# Patient Record
Sex: Male | Born: 1940
Health system: Southern US, Community
[De-identification: ages and names within clinical notes are randomized; demographics above are authoritative.]

## PROBLEM LIST (undated history)

## (undated) DIAGNOSIS — IMO0001 Reserved for inherently not codable concepts without codable children: Secondary | ICD-10-CM

## (undated) DIAGNOSIS — M899 Disorder of bone, unspecified: Secondary | ICD-10-CM

## (undated) DIAGNOSIS — M1990 Unspecified osteoarthritis, unspecified site: Secondary | ICD-10-CM

## (undated) DIAGNOSIS — N179 Acute kidney failure, unspecified: Secondary | ICD-10-CM

## (undated) DIAGNOSIS — F0390 Unspecified dementia without behavioral disturbance: Secondary | ICD-10-CM

## (undated) DIAGNOSIS — G609 Hereditary and idiopathic neuropathy, unspecified: Secondary | ICD-10-CM

## (undated) DIAGNOSIS — E785 Hyperlipidemia, unspecified: Secondary | ICD-10-CM

## (undated) DIAGNOSIS — E559 Vitamin D deficiency, unspecified: Secondary | ICD-10-CM

## (undated) DIAGNOSIS — I10 Essential (primary) hypertension: Secondary | ICD-10-CM

## (undated) DIAGNOSIS — M109 Gout, unspecified: Secondary | ICD-10-CM

## (undated) DIAGNOSIS — F22 Delusional disorders: Secondary | ICD-10-CM

## (undated) DIAGNOSIS — F17200 Nicotine dependence, unspecified, uncomplicated: Secondary | ICD-10-CM

## (undated) DIAGNOSIS — R42 Dizziness and giddiness: Secondary | ICD-10-CM

## (undated) DIAGNOSIS — I959 Hypotension, unspecified: Secondary | ICD-10-CM

## (undated) DIAGNOSIS — D649 Anemia, unspecified: Secondary | ICD-10-CM

## (undated) DIAGNOSIS — R413 Other amnesia: Secondary | ICD-10-CM

## (undated) DIAGNOSIS — E1165 Type 2 diabetes mellitus with hyperglycemia: Secondary | ICD-10-CM

## (undated) DIAGNOSIS — G709 Myoneural disorder, unspecified: Secondary | ICD-10-CM

## (undated) DIAGNOSIS — M6281 Muscle weakness (generalized): Secondary | ICD-10-CM

## (undated) DIAGNOSIS — K219 Gastro-esophageal reflux disease without esophagitis: Secondary | ICD-10-CM

## (undated) DIAGNOSIS — E876 Hypokalemia: Secondary | ICD-10-CM

## (undated) DIAGNOSIS — M949 Disorder of cartilage, unspecified: Secondary | ICD-10-CM

## (undated) DIAGNOSIS — Z136 Encounter for screening for cardiovascular disorders: Secondary | ICD-10-CM

## (undated) HISTORY — DX: Encounter for screening for cardiovascular disorders: Z13.6

## (undated) HISTORY — DX: Type 2 diabetes mellitus with hyperglycemia: E11.65

## (undated) HISTORY — DX: Muscle weakness (generalized): M62.81

## (undated) HISTORY — DX: Anemia, unspecified: D64.9

## (undated) HISTORY — DX: Reserved for inherently not codable concepts without codable children: IMO0001

## (undated) HISTORY — DX: Nicotine dependence, unspecified, uncomplicated: F17.200

## (undated) HISTORY — DX: Disorder of cartilage, unspecified: M94.9

## (undated) HISTORY — DX: Acute kidney failure, unspecified: N17.9

## (undated) HISTORY — DX: Hypotension, unspecified: I95.9

## (undated) HISTORY — DX: Other amnesia: R41.3

## (undated) HISTORY — DX: Hyperlipidemia, unspecified: E78.5

## (undated) HISTORY — DX: Hereditary and idiopathic neuropathy, unspecified: G60.9

## (undated) HISTORY — DX: Hypokalemia: E87.6

## (undated) HISTORY — DX: Dizziness and giddiness: R42

## (undated) HISTORY — DX: Disorder of bone, unspecified: M89.9

## (undated) HISTORY — DX: Vitamin D deficiency, unspecified: E55.9

## (undated) HISTORY — PX: INGUINAL HERNIA REPAIR: SUR1180

## (undated) HISTORY — DX: Gout, unspecified: M10.9

---

## 1999-09-21 ENCOUNTER — Emergency Department (HOSPITAL_COMMUNITY): Admission: EM | Admit: 1999-09-21 | Discharge: 1999-09-21 | Payer: Self-pay | Admitting: Internal Medicine

## 1999-09-23 ENCOUNTER — Encounter: Admission: RE | Admit: 1999-09-23 | Discharge: 1999-12-22 | Payer: Self-pay | Admitting: Family Medicine

## 1999-09-23 ENCOUNTER — Encounter: Payer: Self-pay | Admitting: Family Medicine

## 1999-09-23 ENCOUNTER — Encounter: Admission: RE | Admit: 1999-09-23 | Discharge: 1999-09-23 | Payer: Self-pay | Admitting: Family Medicine

## 2000-09-20 ENCOUNTER — Encounter (HOSPITAL_BASED_OUTPATIENT_CLINIC_OR_DEPARTMENT_OTHER): Payer: Self-pay | Admitting: General Surgery

## 2000-09-20 ENCOUNTER — Ambulatory Visit (HOSPITAL_COMMUNITY): Admission: RE | Admit: 2000-09-20 | Discharge: 2000-09-20 | Payer: Self-pay | Admitting: General Surgery

## 2001-02-13 ENCOUNTER — Emergency Department (HOSPITAL_COMMUNITY): Admission: EM | Admit: 2001-02-13 | Discharge: 2001-02-13 | Payer: Self-pay | Admitting: *Deleted

## 2001-02-27 ENCOUNTER — Ambulatory Visit (HOSPITAL_COMMUNITY): Admission: RE | Admit: 2001-02-27 | Discharge: 2001-02-27 | Payer: Self-pay | Admitting: General Surgery

## 2005-02-22 ENCOUNTER — Emergency Department (HOSPITAL_COMMUNITY): Admission: EM | Admit: 2005-02-22 | Discharge: 2005-02-22 | Payer: Self-pay | Admitting: Emergency Medicine

## 2005-10-16 ENCOUNTER — Observation Stay (HOSPITAL_COMMUNITY): Admission: EM | Admit: 2005-10-16 | Discharge: 2005-10-17 | Payer: Self-pay | Admitting: Emergency Medicine

## 2008-08-11 ENCOUNTER — Encounter: Admission: RE | Admit: 2008-08-11 | Discharge: 2008-08-11 | Payer: Self-pay | Admitting: Family Medicine

## 2009-04-13 ENCOUNTER — Encounter
Admission: RE | Admit: 2009-04-13 | Discharge: 2009-04-13 | Payer: Self-pay | Admitting: Physical Medicine & Rehabilitation

## 2011-04-07 NOTE — H&P (Signed)
Gary Mcneil, Gary Mcneil               ACCOUNT NO.:  [id number]   MEDICAL RECORD NO.:  [id number]          PATIENT TYPE:  INP   LOCATION:  0104                         FACILITY:  [facility]   PHYSICIAN:  Kela Millin, M.D.DATE OF BIRTH:  02-28-1941   DATE OF ADMISSION:  10/16/2005  DATE OF DISCHARGE:                                HISTORY & PHYSICAL   PRIMARY CARE PHYSICIAN:  Dr. Janey Greaser.   CHIEF COMPLAINT:  Chest pain.   HISTORY OF PRESENT ILLNESS:  The patient is a 70 year old black male with  past medical history significant for diabetes mellitus, hypertension,  hypercholesterolemia, peripheral neuropathy who presents with complaints of  chest pain for one day.  He states that about two to three days ago, he  began having right upper extremity pain and this morning the pain migrated  to his chest.  He describes the chest pain has a heaviness in the center  of this chest, although he points to the right side of his chest.  He  reports that the pain is 6-7/10 in intensity, constant.  He states that the  pain in his right arm is worsened by moving the arm.  He denies any  associated shortness of breath.  Also denies nausea, vomiting, and no  diaphoresis.  [name] also denies any trauma and since then he has not  been lifting any heavy weights.  The patient also denies cough, hematemesis,  hematochezia, melena, dysuria, diarrhea.   The patient was seen in the ER and an EKG revealed a sinus tachycardia.  A  CT scan angiogram of his chest was done and it was negative for pulmonary  embolus. His point-of-care enzymes were negative and he is admitted to the  [facility] for further evaluation and management.   PAST MEDICAL HISTORY:  1.  As stated above.  2.  Gout.   MEDICATIONS:  1.  Gabapentin 300 mg p.o. t.i.d.  2.  Alprazolam 0.5 mg at night.  3.  Glipizide 5 mg daily.  4.  Lisinopril 20 mg daily.  5.  Hydrochlorothiazide 25 mg daily.  6.  Allopurinol 300 mg  daily.  7.  Pentoxifylline 400 mg t.i.d.  8.  Acetaminophen t.i.d.  9.  Cilostazol 100 mg b.i.d.  10. Vytorin 10/40 at night.   ALLERGIES:  PENICILLIN.   SOCIAL HISTORY:  He quit tobacco in 1971.  Admits to occasional alcohol.   FAMILY HISTORY:  His sister had MI's beginning in her early 30's/40's.  Mother has diabetes mellitus.  His sisters also have diabetes mellitus.   REVIEW OF SYSTEMS:  The patient admits to an episode of epistaxis a few days  ago, now resolved.  Otherwise as per HPI.   PHYSICAL EXAMINATION:  GENERAL:  The patient is an older black male in no  apparent distress, alert and oriented x3.  VITAL SIGNS:  Blood pressure 135/82, temperature 98.4, pulse 104,  respiratory rate 18, oxygen saturation 98%.  HEENT:  PERRL.  EOMI.  Sclerae anicteric.  Slightly dry mucous membranes.  No exudates.  NECK:  Supple.  No adenopathy.  No thyromegaly.  No JVD appreciated.  LUNGS:  Moderate air movement, no crackles and no wheezes.  CARDIOVASCULAR/CHEST:  Reproducible chest pain over the right precordium  (just to the right of the sternum), tachycardic.  Normal S1 and S2.  Regular  rhythm.  ABDOMEN:  Soft.  Bowel sounds present.  Nontender.  Nondistended.  No  organomegaly.  No masses palpable.  EXTREMITIES:  No cyanosis and no edema.  NEUROLOGIC:  Alert and oriented x3.  Cranial nerves II-XII grossly intact.  Nonfocal exam.   LABORATORY DATA:  His white cell count is 7.9, hemoglobin 12.9, hematocrit  37.3, platelet count 177.  His sodium 133, potassium 3.4, chloride 100, CO2  26, glucose 200, BUN 13, creatinine 1.1, calcium 9.  Point-of-care enzymes  negative and urinalysis clear in appearance.  Urine nitrite is negative.  Leukocyte esterase is negative.  His EKG shows sinus tachycardia at a rate  of 111, no ST-T changes.  CT angiogram showed chest pain musculoskeletal.  No pulmonary embolus.  Mild emphysema.   ASSESSMENT/PLAN:  Problem #1.  Chest pain, musculoskeletal versus  cardiac.  CT angiogram negative for pulmonary embolus.  In this patient with multiple  risk factors, will also obtain serial cardiac enzymes and EKG and monitor on  telemetry.  Chest pain is reproducible on chest palpation, possible  costochondritis/musculoskeletal. Will start NSAIDs as well.  Will also cover  the patient with Protonix.  Follow cardiac enzymes and consider cardiac  consultation in the a.m. for risk stratification. Further evaluation pending  cardiac enzymes.   Problem #2.  Volume depletion.  Will hold hydrochlorothiazide for now,  gently hydrate on recheck.   Problem #3.  Hypertension.  Continue lisinopril.  Hold hydrochlorothiazide  for now as above.   Problem #4.  Diabetes mellitus.  monitoring with sliding scale coverage.  Continue glipizide.   Problem #5.  History of peripheral neuropathy.  Continue current  medications.   Problem #6.  Gout.  Continue allopurinol   Problem #7.  History of hyperlipidemia.  Continue Vytorin.           ______________________________  Kela Millin, M.D.     ACV/MEDQ  D:  10/17/2005  T:  10/17/2005  Job:  [id number]   cc:   Dr. Janey Greaser

## 2011-04-07 NOTE — Discharge Summary (Signed)
NAMEALASSANE, Gary Mcneil               ACCOUNT NO.:  [id number]   MEDICAL RECORD NO.:  [id number]          PATIENT TYPE:  INP   LOCATION:  6524                         FACILITY:  [facility]   PHYSICIAN:  Melissa L. Ladona Ridgel, MD  DATE OF BIRTH:  September 18, 1941   DATE OF ADMISSION:  10/17/2005  DATE OF DISCHARGE:  10/17/2005                                 DISCHARGE SUMMARY   CHIEF COMPLAINT:  Chest pain.   DISCHARGE DIAGNOSIS:  1.  Chest pain.  The patient was scheduled for a cardiac workup, his cardiac      enzymes overnight had been within normal limits suggesting no myocardial      infarction.  The patient, however, refused to stay for further cardiac      workup.  He had called his primary care physician, Dr. Janey Greaser, to      complain about not being seen by Dr. Janey Greaser.  I attempted to speak with      the patient and assist him with understanding the roll of the      hospitalist in his care.  He, however, refused not only for me to see      him but stated that he would be moving his care to a different      physician.  The patient refused to allow me to come and speak with him      and by the time I went to the bedside, the patient had left the floor.  2.  History of gout.   HISTORY OF PRESENT ILLNESS:  The patient is a 70 year old African American  male with a past medical history of diabetes, hypertension,  hypercholesterolemia, peripheral neuropathy, who presented to the emergency  room with chest pain about 2-3 days involving the right upper extremity.  On  the day of admission, the patient said that he had chest pain of 6 to 7 out  of 10 in intensity, was worsened by moving his arm.  He denied shortness of  breath.  He denied nausea and vomiting.  His initial cardiac enzymes were  negative.  As stated, [name] became agitated and decided to leave the  hospital against medical advice despite providing him with the opportunity  to speak to me.   I have reviewed this case with Dr.  Janey Greaser.  At the time of discharge, the  patient is presumed stable without evidence for myocardial infarction.  Again, he left against medical advice.      Melissa L. Ladona Ridgel, MD  Electronically Signed     MLT/MEDQ  D:  11/20/2005  T:  11/20/2005  Job:  [id number]   cc:   Lyndee Leo. Janey Greaser, MD  Fax: [phone]

## 2011-04-07 NOTE — Op Note (Signed)
[facility]  Patient:    Gary Mcneil, Gary Mcneil                      MRN: [id number] Proc. Date: 02/27/01 Adm. Date:  [id number] Disc. Date: [id number] Attending:  Ephriam Knuckles H                           Operative Report  PREOPERATIVE DIAGNOSIS:  Recurrent right inguinal hernia.  POSTOPERATIVE DIAGNOSIS:  Recurrent right inguinal hernia.  OPERATION PERFORMED:  Repair of recurrent right inguinal hernia using mesh.  SURGEON:  Mardene Celeste. Lurene Shadow, M.D.  ASSISTANT:  Marnee Spring. Wiliam Ke, M.D.  ANESTHESIA:  General.  INDICATIONS FOR PROCEDURE:  The patient is a 70 year old man who had a right inguinal hernia repaired in the remote past.  He returns now with a recurrence that is causing some pain.  He is brought to the operating room for repair.  DESCRIPTION OF PROCEDURE:  Following the induction of satisfactory general anesthesia, the patient was positioned supinely.  The lower abdomen prepped and draped routinely.  An oblique incision through the old scar cicatrix was made and deepened through the skin and subcutaneous tissues down to the external oblique aponeurosis.  The external oblique aponeurosis was opened through the external inguinal ring and the ilioinguinal nerve was seen and retracted laterally and inferiorly.  Dissection was carried down so as to mobilize the spermatic cord. This was held up and held with a Penrose drain. I dissected along the spermatic cord up into the region of the internal ring and no indirect sac was noted.  The direct space did show a bulging weakness consistent with a direct hernia.  Where the transversalis fascia repair was done, that was stretched out.  This was repaired with an onlay patch of Prolene mesh sewn in at the pubic tubercle and followed up along the conjoined tendons to the internal ring with a running 2-0 Novofil.  From the pubic tubercle again along the shelving edge of Pouparts ligament up to the internal  ring.  The mesh was split, so as to allow the spermatic cord to protrude through it.  The tails of the mesh were then trimmed and sutured into the internal oblique muscles.  All areas of dissection were then checked for hemostasis, noted to be dry.  Sponge, instrument and sharp counts were verified.  The wound was closed in layers as follows.  The external oblique aponeurosis was closed over the cord with a running suture of 2-0 Vicryl. Scarpas fascia and subcutaneous tissues closed with a running 3-0 Vicryl. Skin was closed with running 4-0 Monocryl and then reinforced with Steri-Strips.  Sterile dressing was applied.  Anesthetic reversed.  Patient removed from the operating room to the recovery room in stable condition having tolerated the procedure well. DD:  02/27/01 TD:  02/27/01 Job: [id number]

## 2011-06-26 ENCOUNTER — Telehealth: Payer: Self-pay | Admitting: *Deleted

## 2011-07-03 NOTE — Telephone Encounter (Signed)
2ND ATTEMPT TO CONTACT PT. LINE IS CONTINUOUSLY  BUSY. TOTAL OF 5 ATTEMPTS

## 2012-02-27 ENCOUNTER — Encounter: Payer: Self-pay | Admitting: Gastroenterology

## 2012-03-28 ENCOUNTER — Other Ambulatory Visit: Payer: Self-pay | Admitting: Internal Medicine

## 2012-03-28 DIAGNOSIS — Z139 Encounter for screening, unspecified: Secondary | ICD-10-CM

## 2012-03-28 DIAGNOSIS — F17200 Nicotine dependence, unspecified, uncomplicated: Secondary | ICD-10-CM

## 2012-04-02 ENCOUNTER — Ambulatory Visit: Payer: Self-pay

## 2012-04-12 ENCOUNTER — Other Ambulatory Visit: Payer: Self-pay

## 2012-04-13 ENCOUNTER — Inpatient Hospital Stay (HOSPITAL_COMMUNITY)
Admission: EM | Admit: 2012-04-13 | Discharge: 2012-05-06 | DRG: 683 | Disposition: A | Discharge status: Home / Self Care | Payer: Medicare Other | Attending: Internal Medicine | Admitting: Internal Medicine

## 2012-04-13 ENCOUNTER — Inpatient Hospital Stay (HOSPITAL_COMMUNITY): Payer: Medicare Other

## 2012-04-13 ENCOUNTER — Encounter (HOSPITAL_COMMUNITY): Payer: Self-pay | Admitting: Internal Medicine

## 2012-04-13 ENCOUNTER — Emergency Department (HOSPITAL_COMMUNITY): Payer: Medicare Other

## 2012-04-13 DIAGNOSIS — F0390 Unspecified dementia without behavioral disturbance: Secondary | ICD-10-CM

## 2012-04-13 DIAGNOSIS — E876 Hypokalemia: Secondary | ICD-10-CM

## 2012-04-13 DIAGNOSIS — M1990 Unspecified osteoarthritis, unspecified site: Secondary | ICD-10-CM

## 2012-04-13 DIAGNOSIS — K5900 Constipation, unspecified: Secondary | ICD-10-CM

## 2012-04-13 DIAGNOSIS — I10 Essential (primary) hypertension: Secondary | ICD-10-CM

## 2012-04-13 DIAGNOSIS — E1169 Type 2 diabetes mellitus with other specified complication: Secondary | ICD-10-CM | POA: Diagnosis present

## 2012-04-13 DIAGNOSIS — N19 Unspecified kidney failure: Secondary | ICD-10-CM

## 2012-04-13 DIAGNOSIS — L0390 Cellulitis, unspecified: Secondary | ICD-10-CM | POA: Diagnosis present

## 2012-04-13 DIAGNOSIS — D696 Thrombocytopenia, unspecified: Secondary | ICD-10-CM

## 2012-04-13 DIAGNOSIS — E1165 Type 2 diabetes mellitus with hyperglycemia: Secondary | ICD-10-CM

## 2012-04-13 DIAGNOSIS — M6282 Rhabdomyolysis: Secondary | ICD-10-CM | POA: Diagnosis present

## 2012-04-13 DIAGNOSIS — E119 Type 2 diabetes mellitus without complications: Secondary | ICD-10-CM

## 2012-04-13 DIAGNOSIS — E160 Drug-induced hypoglycemia without coma: Secondary | ICD-10-CM

## 2012-04-13 DIAGNOSIS — T383X1A Poisoning by insulin and oral hypoglycemic [antidiabetic] drugs, accidental (unintentional), initial encounter: Secondary | ICD-10-CM

## 2012-04-13 DIAGNOSIS — E872 Acidosis, unspecified: Secondary | ICD-10-CM | POA: Diagnosis not present

## 2012-04-13 DIAGNOSIS — M129 Arthropathy, unspecified: Secondary | ICD-10-CM | POA: Diagnosis present

## 2012-04-13 DIAGNOSIS — T383X5A Adverse effect of insulin and oral hypoglycemic [antidiabetic] drugs, initial encounter: Secondary | ICD-10-CM | POA: Diagnosis present

## 2012-04-13 DIAGNOSIS — G609 Hereditary and idiopathic neuropathy, unspecified: Secondary | ICD-10-CM | POA: Diagnosis present

## 2012-04-13 DIAGNOSIS — D649 Anemia, unspecified: Secondary | ICD-10-CM

## 2012-04-13 DIAGNOSIS — N170 Acute kidney failure with tubular necrosis: Principal | ICD-10-CM | POA: Diagnosis present

## 2012-04-13 DIAGNOSIS — E785 Hyperlipidemia, unspecified: Secondary | ICD-10-CM | POA: Insufficient documentation

## 2012-04-13 DIAGNOSIS — M109 Gout, unspecified: Secondary | ICD-10-CM | POA: Diagnosis present

## 2012-04-13 DIAGNOSIS — Z66 Do not resuscitate: Secondary | ICD-10-CM | POA: Diagnosis present

## 2012-04-13 DIAGNOSIS — N179 Acute kidney failure, unspecified: Secondary | ICD-10-CM

## 2012-04-13 DIAGNOSIS — E11649 Type 2 diabetes mellitus with hypoglycemia without coma: Secondary | ICD-10-CM

## 2012-04-13 DIAGNOSIS — R4182 Altered mental status, unspecified: Secondary | ICD-10-CM

## 2012-04-13 DIAGNOSIS — E162 Hypoglycemia, unspecified: Secondary | ICD-10-CM | POA: Diagnosis present

## 2012-04-13 DIAGNOSIS — L02619 Cutaneous abscess of unspecified foot: Secondary | ICD-10-CM | POA: Diagnosis present

## 2012-04-13 DIAGNOSIS — Z79899 Other long term (current) drug therapy: Secondary | ICD-10-CM

## 2012-04-13 DIAGNOSIS — F1010 Alcohol abuse, uncomplicated: Secondary | ICD-10-CM | POA: Diagnosis present

## 2012-04-13 HISTORY — DX: Myoneural disorder, unspecified: G70.9

## 2012-04-13 HISTORY — DX: Unspecified osteoarthritis, unspecified site: M19.90

## 2012-04-13 HISTORY — DX: Essential (primary) hypertension: I10

## 2012-04-13 HISTORY — DX: Unspecified dementia without behavioral disturbance: F03.90

## 2012-04-13 LAB — CBC
Hemoglobin: 12.5 g/dL — ABNORMAL LOW (ref 13.0–17.0)
MCH: 30.9 pg (ref 26.0–34.0)
MCHC: 36.2 g/dL — ABNORMAL HIGH (ref 30.0–36.0)
Platelets: 118 10*3/uL — ABNORMAL LOW (ref 150–400)
RDW: 14.2 % (ref 11.5–15.5)

## 2012-04-13 LAB — COMPREHENSIVE METABOLIC PANEL
ALT: 32 U/L (ref 0–53)
Albumin: 3.7 g/dL (ref 3.5–5.2)
Alkaline Phosphatase: 87 U/L (ref 39–117)
Calcium: 7.7 mg/dL — ABNORMAL LOW (ref 8.4–10.5)
GFR calc Af Amer: 5 mL/min — ABNORMAL LOW (ref >90)
Glucose, Bld: 97 mg/dL (ref 70–99)
Potassium: 3.5 mEq/L (ref 3.5–5.1)
Sodium: 133 mEq/L — ABNORMAL LOW (ref 135–145)
Total Protein: 6.6 g/dL (ref 6.0–8.3)

## 2012-04-13 LAB — GLUCOSE, CAPILLARY
Glucose-Capillary: 108 mg/dL — ABNORMAL HIGH (ref 70–99)
Glucose-Capillary: 74 mg/dL (ref 70–99)

## 2012-04-13 MED ORDER — ONDANSETRON HCL 4 MG/2ML IJ SOLN: 4.0000 mg | Freq: Four times a day (QID) | INTRAMUSCULAR | Status: DC | PRN

## 2012-04-13 MED ORDER — SODIUM CHLORIDE 0.9 % IV SOLN
INTRAVENOUS | Status: DC
  Administered 2012-04-13 – 2012-04-14 (×2): via INTRAVENOUS

## 2012-04-13 MED ORDER — TETANUS-DIPHTH-ACELL PERTUSSIS 5-2.5-18.5 LF-MCG/0.5 IM SUSP
0.5000 mL | Freq: Once | INTRAMUSCULAR | Status: AC
  Administered 2012-04-13: 0.5 mL via INTRAMUSCULAR
  Filled 2012-04-13: qty 0.5

## 2012-04-13 MED ORDER — ACETAMINOPHEN-CODEINE #3 300-30 MG PO TABS
1.0000 | ORAL_TABLET | Freq: Three times a day (TID) | ORAL | Status: DC | PRN
  Administered 2012-04-13 – 2012-05-06 (×26): 1 via ORAL
  Filled 2012-04-13 (×27): qty 1

## 2012-04-13 MED ORDER — SODIUM CHLORIDE 0.9 % IV SOLN
INTRAVENOUS | Status: DC
  Administered 2012-04-13: 20 mL/h via INTRAVENOUS

## 2012-04-13 MED ORDER — ACETAMINOPHEN 650 MG RE SUPP: 650.0000 mg | Freq: Four times a day (QID) | RECTAL | Status: DC | PRN

## 2012-04-13 MED ORDER — GABAPENTIN 600 MG PO TABS
600.0000 mg | ORAL_TABLET | Freq: Four times a day (QID) | ORAL | Status: DC
  Administered 2012-04-13: 600 mg via ORAL
  Filled 2012-04-13 (×6): qty 1

## 2012-04-13 MED ORDER — ONDANSETRON HCL 4 MG PO TABS: 4.0000 mg | ORAL_TABLET | Freq: Four times a day (QID) | ORAL | Status: DC | PRN

## 2012-04-13 MED ORDER — ALLOPURINOL 300 MG PO TABS
300.0000 mg | ORAL_TABLET | Freq: Two times a day (BID) | ORAL | Status: DC
  Administered 2012-04-13 – 2012-04-14 (×2): 300 mg via ORAL
  Filled 2012-04-13 (×3): qty 1

## 2012-04-13 MED ORDER — POTASSIUM CHLORIDE CRYS ER 20 MEQ PO TBCR
20.0000 meq | EXTENDED_RELEASE_TABLET | Freq: Every day | ORAL | Status: DC
  Administered 2012-04-13 – 2012-04-14 (×2): 20 meq via ORAL
  Filled 2012-04-13 (×2): qty 1

## 2012-04-13 MED ORDER — ACETAMINOPHEN 325 MG PO TABS
650.0000 mg | ORAL_TABLET | Freq: Four times a day (QID) | ORAL | Status: DC | PRN
  Administered 2012-04-14 – 2012-05-05 (×23): 650 mg via ORAL
  Filled 2012-04-13 (×23): qty 2

## 2012-04-13 MED ORDER — ATORVASTATIN CALCIUM 20 MG PO TABS
20.0000 mg | ORAL_TABLET | Freq: Every day | ORAL | Status: DC
  Administered 2012-04-14 – 2012-04-15 (×2): 20 mg via ORAL
  Filled 2012-04-13 (×4): qty 1

## 2012-04-13 MED ORDER — ENOXAPARIN SODIUM 30 MG/0.3ML ~~LOC~~ SOLN
30.0000 mg | SUBCUTANEOUS | Status: DC
  Administered 2012-04-14 – 2012-04-22 (×8): 30 mg via SUBCUTANEOUS
  Filled 2012-04-13 (×11): qty 0.3

## 2012-04-13 NOTE — ED Notes (Signed)
Patient transported to CT 

## 2012-04-13 NOTE — ED Notes (Signed)
[facility]- Patient's son found patient lying on the floor with decreased LOC. Initial CBG-33. Patient was given oral glucose-CBG-37 then Dextrose 50% IV given CBG-181. Patient's son states that the patient was recently diagnosed with dementia and has a history of alcoholism. Patient's son found a wine bottle in the house that had not been there previously.

## 2012-04-13 NOTE — ED Provider Notes (Addendum)
History     CSN: [id number]  Arrival date & time 04/13/12  1317   First MD Initiated Contact with Patient 04/13/12 1340      Chief Complaint  Patient presents with  . Hypoglycemia  . Altered Mental Status    (Consider location/radiation/quality/duration/timing/severity/associated sxs/prior treatment) Patient is a 71 y.o. male presenting with altered mental status. The history is provided by the patient and a relative.  Altered Mental Status Associated symptoms include headaches. Pertinent negatives include no chest pain, no abdominal pain and no shortness of breath.  pt with hix niddm, w low blood sugar this today with altered mental status. ems noted blood glucose of 33, gave d50 w improvement in mental status. pts mental status now back at baseline. States this has rarely happened in past, and not recently. Denies recent change in meds/diabetic meds. States compliant w rx. Abrasion to left lower leg. Tetanus unknown. Pt states recent health at baseline, although states headache x 1 week. Gradual onset, constant, c/w prior headaches. No change in headache today. No neck pain or stiffness. No back pain. No chest pain or discomfort. No palpitations. No cough or uri c/o. No fever or chills. No abd pain. No diarrhea. No melena/rectal bleeding. No gu c/o, urinating of normal frequency.     No past medical history on file.  No past surgical history on file.  No family history on file.  History  Substance Use Topics  . Smoking status: Not on file  . Smokeless tobacco: Not on file  . Alcohol Use: Not on file      Review of Systems  Constitutional: Negative for fever and chills.  HENT: Negative for neck pain.   Eyes: Negative for pain and visual disturbance.  Respiratory: Negative for shortness of breath.   Cardiovascular: Negative for chest pain.  Gastrointestinal: Negative for abdominal pain.  Genitourinary: Negative for dysuria and flank pain.  Musculoskeletal: Negative for  back pain.  Skin: Negative for rash.  Neurological: Positive for headaches. Negative for weakness and numbness.  Hematological: Does not bruise/bleed easily.  Psychiatric/Behavioral: Positive for altered mental status. Negative for confusion.    Allergies  Penicillins  Home Medications   Current Outpatient Rx  Name Route Sig Dispense Refill  . ACETAMINOPHEN-CODEINE #3 300-30 MG PO TABS Oral Take 1 tablet by mouth 2 (two) times daily.    . ALLOPURINOL 300 MG PO TABS Oral Take 300 mg by mouth 2 (two) times daily.    [name] GABAPENTIN 600 MG PO TABS Oral Take 600 mg by mouth 4 (four) times daily.    [name] GLIPIZIDE ER 5 MG PO TB24 Oral Take 5 mg by mouth daily.    [name] LISINOPRIL-HYDROCHLOROTHIAZIDE 20-25 MG PO TABS Oral Take 1 tablet by mouth daily.    [name] POTASSIUM CHLORIDE CRYS ER 20 MEQ PO TBCR Oral Take 20 mEq by mouth daily.    [name] ROSUVASTATIN CALCIUM 40 MG PO TABS Oral Take 40 mg by mouth at bedtime.    [name] VITAMIN D (ERGOCALCIFEROL) 50000 UNITS PO CAPS Oral Take 50,000 Units by mouth once a week.      BP 153/121  Pulse 91  Temp(Src) 97.6 F (36.4 C) (Oral)  Resp 20  SpO2 99%  Physical Exam  Nursing note and vitals reviewed. Constitutional: He is oriented to person, place, and time. He appears well-developed and well-nourished. No distress.  HENT:  Head: Atraumatic.  Eyes: Conjunctivae are normal. Pupils are equal, round, and reactive to light. No scleral icterus.  Neck: Normal range of motion. Neck supple. No tracheal deviation present.  Cardiovascular: Normal rate, regular rhythm, normal heart sounds and intact distal pulses.   Pulmonary/Chest: Effort normal and breath sounds normal. No accessory muscle usage. No respiratory distress. He exhibits no tenderness.  Abdominal: Soft. Bowel sounds are normal. He exhibits no distension. There is no tenderness.  Genitourinary:       No cva tenderness  Musculoskeletal: Normal range of motion. He exhibits no edema and no tenderness.       ctls  spine non tender, aligned, no step off. Superficial abrasion to left lower leg and knee. No knee effusion. Knee stable. Good rom without pain. Distal pulses palp. No bony tenderness on bil ext exam.   Neurological: He is alert and oriented to person, place, and time.       Motor intact bil.   Skin: Skin is warm and dry.  Psychiatric: He has a normal mood and affect.    ED Course  Procedures (including critical care time)  Labs Reviewed  GLUCOSE, CAPILLARY - Abnormal; Notable for the following:    Glucose-Capillary 108 (*)    All other components within normal limits  CBC  COMPREHENSIVE METABOLIC PANEL   Results for orders placed during the hospital encounter of 04/13/12  GLUCOSE, CAPILLARY      Component Value Range   Glucose-Capillary 108 (*) 70 - 99 (mg/dL)  CBC      Component Value Range   WBC 6.9  4.0 - 10.5 (K/uL)   RBC 4.05 (*) 4.22 - 5.81 (MIL/uL)   Hemoglobin 12.5 (*) 13.0 - 17.0 (g/dL)   HCT 40.9 (*) 81.1 - 52.0 (%)   MCV 85.2  78.0 - 100.0 (fL)   MCH 30.9  26.0 - 34.0 (pg)   MCHC 36.2 (*) 30.0 - 36.0 (g/dL)   RDW 91.4  78.2 - 95.6 (%)   Platelets 118 (*) 150 - 400 (K/uL)  COMPREHENSIVE METABOLIC PANEL      Component Value Range   Sodium 133 (*) 135 - 145 (mEq/L)   Potassium 3.5  3.5 - 5.1 (mEq/L)   Chloride 93 (*) 96 - 112 (mEq/L)   CO2 20  19 - 32 (mEq/L)   Glucose, Bld 97  70 - 99 (mg/dL)   BUN 60 (*) 6 - 23 (mg/dL)   Creatinine, Ser 21.30 (*) 0.50 - 1.35 (mg/dL)   Calcium 7.7 (*) 8.4 - 10.5 (mg/dL)   Total Protein 6.6  6.0 - 8.3 (g/dL)   Albumin 3.7  3.5 - 5.2 (g/dL)   AST 87 (*) 0 - 37 (U/L)   ALT 32  0 - 53 (U/L)   Alkaline Phosphatase 87  39 - 117 (U/L)   Total Bilirubin 0.4  0.3 - 1.2 (mg/dL)   GFR calc non Af Amer 4 (*) >90 (mL/min)   GFR calc Af Amer 5 (*) >90 (mL/min)  ETHANOL      Component Value Range   Alcohol, Ethyl (B) <11  0 - 11 (mg/dL)  GLUCOSE, CAPILLARY      Component Value Range   Glucose-Capillary 110 (*) 70 - 99 (mg/dL)   Ct  Head Wo Contrast  04/13/2012  *RADIOLOGY REPORT*  Clinical Data: Decreased level of consciousness.  Altered mental status.  Dementia.  CT HEAD WITHOUT CONTRAST  Technique:  Contiguous axial images were obtained from the base of the skull through the vertex without contrast.  Comparison: None.  Findings: There is generalized atrophy.  There is no sign of  focal old or acute infarction.  No mass lesion, hemorrhage, hydrocephalus or extra-axial collection.  There is atherosclerotic change of the major vessels at the base the brain.  The calvarium is unremarkable.  Sinuses are clear.  IMPRESSION: No acute finding.  Generalized atrophy.  Original Report Authenticated By: Thomasenia Sales, M.D.       MDM  Iv ns. Blood glucose check. Pt states had not yet eaten today, po fluids, meal.    Reviewed nursing notes and prior charts for additional history.    Creatinine 10.8, prior cr normal (although last cr in system was from several yrs prior).  Given hypoglycemia on oral agent + new/previously not worked up renal failure - will admit.   [facility] called to admit (pcp is Immunologist care).   Recheck by myself, bp improved from earlier 161/79, no headache. No neuro c/o.   [facility] indicates gmb, team 5.       Suzi Roots, MD 04/13/12 1545  Suzi Roots, MD 04/13/12 [phone]

## 2012-04-13 NOTE — H&P (Signed)
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 His girlfriend noticed that he wasn't quite acting right yesterday and was a little off balance, this afternoon he was confused and fell down, couldn't be quite helped up by family. Subsequently EMS was called and found his CBG to be 33, was treated with dextrose and transferred to the emergency room. Subsequently his mentation has improved back to baseline, and able to ambulate normally as well. Patient reports an episode of vomiting yesterday, he took his regular medicines including his glipizide, recalls eating breakfast this morning. Upon evaluation the emergency room noted to have acute renal failure with a creatinine of 10.7, BUN of 60. He does not recall having kidney problems in the past, girlfriend reports that he had blood work done through his new primary doctor 2 to 3 weeks ago and was told to get an ultrasound as well as stop Aleve, but do not recall hearing about kidney disease. Patient reports using Aleve on and off for the last couple of days. Denies using any other over-the-counter medicines.    Allergies:   Allergies  Allergen Reactions  . Penicillins Rash      Past Medical History  Diagnosis Date  . Diabetes mellitus   . DEMENTIA mild    . Neuromuscular disorder   . Hypertension   Dyslipidemia Gout Peripheral neuropathy  Past Surgical History  Procedure Date  . Hernia repair     Prior to Admission medications   Medication Sig Start Date End Date Taking? Authorizing Provider  acetaminophen-codeine (TYLENOL #3) 300-30 MG per tablet Take 1 tablet by mouth 2 (two) times daily.   Yes Historical Provider, MD  allopurinol (ZYLOPRIM) 300 MG tablet Take 300 mg by mouth 2 (two) times daily.   Yes Historical Provider, MD    gabapentin (NEURONTIN) 600 MG tablet Take 600 mg by mouth 4 (four) times daily.   Yes Historical Provider, MD  glipiZIDE (GLUCOTROL XL) 5 MG 24 hr tablet Take 5 mg by mouth daily.   Yes Historical Provider, MD  lisinopril-hydrochlorothiazide (PRINZIDE,ZESTORETIC) 20-25 MG per tablet Take 1 tablet by mouth daily.   Yes Historical Provider, MD  potassium chloride SA (K-DUR,KLOR-CON) 20 MEQ tablet Take 20 mEq by mouth daily.   Yes Historical Provider, MD  rosuvastatin (CRESTOR) 40 MG tablet Take 40 mg by mouth at bedtime.   Yes Historical Provider, MD  Vitamin D, Ergocalciferol, (DRISDOL) 50000 UNITS CAPS Take 50,000 Units by mouth once a week.   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 No pertinent family history.  Review of Systems:  Constitutional: Denies fever, chills, diaphoresis, appetite change and fatigue.  HEENT: Denies photophobia, eye pain, redness, hearing loss, ear pain, congestion, sore throat, rhinorrhea, sneezing, mouth sores, trouble swallowing, neck pain, neck stiffness and tinnitus.   Respiratory: Denies SOB, DOE, cough, chest tightness,  and wheezing.   Cardiovascular: Denies chest pain, palpitations and leg swelling.  Gastrointestinal: Denies nausea, vomiting, abdominal pain, diarrhea, constipation, blood in stool and abdominal distention.  Genitourinary: Denies dysuria, urgency, frequency, hematuria, flank pain and difficulty urinating.  Musculoskeletal: Denies myalgias, back pain, joint swelling, arthralgias and gait problem.  Skin: Denies pallor,  rash and wound.  Neurological: Denies dizziness, seizures, syncope, weakness, light-headedness, numbness and headaches.  Hematological: Denies adenopathy. Easy bruising, personal or family bleeding history  Psychiatric/Behavioral: Denies suicidal ideation, mood changes,  confusion, nervousness, sleep disturbance and agitation   Physical Exam: Blood pressure 152/65, pulse 88, temperature 97.6 F (36.4 C), temperature source Oral, resp. rate 18, SpO2 98.00%. Gen: AAOX3, no distress HEENT: PERRLA, EOMI, oral mucosa moist and pink CVS:S1S2/RRR, no m/r/g Lungs: CTAB Abd: soft NT, BS present Ext: L foot with slight erythema, edema and small open wound at site of skin biopsy  Labs on Admission:  Results for orders placed during the hospital encounter of 04/13/12 (from the past 48 hour(s))  GLUCOSE, CAPILLARY     Status: Abnormal   Collection Time   04/13/12  1:24 PM      Component Value Range Comment   Glucose-Capillary 108 (*) 70 - 99 (mg/dL)   CBC     Status: Abnormal   Collection Time   04/13/12  2:47 PM      Component Value Range Comment   WBC 6.9  4.0 - 10.5 (K/uL)    RBC 4.05 (*) 4.22 - 5.81 (MIL/uL)    Hemoglobin 12.5 (*) 13.0 - 17.0 (g/dL)    HCT 04.5 (*) 40.9 - 52.0 (%)    MCV 85.2  78.0 - 100.0 (fL)    MCH 30.9  26.0 - 34.0 (pg)    MCHC 36.2 (*) 30.0 - 36.0 (g/dL)    RDW 81.1  91.4 - 78.2 (%)    Platelets 118 (*) 150 - 400 (K/uL)   COMPREHENSIVE METABOLIC PANEL     Status: Abnormal   Collection Time   04/13/12  2:47 PM      Component Value Range Comment   Sodium 133 (*) 135 - 145 (mEq/L)    Potassium 3.5  3.5 - 5.1 (mEq/L)    Chloride 93 (*) 96 - 112 (mEq/L)    CO2 20  19 - 32 (mEq/L)    Glucose, Bld 97  70 - 99 (mg/dL)    BUN 60 (*) 6 - 23 (mg/dL)    Creatinine, Ser 95.62 (*) 0.50 - 1.35 (mg/dL)    Calcium 7.7 (*) 8.4 - 10.5 (mg/dL)    Total Protein 6.6  6.0 - 8.3 (g/dL)    Albumin 3.7  3.5 - 5.2 (g/dL)    AST 87 (*) 0 - 37 (U/L)    ALT 32  0 - 53 (U/L)    Alkaline Phosphatase 87  39 - 117 (U/L)    Total Bilirubin 0.4  0.3 - 1.2 (mg/dL)    GFR calc non Af Amer 4 (*) >90 (mL/min)    GFR calc Af Amer 5 (*) >90 (mL/min)   ETHANOL     Status: Normal   Collection Time   04/13/12  2:47 PM      Component Value Range Comment    Alcohol, Ethyl (B) <11  0 - 11 (mg/dL)   GLUCOSE, CAPILLARY     Status: Abnormal   Collection Time   04/13/12  3:31 PM      Component Value Range Comment   Glucose-Capillary 110 (*) 70 - 99 (mg/dL)     Radiological Exams on Admission: Ct Head Wo Contrast  04/13/2012  *RADIOLOGY REPORT*  Clinical Data: Decreased level of consciousness.  Altered mental status.  Dementia.  CT HEAD WITHOUT CONTRAST  Technique:  Contiguous axial images were obtained from the base of the skull through the vertex  without contrast.  Comparison: None.  Findings: There is generalized atrophy.  There is no sign of focal old or acute infarction.  No mass lesion, hemorrhage, hydrocephalus or extra-axial collection.  There is atherosclerotic change of the major vessels at the base the brain.  The calvarium is unremarkable.  Sinuses are clear.  IMPRESSION: No acute finding.  Generalized atrophy.  Original Report Authenticated By: Thomasenia Sales, M.D.    Assessment/Plan 1.  ARF (acute renal failure): in background of long standing DM/HTN, ACE/diuretic and recent NSAID use Unknown baseline, suspect some underlying CKD Will attempt to get records from [facility], labs done 2-3 weeks ago Place Foley Renal USG Strict I/Os, daily weights SPEP Clinically appears euvolemic to slightly on dry side D/w Renal, IV challenge NS at 150cc/hr today No acute HD indications at this time Start renal diet 2. Hypoglycemia from oral hypoglycemics due to worsening GFR Hold glipizide, improved, check CBGs QACHS If hypoglycemia recurs may need D5W ovenight 3. DM: as above 4. HTN (hypertension) Hold ACE and diuretic, add Amlodipine 5. Early dementia 6. Mild thrombocytopenia: check periph smear to r/o TTP 7. Mild L foot cellulitis following skin biopsy, add doxycycline DVT prophylaxis: Lovenox  Code status d/w pt: DNR   Time Spent on Admission:  Darrien Belter [facility] Pager: [phone] 04/13/2012, 5:16  PM

## 2012-04-13 NOTE — ED Notes (Signed)
[id number]> Expected date:<BR> Expected time: 1:07 PM<BR> Means of arrival:<BR> Comments:<BR> M10 - 71yoM AMS, CBG was low, corrected by EMS

## 2012-04-14 ENCOUNTER — Encounter (HOSPITAL_COMMUNITY): Payer: Self-pay | Admitting: Nephrology

## 2012-04-14 ENCOUNTER — Inpatient Hospital Stay (HOSPITAL_COMMUNITY): Payer: Medicare Other

## 2012-04-14 DIAGNOSIS — M1990 Unspecified osteoarthritis, unspecified site: Secondary | ICD-10-CM | POA: Insufficient documentation

## 2012-04-14 LAB — GLUCOSE, CAPILLARY
Glucose-Capillary: 53 mg/dL — ABNORMAL LOW (ref 70–99)
Glucose-Capillary: 96 mg/dL (ref 70–99)
Glucose-Capillary: 116 mg/dL — ABNORMAL HIGH (ref 70–99)

## 2012-04-14 LAB — CBC
HCT: 30.4 % — ABNORMAL LOW (ref 39.0–52.0)
Hemoglobin: 11.1 g/dL — ABNORMAL LOW (ref 13.0–17.0)
MCH: 31 pg (ref 26.0–34.0)
MCHC: 36.5 g/dL — ABNORMAL HIGH (ref 30.0–36.0)
MCV: 84.9 fL (ref 78.0–100.0)

## 2012-04-14 LAB — COMPREHENSIVE METABOLIC PANEL
Alkaline Phosphatase: 86 U/L (ref 39–117)
BUN: 70 mg/dL — ABNORMAL HIGH (ref 6–23)
GFR calc Af Amer: 4 mL/min — ABNORMAL LOW (ref >90)
Glucose, Bld: 78 mg/dL (ref 70–99)
Potassium: 3.8 mEq/L (ref 3.5–5.1)
Total Protein: 5.8 g/dL — ABNORMAL LOW (ref 6.0–8.3)

## 2012-04-14 LAB — URINALYSIS, ROUTINE W REFLEX MICROSCOPIC
Ketones, ur: NEGATIVE mg/dL
Leukocytes, UA: NEGATIVE
Nitrite: NEGATIVE
Protein, ur: >300 mg/dL — AB
pH: 7 (ref 5.0–8.0)

## 2012-04-14 LAB — URINE MICROSCOPIC-ADD ON

## 2012-04-14 MED ORDER — DOXYCYCLINE HYCLATE 100 MG PO TABS
100.0000 mg | ORAL_TABLET | Freq: Two times a day (BID) | ORAL | Status: DC
  Administered 2012-04-14 – 2012-04-15 (×4): 100 mg via ORAL
  Filled 2012-04-14 (×7): qty 1

## 2012-04-14 MED ORDER — GABAPENTIN 300 MG PO CAPS
600.0000 mg | ORAL_CAPSULE | Freq: Four times a day (QID) | ORAL | Status: DC
  Administered 2012-04-14: 600 mg via ORAL
  Filled 2012-04-14 (×4): qty 2

## 2012-04-14 MED ORDER — SODIUM BICARBONATE 8.4 % IV SOLN
INTRAVENOUS | Status: DC
  Administered 2012-04-14 – 2012-04-15 (×2): via INTRAVENOUS
  Filled 2012-04-14 (×3): qty 150

## 2012-04-14 MED ORDER — ZOLPIDEM TARTRATE 5 MG PO TABS
5.0000 mg | ORAL_TABLET | Freq: Once | ORAL | Status: AC
  Administered 2012-04-14: 5 mg via ORAL
  Filled 2012-04-14: qty 1

## 2012-04-14 MED ORDER — GABAPENTIN 300 MG PO CAPS
300.0000 mg | ORAL_CAPSULE | Freq: Every day | ORAL | Status: DC
  Administered 2012-04-15 – 2012-05-05 (×20): 300 mg via ORAL
  Filled 2012-04-14 (×22): qty 1

## 2012-04-14 MED ORDER — ALLOPURINOL 100 MG PO TABS
100.0000 mg | ORAL_TABLET | Freq: Every day | ORAL | Status: DC
  Administered 2012-04-15 – 2012-05-06 (×22): 100 mg via ORAL
  Filled 2012-04-14 (×22): qty 1

## 2012-04-14 MED FILL — Dextrose Inj 50%: INTRAVENOUS | Qty: 50 | Status: AC

## 2012-04-14 NOTE — Progress Notes (Signed)
CBG 61  Treatment: 15 GM carbohydrate snack  Symptoms: None  Follow-up CBG: Time:2155 CBG Result74  Possible Reasons for Event: Inadequate meal intake  Comments/MD notified:M Lynch NP  .will check cbg again at midnight   York Grice

## 2012-04-14 NOTE — Progress Notes (Addendum)
Subjective: Feels fine, denies any complaints Objective: Vital signs in last 24 hours: Temp:  [97.6 F (36.4 C)-100.8 F (38.2 C)] 100.8 F (38.2 C) (05/26 0525) Pulse Rate:  [88-113] 101  (05/26 0525) Resp:  [18-20] 20  (05/26 0525) BP: (97-153)/(54-121) 97/54 mmHg (05/26 0525) SpO2:  [95 %-99 %] 96 % (05/26 0525) Weight:  [89.7 kg (197 lb 12 oz)-89.8 kg (197 lb 15.6 oz)] 89.8 kg (197 lb 15.6 oz) (05/26 0525) Weight change:  Last BM Date: 04/13/12  Intake/Output from previous day: 05/25 0701 - 05/26 0700 In: 240 [P.O.:240] Out: 100 [Urine:100] Total I/O In: 1232.5 [I.V.:1232.5] Out: -    Physical Exam: Blood pressure 152/65, pulse 88, temperature 97.6 F (36.4 C), temperature source Oral, resp. rate 18, SpO2 98.00%.  Gen: AAOX3, no distress  HEENT: PERRLA, EOMI, oral mucosa moist and pink  CVS:S1S2/RRR, no m/r/g  Lungs: CTAB  Abd: soft NT, BS present  Ext: L foot with improvement in erythema/edema, small open wound at site of skin biopsy No edema  Lab Results: Basic Metabolic Panel:  Basename 04/14/12 0345 04/13/12 1447  NA 132* 133*  K 3.8 3.5  CL 99 93*  CO2 15* 20  GLUCOSE 78 97  BUN 70* 60*  CREATININE 11.93* 10.79*  CALCIUM 6.8* 7.7*  MG -- --  PHOS -- --   Liver Function Tests:  Basename 04/14/12 0345 04/13/12 1447  AST 82* 87*  ALT 27 32  ALKPHOS 86 87  BILITOT 0.4 0.4  PROT 5.8* 6.6  ALBUMIN 3.1* 3.7   No results found for this basename: LIPASE:2,AMYLASE:2 in the last 72 hours No results found for this basename: AMMONIA:2 in the last 72 hours CBC:  Basename 04/14/12 0345 04/13/12 1447  WBC 6.6 6.9  NEUTROABS -- --  HGB 11.1* 12.5*  HCT 30.4* 34.5*  MCV 84.9 85.2  PLT 108* 118*   Cardiac Enzymes: No results found for this basename: CKTOTAL:3,CKMB:3,CKMBINDEX:3,TROPONINI:3 in the last 72 hours BNP: No results found for this basename: PROBNP:3 in the last 72 hours D-Dimer: No results found for this basename: DDIMER:2 in the last 72  hours CBG:  Basename 04/14/12 0758 04/14/12 0729 04/13/12 2353 04/13/12 2154 04/13/12 2127 04/13/12 1531  GLUCAP 84 53* 87 74 61* 110*   Hemoglobin A1C: No results found for this basename: HGBA1C in the last 72 hours Fasting Lipid Panel: No results found for this basename: CHOL,HDL,LDLCALC,TRIG,CHOLHDL,LDLDIRECT in the last 72 hours Thyroid Function Tests: No results found for this basename: TSH,T4TOTAL,FREET4,T3FREE,THYROIDAB in the last 72 hours Anemia Panel: No results found for this basename: VITAMINB12,FOLATE,FERRITIN,TIBC,IRON,RETICCTPCT in the last 72 hours Coagulation: No results found for this basename: LABPROT:2,INR:2 in the last 72 hours Urine Drug Screen: Drugs of Abuse  No results found for this basename: labopia, cocainscrnur, labbenz, amphetmu, thcu, labbarb    Alcohol Level:  Basename 04/13/12 1447  ETH <11   Urinalysis:  Basename 04/14/12 0557  COLORURINE YELLOW  LABSPEC 1.011  PHURINE 7.0  GLUCOSEU NEGATIVE  HGBUR LARGE*  BILIRUBINUR NEGATIVE  KETONESUR NEGATIVE  PROTEINUR >300*  UROBILINOGEN 0.2  NITRITE NEGATIVE  LEUKOCYTESUR NEGATIVE    No results found for this or any previous visit (from the past 240 hour(s)).  Studies/Results: Ct Head Wo Contrast  04/13/2012  *RADIOLOGY REPORT*  Clinical Data: Decreased level of consciousness.  Altered mental status.  Dementia.  CT HEAD WITHOUT CONTRAST  Technique:  Contiguous axial images were obtained from the base of the skull through the vertex without contrast.  Comparison: None.  Findings: There is generalized atrophy.  There is no sign of focal old or acute infarction.  No mass lesion, hemorrhage, hydrocephalus or extra-axial collection.  There is atherosclerotic change of the major vessels at the base the brain.  The calvarium is unremarkable.  Sinuses are clear.  IMPRESSION: No acute finding.  Generalized atrophy.  Original Report Authenticated By: Thomasenia Sales, M.D.   US Renal  04/13/2012  *RADIOLOGY  REPORT*  Clinical Data: Acute renal failure.  Rule out obstruction. Diabetes.  RENAL/URINARY TRACT ULTRASOUND COMPLETE  Comparison:  None.  Findings:  Right Kidney:  Measures 13.3 cm in length.  Echogenicity of the kidney is similar to   the liver.  There is no hydronephrosis.  2.3 x 2.0 x 1.8 cm cyst is seen in the mid pole of the right kidney. 0.9 x 0.7 x 0.9 cm lower pole right renal cyst.  Left Kidney:  Measures 13.0 cm.  Negative for hydronephrosis or evidence of mass.  Echogenicity of the renal cortex is upper normal to slightly increased.  Bladder:  There is normal for degree of distention.  Prostate gland measures approximately 5.7 cm transverse diameter.  There may be a trace amount of ascites in Morison's pouch.  This is not definite.  IMPRESSION:  1.  Negative for hydronephrosis. Two right renal cysts. 2.  Echogenicity of the kidneys is upper normal to slightly increased. Medical renal disease cannot be excluded. 3.  Prostamegaly. 4.  Question a tiny amount of ascites and between the right kidney and liver.  Original Report Authenticated By: Britta Mccreedy, M.D.    Medications: Scheduled Meds:   . allopurinol  100 mg Oral Daily  . atorvastatin  20 mg Oral q1800  . doxycycline  100 mg Oral Q12H  . enoxaparin  30 mg Subcutaneous Q24H  . gabapentin  300 mg Oral QHS  . potassium chloride SA  20 mEq Oral Daily  . TDaP  0.5 mL Intramuscular Once  . DISCONTD: allopurinol  300 mg Oral BID  . DISCONTD: gabapentin  600 mg Oral QID  . DISCONTD: gabapentin  600 mg Oral QID   Continuous Infusions:   .  sodium bicarbonate infusion 1000 mL    . DISCONTD: sodium chloride 20 mL/hr (04/13/12 1418)  . DISCONTD: sodium chloride 150 mL/hr at 04/14/12 0705   PRN Meds:.acetaminophen, acetaminophen, acetaminophen-codeine, ondansetron (ZOFRAN) IV, ondansetron  Assessment/Plan: 1. ARF (acute renal failure): in background of long standing DM/HTN, ACE/diuretic and recent NSAID use  Unknown baseline, suspect  some underlying CKD  requested records from [facility] senior care, labs done 2-3 weeks ago  Adamantly declines foley, oliguric from overnight Renal USG without obstruction Strict I/Os, daily weights  SPEP /UPEP Appreciate Dr.Dunhams' input, will Tx to [facility] IVF changed, bicarb added No acute HD indications at this time  Renal diet  2. Hypoglycemia from oral hypoglycemics due to worsening GFR  Hold glipizide, improved, check CBGs QACHS  IVF changed to D5W with bicarb 3. DM: as above  4. HTN (hypertension)  Hold ACE and diuretic  5. Early dementia  6. Mild thrombocytopenia: may be chronic, check periph smear to r/o TTP, watch closely  7. Mild L foot cellulitis following skin biopsy,improving, low grade fever yesterday, continue doxycycline , check Xray DVT prophylaxis: Lovenox  Code status d/w pt: DNR Tx to [facility], [facility] team 6, renal will follow    LOS: 1 day   [facility] Pager: [phone] 04/14/2012, 11:10 AM

## 2012-04-14 NOTE — Progress Notes (Signed)
CBG:54 at 0740  Treatment: 15 GM carbohydrate snack  Symptoms: Shaky  Follow-up CBG: Time:0800 CBG Result84  Possible Reasons for Event: Other:   Comments/MD notified:C Rama     York Grice

## 2012-04-14 NOTE — Consult Note (Signed)
Reason for Consult: Renal failure Referring Physician: Dr. Jomarie Longs HPI: Gary Mcneil is an 71 y.o. 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  He was admitted via the Emergency Department after being brought in for "not acting right", was found to have profound hypoglycemia (BS 30's).   On presentation he was found to have renal failure with BUN/creatinine 60/10/79.Potassium was normal.  UA showed >300mg % protein, large HB with 3-6 RBC's.  Ultrasound showed 13.3 and 13 cm kidneys, right and left respectively, without hydronephrosis.  He has received IVF at 150 cc/hour overnight with no improvement.  BUN/creatinine today 70/11.9 with recorded UOP 100 cc.  He has refused placement of foley catheter  Patient was on ACE PTA and had been taking Aleve for arthritis pain. No recent creatinine data available but by report his primary care drew labs a few weeks ago (office closed so cannot get those records).  Other recent issues - skin lesion left foot "scraped" per pt (biopsied per notes)  Patient is able to provide history that other than arthritis symptoms "all over his body" that he has not had anorexia, nausea, vomiting, leg swelling, tea or coca colored urine, foaminess to his urine, reduction in UOP, nocturia, difficulty voiding.  Says he takes "18 pills per day" - was able to recall metformin, lisinopril, gabapentin but wasn't sure what any of them were for.  Memory issues.  Pain and swelling in left foot - he is not sure how long.  He IS aware than right now he has significant renal failure and may require dialysis   PMH:   Past Medical History  Diagnosis Date  . Diabetes mellitus 1973  . DEMENTIA   . Neuromuscular disorder   . Hypertension 1973  . Gout   . Arthritis      Past Surgical History  Procedure Date  . Hernia repair    Allergies  Allergen Reactions  . Penicillins  Rash    Medications:   Prior to Admission medications   Medication Sig Start Date End Date Taking? Authorizing Provider  acetaminophen-codeine (TYLENOL #3) 300-30 MG per tablet Take 1 tablet by mouth 2 (two) times daily.   Yes Historical Provider, MD  allopurinol (ZYLOPRIM) 300 MG tablet Take 300 mg by mouth 2 (two) times daily.   Yes Historical Provider, MD  gabapentin (NEURONTIN) 600 MG tablet Take 600 mg by mouth 4 (four) times daily.   Yes Historical Provider, MD  glipiZIDE (GLUCOTROL XL) 5 MG 24 hr tablet Take 5 mg by mouth daily.   Yes Historical Provider, MD  lisinopril-hydrochlorothiazide (PRINZIDE,ZESTORETIC) 20-25 MG per tablet Take 1 tablet by mouth daily.   Yes Historical Provider, MD  potassium chloride SA (K-DUR,KLOR-CON) 20 MEQ tablet Take 20 mEq by mouth daily.   Yes Historical Provider, MD  rosuvastatin (CRESTOR) 40 MG tablet Take 40 mg by mouth at bedtime.   Yes Historical Provider, MD  Vitamin D, Ergocalciferol, (DRISDOL) 50000 UNITS CAPS Take 50,000 Units by mouth once a week.   Yes Historical Provider, MD    Discontinued Meds:   Medications Discontinued During This Encounter  Medication Reason  . 0.9 %  sodium chloride infusion   . gabapentin (NEURONTIN) tablet 600 mg Entry Error    Family History  Problem Relation Age of Onset  . Diabetes Mother   . Diabetes Father   . Diabetes Sister   . Hypertension Mother   .  Hypertension Father   . Kidney disease Father   . Kidney disease Brother     Social History:  reports that he has never smoked. He does not have any smokeless tobacco history on file. He reports that he drinks alcohol. He reports that he does not use illicit drugs.Bachelors in Forensic scientist, Masters in [place], Attended [employer] and [employer].  Retired from [employer] after 40 years.  Enjoys sports events,  Drinks scotch regularly.  ROS: Patient is able to provide history that other than arthritis symptoms "all over his body" that he has not had  anorexia, nausea, vomiting, leg swelling, tea or coca colored urine, foaminess to his urine, reduction in UOP, nocturia, difficulty voiding.  Says he takes "18 pills per day" - was able to recall metformin, lisinopril, gabapentin but wasn't sure what any of them were for.  Memory issues.  Pain and swelling in left foot - he is not sure how long.  Physical Examination: Blood pressure 97/54, pulse 101, temperature 100.8 F (38.2 C), temperature source Oral, resp. rate 20, height 6\' 2"  (1.88 m), weight 89.8 kg (197 lb 15.6 oz), SpO2 96.00%. Very nice tall slender BM.  NAD. Pleasant.   Oriented to person and to place.  Issues recalling events of past couple of days, medicines, recent dates, his doctor;s name, etc. [place]/AT. Slighltly muddy sclerae. No JVD. Lungs clear to auscultation S1S2 regular.  No rub. Abdomen protuberant but no focal tenderness.  Bladder not percussible or palpable. No abdominal bruits heard No pretibial edema.  Excoriations and bruising over both knees related to fall.  Dime sized or smaller lesion left ankle (site of bx) with ankle swelling and increased warmth.  Creatinine, Ser  Date/Time Value Range Status  04/14/2012  3:45 AM 11.93* 0.50-1.35 (mg/dL) Final  04/23/5408  8:11 PM 10.79* 0.50-1.35 (mg/dL) Final   Basic Metabolic Panel:  Basename 04/14/12 0345 04/13/12 1447  NA 132* 133*  K 3.8 3.5  CL 99 93*  CO2 15* 20  GLUCOSE 78 97  BUN 70* 60*  CREATININE 11.93* 10.79*  CALCIUM 6.8* 7.7*  MG -- --  PHOS -- --   Liver Function Tests:  Basename 04/14/12 0345 04/13/12 1447  AST 82* 87*  ALT 27 32  ALKPHOS 86 87  BILITOT 0.4 0.4  PROT 5.8* 6.6  ALBUMIN 3.1* 3.7   CBC:  Basename 04/14/12 0345 04/13/12 1447  WBC 6.6 6.9  NEUTROABS -- --  HGB 11.1* 12.5*  HCT 30.4* 34.5*  MCV 84.9 85.2  PLT 108* 118*    Ref. Range 04/14/2012 05:57  Color, Urine Latest Range: YELLOW  YELLOW  APPearance Latest Range: CLEAR  CLEAR  Specific Gravity, Urine Latest Range:  1.005-1.030  1.011  pH Latest Range: 5.0-8.0  7.0  Glucose, UA Latest Range: NEGATIVE mg/dL NEGATIVE  Bilirubin Urine Latest Range: NEGATIVE  NEGATIVE  Ketones, ur Latest Range: NEGATIVE mg/dL NEGATIVE  Protein Latest Range: NEGATIVE mg/dL >914 (A)  Urobilinogen, UA Latest Range: 0.0-1.0 mg/dL 0.2  Nitrite Latest Range: NEGATIVE  NEGATIVE  Leukocytes, UA Latest Range: NEGATIVE  NEGATIVE  Hgb urine dipstick Latest Range: NEGATIVE  LARGE (A)  Urine-Other No range found AMORPHOUS URATES/PHOSPHATES  WBC, UA Latest Range: <3 WBC/hpf 0-2  RBC / HPF Latest Range: <3 RBC/hpf 3-6  Bacteria, UA Latest Range: RARE  RARE   Ct Head Wo Contrast  04/13/2012  *RADIOLOGY REPORT*  Clinical Data: Decreased level of consciousness.  Altered mental status.  Dementia.  CT HEAD WITHOUT CONTRAST  Technique:  Contiguous axial images were obtained from the base of the skull through the vertex without contrast.  Comparison: None.  Findings: There is generalized atrophy.  There is no sign of focal old or acute infarction.  No mass lesion, hemorrhage, hydrocephalus or extra-axial collection.  There is atherosclerotic change of the major vessels at the base the brain.  The calvarium is unremarkable.  Sinuses are clear.  IMPRESSION: No acute finding.  Generalized atrophy.  Original Report Authenticated By: Thomasenia Sales, M.D.   US Renal  04/13/2012  *RADIOLOGY REPORT*  Clinical Data: Acute renal failure.  Rule out obstruction. Diabetes.  RENAL/URINARY TRACT ULTRASOUND COMPLETE  Comparison:  None.  Findings:  Right Kidney:  Measures 13.3 cm in length.  Echogenicity of the kidney is similar to   the liver.  There is no hydronephrosis.  2.3 x 2.0 x 1.8 cm cyst is seen in the mid pole of the right kidney. 0.9 x 0.7 x 0.9 cm lower pole right renal cyst.  Left Kidney:  Measures 13.0 cm.  Negative for hydronephrosis or evidence of mass.  Echogenicity of the renal cortex is upper normal to slightly increased.  Bladder:  There is normal  for degree of distention.  Prostate gland measures approximately 5.7 cm transverse diameter.  There may be a trace amount of ascites in Morison's pouch.  This is not definite.  IMPRESSION:  1.  Negative for hydronephrosis. Two right renal cysts. 2.  Echogenicity of the kidneys is upper normal to slightly increased. Medical renal disease cannot be excluded. 3.  Prostamegaly. 4.  Question a tiny amount of ascites and between the right kidney and liver.  Original Report Authenticated By: Britta Mccreedy, M.D.     IMPRESSION/RECOMMENDATIONS:  71 y.o. 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 On presentation he was found to have renal failure with BUN/creatinine 60/10.79 with unknown recent baseline, in setting of ACE, Aleve, ETOH use (chronic), found down in the floor.   1. AKI - "presume" acute although we don't really know and most recent labs of about 2-3 wks ago not available until primary MD office reopens.  He clearly has h/o longstanding DM, HT, was on ACE and was taking ALEVE but primary precipitant - if this is all acute - isn't clear.  US shows no obstruction (fortunately - since he refuses foley).     UA with large HB with few RBC's - need to r/o rhabdo.   For completeness will need SPEP, UPEP, comprehensive serologies.(see orders) Although he has no absolute indication for dialysis at this time, would favor moving him to [facility] since renal function has worsened overnight despite IVF and UOP is minimal. He would agree to having dialysis if he needs it.    Save left arm  2. Metabolic acidosis - add to IVF ; change to isotonic bicarb (but slow down rate of IVF since not making any urine to speak of)(see orders)  3. Left ankle lesion with swelling, cellulitic change - primary has  on doxy; would consider xray to r/o osteo  4. Gout - reduce allopurinol dose to 100 given renal function (see orders)  5. DM - per primary  6. Neuropathy - on excessive gabapentin for renal function.  Decrease to 300/day max(see orders)  7. Dementia - unclear  how severe (renal failure confounds assessment) but clearly has some insight  Gary Mcneil 04/14/2012, 9:56 AM

## 2012-04-15 DIAGNOSIS — I10 Essential (primary) hypertension: Secondary | ICD-10-CM

## 2012-04-15 DIAGNOSIS — F0390 Unspecified dementia without behavioral disturbance: Secondary | ICD-10-CM

## 2012-04-15 DIAGNOSIS — E1165 Type 2 diabetes mellitus with hyperglycemia: Secondary | ICD-10-CM

## 2012-04-15 DIAGNOSIS — N179 Acute kidney failure, unspecified: Secondary | ICD-10-CM

## 2012-04-15 DIAGNOSIS — F05 Delirium due to known physiological condition: Secondary | ICD-10-CM

## 2012-04-15 LAB — GLUCOSE, CAPILLARY
Glucose-Capillary: 131 mg/dL — ABNORMAL HIGH (ref 70–99)
Glucose-Capillary: 109 mg/dL — ABNORMAL HIGH (ref 70–99)

## 2012-04-15 LAB — RENAL FUNCTION PANEL
Albumin: 3.1 g/dL — ABNORMAL LOW (ref 3.5–5.2)
Albumin: 3.2 g/dL — ABNORMAL LOW (ref 3.5–5.2)
BUN: 79 mg/dL — ABNORMAL HIGH (ref 6–23)
CO2: 17 mEq/L — ABNORMAL LOW (ref 19–32)
Calcium: 7.6 mg/dL — ABNORMAL LOW (ref 8.4–10.5)
Chloride: 94 mEq/L — ABNORMAL LOW (ref 96–112)
Creatinine, Ser: 14.14 mg/dL — ABNORMAL HIGH (ref 0.50–1.35)
GFR calc Af Amer: 4 mL/min — ABNORMAL LOW (ref >90)
GFR calc non Af Amer: 3 mL/min — ABNORMAL LOW (ref >90)
Phosphorus: 7.1 mg/dL — ABNORMAL HIGH (ref 2.3–4.6)
Potassium: 3.7 mEq/L (ref 3.5–5.1)
Sodium: 136 mEq/L (ref 135–145)

## 2012-04-15 LAB — CBC
HCT: 30.2 % — ABNORMAL LOW (ref 39.0–52.0)
Hemoglobin: 11 g/dL — ABNORMAL LOW (ref 13.0–17.0)
MCH: 30.6 pg (ref 26.0–34.0)
MCH: 30.7 pg (ref 26.0–34.0)
MCHC: 36.4 g/dL — ABNORMAL HIGH (ref 30.0–36.0)
MCV: 83.4 fL (ref 78.0–100.0)
Platelets: DECREASED 10*3/uL (ref 150–400)
RDW: 14.2 % (ref 11.5–15.5)
RDW: 14.1 % (ref 11.5–15.5)
WBC: 6.3 10*3/uL (ref 4.0–10.5)

## 2012-04-15 LAB — HEMOGLOBIN A1C: Mean Plasma Glucose: 154 mg/dL — ABNORMAL HIGH (ref <117)

## 2012-04-15 LAB — PROTEIN / CREATININE RATIO, URINE: Protein Creatinine Ratio: 3.75 — ABNORMAL HIGH (ref 0.00–0.15)

## 2012-04-15 MED ORDER — CIPROFLOXACIN IN D5W 400 MG/200ML IV SOLN
400.0000 mg | INTRAVENOUS | Status: DC
  Administered 2012-04-15 – 2012-04-24 (×10): 400 mg via INTRAVENOUS
  Filled 2012-04-15 (×11): qty 200

## 2012-04-15 MED ORDER — CALCIUM CARBONATE ANTACID 500 MG PO CHEW
1.0000 | CHEWABLE_TABLET | Freq: Three times a day (TID) | ORAL | Status: DC
  Administered 2012-04-15 – 2012-04-22 (×18): 200 mg via ORAL
  Filled 2012-04-15 (×24): qty 1

## 2012-04-15 MED ORDER — SODIUM BICARBONATE 8.4 % IV SOLN
INTRAVENOUS | Status: DC
  Administered 2012-04-15 – 2012-04-18 (×4): via INTRAVENOUS
  Filled 2012-04-15 (×8): qty 50

## 2012-04-15 MED ORDER — CIPROFLOXACIN IN D5W 200 MG/100ML IV SOLN: 200.0000 mg | INTRAVENOUS | Status: DC

## 2012-04-15 MED ORDER — CLINDAMYCIN PHOSPHATE 300 MG/50ML IV SOLN
300.0000 mg | Freq: Three times a day (TID) | INTRAVENOUS | Status: DC
  Administered 2012-04-15 – 2012-04-24 (×26): 300 mg via INTRAVENOUS
  Filled 2012-04-15 (×30): qty 50

## 2012-04-15 NOTE — Progress Notes (Signed)
Subjective: Feels quite well. No new issues.   Objective: Vital signs in last 24 hours: Temp:  [98.2 F (36.8 C)-99.6 F (37.6 C)] 98.2 F (36.8 C) (05/27 0500) Pulse Rate:  [92-96] 92  (05/27 0500) Resp:  [18] 18  (05/27 0500) BP: (148-174)/(78-95) 148/87 mmHg (05/27 0500) SpO2:  [92 %-98 %] 98 % (05/27 0500) Weight change:  Last BM Date: 04/14/12  Intake/Output from previous day: 05/26 0701 - 05/27 0700 In: 1712.5 [P.O.:480; I.V.:1232.5] Out: 0  Total I/O In: 240 [P.O.:240] Out: -    Physical Exam: General: Comfortable, alert, communicative, fully oriented, not short of breath at rest.  HEENT:  No clinical pallor, no jaundice, no conjunctival injection or discharge. Hydration appears fair. NECK:  Supple, JVP not seen, no carotid bruits, no palpable lymphadenopathy, no palpable goiter. CHEST:  Clinically clear to auscultation, no wheezes, no crackles. HEART:  Sounds 1 and 2 heard, normal, regular, no murmurs. ABDOMEN:  Full, soft, non-tender, no palpable organomegaly, no palpable masses, normal bowel sounds. GENITALIA:  Not examined. LOWER EXTREMITIES:  No pitting edema, palpable peripheral pulses. Has abrasions, over left knee and both upper anterior shins. MUSCULOSKELETAL SYSTEM:  Generalized osteoarthritic changes, otherwise, normal. CENTRAL NERVOUS SYSTEM:  No focal neurologic deficit on gross examination.  Lab Results:  Basename 04/15/12 0555 04/14/12 0345  WBC 6.0 6.6  HGB 11.0* 11.1*  HCT 30.2* 30.4*  PLT PLATELET CLUMPS NOTED ON SMEAR, COUNT APPEARS DECREASED 108*    Basename 04/15/12 0555 04/14/12 0345  NA 136 132*  K 3.7 3.8  CL 97 99  CO2 17* 15*  GLUCOSE 98 78  BUN 80* 70*  CREATININE 13.83* 11.93*  CALCIUM 7.6* 6.8*   No results found for this or any previous visit (from the past 240 hour(s)).   Studies/Results: Ct Head Wo Contrast  04/13/2012  *RADIOLOGY REPORT*  Clinical Data: Decreased level of consciousness.  Altered mental status.   Dementia.  CT HEAD WITHOUT CONTRAST  Technique:  Contiguous axial images were obtained from the base of the skull through the vertex without contrast.  Comparison: None.  Findings: There is generalized atrophy.  There is no sign of focal old or acute infarction.  No mass lesion, hemorrhage, hydrocephalus or extra-axial collection.  There is atherosclerotic change of the major vessels at the base the brain.  The calvarium is unremarkable.  Sinuses are clear.  IMPRESSION: No acute finding.  Generalized atrophy.  Original Report Authenticated By: Thomasenia Sales, M.D.   US Renal  04/13/2012  *RADIOLOGY REPORT*  Clinical Data: Acute renal failure.  Rule out obstruction. Diabetes.  RENAL/URINARY TRACT ULTRASOUND COMPLETE  Comparison:  None.  Findings:  Right Kidney:  Measures 13.3 cm in length.  Echogenicity of the kidney is similar to   the liver.  There is no hydronephrosis.  2.3 x 2.0 x 1.8 cm cyst is seen in the mid pole of the right kidney. 0.9 x 0.7 x 0.9 cm lower pole right renal cyst.  Left Kidney:  Measures 13.0 cm.  Negative for hydronephrosis or evidence of mass.  Echogenicity of the renal cortex is upper normal to slightly increased.  Bladder:  There is normal for degree of distention.  Prostate gland measures approximately 5.7 cm transverse diameter.  There may be a trace amount of ascites in Morison's pouch.  This is not definite.  IMPRESSION:  1.  Negative for hydronephrosis. Two right renal cysts. 2.  Echogenicity of the kidneys is upper normal to slightly increased. Medical renal disease  cannot be excluded. 3.  Prostamegaly. 4.  Question a tiny amount of ascites and between the right kidney and liver.  Original Report Authenticated By: Britta Mccreedy, M.D.    Medications: Scheduled Meds:   . allopurinol  100 mg Oral Daily  . atorvastatin  20 mg Oral q1800  . doxycycline  100 mg Oral Q12H  . enoxaparin  30 mg Subcutaneous Q24H  . gabapentin  300 mg Oral QHS  . zolpidem  5 mg Oral Once  .  DISCONTD: allopurinol  300 mg Oral BID  . DISCONTD: gabapentin  600 mg Oral QID  . DISCONTD: potassium chloride SA  20 mEq Oral Daily   Continuous Infusions:   .  sodium bicarbonate infusion 1000 mL 75 mL/hr at 04/15/12 0137  . DISCONTD: sodium chloride 150 mL/hr at 04/14/12 0705   PRN Meds:.acetaminophen, acetaminophen, acetaminophen-codeine, ondansetron (ZOFRAN) IV, ondansetron  Assessment/Plan:  Active Problems:  ARF (acute renal failure): Patient presented with creatine of 10.79 and BUN 60, consistent with ARF. Although he is likely to have a degree of underlying CKD, given his comorbidities of long standing DM/HTN, this is difficult to prove at present, as baseline renal indices are not available. Precipitants, include ACE/diuretic and recent NSAID use, against a background ov vomiting and poor oral intake, pre-admission, likely predisposing ting to dehydration and volume depletion. Renal ultrasound shows no evidence of hydronephrosis, although there was prostatomegaly, and work-up with myeloma screen is in progress. Dr Camille Bal kindly provided renal consultation, and we are managing as recommended, with avoidance of nephrotoxins, iv fluid administration, with Bicarbonate. Unfortunately, rena function has failed to improve so far, and today, creatinine is 13.83, with BUN 80. Dialysis may prove necessary.  2. Hypoglycemic episode: Patient presented with altered mental status/confusion, and fell. Fortunately, he only sustained a few abrasions, but no bony injuries. CBG by EMS, was 33, and patient responded to iv D50W, and has since resolved, with iv infusion of D5W. This was due to continued utilization of oral hypoglycemic therapy, in the face of declining GFR. Glipizide has of course, been placed on hold, and we are following serial CBGs.  3. DM: Known type 2 diabetic. Pre-admssion control is uncertain. Otherwise, see discussion in #2 above. We shall check HBA1C, 4. HTN (hypertension: BP  is sub-optimally controlled at this time. As indicated above, ACE-i and diuretic are on hold, Managing with Amlodipine. We shall not aim for meticulous control at this time, so as not to worsen renal perfusion.  5. Early dementia: Stable.  6. Mild thrombocytopenia: May be chronic, checking periph smear. Clumps are noted in todays's labs. Will need repeating.  7. Mild L foot cellulitis: Following skin biopsy. On day #2 Doxycycline. Improving.    LOS: 2 days   Gary Mcneil,Gary Mcneil 04/15/2012, 9:14 AM

## 2012-04-15 NOTE — Progress Notes (Signed)
[facility] - PROGRESS NOTE Resident Note   Please see below for attending addendum to resident note.  Subjective:   He reports feeling ok. Denies any SOB, chest pain, nausea, vomiting or malaise.  Objective:    Vital Signs:   Temp:  [98.2 F (36.8 C)-99.6 F (37.6 C)] 98.2 F (36.8 C) (05/27 0500) Pulse Rate:  [92-96] 92  (05/27 0500) Resp:  [18] 18  (05/27 0500) BP: (148-174)/(78-95) 148/87 mmHg (05/27 0500) SpO2:  [92 %-98 %] 98 % (05/27 0500) Last BM Date: 04/14/12  24-hour weight change: Weight change:   Weight trends: Filed Weights   04/13/12 1855 04/14/12 0525  Weight: 197 lb 12 oz (89.7 kg) 197 lb 15.6 oz (89.8 kg)    Intake/Output:  05/26 0701 - 05/27 0700 In: 1712.5 [P.O.:480; I.V.:1232.5] Out: 0   Physical Exam: General: Vital signs reviewed and noted. Well-developed, well-nourished, in no acute distress; alert, appropriate and cooperative throughout examination.  Lungs:  Normal respiratory effort. Clear to auscultation BL without crackles or wheezes.  Heart: RRR. S1 and S2 normal without gallop, murmur, or rubs.  Abdomen:  BS normoactive. Soft, Nondistended, non-tender.  No masses or organomegaly.  Extremities: No pretibial edema.     Labs: Basic Metabolic Panel:  Lab 04/15/12 1610 04/14/12 0345 04/13/12 1447  NA 136 132* 133*  K 3.7 3.8 3.5  CL 97 99 93*  CO2 17* 15* 20  GLUCOSE 98 78 97  BUN 80* 70* 60*  CREATININE 13.83* 11.93* 10.79*  CALCIUM 7.6* 6.8* 7.7*  MG -- -- --  PHOS 7.1* -- --    Liver Function Tests:  Lab 04/15/12 0555 04/14/12 0345 04/13/12 1447  AST -- 82* 87*  ALT -- 27 32  ALKPHOS -- 86 87  BILITOT -- 0.4 0.4  PROT -- 5.8* 6.6  ALBUMIN 3.1* 3.1* 3.7   No results found for this basename: LIPASE:5,AMYLASE:5 in the last 168 hours No results found for this basename: AMMONIA:3 in the last 168 hours  CBC:  Lab 04/15/12 0555 04/14/12 0345 04/13/12 1447  WBC 6.0 6.6 6.9  NEUTROABS -- -- --  HGB 11.0*  11.1* 12.5*  HCT 30.2* 30.4* 34.5*  MCV 83.9 84.9 85.2  PLT PLATELET CLUMPS NOTED ON SMEAR, COUNT APPEARS DECREASED 108* 118*    Cardiac Enzymes:  Lab 04/14/12 1200  CKTOTAL 4074*  CKMB --  CKMBINDEX --  TROPONINI --    BNP: No components found with this basename: POCBNP:5  CBG:  Lab 04/15/12 0733 04/14/12 2136 04/14/12 1654 04/14/12 1133 04/14/12 0758  GLUCAP 99 116* 102* 96 84    Microbiology: No results found for this or any previous visit.  Coagulation Studies: No results found for this basename: LABPROT:5,INR:5 in the last 72 hours  Urinalysis:  Basename 04/14/12 0557  COLORURINE YELLOW  LABSPEC 1.011  PHURINE 7.0  GLUCOSEU NEGATIVE  HGBUR LARGE*  BILIRUBINUR NEGATIVE  KETONESUR NEGATIVE  PROTEINUR >300*  UROBILINOGEN 0.2  NITRITE NEGATIVE  LEUKOCYTESUR NEGATIVE      Imaging: Ct Head Wo Contrast  04/13/2012  *RADIOLOGY REPORT*  Clinical Data: Decreased level of consciousness.  Altered mental status.  Dementia.  CT HEAD WITHOUT CONTRAST  Technique:  Contiguous axial images were obtained from the base of the skull through the vertex without contrast.  Comparison: None.  Findings: There is generalized atrophy.  There is no sign of focal old or acute infarction.  No mass lesion, hemorrhage, hydrocephalus or extra-axial collection.  There is atherosclerotic change of the  major vessels at the base the brain.  The calvarium is unremarkable.  Sinuses are clear.  IMPRESSION: No acute finding.  Generalized atrophy.  Original Report Authenticated By: Thomasenia Sales, M.D.   US Renal  04/13/2012  *RADIOLOGY REPORT*  Clinical Data: Acute renal failure.  Rule out obstruction. Diabetes.  RENAL/URINARY TRACT ULTRASOUND COMPLETE  Comparison:  None.  Findings:  Right Kidney:  Measures 13.3 cm in length.  Echogenicity of the kidney is similar to   the liver.  There is no hydronephrosis.  2.3 x 2.0 x 1.8 cm cyst is seen in the mid pole of the right kidney. 0.9 x 0.7 x 0.9 cm lower  pole right renal cyst.  Left Kidney:  Measures 13.0 cm.  Negative for hydronephrosis or evidence of mass.  Echogenicity of the renal cortex is upper normal to slightly increased.  Bladder:  There is normal for degree of distention.  Prostate gland measures approximately 5.7 cm transverse diameter.  There may be a trace amount of ascites in Morison's pouch.  This is not definite.  IMPRESSION:  1.  Negative for hydronephrosis. Two right renal cysts. 2.  Echogenicity of the kidneys is upper normal to slightly increased. Medical renal disease cannot be excluded. 3.  Prostamegaly. 4.  Question a tiny amount of ascites and between the right kidney and liver.  Original Report Authenticated By: Britta Mccreedy, M.D.      Medications:    Infusions:    .  sodium bicarbonate infusion 1000 mL 75 mL/hr at 04/15/12 0137  . DISCONTD: sodium chloride 150 mL/hr at 04/14/12 1610    Scheduled Medications:    . allopurinol  100 mg Oral Daily  . atorvastatin  20 mg Oral q1800  . doxycycline  100 mg Oral Q12H  . enoxaparin  30 mg Subcutaneous Q24H  . gabapentin  300 mg Oral QHS  . zolpidem  5 mg Oral Once  . DISCONTD: allopurinol  300 mg Oral BID  . DISCONTD: gabapentin  600 mg Oral QID  . DISCONTD: potassium chloride SA  20 mEq Oral Daily    PRN Medications: acetaminophen, acetaminophen, acetaminophen-codeine, ondansetron (ZOFRAN) IV, ondansetron   Assessment/ Plan:    71 y.o. 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 On presentation he was found to have renal failure with BUN/creatinine 60/10.79 with unknown recent baseline, in setting of ACE, Aleve, ETOH use (chronic), found down in the floor. He was transferred from [facility] to [facility] in case he needs dialysis with worsening renal  function. 1. AKI - likely secondary to rhabdomyolysis with history of fall, UA showing large blood and given his history of fall. His CK levels were upto 4000 on 4/26. Baseline not known in the absence of  most recent labs of about 2-3 wks ago not available until primary MD office reopens. He clearly has h/o longstanding DM, HT, was on ACE and was taking ALEVE which could have contributed to worsening renal failure with (?) rhabdo from fall. Since he is not developing any uremic symptoms with fall, we anticipate that he does have some underlying CKD.  [place] shws no obstruction (fortunately - since he refuses foley).  - F/U SPEP/UPEP, C3, C4 , ANA. - No absolute indication for HD today. His K is staying low(  3.7), AG is improving with bicarb, no signs of fluid overload on exam and no uremic symptoms this far. ( but if his BUN/Cr continues to trend up - he may need HD for clearence). 2. Metabolic acidosis - His acidosis is improving AG-12. Bicarb is 17 today. - Continue bicarb at 75 cc/hr.May consider increasing the rate 3. HTN/Vol status: BP are slightly elevated with systolic's in 140-150's with last one also in 170's. He was on lisinopril at home( held with AKI).  - continue to monitor for now. 4. Left ankle lesion with swelling, cellulitic change - primary has on doxy. X ray of foot needs to repeated due to poor quality.  Will advise primary to get it repeated. 4. Gout - reduced allopurinol dose to 100 given renal function. 5. DM - per primary  6. Neuropathy - on excessive gabapentin for renal function. Decrease to 300/day max. 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 Start him on binders- calcium carbonate 8 Dementia - unclear how severe (renal failure confounds assessment) but clearly has some insight     Length of Stay: 2 days  Patient history and plan of care reviewed with attending, Dr. Hyman Hopes.   [name], MD  PGYII, Internal Medicine Resident 04/15/2012, 9:13 AM

## 2012-04-15 NOTE — Progress Notes (Signed)
Nursing Note  Pt continues to refuse foley catheter. Will reassess in the morning. C.Morrisa Aldaba, RN.

## 2012-04-15 NOTE — Progress Notes (Signed)
Agree with above continue with the IV bicarbonate for now at 50 cc hour. 2 L up tolerating well. No reason for dialysis despite high Creatinine. Will check baseline. Serologies pending.

## 2012-04-15 NOTE — Progress Notes (Signed)
Comment:   Patient had punch biopsy done on 04/09/12, by podiatrist, for a hyperpigmented lesion. He had some perifocal redness at the time of admission, and today, patient's daughter feels that the foot looks worse.  Examination reveals punch biopsy site, about 0.5 cm diameter, which has an eschar, without purulent exudate. There is still perifocal erythema, although this is mild. Concerning, is that patient's left foot is edematous, and he appears to have some tenderness to flexion/extension of ankle, as well as mid-moderate tenderness to palpation.  Plan:  1. Change antibiotic to iv Clindamycin and Ciprofloxacin. 2. MRI of left foot and ankle.  C. Joncarlos Atkison. MD.

## 2012-04-15 NOTE — Progress Notes (Signed)
ANTIBIOTIC CONSULT NOTE - INITIAL  Pharmacy Consult for Ciprofloxacin Indication: L diabetic foot infection / cellulitis  Allergies  Allergen Reactions  . Penicillins Rash    Patient Measurements: Height: 6\' 2"  (188 cm) Weight: 197 lb 15.6 oz (89.8 kg) IBW/kg (Calculated) : 82.2   Vital Signs: Temp: 98.5 F (36.9 C) (05/27 1400) Temp src: Oral (05/27 1400) BP: 153/79 mmHg (05/27 1400) Pulse Rate: 84  (05/27 1400) Intake/Output from previous day: 05/26 0701 - 05/27 0700 In: 1712.5 [P.O.:480; I.V.:1232.5] Out: 0  Intake/Output from this shift: Total I/O In: 1200 [P.O.:480; I.V.:720] Out: -   Labs:  Basename 04/15/12 1300 04/15/12 1100 04/15/12 0555 04/14/12 0345  WBC -- 6.3 6.0 6.6  HGB -- 11.1* 11.0* 11.1*  PLT -- 117* PLATELET CLUMPS NOTED ON SMEAR, COUNT APPEARS DECREASED 108*  LABCREA 35.80 -- -- --  CREATININE -- 14.14* 13.83* 11.93*   Estimated Creatinine Clearance: 5.7 ml/min (by C-G formula based on Cr of 14.14). No results found for this basename: VANCOTROUGH:2,VANCOPEAK:2,VANCORANDOM:2,GENTTROUGH:2,GENTPEAK:2,GENTRANDOM:2,TOBRATROUGH:2,TOBRAPEAK:2,TOBRARND:2,AMIKACINPEAK:2,AMIKACINTROU:2,AMIKACIN:2, in the last 72 hours   Microbiology: No results found for this or any previous visit (from the past 720 hour(s)).  Medical History: Past Medical History  Diagnosis Date  . Diabetes mellitus   . DEMENTIA   . Neuromuscular disorder   . Hypertension   . Gout   . Arthritis     Assessment: 71 y.o. M to start Ciprofloxacin per Rx and Clindamycin for L foot cellulitis / diabetic foot infection. The patient was admitted with acute renal failure -- SCr 14.14, CrCl <10 ml/min (no UOP charted yesterday or today). The patient has yet to progress to IHD -- however he may require it eventually. Will keep this in mind while dosing.   Goal of Therapy:  Eradication of Infection  Plan:  1. Cipro 400 mg IV every 24 hours 2. Will continue to follow renal function,  culture results, LOT, and antibiotic de-escalation plans   Georgina Pillion, PharmD, BCPS Clinical Pharmacist Pager: [phone] 04/15/2012 5:51 PM

## 2012-04-16 ENCOUNTER — Inpatient Hospital Stay (HOSPITAL_COMMUNITY): Payer: Medicare Other

## 2012-04-16 DIAGNOSIS — F0390 Unspecified dementia without behavioral disturbance: Secondary | ICD-10-CM

## 2012-04-16 DIAGNOSIS — F05 Delirium due to known physiological condition: Secondary | ICD-10-CM

## 2012-04-16 DIAGNOSIS — E1165 Type 2 diabetes mellitus with hyperglycemia: Secondary | ICD-10-CM

## 2012-04-16 DIAGNOSIS — N179 Acute kidney failure, unspecified: Secondary | ICD-10-CM

## 2012-04-16 DIAGNOSIS — I10 Essential (primary) hypertension: Secondary | ICD-10-CM

## 2012-04-16 LAB — KAPPA/LAMBDA LIGHT CHAINS
Kappa free light chain: 9.68 mg/dL — ABNORMAL HIGH (ref 0.33–1.94)
Lambda free light chains: 3.45 mg/dL — ABNORMAL HIGH (ref 0.57–2.63)

## 2012-04-16 LAB — ANTISTREPTOLYSIN O TITER: ASO: <25 IU/mL (ref 0–408)

## 2012-04-16 LAB — CBC
MCH: 29.9 pg (ref 26.0–34.0)
MCV: 82.8 fL (ref 78.0–100.0)
Platelets: 106 10*3/uL — ABNORMAL LOW (ref 150–400)
RDW: 14.2 % (ref 11.5–15.5)

## 2012-04-16 LAB — ANA: Anti Nuclear Antibody(ANA): NEGATIVE

## 2012-04-16 LAB — ANTI-DNA ANTIBODY, DOUBLE-STRANDED: ds DNA Ab: <1 IU/mL (ref <30)

## 2012-04-16 NOTE — Progress Notes (Signed)
I have seen and examined this patient and agree with the plan of care continue to monitor. Will get the results of previous creatinine  Gary Mcneil W 04/16/2012, 11:25 AM

## 2012-04-16 NOTE — Plan of Care (Signed)
We did receive notes from his PCP ( Dr. Sander Radon ) at [name] where he had his labs done on 03/22/12. His BUN was found to be 19 and Cr- 0.90. He just started following up with them and that was his only visit. His records are placed in his chart.  It appears that he has acute renal failure.

## 2012-04-16 NOTE — Progress Notes (Signed)
Subjective: No new issues.   Objective: Vital signs in last 24 hours: Temp:  [98 F (36.7 C)-99.6 F (37.6 C)] 98 F (36.7 C) (05/28 1000) Pulse Rate:  [84-103] 92  (05/28 1000) Resp:  [18-20] 18  (05/28 1000) BP: (135-179)/(79-92) 155/86 mmHg (05/28 1000) SpO2:  [94 %-97 %] 94 % (05/28 1000) Weight:  [99.7 kg (219 lb 12.8 oz)] 99.7 kg (219 lb 12.8 oz) (05/27 2043) Weight change:  Last BM Date: 04/14/12  Intake/Output from previous day: 05/27 0701 - 05/28 0700 In: 2296.7 [P.O.:720; I.V.:1276.7; IV Piggyback:300] Out: -  Total I/O In: -  Out: 211 [Urine:211]   Physical Exam: General: Comfortable, alert, communicative, fully oriented, not short of breath at rest.  HEENT:  No clinical pallor, no jaundice, no conjunctival injection or discharge. Hydration appears fair. NECK:  Supple, JVP not seen, no carotid bruits, no palpable lymphadenopathy, no palpable goiter. CHEST:  Clinically clear to auscultation, no wheezes, no crackles. HEART:  Sounds 1 and 2 heard, normal, regular, no murmurs. ABDOMEN:  Full, soft, non-tender, no palpable organomegaly, no palpable masses, normal bowel sounds. GENITALIA:  Not examined. LOWER EXTREMITIES:  No pitting edema, palpable peripheral pulses. Has abrasions, over left knee and both upper anterior shins. There is a punch biopsy site, about 0.5 cm diameter, which has an eschar, without purulent exudate, and appears to be healing. There is still perifocal erythema, although this is mild and appreciably improved from 04/15/12. Concerning, is that patient's left LE edematous, and tenderness is less.  MUSCULOSKELETAL SYSTEM:  Generalized osteoarthritic changes, otherwise, normal. CENTRAL NERVOUS SYSTEM:  No focal neurologic deficit on gross examination.  Lab Results:  Basename 04/16/12 0512 04/15/12 1100  WBC 5.6 6.3  HGB 9.4* 11.1*  HCT 26.0* 30.2*  PLT 106* 117*    Basename 04/15/12 1100 04/15/12 0555  NA 134* 136  K 3.6 3.7  CL 94* 97  CO2  17* 17*  GLUCOSE 128* 98  BUN 79* 80*  CREATININE 14.14* 13.83*  CALCIUM 7.7* 7.6*   No results found for this or any previous visit (from the past 240 hour(s)).   Studies/Results: Mr Foot Left Wo Contrast  04/16/2012  *RADIOLOGY REPORT*  Clinical Data: Left foot cellulitis  MRI OF THE LEFT FOREFOOT WITHOUT CONTRAST  Technique:  Multiplanar, multisequence MR imaging was performed. No intravenous contrast was administered.  Comparison: Radiographs dated 04/14/2012  Findings: There is no evidence of osteomyelitis or other acute osseous abnormality.  There is deformity of the distal aspect of the proximal phalangeal bone of the little toe, probably due to prior trauma.  There are what appears to be tiny metallic artifacts at that site which not visible on radiographs. This may be related to the prior trauma.  The patient has extensive subcutaneous edema primarily on the dorsum of the foot and around the ankle circumferentially this is nonspecific.  The tendons and ligaments of the ankle and foot demonstrate no significant abnormalities.  No definable abscesses.  IMPRESSION:  1.  No evidence of osteomyelitis or soft tissue abscess. 2.  Extensive subcutaneous edema, nonspecific.  Original Report Authenticated By: Gwynn Burly, M.D.   Dg Foot 2 Views Left  04/15/2012  *RADIOLOGY REPORT*  Clinical Data: Soft tissue wound to the foot.  Cellulitis.  LEFT FOOT - 2 VIEW  Comparison:  None  Findings: There is deformity of the proximal and distal phalangeal bones of the little toe, probably due to remote trauma.  Slight dorsal spurring on the talus and cuneiforms.  Slight arthritis of the first metatarsal phalangeal joint with slight bunion formation.  Slight motion degradation on the AP view.  IMPRESSION: No findings of osteomyelitis. Probable old post-traumatic changes of the little toe.  Arthritic changes as described.  Original Report Authenticated By: Gwynn Burly, M.D.    Medications: Scheduled  Meds:    . allopurinol  100 mg Oral Daily  . calcium carbonate  1 tablet Oral TID WC  . ciprofloxacin  400 mg Intravenous Q24H  . clindamycin (CLEOCIN) IV  300 mg Intravenous Q8H  . enoxaparin  30 mg Subcutaneous Q24H  . gabapentin  300 mg Oral QHS  . DISCONTD: atorvastatin  20 mg Oral q1800  . DISCONTD: ciprofloxacin  200 mg Intravenous Q24H  . DISCONTD: doxycycline  100 mg Oral Q12H   Continuous Infusions:    .  sodium bicarbonate infusion 1000 mL 50 mL/hr at 04/15/12 1431   PRN Meds:.acetaminophen, acetaminophen, acetaminophen-codeine, ondansetron (ZOFRAN) IV, ondansetron  Assessment/Plan:  Active Problems:  ARF (acute renal failure): Patient presented with creatine of 10.79 and BUN 60, consistent with ARF. Although he is likely to have a degree of underlying CKD, given his comorbidities of long standing DM/HTN, this is difficult to prove at present, as baseline renal indices are not available. Precipitants, include ACE/diuretic and recent NSAID use, against a background ov vomiting and poor oral intake, pre-admission, likely predisposing ting to dehydration and volume depletion. Renal ultrasound shows no evidence of hydronephrosis, although there was prostatomegaly, and work-up with myeloma screen is in progress. Dr Camille Bal kindly provided renal consultation, and Dr Hyman Hopes is currently following. We are managing as recommended, with avoidance of nephrotoxins, iv fluid administration, with Bicarbonate. Unfortunately, renal function has failed to improve so far, and today, creatinine is 14.14, with BUN 79. Dialysis may prove necessary.  2. Hypoglycemic episode: Patient presented with altered mental status/confusion, and fell. Fortunately, he only sustained a few abrasions, but no bony injuries. CBG by EMS, was 33, and patient responded to iv D50W, and has since resolved, with iv infusion of D5W. This was due to continued utilization of oral hypoglycemic therapy, in the face of  declining GFR. Glipizide has of course, been placed on hold, and we are following serial CBGs. These have remained normal and stable. 3. DM: Known type 2 diabetic. Pre-admssion control is uncertain. Otherwise, see discussion in #2 above. HBA1C is 7.0. 4. HTN (hypertension: BP is sub-optimally controlled at this time. As indicated above, ACE-i and diuretic are on hold, Managing with Amlodipine, and BP is more reasonable, today. We shall not aim for meticulous control at this time, so as not to worsen renal perfusion.  5. Early dementia: Stable.  6. Mild thrombocytopenia: May be chronic, and has remained stable.  7. Mild Left foot cellulitis: Following skin biopsy, done on 04/09/12. Commenced on iv Ciprofloxacin/Clindamycin on 04/15/12, now day# 2 (Doxycycline was stopped on 04/15/12, after 2 days of therapy). MRI left foot on 04/15/12, showed nonspecific extensive subcutaneous edema, but no evidence of osteomyelitis or soft tissue abscess. Clinically, improvement is already evident. Elevating LLE.  Disposition: Per Renal.   LOS: 3 days   Persis Graffius,CHRISTOPHER 04/16/2012, 1:12 PM

## 2012-04-16 NOTE — Progress Notes (Signed)
[facility] - PROGRESS NOTE Resident Note   Please see below for attending addendum to resident note.  Subjective:   He reports feeling ok. Denies any SOB, chest pain, nausea, vomiting or malaise.  Objective:    Vital Signs:   Temp:  [98 F (36.7 C)-99.6 F (37.6 C)] 98 F (36.7 C) (05/28 0539) Pulse Rate:  [84-103] 88  (05/28 0539) Resp:  [18-20] 18  (05/28 0539) BP: (135-179)/(79-93) 135/80 mmHg (05/28 0539) SpO2:  [94 %-100 %] 95 % (05/28 0539) Weight:  [219 lb 12.8 oz (99.7 kg)] 219 lb 12.8 oz (99.7 kg) (05/27 2043) Last BM Date: 04/14/12  24-hour weight change: Weight change:   Weight trends: Filed Weights   04/13/12 1855 04/14/12 0525 04/15/12 2043  Weight: 197 lb 12 oz (89.7 kg) 197 lb 15.6 oz (89.8 kg) 219 lb 12.8 oz (99.7 kg)    Intake/Output:  05/27 0701 - 05/28 0700 In: 2296.7 [P.O.:720; I.V.:1276.7; IV Piggyback:300] Out: - 100cc reported by nurse since morning + 100cc in bladder scan  Physical Exam: General: Vital signs reviewed and noted. Well-developed, well-nourished, in no acute distress; alert, appropriate and cooperative throughout examination.  Lungs:  Normal respiratory effort. Clear to auscultation BL without crackles or wheezes.  Heart: RRR. S1 and S2 normal without gallop, murmur, or rubs.  Abdomen:  BS normoactive. Soft, Nondistended, non-tender.  No masses or organomegaly.  Extremities: No pretibial edema, left foot swollen with some scars from falling.that appear infectious?     Labs: Basic Metabolic Panel:  Lab 04/15/12 1610 04/15/12 0555 04/14/12 0345 04/13/12 1447  NA 134* 136 132* 133*  K 3.6 3.7 3.8 3.5  CL 94* 97 99 93*  CO2 17* 17* 15* 20  GLUCOSE 128* 98 78 97  BUN 79* 80* 70* 60*  CREATININE 14.14* 13.83* 11.93* 10.79*  CALCIUM 7.7* 7.6* 6.8* --  MG -- -- -- --  PHOS 6.7* 7.1* -- --    Liver Function Tests:  Lab 04/15/12 1100 04/15/12 0555 04/14/12 0345 04/13/12 1447  AST -- -- 82* 87*  ALT -- -- 27  32  ALKPHOS -- -- 86 87  BILITOT -- -- 0.4 0.4  PROT -- -- 5.8* 6.6  ALBUMIN 3.2* 3.1* 3.1* 3.7    CBC:  Lab 04/16/12 0512 04/15/12 1100 04/15/12 0555 04/14/12 0345 04/13/12 1447  WBC 5.6 6.3 6.0 6.6 6.9  NEUTROABS -- -- -- -- --  HGB 9.4* 11.1* 11.0* 11.1* 12.5*  HCT 26.0* 30.2* 30.2* 30.4* 34.5*  MCV 82.8 83.4 83.9 84.9 85.2  PLT 106* 117* PLATELET CLUMPS NOTED ON SMEAR, COUNT APPEARS DECREASED 108* 118*    Cardiac Enzymes:  Lab 04/14/12 1200  CKTOTAL 4074*  CKMB --  CKMBINDEX --  TROPONINI --     CBG:  Lab 04/16/12 0734 04/15/12 2040 04/15/12 1628 04/15/12 1131 04/15/12 0733  GLUCAP 100* 114* 109* 131* 99     Urinalysis:  Basename 04/14/12 0557  COLORURINE YELLOW  LABSPEC 1.011  PHURINE 7.0  GLUCOSEU NEGATIVE  HGBUR LARGE*  BILIRUBINUR NEGATIVE  KETONESUR NEGATIVE  PROTEINUR >300*  UROBILINOGEN 0.2  NITRITE NEGATIVE  LEUKOCYTESUR NEGATIVE      Imaging: Dg Foot 2 Views Left  04/15/2012  *RADIOLOGY REPORT*  Clinical Data: Soft tissue wound to the foot.  Cellulitis.  LEFT FOOT - 2 VIEW  Comparison:  None  Findings: There is deformity of the proximal and distal phalangeal bones of the little toe, probably due to remote trauma.  Slight dorsal spurring on  the talus and cuneiforms.  Slight arthritis of the first metatarsal phalangeal joint with slight bunion formation.  Slight motion degradation on the AP view.  IMPRESSION: No findings of osteomyelitis. Probable old post-traumatic changes of the little toe.  Arthritic changes as described.  Original Report Authenticated By: Gwynn Burly, M.D.      Medications:    Infusions:    .  sodium bicarbonate infusion 1000 mL 50 mL/hr at 04/15/12 1431  . DISCONTD:  sodium bicarbonate infusion 1000 mL 75 mL/hr at 04/15/12 0137    Scheduled Medications:    . allopurinol  100 mg Oral Daily  . atorvastatin  20 mg Oral q1800  . calcium carbonate  1 tablet Oral TID WC  . ciprofloxacin  400 mg Intravenous Q24H    . clindamycin (CLEOCIN) IV  300 mg Intravenous Q8H  . enoxaparin  30 mg Subcutaneous Q24H  . gabapentin  300 mg Oral QHS  . DISCONTD: ciprofloxacin  200 mg Intravenous Q24H  . DISCONTD: doxycycline  100 mg Oral Q12H    PRN Medications: acetaminophen, acetaminophen, acetaminophen-codeine, ondansetron (ZOFRAN) IV, ondansetron   Assessment/ Plan:    71 y.o. 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 On presentation he was found to have renal failure with BUN/creatinine 60/10.79 with unknown recent baseline, in setting of ACE, Aleve, ETOH use (chronic), found down in the floor. He was transferred from [facility] to [facility] in case he needs dialysis with worsening renal function. 1. AKI - likely secondary to rhabdomyolysis with history of fall, UA showing large blood and given his history of fall. His CK levels were upto 4000 on 4/26. Baseline not known in the absence of  most recent labs of about 2-3 wks ago not available until primary MD office reopens. He clearly has h/o longstanding DM, HT, was on ACE and was taking ALEVE which could have contributed to worsening renal failure with (?) rhabdo from fall. UOP is very low- difficult to get accurate assessment as he refuses foley's. Since he is not developing any uremic symptoms with fall, we anticipate that he does have some underlying CKD.  [place] shws no obstruction (fortunately - since he refuses foley).   C3- normal , C4 slightly borderline elevated.. SPEP/UPEP,ANA. Anti- ds DNA are pending. - No absolute indication for HD today. His K is staying low( 3.7), AG is improving with bicarb, no signs of fluid overload on exam and no uremic symptoms this far. ( but if his BUN/Cr continues to trend up - he may need HD for clearence). 2.  Metabolic acidosis - His acidosis is improving AG-13. Bicarb is 17 today. - Continue bicarb at 50 cc/hr 3. HTN/Vol status: BP are slightly elevated with systolic's in 140-150's with last one also in 170's. He was on lisinopril at home( held with AKI).  - continue to monitor for now. 4. Left ankle lesion with swelling, cellulitic change - primary has on doxy. X ray of foot does not show osteomyelitis 4. Gout - reduced allopurinol dose to 100 given renal function. 5. DM - per primary  6. Anemia: Hb dropped to 9.4 today baseline 11.  Check Iron studies  7. Neuropathy - on excessive gabapentin for renal function. Decrease to 300/day max. 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 Start him  on binders- calcium carbonate 9 Dementia - unclear how severe (renal failure confounds assessment) but clearly has some insight     Length of Stay: 3 days  Patient history and plan of care reviewed with attending, Dr. Hyman Hopes.   [name], MD  PGYII, Internal Medicine Resident 04/16/2012, 8:09 AM

## 2012-04-17 DIAGNOSIS — I10 Essential (primary) hypertension: Secondary | ICD-10-CM

## 2012-04-17 DIAGNOSIS — F0390 Unspecified dementia without behavioral disturbance: Secondary | ICD-10-CM

## 2012-04-17 DIAGNOSIS — N179 Acute kidney failure, unspecified: Secondary | ICD-10-CM

## 2012-04-17 DIAGNOSIS — E1165 Type 2 diabetes mellitus with hyperglycemia: Secondary | ICD-10-CM

## 2012-04-17 DIAGNOSIS — F05 Delirium due to known physiological condition: Secondary | ICD-10-CM

## 2012-04-17 LAB — UIFE/LIGHT CHAINS/TP QN, 24-HR UR
Albumin, U: DETECTED
Alpha 1, Urine: DETECTED — AB
Free Kappa/Lambda Ratio: 4.1 ratio (ref 2.04–10.37)
Total Protein, Urine: 84.1 mg/dL

## 2012-04-17 LAB — PROTEIN ELECTROPHORESIS, SERUM
Albumin ELP: 57.9 % (ref 55.8–66.1)
Alpha-1-Globulin: 6.3 % — ABNORMAL HIGH (ref 2.9–4.9)
Alpha-2-Globulin: 14.4 % — ABNORMAL HIGH (ref 7.1–11.8)
Alpha-2-Globulin: 14.5 % — ABNORMAL HIGH (ref 7.1–11.8)
Beta 2: 5.4 % (ref 3.2–6.5)
Beta 2: 5.5 % (ref 3.2–6.5)
Beta Globulin: 6.1 % (ref 4.7–7.2)
Gamma Globulin: 9.7 % — ABNORMAL LOW (ref 11.1–18.8)
Total Protein ELP: 5.5 g/dL — ABNORMAL LOW (ref 6.0–8.3)

## 2012-04-17 LAB — RENAL FUNCTION PANEL
CO2: 18 mEq/L — ABNORMAL LOW (ref 19–32)
Calcium: 8.2 mg/dL — ABNORMAL LOW (ref 8.4–10.5)
Chloride: 87 mEq/L — ABNORMAL LOW (ref 96–112)
Creatinine, Ser: 16.98 mg/dL — ABNORMAL HIGH (ref 0.50–1.35)
GFR calc Af Amer: 3 mL/min — ABNORMAL LOW (ref >90)
Glucose, Bld: 100 mg/dL — ABNORMAL HIGH (ref 70–99)
Sodium: 131 mEq/L — ABNORMAL LOW (ref 135–145)

## 2012-04-17 LAB — GLUCOSE, CAPILLARY: Glucose-Capillary: 117 mg/dL — ABNORMAL HIGH (ref 70–99)

## 2012-04-17 MED ORDER — BISACODYL 5 MG PO TBEC
10.0000 mg | DELAYED_RELEASE_TABLET | Freq: Every day | ORAL | Status: DC | PRN
  Administered 2012-04-21: 10 mg via ORAL
  Filled 2012-04-17: qty 2

## 2012-04-17 MED ORDER — HYDRALAZINE HCL 20 MG/ML IJ SOLN
10.0000 mg | INTRAMUSCULAR | Status: DC | PRN
  Administered 2012-04-18 – 2012-04-20 (×2): 10 mg via INTRAVENOUS
  Filled 2012-04-17 (×3): qty 0.5

## 2012-04-17 MED ORDER — SENNA 8.6 MG PO TABS
1.0000 | ORAL_TABLET | Freq: Every day | ORAL | Status: DC
  Administered 2012-04-18 – 2012-05-05 (×18): 8.6 mg via ORAL
  Filled 2012-04-17 (×21): qty 1

## 2012-04-17 NOTE — H&P (Signed)
Gary Mcneil is an 71 y.o. male.   Chief Complaint: Acute renal failure with possible need for dialysis. Renal service has requested vas cath placement. HPI: See note from nephrology assessment below :   Gary Buddy, MD Physician Signed Nephrology Progress Notes 04/16/2012 8:09 AM  I have seen and examined this patient and agree with the plan of care continue to monitor. Will get the results of previous creatinine  Gary Mcneil 04/16/2012, 11:25 AM     Original Note: Gary Jarvis, MD    [04/16/2012 8:09 AM]      Gary Jarvis, MD Resident Signed Nephrology Progress Notes 04/16/2012 8:09 AM     [name] - PROGRESS NOTE  Resident Note    Please see below for attending addendum to resident note.    Subjective:    He reports feeling ok. Denies any SOB, chest pain, nausea, vomiting or malaise.    Objective:     Vital Signs:                  Temp:  [98 F (36.7 C)-99.6 F (37.6 C)] 98 F (36.7 C) (05/28 0539) Pulse Rate:  [84-103] 88  (05/28 0539) Resp:  [18-20] 18  (05/28 0539) BP: (135-179)/(79-93) 135/80 mmHg (05/28 0539) SpO2:  [94 %-100 %] 95 % (05/28 0539) Weight:  [219 lb 12.8 oz (99.7 kg)] 219 lb 12.8 oz (99.7 kg) (05/27 2043) Last BM Date: 04/14/12  24-hour weight change: Weight change:   Weight trends: Filed Weights     04/13/12 1855  04/14/12 0525  04/15/12 2043   Weight:  197 lb 12 oz (89.7 kg)  197 lb 15.6 oz (89.8 kg)  219 lb 12.8 oz (99.7 kg)     Intake/Output:            05/27 0701 - 05/28 0700 In: 2296.7 [P.O.:720; I.V.:1276.7; IV Piggyback:300] Out: - 100cc reported by nurse since morning + 100cc in bladder scan  Physical Exam: General:  Vital signs reviewed and noted. Well-developed, well-nourished, in no acute distress; alert, appropriate and cooperative throughout examination.   Lungs:   Normal respiratory effort. Clear to auscultation BL without crackles or wheezes.   Heart:  RRR. S1 and S2 normal without gallop, murmur,  or rubs.   Abdomen:   BS normoactive. Soft, Nondistended, non-tender.  No masses or organomegaly.   Extremities:  No pretibial edema, left foot swollen with some scars from falling.that appear infectious?       Labs: Basic Metabolic Panel:   Lab  04/15/12 1100  04/15/12 0555  04/14/12 0345  04/13/12 1447   NA  134*  136  132*  133*   K  3.6  3.7  3.8  3.5   CL  94*  97  99  93*   CO2  17*  17*  15*  20   GLUCOSE  128*  98  78  97   BUN  79*  80*  70*  60*   CREATININE  14.14*  13.83*  11.93*  10.79*   CALCIUM  7.7*  7.6*  6.8*  --   MG  --  --  --  --   PHOS  6.7*  7.1*  --  --     Liver Function Tests:  Lab  04/15/12 1100  04/15/12 0555  04/14/12 0345  04/13/12 1447   AST  --  --  82*  87*   ALT  --  --  27  32  ALKPHOS  --  --  86  87   BILITOT  --  --  0.4  0.4   PROT  --  --  5.8*  6.6   ALBUMIN  3.2*  3.1*  3.1*  3.7     CBC:  Lab  04/16/12 0512  04/15/12 1100  04/15/12 0555  04/14/12 0345  04/13/12 1447   WBC  5.6  6.3  6.0  6.6  6.9   NEUTROABS  --  --  --  --  --   HGB  9.4*  11.1*  11.0*  11.1*  12.5*   HCT  26.0*  30.2*  30.2*  30.4*  34.5*   MCV  82.8  83.4  83.9  84.9  85.2   PLT  106*  117*  PLATELET CLUMPS NOTED ON SMEAR, COUNT APPEARS DECREASED  108*  118*     Cardiac Enzymes:  Lab  04/14/12 1200   CKTOTAL  4074*   CKMB  --   CKMBINDEX  --   TROPONINI  --      CBG:  Lab  04/16/12 0734  04/15/12 2040  04/15/12 1628  04/15/12 1131  04/15/12 0733   GLUCAP  100*  114*  109*  131*  99      Urinalysis:  Basename  04/14/12 0557   COLORURINE  YELLOW   LABSPEC  1.011   PHURINE  7.0   GLUCOSEU  NEGATIVE   HGBUR  LARGE*   BILIRUBINUR  NEGATIVE   KETONESUR  NEGATIVE   PROTEINUR  >300*   UROBILINOGEN  0.2   NITRITE  NEGATIVE   LEUKOCYTESUR  NEGATIVE       Imaging: Dg Foot 2 Views Left  04/15/2012  *RADIOLOGY REPORT*  Clinical Data: Soft tissue wound to the foot.  Cellulitis.  LEFT FOOT - 2 VIEW  Comparison:  None  Findings: There is  deformity of the proximal and distal phalangeal bones of the little toe, probably due to remote trauma.  Slight dorsal spurring on the talus and cuneiforms.  Slight arthritis of the first metatarsal phalangeal joint with slight bunion formation.  Slight motion degradation on the AP view.  IMPRESSION: No findings of osteomyelitis. Probable old post-traumatic changes of the little toe.  Arthritic changes as described.  Original Report Authenticated By: Gwynn Burly, M.D.          Medications:     Infusions:     .   sodium bicarbonate infusion 1000 mL  50 mL/hr at 04/15/12 1431   .  DISCONTD:  sodium bicarbonate infusion 1000 mL  75 mL/hr at 04/15/12 0137     Scheduled Medications:     .  allopurinol   100 mg  Oral  Daily   .  atorvastatin   20 mg  Oral  q1800   .  calcium carbonate   1 tablet  Oral  TID WC   .  ciprofloxacin   400 mg  Intravenous  Q24H   .  clindamycin (CLEOCIN) IV   300 mg  Intravenous  Q8H   .  enoxaparin   30 mg  Subcutaneous  Q24H   .  gabapentin   300 mg  Oral  QHS   .  DISCONTD: ciprofloxacin   200 mg  Intravenous  Q24H   .  DISCONTD: doxycycline   100 mg  Oral  Q12H     PRN Medications: acetaminophen, acetaminophen, acetaminophen-codeine, ondansetron (ZOFRAN) IV, ondansetron     Assessment/ Plan:  71 y.o. 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 On presentation he was found to have renal failure with BUN/creatinine 60/10.79 with unknown recent baseline, in setting of ACE, Aleve, ETOH use (chronic), found down in the floor. He was transferred from [facility] to [facility] in case he needs dialysis with worsening renal function. 1. AKI - likely secondary to rhabdomyolysis with history of fall, UA showing large blood and given his history  of fall. His CK levels were upto 4000 on 4/26. Baseline not known in the absence of  most recent labs of about 2-3 wks ago not available until primary MD office reopens. He clearly has h/o longstanding DM, HT, was on ACE and was taking ALEVE which could have contributed to worsening renal failure with (?) rhabdo from fall. UOP is very low- difficult to get accurate assessment as he refuses foley's. Since he is not developing any uremic symptoms with fall, we anticipate that he does have some underlying CKD.  [place] shws no obstruction (fortunately - since he refuses foley).   C3- normal , C4 slightly borderline elevated.. SPEP/UPEP,ANA. Anti- ds DNA are pending. - No absolute indication for HD today. His K is staying low( 3.7), AG is improving with bicarb, no signs of fluid overload on exam and no uremic symptoms this far. ( but if his BUN/Cr continues to trend up - he may need HD for clearence). 2. Metabolic acidosis - His acidosis is improving AG-13. Bicarb is 17 today. - Continue bicarb at 50 cc/hr 3. HTN/Vol status: BP are slightly elevated with systolic's in 140-150's with last one also in 170's. He was on lisinopril at home( held with AKI).  - continue to monitor for now. 4. Left ankle lesion with swelling, cellulitic change - primary has on doxy. X ray of foot does not show osteomyelitis 4. Gout - reduced allopurinol dose to 100 given renal function. 5. DM - per primary  6. Anemia: Hb dropped to 9.4 today baseline 11.  Check Iron studies   7. Neuropathy - on excessive gabapentin for renal function. Decrease to 300/day max. 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 Start him on binders- calcium carbonate 9 Dementia - unclear how severe (renal failure confounds assessment) but clearly has some insight      Length of Stay: 3 days  Patient history and plan of care reviewed with attending, Dr. Hyman Hopes.   Gary Jarvis, MD  PGYII, Internal Medicine Resident 04/16/2012, 8:09 AM      Cosigned by: Gary Buddy,  MD    [04/16/2012 11:26 AM]        Assessment for vas cath placement  Planned for 04/18/12 :   Social History:  reports that he has never smoked. He does not have any smokeless tobacco history on file. He reports that he drinks alcohol. He reports that he does not use illicit drugs.  Allergies:  Allergies  Allergen Reactions  . Penicillins Rash    Medications Prior to Admission  Medication Sig Dispense Refill  . acetaminophen-codeine (TYLENOL #3) 300-30 MG per tablet Take 1 tablet by mouth 2 (two) times daily.      [name] allopurinol (ZYLOPRIM) 300 MG tablet Take 300 mg by mouth 2 (two) times daily.      [name] gabapentin (NEURONTIN) 600 MG tablet  Take 600 mg by mouth 4 (four) times daily.      [name] glipiZIDE (GLUCOTROL XL) 5 MG 24 hr tablet Take 5 mg by mouth daily.      [name] lisinopril-hydrochlorothiazide (PRINZIDE,ZESTORETIC) 20-25 MG per tablet Take 1 tablet by mouth daily.      . potassium chloride SA (K-DUR,KLOR-CON) 20 MEQ tablet Take 20 mEq by mouth daily.      . rosuvastatin (CRESTOR) 40 MG tablet Take 40 mg by mouth at bedtime.      . Vitamin D, Ergocalciferol, (DRISDOL) 50000 UNITS CAPS Take 50,000 Units by mouth once a week.        Results for orders placed during the hospital encounter of 04/13/12 (from the past 48 hour(s))  GLUCOSE, CAPILLARY     Status: Abnormal   Collection Time   04/15/12  4:28 PM      Component Value Range Comment   Glucose-Capillary 109 (*) 70 - 99 (mg/dL)    Comment 1 Documented in Chart      Comment 2 Notify RN     GLUCOSE, CAPILLARY     Status: Abnormal   Collection Time   04/15/12  8:40 PM      Component Value Range Comment   Glucose-Capillary 114 (*) 70 - 99 (mg/dL)   CBC     Status: Abnormal   Collection Time   04/16/12  5:12 AM      Component Value Range Comment   WBC 5.6  4.0 - 10.5 (K/uL)    RBC 3.14 (*) 4.22 - 5.81 (MIL/uL)    Hemoglobin 9.4 (*) 13.0 - 17.0 (g/dL)    HCT 16.1 (*) 09.6 - 52.0 (%)    MCV 82.8  78.0 - 100.0 (fL)    MCH 29.9   26.0 - 34.0 (pg)    MCHC 36.2 (*) 30.0 - 36.0 (g/dL)    RDW 04.5  40.9 - 81.1 (%)    Platelets 106 (*) 150 - 400 (K/uL) CONSISTENT WITH PREVIOUS RESULT  GLUCOSE, CAPILLARY     Status: Abnormal   Collection Time   04/16/12  7:34 AM      Component Value Range Comment   Glucose-Capillary 100 (*) 70 - 99 (mg/dL)   GLUCOSE, CAPILLARY     Status: Abnormal   Collection Time   04/16/12 11:47 AM      Component Value Range Comment   Glucose-Capillary 151 (*) 70 - 99 (mg/dL)   GLUCOSE, CAPILLARY     Status: Normal   Collection Time   04/16/12  4:35 PM      Component Value Range Comment   Glucose-Capillary 93  70 - 99 (mg/dL)   GLUCOSE, CAPILLARY     Status: Abnormal   Collection Time   04/16/12  9:06 PM      Component Value Range Comment   Glucose-Capillary 109 (*) 70 - 99 (mg/dL)   GLUCOSE, CAPILLARY     Status: Normal   Collection Time   04/17/12  7:50 AM      Component Value Range Comment   Glucose-Capillary 87  70 - 99 (mg/dL)   RENAL FUNCTION PANEL     Status: Abnormal   Collection Time   04/17/12  8:41 AM      Component Value Range Comment   Sodium 131 (*) 135 - 145 (mEq/L)    Potassium 3.7  3.5 - 5.1 (mEq/L)    Chloride 87 (*) 96 - 112 (mEq/L)    CO2 18 (*) 19 - 32 (mEq/L)  Glucose, Bld 100 (*) 70 - 99 (mg/dL)    BUN 92 (*) 6 - 23 (mg/dL)    Creatinine, Ser 54.09 (*) 0.50 - 1.35 (mg/dL)    Calcium 8.2 (*) 8.4 - 10.5 (mg/dL)    Phosphorus 9.7 (*) 2.3 - 4.6 (mg/dL)    Albumin 3.3 (*) 3.5 - 5.2 (g/dL)    GFR calc non Af Amer 2 (*) >90 (mL/min)    GFR calc Af Amer 3 (*) >90 (mL/min)   GLUCOSE, CAPILLARY     Status: Abnormal   Collection Time   04/17/12 11:50 AM      Component Value Range Comment   Glucose-Capillary 113 (*) 70 - 99 (mg/dL)     ROS: denies any CP, SOB, abdominal pain,nausea or vomiting in last 24 hours.  Mentation appears clear and appropriate.  Answers all questions appropriately.   Blood pressure 153/77, pulse 97, temperature 98.9 F (37.2 C), temperature  source Oral, resp. rate 18, height 6\' 2"  (1.88 m), weight 222 lb 14.2 oz (101.1 kg), SpO2 96.00%. Physical Exam : alert and oriented and cooperative. Little fidgety.  Lungs : CTA Bilaterally.  CV: RRR with no murmurs rubs or gallops. No LE edema.  Right arm IV with erythema and tender - RN to evaluate.   Assessment/Plan Procedure for vas cath placement in detail discussed with patient and his girlfriend with his apparent understanding as has already been talked with by nephrology.  Benefits and potential risks including but not limited to infection, bleeding, vessel damage, malfunctioning catheter and complications with moderate sedation discussed with the patient's apparent understanding.  Plan for procedure tomorrow with moderate sedation.   Absalom Aro D 04/17/2012, 2:28 PM

## 2012-04-17 NOTE — Progress Notes (Signed)
Pt mentioned that his headaches usually happen when his blood pressure goes up. The pt's blood pressure has been slightly elevated, however he does not have any anti-hypertensives ordered. I notified Dr. Donna Bernard of this, no orders were given. Will continue to give Tylenol-3 PRN for headaches.

## 2012-04-17 NOTE — Progress Notes (Signed)
Subjective: C/o constipation, also Per nsg pt c/o headache earlier. Chart reviewed. Questioning why he can't go home   Objective: Vital signs in last 24 hours: Temp:  [98.2 F (36.8 C)-99.3 F (37.4 C)] 98.9 F (37.2 C) (05/29 1000) Pulse Rate:  [90-105] 97  (05/29 1000) Resp:  [18-20] 18  (05/29 1000) BP: (153-177)/(64-95) 153/77 mmHg (05/29 1000) SpO2:  [94 %-97 %] 96 % (05/29 1000) Weight:  [101.1 kg (222 lb 14.2 oz)] 101.1 kg (222 lb 14.2 oz) (05/28 2104) Weight change: 1.4 kg (3 lb 1.4 oz) Last BM Date: 04/14/12  Intake/Output from previous day: 05/28 0701 - 05/29 0700 In: 1394.2 [P.O.:480; I.V.:614.2; IV Piggyback:300] Out: 811 [Urine:811] Total I/O In: 240 [P.O.:240] Out: 300 [Urine:300]   Physical Exam: General: alert,Comfortable, communicative, fully oriented, not short of breath at rest.  HEENT:  no conjunctival injection or discharge. Hydration appears fair. NECK:  Supple, JVP not seen, no carotid bruits, no palpable lymphadenopathy, no palpable goiter. CHEST:  Clinically clear to auscultation, no wheezes, no crackles. HEART:  Sounds 1 and 2 heard, normal, regular, no murmurs. ABDOMEN:  Full, soft, non-tender, no palpable organomegaly, no palpable masses, normal bowel sounds. LOWER EXTREMITIES: L(+1-2)>R LE edema Has abrasions, over left knee and both upper anterior shins. There is a punch biopsy site, about 0.5 cm diameter, which has an eschar, without purulent exudate, and appears to be healing. There is still perifocal erythema, although this is mild  CENTRAL NERVOUS SYSTEM:  No focal neurologic deficit on gross examination.  Lab Results:  Basename 04/16/12 0512 04/15/12 1100  WBC 5.6 6.3  HGB 9.4* 11.1*  HCT 26.0* 30.2*  PLT 106* 117*    Basename 04/17/12 0841 04/15/12 1100  NA 131* 134*  K 3.7 3.6  CL 87* 94*  CO2 18* 17*  GLUCOSE 100* 128*  BUN 92* 79*  CREATININE 16.98* 14.14*  CALCIUM 8.2* 7.7*   No results found for this or any previous visit  (from the past 240 hour(s)).   Studies/Results: Mr Foot Left Wo Contrast  04/16/2012  *RADIOLOGY REPORT*  Clinical Data: Left foot cellulitis  MRI OF THE LEFT FOREFOOT WITHOUT CONTRAST  Technique:  Multiplanar, multisequence MR imaging was performed. No intravenous contrast was administered.  Comparison: Radiographs dated 04/14/2012  Findings: There is no evidence of osteomyelitis or other acute osseous abnormality.  There is deformity of the distal aspect of the proximal phalangeal bone of the little toe, probably due to prior trauma.  There are what appears to be tiny metallic artifacts at that site which not visible on radiographs. This may be related to the prior trauma.  The patient has extensive subcutaneous edema primarily on the dorsum of the foot and around the ankle circumferentially this is nonspecific.  The tendons and ligaments of the ankle and foot demonstrate no significant abnormalities.  No definable abscesses.  IMPRESSION:  1.  No evidence of osteomyelitis or soft tissue abscess. 2.  Extensive subcutaneous edema, nonspecific.  Original Report Authenticated By: Gwynn Burly, M.D.    Medications: Scheduled Meds:    . allopurinol  100 mg Oral Daily  . calcium carbonate  1 tablet Oral TID WC  . ciprofloxacin  400 mg Intravenous Q24H  . clindamycin (CLEOCIN) IV  300 mg Intravenous Q8H  . enoxaparin  30 mg Subcutaneous Q24H  . gabapentin  300 mg Oral QHS   Continuous Infusions:    .  sodium bicarbonate infusion 1000 mL 50 mL/hr at 04/16/12 1820   PRN  Meds:.acetaminophen, acetaminophen, acetaminophen-codeine, ondansetron (ZOFRAN) IV, ondansetron  Assessment/Plan:  Active Problems:  ARF (acute renal failure): Patient presented with creatine of 10.79 and BUN 60, consistent with ARF. Although he is likely to have a degree of underlying CKD, given his comorbidities of long standing DM/HTN, this is difficult to prove at present, as baseline renal indices are not available.  Precipitants, include ACE/diuretic and recent NSAID use, against a background ov vomiting and poor oral intake, pre-admission, likely predisposing ting to dehydration and volume depletion. Renal ultrasound shows no evidence of hydronephrosis, although there was prostatomegaly, and work-up with myeloma screen is in progress. Dr Camille Bal kindly provided renal consultation, and Dr Hyman Hopes is currently following. We are managing as recommended, with avoidance of nephrotoxins, iv fluid administration, with Bicarbonate. Unfortunately, renal function has failed to improve so far, and today 5/29 continuing to trend up-creatinine is 16.98, with BUN 92. Dialysis may prove necessary- Renal to dtermine the timing - dialysis catheter to be place as per Dr Marland Mcalpine note  - answered his questions/concerns and he voiced understanding 2. Hypoglycemic episode: Patient presented with altered mental status/confusion, and fell. Fortunately, he only sustained a few abrasions, but no bony injuries. CBG by EMS, was 33, and patient responded to iv D50W, and has since resolved, with iv infusion of D5W. This was due to continued utilization of oral hypoglycemic therapy, in the face of declining GFR. Glipizide remains on hold,  and serial CBGs  Are being monitored. BG remains stable today. 3. DM: Known type 2 diabetic. Pre-admssion control is uncertain. Otherwise, see discussion in #2 above. HBA1C is 7.0. - BG controlled today 4. HTN, Uncontrolled. ACE-i and diuretic are on hold, BP markedly elevated today, will add prn Hydralazine not aiming for meticulous control at this time, so as not to worsen renal perfusion. But if on f/u in am BP still significantly elevated- will add norvasc 5. Early dementia: Stable.  6. Mild thrombocytopenia: May be chronic, and has remained stable.  7. Mild Left foot cellulitis: Following skin biopsy, done on 04/09/12. Commenced on iv Ciprofloxacin/Clindamycin on 04/15/12, now day# 2 (Doxycycline was  stopped on 04/15/12, after 2 days of therapy). MRI left foot on 04/15/12, showed nonspecific extensive subcutaneous edema, but no evidence of osteomyelitis or soft tissue abscess.  -LLE significantly more edematous than R. As above, will obtain doppler and follow - continue abx as above 8 Constipation- senokot/dulcolax and follow Disposition: Per Renal.   LOS: 4 days   Harneet Noblett C 04/17/2012, 1:44 PM

## 2012-04-17 NOTE — Progress Notes (Signed)
I have seen and examined this patient and agree with the plan of care no uremic symptoms but will most likely need dialysis at some stage. We will have a temporary catheter placed.  Gary Mcneil W 04/17/2012, 11:14 AM

## 2012-04-17 NOTE — Progress Notes (Signed)
[facility] - PROGRESS NOTE Resident Note   Please see below for attending addendum to resident note.  Subjective:   He had some headaches this AM and says that they have started to subside. Denies any chest pain, SOB, nausea or vomiting.  Objective:    Vital Signs:   Temp:  [98 F (36.7 C)-99.3 F (37.4 C)] 99.2 F (37.3 C) (05/29 0535) Pulse Rate:  [90-105] 90  (05/29 0535) Resp:  [18-20] 20  (05/29 0535) BP: (155-177)/(64-95) 157/64 mmHg (05/29 0535) SpO2:  [94 %-97 %] 97 % (05/29 0535) Weight:  [222 lb 14.2 oz (101.1 kg)] 222 lb 14.2 oz (101.1 kg) (05/28 2104) Last BM Date: 04/14/12  24-hour weight change: Weight change: 3 lb 1.4 oz (1.4 kg)  Weight trends: Filed Weights   04/14/12 0525 04/15/12 2043 04/16/12 2104  Weight: 197 lb 15.6 oz (89.8 kg) 219 lb 12.8 oz (99.7 kg) 222 lb 14.2 oz (101.1 kg)    Intake/Output:  05/28 0701 - 05/29 0700 In: 1394.2 [P.O.:480; I.V.:614.2; IV Piggyback:300] Out: 811 [Urine:811]  Physical Exam: General: Vital signs reviewed and noted. Well-developed, well-nourished, in no acute distress; alert, appropriate and cooperative throughout examination.  Lungs:  Normal respiratory effort. Clear to auscultation BL without crackles or wheezes.  Heart: RRR. S1 and S2 normal without gallop, murmur, or rubs.  Abdomen:  BS normoactive. Soft, Nondistended, non-tender.  No masses or organomegaly.  Extremities: No pretibial edema, left foot swollen with some scars from falling.that appear infectious?     Labs: Basic Metabolic Panel:  Lab 04/15/12 9604 04/15/12 0555 04/14/12 0345 04/13/12 1447  NA 134* 136 132* 133*  K 3.6 3.7 3.8 3.5  CL 94* 97 99 93*  CO2 17* 17* 15* 20  GLUCOSE 128* 98 78 97  BUN 79* 80* 70* 60*  CREATININE 14.14* 13.83* 11.93* 10.79*  CALCIUM 7.7* 7.6* 6.8* --  MG -- -- -- --  PHOS 6.7* 7.1* -- --    Liver Function Tests:  Lab 04/15/12 1100 04/15/12 0555 04/14/12 0345 04/13/12 1447  AST -- -- 82*  87*  ALT -- -- 27 32  ALKPHOS -- -- 86 87  BILITOT -- -- 0.4 0.4  PROT -- -- 5.8* 6.6  ALBUMIN 3.2* 3.1* 3.1* 3.7    CBC:  Lab 04/16/12 0512 04/15/12 1100 04/15/12 0555 04/14/12 0345 04/13/12 1447  WBC 5.6 6.3 6.0 6.6 6.9  NEUTROABS -- -- -- -- --  HGB 9.4* 11.1* 11.0* 11.1* 12.5*  HCT 26.0* 30.2* 30.2* 30.4* 34.5*  MCV 82.8 83.4 83.9 84.9 85.2  PLT 106* 117* PLATELET CLUMPS NOTED ON SMEAR, COUNT APPEARS DECREASED 108* 118*    Cardiac Enzymes:  Lab 04/14/12 1200  CKTOTAL 4074*  CKMB --  CKMBINDEX --  TROPONINI --     CBG:  Lab 04/17/12 0750 04/16/12 2106 04/16/12 1635 04/16/12 1147 04/16/12 0734  GLUCAP 87 109* 93 151* 100*      Imaging: Mr Foot Left Wo Contrast  04/16/2012  *RADIOLOGY REPORT*  Clinical Data: Left foot cellulitis  MRI OF THE LEFT FOREFOOT WITHOUT CONTRAST  Technique:  Multiplanar, multisequence MR imaging was performed. No intravenous contrast was administered.  Comparison: Radiographs dated 04/14/2012  Findings: There is no evidence of osteomyelitis or other acute osseous abnormality.  There is deformity of the distal aspect of the proximal phalangeal bone of the little toe, probably due to prior trauma.  There are what appears to be tiny metallic artifacts at that site which not visible on  radiographs. This may be related to the prior trauma.  The patient has extensive subcutaneous edema primarily on the dorsum of the foot and around the ankle circumferentially this is nonspecific.  The tendons and ligaments of the ankle and foot demonstrate no significant abnormalities.  No definable abscesses.  IMPRESSION:  1.  No evidence of osteomyelitis or soft tissue abscess. 2.  Extensive subcutaneous edema, nonspecific.  Original Report Authenticated By: Gwynn Burly, M.D.      Medications:    Infusions:    .  sodium bicarbonate infusion 1000 mL 50 mL/hr at 04/16/12 1820    Scheduled Medications:    . allopurinol  100 mg Oral Daily  . calcium  carbonate  1 tablet Oral TID WC  . ciprofloxacin  400 mg Intravenous Q24H  . clindamycin (CLEOCIN) IV  300 mg Intravenous Q8H  . enoxaparin  30 mg Subcutaneous Q24H  . gabapentin  300 mg Oral QHS  . DISCONTD: atorvastatin  20 mg Oral q1800    PRN Medications: acetaminophen, acetaminophen, acetaminophen-codeine, ondansetron (ZOFRAN) IV, ondansetron   Assessment/ Plan:    71 y.o. 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 On presentation he was found to have renal failure with BUN/creatinine 60/10.79 with unknown recent baseline, in setting of ACE, Aleve, ETOH use (chronic), found down in the floor. He was transferred from [facility] to [facility] in case he needs dialysis with worsening renal function.  1. AKI - likely secondary to rhabdomyolysis with history of fall, UA showing large blood and given his history of fall. His CK levels were upto 4000 on 4/26. His baseline Cr was 0.90 on 03/22/12 as per records obtained from PCP. He clearly has h/o longstanding DM, HT, was on ACE and was taking ALEVE which could have contributed to worsening renal failure with (?) rhabdo from fall. UOP is fairly good ~800 cc in last 24 hrs.  [place] shws no obstruction (fortunately - since he refuses foley). Protein/Cr ration is 3.75. Urine Kappa and lamda chains are elevated but ratio is normal( 4.10). Serum kappa ad lamda chains are elevated with ratio of 2.81.  -C3- normal , C4 slightly borderline elevated.ANA and Anti- ds DNA  is negative.  - No absolute indication for HD today. His K is staying low( 3.7), AG is improving with bicarb, no signs of fluid overload on exam and no uremic symptoms this far. ( but if his BUN/Cr continues to trend up - he may need HD for clearence). 2. Metabolic acidosis - His  acidosis is improving AG-13. Bicarb is 17 today. - Continue bicarb at 50 cc/hr 3. HTN/Vol status: BP are slightly elevated with systolic's in 140-150's with last one also in 170's. He was on lisinopril at home( held with AKI).  - continue to monitor for now. 4. Left ankle lesion with swelling, cellulitic change - primary has on clindamycin and ciprofloxacin. X ray of foot does not show osteomyelitis 4. Gout - reduced allopurinol dose to 100 given renal function. 5. DM - per primary  6. Anemia: Hb dropped to 9.4 today baseline 11.  Check Iron studies  7. Neuropathy - on excessive gabapentin for renal function. Decrease to 300/day max. 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 Start him on binders- calcium carbonate 9 Dementia - unclear how  severe (renal failure confounds assessment) but clearly has some insight     Length of Stay: 4 days  Patient history and plan of care reviewed with attending, Dr. Hyman Hopes.   [name], MD  PGYII, Internal Medicine Resident 04/17/2012, 8:17 AM

## 2012-04-18 ENCOUNTER — Inpatient Hospital Stay (HOSPITAL_COMMUNITY): Payer: Medicare Other

## 2012-04-18 ENCOUNTER — Ambulatory Visit: Payer: Self-pay

## 2012-04-18 DIAGNOSIS — M7989 Other specified soft tissue disorders: Secondary | ICD-10-CM

## 2012-04-18 DIAGNOSIS — I10 Essential (primary) hypertension: Secondary | ICD-10-CM

## 2012-04-18 DIAGNOSIS — N179 Acute kidney failure, unspecified: Secondary | ICD-10-CM

## 2012-04-18 DIAGNOSIS — F05 Delirium due to known physiological condition: Secondary | ICD-10-CM

## 2012-04-18 DIAGNOSIS — F0390 Unspecified dementia without behavioral disturbance: Secondary | ICD-10-CM

## 2012-04-18 DIAGNOSIS — E1165 Type 2 diabetes mellitus with hyperglycemia: Secondary | ICD-10-CM

## 2012-04-18 LAB — CBC
MCV: 82 fL (ref 78.0–100.0)
Platelets: 112 10*3/uL — ABNORMAL LOW (ref 150–400)
RBC: 3.33 MIL/uL — ABNORMAL LOW (ref 4.22–5.81)
RDW: 14.5 % (ref 11.5–15.5)
WBC: 8.3 10*3/uL (ref 4.0–10.5)

## 2012-04-18 LAB — PROTIME-INR: Prothrombin Time: 17.3 seconds — ABNORMAL HIGH (ref 11.6–15.2)

## 2012-04-18 LAB — GLUCOSE, CAPILLARY
Glucose-Capillary: 98 mg/dL (ref 70–99)
Glucose-Capillary: 59 mg/dL — ABNORMAL LOW (ref 70–99)
Glucose-Capillary: 88 mg/dL (ref 70–99)

## 2012-04-18 LAB — RENAL FUNCTION PANEL
Calcium: 8.2 mg/dL — ABNORMAL LOW (ref 8.4–10.5)
GFR calc Af Amer: 3 mL/min — ABNORMAL LOW (ref >90)
GFR calc non Af Amer: 2 mL/min — ABNORMAL LOW (ref >90)
Glucose, Bld: 78 mg/dL (ref 70–99)
Phosphorus: 10.6 mg/dL — ABNORMAL HIGH (ref 2.3–4.6)
Potassium: 4 mEq/L (ref 3.5–5.1)
Sodium: 132 mEq/L — ABNORMAL LOW (ref 135–145)

## 2012-04-18 LAB — IRON AND TIBC: Iron: 42 ug/dL (ref 42–135)

## 2012-04-18 MED ORDER — SODIUM CHLORIDE 0.9 % IV SOLN: 100.0000 mL | INTRAVENOUS | Status: DC | PRN

## 2012-04-18 MED ORDER — HEPARIN SODIUM (PORCINE) 1000 UNIT/ML DIALYSIS
1000.0000 [IU] | INTRAMUSCULAR | Status: DC | PRN
  Filled 2012-04-18: qty 1

## 2012-04-18 MED ORDER — PENTAFLUOROPROP-TETRAFLUOROETH EX AERO: 1.0000 "application " | INHALATION_SPRAY | CUTANEOUS | Status: DC | PRN

## 2012-04-18 MED ORDER — DARBEPOETIN ALFA-POLYSORBATE 40 MCG/0.4ML IJ SOLN
40.0000 ug | INTRAMUSCULAR | Status: DC
  Administered 2012-04-25: 40 ug via INTRAVENOUS
  Filled 2012-04-18 (×2): qty 0.4

## 2012-04-18 MED ORDER — NEPRO/CARBSTEADY PO LIQD: 237.0000 mL | ORAL | Status: DC | PRN

## 2012-04-18 MED ORDER — LIDOCAINE HCL (PF) 1 % IJ SOLN: 5.0000 mL | INTRAMUSCULAR | Status: DC | PRN

## 2012-04-18 MED ORDER — LIDOCAINE-PRILOCAINE 2.5-2.5 % EX CREA: 1.0000 "application " | TOPICAL_CREAM | CUTANEOUS | Status: DC | PRN

## 2012-04-18 MED ORDER — ALTEPLASE 2 MG IJ SOLR
2.0000 mg | Freq: Once | INTRAMUSCULAR | Status: AC | PRN
  Filled 2012-04-18: qty 2

## 2012-04-18 NOTE — Progress Notes (Signed)
Subjective: Patient denies any complaints today, states he feels better-no headaches reported. Is very fidgety today  Objective: Vital signs in last 24 hours: Temp:  [98 F (36.7 Mcneil)-99.2 F (37.3 Mcneil)] 98 F (36.7 Mcneil) (05/30 0916) Pulse Rate:  [83-103] 89  (05/30 0916) Resp:  [17-18] 17  (05/30 0916) BP: (146-188)/(86-99) 146/87 mmHg (05/30 0916) SpO2:  [95 %-100 %] 100 % (05/30 0916) Weight:  [101.1 kg (222 lb 14.2 oz)] 101.1 kg (222 lb 14.2 oz) (05/29 2046) Weight change: 0 kg (0 lb) Last BM Date: 04/14/12  Intake/Output from previous day: 05/29 0701 - 05/30 0700 In: 1620 [P.O.:820; I.V.:400; IV Piggyback:400] Out: 500 [Urine:500] Total I/O In: 836.7 [I.V.:836.7] Out: 200 [Urine:200]   Physical Exam: General: alert,Comfortable, communicative, fully oriented, not short of breath at rest.  HEENT:  no conjunctival injection or discharge. Hydration appears fair. NECK:  Supple, JVP not seen, no carotid bruits, no palpable lymphadenopathy, no palpable goiter. CHEST:  Clinically clear to auscultation, no wheezes, no crackles. HEART:  Sounds 1 and 2 heard, normal, regular, no murmurs. ABDOMEN:  Full, soft, non-tender, no palpable organomegaly, no palpable masses, normal bowel sounds. LOWER EXTREMITIES: Slightly deep L>R LE edema- Has abrasions, over left knee and both upper anterior shins. There is a punch biopsy site, about 0.5 cm diameter, which has an eschar, without purulent exudate, and appears to be healing. There is still perifocal erythema, although this is mild  CENTRAL NERVOUS SYSTEM:  No focal neurologic deficit on gross examination.  Lab Results:  Gi Or Norman 04/16/12 0512  WBC 5.6  HGB 9.4*  HCT 26.0*  PLT 106*    Basename 04/18/12 0600 04/17/12 0841  NA 132* 131*  K 4.0 3.7  CL 85* 87*  CO2 19 18*  GLUCOSE 78 100*  BUN 97* 92*  CREATININE 18.31* 16.98*  CALCIUM 8.2* 8.2*   No results found for this or any previous visit (from the past 240 hour(s)).    Studies/Results: Doppler ultrasound-negative for DVT  Medications: Scheduled Meds:    . allopurinol  100 mg Oral Daily  . calcium carbonate  1 tablet Oral TID WC  . ciprofloxacin  400 mg Intravenous Q24H  . clindamycin (CLEOCIN) IV  300 mg Intravenous Q8H  . darbepoetin (ARANESP) injection - DIALYSIS  40 mcg Intravenous Q Thu-HD  . enoxaparin  30 mg Subcutaneous Q24H  . gabapentin  300 mg Oral QHS  . senna  1 tablet Oral QHS   Continuous Infusions:    .  sodium bicarbonate infusion 1000 mL 50 mL/hr at 04/17/12 1821   PRN Meds:.sodium chloride, sodium chloride, acetaminophen, acetaminophen, acetaminophen-codeine, alteplase, bisacodyl, feeding supplement (NEPRO CARB STEADY), heparin, hydrALAZINE, lidocaine, lidocaine-prilocaine, ondansetron (ZOFRAN) IV, ondansetron, pentafluoroprop-tetrafluoroeth  Assessment/Plan:  Active Problems:  ARF (acute renal failure): Patient presented with creatine of 10.79 and BUN 60, consistent with ARF. Although he is likely to have a degree of underlying CKD, given his comorbidities of long standing DM/HTN, this is difficult to prove at present, as baseline renal indices are not available. Precipitants, include ACE/diuretic and recent NSAID use, against a background ov vomiting and poor oral intake, pre-admission, likely predisposing ting to dehydration and volume depletion. Renal ultrasound shows no evidence of hydronephrosis, although there was prostatomegaly, and work-up with myeloma screen is in progress. Dr Camille Bal kindly provided renal consultation, and Dr Hyman Hopes is currently following. We are managing as recommended, with avoidance of nephrotoxins, iv fluid administration, with Bicarbonate. Unfortunately, renal function has failed to improve so far, and  on 5/29 continuing to trend up-creatinine 16.98, with BUN 92. Dialysis may prove necessary- Renal to determine the timing  -Creatinine continuing to worsen-18.3 today and per renal dialysis plan  after temporal dialysis catheter placed today 2. Hypoglycemic episode: Patient presented with altered mental status/confusion, and fell. Fortunately, he only sustained a few abrasions, but no bony injuries. CBG by EMS, was 33, and patient responded to iv D50W, and has since resolved, with iv infusion of D5W. This was due to continued utilization of oral hypoglycemic therapy, in the face of declining GFR. Glipizide remains on hold,  and serial CBGs  Are being monitored. BG remains stable today. 3. DM: Known type 2 diabetic. Pre-admssion control is uncertain. Otherwise, see discussion in #2 above. HBA1C is 7.0. - BG controlled today 4. HTN, Uncontrolled. ACE-i and diuretic are on hold,  Better BP control today, will continue prn Hydralazine not aiming for meticulous control at this time, so as not to worsen renal perfusion. But if on f/u in am BP still significantly elevated- will add norvasc 5. Early dementia: Stable.  6. Mild thrombocytopenia: May be chronic, and has remained stable.  7. Left foot cellulitis: Following skin biopsy, done on 04/09/12. Commenced on iv Ciprofloxacin/Clindamycin on 04/15/12, now day# 2 (Doxycycline was stopped on 04/15/12, after 2 days of therapy). MRI left foot on 04/15/12, showed nonspecific extensive subcutaneous edema, but no evidence of osteomyelitis or soft tissue abscess.  -LLE significantly more edematous than R. But per Doppler ultrasound done today 5/30 negative for DVT. - continue abx as above, no leukocytosis. 8 Constipation- continue senokot/dulcolax  Disposition: Per Renal.   LOS: 5 days   Gary Mcneil 04/18/2012, 11:39 AM

## 2012-04-18 NOTE — Progress Notes (Signed)
VASCULAR LAB PRELIMINARY  PRELIMINARY  PRELIMINARY  PRELIMINARY  Left lower extremity venous Doppler completed.    Preliminary report:  There is no DVT or SVT noted in the left lower extremity.  Sherren Kerns Dalzell, 04/18/2012, 11:22 AM

## 2012-04-18 NOTE — Procedures (Signed)
R IJ HD Catheter No complication No blood loss. See complete dictation in [facility]

## 2012-04-18 NOTE — Progress Notes (Signed)
[facility] ROUNDING NOTE   Subjective:    Interval History: none.  Objective:  Vital signs in last 24 hours:  Temp:  [98 F (36.7 C)-99.2 F (37.3 C)] 98 F (36.7 C) (05/30 0916) Pulse Rate:  [83-103] 89  (05/30 0916) Resp:  [17-18] 17  (05/30 0916) BP: (146-188)/(86-99) 146/87 mmHg (05/30 0916) SpO2:  [95 %-100 %] 100 % (05/30 0916) Weight:  [101.1 kg (222 lb 14.2 oz)] 101.1 kg (222 lb 14.2 oz) (05/29 2046)  Weight change: 0 kg (0 lb) Filed Weights   04/15/12 2043 04/16/12 2104 04/17/12 2046  Weight: 99.7 kg (219 lb 12.8 oz) 101.1 kg (222 lb 14.2 oz) 101.1 kg (222 lb 14.2 oz)    Intake/Output: I/O last 3 completed shifts: In: 2110 [P.O.:1060; I.V.:400; IV Piggyback:650] Out: 750 [Urine:750]   Intake/Output this shift:  Total I/O In: 836.7 [I.V.:836.7] Out: 200 [Urine:200]  CVS- RRR RS- CTA ABD- BS present soft non-distended EXT- no edema   Basic Metabolic Panel:  Lab 04/18/12 1610 04/17/12 0841 04/15/12 1100 04/15/12 0555 04/14/12 0345  NA 132* 131* 134* 136 132*  K 4.0 3.7 3.6 3.7 3.8  CL 85* 87* 94* 97 99  CO2 19 18* 17* 17* 15*  GLUCOSE 78 100* 128* 98 78  BUN 97* 92* 79* 80* 70*  CREATININE 18.31* 16.98* 14.14* 13.83* 11.93*  CALCIUM 8.2* 8.2* 7.7* -- --  MG -- -- -- -- --  PHOS 10.6* 9.7* 6.7* 7.1* --    Liver Function Tests:  Lab 04/18/12 0600 04/17/12 0841 04/15/12 1100 04/15/12 0555 04/14/12 0345 04/13/12 1447  AST -- -- -- -- 82* 87*  ALT -- -- -- -- 27 32  ALKPHOS -- -- -- -- 86 87  BILITOT -- -- -- -- 0.4 0.4  PROT -- -- -- -- 5.8* 6.6  ALBUMIN 3.0* 3.3* 3.2* 3.1* 3.1* --   No results found for this basename: LIPASE:5,AMYLASE:5 in the last 168 hours No results found for this basename: AMMONIA:3 in the last 168 hours  CBC:  Lab 04/16/12 0512 04/15/12 1100 04/15/12 0555 04/14/12 0345 04/13/12 1447  WBC 5.6 6.3 6.0 6.6 6.9  NEUTROABS -- -- -- -- --  HGB 9.4* 11.1* 11.0* 11.1* 12.5*  HCT 26.0* 30.2* 30.2* 30.4* 34.5*    MCV 82.8 83.4 83.9 84.9 85.2  PLT 106* 117* PLATELET CLUMPS NOTED ON SMEAR, COUNT APPEARS DECREASED 108* 118*    Cardiac Enzymes:  Lab 04/14/12 1200  CKTOTAL 4074*  CKMB --  CKMBINDEX --  TROPONINI --    BNP: No components found with this basename: POCBNP:5  CBG:  Lab 04/18/12 0746 04/17/12 2042 04/17/12 1626 04/17/12 1150 04/17/12 0750  GLUCAP 98 117* 117* 113* 87    Microbiology: No results found for this or any previous visit.  Coagulation Studies:  Basename 04/18/12 0600  LABPROT 17.3*  INR 1.39    Urinalysis: No results found for this basename: COLORURINE:2,APPERANCEUR:2,LABSPEC:2,PHURINE:2,GLUCOSEU:2,HGBUR:2,BILIRUBINUR:2,KETONESUR:2,PROTEINUR:2,UROBILINOGEN:2,NITRITE:2,LEUKOCYTESUR:2 in the last 72 hours    Imaging: No results found.   Medications:      .  sodium bicarbonate infusion 1000 mL 50 mL/hr at 04/17/12 1821      . allopurinol  100 mg Oral Daily  . calcium carbonate  1 tablet Oral TID WC  . ciprofloxacin  400 mg Intravenous Q24H  . clindamycin (CLEOCIN) IV  300 mg Intravenous Q8H  . enoxaparin  30 mg Subcutaneous Q24H  . gabapentin  300 mg Oral QHS  . senna  1 tablet Oral QHS  sodium chloride, sodium chloride, acetaminophen, acetaminophen, acetaminophen-codeine, alteplase, bisacodyl, feeding supplement (NEPRO CARB STEADY), heparin, hydrALAZINE, lidocaine, lidocaine-prilocaine, ondansetron (ZOFRAN) IV, ondansetron, pentafluoroprop-tetrafluoroeth  Assessment/ Plan:  71 y.o. 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 On presentation he was found to have renal failure with BUN/creatinine 60/10.79 with unknown recent baseline, in setting of ACE, Aleve, ETOH use (chronic), found down in the floor. He was transferred from  [facility] to [facility] in case he needs dialysis with worsening renal function.  1. AKI - Creatinine baseline 0.9  Now with worsening renal failure although not terribly uremic will plan dialysis today after placement of hemodialysis access.  2. Metabolic acidosis - stop IV bicarbonate 3. HTN/Vol status: controlled 4. Left ankle lesion with swelling, cellulitic change - primary has on clindamycin and ciprofloxacin.  X ray of foot does not show osteomyelitis  4. Gout - reduced allopurinol dose to 100 given renal function.  5. DM - per primary  6. Anemia: Hb dropped to 9.4 today baseline 11.  Check Iron studies  7. Neuropathy - on excessive gabapentin for renal function. Decrease to 300/day max.  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 PTH level Start him on binders- calcium carbonate  9 Dementia - unclear how severe (renal failure confounds assessment) but clearly has some insight       LOS: 5 Gary Mcneil W @TODAY @10 :40 AM

## 2012-04-18 NOTE — Progress Notes (Signed)
ANTIBIOTIC CONSULT NOTE - FOLLOW-UP  Pharmacy Consult for Ciprofloxacin Indication: L diabetic foot infection / cellulitis  Allergies  Allergen Reactions  . Penicillins Rash    Patient Measurements: Height: 6\' 2"  (188 cm) Weight: 222 lb 14.2 oz (101.1 kg) IBW/kg (Calculated) : 82.2   Vital Signs: Temp: 98 F (36.7 C) (05/30 0916) Temp src: Oral (05/30 0916) BP: 146/87 mmHg (05/30 0916) Pulse Rate: 89  (05/30 0916) Intake/Output from previous day: 05/29 0701 - 05/30 0700 In: 1620 [P.O.:820; I.V.:400; IV Piggyback:400] Out: 500 [Urine:500] Intake/Output from this shift: Total I/O In: 836.7 [I.V.:836.7] Out: 200 [Urine:200]  Labs:  Basename 04/18/12 0600 04/17/12 0841 04/16/12 0512 04/15/12 1300  WBC -- -- 5.6 --  HGB -- -- 9.4* --  PLT -- -- 106* --  LABCREA -- -- -- 35.80  CREATININE 18.31* 16.98* -- --   Estimated Creatinine Clearance: 4.8 ml/min (by C-G formula based on Cr of 18.31). No results found for this basename: VANCOTROUGH:2,VANCOPEAK:2,VANCORANDOM:2,GENTTROUGH:2,GENTPEAK:2,GENTRANDOM:2,TOBRATROUGH:2,TOBRAPEAK:2,TOBRARND:2,AMIKACINPEAK:2,AMIKACINTROU:2,AMIKACIN:2, in the last 72 hours   Microbiology: No results found for this or any previous visit (from the past 720 hour(s)).  Medical History: Past Medical History  Diagnosis Date  . Diabetes mellitus   . DEMENTIA   . Neuromuscular disorder   . Hypertension   . Gout   . Arthritis     Assessment: 71 y.o. M on day 4 of ciprofloxacin per RX and clindamycin for L foot cellulitis / diabetic foot infection.  No osteomyelitis seen on XRay of foot.  Pt has ARF with rising SCr up to 18.3 today (baseline 0.9).  UOP 500 ml (0.2 ml/kg/hr) 5/29, I/O: +1.12L.  Plan is for dialysis session today after cath placement.  Afebrile, WBC WNL.  No cultures.    Goal of Therapy:  Eradication of Infection  Plan:  1. Continue Cipro 400 mg IV every 24 hours 2. Follow renal function, cultures, LOT, and antibiotic  de-escalation plans   Haynes Hoehn, PharmD 04/18/2012 11:28 AM  Pager: [phone]

## 2012-04-19 DIAGNOSIS — N179 Acute kidney failure, unspecified: Secondary | ICD-10-CM

## 2012-04-19 DIAGNOSIS — E1165 Type 2 diabetes mellitus with hyperglycemia: Secondary | ICD-10-CM

## 2012-04-19 DIAGNOSIS — I10 Essential (primary) hypertension: Secondary | ICD-10-CM

## 2012-04-19 DIAGNOSIS — F05 Delirium due to known physiological condition: Secondary | ICD-10-CM

## 2012-04-19 DIAGNOSIS — F0390 Unspecified dementia without behavioral disturbance: Secondary | ICD-10-CM

## 2012-04-19 LAB — GLUCOSE, CAPILLARY
Glucose-Capillary: 97 mg/dL (ref 70–99)
Glucose-Capillary: 102 mg/dL — ABNORMAL HIGH (ref 70–99)

## 2012-04-19 LAB — HEPATITIS B SURFACE ANTIGEN: Hepatitis B Surface Ag: NEGATIVE

## 2012-04-19 LAB — RENAL FUNCTION PANEL
BUN: 87 mg/dL — ABNORMAL HIGH (ref 6–23)
CO2: 20 mEq/L (ref 19–32)
Chloride: 88 mEq/L — ABNORMAL LOW (ref 96–112)
GFR calc Af Amer: 3 mL/min — ABNORMAL LOW (ref >90)
Glucose, Bld: 93 mg/dL (ref 70–99)
Phosphorus: 9.1 mg/dL — ABNORMAL HIGH (ref 2.3–4.6)
Potassium: 3.9 mEq/L (ref 3.5–5.1)
Sodium: 131 mEq/L — ABNORMAL LOW (ref 135–145)

## 2012-04-19 LAB — HEPATITIS B CORE ANTIBODY, TOTAL: Hep B Core Total Ab: NEGATIVE

## 2012-04-19 MED FILL — Heparin Sodium (Porcine) Inj 1000 Unit/ML: INTRAMUSCULAR | Qty: 20 | Status: AC

## 2012-04-19 NOTE — Progress Notes (Signed)
Subjective: Denies any complaints, no nausea or vomiting no chest pain.  Objective: Vital signs in last 24 hours: Temp:  [97.6 F (36.4 C)-100 F (37.8 C)] 98.6 F (37 C) (05/31 0940) Pulse Rate:  [69-111] 93  (05/31 0940) Resp:  [16-18] 16  (05/31 0940) BP: (134-192)/(76-119) 134/76 mmHg (05/31 0940) SpO2:  [95 %-99 %] 99 % (05/31 0940) FiO2 (%):  [2 %] 2 % (05/30 2210) Weight:  [97 kg (213 lb 13.5 oz)] 97 kg (213 lb 13.5 oz) (05/30 2015) Weight change: -4.1 kg (-9 lb 0.6 oz) Last BM Date: 04/14/12  Intake/Output from previous day: 05/30 0701 - 05/31 0700 In: 836.7 [I.V.:836.7] Out: 1408 [Urine:400] Total I/O In: 120 [P.O.:120] Out: 525 [Urine:525]   Physical Exam: General: alert,Comfortable, communicative, fully oriented, not short of breath at rest.  HEENT:  no conjunctival injection or discharge. Hydration appears fair. NECK:  Supple, JVP not seen, no carotid bruits, no palpable lymphadenopathy, no palpable goiter. CHEST:  Clinically clear to auscultation, no wheezes, no crackles. HEART:  Sounds 1 and 2 heard, normal, regular, no murmurs. ABDOMEN:  Full, soft, non-tender, no palpable organomegaly, no palpable masses, normal bowel sounds. LOWER EXTREMITIES: Slightly deep L>R LE edema- Has abrasions, over left knee and both upper anterior shins. There is a punch biopsy site, about 0.5 cm diameter, which has an eschar, without purulent exudate, and appears to be healing. There is still perifocal erythema, although this is mild  CENTRAL NERVOUS SYSTEM:  No focal neurologic deficit on gross examination.  Lab Results:  [facility] 04/18/12 2032  WBC 8.3  HGB 10.3*  HCT 27.3*  PLT 112*    Basename 04/19/12 0535 04/18/12 0600  NA 131* 132*  K 3.9 4.0  CL 88* 85*  CO2 20 19  GLUCOSE 93 78  BUN 87* 97*  CREATININE 15.04* 18.31*  CALCIUM 8.5 8.2*   No results found for this or any previous visit (from the past 240 hour(s)).   Studies/Results: Doppler  ultrasound-negative for DVT  Medications: Scheduled Meds:    . allopurinol  100 mg Oral Daily  . calcium carbonate  1 tablet Oral TID WC  . ciprofloxacin  400 mg Intravenous Q24H  . clindamycin (CLEOCIN) IV  300 mg Intravenous Q8H  . darbepoetin (ARANESP) injection - DIALYSIS  40 mcg Intravenous Q Thu-HD  . enoxaparin  30 mg Subcutaneous Q24H  . gabapentin  300 mg Oral QHS  . senna  1 tablet Oral QHS   Continuous Infusions:    . DISCONTD:  sodium bicarbonate infusion 1000 mL 50 mL/hr at 04/18/12 1727   PRN Meds:.sodium chloride, sodium chloride, acetaminophen, acetaminophen, acetaminophen-codeine, alteplase, bisacodyl, feeding supplement (NEPRO CARB STEADY), heparin, hydrALAZINE, lidocaine, lidocaine-prilocaine, ondansetron (ZOFRAN) IV, ondansetron, pentafluoroprop-tetrafluoroeth  Assessment/Plan:  Active Problems:  ARF (acute renal failure): Patient presented with creatine of 10.79 and BUN 60, consistent with ARF. Although he is likely to have a degree of underlying CKD, given his comorbidities of long standing DM/HTN, this is difficult to prove at present, as baseline renal indices are not available. Precipitants, include ACE/diuretic and recent NSAID use, against a background ov vomiting and poor oral intake, pre-admission, likely predisposing ting to dehydration and volume depletion. Renal ultrasound shows no evidence of hydronephrosis, although there was prostatomegaly, and work-up with myeloma screen is in progress. Dr Camille Bal kindly provided renal consultation, and Dr Hyman Hopes is currently following. We are managing as recommended, with avoidance of nephrotoxins, iv fluid administration, with Bicarbonate. Unfortunately, renal function has failed to  improve so far, and on 5/29 continuing to trend up-creatinine 16.98, with BUN 92. Dialysis may prove necessary- Renal to determine the timing  -Dialysis initiated 5/30, appreciate renal assistance-Dr. Hyman Hopes to follow and reassess it in  a.m. for further recommendations. 2. Hypoglycemic episode: Patient presented with altered mental status/confusion, and fell. Fortunately, he only sustained a few abrasions, but no bony injuries. CBG by EMS, was 33, and patient responded to iv D50W, and has since resolved, with iv infusion of D5W. This was due to continued utilization of oral hypoglycemic therapy, in the face of declining GFR. Glipizide remains on hold,  and serial CBGs  Are being monitored. BG remains stable today. 3. DM: Known type 2 diabetic. Pre-admssion control is uncertain. Otherwise, see discussion in #2 above. HBA1C is 7.0. - BG controlled today 4. HTN, Uncontrolled. ACE-i and diuretic are on hold,  ok BP control today, will continue prn Hydralazine not aiming for meticulous control at this time, so as not to worsen renal perfusion.  5. Early dementia: Stable.  6. Mild thrombocytopenia: May be chronic, and has remained stable.  7. Left foot cellulitis: Following skin biopsy, done on 04/09/12. Commenced on iv Ciprofloxacin/Clindamycin on 04/15/12, now day# 2 (Doxycycline was stopped on 04/15/12, after 2 days of therapy). MRI left foot on 04/15/12, showed nonspecific extensive subcutaneous edema, but no evidence of osteomyelitis or soft tissue abscess.  -LLE significantly more edematous than R. But per Doppler ultrasound done today 5/30 negative for DVT. - continue abx as above, no leukocytosis. 8 Constipation- continue senokot/dulcolax prn  Disposition: Per Renal.   LOS: 6 days   Carrell Palmatier C 04/19/2012, 3:05 PM

## 2012-04-19 NOTE — Progress Notes (Signed)
[facility] ROUNDING NOTE   Subjective:   Interval History: none.wants to go home  Objective:  Vital signs in last 24 hours:  Temp:  [97.6 F (36.4 C)-100 F (37.8 C)] 98.6 F (37 C) (05/31 0940) Pulse Rate:  [69-111] 93  (05/31 0940) Resp:  [16-18] 16  (05/31 0940) BP: (134-192)/(76-119) 134/76 mmHg (05/31 0940) SpO2:  [95 %-99 %] 99 % (05/31 0940) FiO2 (%):  [2 %] 2 % (05/30 2210) Weight:  [97 kg (213 lb 13.5 oz)] 97 kg (213 lb 13.5 oz) (05/30 2015)  Weight change: -4.1 kg (-9 lb 0.6 oz) Filed Weights   04/16/12 2104 04/17/12 2046 04/18/12 2015  Weight: 101.1 kg (222 lb 14.2 oz) 101.1 kg (222 lb 14.2 oz) 97 kg (213 lb 13.5 oz)    Intake/Output: I/O last 3 completed shifts: In: 1476.7 [P.O.:340; I.V.:836.7; IV Piggyback:300] Out: 1408 [Urine:400; Other:1008]   Intake/Output this shift:  Total I/O In: 120 [P.O.:120] Out: 525 [Urine:525]  CVS- RRR RS- CTA ABD- BS present soft non-distended EXT- no edema   Basic Metabolic Panel:  Lab 04/19/12 1610 04/18/12 0600 04/17/12 0841 04/15/12 1100 04/15/12 0555  NA 131* 132* 131* 134* 136  K 3.9 4.0 3.7 3.6 3.7  CL 88* 85* 87* 94* 97  CO2 20 19 18* 17* 17*  GLUCOSE 93 78 100* 128* 98  BUN 87* 97* 92* 79* 80*  CREATININE 15.04* 18.31* 16.98* 14.14* 13.83*  CALCIUM 8.5 8.2* 8.2* -- --  MG -- -- -- -- --  PHOS 9.1* 10.6* 9.7* 6.7* 7.1*    Liver Function Tests:  Lab 04/19/12 0535 04/18/12 0600 04/17/12 0841 04/15/12 1100 04/15/12 0555 04/14/12 0345 04/13/12 1447  AST -- -- -- -- -- 82* 87*  ALT -- -- -- -- -- 27 32  ALKPHOS -- -- -- -- -- 86 87  BILITOT -- -- -- -- -- 0.4 0.4  PROT -- -- -- -- -- 5.8* 6.6  ALBUMIN 3.0* 3.0* 3.3* 3.2* 3.1* -- --   No results found for this basename: LIPASE:5,AMYLASE:5 in the last 168 hours No results found for this basename: AMMONIA:3 in the last 168 hours  CBC:  Lab 04/18/12 2032 04/16/12 0512 04/15/12 1100 04/15/12 0555 04/14/12 0345  WBC 8.3 5.6 6.3 6.0 6.6    NEUTROABS -- -- -- -- --  HGB 10.3* 9.4* 11.1* 11.0* 11.1*  HCT 27.3* 26.0* 30.2* 30.2* 30.4*  MCV 82.0 82.8 83.4 83.9 84.9  PLT 112* 106* 117* PLATELET CLUMPS NOTED ON SMEAR, COUNT APPEARS DECREASED 108*    Cardiac Enzymes:  Lab 04/14/12 1200  CKTOTAL 4074*  CKMB --  CKMBINDEX --  TROPONINI --    BNP: No components found with this basename: POCBNP:5  CBG:  Lab 04/19/12 1152 04/19/12 0808 04/18/12 2258 04/18/12 1659 04/18/12 1142  GLUCAP 102* 97 88 59* 87    Microbiology: No results found for this or any previous visit.  Coagulation Studies:  Basename 04/18/12 0600  LABPROT 17.3*  INR 1.39    Urinalysis: No results found for this basename: COLORURINE:2,APPERANCEUR:2,LABSPEC:2,PHURINE:2,GLUCOSEU:2,HGBUR:2,BILIRUBINUR:2,KETONESUR:2,PROTEINUR:2,UROBILINOGEN:2,NITRITE:2,LEUKOCYTESUR:2 in the last 72 hours    Imaging: Ir Fluoro Guide Cv Line Right  04/18/2012  *RADIOLOGY REPORT*  Clinical history: Acute renal failure, needs access for hemodialysis  RIGHT IJ CATHETER PLACEMENT UNDER ULTRASOUND AND FLUOROSCOPIC GUIDANCE  Technique: The procedure, risks (including but not limited to bleeding, infection, organ damage, pneumothorax), benefits, and alternatives were explained to the patient.  Questions regarding the procedure were encouraged and answered.  The patient  understands and consents to the procedure. Patency of the right IJ vein was confirmed with ultrasound with image documentation. An appropriate skin site was determined. Skin site was marked. Region was prepped using maximum barrier technique including cap and mask, sterile gown, sterile gloves, large sterile sheet, and Chlorhexidine as cutaneous antisepsis.  The region was infiltrated locally with 1% lidocaine.  Under real-time ultrasound guidance, the right  IJ vein was accessed with a 19 gauge needle; the needle tip within the vein was confirmed with ultrasound image documentation.   The needle exchanged over a  guidewire for vascular dilator which allowed advancement of a 24 cm Trialysis catheter. This was positioned with the tip in the proximal right atrium. Spot chest radiograph shows good positioning and no pneumothorax. Catheter was flushed and sutured externally with 0-Prolene sutures. Patient tolerated the procedure well, with no immediate complication.  IMPRESSION 1. Technically successful right IJ Trialysis catheter placement.  Original Report Authenticated By: Osa Craver, M.D.   Ir US Guide Vasc Access Right  04/18/2012  *RADIOLOGY REPORT*  Clinical history: Acute renal failure, needs access for hemodialysis  RIGHT IJ CATHETER PLACEMENT UNDER ULTRASOUND AND FLUOROSCOPIC GUIDANCE  Technique: The procedure, risks (including but not limited to bleeding, infection, organ damage, pneumothorax), benefits, and alternatives were explained to the patient.  Questions regarding the procedure were encouraged and answered.  The patient understands and consents to the procedure. Patency of the right IJ vein was confirmed with ultrasound with image documentation. An appropriate skin site was determined. Skin site was marked. Region was prepped using maximum barrier technique including cap and mask, sterile gown, sterile gloves, large sterile sheet, and Chlorhexidine as cutaneous antisepsis.  The region was infiltrated locally with 1% lidocaine.  Under real-time ultrasound guidance, the right  IJ vein was accessed with a 19 gauge needle; the needle tip within the vein was confirmed with ultrasound image documentation.   The needle exchanged over a guidewire for vascular dilator which allowed advancement of a 24 cm Trialysis catheter. This was positioned with the tip in the proximal right atrium. Spot chest radiograph shows good positioning and no pneumothorax. Catheter was flushed and sutured externally with 0-Prolene sutures. Patient tolerated the procedure well, with no immediate complication.  IMPRESSION 1.  Technically successful right IJ Trialysis catheter placement.  Original Report Authenticated By: Osa Craver, M.D.     Medications:      . DISCONTD:  sodium bicarbonate infusion 1000 mL 50 mL/hr at 04/18/12 1727      . allopurinol  100 mg Oral Daily  . calcium carbonate  1 tablet Oral TID WC  . ciprofloxacin  400 mg Intravenous Q24H  . clindamycin (CLEOCIN) IV  300 mg Intravenous Q8H  . darbepoetin (ARANESP) injection - DIALYSIS  40 mcg Intravenous Q Thu-HD  . enoxaparin  30 mg Subcutaneous Q24H  . gabapentin  300 mg Oral QHS  . senna  1 tablet Oral QHS   sodium chloride, sodium chloride, acetaminophen, acetaminophen, acetaminophen-codeine, alteplase, bisacodyl, feeding supplement (NEPRO CARB STEADY), heparin, hydrALAZINE, lidocaine, lidocaine-prilocaine, ondansetron (ZOFRAN) IV, ondansetron, pentafluoroprop-tetrafluoroeth  Assessment/ Plan:  71 y.o. 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 On presentation he was found to have renal failure with BUN/creatinine 60/10.79 with unknown recent baseline, in setting of  ACE, Aleve, ETOH use (chronic), found down in the floor. He was [facility] from [facility] to [facility] in case he needs dialysis with worsening renal function.  1. AKI - Creatinine baseline 0.9 Now with worsening renal failure   2. Metabolic acidosis - stop IV bicarbonate  3. HTN/Vol status: controlled  4. Left ankle lesion with swelling, cellulitic change - primary has on clindamycin and ciprofloxacin.  X ray of foot does not show osteomyelitis  4. Gout - reduced allopurinol dose to 100 given renal function.  5. DM - per primary  6. Anemia: Hb dropped to 9.4 today baseline 11.  Check Iron studies  7. Neuropathy - on excessive gabapentin for  renal function. Decrease to 300/day max.  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 PTH level  Start him on binders- calcium carbonate  9 Dementia - unclear how severe (renal failure confounds assessment) but clearly has some insight   Dialysis yesterday. I will see how creatinine appears tomorrow   LOS: 6 Gary Mcneil W @TODAY @3 :09 PM

## 2012-04-20 DIAGNOSIS — N179 Acute kidney failure, unspecified: Secondary | ICD-10-CM

## 2012-04-20 DIAGNOSIS — F05 Delirium due to known physiological condition: Secondary | ICD-10-CM

## 2012-04-20 DIAGNOSIS — F0390 Unspecified dementia without behavioral disturbance: Secondary | ICD-10-CM

## 2012-04-20 DIAGNOSIS — I10 Essential (primary) hypertension: Secondary | ICD-10-CM

## 2012-04-20 DIAGNOSIS — E1165 Type 2 diabetes mellitus with hyperglycemia: Secondary | ICD-10-CM

## 2012-04-20 LAB — RENAL FUNCTION PANEL
Albumin: 2.7 g/dL — ABNORMAL LOW (ref 3.5–5.2)
Calcium: 8.3 mg/dL — ABNORMAL LOW (ref 8.4–10.5)
Creatinine, Ser: 16.14 mg/dL — ABNORMAL HIGH (ref 0.50–1.35)
GFR calc Af Amer: 3 mL/min — ABNORMAL LOW (ref >90)
GFR calc non Af Amer: 3 mL/min — ABNORMAL LOW (ref >90)
Sodium: 132 mEq/L — ABNORMAL LOW (ref 135–145)

## 2012-04-20 LAB — GLUCOSE, CAPILLARY
Glucose-Capillary: 117 mg/dL — ABNORMAL HIGH (ref 70–99)
Glucose-Capillary: 102 mg/dL — ABNORMAL HIGH (ref 70–99)
Glucose-Capillary: 116 mg/dL — ABNORMAL HIGH (ref 70–99)

## 2012-04-20 NOTE — Progress Notes (Signed)
Gary Mcneil is a pleasant 71 y.o. male who passed out at home and was found to be profoundly hypoglycemic, and in acute renal failure. I have reviewed his chart, seen and examined him at bed side. Appreciate renal.  SUBJECTIVE Feels ok. No complaints. He is more with it now and can recount what happened on the day he passed out.   1. Altered mental status   2. 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 Renal failure   4. Hypertension     Past Medical History  Diagnosis Date  . Diabetes mellitus   . DEMENTIA   . Neuromuscular disorder   . Hypertension   . Gout   . Arthritis    Current Facility-Administered Medications  Medication Dose Route Frequency Provider Last Rate Last Dose  . 0.9 %  sodium chloride infusion  100 mL Intravenous PRN [name], MD      . 0.9 %  sodium chloride infusion  100 mL Intravenous PRN [name], MD      . acetaminophen (TYLENOL) tablet 650 mg  650 mg Oral Q6H PRN Zannie Cove, MD   650 mg at 04/20/12 1141   Or  . acetaminophen (TYLENOL) suppository 650 mg  650 mg Rectal Q6H PRN Zannie Cove, MD      . acetaminophen-codeine (TYLENOL #3) 300-30 MG per tablet 1 tablet  1 tablet Oral Q8H PRN Zannie Cove, MD   1 tablet at 04/19/12 1732  . allopurinol (ZYLOPRIM) tablet 100 mg  100 mg Oral Daily Sadie Haber, MD   100 mg at 04/20/12 0918  . bisacodyl (DULCOLAX) EC tablet 10 mg  10 mg Oral Daily PRN Kela Millin, MD      . calcium carbonate (TUMS - dosed in mg elemental calcium) chewable tablet 200 mg of elemental calcium  1 tablet Oral TID WC [name], MD   200 mg of elemental calcium at 04/20/12 1141  . ciprofloxacin (CIPRO) IVPB 400 mg  400 mg Intravenous Q24H Ann Held, PHARMD   400 mg at 04/19/12 2001  . clindamycin (CLEOCIN) IVPB 300 mg  300 mg Intravenous Q8H Laveda Norman, MD   300 mg at 04/20/12 0600  . darbepoetin (ARANESP) injection 40 mcg  40 mcg Intravenous Q Thu-HD [name], MD      . enoxaparin (LOVENOX)  injection 30 mg  30 mg Subcutaneous Q24H Zannie Cove, MD   30 mg at 04/19/12 2001  . feeding supplement (NEPRO CARB STEADY) liquid 237 mL  237 mL Oral PRN [name], MD      . gabapentin (NEURONTIN) capsule 300 mg  300 mg Oral QHS Sadie Haber, MD   300 mg at 04/19/12 2059  . heparin injection 1,000 Units  1,000 Units Dialysis PRN [name], MD      . hydrALAZINE (APRESOLINE) injection 10 mg  10 mg Intravenous Q4H PRN Kela Millin, MD   10 mg at 04/18/12 2327  . lidocaine (XYLOCAINE) 1 % injection 5 mL  5 mL Intradermal PRN [name], MD      . lidocaine-prilocaine (EMLA) cream 1 application  1 application Topical PRN [name], MD      . ondansetron [facility]) tablet 4 mg  4 mg Oral Q6H PRN Zannie Cove, MD       Or  . ondansetron (ZOFRAN) injection 4 mg  4 mg Intravenous Q6H PRN Zannie Cove, MD      . pentafluoroprop-tetrafluoroeth (  GEBAUERS) aerosol 1 application  1 application Topical PRN [name], MD      . senna [facility]) tablet 8.6 mg  1 tablet Oral QHS Kela Millin, MD   8.6 mg at 04/19/12 2101   Allergies  Allergen Reactions  . Penicillins Rash   Active Problems:  ARF (acute renal failure)  HTN (hypertension)  Hypoglycemia  DM (diabetes mellitus)   Vital signs in last 24 hours: Temp:  [97.1 F (36.2 C)-98.7 F (37.1 C)] 98.1 F (36.7 C) (06/01 1003) Pulse Rate:  [91-102] 95  (06/01 1003) Resp:  [17-18] 17  (06/01 1003) BP: (160-183)/(87-115) 165/115 mmHg (06/01 1003) SpO2:  [93 %-99 %] 96 % (06/01 1003) Weight:  [101.3 kg (223 lb 5.2 oz)] 101.3 kg (223 lb 5.2 oz) (05/31 2020) Weight change: 4.3 kg (9 lb 7.7 oz) Last BM Date: 04/14/12  Intake/Output from previous day: 05/31 0701 - 06/01 0700 In: 170 [P.O.:120; IV Piggyback:50] Out: 725 [Urine:725] Intake/Output this shift: Total I/O In: 240 [P.O.:240] Out: 450 [Urine:450]  Lab Results:  [facility] 04/18/12 2032  WBC 8.3  HGB 10.3*  HCT 27.3*  PLT 112*    BMET  Basename 04/20/12 0535 04/19/12 0535  NA 132* 131*  K 3.8 3.9  CL 88* 88*  CO2 20 20  GLUCOSE 83 93  BUN 98* 87*  CREATININE 16.14* 15.04*  CALCIUM 8.3* 8.5    Studies/Results: Ir Fluoro Guide Cv Line Right  04/18/2012  *RADIOLOGY REPORT*  Clinical history: Acute renal failure, needs access for hemodialysis  RIGHT IJ CATHETER PLACEMENT UNDER ULTRASOUND AND FLUOROSCOPIC GUIDANCE  Technique: The procedure, risks (including but not limited to bleeding, infection, organ damage, pneumothorax), benefits, and alternatives were explained to the patient.  Questions regarding the procedure were encouraged and answered.  The patient understands and consents to the procedure. Patency of the right IJ vein was confirmed with ultrasound with image documentation. An appropriate skin site was determined. Skin site was marked. Region was prepped using maximum barrier technique including cap and mask, sterile gown, sterile gloves, large sterile sheet, and Chlorhexidine as cutaneous antisepsis.  The region was infiltrated locally with 1% lidocaine.  Under real-time ultrasound guidance, the right  IJ vein was accessed with a 19 gauge needle; the needle tip within the vein was confirmed with ultrasound image documentation.   The needle exchanged over a guidewire for vascular dilator which allowed advancement of a 24 cm Trialysis catheter. This was positioned with the tip in the proximal right atrium. Spot chest radiograph shows good positioning and no pneumothorax. Catheter was flushed and sutured externally with 0-Prolene sutures. Patient tolerated the procedure well, with no immediate complication.  IMPRESSION 1. Technically successful right IJ Trialysis catheter placement.  Original Report Authenticated By: Osa Craver, M.D.   Ir US Guide Vasc Access Right  04/18/2012  *RADIOLOGY REPORT*  Clinical history: Acute renal failure, needs access for hemodialysis  RIGHT IJ CATHETER PLACEMENT UNDER  ULTRASOUND AND FLUOROSCOPIC GUIDANCE  Technique: The procedure, risks (including but not limited to bleeding, infection, organ damage, pneumothorax), benefits, and alternatives were explained to the patient.  Questions regarding the procedure were encouraged and answered.  The patient understands and consents to the procedure. Patency of the right IJ vein was confirmed with ultrasound with image documentation. An appropriate skin site was determined. Skin site was marked. Region was prepped using maximum barrier technique including cap and mask, sterile gown, sterile gloves, large sterile sheet, and Chlorhexidine as cutaneous antisepsis.  The  region was infiltrated locally with 1% lidocaine.  Under real-time ultrasound guidance, the right  IJ vein was accessed with a 19 gauge needle; the needle tip within the vein was confirmed with ultrasound image documentation.   The needle exchanged over a guidewire for vascular dilator which allowed advancement of a 24 cm Trialysis catheter. This was positioned with the tip in the proximal right atrium. Spot chest radiograph shows good positioning and no pneumothorax. Catheter was flushed and sutured externally with 0-Prolene sutures. Patient tolerated the procedure well, with no immediate complication.  IMPRESSION 1. Technically successful right IJ Trialysis catheter placement.  Original Report Authenticated By: Osa Craver, M.D.    Medications: I have reviewed the patient's current medications.   Physical exam GENERAL- alert HEAD- hd cath right neck. RESPIRATORY- appears well, vitals normal, no respiratory distress, acyanotic, normal RR, ear and throat exam is normal, neck free of mass or lymphadenopathy, chest clear, no wheezing, crepitations, rhonchi, normal symmetric air entry CVS- regular rate and rhythm, S1, S2 normal, no murmur, click, rub or gallop ABDOMEN- abdomen is soft without significant tenderness, masses, organomegaly or guarding NEURO-  Grossly normal EXTREMITIES- extremities normal, atraumatic, no cyanosis or edema  Plan  *  ARF (acute renal failure)- Appreciate renal. Little change in creatinine. Defer Mx to Dr Hyman Hopes. *  HTN (hypertension)- BP fluctuating, monitor. *  Hypoglycemia/ DM (diabetes mellitus)- hypoglycemia related to renal insufficiency. HbA1c 7. Generally controlled. Continue current mx. * dvt prophylaxis.    Jora Galluzzo 04/20/2012 12:10 PM Pager: [phone].

## 2012-04-20 NOTE — Progress Notes (Signed)
[facility] ROUNDING NOTE   Subjective:   Interval History: none.  Objective:  Vital signs in last 24 hours:  Temp:  [97.1 F (36.2 C)-98.7 F (37.1 C)] 98.1 F (36.7 C) (06/01 1003) Pulse Rate:  [91-102] 95  (06/01 1003) Resp:  [17-18] 17  (06/01 1003) BP: (160-183)/(87-115) 165/115 mmHg (06/01 1003) SpO2:  [93 %-99 %] 96 % (06/01 1003) Weight:  [101.3 kg (223 lb 5.2 oz)] 101.3 kg (223 lb 5.2 oz) (05/31 2020)  Weight change: 4.3 kg (9 lb 7.7 oz) Filed Weights   04/17/12 2046 04/18/12 2015 04/19/12 2020  Weight: 101.1 kg (222 lb 14.2 oz) 97 kg (213 lb 13.5 oz) 101.3 kg (223 lb 5.2 oz)    Intake/Output: I/O last 3 completed shifts: In: 170 [P.O.:120; IV Piggyback:50] Out: 1833 [Urine:825; Other:1008]   Intake/Output this shift:  Total I/O In: 240 [P.O.:240] Out: 450 [Urine:450]  CVS- RRR RS- CTA ABD- BS present soft non-distended EXT- no edema   Basic Metabolic Panel:  Lab 04/20/12 1610 04/19/12 0535 04/18/12 0600 04/17/12 0841 04/15/12 1100  NA 132* 131* 132* 131* 134*  K 3.8 3.9 4.0 3.7 3.6  CL 88* 88* 85* 87* 94*  CO2 20 20 19  18* 17*  GLUCOSE 83 93 78 100* 128*  BUN 98* 87* 97* 92* 79*  CREATININE 16.14* 15.04* 18.31* 16.98* 14.14*  CALCIUM 8.3* 8.5 8.2* -- --  MG -- -- -- -- --  PHOS 10.0* 9.1* 10.6* 9.7* 6.7*    Liver Function Tests:  Lab 04/20/12 0535 04/19/12 0535 04/18/12 0600 04/17/12 0841 04/15/12 1100 04/14/12 0345 04/13/12 1447  AST -- -- -- -- -- 82* 87*  ALT -- -- -- -- -- 27 32  ALKPHOS -- -- -- -- -- 86 87  BILITOT -- -- -- -- -- 0.4 0.4  PROT -- -- -- -- -- 5.8* 6.6  ALBUMIN 2.7* 3.0* 3.0* 3.3* 3.2* -- --   No results found for this basename: LIPASE:5,AMYLASE:5 in the last 168 hours No results found for this basename: AMMONIA:3 in the last 168 hours  CBC:  Lab 04/18/12 2032 04/16/12 0512 04/15/12 1100 04/15/12 0555 04/14/12 0345  WBC 8.3 5.6 6.3 6.0 6.6  NEUTROABS -- -- -- -- --  HGB 10.3* 9.4* 11.1* 11.0* 11.1*   HCT 27.3* 26.0* 30.2* 30.2* 30.4*  MCV 82.0 82.8 83.4 83.9 84.9  PLT 112* 106* 117* PLATELET CLUMPS NOTED ON SMEAR, COUNT APPEARS DECREASED 108*    Cardiac Enzymes:  Lab 04/14/12 1200  CKTOTAL 4074*  CKMB --  CKMBINDEX --  TROPONINI --    BNP: No components found with this basename: POCBNP:5  CBG:  Lab 04/20/12 0755 04/19/12 2035 04/19/12 1625 04/19/12 1152 04/19/12 0808  GLUCAP 90 111* 111* 102* 97    Microbiology: No results found for this or any previous visit.  Coagulation Studies:  Basename 04/18/12 0600  LABPROT 17.3*  INR 1.39    Urinalysis: No results found for this basename: COLORURINE:2,APPERANCEUR:2,LABSPEC:2,PHURINE:2,GLUCOSEU:2,HGBUR:2,BILIRUBINUR:2,KETONESUR:2,PROTEINUR:2,UROBILINOGEN:2,NITRITE:2,LEUKOCYTESUR:2 in the last 72 hours    Imaging: Ir Fluoro Guide Cv Line Right  04/18/2012  *RADIOLOGY REPORT*  Clinical history: Acute renal failure, needs access for hemodialysis  RIGHT IJ CATHETER PLACEMENT UNDER ULTRASOUND AND FLUOROSCOPIC GUIDANCE  Technique: The procedure, risks (including but not limited to bleeding, infection, organ damage, pneumothorax), benefits, and alternatives were explained to the patient.  Questions regarding the procedure were encouraged and answered.  The patient understands and consents to the procedure. Patency of the right IJ vein was confirmed  with ultrasound with image documentation. An appropriate skin site was determined. Skin site was marked. Region was prepped using maximum barrier technique including cap and mask, sterile gown, sterile gloves, large sterile sheet, and Chlorhexidine as cutaneous antisepsis.  The region was infiltrated locally with 1% lidocaine.  Under real-time ultrasound guidance, the right  IJ vein was accessed with a 19 gauge needle; the needle tip within the vein was confirmed with ultrasound image documentation.   The needle exchanged over a guidewire for vascular dilator which allowed advancement of a 24  cm Trialysis catheter. This was positioned with the tip in the proximal right atrium. Spot chest radiograph shows good positioning and no pneumothorax. Catheter was flushed and sutured externally with 0-Prolene sutures. Patient tolerated the procedure well, with no immediate complication.  IMPRESSION 1. Technically successful right IJ Trialysis catheter placement.  Original Report Authenticated By: Osa Craver, M.D.   Ir US Guide Vasc Access Right  04/18/2012  *RADIOLOGY REPORT*  Clinical history: Acute renal failure, needs access for hemodialysis  RIGHT IJ CATHETER PLACEMENT UNDER ULTRASOUND AND FLUOROSCOPIC GUIDANCE  Technique: The procedure, risks (including but not limited to bleeding, infection, organ damage, pneumothorax), benefits, and alternatives were explained to the patient.  Questions regarding the procedure were encouraged and answered.  The patient understands and consents to the procedure. Patency of the right IJ vein was confirmed with ultrasound with image documentation. An appropriate skin site was determined. Skin site was marked. Region was prepped using maximum barrier technique including cap and mask, sterile gown, sterile gloves, large sterile sheet, and Chlorhexidine as cutaneous antisepsis.  The region was infiltrated locally with 1% lidocaine.  Under real-time ultrasound guidance, the right  IJ vein was accessed with a 19 gauge needle; the needle tip within the vein was confirmed with ultrasound image documentation.   The needle exchanged over a guidewire for vascular dilator which allowed advancement of a 24 cm Trialysis catheter. This was positioned with the tip in the proximal right atrium. Spot chest radiograph shows good positioning and no pneumothorax. Catheter was flushed and sutured externally with 0-Prolene sutures. Patient tolerated the procedure well, with no immediate complication.  IMPRESSION 1. Technically successful right IJ Trialysis catheter placement.   Original Report Authenticated By: Osa Craver, M.D.     Medications:        . allopurinol  100 mg Oral Daily  . calcium carbonate  1 tablet Oral TID WC  . ciprofloxacin  400 mg Intravenous Q24H  . clindamycin (CLEOCIN) IV  300 mg Intravenous Q8H  . darbepoetin (ARANESP) injection - DIALYSIS  40 mcg Intravenous Q Thu-HD  . enoxaparin  30 mg Subcutaneous Q24H  . gabapentin  300 mg Oral QHS  . senna  1 tablet Oral QHS   sodium chloride, sodium chloride, acetaminophen, acetaminophen, acetaminophen-codeine, bisacodyl, feeding supplement (NEPRO CARB STEADY), heparin, hydrALAZINE, lidocaine, lidocaine-prilocaine, ondansetron (ZOFRAN) IV, ondansetron, pentafluoroprop-tetrafluoroeth  Assessment/ Plan:  71 y.o. 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 On presentation he was found to have renal failure with BUN/creatinine 60/10.79 with unknown recent baseline, in setting of ACE, Aleve, ETOH use (chronic), found down in the floor. He was transferred from [facility] to [facility] in case he needs dialysis with worsening renal function.  1. AKI -  Creatinine baseline 0.9 Now with worsening renal failure  2. Metabolic acidosis - resolved 3. HTN/Vol status: controlled  4. Left ankle lesion with swelling, cellulitic change - primary has on clindamycin and ciprofloxacin.  X ray of foot does not show osteomyelitis  4. Gout - reduced allopurinol dose to 100 given renal function.  5. DM - per primary  6. Anemia: Hb dropped to 9.4 today baseline 11.  Check Iron studies  7. Neuropathy - on excessive gabapentin for renal function. Decrease to 300/day max.  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   LOS: 7 Claudell Rhody W @TODAY @10 :29 AM

## 2012-04-20 NOTE — Progress Notes (Signed)
Family concerned about not on [place] patch would like that addressed. Harless Litten.RN

## 2012-04-21 DIAGNOSIS — E1165 Type 2 diabetes mellitus with hyperglycemia: Secondary | ICD-10-CM

## 2012-04-21 DIAGNOSIS — F05 Delirium due to known physiological condition: Secondary | ICD-10-CM

## 2012-04-21 DIAGNOSIS — F0390 Unspecified dementia without behavioral disturbance: Secondary | ICD-10-CM

## 2012-04-21 DIAGNOSIS — I10 Essential (primary) hypertension: Secondary | ICD-10-CM

## 2012-04-21 DIAGNOSIS — N179 Acute kidney failure, unspecified: Secondary | ICD-10-CM

## 2012-04-21 LAB — GLUCOSE, CAPILLARY
Glucose-Capillary: 111 mg/dL — ABNORMAL HIGH (ref 70–99)
Glucose-Capillary: 115 mg/dL — ABNORMAL HIGH (ref 70–99)

## 2012-04-21 LAB — CBC
Hemoglobin: 8.5 g/dL — ABNORMAL LOW (ref 13.0–17.0)
MCH: 30.5 pg (ref 26.0–34.0)
MCHC: 36.8 g/dL — ABNORMAL HIGH (ref 30.0–36.0)
RDW: 14.5 % (ref 11.5–15.5)

## 2012-04-21 LAB — RENAL FUNCTION PANEL
Albumin: 2.7 g/dL — ABNORMAL LOW (ref 3.5–5.2)
Chloride: 89 mEq/L — ABNORMAL LOW (ref 96–112)
GFR calc Af Amer: 3 mL/min — ABNORMAL LOW (ref >90)
GFR calc non Af Amer: 2 mL/min — ABNORMAL LOW (ref >90)
Potassium: 3.9 mEq/L (ref 3.5–5.1)
Sodium: 131 mEq/L — ABNORMAL LOW (ref 135–145)

## 2012-04-21 NOTE — Progress Notes (Signed)
ANTIBIOTIC CONSULT NOTE - FOLLOW-UP  Pharmacy Consult for Ciprofloxacin Indication: L diabetic foot infection / cellulitis  Allergies  Allergen Reactions  . Penicillins Rash    Patient Measurements: Height: 6\' 2"  (188 cm) Weight: 228 lb 8 oz (103.647 kg) IBW/kg (Calculated) : 82.2   Vital Signs: Temp: 97.9 F (36.6 C) (06/02 1310) Temp src: Oral (06/02 1310) BP: 158/77 mmHg (06/02 1310) Pulse Rate: 101  (06/02 1310) Intake/Output from previous day: 06/01 0701 - 06/02 0700 In: 900 [P.O.:900] Out: 1050 [Urine:1050] Intake/Output from this shift: Total I/O In: 200 [P.O.:200] Out: 100 [Urine:100]  Labs:  Basename 04/21/12 0608 04/20/12 0535 04/19/12 0535 04/18/12 2032  WBC 6.0 -- -- 8.3  HGB 8.5* -- -- 10.3*  PLT 88* -- -- 112*  LABCREA -- -- -- --  CREATININE 17.19* 16.14* 15.04* --   Estimated Creatinine Clearance: 5.2 ml/min (by C-G formula based on Cr of 17.19). No results found for this basename: VANCOTROUGH:2,VANCOPEAK:2,VANCORANDOM:2,GENTTROUGH:2,GENTPEAK:2,GENTRANDOM:2,TOBRATROUGH:2,TOBRAPEAK:2,TOBRARND:2,AMIKACINPEAK:2,AMIKACINTROU:2,AMIKACIN:2, in the last 72 hours   Microbiology: No results found for this or any previous visit (from the past 720 hour(s)).  Medical History: Past Medical History  Diagnosis Date  . Diabetes mellitus   . DEMENTIA   . Neuromuscular disorder   . Hypertension   . Gout   . Arthritis     Assessment: 71 y.o. M on day 7 of ciprofloxacin per RX and clindamycin for L foot cellulitis / diabetic foot infection.  No osteomyelitis seen on XRay of foot.  Pt has ARF with rising SCr up to 18.3 today (baseline 0.9).  May need to return to HD soon.  Afebrile, WBC WNL.  No cultures.    Goal of Therapy:  Eradication of Infection  Plan:  1. Continue Cipro 400 mg IV every 24 hours 2. Follow renal function, cultures, LOT, and antibiotic de-escalation plans   Tad Moore 04/21/2012 1:14 PM

## 2012-04-21 NOTE — Progress Notes (Signed)
SUBJECTIVE Gary Mcneil feels ok. He has no complaints. He says he is ready to go home. I have spoken with Dr Hyman Hopes, who would rather wait to see if patient's renal function starts to improve instead of committing him to dialysis long term He is considering renal biopsy. I am in agreement with his plan and I have shared this with Gary Mcneil, who remains very realistic and positive.   1. Altered mental status   2. 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 Renal failure   4. Hypertension     Past Medical History  Diagnosis Date  . Diabetes mellitus   . DEMENTIA   . Neuromuscular disorder   . Hypertension   . Gout   . Arthritis    Current Facility-Administered Medications  Medication Dose Route Frequency Provider Last Rate Last Dose  . 0.9 %  sodium chloride infusion  100 mL Intravenous PRN [name], MD      . 0.9 %  sodium chloride infusion  100 mL Intravenous PRN [name], MD      . acetaminophen (TYLENOL) tablet 650 mg  650 mg Oral Q6H PRN Zannie Cove, MD   650 mg at 04/21/12 0916   Or  . acetaminophen (TYLENOL) suppository 650 mg  650 mg Rectal Q6H PRN Zannie Cove, MD      . acetaminophen-codeine (TYLENOL #3) 300-30 MG per tablet 1 tablet  1 tablet Oral Q8H PRN Zannie Cove, MD   1 tablet at 04/21/12 0820  . allopurinol (ZYLOPRIM) tablet 100 mg  100 mg Oral Daily Sadie Haber, MD   100 mg at 04/21/12 0914  . bisacodyl (DULCOLAX) EC tablet 10 mg  10 mg Oral Daily PRN Kela Millin, MD      . calcium carbonate (TUMS - dosed in mg elemental calcium) chewable tablet 200 mg of elemental calcium  1 tablet Oral TID WC [name], MD   200 mg of elemental calcium at 04/21/12 0818  . ciprofloxacin (CIPRO) IVPB 400 mg  400 mg Intravenous Q24H Ann Held, PHARMD   400 mg at 04/20/12 2000  . clindamycin (CLEOCIN) IVPB 300 mg  300 mg Intravenous Q8H Laveda Norman, MD   300 mg at 04/21/12 1610  . darbepoetin (ARANESP) injection 40 mcg  40 mcg Intravenous Q Thu-HD  [name], MD      . enoxaparin (LOVENOX) injection 30 mg  30 mg Subcutaneous Q24H Zannie Cove, MD   30 mg at 04/20/12 2112  . feeding supplement (NEPRO CARB STEADY) liquid 237 mL  237 mL Oral PRN [name], MD      . gabapentin (NEURONTIN) capsule 300 mg  300 mg Oral QHS Sadie Haber, MD   300 mg at 04/20/12 2112  . heparin injection 1,000 Units  1,000 Units Dialysis PRN [name], MD      . hydrALAZINE (APRESOLINE) injection 10 mg  10 mg Intravenous Q4H PRN Kela Millin, MD   10 mg at 04/20/12 1719  . lidocaine (XYLOCAINE) 1 % injection 5 mL  5 mL Intradermal PRN [name], MD      . lidocaine-prilocaine (EMLA) cream 1 application  1 application Topical PRN [name], MD      . ondansetron [facility]) tablet 4 mg  4 mg Oral Q6H PRN Zannie Cove, MD       Or  . ondansetron (ZOFRAN) injection 4 mg  4 mg Intravenous Q6H PRN Zannie Cove, MD      .  pentafluoroprop-tetrafluoroeth (GEBAUERS) aerosol 1 application  1 application Topical PRN [name], MD      . senna [facility]) tablet 8.6 mg  1 tablet Oral QHS Kela Millin, MD   8.6 mg at 04/20/12 2112   Allergies  Allergen Reactions  . Penicillins Rash   Active Problems:  ARF (acute renal failure)  HTN (hypertension)  Hypoglycemia  DM (diabetes mellitus)   Vital signs in last 24 hours: Temp:  [97.6 F (36.4 C)-98.7 F (37.1 C)] 98 F (36.7 C) (06/02 0900) Pulse Rate:  [91-102] 95  (06/02 0900) Resp:  [17-18] 17  (06/02 0900) BP: (151-176)/(74-102) 166/89 mmHg (06/02 0900) SpO2:  [93 %-100 %] 96 % (06/02 0900) Weight:  [103.647 kg (228 lb 8 oz)] 103.647 kg (228 lb 8 oz) (06/01 2042) Weight change: 2.347 kg (5 lb 2.8 oz) Last BM Date: 04/21/12  Intake/Output from previous day: 06/01 0701 - 06/02 0700 In: 900 [P.O.:900] Out: 1050 [Urine:1050] Intake/Output this shift: Total I/O In: 100 [P.O.:100] Out: 100 [Urine:100]  Lab Results:  [facility] 04/21/12 0608 04/18/12 2032  WBC 6.0 8.3    HGB 8.5* 10.3*  HCT 23.1* 27.3*  PLT 88* 112*   BMET  Basename 04/21/12 0608 04/20/12 0535  NA 131* 132*  K 3.9 3.8  CL 89* 88*  CO2 16* 20  GLUCOSE 95 83  BUN 115* 98*  CREATININE 17.19* 16.14*  CALCIUM 8.6 8.3*    Studies/Results: No results found.  Medications: I have reviewed the patient's current medications.   Physical exam GENERAL- alert HEAD- Hd cath right neck.RESPIRATORY- appears well, vitals normal, no respiratory distress, acyanotic, normal RR, ear and throat exam is normal, neck free of mass or lymphadenopathy, chest clear, no wheezing, crepitations, rhonchi, normal symmetric air entry CVS- regular rate and rhythm, S1, S2 normal, no murmur, click, rub or gallop ABDOMEN- abdomen is soft without significant tenderness, masses, organomegaly or guarding NEURO- Grossly normal EXTREMITIES- extremities normal, atraumatic, no cyanosis or edema  Plan  * ARF (acute renal failure)- Appreciate renal. Little change in creatinine. Defer Mx to Dr Hyman Hopes. ? Kidney biopsy. Watching and waiting for now. * HTN (hypertension)- BP fluctuating, monitor.  * Hypoglycemia/ DM (diabetes mellitus)- hypoglycemia related to renal insufficiency. HbA1c 7. Generally controlled. Continue current mx. * Anemia- defer mx to renal.  * dvt prophylaxis.     Gary Mcneil 04/21/2012 10:25 AM Pager: [phone].

## 2012-04-21 NOTE — Progress Notes (Addendum)
[facility] ROUNDING NOTE   Subjective:   Interval History: none.  Objective:  Vital signs in last 24 hours:  Temp:  [97.6 F (36.4 C)-98.7 F (37.1 C)] 98 F (36.7 C) (06/02 0900) Pulse Rate:  [91-102] 95  (06/02 0900) Resp:  [17-18] 17  (06/02 0900) BP: (151-176)/(74-102) 166/89 mmHg (06/02 0900) SpO2:  [93 %-100 %] 96 % (06/02 0900) Weight:  [103.647 kg (228 lb 8 oz)] 103.647 kg (228 lb 8 oz) (06/01 2042)  Weight change: 2.347 kg (5 lb 2.8 oz) Filed Weights   04/18/12 2015 04/19/12 2020 04/20/12 2042  Weight: 97 kg (213 lb 13.5 oz) 101.3 kg (223 lb 5.2 oz) 103.647 kg (228 lb 8 oz)    Intake/Output: I/O last 3 completed shifts: In: 900 [P.O.:900] Out: 1250 [Urine:1250]   Intake/Output this shift:  Total I/O In: 100 [P.O.:100] Out: 100 [Urine:100]  CVS- RRR RS- CTA ABD- BS present soft non-distended EXT- no edema   Basic Metabolic Panel:  Lab 04/21/12 1478 04/20/12 0535 04/19/12 0535 04/18/12 0600 04/17/12 0841  NA 131* 132* 131* 132* 131*  K 3.9 3.8 3.9 4.0 3.7  CL 89* 88* 88* 85* 87*  CO2 16* 20 20 19  18*  GLUCOSE 95 83 93 78 100*  BUN 115* 98* 87* 97* 92*  CREATININE 17.19* 16.14* 15.04* 18.31* 16.98*  CALCIUM 8.6 8.3* 8.5 -- --  MG -- -- -- -- --  PHOS 10.7* 10.0* 9.1* 10.6* 9.7*    Liver Function Tests:  Lab 04/21/12 0608 04/20/12 0535 04/19/12 0535 04/18/12 0600 04/17/12 0841  AST -- -- -- -- --  ALT -- -- -- -- --  ALKPHOS -- -- -- -- --  BILITOT -- -- -- -- --  PROT -- -- -- -- --  ALBUMIN 2.7* 2.7* 3.0* 3.0* 3.3*   No results found for this basename: LIPASE:5,AMYLASE:5 in the last 168 hours No results found for this basename: AMMONIA:3 in the last 168 hours  CBC:  Lab 04/21/12 0608 04/18/12 2032 04/16/12 0512 04/15/12 1100 04/15/12 0555  WBC 6.0 8.3 5.6 6.3 6.0  NEUTROABS -- -- -- -- --  HGB 8.5* 10.3* 9.4* 11.1* 11.0*  HCT 23.1* 27.3* 26.0* 30.2* 30.2*  MCV 82.8 82.0 82.8 83.4 83.9  PLT 88* 112* 106* 117* PLATELET  CLUMPS NOTED ON SMEAR, COUNT APPEARS DECREASED    Cardiac Enzymes:  Lab 04/14/12 1200  CKTOTAL 4074*  CKMB --  CKMBINDEX --  TROPONINI --    BNP: No components found with this basename: POCBNP:5  CBG:  Lab 04/21/12 0732 04/20/12 2038 04/20/12 1636 04/20/12 1128 04/20/12 0755  GLUCAP 111* 116* 102* 117* 90    Microbiology: No results found for this or any previous visit.  Coagulation Studies: No results found for this basename: LABPROT:5,INR:5 in the last 72 hours  Urinalysis: No results found for this basename: COLORURINE:2,APPERANCEUR:2,LABSPEC:2,PHURINE:2,GLUCOSEU:2,HGBUR:2,BILIRUBINUR:2,KETONESUR:2,PROTEINUR:2,UROBILINOGEN:2,NITRITE:2,LEUKOCYTESUR:2 in the last 72 hours    Imaging: No results found.   Medications:        . allopurinol  100 mg Oral Daily  . calcium carbonate  1 tablet Oral TID WC  . ciprofloxacin  400 mg Intravenous Q24H  . clindamycin (CLEOCIN) IV  300 mg Intravenous Q8H  . darbepoetin (ARANESP) injection - DIALYSIS  40 mcg Intravenous Q Thu-HD  . enoxaparin  30 mg Subcutaneous Q24H  . gabapentin  300 mg Oral QHS  . senna  1 tablet Oral QHS   sodium chloride, sodium chloride, acetaminophen, acetaminophen, acetaminophen-codeine, bisacodyl, feeding supplement (NEPRO CARB  STEADY), heparin, hydrALAZINE, lidocaine, lidocaine-prilocaine, ondansetron (ZOFRAN) IV, ondansetron, pentafluoroprop-tetrafluoroeth  Assessment/ Plan:  71 y.o. 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 On presentation he was found to have renal failure with BUN/creatinine 60/10.79 with unknown recent baseline, in setting of ACE, Aleve, ETOH use (chronic), found down in the floor. He was transferred from [facility] to [facility] in case he needs dialysis with  worsening renal function.  1. AKI - Creatinine baseline 0.9 Now with worsening renal failure  2. Metabolic acidosis - resolved  3. HTN/Vol status: controlled  4. Left ankle lesion with swelling, cellulitic change - primary has on clindamycin and ciprofloxacin.  X ray of foot does not show osteomyelitis  4. Gout - reduced allopurinol dose to 100 given renal function.  5. DM - per primary  6. Anemia: Hb dropped to 9.4 today baseline 11.  Check Iron studies  7. Neuropathy - on excessive gabapentin for renal function. Decrease to 300/day max.  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 May need dialysis in AM   We did receive notes from his PCP ( Dr. Sander Radon ) at [facility] senior care where he had his labs done on 03/22/12. His BUN was found to be 19 and Cr- 0.90.   LOS: 8 Jerriann Schrom W @TODAY @11 :12 AM

## 2012-04-22 DIAGNOSIS — I10 Essential (primary) hypertension: Secondary | ICD-10-CM

## 2012-04-22 DIAGNOSIS — F05 Delirium due to known physiological condition: Secondary | ICD-10-CM

## 2012-04-22 DIAGNOSIS — N179 Acute kidney failure, unspecified: Secondary | ICD-10-CM

## 2012-04-22 DIAGNOSIS — E1165 Type 2 diabetes mellitus with hyperglycemia: Secondary | ICD-10-CM

## 2012-04-22 DIAGNOSIS — F0390 Unspecified dementia without behavioral disturbance: Secondary | ICD-10-CM

## 2012-04-22 LAB — RENAL FUNCTION PANEL
Albumin: 3.2 g/dL — ABNORMAL LOW (ref 3.5–5.2)
BUN: 129 mg/dL — ABNORMAL HIGH (ref 6–23)
CO2: 16 mEq/L — ABNORMAL LOW (ref 19–32)
Calcium: 9.2 mg/dL (ref 8.4–10.5)
Chloride: 87 mEq/L — ABNORMAL LOW (ref 96–112)
Creatinine, Ser: 17.44 mg/dL — ABNORMAL HIGH (ref 0.50–1.35)
GFR calc Af Amer: 3 mL/min — ABNORMAL LOW (ref >90)
GFR calc non Af Amer: 2 mL/min — ABNORMAL LOW (ref >90)
Glucose, Bld: 107 mg/dL — ABNORMAL HIGH (ref 70–99)
Phosphorus: 11.7 mg/dL — ABNORMAL HIGH (ref 2.3–4.6)
Potassium: 3.7 mEq/L (ref 3.5–5.1)
Sodium: 132 mEq/L — ABNORMAL LOW (ref 135–145)

## 2012-04-22 LAB — GLUCOSE, CAPILLARY
Glucose-Capillary: 108 mg/dL — ABNORMAL HIGH (ref 70–99)
Glucose-Capillary: 112 mg/dL — ABNORMAL HIGH (ref 70–99)
Glucose-Capillary: 94 mg/dL (ref 70–99)

## 2012-04-22 MED ORDER — FUROSEMIDE 80 MG PO TABS
160.0000 mg | ORAL_TABLET | Freq: Two times a day (BID) | ORAL | Status: DC
  Administered 2012-04-22 – 2012-04-28 (×12): 160 mg via ORAL
  Filled 2012-04-22 (×14): qty 2

## 2012-04-22 MED ORDER — RENA-VITE PO TABS
1.0000 | ORAL_TABLET | Freq: Every day | ORAL | Status: DC
  Administered 2012-04-22 – 2012-05-04 (×13): 1 via ORAL
  Filled 2012-04-22 (×14): qty 1

## 2012-04-22 MED ORDER — CALCIUM CARBONATE ANTACID 500 MG PO CHEW
2.0000 | CHEWABLE_TABLET | Freq: Three times a day (TID) | ORAL | Status: DC
  Administered 2012-04-22 – 2012-05-06 (×36): 400 mg via ORAL
  Filled 2012-04-22 (×46): qty 2

## 2012-04-22 NOTE — Progress Notes (Signed)
S:feels well  Appetite good no SOB O:BP 181/96  Pulse 97  Temp(Src) 97.1 F (36.2 C) (Oral)  Resp 18  Ht 6\' 2"  (1.88 m)  Wt 100.6 kg (221 lb 12.5 oz)  BMI 28.48 kg/m2  SpO2 97%  Intake/Output Summary (Last 24 hours) at 04/22/12 1211 Last data filed at 04/22/12 0900  Gross per 24 hour  Intake    800 ml  Output    400 ml  Net    400 ml   Weight change: -3.047 kg (-6 lb 11.5 oz) ZOX:WRUEA and alert CVS:RRR Resp:clear Abd:+BS NTND Ext:1-2+ edema NEURO:CNI no asterixis, Ox3      . allopurinol  100 mg Oral Daily  . calcium carbonate  1 tablet Oral TID WC  . ciprofloxacin  400 mg Intravenous Q24H  . clindamycin (CLEOCIN) IV  300 mg Intravenous Q8H  . darbepoetin (ARANESP) injection - DIALYSIS  40 mcg Intravenous Q Thu-HD  . enoxaparin  30 mg Subcutaneous Q24H  . gabapentin  300 mg Oral QHS  . senna  1 tablet Oral QHS   No results found. BMET    Component Value Date/Time   NA 132* 04/22/2012 0720   K 3.7 04/22/2012 0720   CL 87* 04/22/2012 0720   CO2 16* 04/22/2012 0720   GLUCOSE 107* 04/22/2012 0720   BUN 129* 04/22/2012 0720   CREATININE 17.44* 04/22/2012 0720   CALCIUM 9.2 04/22/2012 0720   GFRNONAA 2* 04/22/2012 0720   GFRAA 3* 04/22/2012 0720   CBC    Component Value Date/Time   WBC 6.0 04/21/2012 0608   RBC 2.79* 04/21/2012 0608   HGB 8.5* 04/21/2012 0608   HCT 23.1* 04/21/2012 0608   PLT 88* 04/21/2012 0608   MCV 82.8 04/21/2012 0608   MCH 30.5 04/21/2012 0608   MCHC 36.8* 04/21/2012 0608   RDW 14.5 04/21/2012 0608     Assessment: 1. AFR of ?etiology.  Serologic WU neg.  UA + protein.  Renal fx nl one month ago  Plan: 1. Will watch one more day as he feels well and Scr appears to be peaking.  If Scr higher in am then HD 2. If renal fx does not start to improve soon then may need renal Bx 3. Start po lasix   Gary Mcneil T

## 2012-04-22 NOTE — Evaluation (Signed)
Physical Therapy Evaluation Patient Details Name: Gary Mcneil MRN: [id number] DOB: 10-Mar-1941 Today's Date: 04/22/2012 Time: 4696-2952 PT Time Calculation (min): 31 min  PT Assessment / Plan / Recommendation Clinical Impression  Gary Mcneil is 71 y/o male admitted to [facility] after passing out at home, found to be hypoglycemic and in ARF. Pt also with complaints of a slowly healing ulcer left ankle. Presents to PT today close to baseline mobility with no further acute PT needs however would benefit outpatient physical therapy follow up to address balance deficits and decreased activity tolerance for improved independence and safety at home.  Pt uses his RW and cane at home as needed. Explained the process of outpatient therapy to the patient and he is agreeable. Encouraged pt to continue ambulating with staff while he remains inpatient.     PT Assessment  All further PT needs can be met in the next venue of care    Follow Up Recommendations  Outpatient PT;Supervision - Intermittent    Barriers to Discharge        lEquipment Recommendations  None recommended by PT    Recommendations for Other Services     Frequency      Precautions / Restrictions Precautions Precautions: Fall        Mobility  Transfers Transfers: Sit to Stand;Stand to Sit Sit to Stand: 6: Modified independent (Device/Increase time);With upper extremity assist;From chair/3-in-1;With armrests Stand to Sit: With upper extremity assist;To chair/3-in-1;With armrests Details for Transfer Assistance: slow to rise and increased use of upper extremities to initiate and contol descent  Ambulation/Gait Ambulation/Gait Assistance: 5: Supervision Ambulation Distance (Feet): 300 Feet Assistive device: None Ambulation/Gait Assistance Details: wide stance and decreased step length bilaterally, able to change speeds well when asked, able to turn well with no LOB when asked; difficulty with head turns because of catheter in his neck;  tends to flex forward and at times gets a little forward momentum with trunk flexed and cued to correct that; appears to be slightly antalgic on left and with decreased DF on left at heel strike (will tend to drag both feet but left worse than right)     Exercises     PT Diagnosis: Abnormality of gait;Acute pain  PT Problem List: Decreased activity tolerance;Decreased balance PT Treatment Interventions:     PT Goals    Visit Information  Last PT Received On: 04/22/12 Assistance Needed: +1    Subjective Data   I fell at home, I guess because of this ankle.    Prior Functioning  Home Living Lives With: Significant other Type of Home: House Home Layout: One level Bathroom Shower/Tub: Tub/shower unit;Walk-in shower Bathroom Toilet: Handicapped height Home Adaptive Equipment: Grab bars in shower;Walker - rolling;Grab bars around toilet Prior Function Level of Independence: Independent Driving: Yes Vocation: Retired Musician: No difficulties    Cognition  Overall Cognitive Status: Appears within functional limits for tasks assessed/performed Arousal/Alertness: Awake/alert Orientation Level: Appears intact for tasks assessed Behavior During Session: Gary Mcneil for tasks performed    Extremity/Trunk Assessment Right Upper Extremity Assessment RUE ROM/Strength/Tone: [facility] for tasks assessed Left Upper Extremity Assessment LUE ROM/Strength/Tone: WFL for tasks assessed Right Lower Extremity Assessment RLE ROM/Strength/Tone: Within functional levels RLE Sensation: History of peripheral neuropathy (proprioception impaired) RLE Coordination: WFL - gross/fine motor;WFL - gross motor Left Lower Extremity Assessment LLE ROM/Strength/Tone: Within functional levels LLE Sensation: History of peripheral neuropathy (deficits in proprioception; sway w/ eyes closed Berg task) LLE Coordination: WFL - gross/fine motor;WFL - gross motor  Balance Standardized Balance  Assessment Standardized Balance Assessment: Berg Balance Test Berg Balance Test Sit to Stand: Able to stand  independently using hands Standing Unsupported: Able to stand safely 2 minutes Sitting with Back Unsupported but Feet Supported on Floor or Stool: Able to sit safely and securely 2 minutes Stand to Sit: Controls descent by using hands Transfers: Able to transfer safely, minor use of hands Standing Unsupported with Eyes Closed: Able to stand 10 seconds with supervision Standing Ubsupported with Feet Together: Able to place feet together independently and stand for 1 minute with supervision From Standing, Reach Forward with Outstretched Arm: Can reach forward >5 cm safely (2") From Standing Position, Pick up Object from Floor: Able to pick up shoe, needs supervision From Standing Position, Turn to Look Behind Over each Shoulder: Turn sideways only but maintains balance Turn 360 Degrees: Able to turn 360 degrees safely but slowly Standing Unsupported, Alternately Place Feet on Step/Stool: Able to stand independently and complete 8 steps >20 seconds Standing Unsupported, One Foot in Front: Able to take small step independently and hold 30 seconds Standing on One Leg: Able to lift leg independently and hold equal to or more than 3 seconds Total Score: 40  High Level Balance High Level Balance Activites: Turns  End of Session PT - End of Session Equipment Utilized During Treatment: Gait belt Activity Tolerance: Patient tolerated treatment well;Other (comment) (out of breath with activity) Patient left: in chair;with call bell/phone within reach;with nursing in room;with family/visitor present Nurse Communication: Patient requests pain meds   WHITLOW,Calista Crain HELEN 04/22/2012, 2:24 PM

## 2012-04-22 NOTE — Evaluation (Signed)
Occupational Therapy Evaluation Patient Details Name: Gary Mcneil MRN: [id number] DOB: 07/25/1941 Today's Date: 04/22/2012    OT Assessment / Plan / Recommendation Clinical Impression  Pt presents to OT at baseline with simple ADL activity.Pt does not need further OT services.  Pts has a friend that will assist on DC.    OT Assessment  Patient does not need any further OT services    Follow Up Recommendations  No OT follow up       Equipment Recommendations  None recommended by OT          Precautions / Restrictions Precautions Precautions: None       ADL  Grooming: Simulated;Independent Where Assessed - Grooming: Unsupported standing Upper Body Bathing: Simulated;Independent Where Assessed - Upper Body Bathing: Unsupported sitting Lower Body Bathing: Simulated;Independent Where Assessed - Lower Body Bathing: Unsupported sit to stand Upper Body Dressing: Independent;Simulated Where Assessed - Upper Body Dressing: Unsupported sit to stand Lower Body Dressing: Simulated;Independent Where Assessed - Lower Body Dressing: Unsupported sit to stand Toilet Transfer: Performed;Independent Toilet Transfer Method: Sit to Barista: Comfort height toilet;Grab bars Toileting - Architect and Hygiene: Simulated;Independent Where Assessed - Toileting Clothing Manipulation and Hygiene: Standing Tub/Shower Transfer: Simulated;Independent Tub/Shower Transfer Method: Science writer: Walk in shower;Grab bars ADL Comments: 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        Visit Information  Last OT Received On: 04/22/12       Prior Functioning  Home Living Lives With: Significant other Type of Home: House Home Layout: One level Bathroom Shower/Tub: Tub/shower unit;Walk-in shower Bathroom Toilet: Handicapped height Home Adaptive Equipment: Grab bars in shower;Walker - rolling;Grab bars around toilet Prior  Function Level of Independence: Independent Driving: Yes Vocation: Retired Musician: No difficulties    Cognition  Overall Cognitive Status: Appears within functional limits for tasks assessed/performed Arousal/Alertness: Awake/alert Orientation Level: Appears intact for tasks assessed Behavior During Session: [facility] for tasks performed    Extremity/Trunk Assessment Right Upper Extremity Assessment RUE ROM/Strength/Tone: [facility] for tasks assessed Left Upper Extremity Assessment LUE ROM/Strength/Tone: WFL for tasks assessed   Mobility Transfers Transfers: Sit to Stand;Stand to Sit Sit to Stand: 7: Independent;With armrests Stand to Sit: 7: Independent;With armrests         End of Session OT - End of Session Activity Tolerance: Patient tolerated treatment well Patient left: in chair;with call bell/phone within reach   [facility], [place] Kung 04/22/2012, 1:33 PM

## 2012-04-22 NOTE — Progress Notes (Addendum)
Subjective: Patient sitting in chair, denies any complaints-no nausea vomiting no shortness of breath. Tolerating by mouth well.   Objective: Vital signs in last 24 hours: Temp:  [97.1 F (36.2 C)-98.8 F (37.1 C)] 97.1 F (36.2 C) (06/03 0741) Pulse Rate:  [78-101] 97  (06/03 0741) Resp:  [17-18] 18  (06/03 0741) BP: (158-196)/(77-101) 181/96 mmHg (06/03 0741) SpO2:  [95 %-100 %] 97 % (06/03 0741) Weight:  [100.6 kg (221 lb 12.5 oz)] 100.6 kg (221 lb 12.5 oz) (06/02 2048) Weight change: -3.047 kg (-6 lb 11.5 oz) Last BM Date: 04/22/12  Intake/Output from previous day: 06/02 0701 - 06/03 0700 In: 660 [P.O.:660] Out: 500 [Urine:500] Total I/O In: 240 [P.O.:240] Out: -    Physical Exam: General: alert,Comfortable, communicative, fully oriented, not short of breath at rest.  HEENT:  no conjunctival injection or discharge. Hydration appears fair. NECK:  Supple, JVP not seen, no carotid bruits, no palpable lymphadenopathy, no palpable goiter. CHEST:  Clinically clear to auscultation, no wheezes, no crackles. HEART:  Sounds 1 and 2 heard, normal, regular, no murmurs. ABDOMEN:  Full, soft, non-tender, no palpable organomegaly, no palpable masses, normal bowel sounds. LOWER EXTREMITIES: Slightly deep L>R LE edema- Has abrasions, over left knee and both upper anterior shins. There is a punch biopsy site, about 0.5 cm diameter, which has an eschar, without purulent exudate, and appears to be healing. There is still perifocal erythema, although this is mild  CENTRAL NERVOUS SYSTEM:  No focal neurologic deficit on gross examination.  Lab Results:  [facility] 04/21/12 0608  WBC 6.0  HGB 8.5*  HCT 23.1*  PLT 88*    Basename 04/22/12 0720 04/21/12 0608  NA 132* 131*  K 3.7 3.9  CL 87* 89*  CO2 16* 16*  GLUCOSE 107* 95  BUN 129* 115*  CREATININE 17.44* 17.19*  CALCIUM 9.2 8.6   No results found for this or any previous visit (from the past 240 hour(s)).    Studies/Results: Doppler ultrasound-negative for DVT  Medications: Scheduled Meds:    . allopurinol  100 mg Oral Daily  . calcium carbonate  1 tablet Oral TID WC  . ciprofloxacin  400 mg Intravenous Q24H  . clindamycin (CLEOCIN) IV  300 mg Intravenous Q8H  . darbepoetin (ARANESP) injection - DIALYSIS  40 mcg Intravenous Q Thu-HD  . enoxaparin  30 mg Subcutaneous Q24H  . gabapentin  300 mg Oral QHS  . senna  1 tablet Oral QHS   Continuous Infusions:   PRN Meds:.sodium chloride, sodium chloride, acetaminophen, acetaminophen, acetaminophen-codeine, bisacodyl, feeding supplement (NEPRO CARB STEADY), heparin, hydrALAZINE, lidocaine, lidocaine-prilocaine, ondansetron (ZOFRAN) IV, ondansetron, pentafluoroprop-tetrafluoroeth  Assessment/Plan:  Active Problems:  ARF (acute renal failure): Patient presented with creatine of 10.79 and BUN 60, consistent with ARF. Although he is likely to have a degree of underlying CKD, given his comorbidities of long standing DM/HTN, this is difficult to prove at present, as baseline renal indices are not available. Precipitants, include ACE/diuretic and recent NSAID use, against a background ov vomiting and poor oral intake, pre-admission, likely predisposing ting to dehydration and volume depletion. Renal ultrasound shows no evidence of hydronephrosis, although there was prostatomegaly, and work-up with myeloma screen is in progress. Dr Camille Bal kindly provided renal consultation, and Dr Hyman Hopes is currently following. We are managing as recommended, with avoidance of nephrotoxins, iv fluid administration, with Bicarbonate. Unfortunately, renal function has failed to improve so far, and on 5/29 continuing to trend up-creatinine 16.98, with BUN 92.   -Dialysis initiated  5/30, appreciate renal assistance -Creatinine about the same 17.44 -Patient now started on Lasix per renal, and renal to follow and decide on further dialysis and biopsy 2. Hypoglycemic  episode: Patient presented with altered mental status/confusion, and fell. Fortunately, he only sustained a few abrasions, but no bony injuries. CBG by EMS, was 33, and patient responded to iv D50W, and has since resolved, with iv infusion of D5W. This was due to continued utilization of oral hypoglycemic therapy, in the face of declining GFR. Glipizide remains on hold,  and serial CBGs  Are being monitored. BG remains stable today. 3. DM: Known type 2 diabetic. Pre-admssion control is uncertain. Otherwise, see discussion in #2 above. HBA1C is 7.0. - BG controlled today 4. HTN, Uncontrolled. ACE-i and diuretic are on hold,  ok BP control today, will continue prn Hydralazine not aiming for meticulous control at this time, so as not to worsen renal perfusion.  5. Early dementia: Stable.  6. Mild thrombocytopenia: May be chronic, and has remained stable.  7. Left foot cellulitis: Following skin biopsy, done on 04/09/12. Commenced on iv Ciprofloxacin/Clindamycin on 04/15/12,  (Doxycycline was stopped on 04/15/12, after 2 days of therapy). MRI left foot on 04/15/12, showed nonspecific extensive subcutaneous edema, but no evidence of osteomyelitis or soft tissue abscess.  -LLE significantly more edematous than R. But per Doppler ultrasound done today 5/30 negative for DVT. - continue abx as above, no leukocytosis. 8 Constipation- continue senokot/dulcolax prn  Disposition: Per Renal.   LOS: 9 days   Kristopher Attwood C 04/22/2012, 10:22 AM

## 2012-04-23 ENCOUNTER — Inpatient Hospital Stay (HOSPITAL_COMMUNITY): Payer: Medicare Other

## 2012-04-23 DIAGNOSIS — E1165 Type 2 diabetes mellitus with hyperglycemia: Secondary | ICD-10-CM

## 2012-04-23 DIAGNOSIS — F05 Delirium due to known physiological condition: Secondary | ICD-10-CM

## 2012-04-23 DIAGNOSIS — I10 Essential (primary) hypertension: Secondary | ICD-10-CM

## 2012-04-23 DIAGNOSIS — F0390 Unspecified dementia without behavioral disturbance: Secondary | ICD-10-CM

## 2012-04-23 DIAGNOSIS — N179 Acute kidney failure, unspecified: Secondary | ICD-10-CM

## 2012-04-23 LAB — CBC
Hemoglobin: 9.5 g/dL — ABNORMAL LOW (ref 13.0–17.0)
MCH: 30.8 pg (ref 26.0–34.0)
MCV: 83.1 fL (ref 78.0–100.0)
Platelets: 95 10*3/uL — ABNORMAL LOW (ref 150–400)
RBC: 3.08 MIL/uL — ABNORMAL LOW (ref 4.22–5.81)
WBC: 9 10*3/uL (ref 4.0–10.5)

## 2012-04-23 LAB — GLUCOSE, CAPILLARY: Glucose-Capillary: 154 mg/dL — ABNORMAL HIGH (ref 70–99)

## 2012-04-23 LAB — RENAL FUNCTION PANEL
Albumin: 3 g/dL — ABNORMAL LOW (ref 3.5–5.2)
BUN: 140 mg/dL — ABNORMAL HIGH (ref 6–23)
Calcium: 8.9 mg/dL (ref 8.4–10.5)
Chloride: 90 mEq/L — ABNORMAL LOW (ref 96–112)
Creatinine, Ser: 17.84 mg/dL — ABNORMAL HIGH (ref 0.50–1.35)
GFR calc non Af Amer: 2 mL/min — ABNORMAL LOW (ref >90)

## 2012-04-23 MED ORDER — AMLODIPINE BESYLATE 2.5 MG PO TABS
2.5000 mg | ORAL_TABLET | Freq: Every day | ORAL | Status: DC
  Administered 2012-04-23 – 2012-04-25 (×3): 2.5 mg via ORAL
  Filled 2012-04-23 (×5): qty 1

## 2012-04-23 MED ORDER — LANTHANUM CARBONATE 500 MG PO CHEW
1000.0000 mg | CHEWABLE_TABLET | Freq: Three times a day (TID) | ORAL | Status: DC
  Administered 2012-04-23 – 2012-05-06 (×34): 1000 mg via ORAL
  Filled 2012-04-23 (×44): qty 2

## 2012-04-23 NOTE — Progress Notes (Signed)
S:feels well  Appetite good no SOB O:BP 162/81  Pulse 89  Temp(Src) 98.2 F (36.8 C) (Oral)  Resp 18  Ht 6\' 2"  (1.88 m)  Wt 100.9 kg (222 lb 7.1 oz)  BMI 28.56 kg/m2  SpO2 98%  Intake/Output Summary (Last 24 hours) at 04/23/12 1036 Last data filed at 04/23/12 0909  Gross per 24 hour  Intake   1200 ml  Output   1300 ml  Net   -100 ml   Weight change: 0.3 kg (10.6 oz) ZOX:WRUEA and alert CVS:RRR Resp:clear Abd:+BS NTND Ext:1-2+ edema NEURO:CNI no asterixis, Ox3 mild asterixis      . allopurinol  100 mg Oral Daily  . calcium carbonate  2 tablet Oral TID WC  . ciprofloxacin  400 mg Intravenous Q24H  . clindamycin (CLEOCIN) IV  300 mg Intravenous Q8H  . darbepoetin (ARANESP) injection - DIALYSIS  40 mcg Intravenous Q Thu-HD  . furosemide  160 mg Oral BID  . gabapentin  300 mg Oral QHS  . multivitamin  1 tablet Oral Daily  . senna  1 tablet Oral QHS  . DISCONTD: calcium carbonate  1 tablet Oral TID WC  . DISCONTD: enoxaparin  30 mg Subcutaneous Q24H   No results found. BMET    Component Value Date/Time   NA 132* 04/23/2012 0600   K 4.0 04/23/2012 0600   CL 90* 04/23/2012 0600   CO2 16* 04/23/2012 0600   GLUCOSE 118* 04/23/2012 0600   BUN 140* 04/23/2012 0600   CREATININE 17.84* 04/23/2012 0600   CALCIUM 8.9 04/23/2012 0600   GFRNONAA 2* 04/23/2012 0600   GFRAA 3* 04/23/2012 0600   CBC    Component Value Date/Time   WBC 6.0 04/21/2012 0608   RBC 2.79* 04/21/2012 0608   HGB 8.5* 04/21/2012 0608   HCT 23.1* 04/21/2012 0608   PLT 88* 04/21/2012 0608   MCV 82.8 04/21/2012 0608   MCH 30.5 04/21/2012 0608   MCHC 36.8* 04/21/2012 0608   RDW 14.5 04/21/2012 0608     Assessment: 1. AFR of ?etiology.  Serologic WU neg.  UA + protein.  Renal fx nl one month ago.  Not sure sig of thrombocytopenia/anemia but will check smear to look for schistocytes.  Hg was nl on presentation when Scr was 10.79 Plan: 1. Plan HD today and tomorrow 2. Check peripheral blood smear 3. May need renal bx if renal fx  does not improve 4. Start fosrenol  Lynne Takemoto T

## 2012-04-23 NOTE — Procedures (Signed)
Pt seen on HD.  Ap 50 Vp 70.  Session just getting started. pLan only 2 hr today and will HD again in am.

## 2012-04-23 NOTE — Progress Notes (Signed)
Subjective: Patient denies CP, No SOB, no n/v Objective: Vital signs in last 24 hours: Temp:  [97.1 F (36.2 Mcneil)-98.4 F (36.9 Mcneil)] 97.1 F (36.2 Mcneil) (06/04 1343) Pulse Rate:  [85-100] 100  (06/04 1343) Resp:  [18-20] 20  (06/04 1343) BP: (126-181)/(50-93) 127/50 mmHg (06/04 1343) SpO2:  [98 %-100 %] 98 % (06/04 1343) Weight:  [91.1 kg (200 lb 13.4 oz)-100.9 kg (222 lb 7.1 oz)] 91.1 kg (200 lb 13.4 oz) (06/04 1343) Weight change: 0.3 kg (10.6 oz) Last BM Date: 04/23/12  Intake/Output from previous day: 06/03 0701 - 06/04 0700 In: 1320 [P.O.:1320] Out: 1000 [Urine:1000] Total I/O In: 170 [P.O.:120; IV Piggyback:50] Out: 2400 [Urine:400; Other:2000]   Physical Exam: General: alert,Comfortable, communicative, fully oriented, not short of breath at rest.  HEENT:  no conjunctival injection or discharge. Hydration appears fair. NECK:  Supple, JVP not seen, no carotid bruits, no palpable lymphadenopathy, no palpable goiter. CHEST:  Clinically clear to auscultation, no wheezes, no crackles. HEART:  Sounds 1 and 2 heard, normal, regular, no murmurs. ABDOMEN:  Full, soft, non-tender, no palpable organomegaly, no palpable masses, normal bowel sounds. LOWER EXTREMITIES: Slightly deep L>R LE edema- Has abrasions, over left knee and both upper anterior shins. There is a punch biopsy site, about 0.5 cm diameter, which has an eschar, without purulent exudate, and appears to be healing. There is still perifocal erythema, although this is mild  CENTRAL NERVOUS SYSTEM:  No focal neurologic deficit on gross examination.  Lab Results:  [facility] 04/21/12 0608  WBC 6.0  HGB 8.5*  HCT 23.1*  PLT 88*    Basename 04/23/12 0600 04/22/12 0720  NA 132* 132*  K 4.0 3.7  CL 90* 87*  CO2 16* 16*  GLUCOSE 118* 107*  BUN 140* 129*  CREATININE 17.84* 17.44*  CALCIUM 8.9 9.2   No results found for this or any previous visit (from the past 240 hour(s)).   Studies/Results: Doppler ultrasound-negative  for DVT  Medications: Scheduled Meds:    . allopurinol  100 mg Oral Daily  . calcium carbonate  2 tablet Oral TID WC  . ciprofloxacin  400 mg Intravenous Q24H  . clindamycin (CLEOCIN) IV  300 mg Intravenous Q8H  . darbepoetin (ARANESP) injection - DIALYSIS  40 mcg Intravenous Q Thu-HD  . furosemide  160 mg Oral BID  . gabapentin  300 mg Oral QHS  . lanthanum  1,000 mg Oral TID WC  . multivitamin  1 tablet Oral Daily  . senna  1 tablet Oral QHS   Continuous Infusions:   PRN Meds:.sodium chloride, sodium chloride, acetaminophen, acetaminophen, acetaminophen-codeine, bisacodyl, feeding supplement (NEPRO CARB STEADY), heparin, hydrALAZINE, lidocaine, lidocaine-prilocaine, ondansetron (ZOFRAN) IV, ondansetron, pentafluoroprop-tetrafluoroeth  Assessment/Plan:  Active Problems:  ARF (acute renal failure): Patient presented with creatine of 10.79 and BUN 60, consistent with ARF. Although he is likely to have a degree of underlying CKD, given his comorbidities of long standing DM/HTN, this is difficult to prove at present, as baseline renal indices are not available. Precipitants, include ACE/diuretic and recent NSAID use, against a background ov vomiting and poor oral intake, pre-admission, likely predisposing ting to dehydration and volume depletion. Renal ultrasound shows no evidence of hydronephrosis, although there was prostatomegaly, and work-up with myeloma screen is in progress. Dr Camille Bal kindly provided renal consultation, and Dr Hyman Hopes is currently following. We are managing as recommended, with avoidance of nephrotoxins, iv fluid administration, with Bicarbonate. Unfortunately, renal function has failed to improve so far, and on continuing to  trend up and today 6/4 creatinine 17.84, with BUN 140.   -Dialysis initiated 5/30, appreciate renal assistance -Patient started on Lasix per renal 6/3  -Per renal pt to be dialyzed today 6/4 and in am and may need bx if does not begin to  improve 2. Hypoglycemic episode: Patient presented with altered mental status/confusion, and fell. Fortunately, he only sustained a few abrasions, but no bony injuries. CBG by EMS, was 33, and patient responded to iv D50W, and has since resolved, with iv infusion of D5W. This was due to continued utilization of oral hypoglycemic therapy, in the face of declining GFR. Glipizide remains on hold,  and serial CBGs  Are being monitored. BG remains stable today. 3. DM: Known type 2 diabetic. Pre-admssion control is uncertain. Otherwise, see discussion in #2 above. HBA1C is 7.0. - BG controlled today 4. HTN, Uncontrolled. ACE-i and diuretic dc'ed on admission 2/2 to #1,  Still with poorly controlled will add Norvasc, start with low dose and follow and titrate, continue prn Hydralazine for now -not aiming for meticulous control at this time, so as not to worsen renal perfusion.  5. Early dementia: Stable.  6. thrombocytopenia: unclear etiology, platelet count beginning to trend up, no gross bleeding.  7. Left foot cellulitis: Following skin biopsy, done on 04/09/12. Commenced on iv Ciprofloxacin/Clindamycin on 04/15/12,  (Doxycycline was stopped on 04/15/12, after 2 days of therapy). MRI left foot on 04/15/12, showed nonspecific extensive subcutaneous edema, but no evidence of osteomyelitis or soft tissue abscess.  -LLE significantly more edematous than R. But per Doppler ultrasound done 5/30 negative for DVT. - continue abx as above, no leukocytosis. 8 Constipation- continue senokot/dulcolax prn  Disposition: Per Renal.   LOS: 10 days   Gary Mcneil 04/23/2012, 3:41 PM

## 2012-04-24 ENCOUNTER — Inpatient Hospital Stay (HOSPITAL_COMMUNITY): Payer: Medicare Other

## 2012-04-24 ENCOUNTER — Encounter (HOSPITAL_COMMUNITY): Payer: Self-pay | Admitting: Internal Medicine

## 2012-04-24 DIAGNOSIS — I10 Essential (primary) hypertension: Secondary | ICD-10-CM

## 2012-04-24 DIAGNOSIS — F0390 Unspecified dementia without behavioral disturbance: Secondary | ICD-10-CM

## 2012-04-24 DIAGNOSIS — L02419 Cutaneous abscess of limb, unspecified: Secondary | ICD-10-CM

## 2012-04-24 DIAGNOSIS — K5900 Constipation, unspecified: Secondary | ICD-10-CM | POA: Clinically undetermined

## 2012-04-24 DIAGNOSIS — L0390 Cellulitis, unspecified: Secondary | ICD-10-CM | POA: Diagnosis present

## 2012-04-24 DIAGNOSIS — L03119 Cellulitis of unspecified part of limb: Secondary | ICD-10-CM

## 2012-04-24 DIAGNOSIS — N179 Acute kidney failure, unspecified: Secondary | ICD-10-CM

## 2012-04-24 DIAGNOSIS — E1165 Type 2 diabetes mellitus with hyperglycemia: Secondary | ICD-10-CM

## 2012-04-24 DIAGNOSIS — D696 Thrombocytopenia, unspecified: Secondary | ICD-10-CM | POA: Clinically undetermined

## 2012-04-24 HISTORY — DX: Unspecified dementia, unspecified severity, without behavioral disturbance, psychotic disturbance, mood disturbance, and anxiety: F03.90

## 2012-04-24 LAB — RENAL FUNCTION PANEL
CO2: 20 mEq/L (ref 19–32)
CO2: 19 mEq/L (ref 19–32)
Chloride: 97 mEq/L (ref 96–112)
Chloride: 95 mEq/L — ABNORMAL LOW (ref 96–112)
Creatinine, Ser: 14.63 mg/dL — ABNORMAL HIGH (ref 0.50–1.35)
GFR calc Af Amer: 3 mL/min — ABNORMAL LOW (ref >90)
GFR calc Af Amer: 3 mL/min — ABNORMAL LOW (ref >90)
GFR calc non Af Amer: 3 mL/min — ABNORMAL LOW (ref >90)
Glucose, Bld: 109 mg/dL — ABNORMAL HIGH (ref 70–99)
Glucose, Bld: 107 mg/dL — ABNORMAL HIGH (ref 70–99)
Phosphorus: 8.8 mg/dL — ABNORMAL HIGH (ref 2.3–4.6)
Potassium: 3.2 mEq/L — ABNORMAL LOW (ref 3.5–5.1)
Sodium: 136 mEq/L (ref 135–145)
Sodium: 134 mEq/L — ABNORMAL LOW (ref 135–145)

## 2012-04-24 LAB — GLUCOSE, CAPILLARY
Glucose-Capillary: 107 mg/dL — ABNORMAL HIGH (ref 70–99)
Glucose-Capillary: 119 mg/dL — ABNORMAL HIGH (ref 70–99)

## 2012-04-24 LAB — CBC
HCT: 23.1 % — ABNORMAL LOW (ref 39.0–52.0)
Hemoglobin: 8.8 g/dL — ABNORMAL LOW (ref 13.0–17.0)
Hemoglobin: 8.4 g/dL — ABNORMAL LOW (ref 13.0–17.0)
Platelets: 84 10*3/uL — ABNORMAL LOW (ref 150–400)
RBC: 2.94 MIL/uL — ABNORMAL LOW (ref 4.22–5.81)
RBC: 2.77 MIL/uL — ABNORMAL LOW (ref 4.22–5.81)
WBC: 6.8 10*3/uL (ref 4.0–10.5)

## 2012-04-24 MED ORDER — POTASSIUM CHLORIDE CRYS ER 20 MEQ PO TBCR
40.0000 meq | EXTENDED_RELEASE_TABLET | Freq: Once | ORAL | Status: AC
  Administered 2012-04-24: 40 meq via ORAL
  Filled 2012-04-24: qty 2

## 2012-04-24 NOTE — Progress Notes (Signed)
Off unit.

## 2012-04-24 NOTE — Progress Notes (Signed)
Subjective: Patient states he feels fine. No complaints. Patient asking when he can home.  Objective: Vital signs in last 24 hours: Filed Vitals:   04/24/12 0925 04/24/12 0957 04/24/12 1326 04/24/12 1552  BP: 149/81 139/95 134/81 149/81  Pulse: 89 96 100 96  Temp: 98 F (36.7 C) 97.5 F (36.4 C) 97.4 F (36.3 C) 98.1 F (36.7 C)  TempSrc: Oral Oral Oral Oral  Resp: 18 18 17 18   Height:      Weight: 88.15 kg (194 lb 5.4 oz)     SpO2: 97% 100% 100% 100%    Intake/Output Summary (Last 24 hours) at 04/24/12 1703 Last data filed at 04/24/12 1651  Gross per 24 hour  Intake    600 ml  Output   3269 ml  Net  -2669 ml    Weight change: -0.9 kg (-1 lb 15.8 oz)  General: Alert, awake, oriented x3, in no acute distress. HEENT: No bruits, no goiter. Heart: Regular rate and rhythm, without murmurs, rubs, gallops. Lungs: Clear to auscultation bilaterally. Abdomen: Soft, nontender, nondistended, positive bowel sounds. Extremities: No clubbing cyanosis. 2-3+ BLE pedal edemaThere is a punch biopsy site, about 0.5 cm diameter, which has an eschar, without purulent exudate, and appears to be healing. There is still perifocal erythema, although this is mild  Neuro: Grossly intact, nonfocal.    Lab Results:  Basename 04/24/12 0925 04/24/12 0730 04/24/12 0555  NA -- 134* 136  K -- 3.2* 3.2*  CL -- 95* 97  CO2 -- 19 20  GLUCOSE -- 107* 109*  BUN -- 111* 111*  CREATININE -- 14.26* 14.63*  CALCIUM -- 8.8 9.0  MG 2.1 -- --  PHOS -- 8.8* 9.0*    Basename 04/24/12 0730 04/24/12 0555  AST -- --  ALT -- --  ALKPHOS -- --  BILITOT -- --  PROT -- --  ALBUMIN 3.0* 3.1*   No results found for this basename: LIPASE:2,AMYLASE:2 in the last 72 hours  Basename 04/24/12 0729 04/24/12 0555  WBC 6.7 6.8  NEUTROABS -- --  HGB 8.4* 8.8*  HCT 23.1* 24.3*  MCV 83.4 82.7  PLT 91* 84*   No results found for this basename: CKTOTAL:3,CKMB:3,CKMBINDEX:3,TROPONINI:3 in the last 72 hours No  components found with this basename: POCBNP:3 No results found for this basename: DDIMER:2 in the last 72 hours No results found for this basename: HGBA1C:2 in the last 72 hours No results found for this basename: CHOL:2,HDL:2,LDLCALC:2,TRIG:2,CHOLHDL:2,LDLDIRECT:2 in the last 72 hours No results found for this basename: TSH,T4TOTAL,FREET3,T3FREE,THYROIDAB in the last 72 hours No results found for this basename: VITAMINB12:2,FOLATE:2,FERRITIN:2,TIBC:2,IRON:2,RETICCTPCT:2 in the last 72 hours  Micro Results: No results found for this or any previous visit (from the past 240 hour(s)).  Studies/Results: No results found.  Medications:     . allopurinol  100 mg Oral Daily  . amLODipine  2.5 mg Oral QHS  . calcium carbonate  2 tablet Oral TID WC  . ciprofloxacin  400 mg Intravenous Q24H  . darbepoetin (ARANESP) injection - DIALYSIS  40 mcg Intravenous Q Thu-HD  . furosemide  160 mg Oral BID  . gabapentin  300 mg Oral QHS  . lanthanum  1,000 mg Oral TID WC  . multivitamin  1 tablet Oral Daily  . potassium chloride  40 mEq Oral Once  . senna  1 tablet Oral QHS  . DISCONTD: clindamycin (CLEOCIN) IV  300 mg Intravenous Q8H    Assessment: Principal Problem:  *ARF (acute renal failure) Active Problems:  HTN (  hypertension)  Hypoglycemia  DM (diabetes mellitus)  Cellulitis  Dementia  Thrombocytopenia  Constipation   Plan: #1 ARF (acute renal failure):  Patient presented with creatine of 10.79 and BUN 60, consistent with ARF. Although he is likely to have a degree of underlying CKD, given his comorbidities of long standing DM/HTN, this is difficult to prove at present, as baseline renal indices are not available. Precipitants, include ACE/diuretic and recent NSAID use, against a background ov vomiting and poor oral intake, pre-admission, likely predisposing ting to dehydration and volume depletion. Renal ultrasound shows no evidence of hydronephrosis, although there was prostatomegaly,  and work-up with myeloma screen is in progress. Dr Camille Bal kindly provided renal consultation, and Dr Leanor Kail is currently following. We are managing as recommended, with avoidance of nephrotoxins, iv fluid administration, with Bicarbonate. Unfortunately, renal function has failed to improve so far, and on continuing to trend up and today 6/4 creatinine 14.63, with BUN 111.  -Dialysis initiated 5/30, yesterday and today appreciate renal assistance  -Patient started on Lasix per renal 6/3  -Per renal pt may need bx on Friday if renal function does not begin to improve   2. Hypoglycemic episode:  Patient presented with altered mental status/confusion, and fell. Fortunately, he only sustained a few abrasions, but no bony injuries. CBG by EMS, was 33, and patient responded to iv D50W, and has since resolved, with iv infusion of D5W. This was due to continued utilization of oral hypoglycemic therapy, in the face of declining GFR. Glipizide remains on hold, and serial CBGs Are being monitored.  BG remains stable today.   3. DM:  Known type 2 diabetic. Pre-admssion control is uncertain. Otherwise, see discussion in #2 above. HBA1C is 7.0.  - BG controlled today   4. HTN, Uncontrolled.  ACE-i and diuretic dc'ed on admission 2/2 to #1,   Norvasc added yesterday with some improvement in hypertension,  follow and titrate, continue prn Hydralazine for now -not aiming for meticulous control at this time, so as not to worsen renal perfusion.   5. Early dementia: Stable.   6. thrombocytopenia: unclear etiology, platelet count beginning to trend up, no gross bleeding.   7. Left foot cellulitis: Following skin biopsy, done on 04/09/12. Commenced on iv Ciprofloxacin/Clindamycin on 04/15/12, (Doxycycline was stopped on 04/15/12, after 2 days of therapy). MRI left foot on 04/15/12, showed nonspecific extensive subcutaneous edema, but no evidence of osteomyelitis or soft tissue abscess.  -LLE  significantly more edematous than R. But per Doppler ultrasound done 5/30 negative for DVT.  - Clinical improvement. Will dose continue clindamycin. Continue ciprofloxacin. no leukocytosis.   8 Constipation- continue senokot/dulcolax prn  Disposition: Per Renal.    LOS: 11 days   Warden Buffa [phone] 04/24/2012, 5:03 PM

## 2012-04-24 NOTE — Progress Notes (Signed)
S:feels well  Appetite good no SOB  Pt seen on HD O:BP 141/80  Pulse 95  Temp(Src) 96.9 F (36.1 C) (Oral)  Resp 17  Ht 6\' 2"  (1.88 m)  Wt 90.45 kg (199 lb 6.5 oz)  BMI 25.60 kg/m2  SpO2 97%  Intake/Output Summary (Last 24 hours) at 04/24/12 0937 Last data filed at 04/24/12 0900  Gross per 24 hour  Intake    410 ml  Output   2926 ml  Net  -2516 ml   Weight change: -0.9 kg (-1 lb 15.8 oz) JWJ:XBJYN and alert CVS:RRR Resp:clear Abd:+BS NTND Ext:tr-1+ edema NEURO:CNI no asterixis, Ox3      . allopurinol  100 mg Oral Daily  . amLODipine  2.5 mg Oral QHS  . calcium carbonate  2 tablet Oral TID WC  . ciprofloxacin  400 mg Intravenous Q24H  . clindamycin (CLEOCIN) IV  300 mg Intravenous Q8H  . darbepoetin (ARANESP) injection - DIALYSIS  40 mcg Intravenous Q Thu-HD  . furosemide  160 mg Oral BID  . gabapentin  300 mg Oral QHS  . lanthanum  1,000 mg Oral TID WC  . multivitamin  1 tablet Oral Daily  . potassium chloride  40 mEq Oral Once  . senna  1 tablet Oral QHS   No results found. BMET    Component Value Date/Time   NA 134* 04/24/2012 0730   K 3.2* 04/24/2012 0730   CL 95* 04/24/2012 0730   CO2 19 04/24/2012 0730   GLUCOSE 107* 04/24/2012 0730   BUN 111* 04/24/2012 0730   CREATININE 14.26* 04/24/2012 0730   CALCIUM 8.8 04/24/2012 0730   GFRNONAA 3* 04/24/2012 0730   GFRAA 3* 04/24/2012 0730   CBC    Component Value Date/Time   WBC 6.7 04/24/2012 0729   RBC 2.77* 04/24/2012 0729   HGB 8.4* 04/24/2012 0729   HCT 23.1* 04/24/2012 0729   PLT 91* 04/24/2012 0729   MCV 83.4 04/24/2012 0729   MCH 30.3 04/24/2012 0729   MCHC 36.4* 04/24/2012 0729   RDW 14.5 04/24/2012 0729     Assessment: 1. AFR of ?etiology.  Serologic WU neg.  UA + protein.  Renal fx nl one month ago 2. Anemia/thrombocytopenia  Plan: 1. UO has increased with lasix.  I plan renal bx on Friday if there does not appear to be any recovery and plt ct remains stable and PFA nl. 2.  Recheck labs in AM and may plan another HD  session tomorrow if I decide on BX for Friday.  I discussed the risks of renal BX 1/10 gross hematuria, 1/100 requiring transfusion, 11/998 needed intervention to stop bleeding, 11/4998 nephrectomy.  Diego Ulbricht T

## 2012-04-24 NOTE — Progress Notes (Signed)
ANTIBIOTIC CONSULT NOTE - FOLLOW-UP  Pharmacy Consult for Ciprofloxacin Indication: L diabetic foot infection / cellulitis  Allergies  Allergen Reactions  . Penicillins Rash    Assessment: 71 y.o. M on day 7 of ciprofloxacin per RX and clindamycin for L foot cellulitis / diabetic foot infection.  No osteomyelitis seen on XRay of foot.  Pt has ARF with rising SCr up to 18.3 today (baseline 0.9). Patient received 2 hrs HD yesterday and today, may repeat tomorrow. WBC WNL.  No cultures.    Goal of Therapy:  Eradication of Infection  Plan:  1. Continue Cipro 400 mg IV every 24 hours 2. Follow renal function, LOT, and antibiotic de-escalation plans   Bayard Hugger, PharmD, BCPS  Clinical Pharmacist  Pager: [phone]   04/24/2012 10:47 AM

## 2012-04-25 ENCOUNTER — Inpatient Hospital Stay (HOSPITAL_COMMUNITY): Payer: Medicare Other

## 2012-04-25 DIAGNOSIS — E1165 Type 2 diabetes mellitus with hyperglycemia: Secondary | ICD-10-CM

## 2012-04-25 DIAGNOSIS — I10 Essential (primary) hypertension: Secondary | ICD-10-CM

## 2012-04-25 DIAGNOSIS — L02419 Cutaneous abscess of limb, unspecified: Secondary | ICD-10-CM

## 2012-04-25 DIAGNOSIS — L03119 Cellulitis of unspecified part of limb: Secondary | ICD-10-CM

## 2012-04-25 DIAGNOSIS — N179 Acute kidney failure, unspecified: Secondary | ICD-10-CM

## 2012-04-25 LAB — URINALYSIS, ROUTINE W REFLEX MICROSCOPIC
Bilirubin Urine: NEGATIVE
Ketones, ur: NEGATIVE mg/dL
Nitrite: NEGATIVE
Specific Gravity, Urine: 1.01 (ref 1.005–1.030)
pH: 7.5 (ref 5.0–8.0)

## 2012-04-25 LAB — CBC
HCT: 24.5 % — ABNORMAL LOW (ref 39.0–52.0)
MCHC: 36.7 g/dL — ABNORMAL HIGH (ref 30.0–36.0)
Platelets: 102 10*3/uL — ABNORMAL LOW (ref 150–400)
RDW: 14.4 % (ref 11.5–15.5)
WBC: 7.6 10*3/uL (ref 4.0–10.5)

## 2012-04-25 LAB — RENAL FUNCTION PANEL
Albumin: 3.5 g/dL (ref 3.5–5.2)
CO2: 23 mEq/L (ref 19–32)
Calcium: 9.5 mg/dL (ref 8.4–10.5)
GFR calc Af Amer: 5 mL/min — ABNORMAL LOW (ref >90)
GFR calc Af Amer: 5 mL/min — ABNORMAL LOW (ref >90)
GFR calc non Af Amer: 4 mL/min — ABNORMAL LOW (ref >90)
GFR calc non Af Amer: 4 mL/min — ABNORMAL LOW (ref >90)
Glucose, Bld: 109 mg/dL — ABNORMAL HIGH (ref 70–99)
Glucose, Bld: 102 mg/dL — ABNORMAL HIGH (ref 70–99)
Phosphorus: 6.3 mg/dL — ABNORMAL HIGH (ref 2.3–4.6)
Phosphorus: 6.3 mg/dL — ABNORMAL HIGH (ref 2.3–4.6)
Potassium: 3.1 mEq/L — ABNORMAL LOW (ref 3.5–5.1)
Potassium: 3.2 mEq/L — ABNORMAL LOW (ref 3.5–5.1)
Sodium: 138 mEq/L (ref 135–145)
Sodium: 138 mEq/L (ref 135–145)

## 2012-04-25 LAB — URINE MICROSCOPIC-ADD ON

## 2012-04-25 LAB — DIFFERENTIAL
Basophils Absolute: 0 10*3/uL (ref 0.0–0.1)
Basophils Relative: 0 % (ref 0–1)
Lymphocytes Relative: 13 % (ref 12–46)
Monocytes Absolute: 0.5 10*3/uL (ref 0.1–1.0)
Neutro Abs: 5.9 10*3/uL (ref 1.7–7.7)
Neutrophils Relative %: 78 % — ABNORMAL HIGH (ref 43–77)

## 2012-04-25 LAB — GLUCOSE, CAPILLARY
Glucose-Capillary: 108 mg/dL — ABNORMAL HIGH (ref 70–99)
Glucose-Capillary: 101 mg/dL — ABNORMAL HIGH (ref 70–99)
Glucose-Capillary: 112 mg/dL — ABNORMAL HIGH (ref 70–99)
Glucose-Capillary: 120 mg/dL — ABNORMAL HIGH (ref 70–99)

## 2012-04-25 LAB — PATHOLOGIST SMEAR REVIEW

## 2012-04-25 MED ORDER — CIPROFLOXACIN HCL 500 MG PO TABS
500.0000 mg | ORAL_TABLET | Freq: Every day | ORAL | Status: AC
  Administered 2012-04-25 – 2012-04-27 (×3): 500 mg via ORAL
  Filled 2012-04-25 (×3): qty 1

## 2012-04-25 MED ORDER — DARBEPOETIN ALFA-POLYSORBATE 40 MCG/0.4ML IJ SOLN
INTRAMUSCULAR | Status: AC
  Administered 2012-04-25: 40 ug via INTRAVENOUS
  Filled 2012-04-25: qty 0.4

## 2012-04-25 NOTE — Progress Notes (Signed)
Subjective: Patient states he feels fine. No complaints. Wants to go home.  Objective: Vital signs in last 24 hours: Filed Vitals:   04/25/12 1530 04/25/12 1600 04/25/12 1616 04/25/12 1714  BP: 157/91 152/91 154/87 150/86  Pulse: 97 90 87 106  Temp:   98.4 F (36.9 C) 98.2 F (36.8 C)  TempSrc:   Oral Oral  Resp: 17 16 17 17   Height:      Weight:   86.8 kg (191 lb 5.8 oz)   SpO2:    100%    Intake/Output Summary (Last 24 hours) at 04/25/12 1833 Last data filed at 04/25/12 1616  Gross per 24 hour  Intake    680 ml  Output   1932 ml  Net  -1252 ml    Weight change: -11.85 kg (-26 lb 2 oz)  General: Alert, awake, oriented x3, in no acute distress. HEENT: No bruits, no goiter. Heart: Regular rate and rhythm, without murmurs, rubs, gallops. Lungs: Clear to auscultation bilaterally. Abdomen: Soft, nontender, nondistended, positive bowel sounds. Extremities: No clubbing cyanosis. 2-3+ BLE pedal edemaThere is a punch biopsy site, about 0.5 cm diameter, which has an eschar, without purulent exudate, and appears to be healing. There is decreased perifocal erythema, although this is mild  Neuro: Grossly intact, nonfocal.    Lab Results:  Basename 04/25/12 1345 04/25/12 0605 04/24/12 0925  NA 138 138 --  K 3.2* 3.1* --  CL 96 97 --  CO2 24 23 --  GLUCOSE 102* 109* --  BUN 80* 79* --  CREATININE 11.06* 10.91* --  CALCIUM 9.5 9.1 --  MG -- -- 2.1  PHOS 6.3* 6.3* --    Basename 04/25/12 1345 04/25/12 0605  AST -- --  ALT -- --  ALKPHOS -- --  BILITOT -- --  PROT -- --  ALBUMIN 3.5 3.1*   No results found for this basename: LIPASE:2,AMYLASE:2 in the last 72 hours  Basename 04/25/12 1345 04/24/12 0729  WBC 7.6 6.7  NEUTROABS 5.9 --  HGB 9.0* 8.4*  HCT 24.5* 23.1*  MCV 84.5 83.4  PLT 102* 91*   No results found for this basename: CKTOTAL:3,CKMB:3,CKMBINDEX:3,TROPONINI:3 in the last 72 hours No components found with this basename: POCBNP:3 No results found for  this basename: DDIMER:2 in the last 72 hours No results found for this basename: HGBA1C:2 in the last 72 hours No results found for this basename: CHOL:2,HDL:2,LDLCALC:2,TRIG:2,CHOLHDL:2,LDLDIRECT:2 in the last 72 hours No results found for this basename: TSH,T4TOTAL,FREET3,T3FREE,THYROIDAB in the last 72 hours No results found for this basename: VITAMINB12:2,FOLATE:2,FERRITIN:2,TIBC:2,IRON:2,RETICCTPCT:2 in the last 72 hours  Micro Results: No results found for this or any previous visit (from the past 240 hour(s)).  Studies/Results: No results found.  Medications:     . allopurinol  100 mg Oral Daily  . amLODipine  2.5 mg Oral QHS  . calcium carbonate  2 tablet Oral TID WC  . ciprofloxacin  400 mg Intravenous Q24H  . darbepoetin (ARANESP) injection - DIALYSIS  40 mcg Intravenous Q Thu-HD  . furosemide  160 mg Oral BID  . gabapentin  300 mg Oral QHS  . lanthanum  1,000 mg Oral TID WC  . multivitamin  1 tablet Oral Daily  . senna  1 tablet Oral QHS    Assessment: Principal Problem:  *ARF (acute renal failure) Active Problems:  HTN (hypertension)  Hypoglycemia  DM (diabetes mellitus)  Hyperlipidemia  Cellulitis  Dementia  Thrombocytopenia  Constipation   Plan: #1 ARF (acute renal failure):  Patient presented  with creatine of 10.79 and BUN 60, consistent with ARF. Although he is likely to have a degree of underlying CKD, given his comorbidities of long standing DM/HTN, this is difficult to prove at present, as baseline renal indices are not available. Precipitants, include ACE/diuretic and recent NSAID use, against a background ov vomiting and poor oral intake, pre-admission, likely predisposing ting to dehydration and volume depletion. Renal ultrasound shows no evidence of hydronephrosis, although there was prostatomegaly, and work-up with myeloma screen is in progress. Dr Camille Bal kindly provided renal consultation, and Dr Leanor Kail is currently following. We are  managing as recommended, with avoidance of nephrotoxins, iv fluid administration, with Bicarbonate. Unfortunately, renal function has failed to improve so far, and on continuing to trend up and today 6/4 creatinine 14.63, with BUN 111.  -Dialysis initiated 5/30,  appreciate renal assistance  -Patient started on Lasix per renal 6/3  -Per renal monitor over weekend and they will decide on Monday about biopsy. Patient had HD today.  2. Hypoglycemic episode:  Patient presented with altered mental status/confusion, and fell. Fortunately, he only sustained a few abrasions, but no bony injuries. CBG by EMS, was 33, and patient responded to iv D50W, and has since resolved, with iv infusion of D5W. This was due to continued utilization of oral hypoglycemic therapy, in the face of declining GFR. Glipizide remains on hold, and serial CBGs Are being monitored.  BG remains stable today.   3. DM:  Known type 2 diabetic. Pre-admssion control is uncertain. Otherwise, see discussion in #2 above. HBA1C is 7.0.  - BG controlled today   4. HTN, Uncontrolled.  ACE-i and diuretic dc'ed on admission 2/2 to #1,   Norvasc added yesterday with some improvement in hypertension,  follow and titrate, continue prn Hydralazine for now -not aiming for meticulous control at this time, so as not to worsen renal perfusion.   5. Early dementia: Stable.   6. thrombocytopenia: unclear etiology, platelet count beginning to trend up, no gross bleeding.   7. Left foot cellulitis: Following skin biopsy, done on 04/09/12. Commenced on iv Ciprofloxacin/Clindamycin on 04/15/12, (Doxycycline was stopped on 04/15/12, after 2 days of therapy). MRI left foot on 04/15/12, showed nonspecific extensive subcutaneous edema, but no evidence of osteomyelitis or soft tissue abscess.  -LLE significantly more edematous than R. But per Doppler ultrasound done 5/30 negative for DVT.  - Clinical improvement. Clindamycin d/c'd yesterday. Change ciprofloxacin  to oral route.   8 Constipation- continue senokot/dulcolax prn  Disposition: Per Renal.    LOS: 12 days   Ethelean Colla [phone] 04/25/2012, 6:33 PM

## 2012-04-25 NOTE — Progress Notes (Signed)
S:feels well  Appetite good no SOB threatening to leave hospital O:BP 153/72  Pulse 95  Temp(Src) 97.8 F (36.6 C) (Oral)  Resp 18  Ht 6\' 2"  (1.88 m)  Wt 88.2 kg (194 lb 7.1 oz)  BMI 24.97 kg/m2  SpO2 95%  Intake/Output Summary (Last 24 hours) at 04/25/12 0933 Last data filed at 04/25/12 0600  Gross per 24 hour  Intake    800 ml  Output   1075 ml  Net   -275 ml   Weight change: -11.85 kg (-26 lb 2 oz) ZOX:WRUEA and alert CVS:RRR Resp:clear Abd:+BS NTND Ext: tr-1+ edema NEURO:CNI no asterixis, Ox3 no asterixis      . allopurinol  100 mg Oral Daily  . amLODipine  2.5 mg Oral QHS  . calcium carbonate  2 tablet Oral TID WC  . ciprofloxacin  400 mg Intravenous Q24H  . darbepoetin (ARANESP) injection - DIALYSIS  40 mcg Intravenous Q Thu-HD  . furosemide  160 mg Oral BID  . gabapentin  300 mg Oral QHS  . lanthanum  1,000 mg Oral TID WC  . multivitamin  1 tablet Oral Daily  . potassium chloride  40 mEq Oral Once  . senna  1 tablet Oral QHS  . DISCONTD: clindamycin (CLEOCIN) IV  300 mg Intravenous Q8H   No results found. BMET    Component Value Date/Time   NA 138 04/25/2012 0605   K 3.1* 04/25/2012 0605   CL 97 04/25/2012 0605   CO2 23 04/25/2012 0605   GLUCOSE 109* 04/25/2012 0605   BUN 79* 04/25/2012 0605   CREATININE 10.91* 04/25/2012 0605   CALCIUM 9.1 04/25/2012 0605   GFRNONAA 4* 04/25/2012 0605   GFRAA 5* 04/25/2012 0605   CBC    Component Value Date/Time   WBC 6.7 04/24/2012 0729   RBC 2.77* 04/24/2012 0729   HGB 8.4* 04/24/2012 0729   HCT 23.1* 04/24/2012 0729   PLT 91* 04/24/2012 0729   MCV 83.4 04/24/2012 0729   MCH 30.3 04/24/2012 0729   MCHC 36.4* 04/24/2012 0729   RDW 14.5 04/24/2012 0729     Assessment: 1. AFR of ?etiology.  Serologic WU neg.  UA + protein.  Renal fx nl one month ago.  UO has increased Plan: 1. Plan HD today 2. Check CBC at HD 3. Check PFA in AM 4.  Follow renal fx over weekend and if no improvement then renal BX mon.  Pt agreeable to this and is willing  to stay in hospital Paitynn Mikus T

## 2012-04-25 NOTE — Progress Notes (Signed)
Off unit.

## 2012-04-26 DIAGNOSIS — F0390 Unspecified dementia without behavioral disturbance: Secondary | ICD-10-CM

## 2012-04-26 DIAGNOSIS — IMO0001 Reserved for inherently not codable concepts without codable children: Secondary | ICD-10-CM

## 2012-04-26 DIAGNOSIS — L03119 Cellulitis of unspecified part of limb: Secondary | ICD-10-CM

## 2012-04-26 DIAGNOSIS — E1165 Type 2 diabetes mellitus with hyperglycemia: Secondary | ICD-10-CM

## 2012-04-26 DIAGNOSIS — I10 Essential (primary) hypertension: Secondary | ICD-10-CM

## 2012-04-26 DIAGNOSIS — N179 Acute kidney failure, unspecified: Secondary | ICD-10-CM

## 2012-04-26 DIAGNOSIS — L02419 Cutaneous abscess of limb, unspecified: Secondary | ICD-10-CM

## 2012-04-26 LAB — RENAL FUNCTION PANEL
Albumin: 3.5 g/dL (ref 3.5–5.2)
BUN: 51 mg/dL — ABNORMAL HIGH (ref 6–23)
Chloride: 100 mEq/L (ref 96–112)
GFR calc Af Amer: 8 mL/min — ABNORMAL LOW (ref >90)
Glucose, Bld: 126 mg/dL — ABNORMAL HIGH (ref 70–99)
Potassium: 3.3 mEq/L — ABNORMAL LOW (ref 3.5–5.1)

## 2012-04-26 LAB — GLUCOSE, CAPILLARY
Glucose-Capillary: 109 mg/dL — ABNORMAL HIGH (ref 70–99)
Glucose-Capillary: 128 mg/dL — ABNORMAL HIGH (ref 70–99)

## 2012-04-26 LAB — PLATELET FUNCTION ASSAY: Collagen / Epinephrine: 147 seconds (ref 0–184)

## 2012-04-26 MED ORDER — MEMANTINE HCL 10 MG PO TABS
10.0000 mg | ORAL_TABLET | Freq: Two times a day (BID) | ORAL | Status: DC
  Administered 2012-04-27 – 2012-05-06 (×18): 10 mg via ORAL
  Filled 2012-04-26 (×21): qty 1

## 2012-04-26 MED ORDER — CLONAZEPAM 0.5 MG PO TABS: 0.5000 mg | ORAL_TABLET | Freq: Two times a day (BID) | ORAL | Status: DC | PRN

## 2012-04-26 MED ORDER — AMLODIPINE BESYLATE 5 MG PO TABS
5.0000 mg | ORAL_TABLET | Freq: Every day | ORAL | Status: DC
  Administered 2012-04-26: 5 mg via ORAL
  Filled 2012-04-26 (×2): qty 1

## 2012-04-26 MED ORDER — CLONAZEPAM 0.125 MG PO TBDP: 0.5000 mg | ORAL_TABLET | Freq: Two times a day (BID) | ORAL | Status: DC | PRN

## 2012-04-26 NOTE — Progress Notes (Signed)
Subjective: Patient states he feels fine. No complaints. Was called by nurse practitioner Belia Heman stated that nursing staff called her as patient had become angry and threatened to leave AMA. There was a question of whether patient had capacity for medical decision-making secondary to his acute renal failure and uremia. Spoke with patient concerning need to stay due to his renal function. Explained to patient that if he was to leave AGAINST MEDICAL ADVICE with his current renal function he could more than likely die. Patient states he understands this. Patient has agreed to stay through the weekend until Monday when it will be decided whether he needs a biopsy. Patient states that he will also make a decision on Monday.  Objective: Vital signs in last 24 hours: Filed Vitals:   04/25/12 1616 04/25/12 1714 04/25/12 2200 04/26/12 0552  BP: 154/87 150/86 153/86 161/89  Pulse: 87 106 89 82  Temp: 98.4 F (36.9 C) 98.2 F (36.8 C) 99 F (37.2 C) 98.7 F (37.1 C)  TempSrc: Oral Oral Oral Oral  Resp: 17 17 18 17   Height:      Weight: 86.8 kg (191 lb 5.8 oz)  90.1 kg (198 lb 10.2 oz)   SpO2:  100% 100% 98%    Intake/Output Summary (Last 24 hours) at 04/26/12 0859 Last data filed at 04/26/12 0556  Gross per 24 hour  Intake    360 ml  Output   1182 ml  Net   -822 ml    Weight change: 0.1 kg (3.5 oz)  General: Alert, awake, oriented x3, in no acute distress. HEENT: No bruits, no goiter. Heart: Regular rate and rhythm, without murmurs, rubs, gallops. Lungs: Clear to auscultation bilaterally. Abdomen: Soft, nontender, nondistended, positive bowel sounds. Extremities: No clubbing cyanosis. 2-3+ BLE pedal edema.There is a punch biopsy site, about 0.5 cm diameter, which has an eschar, without purulent exudate, and appears to be healing. There is decreased perifocal erythema, although this is mild  Neuro: Grossly intact, nonfocal.    Lab Results:  Basename 04/25/12 1345 04/25/12  0605 04/24/12 0925  NA 138 138 --  K 3.2* 3.1* --  CL 96 97 --  CO2 24 23 --  GLUCOSE 102* 109* --  BUN 80* 79* --  CREATININE 11.06* 10.91* --  CALCIUM 9.5 9.1 --  MG -- -- 2.1  PHOS 6.3* 6.3* --    Basename 04/25/12 1345 04/25/12 0605  AST -- --  ALT -- --  ALKPHOS -- --  BILITOT -- --  PROT -- --  ALBUMIN 3.5 3.1*   No results found for this basename: LIPASE:2,AMYLASE:2 in the last 72 hours  Basename 04/25/12 1345 04/24/12 0729  WBC 7.6 6.7  NEUTROABS 5.9 --  HGB 9.0* 8.4*  HCT 24.5* 23.1*  MCV 84.5 83.4  PLT 102* 91*   No results found for this basename: CKTOTAL:3,CKMB:3,CKMBINDEX:3,TROPONINI:3 in the last 72 hours No components found with this basename: POCBNP:3 No results found for this basename: DDIMER:2 in the last 72 hours No results found for this basename: HGBA1C:2 in the last 72 hours No results found for this basename: CHOL:2,HDL:2,LDLCALC:2,TRIG:2,CHOLHDL:2,LDLDIRECT:2 in the last 72 hours No results found for this basename: TSH,T4TOTAL,FREET3,T3FREE,THYROIDAB in the last 72 hours No results found for this basename: VITAMINB12:2,FOLATE:2,FERRITIN:2,TIBC:2,IRON:2,RETICCTPCT:2 in the last 72 hours  Micro Results: No results found for this or any previous visit (from the past 240 hour(s)).  Studies/Results: No results found.  Medications:     . allopurinol  100 mg Oral Daily  . amLODipine  2.5 mg Oral QHS  . calcium carbonate  2 tablet Oral TID WC  . ciprofloxacin  500 mg Oral Daily  . darbepoetin (ARANESP) injection - DIALYSIS  40 mcg Intravenous Q Thu-HD  . furosemide  160 mg Oral BID  . gabapentin  300 mg Oral QHS  . lanthanum  1,000 mg Oral TID WC  . multivitamin  1 tablet Oral Daily  . senna  1 tablet Oral QHS  . DISCONTD: ciprofloxacin  400 mg Intravenous Q24H    Assessment: Principal Problem:  *ARF (acute renal failure) Active Problems:  HTN (hypertension)  Hypoglycemia  DM (diabetes mellitus)  Hyperlipidemia  Cellulitis   Dementia  Thrombocytopenia  Constipation   Plan: #1 ARF (acute renal failure):  Patient presented with creatine of 10.79 and BUN 60, consistent with ARF. Although he is likely to have a degree of underlying CKD, given his comorbidities of long standing DM/HTN, this is difficult to prove at present, as baseline renal indices are not available. Precipitants, include ACE/diuretic and recent NSAID use, against a background ov vomiting and poor oral intake, pre-admission, likely predisposing ting to dehydration and volume depletion. Renal ultrasound shows no evidence of hydronephrosis, although there was prostatomegaly, and work-up with myeloma screen is in progress. Dr Camille Bal kindly provided renal consultation, and Dr Leanor Kail is currently following. We are managing as recommended, with avoidance of nephrotoxins, iv fluid administration, with Bicarbonate. Unfortunately, renal function has failed to improve so far, and on continuing to trend up and today 6/4 creatinine 14.63, with BUN 111.  -Dialysis initiated 5/30,  appreciate renal assistance  -Patient started on Lasix per renal 6/3  -Per renal monitor over weekend and they will decide on Monday about biopsy. Patient had HD yesterday, renal to decide on HD today.  2. Hypoglycemic episode:  Patient presented with altered mental status/confusion, and fell. Fortunately, he only sustained a few abrasions, but no bony injuries. CBG by EMS, was 33, and patient responded to iv D50W, and has since resolved, with iv infusion of D5W. This was due to continued utilization of oral hypoglycemic therapy, in the face of declining GFR. Glipizide remains on hold, and serial CBGs Are being monitored.  BG remains stable today.   3. DM:  Known type 2 diabetic. Pre-admssion control is uncertain. Otherwise, see discussion in #2 above. HBA1C is 7.0.  - BG controlled today   4. HTN, Uncontrolled.  ACE-i and diuretic dc'ed on admission 2/2 to #1,   Increase  Norvasc to 5 mg daily ,  follow and titrate, continue prn Hydralazine for now -not aiming for meticulous control at this time, so as not to worsen renal perfusion.   5. Early dementia: Stable.   6. thrombocytopenia: unclear etiology, platelet count beginning to trend up, no gross bleeding.   7. Left foot cellulitis: Following skin biopsy, done on 04/09/12. Commenced on iv Ciprofloxacin/Clindamycin on 04/15/12, (Doxycycline was stopped on 04/15/12, after 2 days of therapy). MRI left foot on 04/15/12, showed nonspecific extensive subcutaneous edema, but no evidence of osteomyelitis or soft tissue abscess.  -LLE significantly more edematous than R. But per Doppler ultrasound done 5/30 negative for DVT.  - Clinical improvement. Clindamycin d/c'd. C continue oral ciprofloxacin(antibiotic day 9/10]   8 Constipation- continue senokot/dulcolax prn  Disposition: Per Renal.  9 disposition Patient per nurse and was angry this morning and threatening to leave AMA. Patient is in acute renal failure and likely uremic as well and likely not making a sound decision. Explained to patient  that leaving AGAINST MEDICAL ADVICE will likely lead to his death out in the field due to his current renal function. Patient is frustrated about being here close to 2 weeks and wants to go home. Explained to patient that renal doctor wanted to see how his renal function does over the weekend and will decide on Monday as to whether he biopsy is needed. Patient has agreed to stay over the weekend and states he will also make a decision on Monday. Will consult with psych to evaluate for capacity.    LOS: 13 days   Comer Devins [phone] 04/26/2012, 8:59 AM

## 2012-04-26 NOTE — Progress Notes (Signed)
S:feels well  Appetite good no SOB threatening to leave hospital O:BP 143/73  Pulse 80  Temp(Src) 98.3 F (36.8 C) (Oral)  Resp 16  Ht 6\' 2"  (1.88 m)  Wt 90.1 kg (198 lb 10.2 oz)  BMI 25.50 kg/m2  SpO2 94%  Intake/Output Summary (Last 24 hours) at 04/26/12 0936 Last data filed at 04/26/12 2536  Gross per 24 hour  Intake    360 ml  Output   1082 ml  Net   -722 ml   Weight change: 0.1 kg (3.5 oz) UYQ:IHKVQ and alert CVS:RRR Resp:clear Abd:+BS NTND Ext: tr-1+ edema NEURO:CNI no asterixis, Ox3 no asterixis      . allopurinol  100 mg Oral Daily  . amLODipine  5 mg Oral QHS  . calcium carbonate  2 tablet Oral TID WC  . ciprofloxacin  500 mg Oral Daily  . darbepoetin (ARANESP) injection - DIALYSIS  40 mcg Intravenous Q Thu-HD  . furosemide  160 mg Oral BID  . gabapentin  300 mg Oral QHS  . lanthanum  1,000 mg Oral TID WC  . multivitamin  1 tablet Oral Daily  . senna  1 tablet Oral QHS  . DISCONTD: amLODipine  2.5 mg Oral QHS  . DISCONTD: ciprofloxacin  400 mg Intravenous Q24H   No results found. BMET    Component Value Date/Time   NA 138 04/25/2012 1345   K 3.2* 04/25/2012 1345   CL 96 04/25/2012 1345   CO2 24 04/25/2012 1345   GLUCOSE 102* 04/25/2012 1345   BUN 80* 04/25/2012 1345   CREATININE 11.06* 04/25/2012 1345   CALCIUM 9.5 04/25/2012 1345   GFRNONAA 4* 04/25/2012 1345   GFRAA 5* 04/25/2012 1345   CBC    Component Value Date/Time   WBC 7.6 04/25/2012 1345   RBC 2.90* 04/25/2012 1345   HGB 9.0* 04/25/2012 1345   HCT 24.5* 04/25/2012 1345   PLT 102* 04/25/2012 1345   MCV 84.5 04/25/2012 1345   MCH 31.0 04/25/2012 1345   MCHC 36.7* 04/25/2012 1345   RDW 14.4 04/25/2012 1345   LYMPHSABS 1.0 04/25/2012 1345   MONOABS 0.5 04/25/2012 1345   EOSABS 0.2 04/25/2012 1345   BASOSABS 0.0 04/25/2012 1345     Assessment: 1. AFR of ?etiology.  Serologic WU neg.  UA + protein.  Renal fx nl one month ago.  UO has increased Plan: 1.Follow renal fx over weekend and if renal fx does not improve then plan  biopsy on Monday. Tentatively scheduled for NOON 2. PFA today 3. Renal panel ordered but not done? 4.  He keeps threatening to leave.  I'm not sure he is completely competent and Psych to see      Larina Lieurance T

## 2012-04-26 NOTE — Consult Note (Signed)
Patient Identification:  Gary Mcneil Date of Evaluation:  04/26/2012 Reason for Consult:  Pt threatens to leave AMA  Referring Provider: Dr. Janee Morn History of Present Illness:Pt came to ED unconscious with CKD and very abnormal labs.  He has been in HD for nearly 2 wks and Dr. Janee Morn explains a renal biopsy is to be done Monday, 6/10.  Any attempt for pt to leave AMA is a serious risk of death due to very poor renal function    Past Psychiatric History:unknown   Past Medical History:     Past Medical History  Diagnosis Date  . Diabetes mellitus   . DEMENTIA   . Neuromuscular disorder   . Hypertension   . Gout   . Arthritis   . Dementia 04/24/2012       Past Surgical History  Procedure Date  . Hernia repair     Allergies:  Allergies  Allergen Reactions  . Penicillins Rash    Current Medications:  Prior to Admission medications   Medication Sig Start Date End Date Taking? Authorizing Provider  acetaminophen-codeine (TYLENOL #3) 300-30 MG per tablet Take 1 tablet by mouth 2 (two) times daily.   Yes Historical Provider, MD  allopurinol (ZYLOPRIM) 300 MG tablet Take 300 mg by mouth 2 (two) times daily.   Yes Historical Provider, MD  gabapentin (NEURONTIN) 600 MG tablet Take 600 mg by mouth 4 (four) times daily.   Yes Historical Provider, MD  glipiZIDE (GLUCOTROL XL) 5 MG 24 hr tablet Take 5 mg by mouth daily.   Yes Historical Provider, MD  lisinopril-hydrochlorothiazide (PRINZIDE,ZESTORETIC) 20-25 MG per tablet Take 1 tablet by mouth daily.   Yes Historical Provider, MD  potassium chloride SA (K-DUR,KLOR-CON) 20 MEQ tablet Take 20 mEq by mouth daily.   Yes Historical Provider, MD  rosuvastatin (CRESTOR) 40 MG tablet Take 40 mg by mouth at bedtime.   Yes Historical Provider, MD  Vitamin D, Ergocalciferol, (DRISDOL) 50000 UNITS CAPS Take 50,000 Units by mouth once a week.   Yes Historical Provider, MD    Social History:    reports that he has never smoked. He does not  have any smokeless tobacco history on file. He reports that he drinks alcohol. He reports that he does not use illicit drugs.   Family History:    Family History  Problem Relation Age of Onset  . Diabetes Mother   . Diabetes Father   . Diabetes Sister   . Hypertension Mother   . Hypertension Father   . Kidney disease Father   . Kidney disease Brother     Mental Status Examination/Evaluation: Objective:  Appearance: Well Groomed  Psychomotor Activity:  Normal  Eye Contact::  Good  Speech:  Clear and Coherent  Volume:  Normal  Mood:  Dysphoric i.e. Impatient because he is not 'doing anything'  Affect:  Congruent  Thought Process:  Coherent, Relevant and and yet at times very unrealistic; argumentative  Orientation:  Full  Thought Content:  Paranoia  Suicidal Thoughts:  No  Homicidal Thoughts:  No  Judgement:  Poor  Insight:  Lacking    DIAGNOSIS:   AXIS I   Dementia mild  AXIS II  Deferred  AXIS III See medical notes.  AXIS IV other psychosocial or environmental problems, problems related to social environment and forgetful about the medical treatment plan  AXIS V 61-70 mild symptoms     Assessment/Plan:  Discussed with Dr. Janee Morn this am and discussed with pt and GF Gary Mcneil this afternoon.  Discussed with Psych CSW Pt is sitting in chair this am and has Nurse, learning disability poodle on his lab.  He says he wants to go home and discussion focused upon his willingness to stay at hospital until Monday when bx can be performed   He is in total agreement.  He says he will cooperated with anything that is needed.  He comments about concerns if he would fall at home ans says his GF would stay if they arranged that.  Dr. Janee Morn is called and told that pt agrees to stay and appears to have capacity to make his decisions.  ~ 3 hours later:  Call from [facility] 6700 the RN says pt is dressed and insists upon leaving.  MD speaks with Gary Mcneil and pt 2Xeach.  Again the importance of staying  for the Biopsy and the consequences of leaving AMA are discussed.  He argues that no one is doing anything for him and he should go home.  He is reminded of his agreement this am and asked again to remain calmly until Monday.  GF Gary Mcneil is on the phone and the purpose of staying; consequences of missing his hemodialysis are discussed with her.  She is asked to stay which would enhance pt's willingness to stay until Monday.  She agrees.  RN @ nurses station is called to report that pt is willing as is the GF to stay this weekend.  (Pt responded as if an ultimatum: All right but this is it!) RECOMMENDATION:  1.  Suggest give pt Klonopin 0.5 mg 2 X daily for agitation for 8th and 9th June. As needed 2.  Apply for IVC if pt insists on leaving AMA because he may have mild dementia and has poor insight about the severity of his kidney disease.  To let him leave is comparable to suicide attempt without his understanding. His repetitious agreement and then objections speaks to his faltering capacity and retention of facts.  3.  Consider Namenda 10 mg 2 X daily and/or Exelon patch 4.6 mg daily.  4. Weekend psychiatrist to follow up.  Krysteena Stalker J. Ferol Luz, MD Psychiatrist  04/26/2012 6:39 PM  ~

## 2012-04-26 NOTE — Progress Notes (Signed)
RN called stating pt is going to leave AMA and is beginning to pull his HD cath out.  Arrived to find patient sitting upright in a recliner. Pt has now agreed to stay. The HD cath remains in place and the sutures are still intact.  The dressing, has been pulled off of the site.  The patient has now agreed to stay and agreed to having another dressing placed over the HD cath.  Pigtail was flushed with good blood return. Sterile procedure done, site cleaned, bio-patch placed and dressing in place without any difficulty.  RN and MD (Dr. Briant Cedar) both are aware that the dressing has been changed. Consuello Masse
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
## 2012-04-26 NOTE — Progress Notes (Cosign Needed)
Called to floor by nursing staff b/c pt was becoming angry and threatening to leave AMA. There was question of whether or not pt has capacity for medical decision making. He certainly is not medically ready for discharge at this time. Difficulty reasoning with pt regarding plan of care and need for inpatient hospitalization. Will ask psych to evaluate to determine capacity. Pt discussed with Dr. Janee Morn.  Cordelia Pen, NP-C [facility]  pgr [phone]

## 2012-04-26 NOTE — Progress Notes (Signed)
Pts nurse reported that patient was dressed, insisting that he was going to leave. Phone call  Placed to Dr. Ferol Luz with psychiatry. Dr. Ferol Luz spoke with patient and girlfriend Britta Mccreedy) via phone and informed patient of the need to stay for biopsy on Monday. Dr. Ferol Luz stated that she would call back and update RN regarding their conversation. Azam Gervasi, Charlyne Quale

## 2012-04-27 DIAGNOSIS — I10 Essential (primary) hypertension: Secondary | ICD-10-CM

## 2012-04-27 DIAGNOSIS — E1165 Type 2 diabetes mellitus with hyperglycemia: Secondary | ICD-10-CM

## 2012-04-27 DIAGNOSIS — N179 Acute kidney failure, unspecified: Secondary | ICD-10-CM

## 2012-04-27 DIAGNOSIS — L02419 Cutaneous abscess of limb, unspecified: Secondary | ICD-10-CM

## 2012-04-27 DIAGNOSIS — R4182 Altered mental status, unspecified: Secondary | ICD-10-CM

## 2012-04-27 DIAGNOSIS — L03119 Cellulitis of unspecified part of limb: Secondary | ICD-10-CM

## 2012-04-27 LAB — RENAL FUNCTION PANEL
Albumin: 3.6 g/dL (ref 3.5–5.2)
Calcium: 9.8 mg/dL (ref 8.4–10.5)
GFR calc Af Amer: 6 mL/min — ABNORMAL LOW (ref >90)
Phosphorus: 5.3 mg/dL — ABNORMAL HIGH (ref 2.3–4.6)
Potassium: 3.5 mEq/L (ref 3.5–5.1)
Sodium: 138 mEq/L (ref 135–145)

## 2012-04-27 LAB — GLUCOSE, CAPILLARY
Glucose-Capillary: 122 mg/dL — ABNORMAL HIGH (ref 70–99)
Glucose-Capillary: 154 mg/dL — ABNORMAL HIGH (ref 70–99)

## 2012-04-27 MED ORDER — AMLODIPINE BESYLATE 5 MG PO TABS
7.5000 mg | ORAL_TABLET | Freq: Every day | ORAL | Status: DC
  Administered 2012-04-27: 7.5 mg via ORAL
  Filled 2012-04-27 (×2): qty 1

## 2012-04-27 NOTE — Progress Notes (Signed)
S:feels well  Appetite good no SOB  He is not saving all his UO O:BP 163/85  Pulse 101  Temp(Src) 98.1 F (36.7 C) (Oral)  Resp 17  Ht 6\' 2"  (1.88 m)  Wt 90.9 kg (200 lb 6.4 oz)  BMI 25.73 kg/m2  SpO2 93%  Intake/Output Summary (Last 24 hours) at 04/27/12 0928 Last data filed at 04/27/12 0600  Gross per 24 hour  Intake    360 ml  Output      0 ml  Net    360 ml   Weight change: 2.65 kg (5 lb 13.5 oz) ZOX:WRUEA and alert CVS:RRR Resp:clear Abd:+BS NTND Ext: no edema NEURO:CNI no asterixis, Ox3 no asterixis      . allopurinol  100 mg Oral Daily  . amLODipine  5 mg Oral QHS  . calcium carbonate  2 tablet Oral TID WC  . ciprofloxacin  500 mg Oral Daily  . darbepoetin (ARANESP) injection - DIALYSIS  40 mcg Intravenous Q Thu-HD  . furosemide  160 mg Oral BID  . gabapentin  300 mg Oral QHS  . lanthanum  1,000 mg Oral TID WC  . memantine  10 mg Oral BID  . multivitamin  1 tablet Oral Daily  . senna  1 tablet Oral QHS   No results found. BMET    Component Value Date/Time   NA 140 04/26/2012 1120   K 3.3* 04/26/2012 1120   CL 100 04/26/2012 1120   CO2 25 04/26/2012 1120   GLUCOSE 126* 04/26/2012 1120   BUN 51* 04/26/2012 1120   CREATININE 7.56* 04/26/2012 1120   CALCIUM 9.5 04/26/2012 1120   GFRNONAA 6* 04/26/2012 1120   GFRAA 8* 04/26/2012 1120   CBC    Component Value Date/Time   WBC 7.6 04/25/2012 1345   RBC 2.90* 04/25/2012 1345   HGB 9.0* 04/25/2012 1345   HCT 24.5* 04/25/2012 1345   PLT 102* 04/25/2012 1345   MCV 84.5 04/25/2012 1345   MCH 31.0 04/25/2012 1345   MCHC 36.7* 04/25/2012 1345   RDW 14.4 04/25/2012 1345   LYMPHSABS 1.0 04/25/2012 1345   MONOABS 0.5 04/25/2012 1345   EOSABS 0.2 04/25/2012 1345   BASOSABS 0.0 04/25/2012 1345     Assessment: 1. AFR of ?etiology.  Serologic WU neg.  UA + protein.  Renal fx nl one month ago.  UO has increased Plan: 1. He seems more agreeable today with the plans 2.  Await labs.  If Scr tomorrow is higher than today then would plan HD Sun in  anticipation of renal biopsy on Mon.  PFA nl.  Pt/ptt nl on 5/30. Plt lowish but at 102 is ok for biopsy.  Would give DDAVP prior to biopsy.      Jemiah Cuadra T

## 2012-04-27 NOTE — Progress Notes (Signed)
Subjective: Patient states he feels fine. No complaints. NO SOB.  Objective: Vital signs in last 24 hours: Filed Vitals:   04/26/12 2145 04/27/12 0513 04/27/12 0940 04/27/12 1330  BP: 167/91 163/85 149/86 152/76  Pulse: 81 101 96 93  Temp: 98.2 F (36.8 C) 98.1 F (36.7 C) 98 F (36.7 C) 97.8 F (36.6 C)  TempSrc: Oral Oral Oral Oral  Resp: 18 17 18 17   Height:      Weight: 90.9 kg (200 lb 6.4 oz)     SpO2: 98% 93% 95% 98%    Intake/Output Summary (Last 24 hours) at 04/27/12 1507 Last data filed at 04/27/12 1300  Gross per 24 hour  Intake    600 ml  Output      0 ml  Net    600 ml    Weight change: 2.65 kg (5 lb 13.5 oz)  General: Alert, awake, oriented x3, in no acute distress. HEENT: No bruits, no goiter. Heart: Regular rate and rhythm, without murmurs, rubs, gallops. Lungs: Clear to auscultation bilaterally. Abdomen: Soft, nontender, nondistended, positive bowel sounds. Extremities: No clubbing cyanosis. 1-2+ BLE pedal edema.There is a punch biopsy site, about 0.5 cm diameter, which has an eschar, without purulent exudate, and appears to be healing. There is decreased perifocal erythema, although this is mild  Neuro: Grossly intact, nonfocal.    Lab Results:  Basename 04/27/12 0817 04/26/12 1120  NA 138 140  K 3.5 3.3*  CL 97 100  CO2 25 25  GLUCOSE 132* 126*  BUN 61* 51*  CREATININE 8.68* 7.56*  CALCIUM 9.8 9.5  MG -- --  PHOS 5.3* 4.1    Basename 04/27/12 0817 04/26/12 1120  AST -- --  ALT -- --  ALKPHOS -- --  BILITOT -- --  PROT -- --  ALBUMIN 3.6 3.5   No results found for this basename: LIPASE:2,AMYLASE:2 in the last 72 hours  Basename 04/25/12 1345  WBC 7.6  NEUTROABS 5.9  HGB 9.0*  HCT 24.5*  MCV 84.5  PLT 102*   No results found for this basename: CKTOTAL:3,CKMB:3,CKMBINDEX:3,TROPONINI:3 in the last 72 hours No components found with this basename: POCBNP:3 No results found for this basename: DDIMER:2 in the last 72 hours No  results found for this basename: HGBA1C:2 in the last 72 hours No results found for this basename: CHOL:2,HDL:2,LDLCALC:2,TRIG:2,CHOLHDL:2,LDLDIRECT:2 in the last 72 hours No results found for this basename: TSH,T4TOTAL,FREET3,T3FREE,THYROIDAB in the last 72 hours No results found for this basename: VITAMINB12:2,FOLATE:2,FERRITIN:2,TIBC:2,IRON:2,RETICCTPCT:2 in the last 72 hours  Micro Results: No results found for this or any previous visit (from the past 240 hour(s)).  Studies/Results: No results found.  Medications:     . allopurinol  100 mg Oral Daily  . amLODipine  5 mg Oral QHS  . calcium carbonate  2 tablet Oral TID WC  . ciprofloxacin  500 mg Oral Daily  . darbepoetin (ARANESP) injection - DIALYSIS  40 mcg Intravenous Q Thu-HD  . furosemide  160 mg Oral BID  . gabapentin  300 mg Oral QHS  . lanthanum  1,000 mg Oral TID WC  . memantine  10 mg Oral BID  . multivitamin  1 tablet Oral Daily  . senna  1 tablet Oral QHS    Assessment: Principal Problem:  *ARF (acute renal failure) Active Problems:  HTN (hypertension)  Hypoglycemia  DM (diabetes mellitus)  Hyperlipidemia  Cellulitis  Dementia  Thrombocytopenia  Constipation   Plan: #1 ARF (acute renal failure):  Patient presented with creatine of  10.79 and BUN 60, consistent with ARF. Although he is likely to have a degree of underlying CKD, given his comorbidities of long standing DM/HTN, this is difficult to prove at present, as baseline renal indices are not available. Precipitants, include ACE/diuretic and recent NSAID use, against a background ov vomiting and poor oral intake, pre-admission, likely predisposing ting to dehydration and volume depletion. Renal ultrasound shows no evidence of hydronephrosis, although there was prostatomegaly, and work-up with myeloma screen is in progress. Dr Camille Bal kindly provided renal consultation, and Dr Leanor Kail is currently following. We are managing as recommended,  with avoidance of nephrotoxins, iv fluid administration, with Bicarbonate. Unfortunately, renal function has failed to improve so far, and on continuing to trend up and  6/4 creatinine 14.63, with BUN 111. Today Cr improving and at 8.68 -Dialysis initiated 5/30,  appreciate renal assistance  -Patient started on Lasix per renal 6/3  -Per renal monitor over weekend and they will decide on Monday about biopsy. Patient had HD 2 days ago, renal to decide on HD tomorrow if Cr higher.Per renal Patient agreeable to staying today.  2. Hypoglycemic episode:  Patient presented with altered mental status/confusion, and fell. Fortunately, he only sustained a few abrasions, but no bony injuries. CBG by EMS, was 33, and patient responded to iv D50W, and has since resolved, with iv infusion of D5W. This was due to continued utilization of oral hypoglycemic therapy, in the face of declining GFR. Glipizide remains on hold, and serial CBGs Are being monitored.  BG remains stable today.   3. DM:  Known type 2 diabetic. Pre-admssion control is uncertain. Otherwise, see discussion in #2 above. HBA1C is 7.0.  - BG controlled today   4. HTN, Uncontrolled.  ACE-i and diuretic dc'ed on admission 2/2 to #1,   Increase Norvasc to 7.5 mg daily ,  follow and titrate, continue prn Hydralazine for now -not aiming for meticulous control at this time, so as not to worsen renal perfusion.   5. Early dementia: Stable. Patient started on Namenda per psych rxcs. Follow.  6. thrombocytopenia: unclear etiology, platelet count beginning to trend up, no gross bleeding.   7. Left foot cellulitis: Following skin biopsy, done on 04/09/12. Commenced on iv Ciprofloxacin/Clindamycin on 04/15/12, (Doxycycline was stopped on 04/15/12, after 2 days of therapy). MRI left foot on 04/15/12, showed nonspecific extensive subcutaneous edema, but no evidence of osteomyelitis or soft tissue abscess.  -LLE significantly more edematous than R. But per  Doppler ultrasound done 5/30 negative for DVT.  - Clinical improvement. Clindamycin d/c'd. C continue oral ciprofloxacin(antibiotic day 10/10]  D/c cipro after today's dose.  8 Constipation- continue senokot/dulcolax prn  Disposition: Per Renal.  9 disposition Patient per nurse and was angry this morning and threatening to leave AMA. Patient is in acute renal failure and likely uremic as well and likely not making a sound decision. Explained to patient that leaving AGAINST MEDICAL ADVICE will likely lead to his death out in the field due to his current renal function. Patient is frustrated about being here close to 2 weeks and wants to go home. Explained to patient that renal doctor wanted to see how his renal function does over the weekend and will decide on Monday as to whether he biopsy is needed. Patient has agreed to stay over the weekend and states he will also make a decision on Monday. Patient seen by psych and feel patient with poor insight into severity of illness, and constant agreement and objection speaks  to his faltering capacity and retention. If patient insists on leaving AMA will need IVC. Appreciate psych input and rxcs.    LOS: 14 days   Gary Mcneil [phone] 04/27/2012, 3:07 PM

## 2012-04-27 NOTE — Progress Notes (Signed)
ANTIBIOTIC CONSULT NOTE - FOLLOW-UP  Pharmacy Consult for Ciprofloxacin Indication: L diabetic foot infection / cellulitis  Allergies  Allergen Reactions  . Penicillins Rash    Assessment: 71 y.o. M on day 13 of ciprofloxacin per RX for L foot cellulitis / diabetic foot infection.  No osteomyelitis seen on XRay of foot.  Pt has ARF with scr still elevated at 8.68 (baseline 0.9). Last HD on 6/6 for 3 hours. Pt is afebrile and there are no available cultures.  Noted per MD note on 6/7. Plans to stop cipro today but has not yet been done.   Goal of Therapy:  Clearance of Infection  Plan:  1. Continue Cipro 500 mg tablet po daily 2. Follow renal function, LOT, and antibiotic de-escalation plans   Thank you,  Brett Fairy, PharmD Pager: [phone]  04/27/2012 1:42 PM

## 2012-04-27 NOTE — Progress Notes (Signed)
Consult Follow Up / Progress Note  04/27/2012 6:08 PM  S/O: Patient seen and evaluated with family. Chart reviewed. Patient stated that his mood was "good". His affect was mood congruent and euthymic.  No current agitation of MS changes and pt calm and cooperative.  Willing to stay through Monday for HD. He denied any current thoughts of self injurious behavior, suicidal ideation or homicidal ideation. He denied any significant depressive signs or symptoms at this time. There were no auditory or visual hallucinations, paranoia, delusional thought processes, or mania noted.  Thought process was linear and goal directed.  No psychomotor agitation or retardation was noted. His speech was normal rate, tone and volume. Eye contact was good. Judgment and insight are fair today.   Vital Signs:Blood pressure 152/76, pulse 93, temperature 97.8 F (36.6 C), temperature source Oral, resp. rate 17, height 6\' 2"  (1.88 m), weight 90.9 kg (200 lb 6.4 oz), SpO2 98.00%.  Current Medications:    . allopurinol  100 mg Oral Daily  . amLODipine  7.5 mg Oral QHS  . calcium carbonate  2 tablet Oral TID WC  . ciprofloxacin  500 mg Oral Daily  . darbepoetin (ARANESP) injection - DIALYSIS  40 mcg Intravenous Q Thu-HD  . furosemide  160 mg Oral BID  . gabapentin  300 mg Oral QHS  . lanthanum  1,000 mg Oral TID WC  . memantine  10 mg Oral BID  . multivitamin  1 tablet Oral Daily  . senna  1 tablet Oral QHS  . DISCONTD: amLODipine  5 mg Oral QHS    Lab Results:  Results for orders placed during the hospital encounter of 04/13/12 (from the past 48 hour(s))  GLUCOSE, CAPILLARY     Status: Abnormal   Collection Time   04/25/12  9:49 PM      Component Value Range Comment   Glucose-Capillary 120 (*) 70 - 99 (mg/dL)   PLATELET FUNCTION ASSAY     Status: Normal   Collection Time   04/26/12  6:45 AM      Component Value Range Comment   Collagen / Epinephrine 147  0 - 184 (seconds) Result repeated and verified.   PFA  Interpretation (NOTE)     RENAL FUNCTION PANEL     Status: Abnormal   Collection Time   04/26/12 11:20 AM      Component Value Range Comment   Sodium 140  135 - 145 (mEq/L)    Potassium 3.3 (*) 3.5 - 5.1 (mEq/L)    Chloride 100  96 - 112 (mEq/L)    CO2 25  19 - 32 (mEq/L)    Glucose, Bld 126 (*) 70 - 99 (mg/dL)    BUN 51 (*) 6 - 23 (mg/dL)    Creatinine, Ser 0.10 (*) 0.50 - 1.35 (mg/dL)    Calcium 9.5  8.4 - 10.5 (mg/dL)    Phosphorus 4.1  2.3 - 4.6 (mg/dL)    Albumin 3.5  3.5 - 5.2 (g/dL)    GFR calc non Af Amer 6 (*) >90 (mL/min)    GFR calc Af Amer 8 (*) >90 (mL/min)   GLUCOSE, CAPILLARY     Status: Abnormal   Collection Time   04/26/12 12:12 PM      Component Value Range Comment   Glucose-Capillary 109 (*) 70 - 99 (mg/dL)   GLUCOSE, CAPILLARY     Status: Abnormal   Collection Time   04/26/12  4:54 PM      Component Value Range Comment  Glucose-Capillary 135 (*) 70 - 99 (mg/dL)   GLUCOSE, CAPILLARY     Status: Abnormal   Collection Time   04/26/12  9:39 PM      Component Value Range Comment   Glucose-Capillary 128 (*) 70 - 99 (mg/dL)   GLUCOSE, CAPILLARY     Status: Abnormal   Collection Time   04/27/12  7:51 AM      Component Value Range Comment   Glucose-Capillary 122 (*) 70 - 99 (mg/dL)    Comment 1 Notify RN     RENAL FUNCTION PANEL     Status: Abnormal   Collection Time   04/27/12  8:17 AM      Component Value Range Comment   Sodium 138  135 - 145 (mEq/L)    Potassium 3.5  3.5 - 5.1 (mEq/L)    Chloride 97  96 - 112 (mEq/L)    CO2 25  19 - 32 (mEq/L)    Glucose, Bld 132 (*) 70 - 99 (mg/dL)    BUN 61 (*) 6 - 23 (mg/dL)    Creatinine, Ser 4.09 (*) 0.50 - 1.35 (mg/dL)    Calcium 9.8  8.4 - 10.5 (mg/dL)    Phosphorus 5.3 (*) 2.3 - 4.6 (mg/dL)    Albumin 3.6  3.5 - 5.2 (g/dL)    GFR calc non Af Amer 5 (*) >90 (mL/min)    GFR calc Af Amer 6 (*) >90 (mL/min)   GLUCOSE, CAPILLARY     Status: Abnormal   Collection Time   04/27/12 11:33 AM      Component Value Range Comment     Glucose-Capillary 130 (*) 70 - 99 (mg/dL)    Comment 1 Notify RN     GLUCOSE, CAPILLARY     Status: Abnormal   Collection Time   04/27/12  4:34 PM      Component Value Range Comment   Glucose-Capillary 101 (*) 70 - 99 (mg/dL)    Comment 1 Documented in Chart      Comment 2 Notify RN       A/P:  Axis I: Cognitive Disorder NOS II: Deferred III: Patient Active Problem List  Diagnoses  . ARF (acute renal failure)  . HTN (hypertension)  . Hypoglycemia  . DM (diabetes mellitus)  . Gout  . Hyperlipidemia  . Arthritis  . Cellulitis  . Dementia  . Thrombocytopenia  . Constipation  IV: Severe V: 45  Pt stable and cooperative at this time.  Willing to stay through Monday for HD.  No acute agitation.  Episodic MS changes noted by family.  Discontinued klonopin which could contribute to delirium.  No other psychiatric changes noted at this time. Pt and family denied any current misuse of alcohol, prescription or illicit drugs.    Lupe Carney 04/27/2012, 6:08 PM

## 2012-04-28 ENCOUNTER — Inpatient Hospital Stay (HOSPITAL_COMMUNITY): Payer: Medicare Other

## 2012-04-28 DIAGNOSIS — I10 Essential (primary) hypertension: Secondary | ICD-10-CM

## 2012-04-28 DIAGNOSIS — E1165 Type 2 diabetes mellitus with hyperglycemia: Secondary | ICD-10-CM

## 2012-04-28 DIAGNOSIS — E876 Hypokalemia: Secondary | ICD-10-CM

## 2012-04-28 DIAGNOSIS — L02419 Cutaneous abscess of limb, unspecified: Secondary | ICD-10-CM

## 2012-04-28 DIAGNOSIS — N179 Acute kidney failure, unspecified: Secondary | ICD-10-CM

## 2012-04-28 DIAGNOSIS — L03119 Cellulitis of unspecified part of limb: Secondary | ICD-10-CM

## 2012-04-28 LAB — BASIC METABOLIC PANEL
CO2: 24 mEq/L (ref 19–32)
Calcium: 9.8 mg/dL (ref 8.4–10.5)
GFR calc non Af Amer: 5 mL/min — ABNORMAL LOW (ref >90)
Glucose, Bld: 133 mg/dL — ABNORMAL HIGH (ref 70–99)
Potassium: 3 mEq/L — ABNORMAL LOW (ref 3.5–5.1)
Sodium: 139 mEq/L (ref 135–145)

## 2012-04-28 LAB — CBC
Hemoglobin: 8.8 g/dL — ABNORMAL LOW (ref 13.0–17.0)
MCH: 29.8 pg (ref 26.0–34.0)
MCV: 85.4 fL (ref 78.0–100.0)
Platelets: 188 10*3/uL (ref 150–400)
RBC: 2.95 MIL/uL — ABNORMAL LOW (ref 4.22–5.81)
WBC: 6.5 10*3/uL (ref 4.0–10.5)

## 2012-04-28 LAB — GLUCOSE, CAPILLARY
Glucose-Capillary: 151 mg/dL — ABNORMAL HIGH (ref 70–99)
Glucose-Capillary: 118 mg/dL — ABNORMAL HIGH (ref 70–99)
Glucose-Capillary: 174 mg/dL — ABNORMAL HIGH (ref 70–99)

## 2012-04-28 MED ORDER — AMLODIPINE BESYLATE 10 MG PO TABS
10.0000 mg | ORAL_TABLET | Freq: Every day | ORAL | Status: DC
  Administered 2012-04-29 – 2012-05-05 (×7): 10 mg via ORAL
  Filled 2012-04-28 (×10): qty 1

## 2012-04-28 MED ORDER — DESMOPRESSIN ACETATE 4 MCG/ML IJ SOLN
25.0000 ug | Freq: Once | INTRAMUSCULAR | Status: DC
  Filled 2012-04-28: qty 6.3

## 2012-04-28 MED ORDER — HEPARIN SODIUM (PORCINE) 1000 UNIT/ML DIALYSIS
20.0000 [IU]/kg | INTRAMUSCULAR | Status: DC | PRN
  Administered 2012-04-28: 1800 [IU] via INTRAVENOUS_CENTRAL

## 2012-04-28 MED ORDER — POTASSIUM CHLORIDE CRYS ER 20 MEQ PO TBCR
40.0000 meq | EXTENDED_RELEASE_TABLET | Freq: Once | ORAL | Status: AC
  Administered 2012-04-28: 40 meq via ORAL

## 2012-04-28 MED ORDER — POTASSIUM CHLORIDE CRYS ER 20 MEQ PO TBCR
EXTENDED_RELEASE_TABLET | ORAL | Status: AC
  Administered 2012-04-28: 40 meq via ORAL
  Filled 2012-04-28: qty 2

## 2012-04-28 NOTE — Progress Notes (Signed)
At approximately 1840 pt started becoming verbally aggressive with this nurse stating that if he didn't hurry up and get something to eat he was gonna leave and go and get him something, motioning to getting out of bed.  At this point I reassured him that his HD snack tray had been ordered but at that point it was out of my hands on how long it took to get from the kitchen.  I then began to re-educate him on the fact that it is not advised to eat during HD tx altogether and explained why, the physiological happenings and why it would not be good.  He stated he didn't care.  I told him I would call to see how much longer it would be, if they had any idea how much longer it would be until his tray got here.  I went to nurses desk to call service response, dialed the number and no one had answered and I had to hang up and call my tech to assist me b/c the pt was coming out of the bed.  We both went to both sides of bed to prevent him from coming out and explain to him what exactly would happen if he came out of the bed while attached to the machine and pulled apart the HD lines.  He said that he didn't care, he was starving.  I offered him graham crackers to snack on until his tray arrived, and that made him more angry stating that is all he has had since noon.  Pt was involuntarily committed today and is not fully oriented.  Pt was physically restrained at this time to prevent pt harm.  At this time I could not get to the desk to get the security pager number so I grabbed the mobile phone at my side and called the operator and told her I need security at HD STAT to prevent staff harm, the pt was physically threatening HD staff at this time.  Security never responded and myself and the tech were not strong enough to keep the pt in the bed and keeping him from harming himself and he was now pushing on me.  I at this time rinsed him back and called Dr. Arlean Hopping to make him aware of what had transpired.  I called the  operator again to request Security at the bedside in HD, she said ok and wanted to connect me to them, I explained that I didn't need to talk to them, I had called and needed them at the bed side STAT already and I need them now.  Security never showed.  We had eventually got the pt deescalated on our on once he was off the machine.  Report was called to the charge nurse on [id number] as we could not wait for the primary nurse to become free to take report.  We immediately returned pt to room after report and left him in low bed, w/ call light at side and charge nurse at bedside.  When we returned back to the HD unit, I completed my documentation and called the house supervisor about security not responding to our call, this was the outgoing day shift AC.  A few minutes later the night shift supervisor returned my call and assured me that she was looking into why security did not respond to our call for help x2 and that security had returned her page immediately, and that perhaps the error was w/the operator and she would look into  it.

## 2012-04-28 NOTE — Progress Notes (Addendum)
S:  B/Cr up today at 67 / 9.18.  UOP not recorded.  Weight down yest 88kg (admit 90, peak 103kg).   O:BP 150/91  Pulse 81  Temp(Src) 97.8 F (36.6 C) (Oral)  Resp 19  Ht 6\' 2"  (1.88 m)  Wt 87.9 kg (193 lb 12.6 oz)  BMI 24.88 kg/m2  SpO2 99%  Intake/Output Summary (Last 24 hours) at 04/28/12 0905 Last data filed at 04/27/12 1700  Gross per 24 hour  Intake    240 ml  Output      0 ml  Net    240 ml   Weight change: -3 kg (-6 lb 9.8 oz) NGE:XBMWU and alert [facility]:RRR Resp:clear Abd:+BS NTND Ext: no edema NEURO:CNI no asterixis, Ox3 no asterixis      . allopurinol  100 mg Oral Daily  . amLODipine  10 mg Oral QHS  . calcium carbonate  2 tablet Oral TID WC  . ciprofloxacin  500 mg Oral Daily  . darbepoetin (ARANESP) injection - DIALYSIS  40 mcg Intravenous Q Thu-HD  . furosemide  160 mg Oral BID  . gabapentin  300 mg Oral QHS  . lanthanum  1,000 mg Oral TID WC  . memantine  10 mg Oral BID  . multivitamin  1 tablet Oral Daily  . senna  1 tablet Oral QHS  . DISCONTD: amLODipine  5 mg Oral QHS  . DISCONTD: amLODipine  7.5 mg Oral QHS   No results found. BMET    Component Value Date/Time   NA 139 04/28/2012 0625   K 3.0* 04/28/2012 0625   CL 97 04/28/2012 0625   CO2 24 04/28/2012 0625   GLUCOSE 133* 04/28/2012 0625   BUN 67* 04/28/2012 0625   CREATININE 9.18* 04/28/2012 0625   CALCIUM 9.8 04/28/2012 0625   GFRNONAA 5* 04/28/2012 0625   GFRAA 6* 04/28/2012 0625   CBC    Component Value Date/Time   WBC 6.5 04/28/2012 0625   RBC 2.95* 04/28/2012 0625   HGB 8.8* 04/28/2012 0625   HCT 25.2* 04/28/2012 0625   PLT 188 04/28/2012 0625   MCV 85.4 04/28/2012 0625   MCH 29.8 04/28/2012 0625   MCHC 34.9 04/28/2012 0625   RDW 14.2 04/28/2012 0625   LYMPHSABS 1.0 04/25/2012 1345   MONOABS 0.5 04/25/2012 1345   EOSABS 0.2 04/25/2012 1345   BASOSABS 0.0 04/25/2012 1345     Assessment: 1. AFR of ?etiology.  Serologic WU neg.  UA + protein.  Renal fx nl one month ago.  UO has increased, but is not being recorded.  Plan for HD today and renal biopsy tomorrow.  Explained to patient the situation to pt in detail and he is willing to stay. In particular I told him that currently the outpt HD units won't accept AKI patients unless they have been on inpatient dialysis for 30 days, so there's no way we can discharge him to outpatient dialysis at this time.  Also will stop lasix po 160 mg bid, no vol excess currently.   Plan: 1. HD today for 3.5 hours 2. Renal biopsy tomorrow, DDAVP at 11am , biopsy at 1pm.   3. D/C lasix  Vinson Moselle  MD [facility] [phone] pgr    [phone] cell 04/28/2012, 2:19 PM

## 2012-04-28 NOTE — Progress Notes (Addendum)
Subjective: Patient states he feels fine. No complaints. NO SOB. Per nursing patient threatened to leave AMA this morning. Came and spoke with the patient patient states that he is tired of being here and wants to go home feels that he may be able to go back and forth between home and the hospital if need be. Patient does not quite understand the gravity of his current medical situation. Patient states that we can't stop him from leaving if he decides to leave. Patient is somewhat in agreement. We'll monitor for now.  Objective: Vital signs in last 24 hours: Filed Vitals:   04/27/12 1330 04/27/12 1700 04/27/12 2142 04/28/12 0505  BP: 152/76 161/86 143/83 150/91  Pulse: 93 85 85 81  Temp: 97.8 F (36.6 C) 97.9 F (36.6 C) 98.3 F (36.8 C) 97.8 F (36.6 C)  TempSrc: Oral Oral Oral Oral  Resp: 17 18 17 19   Height:      Weight:   87.9 kg (193 lb 12.6 oz)   SpO2: 98% 98% 99% 99%    Intake/Output Summary (Last 24 hours) at 04/28/12 0941 Last data filed at 04/27/12 1700  Gross per 24 hour  Intake    240 ml  Output      0 ml  Net    240 ml    Weight change: -3 kg (-6 lb 9.8 oz)  General: Alert, awake, oriented x3, in no acute distress. HEENT: No bruits, no goiter. Heart: Regular rate and rhythm, without murmurs, rubs, gallops. Lungs: Clear to auscultation bilaterally. Abdomen: Soft, nontender, nondistended, positive bowel sounds. Extremities: No clubbing cyanosis. 1+ BLE pedal edema.There is a punch biopsy site, about 0.5 cm diameter, which has an eschar, without purulent exudate, and appears to be healing. There is decreased perifocal erythema, although this is mild  Neuro: Grossly intact, nonfocal.    Lab Results:  Basename 04/28/12 0625 04/27/12 0817 04/26/12 1120  NA 139 138 --  K 3.0* 3.5 --  CL 97 97 --  CO2 24 25 --  GLUCOSE 133* 132* --  BUN 67* 61* --  CREATININE 9.18* 8.68* --  CALCIUM 9.8 9.8 --  MG -- -- --  PHOS -- 5.3* 4.1    Basename 04/27/12 0817  04/26/12 1120  AST -- --  ALT -- --  ALKPHOS -- --  BILITOT -- --  PROT -- --  ALBUMIN 3.6 3.5   No results found for this basename: LIPASE:2,AMYLASE:2 in the last 72 hours  Basename 04/28/12 0625 04/25/12 1345  WBC 6.5 7.6  NEUTROABS -- 5.9  HGB 8.8* 9.0*  HCT 25.2* 24.5*  MCV 85.4 84.5  PLT 188 102*   No results found for this basename: CKTOTAL:3,CKMB:3,CKMBINDEX:3,TROPONINI:3 in the last 72 hours No components found with this basename: POCBNP:3 No results found for this basename: DDIMER:2 in the last 72 hours No results found for this basename: HGBA1C:2 in the last 72 hours No results found for this basename: CHOL:2,HDL:2,LDLCALC:2,TRIG:2,CHOLHDL:2,LDLDIRECT:2 in the last 72 hours No results found for this basename: TSH,T4TOTAL,FREET3,T3FREE,THYROIDAB in the last 72 hours No results found for this basename: VITAMINB12:2,FOLATE:2,FERRITIN:2,TIBC:2,IRON:2,RETICCTPCT:2 in the last 72 hours  Micro Results: No results found for this or any previous visit (from the past 240 hour(s)).  Studies/Results: No results found.  Medications:     . allopurinol  100 mg Oral Daily  . amLODipine  10 mg Oral QHS  . calcium carbonate  2 tablet Oral TID WC  . ciprofloxacin  500 mg Oral Daily  . darbepoetin (ARANESP) injection -  DIALYSIS  40 mcg Intravenous Q Thu-HD  . furosemide  160 mg Oral BID  . gabapentin  300 mg Oral QHS  . lanthanum  1,000 mg Oral TID WC  . memantine  10 mg Oral BID  . multivitamin  1 tablet Oral Daily  . senna  1 tablet Oral QHS  . DISCONTD: amLODipine  5 mg Oral QHS  . DISCONTD: amLODipine  7.5 mg Oral QHS    Assessment: Principal Problem:  *ARF (acute renal failure) Active Problems:  HTN (hypertension)  Hypoglycemia  DM (diabetes mellitus)  Hyperlipidemia  Cellulitis  Dementia  Thrombocytopenia  Constipation   Plan: #1 ARF (acute renal failure):  Patient presented with creatine of 10.79 and BUN 60, consistent with ARF. Although he is likely to  have a degree of underlying CKD, given his comorbidities of long standing DM/HTN, this is difficult to prove at present, as baseline renal indices are not available. Precipitants, include ACE/diuretic and recent NSAID use, against a background ov vomiting and poor oral intake, pre-admission, likely predisposing ting to dehydration and volume depletion. Renal ultrasound shows no evidence of hydronephrosis, although there was prostatomegaly, and work-up with myeloma screen is in progress. Dr Camille Bal kindly provided renal consultation, and Dr Leanor Kail is currently following. We are managing as recommended, with avoidance of nephrotoxins, iv fluid administration, with Bicarbonate. Unfortunately, renal function has failed to improve so far, and on continuing to trend up and  6/4 creatinine 14.63, with BUN 111. Today Cr worse than yesterday and currently at 9.18 from 8.68. -Dialysis initiated 5/30,  appreciate renal assistance  -Patient started on Lasix per renal 6/3  -Per renal monitor over weekend and they will decide on Monday about biopsy. Patient had HD 3 days ago, renal to decide on HD today .Per renal Patient threatening to leave AMA this morning. Came and spoke with patient at length concerning his current situation and that leaving would be committing suicide. I explained to patient that if he decides to leave AGAINST MEDICAL ADVICE will likely need to involuntary commit him. Psychiatry agrees with this per the consult note.  2. Hypoglycemic episode:  Patient presented with altered mental status/confusion, and fell. Fortunately, he only sustained a few abrasions, but no bony injuries. CBG by EMS, was 33, and patient responded to iv D50W, and has since resolved, with iv infusion of D5W. This was due to continued utilization of oral hypoglycemic therapy, in the face of declining GFR. Glipizide remains on hold, and serial CBGs Are being monitored.  BG remains stable today.   3. DM:  Known  type 2 diabetic. Pre-admssion control is uncertain. Otherwise, see discussion in #2 above. HBA1C is 7.0.  - BG controlled today   4. HTN, Uncontrolled.  ACE-i and diuretic dc'ed on admission 2/2 to #1,   Increase Norvasc to 10 mg daily ,  follow and titrate, continue prn Hydralazine for now -not aiming for meticulous control at this time, so as not to worsen renal perfusion.   5. Early dementia: Stable. Patient started on Namenda per psych rxcs. Follow.  6. thrombocytopenia: unclear etiology, platelet count beginning to trend up, no gross bleeding.   7. Left foot cellulitis: Following skin biopsy, done on 04/09/12. Commenced on iv Ciprofloxacin/Clindamycin on 04/15/12, (Doxycycline was stopped on 04/15/12, after 2 days of therapy). MRI left foot on 04/15/12, showed nonspecific extensive subcutaneous edema, but no evidence of osteomyelitis or soft tissue abscess.  -LLE significantly more edematous than R. But per Doppler ultrasound done 5/30  negative for DVT.  - Clinical improvement. Clindamycin d/c'd. C continue oral ciprofloxacin(antibiotic day 10/10]  D/c'd cipro yesterday.  8 Constipation- continue senokot/dulcolax prn  Disposition: Per Renal.  9 disposition Patient per nurse and was angry this morning and threatening to leave AMA. Patient is in acute renal failure and likely uremic as well and likely not making a sound decision. Explained to patient that leaving AGAINST MEDICAL ADVICE will likely lead to his death out in the field due to his current renal function. Patient is frustrated about being here for 2 weeks and wants to go home. Explained to patient that renal doctor wanted to see how his renal function does over the weekend and will decide on Monday as to whether he biopsy is needed. Patient states that when he wants to leave we will not be able to stop him. Patient seen by psych and feel patient with poor insight into severity of illness, and constant agreement and objection speaks to  his faltering capacity and retention. If patient insists on leaving AMA will need IVC. Appreciate psych input and rxcs.    LOS: 15 days   Bow Buntyn [phone] 04/28/2012, 9:41 AM  #10 hypokalemia We'll give a dose of oral potassium today. Will also defer to renal.

## 2012-04-28 NOTE — Progress Notes (Signed)
Pt threatening to leave AMA this morning (which would likely result in his death, per MD) but has not attempted since. Pt deemed to lack capacity by psych. MD requesting pt be involuntarily committed to hospital bed to ensure pt . CSW completed petition for IVC ppw. Magistrate confirmed they received petition and have issued order for IVC. [employer] will bring order for IVC to serve pt.  RN aware to place IVC order in pt's shadow chart. IVC will expire after 7 days.  If pt medically cleared before 7 days and able to return home, contact unit CSW to initiate termination of IVC ppw. Dellie Burns, MSW, [place] [phone] (weekend)

## 2012-04-28 NOTE — Progress Notes (Signed)
Patients significant other is requesting medication for his agitation. RN contacted called Dr. Koren Shiver and left message. Dr. Janee Morn notified RN was unable to contact [facility].

## 2012-04-29 ENCOUNTER — Encounter (HOSPITAL_COMMUNITY): Payer: Self-pay | Admitting: Radiology

## 2012-04-29 ENCOUNTER — Inpatient Hospital Stay (HOSPITAL_COMMUNITY): Payer: Medicare Other

## 2012-04-29 DIAGNOSIS — N179 Acute kidney failure, unspecified: Secondary | ICD-10-CM

## 2012-04-29 DIAGNOSIS — L02419 Cutaneous abscess of limb, unspecified: Secondary | ICD-10-CM

## 2012-04-29 DIAGNOSIS — E1165 Type 2 diabetes mellitus with hyperglycemia: Secondary | ICD-10-CM

## 2012-04-29 DIAGNOSIS — L03119 Cellulitis of unspecified part of limb: Secondary | ICD-10-CM

## 2012-04-29 DIAGNOSIS — I10 Essential (primary) hypertension: Secondary | ICD-10-CM

## 2012-04-29 LAB — RENAL FUNCTION PANEL
Albumin: 3.5 g/dL (ref 3.5–5.2)
CO2: 29 mEq/L (ref 19–32)
Calcium: 9.1 mg/dL (ref 8.4–10.5)
Chloride: 99 mEq/L (ref 96–112)
Creatinine, Ser: 6.4 mg/dL — ABNORMAL HIGH (ref 0.50–1.35)
GFR calc Af Amer: 9 mL/min — ABNORMAL LOW (ref >90)
GFR calc non Af Amer: 8 mL/min — ABNORMAL LOW (ref >90)
Sodium: 140 mEq/L (ref 135–145)

## 2012-04-29 LAB — CBC
MCH: 29.9 pg (ref 26.0–34.0)
MCHC: 34.7 g/dL (ref 30.0–36.0)
Platelets: 239 10*3/uL (ref 150–400)
RDW: 14.2 % (ref 11.5–15.5)

## 2012-04-29 LAB — GLUCOSE, CAPILLARY
Glucose-Capillary: 115 mg/dL — ABNORMAL HIGH (ref 70–99)
Glucose-Capillary: 122 mg/dL — ABNORMAL HIGH (ref 70–99)

## 2012-04-29 MED ORDER — DESMOPRESSIN ACETATE 4 MCG/ML IJ SOLN
25.0000 ug | Freq: Once | INTRAMUSCULAR | Status: AC
  Administered 2012-04-29: 25 ug via INTRAVENOUS
  Filled 2012-04-29 (×2): qty 6.3

## 2012-04-29 MED ORDER — LORAZEPAM 2 MG/ML IJ SOLN
0.5000 mg | Freq: Two times a day (BID) | INTRAMUSCULAR | Status: DC | PRN
  Administered 2012-04-29 – 2012-05-03 (×3): 0.5 mg via INTRAVENOUS
  Filled 2012-04-29 (×3): qty 1

## 2012-04-29 MED ORDER — LORAZEPAM 2 MG/ML IJ SOLN
INTRAMUSCULAR | Status: AC
  Administered 2012-04-29: 0.5 mg via INTRAVENOUS
  Filled 2012-04-29: qty 1

## 2012-04-29 MED ORDER — LORAZEPAM 1 MG PO TABS
1.0000 mg | ORAL_TABLET | Freq: Three times a day (TID) | ORAL | Status: DC
  Administered 2012-04-29 – 2012-05-02 (×8): 1 mg via ORAL
  Filled 2012-04-29 (×8): qty 1

## 2012-04-29 NOTE — Progress Notes (Signed)
Subjective: Patient is frustrated as this is going to week #3 of his inpatient hospitalization. Patient wants to go home. Per nursing patient was threatening to leave this morning.  Objective: Vital signs in last 24 hours: Filed Vitals:   04/28/12 1830 04/28/12 1843 04/28/12 2135 04/29/12 0613  BP: 155/85 173/97 150/82 150/89  Pulse: 87 105 87 78  Temp:  98 F (36.7 C) 98.5 F (36.9 C) 98.3 F (36.8 C)  TempSrc:  Oral Oral Oral  Resp: 20 24 16 20   Height:      Weight:  86.9 kg (191 lb 9.3 oz) 85.6 kg (188 lb 11.4 oz)   SpO2:  97% 97% 98%    Intake/Output Summary (Last 24 hours) at 04/29/12 0944 Last data filed at 04/29/12 0819  Gross per 24 hour  Intake    120 ml  Output   -151 ml  Net    271 ml    Weight change: -5.6 kg (-12 lb 5.5 oz)  General: Alert, awake, oriented x3, in no acute distress. HEENT: No bruits, no goiter. Heart: Regular rate and rhythm, without murmurs, rubs, gallops. Lungs: Clear to auscultation bilaterally. Abdomen: Soft, nontender, nondistended, positive bowel sounds. Extremities: No clubbing cyanosis. 1+ BLE pedal edema.There is a punch biopsy site, about 0.5 cm diameter, which has an eschar, without purulent exudate, and appears to be healing. There is decreased perifocal erythema. Neuro: Grossly intact, nonfocal.    Lab Results:  Basename 04/29/12 0603 04/28/12 0625 04/27/12 0817  NA 140 139 --  K 3.3* 3.0* --  CL 99 97 --  CO2 29 24 --  GLUCOSE 124* 133* --  BUN 42* 67* --  CREATININE 6.40* 9.18* --  CALCIUM 9.1 9.8 --  MG -- -- --  PHOS 3.7 -- 5.3*    Basename 04/29/12 0603 04/27/12 0817  AST -- --  ALT -- --  ALKPHOS -- --  BILITOT -- --  PROT -- --  ALBUMIN 3.5 3.6   No results found for this basename: LIPASE:2,AMYLASE:2 in the last 72 hours  Basename 04/29/12 0603 04/28/12 0625  WBC 5.2 6.5  NEUTROABS -- --  HGB 8.5* 8.8*  HCT 24.5* 25.2*  MCV 86.3 85.4  PLT 239 188   No results found for this basename:  CKTOTAL:3,CKMB:3,CKMBINDEX:3,TROPONINI:3 in the last 72 hours No components found with this basename: POCBNP:3 No results found for this basename: DDIMER:2 in the last 72 hours No results found for this basename: HGBA1C:2 in the last 72 hours No results found for this basename: CHOL:2,HDL:2,LDLCALC:2,TRIG:2,CHOLHDL:2,LDLDIRECT:2 in the last 72 hours No results found for this basename: TSH,T4TOTAL,FREET3,T3FREE,THYROIDAB in the last 72 hours No results found for this basename: VITAMINB12:2,FOLATE:2,FERRITIN:2,TIBC:2,IRON:2,RETICCTPCT:2 in the last 72 hours  Micro Results: No results found for this or any previous visit (from the past 240 hour(s)).  Studies/Results: No results found.  Medications:     . allopurinol  100 mg Oral Daily  . amLODipine  10 mg Oral QHS  . calcium carbonate  2 tablet Oral TID WC  . darbepoetin (ARANESP) injection - DIALYSIS  40 mcg Intravenous Q Thu-HD  . desmopressin (DDAVP) IV  25 mcg Intravenous Once  . gabapentin  300 mg Oral QHS  . lanthanum  1,000 mg Oral TID WC  . memantine  10 mg Oral BID  . multivitamin  1 tablet Oral Daily  . potassium chloride  40 mEq Oral Once  . senna  1 tablet Oral QHS  . DISCONTD: desmopressin  25 mcg Intravenous Once  .  DISCONTD: furosemide  160 mg Oral BID    Assessment: Principal Problem:  *ARF (acute renal failure) Active Problems:  HTN (hypertension)  Hypoglycemia  DM (diabetes mellitus)  Hyperlipidemia  Cellulitis  Dementia  Thrombocytopenia  Constipation  Hypokalemia   Plan: #1 ARF (acute renal failure):  Patient presented with creatine of 10.79 and BUN 60, consistent with ARF. Although he is likely to have a degree of underlying CKD, given his comorbidities of long standing DM/HTN, this is difficult to prove at present, as baseline renal indices are not available. Precipitants, include ACE/diuretic and recent NSAID use, against a background ov vomiting and poor oral intake, pre-admission, likely  predisposing ting to dehydration and volume depletion. Renal ultrasound shows no evidence of hydronephrosis, although there was prostatomegaly, and work-up with myeloma screen is in progress. Dr Camille Bal kindly provided renal consultation, and Dr Leanor Kail is currently following. We are managing as recommended, with avoidance of nephrotoxins, iv fluid administration, with Bicarbonate. Unfortunately, renal function has failed to improve so far, and on continuing to trend up and  6/4 creatinine 14.63, with BUN 111. Today Cr worse than yesterday and currently at 9.18 from 8.68. -Dialysis initiated 5/30,  appreciate renal assistance  -Patient started on Lasix per renal 6/3  -Patient for renal biopsy today. Patient had HD 3 days ago, and HD yesterday .Per renal Patient threatening to leave AMA this morning. Came and spoke with patient at length concerning his current situation and that leaving would be committing suicide. Also explained to patient the need to stay until he is stable enough to go home per nephrology. Patient is to have a biopsy done today. Patient is in agreement to stay. I explained to patient that if he decides to leave AGAINST MEDICAL ADVICE will likely need to involuntary commit him. Psychiatry agrees with this per the consult note.  2. Hypoglycemic episode:  Patient presented with altered mental status/confusion, and fell. Fortunately, he only sustained a few abrasions, but no bony injuries. CBG by EMS, was 33, and patient responded to iv D50W, and has since resolved, with iv infusion of D5W. This was due to continued utilization of oral hypoglycemic therapy, in the face of declining GFR. Glipizide remains on hold, and serial CBGs Are being monitored.  BG remains stable today.   3. DM:  Known type 2 diabetic. Pre-admssion control is uncertain. Otherwise, see discussion in #2 above. HBA1C is 7.0.  - BG controlled today   4. HTN, Uncontrolled.  ACE-i and diuretic dc'ed on  admission 2/2 to #1,   Some improvement with increased dose of Norvasc to 10 mg daily ,  follow and titrate, continue prn Hydralazine for now -not aiming for meticulous control at this time, so as not to worsen renal perfusion.   5. Early dementia: Stable. Patient started on Namenda per psych rxcs. Follow.  6. thrombocytopenia: unclear etiology, platelet count beginning to trend up, no gross bleeding.   7. Left foot cellulitis: Following skin biopsy, done on 04/09/12. Commenced on iv Ciprofloxacin/Clindamycin on 04/15/12, (Doxycycline was stopped on 04/15/12, after 2 days of therapy). MRI left foot on 04/15/12, showed nonspecific extensive subcutaneous edema, but no evidence of osteomyelitis or soft tissue abscess.  -LLE significantly more edematous than R. But per Doppler ultrasound done 5/30 negative for DVT.  - Clinical improvement. Clindamycin d/c'd. C continue oral ciprofloxacin(antibiotic day 10/10]  D/c'd cipro.  8 Constipation- continue senokot/dulcolax prn  Disposition: Per Renal.  9 disposition Patient per nurse and was angry this morning  and threatening to leave AMA. Patient is in acute renal failure and likely uremic as well and likely not making a sound decision. Explained to patient that leaving AGAINST MEDICAL ADVICE will likely lead to his death out in the field due to his current renal function. Patient is frustrated about being here for 2-3 weeks and wants to go home. Explained to patient that renal doctor wanted to see how his renal function does over the weekend and will decide on Monday as to whether he biopsy is needed. Patient states that when he wants to leave we will not be able to stop him. Patient seen by psych and feel patient with poor insight into severity of illness, and constant agreement and objection speaks to his faltering capacity and retention. If patient insists on leaving AMA will need IVC. Appreciate psych input and rxcs.  #10 hypokalemia Defer to renal.    LOS: 16 days   Laelyn Blumenthal [phone] 04/29/2012, 9:44 AM

## 2012-04-29 NOTE — Progress Notes (Addendum)
Patient Identification:  Gary Mcneil Date of Evaluation:  04/29/2012 Reason for Consult: Pt is agitating to leave hemodialysis unit AMA Referring Provider: Dr Janee Morn  History of Present Illness: Pt developed acute renal failure ~ 2 wks ago,  Etiology unknown.  He has received HD and now has begun demanding to go home. Nephrologists planned renal Bx this am.  Pt became agitated to the extent he threatened to pull out his fistula [neck] insertion. The Bx was cancelled.  He continues to threaten to leave (as he did last night) and IVC was signed.  Past Psychiatric History:not known   Past Medical History:     Past Medical History  Diagnosis Date  . Diabetes mellitus   . DEMENTIA   . Neuromuscular disorder   . Hypertension   . Gout   . Arthritis   . Dementia 04/24/2012       Past Surgical History  Procedure Date  . Hernia repair     Allergies:  Allergies  Allergen Reactions  . Penicillins Rash    Current Medications:  Prior to Admission medications   Medication Sig Start Date End Date Taking? Authorizing Provider  acetaminophen-codeine (TYLENOL #3) 300-30 MG per tablet Take 1 tablet by mouth 2 (two) times daily.   Yes Historical Provider, MD  allopurinol (ZYLOPRIM) 300 MG tablet Take 300 mg by mouth 2 (two) times daily.   Yes Historical Provider, MD  gabapentin (NEURONTIN) 600 MG tablet Take 600 mg by mouth 4 (four) times daily.   Yes Historical Provider, MD  glipiZIDE (GLUCOTROL XL) 5 MG 24 hr tablet Take 5 mg by mouth daily.   Yes Historical Provider, MD  lisinopril-hydrochlorothiazide (PRINZIDE,ZESTORETIC) 20-25 MG per tablet Take 1 tablet by mouth daily.   Yes Historical Provider, MD  potassium chloride SA (K-DUR,KLOR-CON) 20 MEQ tablet Take 20 mEq by mouth daily.   Yes Historical Provider, MD  rosuvastatin (CRESTOR) 40 MG tablet Take 40 mg by mouth at bedtime.   Yes Historical Provider, MD  Vitamin D, Ergocalciferol, (DRISDOL) 50000 UNITS CAPS Take 50,000 Units by mouth  once a week.   Yes Historical Provider, MD    Social History:    reports that he has never smoked. He does not have any smokeless tobacco history on file. He reports that he drinks alcohol. He reports that he does not use illicit drugs.   Family History:    Family History  Problem Relation Age of Onset  . Diabetes Mother   . Diabetes Father   . Diabetes Sister   . Hypertension Mother   . Hypertension Father   . Kidney disease Father   . Kidney disease Brother     Mental Status Examination/Evaluation: Objective:  Appearance: Fairly Groomed  Psychomotor Activity:  Increased easily agitated  Eye Contact::  Good  Speech:  Clear and Coherent  Volume:  Normal  Mood:  Anxious and Dysphoric  Affect:  Congruent  Thought Process:  Disorganized and logical in short term; poor retention of information - long term  signs of poor memory  Orientation:  Other:  Unable to grasp enormity of his kidney problem  Thought Content:  Paranoia  Suicidal Thoughts:  Yes.  with intent/plan  Homicidal Thoughts:  No  Judgement:  Other:  Pt demonstrates he is unable to retain  information  Insight:  Lacking and causing greater risk to self by disrupting plan to evaluate cause of acure reanal faiilure.     DIAGNOSIS:   AXIS I   Early Dementia  AXIS II  Deferrd  AXIS III See medical notes.  AXIS IV other psychosocial or environmental problems, problems related to social environment, problems with primary support group and sabotaging diagnosis to improve pt care  AXIS V 61-70 mild symptoms   Assessment/Plan:  Discuss with Dr. Allena Katz, Pt's son, Angelina Pih Pt is evaluated ~ 1:30pm 04/29/12 Pt is interviewed with Dr. Allena Katz, son, GF present  He perseverates about leaving the hospital and insists he will leave today.  Dr. Allena Katz explains why the Bx was cancelled due to pt's aggravation about the fistula in his neck and threatens to pull the fistula out.  The goal to determine IF there is a treatment for his  acute renal failure appears to escape pt's comprehension.  He is yelling he just wants to go home and should he die that is 'all right'  Not recognizing his acute renal disease might be cured.   The son is asked to discuss his knowledge of his father.  He steps out of the room and explains he has taken his father to the PCP to describe observed demential.  He agrees his father's behavior is failing to understand  Dr. Eliane Decree explanation.  He also has consistently agreed in a very rational manner saying he understands the plan. Within a few hours he retracts his agreement demanding to go home.   RECOMMENDATION:  1. Pt does not demonstrate he has capacity to understand efforts to sustain his life have been scheduled and he consistently recants after he says he agrees.  He is so focused i.e.perseveration on going home it has become an obsession. Consistent with symptoms of dementia.  2. Consider continue  IVC for pt trying to leave AMA 3. Dr. Allena Katz requests son to sign on pt's behalf to discern status of kidney function.  4. Discussed with Dr. Allena Katz a scheduled benzodiazepine to minimize anxiety and agitation regarding treatment.  5. Suggest Son seek durable power of attorney to make decisions for father' best interest (NB pt had two new autos repossessed because he 'forgot' to write checks for payments) 6. Will follow pt.  Ascencion Coye J. Ferol Luz, MD Psychiatrist 04/29/2012 11:04 PM

## 2012-04-29 NOTE — H&P (Signed)
Chief Complaint: Renal Failure Referring Physician:Patel HPI: Gary Mcneil is an 71 y.o. male who was admitted with ARF and a history of DM, HTN, and mild dementia. He has been here a couple weeks. He  Required hemodialysis but has not had in several days. Request is now made for random renal biopsy to aid in diagnosis.  Past Medical History:  Past Medical History  Diagnosis Date  . Diabetes mellitus   . DEMENTIA   . Neuromuscular disorder   . Hypertension   . Gout   . Arthritis   . Dementia 04/24/2012    Past Surgical History:  Past Surgical History  Procedure Date  . Hernia repair     Family History:  Family History  Problem Relation Age of Onset  . Diabetes Mother   . Diabetes Father   . Diabetes Sister   . Hypertension Mother   . Hypertension Father   . Kidney disease Father   . Kidney disease Brother     Social History:  reports that he has never smoked. He does not have any smokeless tobacco history on file. He reports that he drinks alcohol. He reports that he does not use illicit drugs.  Allergies:  Allergies  Allergen Reactions  . Penicillins Rash    Medications: Medications Prior to Admission  Medication Sig Dispense Refill  . acetaminophen-codeine (TYLENOL #3) 300-30 MG per tablet Take 1 tablet by mouth 2 (two) times daily.      [name] allopurinol (ZYLOPRIM) 300 MG tablet Take 300 mg by mouth 2 (two) times daily.      [name] gabapentin (NEURONTIN) 600 MG tablet Take 600 mg by mouth 4 (four) times daily.      [name] glipiZIDE (GLUCOTROL XL) 5 MG 24 hr tablet Take 5 mg by mouth daily.      [name] lisinopril-hydrochlorothiazide (PRINZIDE,ZESTORETIC) 20-25 MG per tablet Take 1 tablet by mouth daily.      . potassium chloride SA (K-DUR,KLOR-CON) 20 MEQ tablet Take 20 mEq by mouth daily.      . rosuvastatin (CRESTOR) 40 MG tablet Take 40 mg by mouth at bedtime.      . Vitamin D, Ergocalciferol, (DRISDOL) 50000 UNITS CAPS Take 50,000 Units by mouth once a week.        Please  HPI for pertinent positives, otherwise complete 10 system ROS negative.  Physical Exam: Blood pressure 135/85, pulse 93, temperature 98.4 F (36.9 C), temperature source Oral, resp. rate 18, height 6\' 2"  (1.88 m), weight 188 lb 11.4 oz (85.6 kg), SpO2 98.00%. Body mass index is 24.23 kg/(m^2).   General Appearance:  Alert, no distress, but very upset and frustrated about length of stay and that HD cath remains in neck even though he isn's getting HD  Head:  Normocephalic, without obvious abnormality, atraumatic  ENT: Unremarkable  Neck: Supple, symmetrical, trachea midline. (R)HD cath intact.  Lungs:   Clear to auscultation bilaterally, no w/r/r, respirations unlabored without use of accessory muscles.  Chest Wall:  No tenderness or deformity  Heart:  Regular rate and rhythm, S1, S2 normal, no murmur, rub or gallop. Carotids 2+ without bruit.  Abdomen:   Soft, non-tender, non distended. Bowel sounds active all four quadrants,  no masses, no organomegaly.  Extremities: Extremities normal, atraumatic, no cyanosis or edema  Neurologic: Normal affect, no gross deficits.   Results for orders placed during the hospital encounter of 04/13/12 (from the past 48 hour(s))  GLUCOSE, CAPILLARY     Status: Abnormal   Collection  Time   04/27/12  4:34 PM      Component Value Range Comment   Glucose-Capillary 101 (*) 70 - 99 (mg/dL)    Comment 1 Documented in Chart      Comment 2 Notify RN     GLUCOSE, CAPILLARY     Status: Abnormal   Collection Time   04/27/12  9:46 PM      Component Value Range Comment   Glucose-Capillary 154 (*) 70 - 99 (mg/dL)   BASIC METABOLIC PANEL     Status: Abnormal   Collection Time   04/28/12  6:25 AM      Component Value Range Comment   Sodium 139  135 - 145 (mEq/L)    Potassium 3.0 (*) 3.5 - 5.1 (mEq/L)    Chloride 97  96 - 112 (mEq/L)    CO2 24  19 - 32 (mEq/L)    Glucose, Bld 133 (*) 70 - 99 (mg/dL)    BUN 67 (*) 6 - 23 (mg/dL)    Creatinine, Ser 6.29 (*) 0.50 - 1.35  (mg/dL)    Calcium 9.8  8.4 - 10.5 (mg/dL)    GFR calc non Af Amer 5 (*) >90 (mL/min)    GFR calc Af Amer 6 (*) >90 (mL/min)   CBC     Status: Abnormal   Collection Time   04/28/12  6:25 AM      Component Value Range Comment   WBC 6.5  4.0 - 10.5 (K/uL)    RBC 2.95 (*) 4.22 - 5.81 (MIL/uL)    Hemoglobin 8.8 (*) 13.0 - 17.0 (g/dL)    HCT 52.8 (*) 41.3 - 52.0 (%)    MCV 85.4  78.0 - 100.0 (fL)    MCH 29.8  26.0 - 34.0 (pg)    MCHC 34.9  30.0 - 36.0 (g/dL)    RDW 24.4  01.0 - 27.2 (%)    Platelets 188  150 - 400 (K/uL)   GLUCOSE, CAPILLARY     Status: Abnormal   Collection Time   04/28/12  7:42 AM      Component Value Range Comment   Glucose-Capillary 131 (*) 70 - 99 (mg/dL)    Comment 1 Notify RN     GLUCOSE, CAPILLARY     Status: Abnormal   Collection Time   04/28/12 11:35 AM      Component Value Range Comment   Glucose-Capillary 151 (*) 70 - 99 (mg/dL)    Comment 1 Notify RN     GLUCOSE, CAPILLARY     Status: Abnormal   Collection Time   04/28/12  7:25 PM      Component Value Range Comment   Glucose-Capillary 118 (*) 70 - 99 (mg/dL)   GLUCOSE, CAPILLARY     Status: Abnormal   Collection Time   04/28/12  9:42 PM      Component Value Range Comment   Glucose-Capillary 174 (*) 70 - 99 (mg/dL)   CBC     Status: Abnormal   Collection Time   04/29/12  6:03 AM      Component Value Range Comment   WBC 5.2  4.0 - 10.5 (K/uL)    RBC 2.84 (*) 4.22 - 5.81 (MIL/uL)    Hemoglobin 8.5 (*) 13.0 - 17.0 (g/dL)    HCT 53.6 (*) 64.4 - 52.0 (%)    MCV 86.3  78.0 - 100.0 (fL)    MCH 29.9  26.0 - 34.0 (pg)    MCHC 34.7  30.0 - 36.0 (g/dL)  RDW 14.2  11.5 - 15.5 (%)    Platelets 239  150 - 400 (K/uL)   RENAL FUNCTION PANEL     Status: Abnormal   Collection Time   04/29/12  6:03 AM      Component Value Range Comment   Sodium 140  135 - 145 (mEq/L)    Potassium 3.3 (*) 3.5 - 5.1 (mEq/L)    Chloride 99  96 - 112 (mEq/L)    CO2 29  19 - 32 (mEq/L)    Glucose, Bld 124 (*) 70 - 99 (mg/dL)    BUN  42 (*) 6 - 23 (mg/dL)    Creatinine, Ser 9.60 (*) 0.50 - 1.35 (mg/dL)    Calcium 9.1  8.4 - 10.5 (mg/dL)    Phosphorus 3.7  2.3 - 4.6 (mg/dL)    Albumin 3.5  3.5 - 5.2 (g/dL)    GFR calc non Af Amer 8 (*) >90 (mL/min)    GFR calc Af Amer 9 (*) >90 (mL/min)   GLUCOSE, CAPILLARY     Status: Abnormal   Collection Time   04/29/12  7:40 AM      Component Value Range Comment   Glucose-Capillary 129 (*) 70 - 99 (mg/dL)   GLUCOSE, CAPILLARY     Status: Abnormal   Collection Time   04/29/12  1:00 PM      Component Value Range Comment   Glucose-Capillary 115 (*) 70 - 99 (mg/dL)    No results found.  Assessment/Plan Acute renal failure. Discussed US guided renal biopsy procedure to pt. Dr. Allena Katz has explained procedure to son and son has signed consent due to patient's dementia, determined not capable of making medical decisions. Procedure to be done tomorrow as pt has eaten today and renal biopsies preferentially done in am. Will be NPO after MN except meds.  Brayton El PA-C 04/29/2012, 3:09 PM

## 2012-04-29 NOTE — Progress Notes (Signed)
Patient ID: Gary Mcneil, male   DOB: 09-28-1941, 71 y.o.   MRN: [id number]   [facility] Progress Note    Subjective:   Denies any emerging complaints at this time   Objective:   BP 150/89  Pulse 78  Temp(Src) 98.3 F (36.8 C) (Oral)  Resp 20  Ht 6\' 2"  (1.88 m)  Wt 85.6 kg (188 lb 11.4 oz)  BMI 24.23 kg/m2  SpO2 98%  Intake/Output Summary (Last 24 hours) at 04/29/12 0959 Last data filed at 04/29/12 0819  Gross per 24 hour  Intake    120 ml  Output   -151 ml  Net    271 ml   Weight change: -5.6 kg (-12 lb 5.5 oz)  Physical Exam: WUJ:[id number] sitting in recliner drinking soda AOZ:HYQMV RRR, normal s1 and S2  Resp:CTA bilaterally, no rales/wheeze HQI:ONGE, flat, NT, BS normal Ext:No LE edema  Imaging: No results found.  Labs: BMET  Lab 04/29/12 0603 04/28/12 9528 04/27/12 0817 04/26/12 1120 04/25/12 1345 04/25/12 0605 04/24/12 0730 04/24/12 0555  NA 140 139 138 140 138 138 134* --  K 3.3* 3.0* 3.5 3.3* 3.2* 3.1* 3.2* --  CL 99 97 97 100 96 97 95* --  CO2 29 24 25 25 24 23 19  --  GLUCOSE 124* 133* 132* 126* 102* 109* 107* --  BUN 42* 67* 61* 51* 80* 79* 111* --  CREATININE 6.40* 9.18* 8.68* 7.56* 11.06* 10.91* 14.26* --  ALB -- -- -- -- -- -- -- --  CALCIUM 9.1 9.8 9.8 9.5 9.5 9.1 8.8 --  PHOS 3.7 -- 5.3* 4.1 6.3* 6.3* 8.8* 9.0*   CBC  Lab 04/29/12 0603 04/28/12 0625 04/25/12 1345 04/24/12 0729  WBC 5.2 6.5 7.6 6.7  NEUTROABS -- -- 5.9 --  HGB 8.5* 8.8* 9.0* 8.4*  HCT 24.5* 25.2* 24.5* 23.1*  MCV 86.3 85.4 84.5 83.4  PLT 239 188 102* 91*    Medications:      . allopurinol  100 mg Oral Daily  . amLODipine  10 mg Oral QHS  . calcium carbonate  2 tablet Oral TID WC  . darbepoetin (ARANESP) injection - DIALYSIS  40 mcg Intravenous Q Thu-HD  . desmopressin (DDAVP) IV  25 mcg Intravenous Once  . gabapentin  300 mg Oral QHS  . lanthanum  1,000 mg Oral TID WC  . memantine  10 mg Oral BID  . multivitamin  1 tablet Oral Daily  .  potassium chloride  40 mEq Oral Once  . senna  1 tablet Oral QHS  . DISCONTD: desmopressin  25 mcg Intravenous Once  . DISCONTD: furosemide  160 mg Oral BID     Assessment/ Plan:   1. AKI of unclear etiology. Serologic WU neg. UA + protein. Renal fx nl one month ago. UOP has increased, but is not being recorded. Plan for renal biopsy today for diagnostic and prognostic purposes. The patient remains a little uncooperative and disoriented at times limiting care. Will assess closely for further HD needs. 2. Anemia: on ESA, no overt blood loss. 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  Zetta Bills, MD 04/29/2012, 9:59 AM

## 2012-04-30 ENCOUNTER — Inpatient Hospital Stay (HOSPITAL_COMMUNITY): Payer: Medicare Other

## 2012-04-30 DIAGNOSIS — L03119 Cellulitis of unspecified part of limb: Secondary | ICD-10-CM

## 2012-04-30 DIAGNOSIS — I10 Essential (primary) hypertension: Secondary | ICD-10-CM

## 2012-04-30 DIAGNOSIS — L02419 Cutaneous abscess of limb, unspecified: Secondary | ICD-10-CM

## 2012-04-30 DIAGNOSIS — N179 Acute kidney failure, unspecified: Secondary | ICD-10-CM

## 2012-04-30 DIAGNOSIS — E1165 Type 2 diabetes mellitus with hyperglycemia: Secondary | ICD-10-CM

## 2012-04-30 LAB — RENAL FUNCTION PANEL
CO2: 27 mEq/L (ref 19–32)
Calcium: 9.2 mg/dL (ref 8.4–10.5)
Creatinine, Ser: 6.89 mg/dL — ABNORMAL HIGH (ref 0.50–1.35)
Glucose, Bld: 123 mg/dL — ABNORMAL HIGH (ref 70–99)
Phosphorus: 4.6 mg/dL (ref 2.3–4.6)
Sodium: 140 mEq/L (ref 135–145)

## 2012-04-30 LAB — GLUCOSE, CAPILLARY
Glucose-Capillary: 90 mg/dL (ref 70–99)
Glucose-Capillary: 221 mg/dL — ABNORMAL HIGH (ref 70–99)

## 2012-04-30 LAB — PROTIME-INR: Prothrombin Time: 15.7 seconds — ABNORMAL HIGH (ref 11.6–15.2)

## 2012-04-30 LAB — APTT: aPTT: 37 seconds (ref 24–37)

## 2012-04-30 MED ORDER — MIDAZOLAM HCL 2 MG/2ML IJ SOLN
INTRAMUSCULAR | Status: AC
  Filled 2012-04-30: qty 4

## 2012-04-30 MED ORDER — FENTANYL CITRATE 0.05 MG/ML IJ SOLN
INTRAMUSCULAR | Status: AC
  Filled 2012-04-30: qty 4

## 2012-04-30 MED ORDER — HALOPERIDOL LACTATE 5 MG/ML IJ SOLN
5.0000 mg | Freq: Once | INTRAMUSCULAR | Status: AC
  Administered 2012-04-30: 5 mg via INTRAMUSCULAR
  Filled 2012-04-30: qty 1

## 2012-04-30 MED ORDER — FENTANYL CITRATE 0.05 MG/ML IJ SOLN
INTRAMUSCULAR | Status: AC | PRN
  Administered 2012-04-30: 25 ug via INTRAVENOUS

## 2012-04-30 MED ORDER — MIDAZOLAM HCL 5 MG/5ML IJ SOLN
INTRAMUSCULAR | Status: AC | PRN
  Administered 2012-04-30: 1 mg via INTRAVENOUS

## 2012-04-30 NOTE — Progress Notes (Signed)
Interim Summary [name] is a 71 year old African American gentleman with history of hypertension diabetes mild dementia gout rule was admitted on 04/13/2012 secondary to acute renal failure and a background of long-standing diabetes hypertension ACE/diuretic and NSAID use at that time with a creatinine of 10.79. ACE inhibitor was discontinued patient was monitored initially placed on IV fluids and and followed. Serologic workup which was done was negative. Urinalysis which was done did have some proteinuria. Per nephrology the patient did have a normal renal function approximately a month prior to admission. A renal consultation was obtained and patient was seen in consultation by Dr. Eliott Nine of nephrology. Patient's renal function worsened and his creatinine went up as high as 18.31. A hemodialysis catheter was subsequently placed and patient has been receiving intermittent hemodialysis. Patient is also been placed on Lasix and has had increasing urine output. His renal function has slowly decreased however not improved significantly. Patient during this hospitalization has had bouts of agitation and threatening to leave AMA. Patient has also been uncooperative and disoriented at times which has limited his care. A psychiatric consultation was obtained patient was seen by Dr. Ferol Luz was felt that patient lacked capacity and it was recommended that the patient decided to leave AMA and IVC be placed. Patient was also started on Namenda for his dementia. IVC has been placed. And patient is subsequently awaiting renal biopsy today. Patient was supposed to have a renal biopsy yesterday however patient was very agitated and a such it was postponed until today. Further recommendations will be per renal. Patient has also been treated for cellulitis during this hospitalization has had a hypoglycemic episode which has since resolved. His blood pressure medications have been titrated for better blood pressure  control.    Subjective: Patient is currently calm today, and cooperative today and asking whether he could be taken down for his biopsy.  Objective: Vital signs in last 24 hours: Filed Vitals:   04/29/12 1303 04/29/12 1714 04/29/12 2210 04/30/12 0514  BP: 135/85 134/89 116/69 126/70  Pulse: 93 97 81 88  Temp: 98.4 F (36.9 C) 98.5 F (36.9 C) 98.1 F (36.7 C) 98 F (36.7 C)  TempSrc: Oral Oral Oral Oral  Resp: 18 18 18 18   Height:      Weight:    86.546 kg (190 lb 12.8 oz)  SpO2: 98% 94% 94% 94%    Intake/Output Summary (Last 24 hours) at 04/30/12 0857 Last data filed at 04/30/12 0507  Gross per 24 hour  Intake    600 ml  Output      0 ml  Net    600 ml    Weight change: 4.246 kg (9 lb 5.8 oz)  General: Alert, awake, oriented x3, in no acute distress. HEENT: No bruits, no goiter. Heart: Regular rate and rhythm, without murmurs, rubs, gallops. Lungs: Clear to auscultation bilaterally. Abdomen: Soft, nontender, nondistended, positive bowel sounds. Extremities: No clubbing cyanosis. Trace-1 + BLE pedal edema.There is a punch biopsy site, about 0.5 cm diameter, which has an eschar, without purulent exudate, and appears to be healing. There is decreased perifocal erythema. Neuro: Grossly intact, nonfocal.    Lab Results:  Basename 04/30/12 0500 04/29/12 0603  NA 140 140  K 3.3* 3.3*  CL 100 99  CO2 27 29  GLUCOSE 123* 124*  BUN 53* 42*  CREATININE 6.89* 6.40*  CALCIUM 9.2 9.1  MG -- --  PHOS 4.6 3.7    Basename 04/30/12 0500 04/29/12 0603  AST -- --  ALT -- --  ALKPHOS -- --  BILITOT -- --  PROT -- --  ALBUMIN 3.6 3.5   No results found for this basename: LIPASE:2,AMYLASE:2 in the last 72 hours  Basename 04/29/12 0603 04/28/12 0625  WBC 5.2 6.5  NEUTROABS -- --  HGB 8.5* 8.8*  HCT 24.5* 25.2*  MCV 86.3 85.4  PLT 239 188   No results found for this basename: CKTOTAL:3,CKMB:3,CKMBINDEX:3,TROPONINI:3 in the last 72 hours No components found with  this basename: POCBNP:3 No results found for this basename: DDIMER:2 in the last 72 hours No results found for this basename: HGBA1C:2 in the last 72 hours No results found for this basename: CHOL:2,HDL:2,LDLCALC:2,TRIG:2,CHOLHDL:2,LDLDIRECT:2 in the last 72 hours No results found for this basename: TSH,T4TOTAL,FREET3,T3FREE,THYROIDAB in the last 72 hours No results found for this basename: VITAMINB12:2,FOLATE:2,FERRITIN:2,TIBC:2,IRON:2,RETICCTPCT:2 in the last 72 hours  Micro Results: No results found for this or any previous visit (from the past 240 hour(s)).  Studies/Results: No results found.  Medications:     . allopurinol  100 mg Oral Daily  . amLODipine  10 mg Oral QHS  . calcium carbonate  2 tablet Oral TID WC  . darbepoetin (ARANESP) injection - DIALYSIS  40 mcg Intravenous Q Thu-HD  . desmopressin (DDAVP) IV  25 mcg Intravenous Once  . gabapentin  300 mg Oral QHS  . lanthanum  1,000 mg Oral TID WC  . LORazepam  1 mg Oral TID  . memantine  10 mg Oral BID  . multivitamin  1 tablet Oral Daily  . senna  1 tablet Oral QHS    Assessment: Principal Problem:  *ARF (acute renal failure) Active Problems:  HTN (hypertension)  Hypoglycemia  DM (diabetes mellitus)  Hyperlipidemia  Cellulitis  Dementia  Thrombocytopenia  Constipation  Hypokalemia   Plan: #1 ARF (acute renal failure):  Patient presented with creatine of 10.79 and BUN 60, consistent with ARF. Although he is likely to have a degree of underlying CKD, given his comorbidities of long standing DM/HTN, this is difficult to prove at present, as baseline renal indices are not available. Precipitants, include ACE/diuretic and recent NSAID use, against a background ov vomiting and poor oral intake, pre-admission, likely predisposing ting to dehydration and volume depletion. Renal ultrasound shows no evidence of hydronephrosis, although there was prostatomegaly, and work-up with myeloma screen is in progress. Dr Camille Bal kindly provided renal consultation, and Dr Leanor Kail is currently following. We are managing as recommended, with avoidance of nephrotoxins, iv fluid administration, with Bicarbonate. Unfortunately, renal function has failed to improve so far, and on continuing to trend up and  6/4 creatinine 14.63, with BUN 111. Today Cr a 6.89. Patient did have hemodialysis 2 days ago.  -Dialysis initiated 5/30,  appreciate renal assistance  -Patient started on Lasix per renal 6/3  -Patient for renal biopsy today as decreased agitation yesterday and had to be canceled. Patient had HD 2 days ago .Per renal Patient threatening to leave AMA yesterday morning. Came and spoke with patient at length concerning his current situation and that leaving would be committing suicide. Also explained to patient the need to stay until he is stable enough to go home per nephrology. Patient is to have a biopsy done today. Patient is in agreement to stay. I explained to patient that if he decides to leave AGAINST MEDICAL ADVICE will likely need to involuntary commit him. Psychiatry agrees with this per the consult note. Patient has been placed under involuntary commitment at this time which was started  on Saturday, 04/27/2012 for 7 days. Patient to undergo renal biopsy today.  2. Hypoglycemic episode:  Patient presented with altered mental status/confusion, and fell. Fortunately, he only sustained a few abrasions, but no bony injuries. CBG by EMS, was 33, and patient responded to iv D50W, and has since resolved, with iv infusion of D5W. This was due to continued utilization of oral hypoglycemic therapy, in the face of declining GFR. Glipizide remains on hold, and serial CBGs Are being monitored.  BG remains stable today.   3. DM:  Known type 2 diabetic. Pre-admssion control is uncertain. Otherwise, see discussion in #2 above. HBA1C is 7.0.  - BG controlled today   4. HTN, Uncontrolled.  ACE-i and diuretic dc'ed on  admission 2/2 to #1,   Some improvement with increased dose of Norvasc to 10 mg daily ,  follow and titrate, continue prn Hydralazine for now -not aiming for meticulous control at this time, so as not to worsen renal perfusion.   5. Early dementia: Stable. Patient started on Namenda per psych rxcs. Follow. On Ativan when necessary for agitation.  6. thrombocytopenia: unclear etiology, platelet count continuing to trend up, no gross bleeding. Today his platelets are 239.  7. Left foot cellulitis: Following skin biopsy, done on 04/09/12. Commenced on iv Ciprofloxacin/Clindamycin on 04/15/12, (Doxycycline was stopped on 04/15/12, after 2 days of therapy). MRI left foot on 04/15/12, showed nonspecific extensive subcutaneous edema, but no evidence of osteomyelitis or soft tissue abscess.  -LLE significantly more edematous than R. But per Doppler ultrasound done 5/30 negative for DVT.  - Clinical improvement. Clindamycin d/c'd. D/c'd cipro. Patient is status post 10 days of antibiotic therapy.  8 Constipation- continue senokot/dulcolax prn  Disposition: Per Renal.  9 disposition Patient per nurse and was angry yesterday morning and threatening to leave AMA. Patient is in acute renal failure and likely uremic as well and likely not making a sound decision. Explained to patient that leaving AGAINST MEDICAL ADVICE will likely lead to his death out in the field due to his current renal function. Patient is frustrated about being here for 2-3 weeks and wants to go home. Explained to patient that renal doctor wanted to see how his renal function does over the weekend and will decide on Monday as to whether he biopsy is needed. Biopsy was canceled yesterday due to agitation and rescheduled for today. Yesterday Patient stated that when he wants to leave we will not be able to stop him. Patient seen by psych and feel patient with poor insight into severity of illness, and constant agreement and objection speaks to his  faltering capacity and retention. If patient insists on leaving AMA will need IVC. Patient has been placed on involuntary commitment as of Saturday, 04/27/2012 for 7 days. Psych is following. Appreciate psych input and rxcs.  #10 hypokalemia Defer to renal.   LOS: 17 days   Emaline Karnes [phone] 04/30/2012, 8:57 AM

## 2012-04-30 NOTE — Procedures (Signed)
[place] [facility] KIDNEY 16 G CORE BX X 2 NO COMP STABLE

## 2012-04-30 NOTE — Progress Notes (Signed)
Gary Bi E.   Pt with acute renal failure has received hemodialysis and condition requires renal Bx that was performed today.   This pt has had moments of agitation demonstrating his inability to retain and comprehend the seriousness of his acute renal failure which Dr. Allena Katz explained that the Bx was necessary because the condition may be treated to avoid long term hemodialysis.  While seeming to understand each encounter with the medical team and psychiatrist, a few hours later he would be adamant that he was going home, even up to the time of the scheduled renal Bx yesterday.  It was cancelled and a family discussion followed.  The son present corroborated the concern that his father [pt] had developed dementia and had taken him to his PCP for confirmation.  The son agreed to represent his father's best interest and signed the consent for the Bx when it became very apparent that his father [pt] does not have capacity to comprehend the significance and importance to determine the etiology of his acute renal failure.   This am he was taken for the Bx and again he became very agitated.  Security guards were requested.  It happened that one of the guards was his friend.  He spoke with Gary Mcneil and was able to calm him down for the procedure.  Gary Mcneil returned to his room after the Bx.  He was sedated and it was necessary for him to remain on his abdomen for 2 hours.  He was unable to talk and a sitter was at his bedside.  His GF, Gary Mcneil, followed to his room and she spoke briefly about the agitation this pt had before the biopsy and how he was calmed.  PLAN: Will follow pt in am.

## 2012-04-30 NOTE — Progress Notes (Signed)
Patient ID: Gary Mcneil, male   DOB: [birth year], 71 y.o.   MRN: [id number]  [facility] Progress Note    Subjective:   Remains insistent on going home and states that he has been here too long. After a long discussion with him, he understands and is willing to undergo renal biopsy today. Deemed incompetent of making his own decisions or leaving AGAINST MEDICAL ADVICE    Objective:   BP 139/81  Pulse 81  Temp(Src) 98 F (36.7 C) (Oral)  Resp 18  Ht 6\' 2"  (1.88 m)  Wt 86.546 kg (190 lb 12.8 oz)  BMI 24.50 kg/m2  SpO2 100%  Intake/Output Summary (Last 24 hours) at 04/30/12 1013 Last data filed at 04/30/12 0900  Gross per 24 hour  Intake    600 ml  Output      0 ml  Net    600 ml   Weight change: 4.246 kg (9 lb 5.8 oz)  Physical Exam: Gen: comfortably sitting up in a recliner, sitter by bedside WUJ:[id number] regular rate rhythm, heart sounds S1 and S2 are normal  Resp:Clear to auscultation bilaterally, no rales/rhonchi  YNW:GNFA, flat, nontender and bowel sounds are normal  Ext:No lower extremity edema   Imaging: No results found.  Labs: BMET  Lab 04/30/12 0500 04/29/12 0603 04/28/12 0625 04/27/12 0817 04/26/12 1120 04/25/12 1345 04/25/12 0605 04/24/12 0730  NA 140 140 139 138 140 138 138 --  K 3.3* 3.3* 3.0* 3.5 3.3* 3.2* 3.1* --  CL 100 99 97 97 100 96 97 --  CO2 27 29 24 25 25 24 23  --  GLUCOSE 123* 124* 133* 132* 126* 102* 109* --  BUN 53* 42* 67* 61* 51* 80* 79* --  CREATININE 6.89* 6.40* 9.18* 8.68* 7.56* 11.06* 10.91* --  ALB -- -- -- -- -- -- -- --  CALCIUM 9.2 9.1 9.8 9.8 9.5 9.5 9.1 --  PHOS 4.6 3.7 -- 5.3* 4.1 6.3* 6.3* 8.8*   CBC  Lab 04/29/12 0603 04/28/12 0625 04/25/12 1345 04/24/12 0729  WBC 5.2 6.5 7.6 6.7  NEUTROABS -- -- 5.9 --  HGB 8.5* 8.8* 9.0* 8.4*  HCT 24.5* 25.2* 24.5* 23.1*  MCV 86.3 85.4 84.5 83.4  PLT 239 188 102* 91*    Medications:      . allopurinol  100 mg Oral Daily  . amLODipine  10 mg Oral QHS  . calcium  carbonate  2 tablet Oral TID WC  . darbepoetin (ARANESP) injection - DIALYSIS  40 mcg Intravenous Q Thu-HD  . desmopressin (DDAVP) IV  25 mcg Intravenous Once  . gabapentin  300 mg Oral QHS  . lanthanum  1,000 mg Oral TID WC  . LORazepam  1 mg Oral TID  . memantine  10 mg Oral BID  . multivitamin  1 tablet Oral Daily  . senna  1 tablet Oral QHS     Assessment/ Plan:   1. AKI of unclear etiology. Serologic WU neg. UA + protein-we'll add a antineutrophil cytoplasmic antibody screen today suspected vasculitis given negative workup so far. Renal fx nl one month ago. UOP has increased, but is not being recorded. Plan for renal biopsy today for diagnostic and prognostic purposes. The patient remains a little uncooperative and disoriented at times limiting care. No acute hemodialysis needs today and will anticipate treatment tomorrow.  2. Anemia: on ESA, no overt blood loss.  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 4. Hypokalemia: We'll supplement this via by oral route. No obvious GI  losses noted/reported.  5. Dementia/agitation and limited decision-making capacity: Appreciate input from psychiatry and hospitalist service. Currently, the patient is more placid on scheduled doses of lorazepam.  Zetta Bills, MD 04/30/2012, 10:13 AM

## 2012-05-01 ENCOUNTER — Inpatient Hospital Stay (HOSPITAL_COMMUNITY): Payer: Medicare Other

## 2012-05-01 DIAGNOSIS — I10 Essential (primary) hypertension: Secondary | ICD-10-CM

## 2012-05-01 DIAGNOSIS — L02419 Cutaneous abscess of limb, unspecified: Secondary | ICD-10-CM

## 2012-05-01 DIAGNOSIS — E1165 Type 2 diabetes mellitus with hyperglycemia: Secondary | ICD-10-CM

## 2012-05-01 DIAGNOSIS — IMO0001 Reserved for inherently not codable concepts without codable children: Secondary | ICD-10-CM

## 2012-05-01 DIAGNOSIS — L03119 Cellulitis of unspecified part of limb: Secondary | ICD-10-CM

## 2012-05-01 DIAGNOSIS — N179 Acute kidney failure, unspecified: Secondary | ICD-10-CM

## 2012-05-01 LAB — RENAL FUNCTION PANEL
Albumin: 3.8 g/dL (ref 3.5–5.2)
CO2: 26 mEq/L (ref 19–32)
Calcium: 9.4 mg/dL (ref 8.4–10.5)
Creatinine, Ser: 7.11 mg/dL — ABNORMAL HIGH (ref 0.50–1.35)
GFR calc non Af Amer: 7 mL/min — ABNORMAL LOW (ref >90)

## 2012-05-01 LAB — CBC
Hemoglobin: 7.8 g/dL — ABNORMAL LOW (ref 13.0–17.0)
MCH: 30.2 pg (ref 26.0–34.0)
MCHC: 35.1 g/dL (ref 30.0–36.0)

## 2012-05-01 LAB — GLUCOSE, CAPILLARY: Glucose-Capillary: 120 mg/dL — ABNORMAL HIGH (ref 70–99)

## 2012-05-01 MED ORDER — BOOST / RESOURCE BREEZE PO LIQD
1.0000 | ORAL | Status: DC
  Administered 2012-05-01 – 2012-05-04 (×4): 1 via ORAL

## 2012-05-01 MED ORDER — DARBEPOETIN ALFA-POLYSORBATE 100 MCG/0.5ML IJ SOLN
INTRAMUSCULAR | Status: AC
  Administered 2012-05-01: 100 ug via INTRAVENOUS
  Filled 2012-05-01: qty 0.5

## 2012-05-01 MED ORDER — DARBEPOETIN ALFA-POLYSORBATE 100 MCG/0.5ML IJ SOLN
100.0000 ug | INTRAMUSCULAR | Status: DC
  Administered 2012-05-01: 100 ug via INTRAVENOUS
  Filled 2012-05-01: qty 0.5

## 2012-05-01 MED ORDER — POTASSIUM CHLORIDE CRYS ER 20 MEQ PO TBCR
20.0000 meq | EXTENDED_RELEASE_TABLET | Freq: Once | ORAL | Status: AC
  Administered 2012-05-01: 20 meq via ORAL
  Filled 2012-05-01: qty 1

## 2012-05-01 NOTE — Progress Notes (Signed)
Patient ID: Gary Mcneil, male   DOB: 05/04/1941, 71 y.o.   MRN: [id number]   [facility] Progress Note    Subjective:   Inquires about DC home and still somewhat puzzled that he is still in the hospital- calmed down after explanation of why he needs to stay.   Objective:   BP 156/74  Pulse 80  Temp 99 F (37.2 C) (Oral)  Resp 18  Ht 6\' 2"  (1.88 m)  Wt 80.4 kg (177 lb 4 oz)  BMI 22.76 kg/m2  SpO2 99%  Intake/Output Summary (Last 24 hours) at 05/01/12 1058 Last data filed at 04/30/12 1809  Gross per 24 hour  Intake    240 ml  Output    200 ml  Net     40 ml   Weight change:   Physical Exam: MWU:[id number] on HD (Right temp IJ cath) ZDG:UYQIH RRR, normal S1 and S2  Resp:CTA bilaterally, no rales/rhonchi KVQ:QVZD, NT, BS normal Ext:No LE edema  Imaging: Ir US Guide Bx Asp/drain  04/30/2012  *RADIOLOGY REPORT*  Clinical Data: Diabetes, hypertension, acute renal failure requiring dialysis  ULTRASOUND LEFT KIDNEY LOWER POLE CORTEX CORE BIOPSY  Date:  04/30/2012 11:30:00  Radiologist:  Judie Petit. Ruel Favors, M.D.  Medications:  1 mg Versed, 25 mcg Fentanyl  Guidance:  Ultrasound  Fluoroscopy time:  None.  Sedation time:  15 minutes  Contrast volume:  None.  Complications:  No immediate  PROCEDURE/FINDINGS:  Informed consent was obtained from the patient following explanation of the procedure, risks, benefits and alternatives. The patient understands, agrees and consents for the procedure. All questions were addressed.  A time out was performed.  Maximal barrier sterile technique utilized including caps, mask, sterile gowns, sterile gloves, large sterile drape, hand hygiene, and betadine  The patient was positioned prone.  Imaging performed of both kidneys.  No hydronephrosis demonstrated on either side.  Left kidney lower pole cortex was localized.  Under sterile conditions and local anesthesia, a 16 gauge core biopsy was advanced under direct ultrasound guidance to lower  pole cortex.  2 16 gauge core biopsies obtained placed in saline.  These biopsies were intact non fragmented.  Pathology review demonstrated adequacy.  Needle removed.  Post procedure imaging demonstrates no evidence of hemorrhage or hematoma.  The patient tolerated the biopsy well.  IMPRESSION: Successful ultrasound left kidney lower pole cortex 16 gauge core biopsy  Original Report Authenticated By: Judie Petit. Ruel Favors, M.D.    Labs: BMET  Lab 05/01/12 6387 04/30/12 0500 04/29/12 0603 04/28/12 5643 04/27/12 0817 04/26/12 1120 04/25/12 1345 04/25/12 0605  NA 140 140 140 139 138 140 138 --  K 2.9* 3.3* 3.3* 3.0* 3.5 3.3* 3.2* --  CL 100 100 99 97 97 100 96 --  CO2 26 27 29 24 25 25 24  --  GLUCOSE 133* 123* 124* 133* 132* 126* 102* --  BUN 60* 53* 42* 67* 61* 51* 80* --  CREATININE 7.11* 6.89* 6.40* 9.18* 8.68* 7.56* 11.06* --  ALB -- -- -- -- -- -- -- --  CALCIUM 9.4 9.2 9.1 9.8 9.8 9.5 9.5 --  PHOS 4.7* 4.6 3.7 -- 5.3* 4.1 6.3* 6.3*   CBC  Lab 05/01/12 0815 04/29/12 0603 04/28/12 0625 04/25/12 1345  WBC 6.0 5.2 6.5 7.6  NEUTROABS -- -- -- 5.9  HGB 7.8* 8.5* 8.8* 9.0*  HCT 22.2* 24.5* 25.2* 24.5*  MCV 86.0 86.3 85.4 84.5  PLT 327 239 188 102*    Medications:      .  allopurinol  100 mg Oral Daily  . amLODipine  10 mg Oral QHS  . calcium carbonate  2 tablet Oral TID WC  . darbepoetin (ARANESP) injection - DIALYSIS  40 mcg Intravenous Q Thu-HD  . fentaNYL      . gabapentin  300 mg Oral QHS  . haloperidol lactate  5 mg Intramuscular Once  . lanthanum  1,000 mg Oral TID WC  . LORazepam  1 mg Oral TID  . memantine  10 mg Oral BID  . midazolam      . multivitamin  1 tablet Oral Daily  . senna  1 tablet Oral QHS     Assessment/ Plan:   1. AKI of unclear etiology. Serologic WU neg. UA + protein-await ANCA screen suspecting vasculitis given negative workup so far. UOP has increased, but is not being recorded- patient voids in commode. Await renal biopsy today for diagnostic and  prognostic purposes. The patient remains a little uncooperative and disoriented at times limiting care. Doing hemodialysis today and will monitor daily for emerging needs.  2. Anemia: on ESA, no overt blood loss.  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  4. Hypokalemia: We'll supplement this via by oral route. No obvious GI losses noted/reported.  5. Dementia/agitation and limited decision-making capacity: Appreciate input from psychiatry and hospitalist service. Currently, the patient is more placid on scheduled doses of lorazepam.   Zetta Bills, MD 05/01/2012, 10:58 AM

## 2012-05-01 NOTE — Progress Notes (Signed)
Interim Summary  [name] is a 71 year old African American gentleman with history of hypertension diabetes mild dementia gout rule was admitted on 04/13/2012 secondary to acute renal failure and a background of long-standing diabetes hypertension ACE/diuretic and NSAID use at that time with a creatinine of 10.79. ACE inhibitor was discontinued patient was monitored initially placed on IV fluids and and followed. Serologic workup which was done was negative. Urinalysis which was done did have some proteinuria. Per nephrology the patient did have a normal renal function approximately a month prior to admission. A renal consultation was obtained and patient was seen in consultation by Dr. Eliott Nine of nephrology. Patient's renal function worsened and his creatinine went up as high as 18.31. A hemodialysis catheter was subsequently placed and patient has been receiving intermittent hemodialysis. Patient is also been placed on Lasix and has had increasing urine output. His renal function has slowly decreased however not improved significantly. Patient during this hospitalization has had bouts of agitation and threatening to leave AMA. Patient has also been uncooperative and disoriented at times which has limited his care. A psychiatric consultation was obtained patient was seen by Dr. Ferol Luz was felt that patient lacked capacity and it was recommended that the patient decided to leave AMA and IVC be placed. Patient was also started on Namenda for his dementia. IVC has been placed. Patient is status post a renal biopsy on 6/11 and results are pending .Patient has also been treated for cellulitis during this hospitalization has had a hypoglycemic episode which has since resolved. His blood pressure medications have been titrated for better blood pressure control.     Subjective:   Chart reviewed. Patient seen across dialysis. Denies complaints and asking to go home. No chest pain, dyspnea or palpitations. No  dizziness or lightheadedness. No delusions or hallucinations or suicidal or homicidal ideations. Denies abdominal pain.  Objective  Vital signs in last 24 hours: Filed Vitals:   05/01/12 1000 05/01/12 1030 05/01/12 1135 05/01/12 1451  BP: 151/79 156/74  147/70  Pulse: 98 80 84 85  Temp:    98 F (36.7 C)  TempSrc:      Resp:   18 18  Height:      Weight:   79.75 kg (175 lb 13.1 oz)   SpO2:       Weight change:   Intake/Output Summary (Last 24 hours) at 05/01/12 1551 Last data filed at 05/01/12 1135  Gross per 24 hour  Intake      0 ml  Output      5 ml  Net     -5 ml    Physical Exam:  General Exam: Comfortable.  Respiratory System: Clear. No increased work of breathing.  Cardiovascular System: First and second heart sounds heard. Regular rate and rhythm. No JVD/murmurs.  Gastrointestinal System: Abdomen is non distended, soft and normal bowel sounds heard. Nontender. Central Nervous System: Alert and oriented to person and partly to place. No focal neurological deficits. Extremities: Symmetric 5 x 5 power.  Labs:  Basic Metabolic Panel:  Lab 05/01/12 9528 04/30/12 0500 04/29/12 0603  NA 140 140 140  K 2.9* 3.3* 3.3*  CL 100 100 99  CO2 26 27 29   GLUCOSE 133* 123* 124*  BUN 60* 53* 42*  CREATININE 7.11* 6.89* 6.40*  CALCIUM 9.4 9.2 9.1  ALB -- -- --  PHOS 4.7* 4.6 3.7   Liver Function Tests:  Lab 05/01/12 0816 04/30/12 0500 04/29/12 0603  AST -- -- --  ALT -- -- --  ALKPHOS -- -- --  BILITOT -- -- --  PROT -- -- --  ALBUMIN 3.8 3.6 3.5   No results found for this basename: LIPASE:3,AMYLASE:3 in the last 168 hours No results found for this basename: AMMONIA:3 in the last 168 hours CBC:  Lab 05/01/12 0815 04/29/12 0603 04/28/12 0625 04/25/12 1345  WBC 6.0 5.2 6.5 --  NEUTROABS -- -- -- 5.9  HGB 7.8* 8.5* 8.8* --  HCT 22.2* 24.5* 25.2* --  MCV 86.0 86.3 85.4 84.5  PLT 327 239 188 --   Cardiac Enzymes: No results found for this basename:  CKTOTAL:5,CKMB:5,CKMBINDEX:5,TROPONINI:5 in the last 168 hours CBG:  Lab 05/01/12 1215 04/30/12 2135 04/30/12 1643 04/30/12 1444 04/30/12 0819  GLUCAP 99 112* 221* 90 124*    Iron Studies: No results found for this basename: IRON,TIBC,TRANSFERRIN,FERRITIN in the last 72 hours Studies/Results: Ir US Guide Bx Asp/drain  04/30/2012  *RADIOLOGY REPORT*  Clinical Data: Diabetes, hypertension, acute renal failure requiring dialysis  ULTRASOUND LEFT KIDNEY LOWER POLE CORTEX CORE BIOPSY  Date:  04/30/2012 11:30:00  Radiologist:  Judie Petit. Ruel Favors, M.D.  Medications:  1 mg Versed, 25 mcg Fentanyl  Guidance:  Ultrasound  Fluoroscopy time:  None.  Sedation time:  15 minutes  Contrast volume:  None.  Complications:  No immediate  PROCEDURE/FINDINGS:  Informed consent was obtained from the patient following explanation of the procedure, risks, benefits and alternatives. The patient understands, agrees and consents for the procedure. All questions were addressed.  A time out was performed.  Maximal barrier sterile technique utilized including caps, mask, sterile gowns, sterile gloves, large sterile drape, hand hygiene, and betadine  The patient was positioned prone.  Imaging performed of both kidneys.  No hydronephrosis demonstrated on either side.  Left kidney lower pole cortex was localized.  Under sterile conditions and local anesthesia, a 16 gauge core biopsy was advanced under direct ultrasound guidance to lower pole cortex.  2 16 gauge core biopsies obtained placed in saline.  These biopsies were intact non fragmented.  Pathology review demonstrated adequacy.  Needle removed.  Post procedure imaging demonstrates no evidence of hemorrhage or hematoma.  The patient tolerated the biopsy well.  IMPRESSION: Successful ultrasound left kidney lower pole cortex 16 gauge core biopsy  Original Report Authenticated By: Judie Petit. Ruel Favors, M.D.   Medications:      . allopurinol  100 mg Oral Daily  . amLODipine  10 mg Oral  QHS  . calcium carbonate  2 tablet Oral TID WC  . darbepoetin (ARANESP) injection - DIALYSIS  100 mcg Intravenous Q Wed-HD  . feeding supplement  1 Container Oral Q24H  . fentaNYL      . gabapentin  300 mg Oral QHS  . haloperidol lactate  5 mg Intramuscular Once  . lanthanum  1,000 mg Oral TID WC  . LORazepam  1 mg Oral TID  . memantine  10 mg Oral BID  . midazolam      . multivitamin  1 tablet Oral Daily  . potassium chloride  20 mEq Oral Once  . senna  1 tablet Oral QHS  . DISCONTD: darbepoetin (ARANESP) injection - DIALYSIS  40 mcg Intravenous Q Thu-HD    I  have reviewed scheduled and prn medications.     Problem/Plan: Principal Problem:  *ARF (acute renal failure) Active Problems:  HTN (hypertension)  Hypoglycemia  DM (diabetes mellitus)  Hyperlipidemia  Cellulitis  Dementia  Thrombocytopenia  Constipation  Hypokalemia  1. Acute renal failure of unclear etiology: Currently  on hemodialysis. Nephrology following. Discussed with Dr. Allena Katz- further course of action based on renal biopsy  and ANCA results. 2. Hypokalemia: Replete aspirin and nephrology and follow BMP. 3. Anemia: Slight drop in hemoglobin over the last 48 hours. No obvious source of bleeding. Follow CBC. 4. Hypoglycemia: In patient with type 2 diabetes mellitus. Resolved. 5. Hypertension: Reasonably controlled. 6. Dementia with intermittent agitation: Agent lacks capacity and cannot sign out AMA. He is involuntarily committed. Continue Namenda and scheduled Ativan. 7. Thrombocytopenia: Resolved. 8. Left foot cellulitis: Improved. Antibiotics discontinued.   Maliya Marich 05/01/2012,3:51 PM  LOS: 18 days

## 2012-05-01 NOTE — Progress Notes (Signed)
INITIAL ADULT NUTRITION ASSESSMENT Date: 05/01/2012   Time: 2:11 PM  Reason for Assessment: Health History  ASSESSMENT: Male 71 y.o.  Dx: ARF (acute renal failure)  Hx:  Past Medical History  Diagnosis Date  . Diabetes mellitus   . DEMENTIA   . Neuromuscular disorder   . Hypertension   . Gout   . Arthritis   . Dementia 04/24/2012   Past Surgical History  Procedure Date  . Hernia repair    Related Meds:     . allopurinol  100 mg Oral Daily  . amLODipine  10 mg Oral QHS  . calcium carbonate  2 tablet Oral TID WC  . darbepoetin (ARANESP) injection - DIALYSIS  100 mcg Intravenous Q Wed-HD  . fentaNYL      . gabapentin  300 mg Oral QHS  . haloperidol lactate  5 mg Intramuscular Once  . lanthanum  1,000 mg Oral TID WC  . LORazepam  1 mg Oral TID  . memantine  10 mg Oral BID  . midazolam      . multivitamin  1 tablet Oral Daily  . potassium chloride  20 mEq Oral Once  . senna  1 tablet Oral QHS  . DISCONTD: darbepoetin (ARANESP) injection - DIALYSIS  40 mcg Intravenous Q Thu-HD   Ht: 6\' 2"  (188 cm)  Wt: 175 lb 13.1 oz (79.75 kg) s/p HD on 6/12  Ideal Wt: 86.4 kg % Ideal Wt: 92%  Usual Wt: unable to obtain, was approx 197 lb on admission % Usual Wt: n/a  Body mass index is 22.57 kg/(m^2). Weight is WNL.  Food/Nutrition Related Hx: unable to obtain from pt  Labs:  CMP     Component Value Date/Time   NA 140 05/01/2012 0816   K 2.9* 05/01/2012 0816   CL 100 05/01/2012 0816   CO2 26 05/01/2012 0816   GLUCOSE 133* 05/01/2012 0816   BUN 60* 05/01/2012 0816   CREATININE 7.11* 05/01/2012 0816   CALCIUM 9.4 05/01/2012 0816   PROT 5.8* 04/14/2012 0345   ALBUMIN 3.8 05/01/2012 0816   AST 82* 04/14/2012 0345   ALT 27 04/14/2012 0345   ALKPHOS 86 04/14/2012 0345   BILITOT 0.4 04/14/2012 0345   GFRNONAA 7* 05/01/2012 0816   GFRAA 8* 05/01/2012 0816   CBG (last 3)   Basename 05/01/12 1215 04/30/12 2135 04/30/12 1643  GLUCAP 99 112* 221*   Lab Results  Component Value Date     HGBA1C 7.0* 04/15/2012   Phosphorus  Date/Time Value Range Status  05/01/2012  8:16 AM 4.7* 2.3 - 4.6 mg/dL Final    Intake/Output Summary (Last 24 hours) at 05/01/12 1413 Last data filed at 05/01/12 1135  Gross per 24 hour  Intake    240 ml  Output    205 ml  Net     35 ml   Diet Order: Renal 80 -90  Supplements/Tube Feeding: none  IVF:    Estimated Nutritional Needs:   Kcal: 2000 - 2200 kcal Protein: 47 - 55 grams protein (will need at least 95 grams of protein daily if HD dependent) Fluid:  2- 2.2 liters daily  Pt admitted with AMS and pre-syncope. Known hx of HTN, DM, gout, and milk dementia. Pt with AKI of unclear etiology, pt s/p renal biopsy on 6/11 for diagnostic and prognostic purposes. HD initiated on 5/30 and renal function monitored daily for any HD needs.  Weight on admission was 197 lb. Wt currently 175 lb. Wt loss likely fluid related,  however pt at nutrition risk given dementia and prolonged hospital stay.  Sitter reports that pt is eating well. Meal records indicated intake of 100% of meals.  NUTRITION DIAGNOSIS: -Increased nutrient needs (NI-5.1).  Status: Ongoing  RELATED TO: acute illness   AS EVIDENCE BY: estimated needs  MONITORING/EVALUATION(Goals): Goal: Pt to consume at least 90% of estimated needs Monitor: weights, labs, PO intake, I/O's  EDUCATION NEEDS: -No education needs identified at this time  INTERVENTION: 1. Add Resource Breeze PO daily to provide additional calories to support ongoing medical comorbidities 2. RD to continue to follow nutrition care plan  Dietitian #: [phone]  DOCUMENTATION CODES Per approved criteria  -Not Applicable    Adair Laundry 05/01/2012, 2:11 PM

## 2012-05-02 ENCOUNTER — Other Ambulatory Visit: Payer: Self-pay

## 2012-05-02 DIAGNOSIS — D649 Anemia, unspecified: Secondary | ICD-10-CM | POA: Diagnosis present

## 2012-05-02 DIAGNOSIS — R112 Nausea with vomiting, unspecified: Secondary | ICD-10-CM

## 2012-05-02 DIAGNOSIS — N179 Acute kidney failure, unspecified: Secondary | ICD-10-CM

## 2012-05-02 DIAGNOSIS — E1169 Type 2 diabetes mellitus with other specified complication: Secondary | ICD-10-CM

## 2012-05-02 DIAGNOSIS — E876 Hypokalemia: Secondary | ICD-10-CM

## 2012-05-02 DIAGNOSIS — E11649 Type 2 diabetes mellitus with hypoglycemia without coma: Secondary | ICD-10-CM | POA: Diagnosis not present

## 2012-05-02 LAB — CBC
HCT: 26.2 % — ABNORMAL LOW (ref 39.0–52.0)
Hemoglobin: 9 g/dL — ABNORMAL LOW (ref 13.0–17.0)
MCHC: 34.4 g/dL (ref 30.0–36.0)
RBC: 3.01 MIL/uL — ABNORMAL LOW (ref 4.22–5.81)

## 2012-05-02 LAB — GLUCOSE, CAPILLARY
Glucose-Capillary: 99 mg/dL (ref 70–99)
Glucose-Capillary: 144 mg/dL — ABNORMAL HIGH (ref 70–99)

## 2012-05-02 LAB — BASIC METABOLIC PANEL
BUN: 31 mg/dL — ABNORMAL HIGH (ref 6–23)
CO2: 25 mEq/L (ref 19–32)
Chloride: 100 mEq/L (ref 96–112)
GFR calc non Af Amer: 13 mL/min — ABNORMAL LOW (ref >90)
Glucose, Bld: 213 mg/dL — ABNORMAL HIGH (ref 70–99)
Potassium: 3.2 mEq/L — ABNORMAL LOW (ref 3.5–5.1)

## 2012-05-02 MED ORDER — POTASSIUM CHLORIDE CRYS ER 20 MEQ PO TBCR
20.0000 meq | EXTENDED_RELEASE_TABLET | Freq: Once | ORAL | Status: AC
  Administered 2012-05-02: 20 meq via ORAL
  Filled 2012-05-02: qty 1

## 2012-05-02 MED ORDER — LORAZEPAM 1 MG PO TABS
2.0000 mg | ORAL_TABLET | Freq: Three times a day (TID) | ORAL | Status: DC
  Administered 2012-05-02 – 2012-05-06 (×12): 2 mg via ORAL
  Filled 2012-05-02 (×7): qty 2
  Filled 2012-05-02 (×2): qty 1
  Filled 2012-05-02 (×5): qty 2

## 2012-05-02 NOTE — Progress Notes (Signed)
Interim Summary  Mr. Harig is a 71 year old African American gentleman with history of hypertension diabetes mild dementia gout rule was admitted on 04/13/2012 secondary to acute renal failure and a background of long-standing diabetes hypertension ACE/diuretic and NSAID use at that time with a creatinine of 10.79. ACE inhibitor was discontinued patient was monitored initially placed on IV fluids and and followed. Serologic workup which was done was negative. Urinalysis which was done did have some proteinuria. Per nephrology the patient did have a normal renal function approximately a month prior to admission. A renal consultation was obtained and patient was seen in consultation by Dr. Eliott Nine of nephrology. Patient's renal function worsened and his creatinine went up as high as 18.31. A hemodialysis catheter was subsequently placed and patient has been receiving intermittent hemodialysis. Patient is also been placed on Lasix and has had increasing urine output. His renal function has slowly decreased however not improved significantly. Patient during this hospitalization has had bouts of agitation and threatening to leave AMA. Patient has also been uncooperative and disoriented at times which has limited his care. A psychiatric consultation was obtained patient was seen by Dr. Ferol Luz was felt that patient lacked capacity and it was recommended that the patient decided to leave AMA and IVC be placed. Patient was also started on Namenda for his dementia. IVC has been placed. Patient is status post a renal biopsy on 6/11 and results are pending .Patient has also been treated for cellulitis during this hospitalization has had a hypoglycemic episode which has since resolved. His blood pressure medications have been titrated for better blood pressure control.     Subjective:   Denies complaints. As per nursing, patient cooperative and without agitation. Still requests to go home.  Objective  Vital signs in  last 24 hours: Filed Vitals:   05/01/12 1801 05/01/12 2036 05/02/12 0520 05/02/12 0900  BP: 128/78 156/76 156/92 145/87  Pulse: 84 96 78 92  Temp: 98.2 F (36.8 C) 98.6 F (37 C) 97.8 F (36.6 C) 98.2 F (36.8 C)  TempSrc: Oral Oral Oral Oral  Resp: 18 15 18 18   Height:      Weight:      SpO2:  95% 100% 100%   Weight change:   Intake/Output Summary (Last 24 hours) at 05/02/12 1338 Last data filed at 05/02/12 0845  Gross per 24 hour  Intake    720 ml  Output      0 ml  Net    720 ml    Physical Exam:  General Exam: Comfortable. Sitting on a chair. Has 1:1 sitter. Respiratory System: Clear. No increased work of breathing.  Cardiovascular System: First and second heart sounds heard. Regular rate and rhythm. No JVD/murmurs.  Gastrointestinal System: Abdomen is non distended, soft and normal bowel sounds heard. Nontender. Central Nervous System: Alert and oriented to person and partly to place. No focal neurological deficits. Extremities: Symmetric 5 x 5 power.  Labs:  Basic Metabolic Panel:  Lab 05/02/12 0454 05/01/12 0816 04/30/12 0500 04/29/12 0603  NA 141 140 140 --  K 3.2* 2.9* 3.3* --  CL 100 100 100 --  CO2 25 26 27  --  GLUCOSE 213* 133* 123* --  BUN 31* 60* 53* --  CREATININE 4.36* 7.11* 6.89* --  CALCIUM 9.5 9.4 9.2 --  ALB -- -- -- --  PHOS -- 4.7* 4.6 3.7   Liver Function Tests:  Lab 05/01/12 0816 04/30/12 0500 04/29/12 0603  AST -- -- --  ALT -- -- --  ALKPHOS -- -- --  BILITOT -- -- --  PROT -- -- --  ALBUMIN 3.8 3.6 3.5   No results found for this basename: LIPASE:3,AMYLASE:3 in the last 168 hours No results found for this basename: AMMONIA:3 in the last 168 hours CBC:  Lab 05/02/12 0849 05/01/12 0815 04/29/12 0603 04/28/12 0625 04/25/12 1345  WBC 6.0 6.0 5.2 -- --  NEUTROABS -- -- -- -- 5.9  HGB 9.0* 7.8* 8.5* -- --  HCT 26.2* 22.2* 24.5* -- --  MCV 87.0 86.0 86.3 85.4 84.5  PLT 386 327 239 -- --   Cardiac Enzymes: No results found  for this basename: CKTOTAL:5,CKMB:5,CKMBINDEX:5,TROPONINI:5 in the last 168 hours CBG:  Lab 05/02/12 1153 05/02/12 0722 05/01/12 2128 05/01/12 1654 05/01/12 1215  GLUCAP 127* 159* 120* 95 99    Iron Studies: No results found for this basename: IRON,TIBC,TRANSFERRIN,FERRITIN in the last 72 hours Studies/Results: No results found. Medications:      . allopurinol  100 mg Oral Daily  . amLODipine  10 mg Oral QHS  . calcium carbonate  2 tablet Oral TID WC  . darbepoetin (ARANESP) injection - DIALYSIS  100 mcg Intravenous Q Wed-HD  . feeding supplement  1 Container Oral Q24H  . gabapentin  300 mg Oral QHS  . lanthanum  1,000 mg Oral TID WC  . LORazepam  2 mg Oral TID  . memantine  10 mg Oral BID  . multivitamin  1 tablet Oral Daily  . potassium chloride  20 mEq Oral Once  . potassium chloride  20 mEq Oral Once  . senna  1 tablet Oral QHS  . DISCONTD: LORazepam  1 mg Oral TID    I  have reviewed scheduled and prn medications.     Problem/Plan: Principal Problem:  *ARF (acute renal failure) Active Problems:  HTN (hypertension)  Hypoglycemia  DM (diabetes mellitus)  Hyperlipidemia  Cellulitis  Dementia  Thrombocytopenia  Constipation  Hypokalemia  1. Acute renal failure of unclear etiology: Nephrology following. ANCA serologies said to be negative. Renal Biopsy results pending. 2. Hypokalemia: Management per nephrology. Repleting by mouth. 3. Anemia: Stable. No obvious source of bleeding. Continue Aranesp. 4. Hypoglycemia: In patient with type 2 diabetes mellitus. Resolved. 5. Hypertension: Reasonably controlled. Continue Amlodipine. 6. Dementia with intermittent agitation: Patient lacks capacity and cannot sign out AMA. He is involuntarily committed. Continue Namenda and scheduled Ativan. 7. Thrombocytopenia: Resolved. 8. Left foot cellulitis: Improved. Antibiotics discontinued.  Disposition: Pending nephrology recommendations after renal biopsy  results.   Gary Mcneil 05/02/2012,1:38 PM  LOS: 19 days

## 2012-05-02 NOTE — Progress Notes (Signed)
Patient ID: Gary Mcneil, male   DOB: 1941/05/25, 71 y.o.   MRN: [id number]  I called [facility] this morning and they informed me that they will have a preliminary read of the renal biopsy this afternoon. ANCA serologies are reassuringly negative.  I did not physically see/examine the patient today to avoid confrontation but spoke to his girlfriend Britta Mccreedy and reviewed his vitals/labs.  VS: BP 145/87  Pulse 92  Temp 98.2 F (36.8 C) (Oral)  Resp 18  Ht 6\' 2"  (1.88 m)  Wt 79.75 kg (175 lb 13.1 oz)  BMI 22.57 kg/m2  SpO2 100%  BMET    Component Value Date/Time   NA 141 05/02/2012 0849   K 3.2* 05/02/2012 0849   CL 100 05/02/2012 0849   CO2 25 05/02/2012 0849   GLUCOSE 213* 05/02/2012 0849   BUN 31* 05/02/2012 0849   CREATININE 4.36* 05/02/2012 0849   CALCIUM 9.5 05/02/2012 0849   GFRNONAA 13* 05/02/2012 0849   GFRAA 15* 05/02/2012 0849    CBC    Component Value Date/Time   WBC 6.0 05/02/2012 0849   RBC 3.01* 05/02/2012 0849   HGB 9.0* 05/02/2012 0849   HCT 26.2* 05/02/2012 0849   PLT 386 05/02/2012 0849   MCV 87.0 05/02/2012 0849   MCH 29.9 05/02/2012 0849   MCHC 34.4 05/02/2012 0849   RDW 14.2 05/02/2012 0849   LYMPHSABS 1.0 04/25/2012 1345   MONOABS 0.5 04/25/2012 1345   EOSABS 0.2 04/25/2012 1345   BASOSABS 0.0 04/25/2012 1345   1.ARF: await renal biopsy results- no acute HD needs today 2. Hypokalemia: replete via PO route 3.Agitation/demetia and limited capacity: Up titrate Lorazepam  Zetta Bills MD [facility]. Office # [phone] Pager # [phone] 11:21 AM

## 2012-05-02 NOTE — Progress Notes (Signed)
CM continuing to follow for progression, awaiting Bx results and MD recommendations re ongoing needs. If pt Bx indicates that ongoing dialysis is not needed and pt is ready for d/c from acute hospital then CM will assist with [facility] needs as identified. This CM spoke with pt son who states that the plan will be to take pt home if bx results indicate that pt will not need ongoing dialysis. This is most concerning as pt has consistently been confused and agitated while in the inpatient setting. Per son the pt does live with significant other however she works and pt is alone and drives, sometimes getting lost but per son , " always finds his way back home".   Will continue to follow.  Johny Shock RN MPH Case manager [phone]

## 2012-05-03 DIAGNOSIS — E876 Hypokalemia: Secondary | ICD-10-CM

## 2012-05-03 DIAGNOSIS — E1169 Type 2 diabetes mellitus with other specified complication: Secondary | ICD-10-CM

## 2012-05-03 DIAGNOSIS — N179 Acute kidney failure, unspecified: Secondary | ICD-10-CM

## 2012-05-03 DIAGNOSIS — R112 Nausea with vomiting, unspecified: Secondary | ICD-10-CM

## 2012-05-03 DIAGNOSIS — IMO0002 Reserved for concepts with insufficient information to code with codable children: Secondary | ICD-10-CM

## 2012-05-03 LAB — GLUCOSE, CAPILLARY

## 2012-05-03 LAB — CBC
Hemoglobin: 9.2 g/dL — ABNORMAL LOW (ref 13.0–17.0)
RBC: 3.08 MIL/uL — ABNORMAL LOW (ref 4.22–5.81)

## 2012-05-03 LAB — RENAL FUNCTION PANEL
CO2: 27 mEq/L (ref 19–32)
Chloride: 102 mEq/L (ref 96–112)
Creatinine, Ser: 5.2 mg/dL — ABNORMAL HIGH (ref 0.50–1.35)
GFR calc Af Amer: 12 mL/min — ABNORMAL LOW (ref >90)
GFR calc non Af Amer: 10 mL/min — ABNORMAL LOW (ref >90)
Sodium: 143 mEq/L (ref 135–145)

## 2012-05-03 MED ORDER — LORAZEPAM 2 MG/ML IJ SOLN
1.0000 mg | Freq: Two times a day (BID) | INTRAMUSCULAR | Status: DC | PRN
  Administered 2012-05-03 – 2012-05-06 (×3): 1 mg via INTRAVENOUS
  Filled 2012-05-03 (×4): qty 1

## 2012-05-03 MED ORDER — HALOPERIDOL LACTATE 5 MG/ML IJ SOLN
5.0000 mg | Freq: Once | INTRAMUSCULAR | Status: AC
  Administered 2012-05-03: 5 mg via INTRAVENOUS

## 2012-05-03 MED ORDER — DIPHENHYDRAMINE HCL 50 MG/ML IJ SOLN
INTRAMUSCULAR | Status: AC
  Filled 2012-05-03: qty 1

## 2012-05-03 MED ORDER — DIPHENHYDRAMINE HCL 50 MG/ML IJ SOLN
25.0000 mg | Freq: Once | INTRAMUSCULAR | Status: AC
  Administered 2012-05-03: 25 mg via INTRAVENOUS

## 2012-05-03 MED ORDER — HALOPERIDOL LACTATE 5 MG/ML IJ SOLN
INTRAMUSCULAR | Status: AC
  Filled 2012-05-03: qty 1

## 2012-05-03 MED ORDER — POTASSIUM CHLORIDE CRYS ER 20 MEQ PO TBCR
20.0000 meq | EXTENDED_RELEASE_TABLET | Freq: Two times a day (BID) | ORAL | Status: AC
  Administered 2012-05-03: 20 meq via ORAL
  Filled 2012-05-03 (×2): qty 1

## 2012-05-03 NOTE — Progress Notes (Signed)
Spoke with Dr.Webb about if we can get an order to use his pigtail because pt needs prn IV ativan. Orders received.

## 2012-05-03 NOTE — Progress Notes (Signed)
Interim Summary  Mr. Lichtenwalner is a 71 year old African American gentleman with history of hypertension diabetes mild dementia gout rule was admitted on 04/13/2012 secondary to acute renal failure and a background of long-standing diabetes hypertension ACE/diuretic and NSAID use at that time with a creatinine of 10.79. ACE inhibitor was discontinued patient was monitored initially placed on IV fluids and and followed. Serologic workup which was done was negative. Urinalysis which was done did have some proteinuria. Per nephrology the patient did have a normal renal function approximately a month prior to admission. A renal consultation was obtained and patient was seen in consultation by Dr. Eliott Nine of nephrology. Patient's renal function worsened and his creatinine went up as high as 18.31. A hemodialysis catheter was subsequently placed and patient has been receiving intermittent hemodialysis. Patient is also been placed on Lasix and has had increasing urine output. His renal function has slowly decreased however not improved significantly. Patient during this hospitalization has had bouts of agitation and threatening to leave AMA. Patient has also been uncooperative and disoriented at times which has limited his care. A psychiatric consultation was obtained patient was seen by Dr. Ferol Luz was felt that patient lacked capacity and it was recommended that the patient decided to leave AMA and IVC be placed. Patient was also started on Namenda for his dementia. IVC has been placed. Patient is status post a renal biopsy on 6/11 and results are pending .Patient has also been treated for cellulitis during this hospitalization has had a hypoglycemic episode which has since resolved. His blood pressure medications have been titrated for better blood pressure control.     Subjective:   Patient intermittently getting restless/mild agitation and demanding to go home. He's also asking for the dialysis catheter to be removed.  Denies any physical complaints.  Objective  Vital signs in last 24 hours: Filed Vitals:   05/02/12 1815 05/02/12 2100 05/03/12 0552 05/03/12 0800  BP: 142/77 133/78 146/84 134/89  Pulse: 98 88 85 87  Temp: 98.8 F (37.1 C) 98.4 F (36.9 C) 98 F (36.7 C) 98.1 F (36.7 C)  TempSrc: Oral Oral Oral Oral  Resp: 18 18 18 20   Height:      Weight:   84.913 kg (187 lb 3.2 oz)   SpO2: 100% 100% 95% 96%   Weight change: 4.513 kg (9 lb 15.2 oz)  Intake/Output Summary (Last 24 hours) at 05/03/12 1529 Last data filed at 05/03/12 0900  Gross per 24 hour  Intake    360 ml  Output      0 ml  Net    360 ml    Physical Exam:  General Exam: Comfortable.Has 1:1 sitter. Respiratory System: Clear. No increased work of breathing.  Cardiovascular System: First and second heart sounds heard. Regular rate and rhythm. No JVD/murmurs.  Gastrointestinal System: Abdomen is non distended, soft and normal bowel sounds heard. Nontender. Central Nervous System: Alert and oriented to person and place. No focal neurological deficits. Extremities: Symmetric 5 x 5 power.  Labs:  Basic Metabolic Panel:  Lab 05/03/12 1610 05/02/12 0849 05/01/12 0816 04/30/12 0500  NA 143 141 140 --  K 3.4* 3.2* 2.9* --  CL 102 100 100 --  CO2 27 25 26  --  GLUCOSE 142* 213* 133* --  BUN 39* 31* 60* --  CREATININE 5.20* 4.36* 7.11* --  CALCIUM 9.9 9.5 9.4 --  ALB -- -- -- --  PHOS 2.7 -- 4.7* 4.6   Liver Function Tests:  Lab 05/03/12 0630  05/01/12 0816 04/30/12 0500  AST -- -- --  ALT -- -- --  ALKPHOS -- -- --  BILITOT -- -- --  PROT -- -- --  ALBUMIN 4.1 3.8 3.6   No results found for this basename: LIPASE:3,AMYLASE:3 in the last 168 hours No results found for this basename: AMMONIA:3 in the last 168 hours CBC:  Lab 05/03/12 0630 05/02/12 0849 05/01/12 0815 04/29/12 0603 04/28/12 0625  WBC 5.9 6.0 6.0 -- --  NEUTROABS -- -- -- -- --  HGB 9.2* 9.0* 7.8* -- --  HCT 26.8* 26.2* 22.2* -- --  MCV 87.0  87.0 86.0 86.3 85.4  PLT 380 386 327 -- --   Cardiac Enzymes: No results found for this basename: CKTOTAL:5,CKMB:5,CKMBINDEX:5,TROPONINI:5 in the last 168 hours CBG:  Lab 05/03/12 1201 05/03/12 0728 05/02/12 2229 05/02/12 1635 05/02/12 1153  GLUCAP 117* 195* 144* 99 127*    Iron Studies: No results found for this basename: IRON,TIBC,TRANSFERRIN,FERRITIN in the last 72 hours Studies/Results: No results found. Medications:      . allopurinol  100 mg Oral Daily  . amLODipine  10 mg Oral QHS  . calcium carbonate  2 tablet Oral TID WC  . darbepoetin (ARANESP) injection - DIALYSIS  100 mcg Intravenous Q Wed-HD  . feeding supplement  1 Container Oral Q24H  . gabapentin  300 mg Oral QHS  . lanthanum  1,000 mg Oral TID WC  . LORazepam  2 mg Oral TID  . memantine  10 mg Oral BID  . multivitamin  1 tablet Oral Daily  . potassium chloride  20 mEq Oral BID  . senna  1 tablet Oral QHS    I  have reviewed scheduled and prn medications.     Problem/Plan: Principal Problem:  *ARF (acute renal failure) Active Problems:  HTN (hypertension)  Hypoglycemia  DM (diabetes mellitus)  Hyperlipidemia  Cellulitis  Dementia  Thrombocytopenia  Constipation  Hypokalemia  Anemia  Type II diabetes mellitus with hypoglycemia  1. Acute renal failure secondary to ATN of unclear etiology: Nephrology following. ANCA serologies said to be negative. As per nephrology, patient needs to be monitored in house until his creatinine starts trending down. 2. Hypokalemia: Management per nephrology. Repleting by mouth. 3. Anemia: Stable. No obvious source of bleeding. Continue Aranesp. 4. Hypoglycemia: In patient with type 2 diabetes mellitus. Resolved. 5. Hypertension: Reasonably controlled. Continue Amlodipine. 6. Dementia with intermittent agitation: Patient lacks capacity and cannot sign out AMA. He is involuntarily committed. Continue Namenda and scheduled Ativan. Discussed with Dr. Ferol Luz who  recommends continuing scheduled Ativan 2 mg by mouth 3 times a day and can use when necessary additional doses of Ativan for agitation and may consider Depakote 500 mg by mouth/IV twice a day. 7. Thrombocytopenia: Resolved. 8. Left foot cellulitis: Improved. Antibiotics discontinued.  Disposition: Discharge when renal function stabilizes. Called and discussed with patient's son Mr. Erasto Sleight Jr-updated care and answered questions. Son indicates that he would like to take patient home post discharge and is not keen on assisted living facility. He indicates that patient lived at home prior to admission with similar periodic memory impairment and confusion but was still able to function. Patient's girlfriend stays with him but has to go out for a few hours to work. He also indicates that patient would not accept/cooperate if placed in an assisted living facility (similar to what he is doing in the hospital). He indicates that his father is a very independent person and would be best served in  his own home. He also indicated that he could have his father come and stay with him but the patient may not even accept that option.   Calleigh Lafontant 05/03/2012,3:29 PM  LOS: 20 days

## 2012-05-03 NOTE — Progress Notes (Addendum)
Progress discussed with Dr. Waymon Amato and Dr. Allena Katz:  Pt was adamant about leaving this am per phone call from Dr. Waymon Amato.  Query was: what medication may pt have to decrease his impatience and agitation. Report about renal bx, reports acute tubular necrosis with indication that improvement may be seen over the next couple of days.  Pt remains calm in room.  Pt is seen this afternoon.  He is sitting in a chair very calm and smiling.  He volunteers statement that he is trying to be patient and do what will be the best for himself in the long run.  He admits he is less anxious and less focused on leaving at the moment.  He continues with a very broad smile.  He indicates that he understands that the report implies a favorable outcome and he needs to wait a few days to determine how the kidneys will respond. He demonstrates forward thinking that he did not express a few days ago.  He realizes that he will wait and be able to return home.  He has a positive attitude.  He has his GF visit and will ask her to bring his pet to visit.  Dr. Allena Katz agrees.  RECOMMENDATION:  1. Pt demonstrates today [as before] he has understanding of the medical plan and agrees he is willing to wait until Monday to learn about the results. [discussed with Dr. Waymon Amato this am 05/03/12 2. Suggest Ativan 2 mg 3 x daily with 1 additional dose 2 mg as needed for agitation/anxiety 3  Suggest Depakote 500 mg 2 X daily for mood regulation IF needed 3.. Weekend Consults needs to see pt. 4. Continue IVC Gary Mcneil J. Gary Luz, MD Psychiatrist  05/03/2012 4:48 PM

## 2012-05-03 NOTE — Progress Notes (Signed)
Pt agitated walking out of his room, and up and down the hallway saying "I need this thing taken out of my neck, or I'm a pull it out". Pt is referring to his HD catheter. Security was called. Dr.Hongalgi was called and he said to call renal to see if we can access his pigtail on his HD catheter; and he would adjust his prn ativan dosage. Pt sitter is with him and charge nurse Mathis Fare in hallway as well. Will continue to monitor.

## 2012-05-03 NOTE — Progress Notes (Signed)
Patient ID: Gary Mcneil, male   DOB: 16-Nov-1941, 71 y.o.   MRN: [id number]   [facility] Progress Note    Subjective:   Agitated earlier today in the morning with several things including while he's still admitted to the hospital    Objective:   BP 134/89  Pulse 87  Temp 98.1 F (36.7 C) (Oral)  Resp 20  Ht 6\' 2"  (1.88 m)  Wt 84.913 kg (187 lb 3.2 oz)  BMI 24.03 kg/m2  SpO2 96%  Intake/Output Summary (Last 24 hours) at 05/03/12 1354 Last data filed at 05/03/12 0900  Gross per 24 hour  Intake    720 ml  Output      0 ml  Net    720 ml   Weight change: 4.513 kg (9 lb 15.2 oz)  Physical Exam: Gen: Comfortably sitting up on the side of his bed, eating lunch-currently in pleasant disposition CVS: Pulse regular in rate and rhythm, heart sounds S1 and S2 normal Resp: Clear to auscultation bilaterally, no loss/rhonchi Abd: Soft, flat, nontender and bowel sounds are normal Ext: No lower extremity edema  Imaging: No results found.  Labs: BMET  Lab 05/03/12 0630 05/02/12 0849 05/01/12 0816 04/30/12 0500 04/29/12 0603 04/28/12 0625 04/27/12 0817  NA 143 141 140 140 140 139 138  K 3.4* 3.2* 2.9* 3.3* 3.3* 3.0* 3.5  CL 102 100 100 100 99 97 97  CO2 27 25 26 27 29 24 25   GLUCOSE 142* 213* 133* 123* 124* 133* 132*  BUN 39* 31* 60* 53* 42* 67* 61*  CREATININE 5.20* 4.36* 7.11* 6.89* 6.40* 9.18* 8.68*  ALB -- -- -- -- -- -- --  CALCIUM 9.9 9.5 9.4 9.2 9.1 9.8 9.8  PHOS 2.7 -- 4.7* 4.6 3.7 -- 5.3*   CBC  Lab 05/03/12 0630 05/02/12 0849 05/01/12 0815 04/29/12 0603  WBC 5.9 6.0 6.0 5.2  NEUTROABS -- -- -- --  HGB 9.2* 9.0* 7.8* 8.5*  HCT 26.8* 26.2* 22.2* 24.5*  MCV 87.0 87.0 86.0 86.3  PLT 380 386 327 239    Medications:      . allopurinol  100 mg Oral Daily  . amLODipine  10 mg Oral QHS  . calcium carbonate  2 tablet Oral TID WC  . darbepoetin (ARANESP) injection - DIALYSIS  100 mcg Intravenous Q Wed-HD  . feeding supplement  1 Container Oral Q24H   . gabapentin  300 mg Oral QHS  . lanthanum  1,000 mg Oral TID WC  . LORazepam  2 mg Oral TID  . memantine  10 mg Oral BID  . multivitamin  1 tablet Oral Daily  . senna  1 tablet Oral QHS     Assessment/ Plan:   1. AKI secondary to ATN of unclear etiology : Renal biopsy showed diffuse ATN with some podocyte effacement likely from preceding nonsteroidal anti-inflammatory use. Informed the patient regarding the finding any informed him of the implications both good and bad-we would not need to undertake cytotoxic therapy however he would need to be here until acute renal failure resolved as he could not be placed at an outpatient dialysis center. We'll track he's creatinine closely and discontinue his dialysis catheter that seems to be a great point of aggravation to him when permissible. 2. Anemia: on ESA, no overt blood loss.  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  4. Hypokalemia: We'll supplement this via by oral route. No obvious GI losses noted/reported.  5. Dementia/agitation and limited decision-making capacity:  Appreciate input from psychiatry and hospitalist service. Currently, the patient is more placid on scheduled doses of lorazepam.   Zetta Bills, MD 05/03/2012, 1:54 PM

## 2012-05-04 DIAGNOSIS — E876 Hypokalemia: Secondary | ICD-10-CM

## 2012-05-04 DIAGNOSIS — R112 Nausea with vomiting, unspecified: Secondary | ICD-10-CM

## 2012-05-04 DIAGNOSIS — F0390 Unspecified dementia without behavioral disturbance: Secondary | ICD-10-CM

## 2012-05-04 DIAGNOSIS — N179 Acute kidney failure, unspecified: Secondary | ICD-10-CM

## 2012-05-04 LAB — RENAL FUNCTION PANEL
CO2: 25 mEq/L (ref 19–32)
Calcium: 9.7 mg/dL (ref 8.4–10.5)
Chloride: 104 mEq/L (ref 96–112)
GFR calc Af Amer: 12 mL/min — ABNORMAL LOW (ref >90)
GFR calc non Af Amer: 10 mL/min — ABNORMAL LOW (ref >90)
Sodium: 142 mEq/L (ref 135–145)

## 2012-05-04 LAB — HEPATIC FUNCTION PANEL
Albumin: 3.8 g/dL (ref 3.5–5.2)
Total Bilirubin: 0.2 mg/dL — ABNORMAL LOW (ref 0.3–1.2)

## 2012-05-04 LAB — GLUCOSE, CAPILLARY
Glucose-Capillary: 118 mg/dL — ABNORMAL HIGH (ref 70–99)
Glucose-Capillary: 153 mg/dL — ABNORMAL HIGH (ref 70–99)
Glucose-Capillary: 142 mg/dL — ABNORMAL HIGH (ref 70–99)
Glucose-Capillary: 187 mg/dL — ABNORMAL HIGH (ref 70–99)

## 2012-05-04 MED ORDER — DIVALPROEX SODIUM 500 MG PO DR TAB
500.0000 mg | DELAYED_RELEASE_TABLET | Freq: Two times a day (BID) | ORAL | Status: DC
  Administered 2012-05-04 – 2012-05-06 (×5): 500 mg via ORAL
  Filled 2012-05-04 (×6): qty 1

## 2012-05-04 MED ORDER — POTASSIUM CHLORIDE CRYS ER 20 MEQ PO TBCR
20.0000 meq | EXTENDED_RELEASE_TABLET | Freq: Three times a day (TID) | ORAL | Status: AC
  Administered 2012-05-04 (×3): 20 meq via ORAL
  Filled 2012-05-04 (×3): qty 1

## 2012-05-04 MED ORDER — SODIUM CHLORIDE 0.9 % IJ SOLN
10.0000 mL | INTRAMUSCULAR | Status: DC | PRN
  Administered 2012-05-04 – 2012-05-06 (×2): 10 mL

## 2012-05-04 NOTE — Progress Notes (Signed)
Upon shift change, patient was very upset and ready to go home.  He had his bags packed and was sitting patiently, yet ready to leave the hospital.  While I was getting report on the patient, the day shift charge nurse and the oncoming night shift nurse were both going into the patients room to give 1 mg of ativan.    I went in the room to introduce myself to the patient and he was eating a snack and fidgety and ready to go.  I told him I would be back in a few minutes to check on him.  I left the patient in the room with the sitter to go and introduce myself to my other patients.  As I was sitting at the front desk reviewing my new orders, Gary Mcneil daughter, Jasmine December, came up to the desk to inquire about her fathers plan of care.  After speaking with her, and she walked back to the patients room and within 10 minutes, the patient was walking up the hallway with his bag and ready to go.  He stated that he was not going back into "that room" and he was "ready to go". As he advanced up the hallway, security was called and the came an intercepted him at the hallway past the elevators. The police also were on hand if needed.    The patients daughter was able to talk him into going back into his room.  I contacted to the on call MD and received an order to give haldol and benadryl.  Meds were given and the patient moved from the chair to the bed where he became very drowsy.  Vitals normal and patient now stable.    Received a call from the MD on call for a STAT EKG due to the patient receiving haldol.  After she spoke with a previous MD, he informed her that he had prolonged QT intervals.  EKG was done and patient did have prolonged QT interval.  MD notified.  Will continue to monitor the patient.

## 2012-05-04 NOTE — Progress Notes (Signed)
Interim Summary  [name] is a 71 year old African American gentleman with history of hypertension diabetes mild dementia gout rule was admitted on 04/13/2012 secondary to acute renal failure and a background of long-standing diabetes hypertension ACE/diuretic and NSAID use at that time with a creatinine of 10.79. ACE inhibitor was discontinued patient was monitored initially placed on IV fluids and and followed. Serologic workup which was done was negative. Urinalysis which was done did have some proteinuria. Per nephrology the patient did have a normal renal function approximately a month prior to admission. A renal consultation was obtained and patient was seen in consultation by Dr. Eliott Nine of nephrology. Patient's renal function worsened and his creatinine went up as high as 18.31. A hemodialysis catheter was subsequently placed and patient has been receiving intermittent hemodialysis. Patient is also been placed on Lasix and has had increasing urine output. His renal function has slowly decreased however not improved significantly. Patient during this hospitalization has had bouts of agitation and threatening to leave AMA. Patient has also been uncooperative and disoriented at times which has limited his care. A psychiatric consultation was obtained patient was seen by Dr. Ferol Luz was felt that patient lacked capacity and it was recommended that the patient decided to leave AMA and IVC be placed. Patient was also started on Namenda for his dementia. IVC has been placed. Patient is status post a renal biopsy on 6/11  which showed findings suggestive of diffuse ATN with some podocyte effacementf .Patient has also been treated for cellulitis during this hospitalization has had a hypoglycemic episode which has since resolved. His blood pressure medications have been titrated for better blood pressure control.     Subjective:   Overnight events noted. Was agitated overnight and received a dose of Haldol. This  morning patient was calm and cooperative but still looking to go home. Denies any physical complaints.  Objective  Vital signs in last 24 hours: Filed Vitals:   05/03/12 1833 05/03/12 2123 05/04/12 0513 05/04/12 0950  BP: 131/84 155/91 130/74 132/79  Pulse: 84 88 93 104  Temp: 97.6 F (36.4 C) 97.8 F (36.6 C) 97.7 F (36.5 C) 99.1 F (37.3 C)  TempSrc: Oral Oral Oral Oral  Resp: 20 18 18 12   Height:  6\' 2"  (1.88 m)    Weight:  84.7 kg (186 lb 11.7 oz)    SpO2: 97% 97% 100% 98%   Weight change: -0.213 kg (-7.5 oz)  Intake/Output Summary (Last 24 hours) at 05/04/12 1403 Last data filed at 05/04/12 0951  Gross per 24 hour  Intake    960 ml  Output      0 ml  Net    960 ml    Physical Exam:  General Exam: Comfortable.Has 1:1 sitter. Respiratory System: Clear. No increased work of breathing.  Cardiovascular System: First and second heart sounds heard. Regular rate and rhythm. No JVD/murmurs.  Gastrointestinal System: Abdomen is non distended, soft and normal bowel sounds heard. Nontender. Central Nervous System: Alert and oriented to person and place. No focal neurological deficits. Extremities: Symmetric 5 x 5 power. Psychiatric: Pleasant and cooperative at this time. Denies delusions, or hallucinations or suicidal or homicidal ideations.  Labs:  Basic Metabolic Panel:  Lab 05/04/12 8119 05/03/12 0630 05/02/12 0849 05/01/12 0816  NA 142 143 141 --  K 3.3* 3.4* 3.2* --  CL 104 102 100 --  CO2 25 27 25  --  GLUCOSE 118* 142* 213* --  BUN 43* 39* 31* --  CREATININE 5.30*  5.20* 4.36* --  CALCIUM 9.7 9.9 9.5 --  ALB -- -- -- --  PHOS 2.6 2.7 -- 4.7*   Liver Function Tests:  Lab 05/04/12 0552 05/03/12 0630 05/01/12 0816  AST -- -- --  ALT -- -- --  ALKPHOS -- -- --  BILITOT -- -- --  PROT -- -- --  ALBUMIN 3.8 4.1 3.8   No results found for this basename: LIPASE:3,AMYLASE:3 in the last 168 hours No results found for this basename: AMMONIA:3 in the last 168  hours CBC:  Lab 05/03/12 0630 05/02/12 0849 05/01/12 0815 04/29/12 0603 04/28/12 0625  WBC 5.9 6.0 6.0 -- --  NEUTROABS -- -- -- -- --  HGB 9.2* 9.0* 7.8* -- --  HCT 26.8* 26.2* 22.2* -- --  MCV 87.0 87.0 86.0 86.3 85.4  PLT 380 386 327 -- --   Cardiac Enzymes: No results found for this basename: CKTOTAL:5,CKMB:5,CKMBINDEX:5,TROPONINI:5 in the last 168 hours CBG:  Lab 05/04/12 1133 05/04/12 0759 05/03/12 2128 05/03/12 1701 05/03/12 1201  GLUCAP 153* 118* 149* 156* 117*    Iron Studies: No results found for this basename: IRON,TIBC,TRANSFERRIN,FERRITIN in the last 72 hours Studies/Results: No results found. Medications:      . allopurinol  100 mg Oral Daily  . amLODipine  10 mg Oral QHS  . calcium carbonate  2 tablet Oral TID WC  . darbepoetin (ARANESP) injection - DIALYSIS  100 mcg Intravenous Q Wed-HD  . diphenhydrAMINE      . diphenhydrAMINE  25 mg Intravenous Once  . divalproex  500 mg Oral Q12H  . feeding supplement  1 Container Oral Q24H  . gabapentin  300 mg Oral QHS  . haloperidol lactate      . haloperidol lactate  5 mg Intravenous Once  . lanthanum  1,000 mg Oral TID WC  . LORazepam  2 mg Oral TID  . memantine  10 mg Oral BID  . multivitamin  1 tablet Oral Daily  . potassium chloride  20 mEq Oral BID  . potassium chloride  20 mEq Oral TID  . senna  1 tablet Oral QHS    I  have reviewed scheduled and prn medications.     Problem/Plan: Principal Problem:  *ARF (acute renal failure) Active Problems:  HTN (hypertension)  Hypoglycemia  DM (diabetes mellitus)  Hyperlipidemia  Cellulitis  Dementia  Thrombocytopenia  Constipation  Hypokalemia  Anemia  Type II diabetes mellitus with hypoglycemia  1. Acute renal failure secondary to ATN of unclear etiology:  nephrology following and management per nephrology. No acute dialysis needs seen at this time. Continue to monitor renal functions with hope for improvement. 2. Dementia with intermittent  agitation: Patient lacks capacity and cannot sign out AMA. He is involuntarily committed. Continue Namenda and scheduled Ativan. Possible sundowning with agitation at night. As per psychiatry recommendation, will start Depakote 500 mg by mouth twice a day. Monitor LFTs and CBCs closely. 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 He may not have to be on Ativan or Depakote for a long time on discharge. 3. Hypokalemia: Management per nephrology. Repleting by mouth. 4. Anemia: Stable. No obvious source of bleeding. Continue Aranesp. 5. Hypoglycemia: In patient with type 2 diabetes mellitus. Resolved. 6. Hypertension: Reasonably controlled. Continue Amlodipine. 7. Thrombocytopenia: Resolved. 8. Left foot cellulitis: Improved. Antibiotics discontinued.  Disposition: Discharge when cleared by nephrology.Discussed with patient's daughter Ms. Karlene Einstein and updated care and answered questions.  Mingo Siegert 05/04/2012,2:03 PM  LOS: 21 days

## 2012-05-04 NOTE — Progress Notes (Signed)
Patient ID: Gary Mcneil, male   DOB: 1941-04-27, 71 y.o.   MRN: [id number]   [name] Progress Note    Subjective:   Agitated and combative last night for which he got some haloperidol. Recommendations by psychiatry to start him on scheduled doses of Depakote to help with mood.    Objective:   BP 132/79  Pulse 104  Temp 99.1 F (37.3 C) (Oral)  Resp 12  Ht 6\' 2"  (1.88 m)  Wt 84.7 kg (186 lb 11.7 oz)  BMI 23.97 kg/m2  SpO2 98%  Intake/Output Summary (Last 24 hours) at 05/04/12 1059 Last data filed at 05/04/12 0951  Gross per 24 hour  Intake   1080 ml  Output      0 ml  Net   1080 ml   Weight change: -0.213 kg (-7.5 oz)  Physical Exam: Gen: Comfortably sitting up in his chair-currently in pleasant disposition and engages in good sensible conversation CVS: Pulse regular in rate and rhythm, heart sounds S1 and S2 normal Resp: Clear to auscultation bilaterally, no rales/rhonchi Abd: Soft, flat, nontender and bowel sounds are normal Ext: No lower extremity edema  Imaging: No results found.  Labs: BMET  Lab 05/04/12 0552 05/03/12 0630 05/02/12 0849 05/01/12 0816 04/30/12 0500 04/29/12 0603 04/28/12 0625  NA 142 143 141 140 140 140 139  K 3.3* 3.4* 3.2* 2.9* 3.3* 3.3* 3.0*  CL 104 102 100 100 100 99 97  CO2 25 27 25 26 27 29 24   GLUCOSE 118* 142* 213* 133* 123* 124* 133*  BUN 43* 39* 31* 60* 53* 42* 67*  CREATININE 5.30* 5.20* 4.36* 7.11* 6.89* 6.40* 9.18*  ALB -- -- -- -- -- -- --  CALCIUM 9.7 9.9 9.5 9.4 9.2 9.1 9.8  PHOS 2.6 2.7 -- 4.7* 4.6 3.7 --   CBC  Lab 05/03/12 0630 05/02/12 0849 05/01/12 0815 04/29/12 0603  WBC 5.9 6.0 6.0 5.2  NEUTROABS -- -- -- --  HGB 9.2* 9.0* 7.8* 8.5*  HCT 26.8* 26.2* 22.2* 24.5*  MCV 87.0 87.0 86.0 86.3  PLT 380 386 327 239    Medications:      . allopurinol  100 mg Oral Daily  . amLODipine  10 mg Oral QHS  . calcium carbonate  2 tablet Oral TID WC  . darbepoetin (ARANESP) injection - DIALYSIS  100  mcg Intravenous Q Wed-HD  . diphenhydrAMINE      . diphenhydrAMINE  25 mg Intravenous Once  . divalproex  500 mg Oral Q12H  . feeding supplement  1 Container Oral Q24H  . gabapentin  300 mg Oral QHS  . haloperidol lactate      . haloperidol lactate  5 mg Intravenous Once  . lanthanum  1,000 mg Oral TID WC  . LORazepam  2 mg Oral TID  . memantine  10 mg Oral BID  . multivitamin  1 tablet Oral Daily  . potassium chloride  20 mEq Oral BID  . senna  1 tablet Oral QHS     Assessment/ Plan:   1. AKI secondary to ATN of unclear etiology : Renal biopsy showed diffuse ATN with some podocyte effacement likely from preceding nonsteroidal anti-inflammatory use. No acute dialysis needs noted today and renal function seems to have plateaued over the last 48 hours-confirmed the patient that we'll continue to monitor his renal function and discontinue dialysis catheter when is permissible. He understands that this process may take another 2-3 days and is willing to stay around  for the duration at this time. Urine output is not quantified as the patient voids in his commode however apparently "adequate" per patient. 2. Anemia: on ESA, no overt blood loss.  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  4. Hypokalemia: We'll supplement this via by oral route. No obvious GI losses noted/reported.  5. Dementia/agitation and limited decision-making capacity: Appreciate input from psychiatry and hospitalist service. Currently, the patient is more placid on scheduled doses of lorazepam.   Zetta Bills, MD 05/04/2012, 10:59 AM

## 2012-05-04 NOTE — Consult Note (Signed)
Patient Identification:  Gary Mcneil Date of Evaluation:  05/04/2012 Reason for Consult:  Pt threatens to leave AMA  Referring Provider: Dr. Janee Morn History of Present Illness:Pt came to ED unconscious with CKD and very abnormal labs.  He has been in HD for nearly 2 wks and had renal biopsy was to be done  Recently. He was threatning to leave AMA recently  Interval Hx:  Was seen with family with his permission. Very limited ability to understand why needs to be here. Family has very limited insight into his medical issues. Son is more willing to understand. Pt thinks he is ready to leave and cant think any reason for being in the hospital. Reports he knows all about his medical issues but could not tell much when asked. appeared angry at time.  Past Psychiatric History:unknown   Past Medical History:     Past Medical History  Diagnosis Date  . Diabetes mellitus   . DEMENTIA   . Neuromuscular disorder   . Hypertension   . Gout   . Arthritis   . Dementia 04/24/2012       Past Surgical History  Procedure Date  . Hernia repair     Allergies:  Allergies  Allergen Reactions  . Penicillins Rash    Current Medications:  Prior to Admission medications   Medication Sig Start Date End Date Taking? Authorizing Provider  acetaminophen-codeine (TYLENOL #3) 300-30 MG per tablet Take 1 tablet by mouth 2 (two) times daily.   Yes Historical Provider, MD  allopurinol (ZYLOPRIM) 300 MG tablet Take 300 mg by mouth 2 (two) times daily.   Yes Historical Provider, MD  gabapentin (NEURONTIN) 600 MG tablet Take 600 mg by mouth 4 (four) times daily.   Yes Historical Provider, MD  glipiZIDE (GLUCOTROL XL) 5 MG 24 hr tablet Take 5 mg by mouth daily.   Yes Historical Provider, MD  lisinopril-hydrochlorothiazide (PRINZIDE,ZESTORETIC) 20-25 MG per tablet Take 1 tablet by mouth daily.   Yes Historical Provider, MD  potassium chloride SA (K-DUR,KLOR-CON) 20 MEQ tablet Take 20 mEq by mouth daily.   Yes  Historical Provider, MD  rosuvastatin (CRESTOR) 40 MG tablet Take 40 mg by mouth at bedtime.   Yes Historical Provider, MD  Vitamin D, Ergocalciferol, (DRISDOL) 50000 UNITS CAPS Take 50,000 Units by mouth once a week.   Yes Historical Provider, MD    Social History:    reports that he has never smoked. He does not have any smokeless tobacco history on file. He reports that he drinks alcohol. He reports that he does not use illicit drugs.   Family History:    Family History  Problem Relation Age of Onset  . Diabetes Mother   . Diabetes Father   . Diabetes Sister   . Hypertension Mother   . Hypertension Father   . Kidney disease Father   . Kidney disease Brother     Mental Status Examination/Evaluation: Objective:  Appearance: Well Groomed  Psychomotor Activity:  Normal  Eye Contact::  Good  Speech:  Clear and Coherent  Volume:  Normal  Mood:  irritable  Affect:  Congruent  Thought Process:  Coherent, Relevant and and yet at times very unrealistic; argumentative  Orientation:  Full  Thought Content:  Paranoia  Suicidal Thoughts:  No  Homicidal Thoughts:  No  Judgement:  Poor  Insight:  Lacking    DIAGNOSIS:   AXIS I   Dementia mild  AXIS II  Deferred  AXIS III See medical notes.  AXIS IV other psychosocial or environmental problems, problems related to social environment and forgetful about the medical treatment plan  AXIS V 45     RECOMMENDATION:   1.  No new med rec now 2. Family is willing to make sure his rx compliance now 3. Will continue supportive therapy 4. Weekend psychiatrist to follow up. tomorrow        ROS   Physical Exam

## 2012-05-05 DIAGNOSIS — N179 Acute kidney failure, unspecified: Secondary | ICD-10-CM

## 2012-05-05 DIAGNOSIS — F0390 Unspecified dementia without behavioral disturbance: Secondary | ICD-10-CM

## 2012-05-05 DIAGNOSIS — R112 Nausea with vomiting, unspecified: Secondary | ICD-10-CM

## 2012-05-05 DIAGNOSIS — E876 Hypokalemia: Secondary | ICD-10-CM

## 2012-05-05 LAB — GLUCOSE, CAPILLARY: Glucose-Capillary: 176 mg/dL — ABNORMAL HIGH (ref 70–99)

## 2012-05-05 LAB — RENAL FUNCTION PANEL
Albumin: 3.7 g/dL (ref 3.5–5.2)
GFR calc Af Amer: 12 mL/min — ABNORMAL LOW (ref >90)
GFR calc non Af Amer: 10 mL/min — ABNORMAL LOW (ref >90)
Glucose, Bld: 146 mg/dL — ABNORMAL HIGH (ref 70–99)
Phosphorus: 1.9 mg/dL — ABNORMAL LOW (ref 2.3–4.6)
Potassium: 3.5 mEq/L (ref 3.5–5.1)
Sodium: 140 mEq/L (ref 135–145)

## 2012-05-05 MED ORDER — RENA-VITE PO TABS
1.0000 | ORAL_TABLET | Freq: Every day | ORAL | Status: DC
  Administered 2012-05-05: 1 via ORAL
  Filled 2012-05-05 (×2): qty 1

## 2012-05-05 NOTE — Progress Notes (Signed)
Subjective: No complaints.  Objective Vital signs in last 24 hours: Filed Vitals:   05/04/12 2131 05/05/12 0437 05/05/12 0940 05/05/12 1400  BP: 131/76 149/88 153/79 117/71  Pulse: 91 93 101 96  Temp: 98.8 F (37.1 C) 98.5 F (36.9 C) 97.8 F (36.6 C) 97.7 F (36.5 C)  TempSrc: Oral Oral Oral Oral  Resp: 16 18 17 16   Height:      Weight: 86.456 kg (190 lb 9.6 oz)     SpO2: 98% 98% 96% 100%   Weight change: 1.756 kg (3 lb 13.9 oz)  Intake/Output Summary (Last 24 hours) at 05/05/12 1619 Last data filed at 05/04/12 1854  Gross per 24 hour  Intake    300 ml  Output      3 ml  Net    297 ml   Labs: Basic Metabolic Panel:  Lab 05/05/12 4098 05/04/12 0552 05/03/12 0630 05/02/12 0849 05/01/12 0816 04/30/12 0500 04/29/12 0603  NA 140 142 143 141 140 140 140  K 3.5 3.3* 3.4* 3.2* 2.9* 3.3* 3.3*  CL 102 104 102 100 100 100 99  CO2 23 25 27 25 26 27 29   GLUCOSE 146* 118* 142* 213* 133* 123* 124*  BUN 46* 43* 39* 31* 60* 53* 42*  CREATININE 5.13* 5.30* 5.20* 4.36* 7.11* 6.89* 6.40*  ALB -- -- -- -- -- -- --  CALCIUM 9.8 9.7 9.9 9.5 9.4 9.2 9.1  PHOS 1.9* 2.6 2.7 -- 4.7* 4.6 3.7   Liver Function Tests:  Lab 05/05/12 0500 05/04/12 1445 05/04/12 0552  AST -- 23 --  ALT -- 20 --  ALKPHOS -- 94 --  BILITOT -- 0.2* --  PROT -- 7.2 --  ALBUMIN 3.7 3.8 3.8   No results found for this basename: LIPASE:3,AMYLASE:3 in the last 168 hours No results found for this basename: AMMONIA:3 in the last 168 hours CBC:  Lab 05/03/12 0630 05/02/12 0849 05/01/12 0815 04/29/12 0603  WBC 5.9 6.0 6.0 5.2  NEUTROABS -- -- -- --  HGB 9.2* 9.0* 7.8* 8.5*  HCT 26.8* 26.2* 22.2* 24.5*  MCV 87.0 87.0 86.0 86.3  PLT 380 386 327 239   PT/INR: @labrcntip (inr:5) Cardiac Enzymes: No results found for this basename: CKTOTAL:5,CKMB:5,CKMBINDEX:5,TROPONINI:5 in the last 168 hours CBG:  Lab 05/05/12 1131 05/05/12 0748 05/04/12 2139 05/04/12 1715 05/04/12 1133  GLUCAP 124* 145* 187* 142* 153*     Iron Studies: No results found for this basename: IRON:30,TIBC:30,TRANSFERRIN:30,FERRITIN:30 in the last 168 hours  Physical Exam:  Blood pressure 117/71, pulse 96, temperature 97.7 F (36.5 C), temperature source Oral, resp. rate 16, height 6\' 2"  (1.88 m), weight 86.456 kg (190 lb 9.6 oz), SpO2 100.00%.  Gen: Comfortably sitting up in his chair-currently in pleasant disposition and engages in good sensible conversation  CVS: Pulse regular in rate and rhythm, heart sounds S1 and S2 normal  Resp: Clear to auscultation bilaterally, no rales/rhonchi  Abd: Soft, flat, nontender and bowel sounds are normal  Ext: No lower extremity edema  Impression/Plan 1. AKI secondary to ATN of unclear etiology : Renal biopsy showed diffuse ATN with some podocyte effacement likely from preceding nonsteroidal anti-inflammatory use. No acute dialysis needs noted today and renal function seems to have started to improve. Scr down today at 5.13.  He understands that this process may take another 2-3 days and is willing to stay around for the duration at this time. Urine output is not quantified as the patient voids in his commode however apparently "adequate" per patient.  2. Anemia: on ESA, no overt blood loss.  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  4. Hypokalemia: We'll supplement this via by oral route. No obvious GI losses noted/reported.  5. Dementia/agitation and limited decision-making capacity: Appreciate input from psychiatry and hospitalist service. Currently, the patient is more placid on scheduled doses of lorazepam  Vinson Moselle  MD [facility] [phone] pgr    [phone] cell 05/05/2012, 4:19 PM

## 2012-05-05 NOTE — Consult Note (Signed)
Patient Identification:  Gary Mcneil Date of Evaluation:  05/05/2012 Reason for Consult:  Pt threatens to leave AMA  Referring Provider: Dr. Janee Morn History of Present Illness:Pt came to ED unconscious with CKD and very abnormal labs.  He has been in HD for nearly 2 wks and had renal biopsy was to be done  Recently. He was threatning to leave AMA recently  Interval Hx:  Was seen alone today. Well dressed and more happy today as he is hoping to be discharged soon but at times thinks it is today but willing to stay as needed. Reports good mood and willing go for out pt care after discharge.  Past Psychiatric History:unknown   Past Medical History:     Past Medical History  Diagnosis Date  . Diabetes mellitus   . DEMENTIA   . Neuromuscular disorder   . Hypertension   . Gout   . Arthritis   . Dementia 04/24/2012       Past Surgical History  Procedure Date  . Hernia repair     Allergies:  Allergies  Allergen Reactions  . Penicillins Rash    Current Medications:  Prior to Admission medications   Medication Sig Start Date End Date Taking? Authorizing Provider  acetaminophen-codeine (TYLENOL #3) 300-30 MG per tablet Take 1 tablet by mouth 2 (two) times daily.   Yes Historical Provider, MD  allopurinol (ZYLOPRIM) 300 MG tablet Take 300 mg by mouth 2 (two) times daily.   Yes Historical Provider, MD  gabapentin (NEURONTIN) 600 MG tablet Take 600 mg by mouth 4 (four) times daily.   Yes Historical Provider, MD  glipiZIDE (GLUCOTROL XL) 5 MG 24 hr tablet Take 5 mg by mouth daily.   Yes Historical Provider, MD  lisinopril-hydrochlorothiazide (PRINZIDE,ZESTORETIC) 20-25 MG per tablet Take 1 tablet by mouth daily.   Yes Historical Provider, MD  potassium chloride SA (K-DUR,KLOR-CON) 20 MEQ tablet Take 20 mEq by mouth daily.   Yes Historical Provider, MD  rosuvastatin (CRESTOR) 40 MG tablet Take 40 mg by mouth at bedtime.   Yes Historical Provider, MD  Vitamin D, Ergocalciferol,  (DRISDOL) 50000 UNITS CAPS Take 50,000 Units by mouth once a week.   Yes Historical Provider, MD    Social History:    reports that he has never smoked. He does not have any smokeless tobacco history on file. He reports that he drinks alcohol. He reports that he does not use illicit drugs.   Family History:    Family History  Problem Relation Age of Onset  . Diabetes Mother   . Diabetes Father   . Diabetes Sister   . Hypertension Mother   . Hypertension Father   . Kidney disease Father   . Kidney disease Brother     Mental Status Examination/Evaluation: Objective:  Appearance: Well Groomed  Psychomotor Activity:  Normal  Eye Contact::  Good  Speech:  Clear and Coherent  Volume:  Normal  Mood:  irritable  Affect:  Congruent  Thought Process:  Coherent, Relevant and and yet at times very unrealistic; argumentative  Orientation:  Full  Thought Content:  Paranoia  Suicidal Thoughts:  No  Homicidal Thoughts:  No  Judgement:  Poor  Insight:  Lacking    DIAGNOSIS:   AXIS I   Dementia mild  AXIS II  Deferred  AXIS III See medical notes.  AXIS IV other psychosocial or environmental problems, problems related to social environment and forgetful about the medical treatment plan  AXIS V 45  RECOMMENDATION:   1.  No new med rec now 2. He is willing to make sure his rx compliance now 3. psychiatry will continue to follow as needed        ROS    Physical Exam

## 2012-05-05 NOTE — Progress Notes (Signed)
Interim Summary  [name] is a 71 year old African American gentleman with history of hypertension diabetes mild dementia gout rule was admitted on 04/13/2012 secondary to acute renal failure and a background of long-standing diabetes hypertension ACE/diuretic and NSAID use at that time with a creatinine of 10.79. ACE inhibitor was discontinued patient was monitored initially placed on IV fluids and and followed. Serologic workup which was done was negative. Urinalysis which was done did have some proteinuria. Per nephrology the patient did have a normal renal function approximately a month prior to admission. A renal consultation was obtained and patient was seen in consultation by Dr. Eliott Nine of nephrology. Patient's renal function worsened and his creatinine went up as high as 18.31. A hemodialysis catheter was subsequently placed and patient has been receiving intermittent hemodialysis. Patient is also been placed on Lasix and has had increasing urine output. His renal function has slowly decreased however not improved significantly. Patient during this hospitalization has had bouts of agitation and threatening to leave AMA. Patient has also been uncooperative and disoriented at times which has limited his care. A psychiatric consultation was obtained patient was seen by Dr. Ferol Luz was felt that patient lacked capacity and it was recommended that the patient decided to leave AMA and IVC be placed. Patient was also started on Namenda for his dementia. IVC has been placed. Patient is status post a renal biopsy on 6/11  which showed findings suggestive of diffuse ATN with some podocyte effacementf .Patient has also been treated for cellulitis during this hospitalization has had a hypoglycemic episode which has since resolved. His blood pressure medications have been titrated for better blood pressure control.     Subjective:   Patient denies complaints. Did not asked to go home today. As per nursing, no  reported agitation overnight.  Objective  Vital signs in last 24 hours: Filed Vitals:   05/04/12 1800 05/04/12 2131 05/05/12 0437 05/05/12 0940  BP: 139/75 131/76 149/88 153/79  Pulse: 100 91 93 101  Temp: 98.8 F (37.1 C) 98.8 F (37.1 C) 98.5 F (36.9 C) 97.8 F (36.6 C)  TempSrc: Oral Oral Oral Oral  Resp: 16 16 18 17   Height:      Weight:  86.456 kg (190 lb 9.6 oz)    SpO2: 97% 98% 98% 96%   Weight change: 1.756 kg (3 lb 13.9 oz)  Intake/Output Summary (Last 24 hours) at 05/05/12 1153 Last data filed at 05/04/12 1854  Gross per 24 hour  Intake    540 ml  Output      3 ml  Net    537 ml    Physical Exam:  General Exam: Comfortable.Has 1:1 sitter. Respiratory System: Clear. No increased work of breathing.  Cardiovascular System: First and second heart sounds heard. Regular rate and rhythm. No JVD/murmurs.  Gastrointestinal System: Abdomen is non distended, soft and normal bowel sounds heard. Nontender. Central Nervous System: Alert and oriented to person and place. No focal neurological deficits. Extremities: Symmetric 5 x 5 power. Psychiatric: Pleasant and cooperative at this time. Denies delusions, or hallucinations or suicidal or homicidal ideations.  Labs:  Basic Metabolic Panel:  Lab 05/05/12 1610 05/04/12 0552 05/03/12 0630  NA 140 142 143  K 3.5 3.3* 3.4*  CL 102 104 102  CO2 23 25 27   GLUCOSE 146* 118* 142*  BUN 46* 43* 39*  CREATININE 5.13* 5.30* 5.20*  CALCIUM 9.8 9.7 9.9  ALB -- -- --  PHOS 1.9* 2.6 2.7   Liver Function  Tests:  Lab 05/05/12 0500 05/04/12 1445 05/04/12 0552  AST -- 23 --  ALT -- 20 --  ALKPHOS -- 94 --  BILITOT -- 0.2* --  PROT -- 7.2 --  ALBUMIN 3.7 3.8 3.8   No results found for this basename: LIPASE:3,AMYLASE:3 in the last 168 hours No results found for this basename: AMMONIA:3 in the last 168 hours CBC:  Lab 05/03/12 0630 05/02/12 0849 05/01/12 0815 04/29/12 0603  WBC 5.9 6.0 6.0 --  NEUTROABS -- -- -- --  HGB  9.2* 9.0* 7.8* --  HCT 26.8* 26.2* 22.2* --  MCV 87.0 87.0 86.0 86.3  PLT 380 386 327 --   Cardiac Enzymes: No results found for this basename: CKTOTAL:5,CKMB:5,CKMBINDEX:5,TROPONINI:5 in the last 168 hours CBG:  Lab 05/05/12 1131 05/05/12 0748 05/04/12 2139 05/04/12 1715 05/04/12 1133  GLUCAP 124* 145* 187* 142* 153*    Iron Studies: No results found for this basename: IRON,TIBC,TRANSFERRIN,FERRITIN in the last 72 hours Studies/Results: No results found. Medications:      . allopurinol  100 mg Oral Daily  . amLODipine  10 mg Oral QHS  . calcium carbonate  2 tablet Oral TID WC  . darbepoetin (ARANESP) injection - DIALYSIS  100 mcg Intravenous Q Wed-HD  . divalproex  500 mg Oral Q12H  . feeding supplement  1 Container Oral Q24H  . gabapentin  300 mg Oral QHS  . lanthanum  1,000 mg Oral TID WC  . LORazepam  2 mg Oral TID  . memantine  10 mg Oral BID  . multivitamin  1 tablet Oral QHS  . potassium chloride  20 mEq Oral TID  . senna  1 tablet Oral QHS  . DISCONTD: multivitamin  1 tablet Oral Daily    I  have reviewed scheduled and prn medications.     Problem/Plan: Principal Problem:  *ARF (acute renal failure) Active Problems:  HTN (hypertension)  Hypoglycemia  DM (diabetes mellitus)  Hyperlipidemia  Cellulitis  Dementia  Thrombocytopenia  Constipation  Hypokalemia  Anemia  Type II diabetes mellitus with hypoglycemia  1. Acute renal failure secondary to ATN of unclear etiology:  nephrology following and management per nephrology. No acute dialysis needs seen at this time. Continue to monitor renal functions with hope for improvement. Creatinine is slightly better today 5.30 > 5.13. 2. Dementia with intermittent agitation: Patient lacks capacity and cannot sign out AMA. He is involuntarily committed. Continue Namenda and scheduled Ativan. Possible sundowning with agitation at night. As per psychiatry recommendation, started Depakote 500 mg by mouth twice a day on  6/15. Monitor LFTs and CBCs closely. 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 He may not have to be on Ativan or Depakote for a long time on discharge. 3. Hypokalemia: Management per nephrology. Repleting by mouth. Improved. 4. Anemia: Stable. No obvious source of bleeding. Continue Aranesp. 5. Hypoglycemia: In patient with type 2 diabetes mellitus. Resolved. 6. Hypertension: Reasonably controlled. Continue Amlodipine. 7. Thrombocytopenia: Resolved. 8. Left foot cellulitis: Improved. Antibiotics discontinued.  Disposition: Discharge when cleared by nephrology.  Ameira Alessandrini 05/05/2012,11:53 AM  LOS: 22 days

## 2012-05-06 DIAGNOSIS — R112 Nausea with vomiting, unspecified: Secondary | ICD-10-CM

## 2012-05-06 DIAGNOSIS — N179 Acute kidney failure, unspecified: Secondary | ICD-10-CM

## 2012-05-06 DIAGNOSIS — E876 Hypokalemia: Secondary | ICD-10-CM

## 2012-05-06 DIAGNOSIS — F0390 Unspecified dementia without behavioral disturbance: Secondary | ICD-10-CM

## 2012-05-06 LAB — RENAL FUNCTION PANEL
BUN: 46 mg/dL — ABNORMAL HIGH (ref 6–23)
CO2: 26 mEq/L (ref 19–32)
Chloride: 104 mEq/L (ref 96–112)
GFR calc Af Amer: 13 mL/min — ABNORMAL LOW (ref >90)
Glucose, Bld: 131 mg/dL — ABNORMAL HIGH (ref 70–99)
Potassium: 3.2 mEq/L — ABNORMAL LOW (ref 3.5–5.1)

## 2012-05-06 LAB — CBC
HCT: 25.8 % — ABNORMAL LOW (ref 39.0–52.0)
Hemoglobin: 8.9 g/dL — ABNORMAL LOW (ref 13.0–17.0)
MCV: 86.9 fL (ref 78.0–100.0)
RBC: 2.97 MIL/uL — ABNORMAL LOW (ref 4.22–5.81)
WBC: 6.4 10*3/uL (ref 4.0–10.5)

## 2012-05-06 LAB — GLUCOSE, CAPILLARY
Glucose-Capillary: 132 mg/dL — ABNORMAL HIGH (ref 70–99)
Glucose-Capillary: 130 mg/dL — ABNORMAL HIGH (ref 70–99)

## 2012-05-06 MED ORDER — GABAPENTIN 600 MG PO TABS: 300.0000 mg | ORAL_TABLET | Freq: Every day | ORAL | Status: DC

## 2012-05-06 MED ORDER — POTASSIUM CHLORIDE CRYS ER 20 MEQ PO TBCR
20.0000 meq | EXTENDED_RELEASE_TABLET | Freq: Once | ORAL | Status: DC
  Filled 2012-05-06: qty 1

## 2012-05-06 MED ORDER — ALLOPURINOL 100 MG PO TABS: 100.0000 mg | ORAL_TABLET | Freq: Every day | ORAL | Status: DC

## 2012-05-06 MED ORDER — ACETAMINOPHEN 325 MG PO TABS: 650.0000 mg | ORAL_TABLET | Freq: Four times a day (QID) | ORAL | Status: AC | PRN

## 2012-05-06 MED ORDER — POTASSIUM CHLORIDE CRYS ER 10 MEQ PO TBCR: 10.0000 meq | EXTENDED_RELEASE_TABLET | Freq: Every day | ORAL | Status: DC

## 2012-05-06 MED ORDER — MEMANTINE HCL 10 MG PO TABS: 10.0000 mg | ORAL_TABLET | Freq: Two times a day (BID) | ORAL | Status: DC

## 2012-05-06 MED ORDER — AMLODIPINE BESYLATE 10 MG PO TABS: 10.0000 mg | ORAL_TABLET | Freq: Every day | ORAL | Status: DC

## 2012-05-06 MED ORDER — CLONAZEPAM 1 MG PO TABS: ORAL_TABLET | ORAL | Status: DC

## 2012-05-06 NOTE — Progress Notes (Addendum)
Progress note:   Pt is sitting in chair, dressed in own clothes.  Dr. Roselee Nova calls to say he plans to discharge pt because his chemistry demonstrates that his ARF has progressively improved and HD is no longer necessary.  He is ready to return home.  Dr. Inquires about the Ativan used to calm his agitation while monitoring his kidney function.  2 mg 2 X daily ==4 mg daily is a high dose to abruptly stop.  Discussion followed with Dr. Roselee Nova to taper and discontinue.   Other concerns concerning his dementia and ability to follow the tx regimen, get to appts is discussed.  Pt says his GF has moved in and will be there to help.  He described a very long term caring relationship.  He agrees she will be very helpful remind him of medication and appts.  RECOMMENDATION:  1. Pt no longer needs IVC. 2. Taper Ativan [BZD] with Klonopin [BZD] 1 mg 2 X daily for 1 wk and 1 mg dialy for second week then discontinue. 3.  Suggest begin Namenda 5 mg BID 4. No further psychiatric needs identified.  [facility] Psychiatrist signs off Cherre Kothari J. Ferol Luz, MD Psychiatrist 05/06/2012 3:41 PM

## 2012-05-06 NOTE — Discharge Summary (Signed)
Discharge Summary  Gary Mcneil MR#: [id number]  DOB:Dec 10, 1940  Date of Admission: 04/13/2012 Date of Discharge: 05/06/2012  Patient's PCP: No primary provider on file.  Attending Physician:Gary Mcneil  Consults: 1. Nephrology: Dr. Zetta Mcneil 2. Psychiatry: Dr. Mickeal Mcneil. 3. Interventional radiology  Discharge Diagnoses: Principal Problem:  *ARF (acute renal failure) Active Problems:  HTN (hypertension)  Hypoglycemia  DM (diabetes mellitus)  Hyperlipidemia  Cellulitis  Dementia  Thrombocytopenia  Constipation  Hypokalemia  Anemia  Type II diabetes mellitus with hypoglycemia  Agitation. Lacks medical decision making capacity.  Brief Admitting History and Physical [name] patient with history of type 2 diabetes mellitus, hypertension on ACE inhibitors and diuretics, gout, alcohol use, dementia was brought to the emergency department on 04/13/12 secondary to altered mental status. EMS was called and found patient's CBG to be 33 mg/dL. He was transferred to the emergency department. In the emergency department patient was in acute renal failure with creatinine of 10.7 and BUN of 60. He also reported taking Aleve for arthritic pain. He was admitted for further evaluation and management.  Discharge Medications Current Discharge Medication List    START taking these medications   Details  acetaminophen (TYLENOL) 325 MG tablet Take 2 tablets (650 mg total) by mouth every 6 (six) hours as needed for pain (or Fever >/= 101).    amLODipine (NORVASC) 10 MG tablet Take 1 tablet (10 mg total) by mouth at bedtime. Qty: 30 tablet, Refills: 0    clonazePAM (KLONOPIN) 1 MG tablet Take 1 tablet twice daily for one week, then 1 tablet once daily for one week, then half tablet once daily for one week, then stop taking. Qty: 25 tablet, Refills: 0    memantine (NAMENDA) 10 MG tablet Take 1 tablet (10 mg total) by mouth 2 (two) times daily. Qty: 60 tablet,  Refills: 0      CONTINUE these medications which have CHANGED   Details  allopurinol (ZYLOPRIM) 100 MG tablet Take 1 tablet (100 mg total) by mouth daily. Qty: 30 tablet, Refills: 0    gabapentin (NEURONTIN) 600 MG tablet Take 0.5 tablets (300 mg total) by mouth at bedtime. Qty: 30 tablet, Refills: 0      CONTINUE these medications which have NOT CHANGED   Details  Vitamin D, Ergocalciferol, (DRISDOL) 50000 UNITS CAPS Take 50,000 Units by mouth once a week.      STOP taking these medications     acetaminophen-codeine (TYLENOL #3) 300-30 MG per tablet Comments:  Reason for Stopping:       glipiZIDE (GLUCOTROL XL) 5 MG 24 hr tablet Comments:  Reason for Stopping:       lisinopril-hydrochlorothiazide (PRINZIDE,ZESTORETIC) 20-25 MG per tablet Comments:  Reason for Stopping:       potassium chloride SA (K-DUR,KLOR-CON) 20 MEQ tablet Comments:  Reason for Stopping:       rosuvastatin (CRESTOR) 40 MG tablet Comments:  Reason for Stopping:          Hospital Course: ARF (acute renal failure) Present on Admission:  .ARF (acute renal failure) .HTN (hypertension) .Hypoglycemia .DM (diabetes mellitus) .Cellulitis .Dementia .Anemia   1. Acute renal failure secondary to ATN of unclear etiology: Patient was admitted to the hospital. ACE inhibitor was discontinued. Patient was treated supportively with IV fluids. Nephrology was consulted. According to nephrology, patient had normal renal functions approximately a month prior to admission. His renal functions worsened and creatinine went up as high as 18.31. A hemodialysis catheter was placed and patient received  intermittent hemodialysis. Renal biopsy was done and showed diffuse ATN with some podocyte effacement likely from preceding NSAID use. He has been off dialysis for the last 4-5 days and his renal functions were closely monitored. His creatinine has finally started to trend down. Nephrology has seen him and cleared him for  discharge after removal of the dialysis catheter. He is to followup with repeat labs in a week from hospital discharge. 2. Dementia with intermittent agitation: During the hospitalization, he had periods of agitation and threatening to leave AGAINST MEDICAL ADVICE. He was also uncooperative and disoriented times which limited his care. Psychiatry was consulted and they determined that patient lacked capacity and patient was hence involuntarily committed. He was started on Namenda for dementia and Ativan scheduled for agitation. Although this comment him some, he still had periods of agitation following which Depakote was added. Discussed with Dr. Ferol Luz today and she recommends changing benzodiazepine to Klonopin taper. She also recommends not discharging her on Depakote. Initially assisted living facility placement was recommended secondary to his lack of capacity and patient lived alone prior to admission. This was discussed at great length with patient's [name] who indicated that patient would be best served returning to his usual home environment. Patient's girlfriend has flexible work hours and will stay with the patient most times. According to his son, patient would not agree to go to an assisted living facility and would want to leave just like he wanted to in the hospital. He would also not agree to staying with his son. Thereby patient will be discharged home to the care of his family. The son has been counseled regarding not to allow patient to drive while on benzodiazepines and not to consume alcohol. He verbalizes understanding. 3. Hypokalemia: Discussed with nephrology who recommended discharging on low-dose potassium supplements and followup of BMP now week. 4. Anemia: Stable. No obvious source of bleeding. Follow CBCs as outpatient.. 5. Hypoglycemia: In patient with type 2 diabetes mellitus. Resolved. Oral hypoglycemic agents have been discontinued. His blood sugars are reasonably  controlled. Once his renal failure is completely resolve, then may consider resuming low-dose oral hypoglycemic agents with close monitoring. 6. Hypertension: Reasonably controlled. Continue Amlodipine. ACE inhibitors and diuretics have been discontinued secondary to acute renal failure. 7. Thrombocytopenia: Resolved. 8. Left foot cellulitis: Has been treated with antibiotics and has resolved. 9. Metabolic acidosis: Secondary to acute renal failure. Resolved. 10. History of gout: Allopurinol dose was reduced secondary to acute renal failure. 11. Peripheral neuropathy: Patient is asymptomatic currently. His gabapentin dose was reduced secondary to acute renal failure. 12. Metabolic bone disease: Discussed with nephrology and resuming home dose of vitamin D.  Day of Discharge  Complaints: Denies complaints and keeps asking to go home.  Physical exam: BP 141/77  Pulse 77  Temp 97.8 F (36.6 C) (Oral)  Resp 20  Ht 6\' 2"  (1.88 m)  Wt 81.149 kg (178 lb 14.4 oz)  BMI 22.97 kg/m2  SpO2 100% General Exam: Comfortable.Has 1:1 sitter.  Respiratory System: Clear. No increased work of breathing.  Cardiovascular System: First and second heart sounds heard. Regular rate and rhythm. No JVD/murmurs.  Gastrointestinal System: Abdomen is non distended, soft and normal bowel sounds heard. Nontender.  Central Nervous System: Alert and oriented to person and place. No focal neurological deficits.  Extremities: Symmetric 5 x 5 power.  Psychiatric: Pleasant and cooperative at this time. Denies delusions, or hallucinations or suicidal or homicidal ideations  Basic Metabolic Panel:  Lab  05/06/12 0635 05/05/12 0500 05/04/12 0552  NA 143 140 142  K 3.2* 3.5 3.3*  CL 104 102 104  CO2 26 23 25   GLUCOSE 131* 146* 118*  BUN 46* 46* 43*  CREATININE 4.78* 5.13* 5.30*  CALCIUM 10.0 9.8 9.7  ALB -- -- --  PHOS 2.4 1.9* 2.6   Liver Function Tests:  Lab 05/06/12 0635 05/05/12 0500 05/04/12 1445  AST -- --  23  ALT -- -- 20  ALKPHOS -- -- 94  BILITOT -- -- 0.2*  PROT -- -- 7.2  ALBUMIN 3.7 3.7 3.8   No results found for this basename: LIPASE:3,AMYLASE:3 in the last 168 hours No results found for this basename: AMMONIA:3 in the last 168 hours CBC:  Lab 05/06/12 0635 05/03/12 0630 05/02/12 0849 05/01/12 0815  WBC 6.4 5.9 6.0 --  NEUTROABS -- -- -- --  HGB 8.9* 9.2* 9.0* --  HCT 25.8* 26.8* 26.2* --  MCV 86.9 87.0 87.0 86.0  PLT 407* 380 386 --   Cardiac Enzymes: No results found for this basename: CKTOTAL:5,CKMB:5,CKMBINDEX:5,TROPONINI:5 in the last 168 hours CBG:  Lab 05/06/12 1140 05/06/12 0733 05/05/12 2054 05/05/12 1632 05/05/12 1131  GLUCAP 130* 132* 176* 170* 124*   Other lab data:  1. Magnesium: 2.1 on 6/5. 2. Total CK on 5/26:4074. 3. Iron studies on 5/30: Iron 42, total iron binding capacity 265. 4. kappa free light chain: 9.68. 5. Serum protein electrophoresis: No M spike detected. Please see complete report for details. 6. ESR on 6/4:100. 7. Haptoglobin: 297. 8. INR: 1.22. 9. A1c: 7. 10. PTH: 466. 11. ANA negative, ASO less than 25, ds DNA Abs: Less than 1, myeloperoxidase antibodies 1, Serine protease: 12, C3 complement: 106, C4 complement: 42 12. Hepatitis B surface antigen, hepatitis B surface antibody and hepatitis B core antibody: All negative 13. Urine analysis on 04/25/12:30 mg/dL protein, trace leukocytes, 3-6 white blood cell, 3-6 red blood cells and rare bacteria. 14. Urine protein electrophoresis: Showed elevated free lambda and kappa light chains. 15. Blood alcohol level: Less than 11  Ct Head Wo Contrast  04/13/2012  *RADIOLOGY REPORT*  Clinical Data: Decreased level of consciousness.  Altered mental status.  Dementia.  CT HEAD WITHOUT CONTRAST  Technique:  Contiguous axial images were obtained from the base of the skull through the vertex without contrast.  Comparison: None.  Findings: There is generalized atrophy.  There is no sign of focal old or acute  infarction.  No mass lesion, hemorrhage, hydrocephalus or extra-axial collection.  There is atherosclerotic change of the major vessels at the base the brain.  The calvarium is unremarkable.  Sinuses are clear.  IMPRESSION: No acute finding.  Generalized atrophy.  Original Report Authenticated By: Thomasenia Sales, M.D.   US Renal  04/13/2012  *RADIOLOGY REPORT*  Clinical Data: Acute renal failure.  Rule out obstruction. Diabetes.  RENAL/URINARY TRACT ULTRASOUND COMPLETE  Comparison:  None.  Findings:  Right Kidney:  Measures 13.3 cm in length.  Echogenicity of the kidney is similar to   the liver.  There is no hydronephrosis.  2.3 x 2.0 x 1.8 cm cyst is seen in the mid pole of the right kidney. 0.9 x 0.7 x 0.9 cm lower pole right renal cyst.  Left Kidney:  Measures 13.0 cm.  Negative for hydronephrosis or evidence of mass.  Echogenicity of the renal cortex is upper normal to slightly increased.  Bladder:  There is normal for degree of distention.  Prostate gland measures approximately 5.7 cm  transverse diameter.  There may be a trace amount of ascites in Morison's pouch.  This is not definite.  IMPRESSION:  1.  Negative for hydronephrosis. Two right renal cysts. 2.  Echogenicity of the kidneys is upper normal to slightly increased. Medical renal disease cannot be excluded. 3.  Prostamegaly. 4.  Question a tiny amount of ascites and between the right kidney and liver.  Original Report Authenticated By: Britta Mccreedy, M.D.   Mr Foot Left Wo Contrast  04/16/2012  *RADIOLOGY REPORT*  Clinical Data: Left foot cellulitis  MRI OF THE LEFT FOREFOOT WITHOUT CONTRAST  Technique:  Multiplanar, multisequence MR imaging was performed. No intravenous contrast was administered.  Comparison: Radiographs dated 04/14/2012  Findings: There is no evidence of osteomyelitis or other acute osseous abnormality.  There is deformity of the distal aspect of the proximal phalangeal bone of the little toe, probably due to prior trauma.   There are what appears to be tiny metallic artifacts at that site which not visible on radiographs. This may be related to the prior trauma.  The patient has extensive subcutaneous edema primarily on the dorsum of the foot and around the ankle circumferentially this is nonspecific.  The tendons and ligaments of the ankle and foot demonstrate no significant abnormalities.  No definable abscesses.  IMPRESSION:  1.  No evidence of osteomyelitis or soft tissue abscess. 2.  Extensive subcutaneous edema, nonspecific.  Original Report Authenticated By: Gwynn Burly, M.D.   Ir Fluoro Guide Cv Line Right  04/18/2012  *RADIOLOGY REPORT*  Clinical history: Acute renal failure, needs access for hemodialysis  RIGHT IJ CATHETER PLACEMENT UNDER ULTRASOUND AND FLUOROSCOPIC GUIDANCE  Technique: The procedure, risks (including but not limited to bleeding, infection, organ damage, pneumothorax), benefits, and alternatives were explained to the patient.  Questions regarding the procedure were encouraged and answered.  The patient understands and consents to the procedure. Patency of the right IJ vein was confirmed with ultrasound with image documentation. An appropriate skin site was determined. Skin site was marked. Region was prepped using maximum barrier technique including cap and mask, sterile gown, sterile gloves, large sterile sheet, and Chlorhexidine as cutaneous antisepsis.  The region was infiltrated locally with 1% lidocaine.  Under real-time ultrasound guidance, the right  IJ vein was accessed with a 19 gauge needle; the needle tip within the vein was confirmed with ultrasound image documentation.   The needle exchanged over a guidewire for vascular dilator which allowed advancement of a 24 cm Trialysis catheter. This was positioned with the tip in the proximal right atrium. Spot chest radiograph shows good positioning and no pneumothorax. Catheter was flushed and sutured externally with 0-Prolene sutures. Patient  tolerated the procedure well, with no immediate complication.  IMPRESSION 1. Technically successful right IJ Trialysis catheter placement.  Original Report Authenticated By: Osa Craver, M.D.    Ir US Guide Bx Asp/drain  04/30/2012  *RADIOLOGY REPORT*  Clinical Data: Diabetes, hypertension, acute renal failure requiring dialysis  ULTRASOUND LEFT KIDNEY LOWER POLE CORTEX CORE BIOPSY  Date:  04/30/2012 11:30:00  Radiologist:  M. Ruel Favors, M.D.  Medications:  1 mg Versed, 25 mcg Fentanyl  Guidance:  Ultrasound  Fluoroscopy time:  None.  Sedation time:  15 minutes  Contrast volume:  None.  Complications:  No immediate  PROCEDURE/FINDINGS:  Informed consent was obtained from the patient following explanation of the procedure, risks, benefits and alternatives. The patient understands, agrees and consents for the procedure. All questions were addressed.  A time  out was performed.  Maximal barrier sterile technique utilized including caps, mask, sterile gowns, sterile gloves, large sterile drape, hand hygiene, and betadine  The patient was positioned prone.  Imaging performed of both kidneys.  No hydronephrosis demonstrated on either side.  Left kidney lower pole cortex was localized.  Under sterile conditions and local anesthesia, a 16 gauge core biopsy was advanced under direct ultrasound guidance to lower pole cortex.  2 16 gauge core biopsies obtained placed in saline.  These biopsies were intact non fragmented.  Pathology review demonstrated adequacy.  Needle removed.  Post procedure imaging demonstrates no evidence of hemorrhage or hematoma.  The patient tolerated the biopsy well.  IMPRESSION: Successful ultrasound left kidney lower pole cortex 16 gauge core biopsy  Original Report Authenticated By: Judie Petit. Ruel Favors, M.D.   Dg Foot 2 Views Left  04/15/2012  *RADIOLOGY REPORT*  Clinical Data: Soft tissue wound to the foot.  Cellulitis.  LEFT FOOT - 2 VIEW  Comparison:  None  Findings: There is  deformity of the proximal and distal phalangeal bones of the little toe, probably due to remote trauma.  Slight dorsal spurring on the talus and cuneiforms.  Slight arthritis of the first metatarsal phalangeal joint with slight bunion formation.  Slight motion degradation on the AP view.  IMPRESSION: No findings of osteomyelitis. Probable old post-traumatic changes of the little toe.  Arthritic changes as described.  Original Report Authenticated By: Gwynn Burly, M.D.     Disposition: Discharged home in stable condition.  Diet: Heart healthy and diabetic.  Activity: Increase activity gradually.   Follow-up Appts: Discharge Orders    Future Orders Please Complete By Expires   Diet - low sodium heart healthy      Diet Carb Modified      Increase activity slowly      Call MD for:  temperature >100.4      Call MD for:  persistant nausea and vomiting      Call MD for:  severe uncontrolled pain      Call MD for:  difficulty breathing, headache or visual disturbances         TESTS THAT NEED FOLLOW-UP CBC and renal panel in one week from hospital discharge, through the nephrologist office.  Time spent on discharge, talking to the patient, and coordinating care: 45 mins.   SignedMarcellus Scott, MD 05/06/2012, 1:21 PM

## 2012-05-06 NOTE — Progress Notes (Addendum)
Subjective: No complaints.  Events noted.  UOP not recorded but creatinine went down. Last HD was 6/12.  Says "home, home, home, home"  Objective Vital signs in last 24 hours: Filed Vitals:   05/05/12 1741 05/05/12 2046 05/06/12 0443 05/06/12 0943  BP: 148/87 179/81 137/81 141/77  Pulse: 92 99 80 77  Temp: 98 F (36.7 C) 98 F (36.7 C) 97.9 F (36.6 C) 97.8 F (36.6 C)  TempSrc: Oral Oral Oral Oral  Resp: 17 18 16 20   Height:      Weight:  81.149 kg (178 lb 14.4 oz)    SpO2: 97% 100% 100% 100%   Weight change: -5.307 kg (-11 lb 11.2 oz)  Intake/Output Summary (Last 24 hours) at 05/06/12 1200 Last data filed at 05/06/12 0909  Gross per 24 hour  Intake    720 ml  Output      0 ml  Net    720 ml   Labs: Basic Metabolic Panel:  Lab 05/06/12 1610 05/05/12 0500 05/04/12 0552 05/03/12 0630 05/02/12 0849 05/01/12 0816 04/30/12 0500  NA 143 140 142 143 141 140 140  K 3.2* 3.5 3.3* 3.4* 3.2* 2.9* 3.3*  CL 104 102 104 102 100 100 100  CO2 26 23 25 27 25 26 27   GLUCOSE 131* 146* 118* 142* 213* 133* 123*  BUN 46* 46* 43* 39* 31* 60* 53*  CREATININE 4.78* 5.13* 5.30* 5.20* 4.36* 7.11* 6.89*  ALB -- -- -- -- -- -- --  CALCIUM 10.0 9.8 9.7 9.9 9.5 9.4 9.2  PHOS 2.4 1.9* 2.6 2.7 -- 4.7* 4.6   Liver Function Tests:  Lab 05/06/12 0635 05/05/12 0500 05/04/12 1445  AST -- -- 23  ALT -- -- 20  ALKPHOS -- -- 94  BILITOT -- -- 0.2*  PROT -- -- 7.2  ALBUMIN 3.7 3.7 3.8   No results found for this basename: LIPASE:3,AMYLASE:3 in the last 168 hours No results found for this basename: AMMONIA:3 in the last 168 hours CBC:  Lab 05/06/12 0635 05/03/12 0630 05/02/12 0849 05/01/12 0815  WBC 6.4 5.9 6.0 6.0  NEUTROABS -- -- -- --  HGB 8.9* 9.2* 9.0* 7.8*  HCT 25.8* 26.8* 26.2* 22.2*  MCV 86.9 87.0 87.0 86.0  PLT 407* 380 386 327   PT/INR: @labrcntip (inr:5) Cardiac Enzymes: No results found for this basename: CKTOTAL:5,CKMB:5,CKMBINDEX:5,TROPONINI:5 in the last 168 hours CBG:  Lab  05/06/12 0733 05/05/12 2054 05/05/12 1632 05/05/12 1131 05/05/12 0748  GLUCAP 132* 176* 170* 124* 145*    Iron Studies: No results found for this basename: IRON:30,TIBC:30,TRANSFERRIN:30,FERRITIN:30 in the last 168 hours  Physical Exam:  Blood pressure 141/77, pulse 77, temperature 97.8 F (36.6 C), temperature source Oral, resp. rate 20, height 6\' 2"  (1.88 m), weight 81.149 kg (178 lb 14.4 oz), SpO2 100.00%.  Gen: Comfortably sitting up in his chair-currently in pleasant disposition and engages in good sensible conversation  CVS: Pulse regular in rate and rhythm, heart sounds S1 and S2 normal  Resp: Clear to auscultation bilaterally, no rales/rhonchi  Abd: Soft, flat, nontender and bowel sounds are normal  Ext: No lower extremity edema  Impression/Plan 1. AKI secondary to ATN of unclear etiology : Renal biopsy showed diffuse ATN with some podocyte effacement likely from preceding nonsteroidal anti-inflammatory use. No acute dialysis needs noted today and renal function seems to have started to improve. Scr down today under 5.  He wants to go home and gets combative at times.  I think would be OK to discontinue  vascath and discharge him with follow up.  The office is going to get back with me with appts, as soon as I know I will put in system.   Will get follow up labs in one week thru our office and follow up with Dr. Allena Katz at [facility] July 17 at 3:30 PM  3. 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  Does not need renal vitamin either at discharge.  4. Hypokalemia: We'll supplement this via by oral route. No obvious GI losses noted/reported.  5. Dementia/agitation and limited decision-making capacity: Appreciate input from psychiatry and hospitalist service. Currently, the patient is more placid on scheduled doses of lorazepam.   Alexzandria Massman A   [facility]

## 2013-02-11 ENCOUNTER — Other Ambulatory Visit: Payer: Self-pay | Admitting: Geriatric Medicine

## 2013-02-11 MED ORDER — MEMANTINE HCL 10 MG PO TABS: 10.0000 mg | ORAL_TABLET | Freq: Two times a day (BID) | ORAL | Status: DC

## 2013-04-09 ENCOUNTER — Other Ambulatory Visit: Payer: Self-pay | Admitting: Internal Medicine

## 2013-05-06 ENCOUNTER — Encounter: Payer: Self-pay | Admitting: *Deleted

## 2013-05-07 ENCOUNTER — Ambulatory Visit: Payer: Self-pay | Admitting: Internal Medicine

## 2013-05-07 DIAGNOSIS — Z0289 Encounter for other administrative examinations: Secondary | ICD-10-CM

## 2013-05-09 ENCOUNTER — Encounter: Payer: Self-pay | Admitting: *Deleted

## 2013-05-13 ENCOUNTER — Other Ambulatory Visit: Payer: Self-pay | Admitting: Internal Medicine

## 2013-05-13 ENCOUNTER — Ambulatory Visit: Payer: Self-pay | Admitting: Internal Medicine

## 2013-05-13 DIAGNOSIS — Z0289 Encounter for other administrative examinations: Secondary | ICD-10-CM

## 2013-06-23 ENCOUNTER — Other Ambulatory Visit: Payer: Self-pay | Admitting: Internal Medicine

## 2013-07-11 ENCOUNTER — Other Ambulatory Visit: Payer: Self-pay | Admitting: Internal Medicine

## 2013-07-22 ENCOUNTER — Other Ambulatory Visit: Payer: Self-pay | Admitting: Nurse Practitioner

## 2013-08-15 ENCOUNTER — Other Ambulatory Visit: Payer: Self-pay | Admitting: Nurse Practitioner

## 2013-08-15 NOTE — Telephone Encounter (Signed)
Schedule follow-up, 2 no shows

## 2013-09-15 ENCOUNTER — Other Ambulatory Visit: Payer: Self-pay | Admitting: Internal Medicine

## 2013-09-16 ENCOUNTER — Ambulatory Visit: Payer: Medicare Other | Admitting: Internal Medicine

## 2013-10-17 ENCOUNTER — Other Ambulatory Visit: Payer: Self-pay | Admitting: Internal Medicine

## 2013-11-27 ENCOUNTER — Telehealth: Payer: Self-pay | Admitting: *Deleted

## 2013-11-27 NOTE — Telephone Encounter (Signed)
Spoke with care giver of the patient and she explains that the patient has alzheimer's and will not agree to come to the doctor's for a check-up. I spoke to the patient and he states that he doesn't need to come see Dr. Glade LloydPandey for anything right now. I tried to encourage him to come and get a flu vaccine and he also refused that as well. I explained to him that he would stay more healthy if he was to get his flu vaccine, he states that he didn't need one. SRice RMA.

## 2013-12-12 ENCOUNTER — Other Ambulatory Visit: Payer: Self-pay | Admitting: Internal Medicine

## 2013-12-16 ENCOUNTER — Telehealth: Payer: Self-pay | Admitting: *Deleted

## 2013-12-16 NOTE — Telephone Encounter (Signed)
Son came into the office and stated that he needed a letter for long term disability treatment coverage due to the sudden death of patient's wife who always gave him his medications. Son stated that his dad could not care for himself and needs this ASAP. Patient has not been seen since 11/15/2012 and No showed to 3 appointments. I told the son that patient needed an appointment before this letter could be written. Appointment made for 12/18/2013 with Candelaria CelesteJessica Karam. Son Agreed.

## 2013-12-18 ENCOUNTER — Ambulatory Visit (INDEPENDENT_AMBULATORY_CARE_PROVIDER_SITE_OTHER): Payer: Medicare Other | Admitting: Nurse Practitioner

## 2013-12-18 ENCOUNTER — Encounter: Payer: Self-pay | Admitting: Nurse Practitioner

## 2013-12-18 VITALS — BP 150/89 | HR 96 | Temp 98.8°F | Resp 10 | Wt 187.0 lb

## 2013-12-18 DIAGNOSIS — F0390 Unspecified dementia without behavioral disturbance: Secondary | ICD-10-CM

## 2013-12-18 DIAGNOSIS — D649 Anemia, unspecified: Secondary | ICD-10-CM

## 2013-12-18 DIAGNOSIS — E119 Type 2 diabetes mellitus without complications: Secondary | ICD-10-CM

## 2013-12-18 DIAGNOSIS — L03211 Cellulitis of face: Secondary | ICD-10-CM

## 2013-12-18 DIAGNOSIS — I10 Essential (primary) hypertension: Secondary | ICD-10-CM

## 2013-12-18 DIAGNOSIS — L0201 Cutaneous abscess of face: Secondary | ICD-10-CM

## 2013-12-18 MED ORDER — DOXYCYCLINE HYCLATE 100 MG PO CAPS: 100.0000 mg | ORAL_CAPSULE | Freq: Two times a day (BID) | ORAL | Status: DC

## 2013-12-18 NOTE — Progress Notes (Signed)
Passed clock drawing 

## 2013-12-18 NOTE — Progress Notes (Signed)
Patient ID: Gary DadaKenneth E Mcneil, male   DOB: 03/28/1941, 73 y.o.   MRN: [id number]    Allergies  Allergen Reactions  . Penicillins Rash    Chief Complaint  Patient presents with  . Mass    Right side of face x 3-4 days, with drainage and pain   . Memory Loss    Ongoing memory concerns   . Headache    Discuss rx for Tylenol #e for headaches off/on     HPI: Patient is a 73 y.o. male seen in the office today for routine follow up; gets boils frequently but has had a large boil on his face that has become quite large and now it is red tender and draining, no fevers or chills Family concerned about pts memory; frequently forgets Was previously living with friend who died, has missed several follow up appts, does not take medication, question if he has even been eating right -- does not cook  Review of Systems:  Review of Systems  Constitutional: Negative for fever, chills and malaise/fatigue.  HENT: Negative for congestion.   Eyes: Negative for blurred vision.  Respiratory: Negative for cough.   Cardiovascular: Negative for chest pain.  Gastrointestinal: Negative for heartburn, abdominal pain, diarrhea and constipation.  Genitourinary: Negative for dysuria, urgency and frequency.  Musculoskeletal: Negative for myalgias.  Skin: Negative for itching and rash.       Frequent boils  Neurological: Negative for dizziness, weakness and headaches.  Psychiatric/Behavioral: Positive for memory loss. Negative for depression. The patient is not nervous/anxious and does not have insomnia.      Past Medical History  Diagnosis Date  . Type II or unspecified type diabetes mellitus without mention of complication, uncontrolled   . DEMENTIA   . Neuromuscular disorder   . Hypertension   . Gout   . Arthritis   . Dementia 04/24/2012  . Unspecified hereditary and idiopathic peripheral neuropathy   . Anemia, unspecified   . Hypotension, unspecified   . Acute kidney failure, unspecified   . Unspecified  vitamin D deficiency   . Other and unspecified hyperlipidemia   . Hypopotassemia   . Disorder of bone and cartilage, unspecified   . Memory loss   . Hypotension, unspecified   . Acute kidney failure, unspecified   . Muscle weakness (generalized)   . Dizziness and giddiness   . Myalgia and myositis, unspecified   . Screening for ischemic heart disease   . Type II or unspecified type diabetes mellitus without mention of complication, uncontrolled   . Gout, unspecified   . Tobacco use disorder   . Memory loss   . Special screening for malignant neoplasm of prostate    Past Surgical History  Procedure Laterality Date  . Inguinal hernia repair     Social History:   reports that he has never smoked. He does not have any smokeless tobacco history on file. He reports that he drinks alcohol. He reports that he does not use illicit drugs.  Family History  Problem Relation Age of Onset  . Diabetes Mother   . Hypertension Mother   . Diabetes Father   . Hypertension Father   . Kidney disease Father   . Diabetes Sister   . Kidney disease Brother     Medications: Patient's Medications  New Prescriptions   No medications on file  Previous Medications   ACETAMINOPHEN (TYLENOL) 325 MG TABLET    Take 650 mg by mouth every 6 (six) hours as needed for pain. Take  two tablets every 6 hours as needed for pain   ALLOPURINOL (ZYLOPRIM) 100 MG TABLET    TAKE 1 TABLET BY MOUTH EVERY DAY   AMLODIPINE (NORVASC) 10 MG TABLET    Take 1 tablet by mouth daily. Need appointment before next refill.   ASPIRIN 81 MG TABLET    Take 81 mg by mouth daily. Take one tablet once a day   GABAPENTIN (NEURONTIN) 300 MG CAPSULE    Take 300 mg by mouth 3 (three) times daily. Take 1 tablet twice a day for nerve pain   GLIPIZIDE (GLUCOTROL XL) 5 MG 24 HR TABLET    Take 5 mg by mouth daily. Take one tablet once a day to control diabetes   MEMANTINE (NAMENDA) 10 MG TABLET    Take 1 tablet (10 mg total) by mouth 2 (two)  times daily.   POTASSIUM CHLORIDE (K-DUR) 10 MEQ TABLET    Take 10 mEq by mouth 2 (two) times daily. Take one tablet twice a day   VITAMIN D, ERGOCALCIFEROL, (DRISDOL) 50000 UNITS CAPS CAPSULE    TAKE ONE CAPSULE BY MOUTH ONCE A WEEK  Modified Medications   No medications on file  Discontinued Medications   VITAMIN D, ERGOCALCIFEROL, (DRISDOL) 50000 UNITS CAPS CAPSULE    TAKE ONE CAPSULE BY MOUTH ONCE A WEEK     Physical Exam:  Filed Vitals:   12/18/13 1558  BP: 150/89  Pulse: 96  Temp: 98.8 F (37.1 C)  TempSrc: Oral  Resp: 10  Weight: 187 lb (84.823 kg)  SpO2: 99%    Physical Exam  Constitutional: He is well-developed, well-nourished, and in no distress.  HENT:  Mouth/Throat: Oropharynx is clear and moist. No oropharyngeal exudate.  Eyes: Conjunctivae and EOM are normal. Pupils are equal, round, and reactive to light.  Neck: Normal range of motion. Neck supple.  Cardiovascular: Normal rate, regular rhythm and normal heart sounds.   Pulmonary/Chest: Effort normal and breath sounds normal. No respiratory distress.  Abdominal: Soft. Bowel sounds are normal. He exhibits no distension.  Musculoskeletal: He exhibits no edema and no tenderness.  Neurological: He is alert.  Skin: Skin is warm and dry.  Large red boil on left cheek with drainage   Psychiatric: Affect normal.     Assessment/Plan 1. Dementia -mild memory impairment on MMSE however family feels like this is much worse -discussed assisted living; note provided for additional care  -question if pt should be driving -discussed with family regarding medication management -stressed importance of safety   2. DM (diabetes mellitus) -was not taking medication however recently refilled glipizide- ? If this is needed; will get A1c - Hemoglobin A1c  3. HTN (hypertension) -stable on norvasc - Comprehensive metabolic panel  4. Anemia - CBC With differential/Platelet  5. Facial abscess -appt made with dermatology  for drainage before he left the office for am - doxycycline (VIBRAMYCIN) 100 MG capsule; Take 1 capsule (100 mg total) by mouth 2 (two) times daily.  Dispense: 20 capsule; Refill: 0  Will get labs and follow up in 6 months; if needed to follow up sooner

## 2013-12-19 ENCOUNTER — Other Ambulatory Visit: Payer: Self-pay

## 2013-12-19 ENCOUNTER — Other Ambulatory Visit: Payer: Self-pay | Admitting: Nurse Practitioner

## 2013-12-19 DIAGNOSIS — E876 Hypokalemia: Secondary | ICD-10-CM

## 2013-12-19 LAB — HEMOGLOBIN A1C
Est. average glucose Bld gHb Est-mCnc: 143 mg/dL
HEMOGLOBIN A1C: 6.6 % — AB (ref 4.8–5.6)

## 2013-12-19 LAB — CBC WITH DIFFERENTIAL
BASOS: 1 %
Basophils Absolute: 0 10*3/uL (ref 0.0–0.2)
EOS: 1 %
Eosinophils Absolute: 0.1 10*3/uL (ref 0.0–0.4)
HCT: 35.5 % — ABNORMAL LOW (ref 37.5–51.0)
HEMOGLOBIN: 12.7 g/dL (ref 12.6–17.7)
IMMATURE GRANS (ABS): 0 10*3/uL (ref 0.0–0.1)
IMMATURE GRANULOCYTES: 0 %
Lymphocytes Absolute: 1.4 10*3/uL (ref 0.7–3.1)
Lymphs: 33 %
MCH: 32.4 pg (ref 26.6–33.0)
MCHC: 35.8 g/dL — ABNORMAL HIGH (ref 31.5–35.7)
MCV: 91 fL (ref 79–97)
MONOCYTES: 8 %
Monocytes Absolute: 0.3 10*3/uL (ref 0.1–0.9)
NEUTROS PCT: 57 %
Neutrophils Absolute: 2.4 10*3/uL (ref 1.4–7.0)
PLATELETS: 150 10*3/uL (ref 150–379)
RBC: 3.92 x10E6/uL — AB (ref 4.14–5.80)
RDW: 15.6 % — ABNORMAL HIGH (ref 12.3–15.4)
WBC: 4.2 10*3/uL (ref 3.4–10.8)

## 2013-12-19 LAB — COMPREHENSIVE METABOLIC PANEL
A/G RATIO: 2 (ref 1.1–2.5)
ALBUMIN: 4.7 g/dL (ref 3.5–4.8)
ALT: 14 IU/L (ref 0–44)
AST: 27 IU/L (ref 0–40)
Alkaline Phosphatase: 65 IU/L (ref 39–117)
BUN/Creatinine Ratio: 13 (ref 10–22)
BUN: 13 mg/dL (ref 8–27)
CALCIUM: 9.2 mg/dL (ref 8.6–10.2)
CO2: 24 mmol/L (ref 18–29)
CREATININE: 1.04 mg/dL (ref 0.76–1.27)
Chloride: 99 mmol/L (ref 97–108)
GFR calc Af Amer: 84 mL/min/{1.73_m2} (ref >59)
GFR, EST NON AFRICAN AMERICAN: 72 mL/min/{1.73_m2} (ref >59)
GLOBULIN, TOTAL: 2.4 g/dL (ref 1.5–4.5)
Glucose: 83 mg/dL (ref 65–99)
Potassium: 2.7 mmol/L — ABNORMAL LOW (ref 3.5–5.2)
Sodium: 144 mmol/L (ref 134–144)
TOTAL PROTEIN: 7.1 g/dL (ref 6.0–8.5)
Total Bilirubin: 0.5 mg/dL (ref 0.0–1.2)

## 2013-12-19 MED ORDER — POTASSIUM CHLORIDE CRYS ER 20 MEQ PO TBCR: 20.0000 meq | EXTENDED_RELEASE_TABLET | Freq: Two times a day (BID) | ORAL | Status: DC

## 2013-12-19 NOTE — Telephone Encounter (Signed)
Clydie BraunKaren called indicating that patient does not have an active RX for Potassium. I informed Clydie BraunKaren I would speak with Shanda BumpsJessica and call her back.  I called Shanda BumpsJessica and per Shanda BumpsJessica call in 20 meq rx, patient to take 2 by mouth now and 2 by mouth this evening, then 1 by mouth daily until further advised.  I called Clydie BraunKaren back and informed her of Jessica's instructions, rx sent in electronically.

## 2013-12-21 ENCOUNTER — Other Ambulatory Visit: Payer: Self-pay | Admitting: Internal Medicine

## 2013-12-22 ENCOUNTER — Other Ambulatory Visit: Payer: Medicare Other

## 2013-12-22 DIAGNOSIS — E876 Hypokalemia: Secondary | ICD-10-CM

## 2013-12-23 LAB — BASIC METABOLIC PANEL
BUN / CREAT RATIO: 17 (ref 10–22)
BUN: 17 mg/dL (ref 8–27)
CO2: 25 mmol/L (ref 18–29)
CREATININE: 1.01 mg/dL (ref 0.76–1.27)
Calcium: 9.5 mg/dL (ref 8.6–10.2)
Chloride: 101 mmol/L (ref 97–108)
GFR, EST AFRICAN AMERICAN: 87 mL/min/{1.73_m2} (ref >59)
GFR, EST NON AFRICAN AMERICAN: 75 mL/min/{1.73_m2} (ref >59)
Glucose: 105 mg/dL — ABNORMAL HIGH (ref 65–99)
Potassium: 3.7 mmol/L (ref 3.5–5.2)
Sodium: 143 mmol/L (ref 134–144)

## 2013-12-31 ENCOUNTER — Encounter: Payer: Self-pay | Admitting: *Deleted

## 2014-01-01 ENCOUNTER — Telehealth: Payer: Self-pay | Admitting: *Deleted

## 2014-01-01 ENCOUNTER — Encounter: Payer: Self-pay | Admitting: *Deleted

## 2014-01-01 NOTE — Telephone Encounter (Signed)
Letter re-typed and order for home health/home health aid & RN for medication administration faxed to [phone].  Called [facility] they received the fax and state that should be all that is needed to get home health started.

## 2014-01-02 DIAGNOSIS — F0390 Unspecified dementia without behavioral disturbance: Secondary | ICD-10-CM

## 2014-01-02 DIAGNOSIS — D649 Anemia, unspecified: Secondary | ICD-10-CM

## 2014-01-02 DIAGNOSIS — E119 Type 2 diabetes mellitus without complications: Secondary | ICD-10-CM

## 2014-01-02 DIAGNOSIS — I10 Essential (primary) hypertension: Secondary | ICD-10-CM

## 2014-01-14 ENCOUNTER — Other Ambulatory Visit: Payer: Self-pay | Admitting: *Deleted

## 2014-01-14 MED ORDER — GLIPIZIDE ER 5 MG PO TB24: ORAL_TABLET | ORAL | Status: DC

## 2014-01-14 MED ORDER — GABAPENTIN 300 MG PO CAPS: ORAL_CAPSULE | ORAL | Status: DC

## 2014-01-16 ENCOUNTER — Other Ambulatory Visit: Payer: Self-pay | Admitting: Internal Medicine

## 2014-01-16 NOTE — Progress Notes (Signed)
Received a call from [facility] home [facility] nurse.  Pt has refused to allow her into his home for the second time.  She attempted a visit last week and he refused then, as well.  His son is filling his mediplanner, but pt not always taking the meds.  It sounds like he is still driving.  The home health nurse did send a note to her social worker at [facility] to ask for further assistance.  Pt's son was made aware that they are discontinuing services b/c pt would not allow them into his home.

## 2014-01-27 ENCOUNTER — Telehealth: Payer: Self-pay | Admitting: *Deleted

## 2014-01-27 ENCOUNTER — Encounter (HOSPITAL_COMMUNITY): Payer: Self-pay | Admitting: Emergency Medicine

## 2014-01-27 ENCOUNTER — Observation Stay (HOSPITAL_COMMUNITY)
Admission: EM | Admit: 2014-01-27 | Discharge: 2014-01-28 | Disposition: A | Discharge status: Home / Self Care | Payer: Medicare Other | Attending: Internal Medicine | Admitting: Internal Medicine

## 2014-01-27 ENCOUNTER — Observation Stay (HOSPITAL_COMMUNITY): Payer: Medicare Other

## 2014-01-27 DIAGNOSIS — E1169 Type 2 diabetes mellitus with other specified complication: Principal | ICD-10-CM | POA: Insufficient documentation

## 2014-01-27 DIAGNOSIS — E876 Hypokalemia: Secondary | ICD-10-CM

## 2014-01-27 DIAGNOSIS — E162 Hypoglycemia, unspecified: Secondary | ICD-10-CM

## 2014-01-27 DIAGNOSIS — I10 Essential (primary) hypertension: Secondary | ICD-10-CM

## 2014-01-27 DIAGNOSIS — Z8639 Personal history of other endocrine, nutritional and metabolic disease: Secondary | ICD-10-CM | POA: Insufficient documentation

## 2014-01-27 DIAGNOSIS — N179 Acute kidney failure, unspecified: Secondary | ICD-10-CM

## 2014-01-27 DIAGNOSIS — R748 Abnormal levels of other serum enzymes: Secondary | ICD-10-CM

## 2014-01-27 DIAGNOSIS — E43 Unspecified severe protein-calorie malnutrition: Secondary | ICD-10-CM

## 2014-01-27 DIAGNOSIS — K5900 Constipation, unspecified: Secondary | ICD-10-CM

## 2014-01-27 DIAGNOSIS — E785 Hyperlipidemia, unspecified: Secondary | ICD-10-CM

## 2014-01-27 DIAGNOSIS — G609 Hereditary and idiopathic neuropathy, unspecified: Secondary | ICD-10-CM | POA: Insufficient documentation

## 2014-01-27 DIAGNOSIS — L0390 Cellulitis, unspecified: Secondary | ICD-10-CM

## 2014-01-27 DIAGNOSIS — Z862 Personal history of diseases of the blood and blood-forming organs and certain disorders involving the immune mechanism: Secondary | ICD-10-CM | POA: Insufficient documentation

## 2014-01-27 DIAGNOSIS — K7689 Other specified diseases of liver: Secondary | ICD-10-CM | POA: Insufficient documentation

## 2014-01-27 DIAGNOSIS — D696 Thrombocytopenia, unspecified: Secondary | ICD-10-CM

## 2014-01-27 DIAGNOSIS — M1990 Unspecified osteoarthritis, unspecified site: Secondary | ICD-10-CM

## 2014-01-27 DIAGNOSIS — E119 Type 2 diabetes mellitus without complications: Secondary | ICD-10-CM

## 2014-01-27 DIAGNOSIS — D649 Anemia, unspecified: Secondary | ICD-10-CM

## 2014-01-27 DIAGNOSIS — K838 Other specified diseases of biliary tract: Secondary | ICD-10-CM | POA: Insufficient documentation

## 2014-01-27 DIAGNOSIS — Z79899 Other long term (current) drug therapy: Secondary | ICD-10-CM | POA: Insufficient documentation

## 2014-01-27 DIAGNOSIS — E11649 Type 2 diabetes mellitus with hypoglycemia without coma: Secondary | ICD-10-CM

## 2014-01-27 DIAGNOSIS — F0390 Unspecified dementia without behavioral disturbance: Secondary | ICD-10-CM

## 2014-01-27 LAB — GLUCOSE, CAPILLARY
GLUCOSE-CAPILLARY: 45 mg/dL — AB (ref 70–99)
GLUCOSE-CAPILLARY: 89 mg/dL (ref 70–99)
GLUCOSE-CAPILLARY: 83 mg/dL (ref 70–99)
Glucose-Capillary: 112 mg/dL — ABNORMAL HIGH (ref 70–99)
Glucose-Capillary: 60 mg/dL — ABNORMAL LOW (ref 70–99)
Glucose-Capillary: 80 mg/dL (ref 70–99)

## 2014-01-27 LAB — COMPREHENSIVE METABOLIC PANEL
ALBUMIN: 4 g/dL (ref 3.5–5.2)
ALK PHOS: 92 U/L (ref 39–117)
ALT: 122 U/L — ABNORMAL HIGH (ref 0–53)
AST: 200 U/L — ABNORMAL HIGH (ref 0–37)
BUN: 15 mg/dL (ref 6–23)
CALCIUM: 8.8 mg/dL (ref 8.4–10.5)
CO2: 19 mEq/L (ref 19–32)
Chloride: 105 mEq/L (ref 96–112)
Creatinine, Ser: 0.66 mg/dL (ref 0.50–1.35)
GFR calc non Af Amer: >90 mL/min (ref >90)
Glucose, Bld: 35 mg/dL — CL (ref 70–99)
POTASSIUM: 3.6 meq/L — AB (ref 3.7–5.3)
SODIUM: 142 meq/L (ref 137–147)
TOTAL PROTEIN: 7 g/dL (ref 6.0–8.3)
Total Bilirubin: 0.7 mg/dL (ref 0.3–1.2)

## 2014-01-27 LAB — URINALYSIS, ROUTINE W REFLEX MICROSCOPIC
Bilirubin Urine: NEGATIVE
GLUCOSE, UA: 100 mg/dL — AB
KETONES UR: NEGATIVE mg/dL
LEUKOCYTES UA: NEGATIVE
NITRITE: NEGATIVE
PH: 7 (ref 5.0–8.0)
Protein, ur: 30 mg/dL — AB
SPECIFIC GRAVITY, URINE: 1.013 (ref 1.005–1.030)
Urobilinogen, UA: 0.2 mg/dL (ref 0.0–1.0)

## 2014-01-27 LAB — CBG MONITORING, ED
GLUCOSE-CAPILLARY: 39 mg/dL — AB (ref 70–99)
Glucose-Capillary: 90 mg/dL (ref 70–99)
Glucose-Capillary: 81 mg/dL (ref 70–99)

## 2014-01-27 LAB — CBC
HCT: 38.3 % — ABNORMAL LOW (ref 39.0–52.0)
Hemoglobin: 14.1 g/dL (ref 13.0–17.0)
MCH: 32.3 pg (ref 26.0–34.0)
MCHC: 36.8 g/dL — AB (ref 30.0–36.0)
MCV: 87.8 fL (ref 78.0–100.0)
Platelets: 154 10*3/uL (ref 150–400)
RBC: 4.36 MIL/uL (ref 4.22–5.81)
RDW: 14.4 % (ref 11.5–15.5)
WBC: 7.1 10*3/uL (ref 4.0–10.5)

## 2014-01-27 LAB — URINE MICROSCOPIC-ADD ON

## 2014-01-27 MED ORDER — TRAMADOL HCL 50 MG PO TABS
25.0000 mg | ORAL_TABLET | Freq: Four times a day (QID) | ORAL | Status: DC | PRN
  Administered 2014-01-27: 25 mg via ORAL
  Filled 2014-01-27: qty 1

## 2014-01-27 MED ORDER — ASPIRIN 81 MG PO TABS: 81.0000 mg | ORAL_TABLET | Freq: Every day | ORAL | Status: DC

## 2014-01-27 MED ORDER — KCL IN DEXTROSE-NACL 20-5-0.45 MEQ/L-%-% IV SOLN
INTRAVENOUS | Status: DC
  Administered 2014-01-27: 100 mL via INTRAVENOUS
  Filled 2014-01-27 (×3): qty 1000

## 2014-01-27 MED ORDER — HALOPERIDOL LACTATE 5 MG/ML IJ SOLN
INTRAMUSCULAR | Status: AC
  Administered 2014-01-27: 2 mg via INTRAVENOUS
  Filled 2014-01-27: qty 1

## 2014-01-27 MED ORDER — DEXTROSE 50 % IV SOLN
1.0000 | Freq: Once | INTRAVENOUS | Status: AC
  Administered 2014-01-27: 50 mL via INTRAVENOUS
  Filled 2014-01-27: qty 50

## 2014-01-27 MED ORDER — HEPARIN SODIUM (PORCINE) 5000 UNIT/ML IJ SOLN
5000.0000 [IU] | Freq: Three times a day (TID) | INTRAMUSCULAR | Status: DC
  Administered 2014-01-27: 5000 [IU] via SUBCUTANEOUS
  Filled 2014-01-27 (×6): qty 1

## 2014-01-27 MED ORDER — ENSURE COMPLETE PO LIQD
237.0000 mL | Freq: Two times a day (BID) | ORAL | Status: DC
  Administered 2014-01-27 – 2014-01-28 (×3): 237 mL via ORAL

## 2014-01-27 MED ORDER — MEMANTINE HCL 10 MG PO TABS
10.0000 mg | ORAL_TABLET | Freq: Two times a day (BID) | ORAL | Status: DC
  Administered 2014-01-27 – 2014-01-28 (×2): 10 mg via ORAL
  Filled 2014-01-27 (×4): qty 1

## 2014-01-27 MED ORDER — AMLODIPINE BESYLATE 10 MG PO TABS: 10.0000 mg | ORAL_TABLET | Freq: Every day | ORAL | Status: DC

## 2014-01-27 MED ORDER — ENSURE PUDDING PO PUDG
1.0000 | Freq: Three times a day (TID) | ORAL | Status: DC
Start: 2014-01-27 — End: 2014-01-27
  Filled 2014-01-27 (×2): qty 1

## 2014-01-27 MED ORDER — HALOPERIDOL LACTATE 5 MG/ML IJ SOLN: 2.0000 mg | Freq: Four times a day (QID) | INTRAMUSCULAR | Status: DC | PRN

## 2014-01-27 NOTE — Progress Notes (Signed)
Patient attempting to get out of bed.  Reminded patient that he is to call staff using his call light button when he needs to urinate.  Patient voided on floor and bed.  Linens and gown changed

## 2014-01-27 NOTE — ED Provider Notes (Signed)
CSN: [id number]     Arrival date & time 01/27/14  1019 History   First MD Initiated Contact with Patient 01/27/14 1023     Chief Complaint  Patient presents with  . Hypoglycemia     (Consider location/radiation/quality/duration/timing/severity/associated sxs/prior Treatment) HPI A LEVEL 5 CAVEAT PERTAINS DUE TO DEMENTIA/ALTERED MENTAL STATUS Pt presents with hypoglycemia.  Pt was found this morning by home health nurse/aide who found him with decreased responsiveness.  EMS was called and blood sugar was 32, he was given 25g dextrose in route.  Pt has been more confused over the past 2 days per son when he goes to check on him and this improves with eating food.  Per son, he had been told a couple of months ago to stop taking glipizide, but in error this medication was refilled recently and patient went to pick this up from pharmacy.  He has dementia and is unsure if he has taken this medication, but some of the pills are missing.    Past Medical History  Diagnosis Date  . Type II or unspecified type diabetes mellitus without mention of complication, uncontrolled   . DEMENTIA   . Neuromuscular disorder   . Hypertension   . Gout   . Arthritis   . Dementia 04/24/2012  . Unspecified hereditary and idiopathic peripheral neuropathy   . Anemia, unspecified   . Hypotension, unspecified   . Acute kidney failure, unspecified   . Unspecified vitamin D deficiency   . Other and unspecified hyperlipidemia   . Hypopotassemia   . Disorder of bone and cartilage, unspecified   . Memory loss   . Hypotension, unspecified   . Acute kidney failure, unspecified   . Muscle weakness (generalized)   . Dizziness and giddiness   . Myalgia and myositis, unspecified   . Screening for ischemic heart disease   . Type II or unspecified type diabetes mellitus without mention of complication, uncontrolled   . Gout, unspecified   . Tobacco use disorder   . Memory loss   . Special screening for malignant neoplasm  of prostate    Past Surgical History  Procedure Laterality Date  . Inguinal hernia repair     Family History  Problem Relation Age of Onset  . Diabetes Mother   . Hypertension Mother   . Diabetes Father   . Hypertension Father   . Kidney disease Father   . Diabetes Sister   . Kidney disease Brother    History  Substance Use Topics  . Smoking status: Never Smoker   . Smokeless tobacco: Never Used  . Alcohol Use: Yes     Comment: 1 drink/day    Review of Systems UNABLE TO OBTAIN ROS DUE TO LEVEL 5 CAVEAT    Allergies  Penicillins  Home Medications   Current Outpatient Rx  Name  Route  Sig  Dispense  Refill  . feeding supplement, ENSURE COMPLETE, (ENSURE COMPLETE) LIQD   Oral   Take 237 mLs by mouth 2 (two) times daily between meals.   60 Bottle   0   . glucagon (GLUCAGON EMERGENCY) 1 MG injection   Intravenous   Inject 1 mg into the vein once as needed (low blood sugar and unable to take sugar by mouth).   1 each   0   . glucose 4 GM chewable tablet   Oral   Chew 1 tablet (4 g total) by mouth as needed for low blood sugar.   50 tablet   0  BP 100/81  Pulse 94  Temp(Src) 97.9 F (36.6 C) (Oral)  Resp 20  Ht 6\' 2"  (1.88 m)  Wt 170 lb 13.7 oz (77.5 kg)  BMI 21.93 kg/m2  SpO2 100% Vitals reviewed Physical Exam Physical Examination: General appearance - alert, well appearing, and in no distress Mental status - alert, oriented to person, place, and time, although seems confused about his medications Eyes - pupils equal and reactive, extraocular eye movements intact Mouth - mucous membranes moist, pharynx normal without lesions Chest - clear to auscultation, no wheezes, rales or rhonchi, symmetric air entry Heart - normal rate, regular rhythm, normal S1, S2, no murmurs, rubs, clicks or gallops Abdomen - soft, nontender, nondistended, no masses or organomegaly Neurological - alert, oriented x 3, cranial nerves 2-12 tested and intact, strength 5/5 in  extremities x 4, sensation intact Extremities - peripheral pulses normal, no pedal edema, no clubbing or cyanosis Skin - normal coloration and turgor, no rashes  ED Course  Procedures (including critical care time)  12:37 PM d/w Dr. Cena Benton, [facility]- pt to be admitted to med/surg bed, team 8     CRITICAL CARE Performed by: Ethelda Chick Total critical care time: 40 Critical care time was exclusive of separately billable procedures and treating other patients. Critical care was necessary to treat or prevent imminent or life-threatening deterioration. Critical care was time spent personally by me on the following activities: development of treatment plan with patient and/or surrogate as well as nursing, discussions with consultants, evaluation of patient's response to treatment, examination of patient, obtaining history from patient or surrogate, ordering and performing treatments and interventions, ordering and review of laboratory studies, ordering and review of radiographic studies, pulse oximetry and re-evaluation of patient's condition. Labs Review Labs Reviewed  CBC - Abnormal; Notable for the following:    HCT 38.3 (*)    MCHC 36.8 (*)    All other components within normal limits  COMPREHENSIVE METABOLIC PANEL - Abnormal; Notable for the following:    Potassium 3.6 (*)    Glucose, Bld 35 (*)    AST 200 (*)    ALT 122 (*)    All other components within normal limits  URINALYSIS, ROUTINE W REFLEX MICROSCOPIC - Abnormal; Notable for the following:    Glucose, UA 100 (*)    Hgb urine dipstick TRACE (*)    Protein, ur 30 (*)    All other components within normal limits  GLUCOSE, CAPILLARY - Abnormal; Notable for the following:    Glucose-Capillary 45 (*)    All other components within normal limits  BASIC METABOLIC PANEL - Abnormal; Notable for the following:    Sodium 134 (*)    Potassium 3.3 (*)    Glucose, Bld 145 (*)    All other components within normal limits  CBC -  Abnormal; Notable for the following:    MCHC 36.1 (*)    Platelets 125 (*)    All other components within normal limits  GLUCOSE, CAPILLARY - Abnormal; Notable for the following:    Glucose-Capillary 60 (*)    All other components within normal limits  GLUCOSE, CAPILLARY - Abnormal; Notable for the following:    Glucose-Capillary 112 (*)    All other components within normal limits  GLUCOSE, CAPILLARY - Abnormal; Notable for the following:    Glucose-Capillary 134 (*)    All other components within normal limits  HEPATIC FUNCTION PANEL - Abnormal; Notable for the following:    AST 108 (*)  ALT 100 (*)    Alkaline Phosphatase 126 (*)    Total Bilirubin 1.6 (*)    Indirect Bilirubin 1.3 (*)    All other components within normal limits  GLUCOSE, CAPILLARY - Abnormal; Notable for the following:    Glucose-Capillary 194 (*)    All other components within normal limits  GLUCOSE, CAPILLARY - Abnormal; Notable for the following:    Glucose-Capillary 229 (*)    All other components within normal limits  GLUCOSE, CAPILLARY - Abnormal; Notable for the following:    Glucose-Capillary 177 (*)    All other components within normal limits  GLUCOSE, CAPILLARY - Abnormal; Notable for the following:    Glucose-Capillary 187 (*)    All other components within normal limits  CBG MONITORING, ED - Abnormal; Notable for the following:    Glucose-Capillary 39 (*)    All other components within normal limits  URINE MICROSCOPIC-ADD ON  GLUCOSE, CAPILLARY  GLUCOSE, CAPILLARY  GLUCOSE, CAPILLARY  HEPATITIS PANEL, ACUTE  CBG MONITORING, ED  CBG MONITORING, ED   Imaging Review US Abdomen Limited  01/27/2014   CLINICAL DATA Transaminitis  EXAM US ABDOMEN LIMITED - RIGHT UPPER QUADRANT  COMPARISON None available (abdominal CT 02/13/2001)  FINDINGS Gallbladder:  Echogenic debris within the gallbladder, non shadowing. No wall thickening or sonographic Murphy's sign.  Common bile duct:  Diameter: 3 mm.   Where seen, no visible filling defect.  Liver:  No focal lesion is suspected. An echogenic area posterior to the right hepatic lobe is likely retroperitoneal fat (see chest CT 10/16/2005). Antegrade flow in the imaged portal venous system. Liver echogenicity is diffusely increased. Liver appears enlarged at the likely 20 cm craniocaudal span.  IMPRESSION 1. Fatty liver. 2. Gallbladder sludge.  No acute cholecystitis.  SIGNATURE  Electronically Signed   By: Tiburcio Pea M.D.   On: 01/27/2014 14:02     EKG Interpretation   Date/Time:  Tuesday January 27 2014 10:30:48 EDT Ventricular Rate:  91 PR Interval:  168 QRS Duration: 101 QT Interval:  364 QTC Calculation: 448 R Axis:   67 Text Interpretation:  Sinus rhythm Sinus rhythm Non-specific  intra-ventricular conduction delay minimal changes from last tracing  Borderline ECG Confirmed by Gerhard Munch  MD ([id number]) on 01/27/2014  10:48:15 AM      MDM   1. Hypoglycemia 2. Dementia 3. hypertension  Pt presnting with c/o decreased responsiveness and hypoglycemia.  Level of consicousness improved after D50 given, glucose was 32 per EMS. In the ED glucose was also low- given another dose of D50.  Renal function today is reassuring.  Pt seems to have mistakenly taken his sulfonylurea- this is an extended release preparation.  Pt admitted to [facility] for monitor of blood glucose and further treatment if needed.  Of note, LFTs elevated- no jaundice, abdominal US ordered.      Ethelda Chick, MD 01/28/14 [phone]

## 2014-01-27 NOTE — ED Notes (Signed)
Bed: WA09 Expected date:  Expected time:  Means of arrival:  Comments: ems- CBG hypoglycemia 32, given dextrose

## 2014-01-27 NOTE — Telephone Encounter (Signed)
I would recommend him talking with the physician in the hospital due to his acute condition; once he is discharged we would need to make a follow up appt for any further concerns or questions

## 2014-01-27 NOTE — Telephone Encounter (Signed)
pt found unresponsive this morning by home nurse, 911 called and Vara GuardianKen JR (pt's son) at the house.  Glipizide was stopped on 12/18/13 due to averaged A1-C was normal. [facility] after given medication to raise BS.  BS upon arrival was 32. Rocky LinkKen would like to discuss with someone regarding what might be going on with his dad's BS since not taking medication. Please advise. Thanks [employer] PartnersDenise

## 2014-01-27 NOTE — Telephone Encounter (Signed)
FYI: rx for the Glipizide was sent to the pharmacy on 01/14/14 and was picked up by the pt, his son didn't know that.  the previous rx for Glipizide was taken from him.   The hospital will keep him over night for observation and was notified what could of happened with medication.

## 2014-01-27 NOTE — ED Notes (Signed)
Per EMS pt coming home with c/o hypoglycemia, initial cbg was 32, 25g od dextrose given en route; per EMS pt's son stated pt is getting more disoriented then usual when he checks on him in the afternoon, and gets better after he feeds him

## 2014-01-27 NOTE — ED Notes (Signed)
Patient's son can be reached at [phone] if needed, and for updates.

## 2014-01-27 NOTE — Progress Notes (Signed)
Patient continues to climb over siderails getting out of bed.  Becoming combative and yelling he needs to get out of bed to pee.  Explained to patient for safety reasons to call staff using the call light, again instructing him how to use the nurse call button.  Spoke with patient's son regarding his status and his concern that someone care for his poodle at home. Notified Dr, Cena BentonVega , orders received .  Linens and patient gown changed again.

## 2014-01-27 NOTE — Progress Notes (Signed)
INITIAL NUTRITION ASSESSMENT  Pt meets criteria for severe MALNUTRITION in the context of chronic illness as evidenced by <75% estimated energy intake in the past month with 9% weight loss in the past 2 months.  DOCUMENTATION CODES Per approved criteria  -Severe malnutrition in the context of chronic illness   INTERVENTION: - Ensure Complete BID - Encouraged excellent meal intake and to eat meals/snacks/drink supplements throughout the day to help prevent hypoglycemia - Will continue to monitor   NUTRITION DIAGNOSIS: Unintended weight loss related to poor appetite as evidenced by pt report.   Goal: 1. Blood sugar to be WNL 2. Pt to consume >90% of meals/supplements  Monitor:  Weights, labs, intake, blood sugar  Reason for Assessment: Consult for assessment   73 y.o. male  Admitting Dx: Hypoglycemia  ASSESSMENT: Pt with history of diabetes, hypertension, dementia, hyperlipidemia, arthritis, and gout. Patient presented to the ED with hypoglycemia. He was found this morning in his home by the home health nurse/aid with decreased responsiveness. EMS was called and they found him confused with a blood sugar of 32 mg/dL.   Met with pt who was alone in room. He reported he's been eating poorly in the past week, just eating breakfast and not much else throughout the day. States he drinks 1 Ensure/day. Pt's weight down 17 pounds in the past 2 months.   Blood sugar low, pt getting D5 in IVF Potassium low AST/ALT elevated    Height: Ht Readings from Last 1 Encounters:  01/27/14 6' 2" (1.88 m)    Weight: Wt Readings from Last 1 Encounters:  01/27/14 170 lb 13.7 oz (77.5 kg)    Ideal Body Weight: 190 lb   % Ideal Body Weight: 89%  Wt Readings from Last 10 Encounters:  01/27/14 170 lb 13.7 oz (77.5 kg)  12/18/13 187 lb (84.823 kg)  05/05/12 178 lb 14.4 oz (81.149 kg)    Usual Body Weight: 180-185 lb   % Usual Body Weight: 92-94%  BMI:  Body mass index is 21.93  kg/(m^2).  Estimated Nutritional Needs: Kcal: 2150-2350 Protein: 105-125g Fluid: 2.1-2.3L/day  Skin: Intact   Diet Order: Carb Control  EDUCATION NEEDS: -No education needs identified at this time  No intake or output data in the 24 hours ending 01/27/14 1505  Last BM: PTA  Labs:   Recent Labs Lab 01/27/14 1100  NA 142  K 3.6*  CL 105  CO2 19  BUN 15  CREATININE 0.66  CALCIUM 8.8  GLUCOSE 35*    CBG (last 3)   Recent Labs  01/27/14 1138 01/27/14 1330 01/27/14 1427  GLUCAP 90 81 45*    Scheduled Meds: . amLODipine  10 mg Oral Daily  . aspirin  81 mg Oral Daily  . feeding supplement (ENSURE)  1 Container Oral TID BM  . heparin  5,000 Units Subcutaneous 3 times per day  . memantine  10 mg Oral BID    Continuous Infusions: . dextrose 5 % and 0.45 % NaCl with KCl 20 mEq/L      Past Medical History  Diagnosis Date  . Type II or unspecified type diabetes mellitus without mention of complication, uncontrolled   . DEMENTIA   . Neuromuscular disorder   . Hypertension   . Gout   . Arthritis   . Dementia 04/24/2012  . Unspecified hereditary and idiopathic peripheral neuropathy   . Anemia, unspecified   . Hypotension, unspecified   . Acute kidney failure, unspecified   . Unspecified vitamin D deficiency   .  Other and unspecified hyperlipidemia   . Hypopotassemia   . Disorder of bone and cartilage, unspecified   . Memory loss   . Hypotension, unspecified   . Acute kidney failure, unspecified   . Muscle weakness (generalized)   . Dizziness and giddiness   . Myalgia and myositis, unspecified   . Screening for ischemic heart disease   . Type II or unspecified type diabetes mellitus without mention of complication, uncontrolled   . Gout, unspecified   . Tobacco use disorder   . Memory loss   . Special screening for malignant neoplasm of prostate     Past Surgical History  Procedure Laterality Date  . Inguinal hernia repair      Mikey College MS,  RD, LDN [phone] Pager [phone] After Hours Pager

## 2014-01-27 NOTE — ED Notes (Signed)
Ultrasound tech at bedside

## 2014-01-27 NOTE — Progress Notes (Signed)
Linens and gown changed, supper ordered.  CBG = 60, given orange juice, dinner ordered.

## 2014-01-27 NOTE — ED Notes (Signed)
Pt's son checked pt's home meds and sts that pt has taken 4 tablets of Glipizide ER 5 mg sometimes between 2/27 and this morning, even though those have been discontinued

## 2014-01-27 NOTE — Progress Notes (Signed)
Patient received from ED via stretcher.  IV pulled out, complains of headache 7/10, states he has been taking his glipizide at home because his doctor called and told him to restart the medication.  IV restarted, CBG = 45, given OJ and an ensure, patient states has not had food since breakfast this am.  Patient appears confused but alert to name/date/time/place.  Instructed patient on how to order meals, call light and the need to call staff to assist him out of bed due to being on bed rest.  Dr, Cena BentonVega paged

## 2014-01-27 NOTE — ED Notes (Signed)
RN will start IV collect blood then

## 2014-01-27 NOTE — Progress Notes (Signed)
Attempted to contact patient's son, Hanley BenKenneth Nary Jr at both numbers listed.  Left voice mail on home phone and unable to leave message on mobile due to full mailbox.

## 2014-01-27 NOTE — H&P (Signed)
[facility] History and Physical  [name]:[id number] DOB: 24-Jul-1941 DOA: 01/27/2014  Referring physician: Dr Jerelyn Scott PCP: Oneal Grout, MD   Chief Complaint: Altered mental status  HPI: Gary Mcneil is a 73 y.o. male  With a history of diabetes, hypertension, dementia, hyperlipidemia, arthritis, and gout. Patient presented to the ED with hypoglycemia. He was found this morning in his home by the home health nurse/aid with decreased responsiveness. EMS was called and they found him confused with a blood sugar of 32. He was given 25 g dextrose en route to the ED. The patient's son reports that he had been told a couple of months ago to stop taking his glipizide, but in error his medication was refilled and the patient picked it up from the pharmacy. He has dementia and is unsure if he took his glipizide or not, but some of the pills are missing.   In the ED the patient was given a snack. Currently the patient is denying any chest pain, palpitations, shortness of breath, headache, dizziness, blurred vision, or fatigue. His mentation appears to be at baseline. Patient found to have elevated liver enzymes and ultrasound done with results pending. Admit for close monitoring of blood sugar due to long acting effect of medication. Patient is anxious about who will take care of his dog while he is in the hospital, son is aware of the situation.   Review of Systems:  Constitutional:  No weight loss, night sweats, Fevers, chills, fatigue.  HEENT:  No headaches, Difficulty swallowing,Tooth/dental problems,Sore throat,  No sneezing, itching, ear ache, nasal congestion, post nasal drip,  Cardio-vascular:  No chest pain, Orthopnea, PND, swelling in lower extremities, anasarca, dizziness, palpitations  GI:  No heartburn, indigestion, abdominal pain, nausea, vomiting, diarrhea, change in bowel habits, loss of appetite  Resp:  No shortness of breath with exertion or at rest. No  excess mucus, no productive cough, No non-productive cough, No coughing up of blood.No change in color of mucus.No wheezing.No chest wall deformity  Skin:  no rash or lesions.  GU:  no dysuria, change in color of urine, no urgency or frequency. No flank pain.  Musculoskeletal:  No joint pain or swelling. No decreased range of motion. No back pain.  Psych:  No change in mood or affect. No depression. + anxiety related to someone taking care of his dog while he is in the hospital. + memory loss related to dementia.   Past Medical History  Diagnosis Date  . Type II or unspecified type diabetes mellitus without mention of complication, uncontrolled   . DEMENTIA   . Neuromuscular disorder   . Hypertension   . Gout   . Arthritis   . Dementia 04/24/2012  . Unspecified hereditary and idiopathic peripheral neuropathy   . Anemia, unspecified   . Hypotension, unspecified   . Acute kidney failure, unspecified   . Unspecified vitamin D deficiency   . Other and unspecified hyperlipidemia   . Hypopotassemia   . Disorder of bone and cartilage, unspecified   . Memory loss   . Hypotension, unspecified   . Acute kidney failure, unspecified   . Muscle weakness (generalized)   . Dizziness and giddiness   . Myalgia and myositis, unspecified   . Screening for ischemic heart disease   . Type II or unspecified type diabetes mellitus without mention of complication, uncontrolled   . Gout, unspecified   . Tobacco use disorder   . Memory loss   . Special screening  for malignant neoplasm of prostate    Past Surgical History  Procedure Laterality Date  . Inguinal hernia repair     Social History:  reports that he has never smoked. He does not have any smokeless tobacco history on file. He reports that he drinks alcohol. He reports that he does not use illicit drugs.  Allergies  Allergen Reactions  . Penicillins Rash    Family History  Problem Relation Age of Onset  . Diabetes Mother   .  Hypertension Mother   . Diabetes Father   . Hypertension Father   . Kidney disease Father   . Diabetes Sister   . Kidney disease Brother      Prior to Admission medications   Medication Sig Start Date End Date Taking? Authorizing Provider  acetaminophen (TYLENOL) 325 MG tablet Take 650 mg by mouth every 6 (six) hours as needed for pain. Take two tablets every 6 hours as needed for pain    Historical Provider, MD  allopurinol (ZYLOPRIM) 100 MG tablet TAKE 1 TABLET BY MOUTH EVERY DAY 05/13/13   Oneal Grout, MD  amLODipine (NORVASC) 10 MG tablet TAKE 1 TABLET BY MOUTH DAILY 12/21/13   Claudie Revering, NP  aspirin 81 MG tablet Take 81 mg by mouth daily. Take one tablet once a day    Historical Provider, MD  doxycycline (VIBRAMYCIN) 100 MG capsule Take 1 capsule (100 mg total) by mouth 2 (two) times daily. 12/18/13   Claudie Revering, NP  gabapentin (NEURONTIN) 300 MG capsule Take 1 tablet twice a day for nerve pain 01/14/14   Oneal Grout, MD  memantine (NAMENDA) 10 MG tablet Take 1 tablet (10 mg total) by mouth 2 (two) times daily. 02/11/13 02/11/14  Oneal Grout, MD  potassium chloride SA (K-DUR,KLOR-CON) 20 MEQ tablet Take 1 tablet (20 mEq total) by mouth 2 (two) times daily. 12/19/13   Claudie Revering, NP  Vitamin D, Ergocalciferol, (DRISDOL) 50000 UNITS CAPS capsule TAKE ONE CAPSULE BY MOUTH ONCE A WEEK 07/11/13   Kimber Relic, MD   Physical Exam: Filed Vitals:   01/27/14 1023  BP: 168/98  Pulse: 90  Temp:   Resp: 17    BP 168/98  Pulse 90  Temp(Src) 97.7 F (36.5 C) (Oral)  Resp 17  SpO2 100%  General:  Appears comfortable. Anxious about who will take care of his dog. Eyes: PERRL, normal lids, irises & conjunctiva ENT: grossly normal hearing, lips & tongue Neck: no LAD, masses or thyromegaly Cardiovascular: RRR, no m/r/g. No LE edema. Telemetry: SR, no arrhythmias. No ST elevation/depression  Respiratory: CTA bilaterally, no w/r/r. Normal respiratory effort. Abdomen: soft,  ntnd Skin: no rash or induration seen on limited exam Musculoskeletal: grossly normal tone BUE/BLE Psychiatric: grossly normal mood and affect, speech fluent and appropriate.   Neurologic: grossly non-focal. Unable to remember what medication he takes or what happened this morning surrounding the events.          Labs on Admission:  Basic Metabolic Panel:  Recent Labs Lab 01/27/14 1100  NA 142  K 3.6*  CL 105  CO2 19  GLUCOSE 35*  BUN 15  CREATININE 0.66  CALCIUM 8.8   Liver Function Tests:  Recent Labs Lab 01/27/14 1100  AST 200*  ALT 122*  ALKPHOS 92  BILITOT 0.7  PROT 7.0  ALBUMIN 4.0   No results found for this basename: LIPASE, AMYLASE,  in the last 168 hours No results found for this basename: AMMONIA,  in  the last 168 hours CBC:  Recent Labs Lab 01/27/14 1100  WBC 7.1  HGB 14.1  HCT 38.3*  MCV 87.8  PLT 154   Cardiac Enzymes: No results found for this basename: CKTOTAL, CKMB, CKMBINDEX, TROPONINI,  in the last 168 hours  BNP (last 3 results) No results found for this basename: PROBNP,  in the last 8760 hours CBG:  Recent Labs Lab 01/27/14 1047 01/27/14 1138  GLUCAP 39* 90    Radiological Exams on Admission: No results found.  EKG: Independently reviewed. SR. No ST elevations or depressions  Assessment/Plan Active Problems:   Hypoglycemia - Resolved, BS currently 81 - MIVF- D5 1/2 NS at 100 ml/hr started - Pt given dextrose by EMS and snack in ED - Mentation at baseline - Continue to monitor    DM (diabetes mellitus) - MIVF - Diet controlled DM-Carb modified diet - monitor BS    Hypokalemia - Replaced - Recheck in AM    Elevated liver enzymes - Ultrasound of ABD: fatty liver, gallbladder sludge. No acute choleystitis - AST 200/ALT 122 on admission    HTN (hypertension) - Continue to monitor - Continue amlodipine and aspirin  Dementia - Continue memantine   DVT prevention: Heparin and Aspirin   Code Status:  FULL Family Communication: No family at bedside. Spoke with patient directly regarding plan of care Disposition Plan: Pending further improvement   Time spent: > 60 min  Penny PiaVEGA, Kanika Bungert [facility] Pager [phone]

## 2014-01-28 DIAGNOSIS — E119 Type 2 diabetes mellitus without complications: Secondary | ICD-10-CM

## 2014-01-28 DIAGNOSIS — F0390 Unspecified dementia without behavioral disturbance: Secondary | ICD-10-CM

## 2014-01-28 DIAGNOSIS — E162 Hypoglycemia, unspecified: Secondary | ICD-10-CM

## 2014-01-28 DIAGNOSIS — E43 Unspecified severe protein-calorie malnutrition: Secondary | ICD-10-CM

## 2014-01-28 LAB — GLUCOSE, CAPILLARY
GLUCOSE-CAPILLARY: 177 mg/dL — AB (ref 70–99)
Glucose-Capillary: 134 mg/dL — ABNORMAL HIGH (ref 70–99)
Glucose-Capillary: 194 mg/dL — ABNORMAL HIGH (ref 70–99)
Glucose-Capillary: 229 mg/dL — ABNORMAL HIGH (ref 70–99)
Glucose-Capillary: 187 mg/dL — ABNORMAL HIGH (ref 70–99)
Glucose-Capillary: 263 mg/dL — ABNORMAL HIGH (ref 70–99)

## 2014-01-28 LAB — HEPATITIS PANEL, ACUTE
HCV Ab: NEGATIVE
HEP B C IGM: NONREACTIVE
Hep A IgM: NONREACTIVE
Hepatitis B Surface Ag: NEGATIVE

## 2014-01-28 LAB — HEPATIC FUNCTION PANEL
ALK PHOS: 126 U/L — AB (ref 39–117)
ALT: 100 U/L — AB (ref 0–53)
AST: 108 U/L — ABNORMAL HIGH (ref 0–37)
Albumin: 4.4 g/dL (ref 3.5–5.2)
Bilirubin, Direct: 0.3 mg/dL (ref 0.0–0.3)
Indirect Bilirubin: 1.3 mg/dL — ABNORMAL HIGH (ref 0.3–0.9)
Total Bilirubin: 1.6 mg/dL — ABNORMAL HIGH (ref 0.3–1.2)
Total Protein: 7.6 g/dL (ref 6.0–8.3)

## 2014-01-28 LAB — BASIC METABOLIC PANEL
BUN: 7 mg/dL (ref 6–23)
CALCIUM: 8.8 mg/dL (ref 8.4–10.5)
CO2: 21 mEq/L (ref 19–32)
Chloride: 98 mEq/L (ref 96–112)
Creatinine, Ser: 0.67 mg/dL (ref 0.50–1.35)
GFR calc Af Amer: >90 mL/min (ref >90)
GFR calc non Af Amer: >90 mL/min (ref >90)
GLUCOSE: 145 mg/dL — AB (ref 70–99)
Potassium: 3.3 mEq/L — ABNORMAL LOW (ref 3.7–5.3)
Sodium: 134 mEq/L — ABNORMAL LOW (ref 137–147)

## 2014-01-28 LAB — CBC
HEMATOCRIT: 39.1 % (ref 39.0–52.0)
HEMOGLOBIN: 14.1 g/dL (ref 13.0–17.0)
MCH: 32.3 pg (ref 26.0–34.0)
MCHC: 36.1 g/dL — ABNORMAL HIGH (ref 30.0–36.0)
MCV: 89.5 fL (ref 78.0–100.0)
Platelets: 125 10*3/uL — ABNORMAL LOW (ref 150–400)
RBC: 4.37 MIL/uL (ref 4.22–5.81)
RDW: 14.4 % (ref 11.5–15.5)
WBC: 5.5 10*3/uL (ref 4.0–10.5)

## 2014-01-28 MED ORDER — GLUCOSE 4 G PO CHEW: 1.0000 | CHEWABLE_TABLET | ORAL | Status: DC | PRN

## 2014-01-28 MED ORDER — ENSURE COMPLETE PO LIQD: 237.0000 mL | Freq: Two times a day (BID) | ORAL | Status: DC

## 2014-01-28 MED ORDER — GLUCAGON (RDNA) 1 MG IJ KIT: 1.0000 mg | PACK | Freq: Once | INTRAMUSCULAR | Status: DC | PRN

## 2014-01-28 MED ORDER — POTASSIUM CHLORIDE CRYS ER 20 MEQ PO TBCR
40.0000 meq | EXTENDED_RELEASE_TABLET | Freq: Once | ORAL | Status: AC
  Administered 2014-01-28: 40 meq via ORAL
  Filled 2014-01-28: qty 2

## 2014-01-28 MED ORDER — ASPIRIN 81 MG PO CHEW
81.0000 mg | CHEWABLE_TABLET | Freq: Every day | ORAL | Status: DC
  Administered 2014-01-28: 81 mg via ORAL
  Filled 2014-01-28: qty 1

## 2014-01-28 MED ORDER — AMLODIPINE BESYLATE 10 MG PO TABS
10.0000 mg | ORAL_TABLET | Freq: Every day | ORAL | Status: DC
  Administered 2014-01-28: 10 mg via ORAL
  Filled 2014-01-28: qty 1

## 2014-01-28 NOTE — Progress Notes (Signed)
Inpatient Diabetes Program Recommendations  AACE/ADA: New Consensus Statement on Inpatient Glycemic Control (2013)  Target Ranges:  Prepandial:   less than 140 mg/dL      Peak postprandial:   less than 180 mg/dL (1-2 hours)      Critically ill patients:  140 - 180 mg/dL   73 y.o. male with a history of diabetes, hypertension, dementia, hyperlipidemia, arthritis, and gout. Patient presented to the ED with hypoglycemia. The patient's son reports that he had been told a couple of months ago to stop taking his glipizide, but in error his medication was refilled and the patient picked it up from the pharmacy. He has dementia and is unsure if he took his glipizide or not, but some of the pills are missing.   Inpatient Diabetes Program Recommendations Correction (SSI): Add Novolog sensitive tidwc if blood sugars >180 mg/dL on a consistent basis.  Will follow while inpatient. Thank you. Ailene Ardshonda Aylinn Rydberg, RD, LDN, CDE Inpatient Diabetes Coordinator [phone]

## 2014-01-28 NOTE — Progress Notes (Signed)
[facility] PROGRESS NOTE  Gary Mcneil:[id number] DOB: 1941/01/14 DOA: 01/27/2014 PCP: Oneal Grout, MD  Assessment/Plan  Hypoglycemia, resolving -  D/c IVF -  Check CBG 1 hour and 2 hours after stopping dextrose fluids to check for hypoglycemia -  Hypoglycemia protocol prn DM (diabetes mellitus) A1c 6.6 12/18/13 - Diet controlled DM-Carb modified diet  - monitor BS  Hypokalemia, recurrent - oral KCl Elevated liver enzymes  - Ultrasound of ABD: fatty liver, gallbladder sludge. No acute choleystitis  - AST 200/ALT 122 on admission  - repeat this AM -  Check hepatitis panel HTN (hypertension)  - Continue to monitor  - Continue amlodipine and aspirin  Dementia  - Continue memantine  -  Continue prn haldol Severe protein cal malnutrition -  Appreciate nutrition recs -  Continue supplements and liberalized diet  Diet:  diabetic Access:  PIV IVF:  off Proph:  heparin  Code Status: full Family Communication: patient alone Disposition Plan: pending CBG stable off dextrose fluids   Consultants:  None  Procedures:  Abd [place]  Antibiotics:  none   HPI/Subjective:  States he feels well.  Does not remember agitation overnight.      Objective: Filed Vitals:   01/27/14 1436 01/27/14 2143 01/28/14 0545 01/28/14 0700  BP: 173/89 164/98 147/100 122/78  Pulse: 96 90 91   Temp: 98.2 F (36.8 C) 98.3 F (36.8 C) 97.9 F (36.6 C)   TempSrc: Oral Oral Oral   Resp: 18 18 18    Height: 6\' 2"  (1.88 m)     Weight: 77.5 kg (170 lb 13.7 oz)     SpO2: 99% 100% 100%     Intake/Output Summary (Last 24 hours) at 01/28/14 0842 Last data filed at 01/28/14 8119  Gross per 24 hour  Intake      0 ml  Output    500 ml  Net   -500 ml   Filed Weights   01/27/14 1436  Weight: 77.5 kg (170 lb 13.7 oz)    Exam:   General:  Thin BM, No acute distress  HEENT:  NCAT, MMM  Cardiovascular:  RRR, nl S1, S2 no mrg, 2+ pulses, warm extremities  Respiratory:  CTAB,  no increased WOB  Abdomen:   NABS, soft, NT/ND  MSK:   Normal tone and bulk, no LEE  Neuro:  Grossly intact  Psych:  A&Ox4 including reason for admission  Data Reviewed: Basic Metabolic Panel:  Recent Labs Lab 01/27/14 1100 01/28/14 0510  NA 142 134*  K 3.6* 3.3*  CL 105 98  CO2 19 21  GLUCOSE 35* 145*  BUN 15 7  CREATININE 0.66 0.67  CALCIUM 8.8 8.8   Liver Function Tests:  Recent Labs Lab 01/27/14 1100  AST 200*  ALT 122*  ALKPHOS 92  BILITOT 0.7  PROT 7.0  ALBUMIN 4.0   No results found for this basename: LIPASE, AMYLASE,  in the last 168 hours No results found for this basename: AMMONIA,  in the last 168 hours CBC:  Recent Labs Lab 01/27/14 1100 01/28/14 0510  WBC 7.1 5.5  HGB 14.1 14.1  HCT 38.3* 39.1  MCV 87.8 89.5  PLT 154 125*   Cardiac Enzymes: No results found for this basename: CKTOTAL, CKMB, CKMBINDEX, TROPONINI,  in the last 168 hours BNP (last 3 results) No results found for this basename: PROBNP,  in the last 8760 hours CBG:  Recent Labs Lab 01/27/14 1735 01/27/14 1812 01/27/14 2004 01/27/14 2352 01/28/14 0359  GLUCAP 60*  89 83 112* 134*    No results found for this or any previous visit (from the past 240 hour(s)).   Studies: [place] Abdomen Limited  01/27/2014   CLINICAL DATA Transaminitis  EXAM US ABDOMEN LIMITED - RIGHT UPPER QUADRANT  COMPARISON None available (abdominal CT 02/13/2001)  FINDINGS Gallbladder:  Echogenic debris within the gallbladder, non shadowing. No wall thickening or sonographic Murphy's sign.  Common bile duct:  Diameter: 3 mm.  Where seen, no visible filling defect.  Liver:  No focal lesion is suspected. An echogenic area posterior to the right hepatic lobe is likely retroperitoneal fat (see chest CT 10/16/2005). Antegrade flow in the imaged portal venous system. Liver echogenicity is diffusely increased. Liver appears enlarged at the likely 20 cm craniocaudal span.  IMPRESSION 1. Fatty liver. 2. Gallbladder  sludge.  No acute cholecystitis.  SIGNATURE  Electronically Signed   By: Tiburcio PeaJonathan  Watts M.D.   On: 01/27/2014 14:02    Scheduled Meds: . amLODipine  10 mg Oral Daily  . aspirin  81 mg Oral Daily  . feeding supplement (ENSURE COMPLETE)  237 mL Oral BID BM  . heparin  5,000 Units Subcutaneous 3 times per day  . memantine  10 mg Oral BID  . potassium chloride  40 mEq Oral Once   Continuous Infusions: . dextrose 5 % and 0.45 % NaCl with KCl 20 mEq/L 100 mL (01/27/14 1556)    Principal Problem:   Hypoglycemia Active Problems:   HTN (hypertension)   DM (diabetes mellitus)   Dementia   Hypokalemia   Elevated liver enzymes   Protein-calorie malnutrition, severe    Time spent: 30 min    Hanae Waiters, [facility]ACK[facility]ZIE  [facility] Pager [phone]. If 7PM-7AM, please contact night-coverage at [email or web address], password [email or web address] 01/28/2014, 8:42 AM  LOS: 1 day

## 2014-01-28 NOTE — Discharge Summary (Signed)
Physician Discharge Summary  Gary Mcneil JXB:[id number] DOB: Nov 13, 1941 DOA: 01/27/2014  PCP: Oneal Grout, MD  Admit date: 01/27/2014 Discharge date: 01/28/2014  Recommendations for Outpatient Follow-up:  1. Follow up with primary care doctor in 1-2 weeks.  Please f/u acute hepatitis panel and repeat LFTs please.  2. Family given glucose and glucagon pen for emergency purposes.    Discharge Diagnoses:  Principal Problem:   Hypoglycemia Active Problems:   HTN (hypertension)   DM (diabetes mellitus)   Dementia   Hypokalemia   Elevated liver enzymes   Protein-calorie malnutrition, severe   Discharge Condition: stable, improved  Diet recommendation: diabetic  Wt Readings from Last 3 Encounters:  01/27/14 77.5 kg (170 lb 13.7 oz)  12/18/13 84.823 kg (187 lb)  05/05/12 81.149 kg (178 lb 14.4 oz)    History of present illness:  With a history of diabetes, hypertension, dementia, hyperlipidemia, arthritis, and gout. Patient presented to the ED with hypoglycemia. He was found this morning in his home by the home health nurse/aid with decreased responsiveness. EMS was called and they found him confused with a blood sugar of 32. He was given 25 g dextrose en route to the ED. The patient's son reports that he had been told a couple of months ago to stop taking his glipizide, but in error his medication was refilled and the patient picked it up from the pharmacy. He has dementia and is unsure if he took his glipizide or not, but some of the pills are missing.  In the ED the patient was given a snack. Currently the patient is denying any chest pain, palpitations, shortness of breath, headache, dizziness, blurred vision, or fatigue. His mentation appears to be at baseline. Patient found to have elevated liver enzymes and ultrasound done with results pending. Admit for close monitoring of blood sugar due to long acting effect of medication. Patient is anxious about who will take care of his dog  while he is in the hospital, son is aware of the situation.  Hospital Course:   Hypoglycemia, may have been secondary to accidental ingestion of SU.  Patient has dementia and is intermittently confused, though at other times, he seems quite lucid.  He was given IV dextrose initially and then started on dextrose infusion which he continued for 24 hours.  He CBGs trended up to the high 100s at which time his dextrose infusion was discontinued.  Continued to check CBGs for an additional 6-8 hours, without further hypoglycemia.  Advised to have family administer his medications with supervision.    DM (diabetes mellitus) A1c 6.6 12/18/13.  Diet controlled DM-Carb modified diet.  Stop all diabetes medications.    Hypokalemia, recurrent.  Oral KCl repletion given   Elevated liver enzymes, ultrasound of ABD: fatty liver, gallbladder sludge. No acute cholecystitis,  LFTs trended down.  No abdominal pain, nausea, or diarrhea.  Acute hepatitis panel is pending at the time of discharge.  Avoid tylenol and EtOH.    HTN (hypertension), stable.  Continued amlodipine and aspirin   Dementia with sundowning.  Continued memantine and given haldol prn.    Severe protein cal malnutrition.  Evaluated by nutrition and should continue liberalized diabetic diet and supplements  Consultants:  None Procedures:  Abd [place] Antibiotics:  none    Discharge Exam: Filed Vitals:   01/28/14 0900  BP: 100/81  Pulse: 94  Temp:   Resp: 20   Filed Vitals:   01/27/14 2143 01/28/14 0545 01/28/14 0700 01/28/14 0900  BP:  164/98 147/100 122/78 100/81  Pulse: 90 91  94  Temp: 98.3 F (36.8 C) 97.9 F (36.6 C)    TempSrc: Oral Oral    Resp: 18 18  20   Height:      Weight:      SpO2: 100% 100%      General: Thin BM, No acute distress  HEENT: NCAT, MMM  Cardiovascular: RRR, nl S1, S2 no mrg, 2+ pulses, warm extremities  Respiratory: CTAB, no increased WOB  Abdomen: NABS, soft, NT/ND  MSK: Normal tone and bulk, no  LEE  Neuro: Grossly intact  Psych: A&Ox4 including reason for admission   Discharge Instructions      Discharge Orders   Future Appointments Provider Department Dept Phone   06/04/2014 3:30 PM Claudie Revering, NP [facility] [phone]   Future Orders Complete By Expires   Call MD for:  difficulty breathing, headache or visual disturbances  As directed    Call MD for:  extreme fatigue  As directed    Call MD for:  hives  As directed    Call MD for:  persistant dizziness or light-headedness  As directed    Call MD for:  persistant nausea and vomiting  As directed    Call MD for:  severe uncontrolled pain  As directed    Call MD for:  temperature >100.4  As directed    Diet Carb Modified  As directed    Discharge instructions  As directed    Comments:     [name] was hospitalized with confusion related to low blood sugar.  He may have accidentally taken his glipizide.  Recommend that all diabetes medications be thrown away for safety.  He should eat a low carbohydrate diet with supplements for now.  His liver tests were abnormal.  There are some hepatitis tests which are pending at the time of discharge.  These tests sometimes take several days to come back, so please follow up with his primary care doctor in about 1 week to review results and to repeat his liver tests.  Please make sure he avoids tylenol-containing medications, cholesterol medications, and alcohol.  He should be supervised at all times.   Driving Restrictions  As directed    Comments:     No driving or operating heavy machinery   Increase activity slowly  As directed    Other Restrictions  As directed    Comments:     Supervision of medications       Medication List    STOP taking these medications       acetaminophen 325 MG tablet  Commonly known as:  TYLENOL      TAKE these medications       allopurinol 100 MG tablet  Commonly known as:  ZYLOPRIM  TAKE 1 TABLET BY MOUTH EVERY DAY      amLODipine 10 MG tablet  Commonly known as:  NORVASC  TAKE 1 TABLET BY MOUTH DAILY     feeding supplement (ENSURE COMPLETE) Liqd  Take 237 mLs by mouth 2 (two) times daily between meals.     gabapentin 300 MG capsule  Commonly known as:  NEURONTIN  Take 300 mg by mouth 2 (two) times daily. Take 1 tablet twice a day for nerve pain     glucagon 1 MG injection  Commonly known as:  GLUCAGON EMERGENCY  Inject 1 mg into the vein once as needed (low blood sugar and unable to take sugar by mouth).  glucose 4 GM chewable tablet  Chew 1 tablet (4 g total) by mouth as needed for low blood sugar.     memantine 10 MG tablet  Commonly known as:  NAMENDA  Take 1 tablet (10 mg total) by mouth 2 (two) times daily.     multivitamin with minerals Tabs tablet  Take 1 tablet by mouth daily.     potassium chloride SA 20 MEQ tablet  Commonly known as:  K-DUR,KLOR-CON  Take 1 tablet (20 mEq total) by mouth 2 (two) times daily.       Follow-up Information   Follow up with Oneal GroutPANDEY, MAHIMA, MD In 1 week.   Specialty:  Internal Medicine   Contact information:   [address] [place] [address] [phone]        The results of significant diagnostics from this hospitalization (including imaging, microbiology, ancillary and laboratory) are listed below for reference.    Significant Diagnostic Studies: [place] Abdomen Limited  01/27/2014   CLINICAL DATA Transaminitis  EXAM US ABDOMEN LIMITED - RIGHT UPPER QUADRANT  COMPARISON None available (abdominal CT 02/13/2001)  FINDINGS Gallbladder:  Echogenic debris within the gallbladder, non shadowing. No wall thickening or sonographic Murphy's sign.  Common bile duct:  Diameter: 3 mm.  Where seen, no visible filling defect.  Liver:  No focal lesion is suspected. An echogenic area posterior to the right hepatic lobe is likely retroperitoneal fat (see chest CT 10/16/2005). Antegrade flow in the imaged portal venous system. Liver echogenicity is diffusely  increased. Liver appears enlarged at the likely 20 cm craniocaudal span.  IMPRESSION 1. Fatty liver. 2. Gallbladder sludge.  No acute cholecystitis.  SIGNATURE  Electronically Signed   By: Tiburcio PeaJonathan  Watts M.D.   On: 01/27/2014 14:02    Microbiology: No results found for this or any previous visit (from the past 240 hour(s)).   Labs: Basic Metabolic Panel:  Recent Labs Lab 01/27/14 1100 01/28/14 0510  NA 142 134*  K 3.6* 3.3*  CL 105 98  CO2 19 21  GLUCOSE 35* 145*  BUN 15 7  CREATININE 0.66 0.67  CALCIUM 8.8 8.8   Liver Function Tests:  Recent Labs Lab 01/27/14 1100 01/28/14 0900  AST 200* 108*  ALT 122* 100*  ALKPHOS 92 126*  BILITOT 0.7 1.6*  PROT 7.0 7.6  ALBUMIN 4.0 4.4   No results found for this basename: LIPASE, AMYLASE,  in the last 168 hours No results found for this basename: AMMONIA,  in the last 168 hours CBC:  Recent Labs Lab 01/27/14 1100 01/28/14 0510  WBC 7.1 5.5  HGB 14.1 14.1  HCT 38.3* 39.1  MCV 87.8 89.5  PLT 154 125*   Cardiac Enzymes: No results found for this basename: CKTOTAL, CKMB, CKMBINDEX, TROPONINI,  in the last 168 hours BNP: BNP (last 3 results) No results found for this basename: PROBNP,  in the last 8760 hours CBG:  Recent Labs Lab 01/28/14 0359 01/28/14 0852 01/28/14 1006 01/28/14 1057 01/28/14 1213  GLUCAP 134* 194* 229* 177* 187*    Time coordinating discharge: 45 minutes  Signed:  Nelson Julson  [facility] 01/28/2014, 2:07 PM

## 2014-01-28 NOTE — Evaluation (Signed)
Physical Therapy Evaluation Patient Details Name: Gary DadaKenneth E Burks MRN: [id number] DOB: 05/25/1941 Today's Date: 01/28/2014 Time: [id number] PT Time Calculation (min): 10 min  PT Assessment / Plan / Recommendation History of Present Illness  hypoglycemic episode, LOC at home.  Clinical Impression  Pt ambulated in hall today. Pt does not require any assistance to ambulate. No further PT indicated.    PT Assessment  Patent does not need any further PT services    Follow Up Recommendations  No PT follow up    Does the patient have the potential to tolerate intense rehabilitation      Barriers to Discharge        Equipment Recommendations  None recommended by PT    Recommendations for Other Services     Frequency      Precautions / Restrictions Precautions Precautions: Fall   Pertinent Vitals/Pain       Mobility  Ambulation/Gait Ambulation/Gait assistance: Supervision Ambulation Distance (Feet): 300 Feet Assistive device: None Gait Pattern/deviations: Step-through pattern General Gait Details: pt ambulated without difficulty    Exercises     PT Diagnosis:    PT Problem List:   PT Treatment Interventions:       PT Goals(Current goals can be found in the care plan section) Acute Rehab PT Goals PT Goal Formulation: No goals set, d/c therapy  Visit Information  Last PT Received On: 01/28/14 Assistance Needed: +1 History of Present Illness: hypoglycemic episode, LOC at home.       Prior Functioning  Home Living Family/patient expects to be discharged to:: Private residence Living Arrangements: Alone;Children Available Help at Discharge: Family Type of Home: House Home Access: Stairs to enter;Level entry Home Layout: One level Home Equipment: Walker - 2 wheels;Grab bars - tub/shower Additional Comments: pt reports his son is going to stay with him a few days. Prior Function Level of Independence: Independent Communication Communication: No difficulties     Cognition  Cognition Arousal/Alertness: Awake/alert Behavior During Therapy: WFL for tasks assessed/performed    Extremity/Trunk Assessment Upper Extremity Assessment Upper Extremity Assessment: Overall WFL for tasks assessed Lower Extremity Assessment Lower Extremity Assessment: Overall WFL for tasks assessed Cervical / Trunk Assessment Cervical / Trunk Assessment: Normal   Balance Balance Overall balance assessment: No apparent balance deficits (not formally assessed)  End of Session PT - End of Session Activity Tolerance: Patient tolerated treatment well Patient left: in chair;with call bell/phone within reach;with chair alarm set Nurse Communication: Mobility status  GP Functional Assessment Tool Used: clinical judgement. Functional Limitation: Mobility: Walking and moving around Mobility: Walking and Moving Around Current Status [phone](G8978): 0 percent impaired, limited or restricted Mobility: Walking and Moving Around Goal Status [phone](G8979): 0 percent impaired, limited or restricted Mobility: Walking and Moving Around Discharge Status [phone](G8980): 0 percent impaired, limited or restricted   Rada HayHill, Khalen Styer Elizabeth 01/28/2014, 1:58 PM Blanchard KelchKaren Amol Domanski PT [phone]

## 2014-01-28 NOTE — Progress Notes (Signed)
UR completed 

## 2014-02-16 ENCOUNTER — Other Ambulatory Visit: Payer: Self-pay | Admitting: Nurse Practitioner

## 2014-02-16 NOTE — Telephone Encounter (Signed)
rx sent to pharmacy by [facility]. 

## 2014-02-16 NOTE — Telephone Encounter (Signed)
Is it okay to refill pt's Gabapentin 300 mg? Please advise. thanks

## 2014-02-24 ENCOUNTER — Other Ambulatory Visit: Payer: Self-pay | Admitting: Nurse Practitioner

## 2014-03-15 ENCOUNTER — Other Ambulatory Visit: Payer: Self-pay | Admitting: Internal Medicine

## 2014-04-09 ENCOUNTER — Encounter: Payer: Self-pay | Admitting: Nurse Practitioner

## 2014-04-09 ENCOUNTER — Ambulatory Visit (INDEPENDENT_AMBULATORY_CARE_PROVIDER_SITE_OTHER): Payer: Medicare Other | Admitting: Nurse Practitioner

## 2014-04-09 VITALS — BP 132/100 | HR 67 | Temp 97.4°F | Resp 20 | Ht 74.0 in | Wt 182.4 lb

## 2014-04-09 DIAGNOSIS — L0212 Furuncle of neck: Secondary | ICD-10-CM

## 2014-04-09 DIAGNOSIS — L0213 Carbuncle of neck: Secondary | ICD-10-CM

## 2014-04-09 MED ORDER — DOXYCYCLINE HYCLATE 100 MG PO TABS: 100.0000 mg | ORAL_TABLET | Freq: Two times a day (BID) | ORAL | Status: DC

## 2014-04-09 NOTE — Progress Notes (Signed)
Patient ID: Gary DadaKenneth E Rylee, male   DOB: 02/08/1941, 73 y.o.   MRN: [id number]    Allergies  Allergen Reactions  . Penicillins Rash    Chief Complaint  Patient presents with  . Acute Visit    HPI: Patient is a 73 y.o. male seen in the office today for place (boil)  on the side of his neck, has not done anything with it-- did not want to do but conts to get bigger. Painful and red. No fevers or chill  Has not had a boil before.  Review of Systems:  Review of Systems  Constitutional: Positive for weight loss (reports his weight fluctuates). Negative for fever, chills and malaise/fatigue.  Respiratory: Negative for shortness of breath.   Cardiovascular: Negative for chest pain and palpitations.  Skin:       Painful red boil on neck  Neurological: Negative for dizziness, tingling, weakness and headaches.     Past Medical History  Diagnosis Date  . Type II or unspecified type diabetes mellitus without mention of complication, uncontrolled   . DEMENTIA   . Neuromuscular disorder   . Hypertension   . Gout   . Arthritis   . Dementia 04/24/2012  . Unspecified hereditary and idiopathic peripheral neuropathy   . Anemia, unspecified   . Hypotension, unspecified   . Acute kidney failure, unspecified   . Unspecified vitamin D deficiency   . Other and unspecified hyperlipidemia   . Hypopotassemia   . Disorder of bone and cartilage, unspecified   . Memory loss   . Hypotension, unspecified   . Acute kidney failure, unspecified   . Muscle weakness (generalized)   . Dizziness and giddiness   . Myalgia and myositis, unspecified   . Screening for ischemic heart disease   . Type II or unspecified type diabetes mellitus without mention of complication, uncontrolled   . Gout, unspecified   . Tobacco use disorder   . Memory loss   . Special screening for malignant neoplasm of prostate    Past Surgical History  Procedure Laterality Date  . Inguinal hernia repair     Social History:  reports that he has never smoked. He has never used smokeless tobacco. He reports that he drinks alcohol. He reports that he does not use illicit drugs.  Family History  Problem Relation Age of Onset  . Diabetes Mother   . Hypertension Mother   . Diabetes Father   . Hypertension Father   . Kidney disease Father   . Diabetes Sister   . Kidney disease Brother     Medications: Patient's Medications  New Prescriptions   No medications on file  Previous Medications   ALLOPURINOL (ZYLOPRIM) 100 MG TABLET    TAKE 1 TABLET BY MOUTH EVERY DAY   AMLODIPINE (NORVASC) 10 MG TABLET    TAKE 1 TABLET BY MOUTH DAILY   FEEDING SUPPLEMENT, ENSURE COMPLETE, (ENSURE COMPLETE) LIQD    Take 237 mLs by mouth 2 (two) times daily between meals.   GABAPENTIN (NEURONTIN) 300 MG CAPSULE    TAKE ONE CAPSULE BY MOUTH TWICE DAILY AS NEEDED FOR NERVE PAIN   GLUCAGON (GLUCAGON EMERGENCY) 1 MG INJECTION    Inject 1 mg into the vein once as needed (low blood sugar and unable to take sugar by mouth).   GLUCOSE 4 GM CHEWABLE TABLET    Chew 1 tablet (4 g total) by mouth as needed for low blood sugar.   MULTIPLE VITAMIN (MULTIVITAMIN WITH MINERALS) TABS TABLET  Take 1 tablet by mouth daily.   NAMENDA 10 MG TABLET    TAKE 1 TABLET BY MOUTH TWICE DAILY   POTASSIUM CHLORIDE SA (K-DUR,KLOR-CON) 20 MEQ TABLET    TAKE 1 TABLET BY MOUTH TWICE DAILY  Modified Medications   No medications on file  Discontinued Medications   No medications on file     Physical Exam:  Filed Vitals:   04/09/14 0931  BP: 132/100  Pulse: 67  Temp: 97.4 F (36.3 C)  TempSrc: Oral  Resp: 20  Height: 6\' 2"  (1.88 m)  Weight: 182 lb 6.4 oz (82.736 kg)  SpO2: 99%   Physical Exam  Constitutional: He is oriented to person, place, and time and well-developed, well-nourished, and in no distress.  Eyes: Conjunctivae and EOM are normal. Pupils are equal, round, and reactive to light.  Neck: Normal range of motion. Neck supple. No thyromegaly  present.  Cardiovascular: Normal rate, regular rhythm and normal heart sounds.   Pulmonary/Chest: Effort normal and breath sounds normal. No respiratory distress.  Lymphadenopathy:    He has no cervical adenopathy.  Neurological: He is alert and oriented to person, place, and time.  Skin: Skin is warm, dry and intact. Lesion noted. No rash noted.        Labs reviewed: Basic Metabolic Panel:  Recent Labs  16/08/9601/02/15 1549 01/27/14 1100 01/28/14 0510  NA 143 142 134*  K 3.7 3.6* 3.3*  CL 101 105 98  CO2 25 19 21   GLUCOSE 105* 35* 145*  BUN 17 15 7   CREATININE 1.01 0.66 0.67  CALCIUM 9.5 8.8 8.8   Liver Function Tests:  Recent Labs  12/18/13 1659 01/27/14 1100 01/28/14 0900  AST 27 200* 108*  ALT 14 122* 100*  ALKPHOS 65 92 126*  BILITOT 0.5 0.7 1.6*  PROT 7.1 7.0 7.6  ALBUMIN  --  4.0 4.4   No results found for this basename: LIPASE, AMYLASE,  in the last 8760 hours No results found for this basename: AMMONIA,  in the last 8760 hours CBC:  Recent Labs  12/18/13 1659 01/27/14 1100 01/28/14 0510  WBC 4.2 7.1 5.5  NEUTROABS 2.4  --   --   HGB 12.7 14.1 14.1  HCT 35.5* 38.3* 39.1  MCV 91 87.8 89.5  PLT 150 154 125*     Assessment/Plan 1. Furuncle of neck -area sprayed with numbing spray, cleaned with betadine and small incision made, purulent drainage immediately expressed, pt tolerated well, given instructions on home care and to take antibiotics twice daily until finished -hibiclens to clean area daily -to keep area clean and dry - doxycycline (VIBRA-TABS) 100 MG tablet; Take 1 tablet (100 mg total) by mouth 2 (two) times daily.  Dispense: 20 tablet; Refill: 0 -return precautions given, pt understands

## 2014-04-09 NOTE — Patient Instructions (Addendum)
  Doxycycline called into pharmacy for you to take for 10 days Use warm compresses twice daily  May use Hibiclens to clean daily  Abscess An abscess is an infected area that contains a collection of pus and debris.It can occur in almost any part of the body. An abscess is also known as a furuncle or boil. CAUSES  An abscess occurs when tissue gets infected. This can occur from blockage of oil or sweat glands, infection of hair follicles, or a minor injury to the skin. As the body tries to fight the infection, pus collects in the area and creates pressure under the skin. This pressure causes pain. People with weakened immune systems have difficulty fighting infections and get certain abscesses more often.  SYMPTOMS Usually an abscess develops on the skin and becomes a painful mass that is red, warm, and tender. If the abscess forms under the skin, you may feel a moveable soft area under the skin. Some abscesses break open (rupture) on their own, but most will continue to get worse without care. The infection can spread deeper into the body and eventually into the bloodstream, causing you to feel ill.  DIAGNOSIS  Your caregiver will take your medical history and perform a physical exam. A sample of fluid may also be taken from the abscess to determine what is causing your infection. TREATMENT  Your caregiver may prescribe antibiotic medicines to fight the infection. However, taking antibiotics alone usually does not cure an abscess. Your caregiver may need to make a small cut (incision) in the abscess to drain the pus. In some cases, gauze is packed into the abscess to reduce pain and to continue draining the area. HOME CARE INSTRUCTIONS   Only take over-the-counter or prescription medicines for pain, discomfort, or fever as directed by your caregiver.  If you were prescribed antibiotics, take them as directed. Finish them even if you start to feel better.  If gauze is used, follow your  caregiver's directions for changing the gauze.  To avoid spreading the infection:  Keep your draining abscess covered with a bandage.  Wash your hands well.  Do not share personal care items, towels, or whirlpools with others.  Avoid skin contact with others.  Keep your skin and clothes clean around the abscess.  Keep all follow-up appointments as directed by your caregiver. SEEK MEDICAL CARE IF:   You have increased pain, swelling, redness, fluid drainage, or bleeding.  You have muscle aches, chills, or a general ill feeling.  You have a fever. MAKE SURE YOU:   Understand these instructions.  Will watch your condition.  Will get help right away if you are not doing well or get worse. Document Released: 08/16/2005 Document Revised: 05/07/2012 Document Reviewed: 01/19/2012 [facility] Patient Information 2014 [place] [place].

## 2014-04-25 ENCOUNTER — Other Ambulatory Visit: Payer: Self-pay | Admitting: Nurse Practitioner

## 2014-04-29 ENCOUNTER — Ambulatory Visit (INDEPENDENT_AMBULATORY_CARE_PROVIDER_SITE_OTHER): Payer: Medicare Other | Admitting: Internal Medicine

## 2014-04-29 ENCOUNTER — Encounter: Payer: Self-pay | Admitting: Internal Medicine

## 2014-04-29 VITALS — BP 138/84 | HR 84 | Temp 98.1°F | Wt 183.2 lb

## 2014-04-29 DIAGNOSIS — I10 Essential (primary) hypertension: Secondary | ICD-10-CM

## 2014-04-29 DIAGNOSIS — E785 Hyperlipidemia, unspecified: Secondary | ICD-10-CM

## 2014-04-29 DIAGNOSIS — R609 Edema, unspecified: Secondary | ICD-10-CM | POA: Insufficient documentation

## 2014-04-29 DIAGNOSIS — F0390 Unspecified dementia without behavioral disturbance: Secondary | ICD-10-CM

## 2014-04-29 DIAGNOSIS — E119 Type 2 diabetes mellitus without complications: Secondary | ICD-10-CM

## 2014-04-29 DIAGNOSIS — M109 Gout, unspecified: Secondary | ICD-10-CM

## 2014-04-29 MED ORDER — HYDROCHLOROTHIAZIDE 25 MG PO TABS: 25.0000 mg | ORAL_TABLET | Freq: Every day | ORAL | Status: DC

## 2014-04-29 MED ORDER — GABAPENTIN 300 MG PO CAPS: ORAL_CAPSULE | ORAL | Status: DC

## 2014-04-29 NOTE — Progress Notes (Signed)
Patient ID: Gary Mcneil, male   DOB: 1941/07/24, 73 y.o.   MRN: [id number]    Chief Complaint  Patient presents with  . Foot Swelling    Swelling in feet x 3 weeks   . Medication Management   Allergies  Allergen Reactions  . Penicillins Rash   HPI 73 y/o male patient is here for acute visit. I have not seen him in 2 years. He has seen our NP recently for acute visit.He has hx of dm, htn, dementia, gout among others. He has ran out of several medication for few weeks and also has been having swelling in both his feet that increases towards end of the days for 2-3 weeks. Denies dyspnea, orthopnea or chest pain. He is alert and oriented, lives by himself and is independent with his activities. No recent lab work. Has sedentary lifestyle and has gained 13 pounds since march 2015  Review of Systems  Constitutional: Negative for fever, chills, malaise/fatigue and diaphoresis.  HENT: Negative for congestion, hearing loss and sore throat.   Eyes: Negative for blurred vision, double vision and discharge.  Respiratory: Negative for cough, sputum production, shortness of breath and wheezing.   Cardiovascular: Negative for chest pain, palpitations, orthopnea  Gastrointestinal: Negative for heartburn, nausea, vomiting, abdominal pain  Genitourinary: Negative for dysuria, urgency, frequency and flank pain.  Musculoskeletal: Negative for back pain, falls, joint pain and myalgias.  Skin: Negative for itching and rash.  Neurological: Negative for dizziness, tingling, focal weakness and headaches.  Psychiatric/Behavioral: Negative for depression.The patient is not nervous/anxious.    Past Medical History  Diagnosis Date  . Type II or unspecified type diabetes mellitus without mention of complication, uncontrolled   . DEMENTIA   . Neuromuscular disorder   . Hypertension   . Gout   . Arthritis   . Dementia 04/24/2012  . Unspecified hereditary and idiopathic peripheral neuropathy   . Anemia,  unspecified   . Hypotension, unspecified   . Acute kidney failure, unspecified   . Unspecified vitamin D deficiency   . Other and unspecified hyperlipidemia   . Hypopotassemia   . Disorder of bone and cartilage, unspecified   . Memory loss   . Hypotension, unspecified   . Acute kidney failure, unspecified   . Muscle weakness (generalized)   . Dizziness and giddiness   . Myalgia and myositis, unspecified   . Screening for ischemic heart disease   . Type II or unspecified type diabetes mellitus without mention of complication, uncontrolled   . Gout, unspecified   . Tobacco use disorder   . Memory loss   . Special screening for malignant neoplasm of prostate    Medication reviewed. See [facility]  Physical exam BP 138/84  Pulse 84  Temp(Src) 98.1 F (36.7 C) (Oral)  Wt 183 lb 3.2 oz (83.099 kg)  SpO2 98%  General- elderly male in no acute distress Head- atraumatic, normocephalic Eyes- PERRLA, EOMI, no pallor, no icterus Neck- no lymphadenopathy, no thyromegaly, no jugular vein distension Cardiovascular- normal s1,s2, no murmurs/ rubs/ gallops, intact distal pulses Respiratory- bilateral clear to auscultation, no wheeze, no rhonchi, no crackles Abdomen- bowel sounds present, soft, non tender Musculoskeletal- able to move all 4 extremities, no spinal and paraspinal tenderness, steady gait, no use of assistive device, 1+ edema in both legs, pitting in nature Neurological- no focal deficit Psychiatry- alert and oriented to person, place and time, normal mood and affect  Labs- Lab Results  Component Value Date   WBC 5.5 01/28/2014  HGB 14.1 01/28/2014   HCT 39.1 01/28/2014   MCV 89.5 01/28/2014   PLT 125* 01/28/2014   CMP     Component Value Date/Time   NA 134* 01/28/2014 0510   NA 143 12/22/2013 1549   K 3.3* 01/28/2014 0510   CL 98 01/28/2014 0510   CO2 21 01/28/2014 0510   GLUCOSE 145* 01/28/2014 0510   GLUCOSE 105* 12/22/2013 1549   BUN 7 01/28/2014 0510   BUN 17 12/22/2013 1549    CREATININE 0.67 01/28/2014 0510   CALCIUM 8.8 01/28/2014 0510   PROT 7.6 01/28/2014 0900   PROT 7.1 12/18/2013 1659   ALBUMIN 4.4 01/28/2014 0900   AST 108* 01/28/2014 0900   ALT 100* 01/28/2014 0900   ALKPHOS 126* 01/28/2014 0900   BILITOT 1.6* 01/28/2014 0900   GFRNONAA >90 01/28/2014 0510   GFRAA >90 01/28/2014 0510   Lab Results  Component Value Date   HGBA1C 6.6* 12/18/2013   Lipid Panel  No results found for this basename: chol, trig, hdl, cholhdl, vldl, ldlcalc   Assessment/plan  1. HTN (hypertension) Will have him on HCTZ 25 mg daily to help with bp and swelling in his legs. Stop amlodipine (has not been taking it for few weeks). Common side effect explained - CMP - CBC with Differential - Lipid Panel  2. Dementia Continue namenda 10 mg bid for now. Repeat mmse in next office visit and change to namenda xr sand add another agent if indicated  3. DM (diabetes mellitus) a1c in jan at goal. Off metformin for some time. Recheck a1c today and treat for goal a1c of < 7 - Hemoglobin A1c  4. Edema hctz 25 mg daily should help with his edema- likely from venous pooling  5. Gout Check uric acid level. Off allopurinol for now - Uric Acid  6. Hyperlipidemia Not on any statin. Check lipid panel - Lipid Panel

## 2014-05-03 ENCOUNTER — Other Ambulatory Visit: Payer: Self-pay | Admitting: Internal Medicine

## 2014-05-20 ENCOUNTER — Ambulatory Visit (INDEPENDENT_AMBULATORY_CARE_PROVIDER_SITE_OTHER): Payer: Medicare Other | Admitting: Internal Medicine

## 2014-05-20 ENCOUNTER — Encounter: Payer: Self-pay | Admitting: Internal Medicine

## 2014-05-20 VITALS — BP 120/82 | HR 70 | Temp 98.0°F | Wt 189.0 lb

## 2014-05-20 DIAGNOSIS — E785 Hyperlipidemia, unspecified: Secondary | ICD-10-CM

## 2014-05-20 DIAGNOSIS — R609 Edema, unspecified: Secondary | ICD-10-CM

## 2014-05-20 DIAGNOSIS — F1020 Alcohol dependence, uncomplicated: Secondary | ICD-10-CM

## 2014-05-20 DIAGNOSIS — E1142 Type 2 diabetes mellitus with diabetic polyneuropathy: Secondary | ICD-10-CM

## 2014-05-20 DIAGNOSIS — I10 Essential (primary) hypertension: Secondary | ICD-10-CM

## 2014-05-20 DIAGNOSIS — E1149 Type 2 diabetes mellitus with other diabetic neurological complication: Secondary | ICD-10-CM

## 2014-05-20 DIAGNOSIS — E1140 Type 2 diabetes mellitus with diabetic neuropathy, unspecified: Secondary | ICD-10-CM | POA: Insufficient documentation

## 2014-05-20 DIAGNOSIS — L600 Ingrowing nail: Secondary | ICD-10-CM

## 2014-05-20 DIAGNOSIS — E876 Hypokalemia: Secondary | ICD-10-CM

## 2014-05-20 DIAGNOSIS — F0390 Unspecified dementia without behavioral disturbance: Secondary | ICD-10-CM

## 2014-05-20 MED ORDER — FUROSEMIDE 20 MG PO TABS: 20.0000 mg | ORAL_TABLET | Freq: Every day | ORAL | Status: DC

## 2014-05-20 MED ORDER — POTASSIUM CHLORIDE CRYS ER 20 MEQ PO TBCR: 20.0000 meq | EXTENDED_RELEASE_TABLET | Freq: Every day | ORAL | Status: DC

## 2014-05-20 NOTE — Progress Notes (Signed)
Passed clock drawing 

## 2014-05-20 NOTE — Progress Notes (Signed)
Patient ID: Gary Mcneil, male   DOB: 08/09/1941, 73 y.o.   MRN: [id number]    Chief Complaint  Patient presents with  . Medical Management of Chronic Issues    edema, blood pressure, blood sugar   Allergies  Allergen Reactions  . Penicillins Rash   HPI 73 y/o male patient is here for follow up on his lab and swelling with BP. He has not had his blood work done. His swelling persists and on review of his weight, has gained 5 lbs since last visit. He mentions that he does not eat much but when he eats, he takes sausage and bacon with bread and eggs. He is taking alcohol on daily basis- 2-3 whiskey and soda pop.  He has history of HTN, DM not on any medication, neuropathic pain and dementia.  Denies dyspnea, orthopnea or chest pain.  He is alert and oriented, lives by himself and is independent with his activities. No recent lab work. Has sedentary lifestyle   Review of Systems  Constitutional: Negative for fever, chills, malaise/fatigue and diaphoresis.  HENT: Negative for congestion, hearing loss and sore throat.   Eyes: Negative for blurred vision, double vision and discharge.  Respiratory: Negative for cough, sputum production, shortness of breath and wheezing.   Cardiovascular: Negative for chest pain, palpitations, orthopnea. Has leg edema Gastrointestinal: Negative for heartburn, nausea, vomiting, abdominal pain  Genitourinary: Negative for dysuria, urgency, frequency and flank pain.  Musculoskeletal: Negative for back pain, falls, joint pain and myalgias.  Skin: Negative for itching and rash.  Neurological: Negative for dizziness, tingling, focal weakness and headaches.  Psychiatric/Behavioral: Negative for depression.The patient appears more forgetful   Wt Readings from Last 3 Encounters:  05/20/14 189 lb (85.73 kg)  04/29/14 183 lb 3.2 oz (83.099 kg)  04/09/14 182 lb 6.4 oz (82.736 kg)   Past Medical History  Diagnosis Date  . Type II or unspecified type diabetes  mellitus without mention of complication, uncontrolled   . DEMENTIA   . Neuromuscular disorder   . Hypertension   . Gout   . Arthritis   . Dementia 04/24/2012  . Unspecified hereditary and idiopathic peripheral neuropathy   . Anemia, unspecified   . Hypotension, unspecified   . Acute kidney failure, unspecified   . Unspecified vitamin D deficiency   . Other and unspecified hyperlipidemia   . Hypopotassemia   . Disorder of bone and cartilage, unspecified   . Memory loss   . Hypotension, unspecified   . Acute kidney failure, unspecified   . Muscle weakness (generalized)   . Dizziness and giddiness   . Myalgia and myositis, unspecified   . Screening for ischemic heart disease   . Type II or unspecified type diabetes mellitus without mention of complication, uncontrolled   . Gout, unspecified   . Tobacco use disorder   . Memory loss   . Special screening for malignant neoplasm of prostate    Current Outpatient Prescriptions on File Prior to Visit  Medication Sig Dispense Refill  . feeding supplement, ENSURE COMPLETE, (ENSURE COMPLETE) LIQD Take 237 mLs by mouth 2 (two) times daily between meals.  60 Bottle  0  . gabapentin (NEURONTIN) 300 MG capsule TAKE ONE CAPSULE BY MOUTH TWICE DAILY AS NEEDED FOR NERVE PAIN  60 capsule  0  . glucagon (GLUCAGON EMERGENCY) 1 MG injection Inject 1 mg into the vein once as needed (low blood sugar and unable to take sugar by mouth).  1 each  0  . glucose  4 GM chewable tablet Chew 1 tablet (4 g total) by mouth as needed for low blood sugar.  50 tablet  0  . hydrochlorothiazide (HYDRODIURIL) 25 MG tablet Take 1 tablet (25 mg total) by mouth daily.  30 tablet  3  . Multiple Vitamin (MULTIVITAMIN WITH MINERALS) TABS tablet Take 1 tablet by mouth daily.      [name] NAMENDA 10 MG tablet TAKE 1 TABLET BY MOUTH TWICE DAILY  60 tablet  5   No current facility-administered medications on file prior to visit.    Physical exam BP 120/82  Pulse 70  Temp(Src) 98 F  (36.7 C) (Oral)  Wt 189 lb (85.73 kg)  SpO2 97%  General- elderly male in no acute distress Head- atraumatic, normocephalic Eyes- PERRLA, EOMI, no pallor, no icterus Neck- no lymphadenopathy, no thyromegaly, no jugular vein distension Cardiovascular- normal s1,s2, no murmurs/ rubs/ gallops, intact distal pulses, 2+ edema in both feet upto legs (below knee), pitting in nature Respiratory- bilateral clear to auscultation, no wheeze, no rhonchi, no crackles Abdomen- bowel sounds present, soft, non tender Musculoskeletal- able to move all 4 extremities, no spinal and paraspinal tenderness, steady gait, no use of assistive device, ingrown toe nail Neurological- no focal deficit, normal pinprick sensation, normal vibration, normal reflexes Psychiatry- alert and oriented to person, and place, normal mood and affect  Labs- Lab Results  Component Value Date   HGBA1C 6.6* 12/18/2013   Lab Results  Component Value Date   WBC 5.5 01/28/2014   HGB 14.1 01/28/2014   HCT 39.1 01/28/2014   MCV 89.5 01/28/2014   PLT 125* 01/28/2014   CMP     Component Value Date/Time   NA 134* 01/28/2014 0510   NA 143 12/22/2013 1549   K 3.3* 01/28/2014 0510   CL 98 01/28/2014 0510   CO2 21 01/28/2014 0510   GLUCOSE 145* 01/28/2014 0510   GLUCOSE 105* 12/22/2013 1549   BUN 7 01/28/2014 0510   BUN 17 12/22/2013 1549   CREATININE 0.67 01/28/2014 0510   CALCIUM 8.8 01/28/2014 0510   PROT 7.6 01/28/2014 0900   PROT 7.1 12/18/2013 1659   ALBUMIN 4.4 01/28/2014 0900   AST 108* 01/28/2014 0900   ALT 100* 01/28/2014 0900   ALKPHOS 126* 01/28/2014 0900   BILITOT 1.6* 01/28/2014 0900   GFRNONAA >90 01/28/2014 0510   GFRAA >90 01/28/2014 0510   05/20/14 mmse 25/30  03/27/12 mmse 27/30  Assessment/plan  1. Essential hypertension Stable. Continue hydrochlorthiazide 25 mg daily - CMP - CBC with Differential - Uric Acid  2. Type 2 diabetes mellitus with diabetic neuropathy No recent a1c and not on any medication. Check a1c, cmp and  urine microalbumin. Consider ACEI/ARB if indicated. Will need podiatry referral - CMP - Hemoglobin A1c - Microalbumin/Creatinine Ratio, Urine  3. Dementia, without behavioral disturbance Has worsened. Continue namenda 10 mg bid for now- pt does not want to change into anything else as he picked meds recently. Check for reversible causes. His vascular problem and intake of alcohol could both be contributing to this. Encouraged to stop alcohol intake - TSH - Vitamin B12 - Uric Acid  4. Edema Concern for liver pathology- check lft. On hctz already. Will have him on lasix 20 mg daily and kcl 20 meq daily. Check Prealbumin to assess for malnutrition.   5. Hyperlipidemia Not on any medication for this. Check lipid panel - Lipid Panel  6. Hypokalemia  check bmp. Will add kcl supplement with him starting lasix for now  7. Uncomplicated alcohol dependence Check nutrtional state with b12 and folic acid level. counselled on alcohol cessation, pt not willing - Vitamin B12 - Prealbumin

## 2014-05-21 LAB — CBC WITH DIFFERENTIAL/PLATELET
BASOS ABS: 0 10*3/uL (ref 0.0–0.2)
BASOS: 0 %
EOS ABS: 0.3 10*3/uL (ref 0.0–0.4)
Eos: 6 %
HCT: 39.1 % (ref 37.5–51.0)
HEMOGLOBIN: 14.2 g/dL (ref 12.6–17.7)
Immature Grans (Abs): 0 10*3/uL (ref 0.0–0.1)
Immature Granulocytes: 0 %
LYMPHS ABS: 1.6 10*3/uL (ref 0.7–3.1)
Lymphs: 31 %
MCH: 31.1 pg (ref 26.6–33.0)
MCHC: 36.3 g/dL — AB (ref 31.5–35.7)
MCV: 86 fL (ref 79–97)
Monocytes Absolute: 0.3 10*3/uL (ref 0.1–0.9)
Monocytes: 6 %
NEUTROS ABS: 2.9 10*3/uL (ref 1.4–7.0)
Neutrophils Relative %: 57 %
RBC: 4.56 x10E6/uL (ref 4.14–5.80)
RDW: 13.2 % (ref 12.3–15.4)
WBC: 5.1 10*3/uL (ref 3.4–10.8)

## 2014-05-21 LAB — COMPREHENSIVE METABOLIC PANEL
ALK PHOS: 72 IU/L (ref 39–117)
ALT: 34 IU/L (ref 0–44)
AST: 33 IU/L (ref 0–40)
Albumin/Globulin Ratio: 2.2 (ref 1.1–2.5)
Albumin: 4.7 g/dL (ref 3.5–4.8)
BUN / CREAT RATIO: 12 (ref 10–22)
BUN: 11 mg/dL (ref 8–27)
CHLORIDE: 101 mmol/L (ref 97–108)
CO2: 21 mmol/L (ref 18–29)
Calcium: 9.2 mg/dL (ref 8.6–10.2)
Creatinine, Ser: 0.91 mg/dL (ref 0.76–1.27)
GFR calc Af Amer: 98 mL/min/{1.73_m2} (ref >59)
GFR calc non Af Amer: 84 mL/min/{1.73_m2} (ref >59)
Globulin, Total: 2.1 g/dL (ref 1.5–4.5)
Glucose: 129 mg/dL — ABNORMAL HIGH (ref 65–99)
Potassium: 2.9 mmol/L — ABNORMAL LOW (ref 3.5–5.2)
SODIUM: 142 mmol/L (ref 134–144)
Total Bilirubin: 0.6 mg/dL (ref 0.0–1.2)
Total Protein: 6.8 g/dL (ref 6.0–8.5)

## 2014-05-21 LAB — HEMOGLOBIN A1C
Est. average glucose Bld gHb Est-mCnc: 137 mg/dL
Hgb A1c MFr Bld: 6.4 % — ABNORMAL HIGH (ref 4.8–5.6)

## 2014-05-21 LAB — FOLATE

## 2014-05-21 LAB — LIPID PANEL
CHOLESTEROL TOTAL: 251 mg/dL — AB (ref 100–199)
Chol/HDL Ratio: 4.1 ratio units (ref 0.0–5.0)
HDL: 61 mg/dL (ref >39)
LDL Calculated: 151 mg/dL — ABNORMAL HIGH (ref 0–99)
TRIGLYCERIDES: 197 mg/dL — AB (ref 0–149)
VLDL Cholesterol Cal: 39 mg/dL (ref 5–40)

## 2014-05-21 LAB — VITAMIN B12: VITAMIN B 12: 466 pg/mL (ref 211–946)

## 2014-05-21 LAB — URIC ACID: Uric Acid: 12.1 mg/dL — ABNORMAL HIGH (ref 3.7–8.6)

## 2014-05-21 LAB — PREALBUMIN: Prealbumin: 39 mg/dL (ref 20–40)

## 2014-05-21 LAB — TSH: TSH: 2.65 u[IU]/mL (ref 0.450–4.500)

## 2014-05-29 ENCOUNTER — Encounter: Payer: Self-pay | Admitting: *Deleted

## 2014-06-04 ENCOUNTER — Ambulatory Visit: Payer: Self-pay | Admitting: Nurse Practitioner

## 2014-07-14 ENCOUNTER — Other Ambulatory Visit: Payer: Self-pay | Admitting: *Deleted

## 2014-07-14 DIAGNOSIS — IMO0001 Reserved for inherently not codable concepts without codable children: Secondary | ICD-10-CM

## 2014-07-14 DIAGNOSIS — E1165 Type 2 diabetes mellitus with hyperglycemia: Principal | ICD-10-CM

## 2014-07-22 ENCOUNTER — Other Ambulatory Visit: Payer: Self-pay | Admitting: Internal Medicine

## 2014-07-22 ENCOUNTER — Other Ambulatory Visit: Payer: Self-pay | Admitting: Nurse Practitioner

## 2014-07-28 ENCOUNTER — Telehealth: Payer: Self-pay | Admitting: *Deleted

## 2014-07-28 NOTE — Telephone Encounter (Signed)
Pt needs to be seen in clinic. Schedule him with me or Shanda Bumps

## 2014-07-28 NOTE — Telephone Encounter (Signed)
Patient son, Jaxzen stated that patient is having Hallucinations with the new medications prescribed. He got lost a couple of times this weekend driving. Son doesn't know what to do? Should something be changed with the medications? Please Advise.

## 2014-07-29 NOTE — Telephone Encounter (Signed)
Appointment scheduled for tomorrow 9/10 with Shanda Bumps

## 2014-07-30 ENCOUNTER — Ambulatory Visit: Payer: Self-pay | Admitting: Nurse Practitioner

## 2014-08-05 ENCOUNTER — Ambulatory Visit: Payer: Self-pay | Admitting: Internal Medicine

## 2014-08-12 ENCOUNTER — Encounter: Payer: Self-pay | Admitting: Internal Medicine

## 2014-08-12 ENCOUNTER — Ambulatory Visit (INDEPENDENT_AMBULATORY_CARE_PROVIDER_SITE_OTHER): Payer: Medicare Other | Admitting: Internal Medicine

## 2014-08-12 VITALS — BP 130/88 | HR 91 | Temp 98.0°F | Resp 10 | Wt 178.0 lb

## 2014-08-12 DIAGNOSIS — R443 Hallucinations, unspecified: Secondary | ICD-10-CM

## 2014-08-12 LAB — POCT URINALYSIS DIPSTICK
Bilirubin, UA: NEGATIVE
Blood, UA: NEGATIVE
Glucose, UA: NEGATIVE
KETONES UA: NEGATIVE
Leukocytes, UA: NEGATIVE
Nitrite, UA: NEGATIVE
PH UA: 6.5
PROTEIN UA: NEGATIVE
SPEC GRAV UA: 1.015
Urobilinogen, UA: 0.2

## 2014-08-12 MED ORDER — MEMANTINE HCL ER 28 MG PO CP24: 1.0000 | ORAL_CAPSULE | Freq: Every day | ORAL | Status: DC

## 2014-08-12 MED ORDER — GABAPENTIN 300 MG PO CAPS: 300.0000 mg | ORAL_CAPSULE | Freq: Every day | ORAL | Status: DC

## 2014-08-12 NOTE — Progress Notes (Signed)
Patient ID: Gary Mcneil, male   DOB: [birth year], 73 y.o.   MRN: [id number]    Chief Complaint  Patient presents with  . Acute Visit    Hallucinations    Allergies  Allergen Reactions  . Penicillins Rash   HPI 73 y/o male patient is here with his daughter for acute concerns He got out of a football game and did not know where he was. Another day he got into car thing he was going somewhere nearby to eat but ended up in [place] from [place]. These episodes happened 2 days in a row 2 weeks back He told his daughter that there was someone outside the window watching him and went outside the house to look for this person. His daughter was in the house with him that day and did not see anyone and told him there was no one and he said "you are crazy" He was found to have low blood sugar both these episodes. He has hx of dm but not on any med, diet controlled for now. Daughter not sure if he had skipped his meals He has been more forgetful recently Daughter is concerned about this with him living by himself He is taking alcohol on daily basis- 2-3 whiskey and soda pop.   He has history of HTN, DM with neuropathic pain and dementia.   He is alert and oriented today   Review of Systems   Constitutional: Negative for fever, chills, malaise/fatigue and diaphoresis.   HENT: Negative for congestion, hearing loss and sore throat.    Eyes: Negative for blurred vision, double vision and discharge.   Respiratory: Negative for cough, sputum production, shortness of breath and wheezing.    Cardiovascular: Negative for chest pain, palpitations Gastrointestinal: Negative for heartburn, nausea, vomiting, abdominal pain   Genitourinary: Negative for dysuria Musculoskeletal: Negative for back pain, falls Skin: Negative for itching and rash.   Neurological: Negative for dizziness, tingling, focal weakness and headaches.   Psychiatric/Behavioral: Negative for depression.The patient appears more  forgetful  Current Outpatient Prescriptions on File Prior to Visit  Medication Sig Dispense Refill  . glucose 4 GM chewable tablet Chew 1 tablet (4 g total) by mouth as needed for low blood sugar.  50 tablet  0  . hydrochlorothiazide (HYDRODIURIL) 25 MG tablet Take 1 tablet (25 mg total) by mouth daily.  30 tablet  3  . Multiple Vitamin (MULTIVITAMIN WITH MINERALS) TABS tablet Take 1 tablet by mouth daily.       No current facility-administered medications on file prior to visit.   Physical exam BP 130/88  Pulse 91  Temp(Src) 98 F (36.7 C) (Oral)  Resp 10  Wt 178 lb (80.74 kg)  SpO2 97%  General- elderly male in no acute distress Head- atraumatic, normocephalic Eyes- PERRLA, EOMI, no pallor, no icterus, no nystagmus Neck- no lymphadenopathy, no thyromegaly, no jugular vein distension Cardiovascular- normal s1,s2, no murmurs, intact distal pulses Respiratory- bilateral clear to auscultation, no wheeze, no rhonchi, no crackles Abdomen- bowel sounds present, soft, non tender Musculoskeletal- able to move all 4 extremities, no spinal and paraspinal tenderness, steady gait, no use of assistive device Neurological- no focal deficit Psychiatry- alert and oriented   Lab Results  Component Value Date   HGBA1C 6.4* 05/20/2014    Assessment/plan  1. Hallucination No active hallucination at present. Rule out uti. Check cbc with diff to rule out infection. Also rule out thyroid problem and lyte abnormality. Has dementia and this could be progressing and  contributing to this. Once infection is ruled out, if hallucination persists, will get further work up. Continue namenda - CMP - CBC with Differential - TSH - POCT urinalysis dipstick

## 2014-08-13 LAB — CBC WITH DIFFERENTIAL/PLATELET
BASOS ABS: 0 10*3/uL (ref 0.0–0.2)
BASOS: 0 %
Eos: 3 %
Eosinophils Absolute: 0.1 10*3/uL (ref 0.0–0.4)
HCT: 41.8 % (ref 37.5–51.0)
HEMOGLOBIN: 15.1 g/dL (ref 12.6–17.7)
Immature Grans (Abs): 0 10*3/uL (ref 0.0–0.1)
Immature Granulocytes: 0 %
LYMPHS ABS: 1.6 10*3/uL (ref 0.7–3.1)
LYMPHS: 29 %
MCH: 30.9 pg (ref 26.6–33.0)
MCHC: 36.1 g/dL — AB (ref 31.5–35.7)
MCV: 86 fL (ref 79–97)
MONOS ABS: 0.4 10*3/uL (ref 0.1–0.9)
Monocytes: 7 %
NEUTROS ABS: 3.4 10*3/uL (ref 1.4–7.0)
Neutrophils Relative %: 61 %
RBC: 4.88 x10E6/uL (ref 4.14–5.80)
RDW: 13.5 % (ref 12.3–15.4)
WBC: 5.6 10*3/uL (ref 3.4–10.8)

## 2014-08-13 LAB — COMPREHENSIVE METABOLIC PANEL
A/G RATIO: 1.9 (ref 1.1–2.5)
ALBUMIN: 4.6 g/dL (ref 3.5–4.8)
ALT: 51 IU/L — ABNORMAL HIGH (ref 0–44)
AST: 44 IU/L — ABNORMAL HIGH (ref 0–40)
Alkaline Phosphatase: 83 IU/L (ref 39–117)
BUN / CREAT RATIO: 17 (ref 10–22)
BUN: 17 mg/dL (ref 8–27)
CALCIUM: 9.6 mg/dL (ref 8.6–10.2)
CO2: 30 mmol/L — ABNORMAL HIGH (ref 18–29)
Chloride: 94 mmol/L — ABNORMAL LOW (ref 97–108)
Creatinine, Ser: 1 mg/dL (ref 0.76–1.27)
GFR calc Af Amer: 87 mL/min/{1.73_m2} (ref >59)
GFR, EST NON AFRICAN AMERICAN: 75 mL/min/{1.73_m2} (ref >59)
GLOBULIN, TOTAL: 2.4 g/dL (ref 1.5–4.5)
Glucose: 196 mg/dL — ABNORMAL HIGH (ref 65–99)
Potassium: 2.6 mmol/L — ABNORMAL LOW (ref 3.5–5.2)
SODIUM: 141 mmol/L (ref 134–144)
Total Bilirubin: 0.4 mg/dL (ref 0.0–1.2)
Total Protein: 7 g/dL (ref 6.0–8.5)

## 2014-08-13 LAB — TSH: TSH: 2.23 u[IU]/mL (ref 0.450–4.500)

## 2014-08-14 ENCOUNTER — Other Ambulatory Visit: Payer: Self-pay

## 2014-08-14 DIAGNOSIS — E876 Hypokalemia: Secondary | ICD-10-CM

## 2014-08-14 MED ORDER — AMBULATORY NON FORMULARY MEDICATION: Status: DC

## 2014-08-20 ENCOUNTER — Ambulatory Visit: Payer: Medicare Other | Admitting: Nurse Practitioner

## 2014-08-25 ENCOUNTER — Ambulatory Visit: Payer: Medicare Other | Admitting: Internal Medicine

## 2014-08-25 DIAGNOSIS — Z0289 Encounter for other administrative examinations: Secondary | ICD-10-CM

## 2014-09-02 ENCOUNTER — Encounter: Payer: Self-pay | Admitting: Internal Medicine

## 2014-09-02 ENCOUNTER — Ambulatory Visit (INDEPENDENT_AMBULATORY_CARE_PROVIDER_SITE_OTHER): Payer: Medicare Other | Admitting: Internal Medicine

## 2014-09-02 VITALS — BP 128/78 | HR 93 | Temp 97.4°F | Resp 18 | Ht 74.0 in | Wt 176.8 lb

## 2014-09-02 DIAGNOSIS — F0390 Unspecified dementia without behavioral disturbance: Secondary | ICD-10-CM

## 2014-09-02 DIAGNOSIS — E1140 Type 2 diabetes mellitus with diabetic neuropathy, unspecified: Secondary | ICD-10-CM

## 2014-09-02 DIAGNOSIS — L602 Onychogryphosis: Secondary | ICD-10-CM

## 2014-09-02 DIAGNOSIS — E876 Hypokalemia: Secondary | ICD-10-CM

## 2014-09-02 DIAGNOSIS — Z23 Encounter for immunization: Secondary | ICD-10-CM

## 2014-09-02 DIAGNOSIS — M1990 Unspecified osteoarthritis, unspecified site: Secondary | ICD-10-CM

## 2014-09-02 DIAGNOSIS — I10 Essential (primary) hypertension: Secondary | ICD-10-CM

## 2014-09-02 MED ORDER — GABAPENTIN 300 MG PO CAPS: 300.0000 mg | ORAL_CAPSULE | Freq: Three times a day (TID) | ORAL | Status: DC

## 2014-09-02 NOTE — Progress Notes (Signed)
Patient ID: Gary DadaKenneth E Mcneil, male   DOB: 01/06/1941, 73 y.o.   MRN: [id number]    Chief Complaint  Patient presents with  . Medical Management of Chronic Issues   Allergies  Allergen Reactions  . Penicillins Rash   HPI Patient here for routine follow up. He has dementia and HTN. No further hallucinations. His dementia has progressed and he has been confused about his medications. His daughter is present here and went over the medications wit her. He has occasional pain in left hip, no other complaints Has lost weight on review Has thick and outgrown toe nails Lives by himself  Wt Readings from Last 3 Encounters:  09/02/14 176 lb 12.8 oz (80.196 kg)  08/12/14 178 lb (80.74 kg)  05/20/14 189 lb (85.73 kg)    Review of Systems   Constitutional: Negative for fever, chills, malaise/fatigue and diaphoresis.   HENT: Negative for congestion, hearing loss and sore throat.    Eyes: Negative for blurred vision, double vision and discharge.   Respiratory: Negative for cough, sputum production, shortness of breath and wheezing.    Cardiovascular: Negative for chest pain, palpitations, orthopnea.  Gastrointestinal: Negative for heartburn, nausea, vomiting, abdominal pain   Genitourinary: Negative for dysuria, urgency, frequency and flank pain.   Musculoskeletal: Negative for back pain, falls   Skin: Negative for itching and rash.   Neurological: Negative for dizziness, tingling, focal weakness and headaches. Normal distal pulses, intact pinprick and vibration sensation Psychiatric/Behavioral: Negative for depression.The patient appears more forgetful  Past Medical History  Diagnosis Date  . Type II or unspecified type diabetes mellitus without mention of complication, uncontrolled   . DEMENTIA   . Neuromuscular disorder   . Hypertension   . Gout   . Arthritis   . Dementia 04/24/2012  . Unspecified hereditary and idiopathic peripheral neuropathy   . Anemia, unspecified   . Hypotension,  unspecified   . Acute kidney failure, unspecified   . Unspecified vitamin D deficiency   . Other and unspecified hyperlipidemia   . Hypopotassemia   . Disorder of bone and cartilage, unspecified   . Memory loss   . Hypotension, unspecified   . Acute kidney failure, unspecified   . Muscle weakness (generalized)   . Dizziness and giddiness   . Myalgia and myositis, unspecified   . Screening for ischemic heart disease   . Type II or unspecified type diabetes mellitus without mention of complication, uncontrolled   . Gout, unspecified   . Tobacco use disorder   . Memory loss   . Special screening for malignant neoplasm of prostate    Current Outpatient Prescriptions on File Prior to Visit  Medication Sig Dispense Refill  . hydrochlorothiazide (HYDRODIURIL) 25 MG tablet Take 1 tablet (25 mg total) by mouth daily.  30 tablet  3  . Memantine HCl ER (NAMENDA XR) 28 MG CP24 Take 28 mg by mouth daily.  30 capsule  3  . Multiple Vitamin (MULTIVITAMIN WITH MINERALS) TABS tablet Take 1 tablet by mouth daily.       No current facility-administered medications on file prior to visit.   Verified meds with pharmacy  Physical exam BP 128/78  Pulse 93  Temp(Src) 97.4 F (36.3 C) (Oral)  Resp 18  Ht 6\' 2"  (1.88 m)  Wt 176 lb 12.8 oz (80.196 kg)  BMI 22.69 kg/m2  SpO2 95%  General- elderly male in no acute distress Head- atraumatic, normocephalic Eyes- PERRLA, EOMI, no pallor, no icterus Neck- no lymphadenopathy, no thyromegaly,  no jugular vein distension Cardiovascular- normal s1,s2, no murmurs/ rubs/ gallops, intact distal pulses, trace edema in both feet Respiratory- bilateral clear to auscultation, no wheeze, no rhonchi, no crackles Abdomen- bowel sounds present, soft, non tender Musculoskeletal- able to move all 4 extremities, no spinal and paraspinal tenderness, steady gait, no use of assistive device, ingrown toe nail, hypertrophied toe nails Neurological- no focal deficit, normal  pinprick sensation, normal vibration, normal reflexes Psychiatry- alert and oriented to person, and place, normal mood and affect   Lab Results  Component Value Date   HGBA1C 6.4* 05/20/2014   Assessment/plan  1. Essential hypertension Continue hctz and check bmp - Basic Metabolic Panel  2. Type 2 diabetes mellitus with diabetic neuropathy Not on any med, check a1c and urine microabumin today. Continue gabapentin - Hemoglobin A1c - Microalbumin/Creatinine Ratio, Urine  3. Hypokalemia Not on any kcl supplement, check bmp  4. Arthritis Stable with prn tylenol  5. Dementia without behavioral disturbance Continue namenda xr, losing weight, encouraged protein supplements, no skin concern, daughter involved in care, decline anticipated, monitor clinically  6. Nail hypertrophy - Ambulatory referral to Podiatry

## 2014-09-03 LAB — BASIC METABOLIC PANEL
BUN/Creatinine Ratio: 14 (ref 10–22)
BUN: 14 mg/dL (ref 8–27)
CO2: 22 mmol/L (ref 18–29)
CREATININE: 0.99 mg/dL (ref 0.76–1.27)
Calcium: 9.9 mg/dL (ref 8.6–10.2)
Chloride: 102 mmol/L (ref 97–108)
GFR calc Af Amer: 88 mL/min/{1.73_m2} (ref >59)
GFR calc non Af Amer: 76 mL/min/{1.73_m2} (ref >59)
Glucose: 143 mg/dL — ABNORMAL HIGH (ref 65–99)
Potassium: 3.8 mmol/L (ref 3.5–5.2)
SODIUM: 144 mmol/L (ref 134–144)

## 2014-09-03 LAB — MICROALBUMIN / CREATININE URINE RATIO
Creatinine, Ur: 151.5 mg/dL (ref 22.0–328.0)
MICROALB/CREAT RATIO: 4.7 mg/g creat (ref 0.0–30.0)
MICROALBUM., U, RANDOM: 7.1 ug/mL (ref 0.0–17.0)

## 2014-09-03 LAB — HEMOGLOBIN A1C
Est. average glucose Bld gHb Est-mCnc: 154 mg/dL
Hgb A1c MFr Bld: 7 % — ABNORMAL HIGH (ref 4.8–5.6)

## 2014-09-04 ENCOUNTER — Telehealth: Payer: Self-pay | Admitting: *Deleted

## 2014-09-04 ENCOUNTER — Other Ambulatory Visit: Payer: Self-pay | Admitting: *Deleted

## 2014-09-04 MED ORDER — METFORMIN HCL 500 MG PO TABS: 500.0000 mg | ORAL_TABLET | Freq: Every day | ORAL | Status: DC

## 2014-09-04 NOTE — Telephone Encounter (Signed)
Spoke with son regarding patient medication changes and he stated that he would pick that up for his father.

## 2014-09-04 NOTE — Telephone Encounter (Signed)
Message copied by Lamont SnowballICE, Lenni Reckner L on Fri Sep 04, 2014  5:04 PM ------      Message from: Oneal GroutPANDEY, MAHIMA      Created: Fri Sep 04, 2014  4:06 PM       Lab work suggestive of diabetes. Will have him on metformin extended release 500 mg daily for now ------

## 2014-10-03 ENCOUNTER — Other Ambulatory Visit: Payer: Self-pay | Admitting: Internal Medicine

## 2014-10-26 ENCOUNTER — Other Ambulatory Visit: Payer: Self-pay | Admitting: Internal Medicine

## 2014-10-29 ENCOUNTER — Encounter: Payer: Self-pay | Admitting: Nurse Practitioner

## 2014-10-29 ENCOUNTER — Other Ambulatory Visit: Payer: Self-pay | Admitting: Internal Medicine

## 2014-10-29 ENCOUNTER — Ambulatory Visit (INDEPENDENT_AMBULATORY_CARE_PROVIDER_SITE_OTHER): Payer: Medicare Other | Admitting: Nurse Practitioner

## 2014-10-29 VITALS — BP 142/90 | HR 100 | Temp 97.3°F | Resp 12 | Ht 74.0 in | Wt 161.0 lb

## 2014-10-29 DIAGNOSIS — M170 Bilateral primary osteoarthritis of knee: Secondary | ICD-10-CM

## 2014-10-29 MED ORDER — TRAMADOL HCL 50 MG PO TABS: 50.0000 mg | ORAL_TABLET | Freq: Three times a day (TID) | ORAL | Status: DC | PRN

## 2014-10-29 NOTE — Patient Instructions (Signed)
To make appt with Dr Renato Gailseed for need injection  Will start ultram 50-100 mg every 8 hours as needed for pain

## 2014-10-29 NOTE — Progress Notes (Signed)
Patient ID: Gary Mcneil, male   DOB: 04/14/1941, 73 y.o.   MRN: [id number]    PCP: Oneal GroutPANDEY, MAHIMA, MD  Allergies  Allergen Reactions  . Penicillins Rash    Chief Complaint  Patient presents with  . Acute Visit    Stiffness in generalized areas, pain is constant and increased      HPI: Patient is a 73 y.o. male seen in the office today to evaluate pain in legs. Daughter here with patient and went out of town and when she returned he was complaining of pain. Reports increased pain in both legs. Joints stiff. He has not had a fall since last year. No injuries noted. Had been taking tylenol for the pain, does not help. Needs something else. Does not have any regular exercise. Sits and watches TV all day.  Has not good sleep, in pain.  Just started using cane 5- 6 months ago.  Did therapy "here and there" did not help. Does not wish to do this again.    Review of Systems:  Review of Systems  Constitutional: Negative for fever, diaphoresis, activity change, appetite change, fatigue and unexpected weight change.  Respiratory: Negative.   Cardiovascular: Negative.   Gastrointestinal: Negative.   Musculoskeletal: Positive for joint swelling (to left knee), arthralgias and gait problem (using cane).  Psychiatric/Behavioral: Positive for confusion.    Past Medical History  Diagnosis Date  . Type II or unspecified type diabetes mellitus without mention of complication, uncontrolled   . DEMENTIA   . Neuromuscular disorder   . Hypertension   . Gout   . Arthritis   . Dementia 04/24/2012  . Unspecified hereditary and idiopathic peripheral neuropathy   . Anemia, unspecified   . Hypotension, unspecified   . Acute kidney failure, unspecified   . Unspecified vitamin D deficiency   . Other and unspecified hyperlipidemia   . Hypopotassemia   . Disorder of bone and cartilage, unspecified   . Memory loss   . Hypotension, unspecified   . Acute kidney failure, unspecified   . Muscle  weakness (generalized)   . Dizziness and giddiness   . Myalgia and myositis, unspecified   . Screening for ischemic heart disease   . Type II or unspecified type diabetes mellitus without mention of complication, uncontrolled   . Gout, unspecified   . Tobacco use disorder   . Memory loss   . Special screening for malignant neoplasm of prostate    Past Surgical History  Procedure Laterality Date  . Inguinal hernia repair     Social History:   reports that he has never smoked. He has never used smokeless tobacco. He reports that he drinks alcohol. He reports that he does not use illicit drugs.  Family History  Problem Relation Age of Onset  . Diabetes Mother   . Hypertension Mother   . Diabetes Father   . Hypertension Father   . Kidney disease Father   . Diabetes Sister   . Kidney disease Brother     Medications: Patient's Medications  New Prescriptions   No medications on file  Previous Medications   GABAPENTIN (NEURONTIN) 300 MG CAPSULE    Take 1 capsule (300 mg total) by mouth 3 (three) times daily.   HYDROCHLOROTHIAZIDE (HYDRODIURIL) 25 MG TABLET    TAKE 1 TABLET BY MOUTH DAILY   MEMANTINE HCL ER (NAMENDA XR) 28 MG CP24    Take 28 mg by mouth daily.   METFORMIN (GLUCOPHAGE) 500 MG TABLET    TAKE  1 TABLET BY MOUTH DAILY   MULTIPLE VITAMIN (MULTIVITAMIN WITH MINERALS) TABS TABLET    Take 1 tablet by mouth daily.  Modified Medications   No medications on file  Discontinued Medications   No medications on file     Physical Exam:  Filed Vitals:   10/29/14 1530  BP: 142/90  Pulse: 100  Temp: 97.3 F (36.3 C)  TempSrc: Oral  Resp: 12  Height: 6\' 2"  (1.88 m)  Weight: 161 lb (73.029 kg)  SpO2: 96%    Physical Exam  Constitutional: No distress.  HENT:  Head: Normocephalic and atraumatic.  Cardiovascular: Normal rate, regular rhythm and normal heart sounds.   Pulmonary/Chest: Effort normal and breath sounds normal.  Abdominal: Soft. Bowel sounds are normal.    Musculoskeletal: He exhibits edema and tenderness.  Swelling noted to left knee and decreased ROM. Pain with movement to bilaterally knee joints   Neurological: He is alert.  Skin: Skin is warm and dry. He is not diaphoretic.  Psychiatric: He has a normal mood and affect.    Labs reviewed: Basic Metabolic Panel:  Recent Labs  16/08/9606/01/15 1032 08/12/14 1050 09/02/14 1605  NA 142 141 144  K 2.9* 2.6* 3.8  CL 101 94* 102  CO2 21 30* 22  GLUCOSE 129* 196* 143*  BUN 11 17 14   CREATININE 0.91 1.00 0.99  CALCIUM 9.2 9.6 9.9  TSH 2.650 2.230  --    Liver Function Tests:  Recent Labs  01/27/14 1100 01/28/14 0900 05/20/14 1032 08/12/14 1050  AST 200* 108* 33 44*  ALT 122* 100* 34 51*  ALKPHOS 92 126* 72 83  BILITOT 0.7 1.6* 0.6 0.4  PROT 7.0 7.6 6.8 7.0  ALBUMIN 4.0 4.4  --   --    No results for input(s): LIPASE, AMYLASE in the last 8760 hours. No results for input(s): AMMONIA in the last 8760 hours. CBC:  Recent Labs  12/18/13 1659  01/27/14 1100 01/28/14 0510 05/20/14 1032 08/12/14 1050  WBC 4.2  < > 7.1 5.5 5.1 5.6  NEUTROABS 2.4  --   --   --  2.9 3.4  HGB 12.7  --  14.1 14.1 14.2 15.1  HCT 35.5*  --  38.3* 39.1 39.1 41.8  MCV 91  --  87.8 89.5 86 86  PLT 150  --  154 125*  --   --   < > = values in this interval not displayed. Lipid Panel:  Recent Labs  05/20/14 1032  HDL 61  LDLCALC 151*  TRIG 197*  CHOLHDL 4.1   TSH:  Recent Labs  05/20/14 1032 08/12/14 1050  TSH 2.650 2.230   A1C: Lab Results  Component Value Date   HGBA1C 7.0* 09/02/2014     Assessment/Plan 1. Primary osteoarthritis of both knees -discussed with Dr Renato Gailseed, will have pt follow up with her next week when appt available for knee injections (and possible fluid removed)  -since tylenol not effective will start traMADol (ULTRAM) 50 MG tablet; Take 1-2 tablets (50-100 mg total) by mouth every 8 (eight) hours as needed. 50-100 mg by mouth every 8 hours as needed for pain   Dispense: 120 tablet; Refill: 0

## 2014-11-02 ENCOUNTER — Encounter: Payer: Self-pay | Admitting: Internal Medicine

## 2014-11-02 ENCOUNTER — Ambulatory Visit (INDEPENDENT_AMBULATORY_CARE_PROVIDER_SITE_OTHER): Payer: Medicare Other | Admitting: Internal Medicine

## 2014-11-02 VITALS — BP 142/88 | HR 107 | Temp 97.7°F | Resp 18 | Ht 74.0 in | Wt 161.0 lb

## 2014-11-02 DIAGNOSIS — M1712 Unilateral primary osteoarthritis, left knee: Secondary | ICD-10-CM

## 2014-11-02 MED ORDER — TRIAMCINOLONE ACETONIDE 40 MG/ML IJ SUSP
40.0000 mg | Freq: Once | INTRAMUSCULAR | Status: AC
  Administered 2014-11-02: 40 mg via INTRA_ARTICULAR

## 2014-11-02 NOTE — Addendum Note (Signed)
Addended by: Lamont SnowballICE, SHARON L on: 11/02/2014 02:32 PM   Modules accepted: Orders

## 2014-11-02 NOTE — Patient Instructions (Signed)
Knee Injection Joint injections are shots. Your caregiver will place a needle into your knee joint. The needle is used to put medicine into the joint. These shots can be used to help treat different painful knee conditions such as osteoarthritis, bursitis, local flare-ups of rheumatoid arthritis, and pseudogout. Anti-inflammatory medicines such as corticosteroids and anesthetics are the most common medicines used for joint and soft tissue injections.  PROCEDURE  The skin over the kneecap will be cleaned with an antiseptic solution.  Your caregiver will inject a small amount of a local anesthetic (a medicine like Novocaine) just under the skin in the area that was cleaned.  After the area becomes numb, a second injection is done. This second injection usually includes an anesthetic and an anti-inflammatory medicine called a steroid or cortisone. The needle is carefully placed in between the kneecap and the knee, and the medicine is injected into the joint space.  After the injection is done, the needle is removed. Your caregiver may place a bandage over the injection site. The whole procedure takes no more than a couple of minutes. BEFORE THE PROCEDURE  Wash all of the skin around the entire knee area. Try to remove any loose, scaling skin. There is no other specific preparation necessary unless advised otherwise by your caregiver. LET YOUR CAREGIVER KNOW ABOUT:   Allergies.  Medications taken including herbs, eye drops, over the counter medications, and creams.  Use of steroids (by mouth or creams).  Possible pregnancy, if applicable.  Previous problems with anesthetics or Novocaine.  History of blood clots (thrombophlebitis).  History of bleeding or blood problems.  Previous surgery.  Other health problems. RISKS AND COMPLICATIONS Side effects from cortisone shots are rare. They include:   Slight bruising of the skin.  Shrinkage of the normal fatty tissue under the skin where  the shot was given.  Increase in pain after the shot.  Infection.  Weakening of tendons or tendon rupture.  Allergic reaction to the medicine.  Diabetics may have a temporary increase in their blood sugar after a shot.  Cortisone can temporarily weaken the immune system. While receiving these shots, you should not get certain vaccines. Also, avoid contact with anyone who has chickenpox or measles. Especially if you have never had these diseases or have not been previously immunized. Your immune system may not be strong enough to fight off the infection while the cortisone is in your system. AFTER THE PROCEDURE   You can go home after the procedure.  You may need to put ice on the joint 15-20 minutes every 3 or 4 hours until the pain goes away.  You may need to put an elastic bandage on the joint. HOME CARE INSTRUCTIONS   Only take over-the-counter or prescription medicines for pain, discomfort, or fever as directed by your caregiver.  You should avoid stressing the joint. Unless advised otherwise, avoid activities that put a lot of pressure on a knee joint, such as:  Jogging.  Bicycling.  Recreational climbing.  Hiking.  Laying down and elevating the leg/knee above the level of your heart can help to minimize swelling. SEEK MEDICAL CARE IF:   You have repeated or worsening swelling.  There is drainage from the puncture area.  You develop red streaking that extends above or below the site where the needle was inserted. SEEK IMMEDIATE MEDICAL CARE IF:   You develop a fever.  You have pain that gets worse even though you are taking pain medicine.  The area is   red and warm, and you have trouble moving the joint. MAKE SURE YOU:   Understand these instructions.  Will watch your condition.  Will get help right away if you are not doing well or get worse. Document Released: 01/28/2007 Document Revised: 01/29/2012 Document Reviewed: 10/25/2007 [employer] Patient  Information 2015 [employer]. This information is not intended to replace advice given to you by your health care provider. Make sure you discuss any questions you have with your health care provider.  

## 2014-11-02 NOTE — Progress Notes (Signed)
Patient ID: Gary Mcneil, male   DOB: 08/28/1941, 73 y.o.   MRN: [id number]   Location:  [facility]  Code Status: DNR  Allergies  Allergen Reactions  . Penicillins Rash    Chief Complaint  Patient presents with  . Knee Pain    left , pain     HPI: Patient is a 73 y.o. black male patient of NP Eubanks seen in the office today for a steroid injection in his left knee.  He's been having uncontrolled pain in the knee, has to limp. Medications orally and topically have not relieved his pain.  He has had an effusion of the left knee that comes and goes.  Review of Systems:  Review of Systems  Constitutional: Negative for fever.  Musculoskeletal: Positive for joint pain.       Left with swelling and pain     Past Medical History  Diagnosis Date  . Type II or unspecified type diabetes mellitus without mention of complication, uncontrolled   . DEMENTIA   . Neuromuscular disorder   . Hypertension   . Gout   . Arthritis   . Dementia 04/24/2012  . Unspecified hereditary and idiopathic peripheral neuropathy   . Anemia, unspecified   . Hypotension, unspecified   . Acute kidney failure, unspecified   . Unspecified vitamin D deficiency   . Other and unspecified hyperlipidemia   . Hypopotassemia   . Disorder of bone and cartilage, unspecified   . Memory loss   . Hypotension, unspecified   . Acute kidney failure, unspecified   . Muscle weakness (generalized)   . Dizziness and giddiness   . Myalgia and myositis, unspecified   . Screening for ischemic heart disease   . Type II or unspecified type diabetes mellitus without mention of complication, uncontrolled   . Gout, unspecified   . Tobacco use disorder   . Memory loss   . Special screening for malignant neoplasm of prostate     Past Surgical History  Procedure Laterality Date  . Inguinal hernia repair      Social History:   reports that he has never smoked. He has never used  smokeless tobacco. He reports that he drinks alcohol. He reports that he does not use illicit drugs.  Family History  Problem Relation Age of Onset  . Diabetes Mother   . Hypertension Mother   . Diabetes Father   . Hypertension Father   . Kidney disease Father   . Diabetes Sister   . Kidney disease Brother     Medications: Patient's Medications  New Prescriptions   No medications on file  Previous Medications   GABAPENTIN (NEURONTIN) 300 MG CAPSULE    Take 1 capsule (300 mg total) by mouth 3 (three) times daily.   HYDROCHLOROTHIAZIDE (HYDRODIURIL) 25 MG TABLET    TAKE 1 TABLET BY MOUTH DAILY   MEMANTINE HCL ER (NAMENDA XR) 28 MG CP24    Take 28 mg by mouth daily.   METFORMIN (GLUCOPHAGE) 500 MG TABLET    TAKE 1 TABLET BY MOUTH DAILY   METFORMIN (GLUCOPHAGE) 500 MG TABLET    TAKE 1 TABLET BY MOUTH DAILY   MULTIPLE VITAMIN (MULTIVITAMIN WITH MINERALS) TABS TABLET    Take 1 tablet by mouth daily.   TRAMADOL (ULTRAM) 50 MG TABLET    Take 1-2 tablets (50-100 mg total) by mouth every 8 (eight) hours as needed. 50-100 mg by mouth every 8 hours as needed for pain  Modified Medications   No medications on file  Discontinued Medications   No medications on file     Physical Exam: Filed Vitals:   11/02/14 1141  BP: 142/88  Pulse: 107  Temp: 97.7 F (36.5 C)  TempSrc: Oral  Resp: 18  Height: 6\' 2"  (1.88 m)  Weight: 161 lb (73.029 kg)  SpO2: 98%  Physical Exam  Constitutional:  Tall thin black male, nad, poor hygiene  Genitourinary:  Has had urinary leakage  Musculoskeletal:  Left knee with mild effusion seen medially;  Tenderness worse laterally     Labs reviewed: Basic Metabolic Panel:  Recent Labs  16/08/9606/01/15 1032 08/12/14 1050 09/02/14 1605  NA 142 141 144  K 2.9* 2.6* 3.8  CL 101 94* 102  CO2 21 30* 22  GLUCOSE 129* 196* 143*  BUN 11 17 14   CREATININE 0.91 1.00 0.99  CALCIUM 9.2 9.6 9.9  TSH 2.650 2.230  --    Liver Function Tests:  Recent Labs   01/27/14 1100 01/28/14 0900 05/20/14 1032 08/12/14 1050  AST 200* 108* 33 44*  ALT 122* 100* 34 51*  ALKPHOS 92 126* 72 83  BILITOT 0.7 1.6* 0.6 0.4  PROT 7.0 7.6 6.8 7.0  ALBUMIN 4.0 4.4  --   --    No results for input(s): LIPASE, AMYLASE in the last 8760 hours. No results for input(s): AMMONIA in the last 8760 hours. CBC:  Recent Labs  12/18/13 1659  01/27/14 1100 01/28/14 0510 05/20/14 1032 08/12/14 1050  WBC 4.2  < > 7.1 5.5 5.1 5.6  NEUTROABS 2.4  --   --   --  2.9 3.4  HGB 12.7  --  14.1 14.1 14.2 15.1  HCT 35.5*  --  38.3* 39.1 39.1 41.8  MCV 91  --  87.8 89.5 86 86  PLT 150  --  154 125*  --   --   < > = values in this interval not displayed. Lipid Panel:  Recent Labs  05/20/14 1032  HDL 61  LDLCALC 151*  TRIG 197*  CHOLHDL 4.1   Lab Results  Component Value Date   HGBA1C 7.0* 09/02/2014   Assessment/Plan 1. Primary osteoarthritis of left knee -left knee injection of 1:1 lidocaine and kenalog performed into knee joint medially after cleansing the area with betadine swabs x 3, then spraying with bupivicaine spray;  bandaid applied -pt's gait was improved after the injection  Next appt:  6 wks with Shanda BumpsJessica  Kanijah Groseclose L. Inge Waldroup, D.O. Geriatrics [facility] [address] [place], [place] [address] Cell Phone (Mon-Fri 8am-5pm):  [phone] On Call:  [phone] & follow prompts after 5pm & weekends Office Phone:  [phone] Office Fax:  [phone]

## 2014-11-07 ENCOUNTER — Other Ambulatory Visit: Payer: Self-pay | Admitting: Internal Medicine

## 2014-11-21 ENCOUNTER — Other Ambulatory Visit: Payer: Self-pay | Admitting: Internal Medicine

## 2014-11-30 ENCOUNTER — Other Ambulatory Visit: Payer: Self-pay | Admitting: Nurse Practitioner

## 2014-11-30 ENCOUNTER — Other Ambulatory Visit: Payer: Self-pay | Admitting: Internal Medicine

## 2014-12-14 ENCOUNTER — Ambulatory Visit: Payer: Medicare Other | Admitting: Nurse Practitioner

## 2014-12-14 DIAGNOSIS — Z0289 Encounter for other administrative examinations: Secondary | ICD-10-CM

## 2015-01-04 ENCOUNTER — Other Ambulatory Visit: Payer: Self-pay | Admitting: Internal Medicine

## 2015-01-04 ENCOUNTER — Other Ambulatory Visit: Payer: Self-pay | Admitting: Nurse Practitioner

## 2015-01-04 NOTE — Telephone Encounter (Signed)
Left message on voicemail for patient to return call when available   

## 2015-01-05 NOTE — Telephone Encounter (Signed)
Spoke with patient's son, patient did not restart these requested medications. Patient is only taking what is on medication list

## 2015-01-05 NOTE — Telephone Encounter (Signed)
Per son, patient is only taking what is on medication list

## 2015-01-18 ENCOUNTER — Other Ambulatory Visit: Payer: Self-pay | Admitting: Nurse Practitioner

## 2015-02-15 ENCOUNTER — Encounter: Payer: Self-pay | Admitting: Nurse Practitioner

## 2015-02-15 ENCOUNTER — Ambulatory Visit (INDEPENDENT_AMBULATORY_CARE_PROVIDER_SITE_OTHER): Payer: Medicare Other | Admitting: Nurse Practitioner

## 2015-02-15 VITALS — BP 118/66 | HR 94 | Temp 97.8°F | Resp 16 | Ht 74.0 in | Wt 167.0 lb

## 2015-02-15 DIAGNOSIS — E1140 Type 2 diabetes mellitus with diabetic neuropathy, unspecified: Secondary | ICD-10-CM | POA: Diagnosis not present

## 2015-02-15 DIAGNOSIS — F0390 Unspecified dementia without behavioral disturbance: Secondary | ICD-10-CM | POA: Diagnosis not present

## 2015-02-15 DIAGNOSIS — E785 Hyperlipidemia, unspecified: Secondary | ICD-10-CM | POA: Diagnosis not present

## 2015-02-15 DIAGNOSIS — Z1211 Encounter for screening for malignant neoplasm of colon: Secondary | ICD-10-CM

## 2015-02-15 DIAGNOSIS — M1990 Unspecified osteoarthritis, unspecified site: Secondary | ICD-10-CM

## 2015-02-15 DIAGNOSIS — M109 Gout, unspecified: Secondary | ICD-10-CM | POA: Diagnosis not present

## 2015-02-15 DIAGNOSIS — I10 Essential (primary) hypertension: Secondary | ICD-10-CM

## 2015-02-15 MED ORDER — TRAMADOL HCL 50 MG PO TABS: 50.0000 mg | ORAL_TABLET | Freq: Three times a day (TID) | ORAL | Status: DC | PRN

## 2015-02-15 NOTE — Progress Notes (Signed)
Patient ID: Gary Mcneil, male   DOB: 06-Apr-1941, 74 y.o.   MRN: [id number]    PCP: Oneal Grout, MD  Allergies  Allergen Reactions  . Penicillins Rash    Chief Complaint  Patient presents with  . Medical Management of Chronic Issues    6 week follow-up, no recent labs       HPI: Patient is a 74 y.o. male seen in the office today for routine follow up.  Pt here with family friend (daughter of his deceased girlfriend), lives alone. OA all over and takes tramadol helps for pain.  Daughter and son help with medications so he is more compliant.  No gout fares Moving bowel and emptying bladder without complaints  Reports appetite is okay. Weight is up in the last 3 months.  Does not go to bed until late.  Mood has been stable.  Pt has no complaints other than arthritis  Does not recall colonoscopy-- will place referral  Review of Systems:  Review of Systems  Constitutional: Negative for fever, chills, activity change and appetite change.  Respiratory: Negative for cough.   Cardiovascular: Negative for chest pain.  Gastrointestinal: Negative for abdominal pain, diarrhea and constipation.  Genitourinary: Negative for dysuria, urgency and frequency.  Musculoskeletal: Positive for arthralgias. Negative for myalgias.  Skin: Negative for rash.  Neurological: Negative for dizziness, weakness and headaches.  Psychiatric/Behavioral: The patient is not nervous/anxious.        Dementia    Past Medical History  Diagnosis Date  . Type II or unspecified type diabetes mellitus without mention of complication, uncontrolled   . DEMENTIA   . Neuromuscular disorder   . Hypertension   . Gout   . Arthritis   . Dementia 04/24/2012  . Unspecified hereditary and idiopathic peripheral neuropathy   . Anemia, unspecified   . Hypotension, unspecified   . Acute kidney failure, unspecified   . Unspecified vitamin D deficiency   . Other and unspecified hyperlipidemia   . Hypopotassemia   .  Disorder of bone and cartilage, unspecified   . Memory loss   . Hypotension, unspecified   . Acute kidney failure, unspecified   . Muscle weakness (generalized)   . Dizziness and giddiness   . Myalgia and myositis, unspecified   . Screening for ischemic heart disease   . Type II or unspecified type diabetes mellitus without mention of complication, uncontrolled   . Gout, unspecified   . Tobacco use disorder   . Memory loss   . Special screening for malignant neoplasm of prostate    Past Surgical History  Procedure Laterality Date  . Inguinal hernia repair     Social History:   reports that he has never smoked. He has never used smokeless tobacco. He reports that he drinks alcohol. He reports that he does not use illicit drugs.  Family History  Problem Relation Age of Onset  . Diabetes Mother   . Hypertension Mother   . Diabetes Father   . Hypertension Father   . Kidney disease Father   . Diabetes Sister   . Kidney disease Brother     Medications: Patient's Medications  New Prescriptions   No medications on file  Previous Medications   ALLOPURINOL (ZYLOPRIM) 100 MG TABLET    TAKE 1 TABLET BY MOUTH EVERY DAY   GABAPENTIN (NEURONTIN) 300 MG CAPSULE    Take 1 capsule (300 mg total) by mouth 3 (three) times daily.   HYDROCHLOROTHIAZIDE (HYDRODIURIL) 25 MG TABLET  TAKE 1 TABLET BY MOUTH DAILY   METFORMIN (GLUCOPHAGE) 500 MG TABLET    TAKE 1 TABLET BY MOUTH DAILY   MULTIPLE VITAMIN (MULTIVITAMIN WITH MINERALS) TABS TABLET    Take 1 tablet by mouth daily.   NAMENDA XR 28 MG CP24    TAKE ONE CAPSULE BY MOUTH DAILY   POTASSIUM CHLORIDE SA (K-DUR,KLOR-CON) 20 MEQ TABLET    TAKE 2 TABLETS BY MOUTH TWICE DAILY  Modified Medications   Modified Medication Previous Medication   TRAMADOL (ULTRAM) 50 MG TABLET traMADol (ULTRAM) 50 MG tablet      Take 1-2 tablets (50-100 mg total) by mouth every 8 (eight) hours as needed.    TAKE 1 TO 2 TABLETS BY MOUTH EVERY 8 HOURS AS NEEDED    Discontinued Medications   METFORMIN (GLUCOPHAGE) 500 MG TABLET    TAKE 1 TABLET BY MOUTH DAILY     Physical Exam:  Filed Vitals:   02/15/15 1553  BP: 118/66  Pulse: 94  Temp: 97.8 F (36.6 C)  TempSrc: Oral  Resp: 16  Height:  (1.88 m)  Weight: 167 lb (75.751 kg)  SpO2: 96%    Physical Exam  Constitutional: No distress.  HENT:  Head: Normocephalic and atraumatic.  Mouth/Throat: Oropharynx is clear and moist. No oropharyngeal exudate.  Neck: Normal range of motion. Neck supple.  Cardiovascular: Normal rate, regular rhythm and normal heart sounds.   Pulmonary/Chest: Effort normal and breath sounds normal.  Abdominal: Soft. Bowel sounds are normal.  Musculoskeletal: He exhibits no edema or tenderness.  Neurological: He is alert.  confused  Skin: Skin is warm and dry. He is not diaphoretic.  Psychiatric: He has a normal mood and affect.    Labs reviewed: Basic Metabolic Panel:  Recent Labs  40/98/11 1032 08/12/14 1050 09/02/14 1605  NA 142 141 144  K 2.9* 2.6* 3.8  CL 101 94* 102  CO2 21 30* 22  GLUCOSE 129* 196* 143*  BUN CREATININE 0.91 1.00 0.99  CALCIUM 9.2 9.6 9.9  TSH 2.650 2.230  --    Liver Function Tests:  Recent Labs  05/20/14 1032 08/12/14 1050  AST 33 44*  ALT 34 51*  ALKPHOS 72 83  BILITOT 0.6 0.4  PROT 6.8 7.0   No results for input(s): LIPASE, AMYLASE in the last 8760 hours. No results for input(s): AMMONIA in the last 8760 hours. CBC:  Recent Labs  05/20/14 1032 08/12/14 1050  WBC 5.1 5.6  NEUTROABS 2.9 3.4  HGB 14.2 15.1  HCT 39.1 41.8  MCV 86 86   Lipid Panel:  Recent Labs  05/20/14 1032  CHOL 251*  HDL 61  LDLCALC 151*  TRIG 197*  CHOLHDL 4.1   TSH:  Recent Labs  05/20/14 1032 08/12/14 1050  TSH 2.650 2.230   A1C: Lab Results  Component Value Date   HGBA1C 7.0* 09/02/2014     Assessment/Plan 1. Essential hypertension -controlled on current regimen, will follow up blood  work  2. Dementia without behavioral disturbance -progressive, conts on namenda  After visit friend discusses that pt still drives, and she attempts to help with pt and advises him and son to not let him drive, pt drives anyway.  was found in [place] wet and soiled by a Personnel officer. Discuss pt is not to drive and consequence that can occur if he does. She does not feel like he is well taken care of and plan on calling department of social services.  3. Type 2 diabetes mellitus with diabetic neuropathy -conts on metformin, does not take blood sugars, no diet modifications - CBC with Differential/Platelet - Hemoglobin A1c  4. Arthritis Major complaints, reports pain is tolerated with tramadol -refill provided  5. Gout of multiple sites, unspecified cause, unspecified chronicity -no recent flares, conts allopurinol  - Uric acid  6. Hyperlipidemia -not on any medications, not fasting today, will follow up labs before next visit -does not follow heart healthy diet - Comprehensive metabolic panel - Lipid panel; Future  7. Special screening for malignant neoplasms, colon - Ambulatory referral to Gastroenterology  Follow up in 3 months, sooner if needed

## 2015-02-15 NOTE — Patient Instructions (Signed)
Will get blood work today Need lipid panel so we will get this prior to next visit Follow up in 3 months with fasting blood work prior to visit

## 2015-02-16 LAB — COMPREHENSIVE METABOLIC PANEL
A/G RATIO: 2.2 (ref 1.1–2.5)
ALBUMIN: 4.7 g/dL (ref 3.5–4.8)
ALK PHOS: 65 IU/L (ref 39–117)
ALT: 25 IU/L (ref 0–44)
AST: 25 IU/L (ref 0–40)
BUN / CREAT RATIO: 14 (ref 10–22)
BUN: 13 mg/dL (ref 8–27)
Bilirubin Total: 0.5 mg/dL (ref 0.0–1.2)
CALCIUM: 10.1 mg/dL (ref 8.6–10.2)
CHLORIDE: 100 mmol/L (ref 97–108)
CO2: 26 mmol/L (ref 18–29)
CREATININE: 0.91 mg/dL (ref 0.76–1.27)
GFR calc Af Amer: 98 mL/min/{1.73_m2} (ref >59)
GFR, EST NON AFRICAN AMERICAN: 84 mL/min/{1.73_m2} (ref >59)
Globulin, Total: 2.1 g/dL (ref 1.5–4.5)
Glucose: 117 mg/dL — ABNORMAL HIGH (ref 65–99)
POTASSIUM: 3.6 mmol/L (ref 3.5–5.2)
Sodium: 143 mmol/L (ref 134–144)
Total Protein: 6.8 g/dL (ref 6.0–8.5)

## 2015-02-16 LAB — CBC WITH DIFFERENTIAL/PLATELET
BASOS ABS: 0 10*3/uL (ref 0.0–0.2)
BASOS: 1 %
EOS: 5 %
Eosinophils Absolute: 0.3 10*3/uL (ref 0.0–0.4)
HCT: 43 % (ref 37.5–51.0)
Hemoglobin: 14.8 g/dL (ref 12.6–17.7)
IMMATURE GRANS (ABS): 0 10*3/uL (ref 0.0–0.1)
Immature Granulocytes: 0 %
Lymphocytes Absolute: 1.7 10*3/uL (ref 0.7–3.1)
Lymphs: 25 %
MCH: 29.6 pg (ref 26.6–33.0)
MCHC: 34.4 g/dL (ref 31.5–35.7)
MCV: 86 fL (ref 79–97)
MONOS ABS: 0.5 10*3/uL (ref 0.1–0.9)
Monocytes: 7 %
NEUTROS ABS: 4.3 10*3/uL (ref 1.4–7.0)
NEUTROS PCT: 62 %
PLATELETS: 180 10*3/uL (ref 150–379)
RBC: 5 x10E6/uL (ref 4.14–5.80)
RDW: 16.3 % — ABNORMAL HIGH (ref 12.3–15.4)
WBC: 6.8 10*3/uL (ref 3.4–10.8)

## 2015-02-16 LAB — HEMOGLOBIN A1C
ESTIMATED AVERAGE GLUCOSE: 128 mg/dL
Hgb A1c MFr Bld: 6.1 % — ABNORMAL HIGH (ref 4.8–5.6)

## 2015-02-16 LAB — URIC ACID: URIC ACID: 9.3 mg/dL — AB (ref 3.7–8.6)

## 2015-02-22 ENCOUNTER — Telehealth: Payer: Self-pay | Admitting: Nurse Practitioner

## 2015-02-22 NOTE — Telephone Encounter (Signed)
MR.Georgiades's son [place] off a [place] form to be filled out by [employer] Eubanks. The form was placed in the rx tray in medical records.

## 2015-02-22 NOTE — Telephone Encounter (Signed)
Son also needs a letter stating that patient cannot handle his own affairs. Son left a note with the [place] paperwork. Added to folder. Filled out what I could of the VA Forms and given to Shanda BumpsJessica to review and sign.

## 2015-02-23 NOTE — Telephone Encounter (Signed)
Gary Mcneil took paperwork with her to fill out. Stated she will bring back on Thursday.

## 2015-02-24 NOTE — Telephone Encounter (Signed)
Per Jessica---Patient needs an appointment to fill out the forms.  LMOM to return call to schedule appointment.

## 2015-02-25 ENCOUNTER — Encounter: Payer: Self-pay | Admitting: Nurse Practitioner

## 2015-02-25 ENCOUNTER — Ambulatory Visit (INDEPENDENT_AMBULATORY_CARE_PROVIDER_SITE_OTHER): Payer: Medicare Other | Admitting: Nurse Practitioner

## 2015-02-25 VITALS — BP 124/72 | HR 78 | Temp 97.9°F | Ht 72.0 in | Wt 168.0 lb

## 2015-02-25 DIAGNOSIS — F0390 Unspecified dementia without behavioral disturbance: Secondary | ICD-10-CM

## 2015-02-25 DIAGNOSIS — M109 Gout, unspecified: Secondary | ICD-10-CM | POA: Diagnosis not present

## 2015-02-25 MED ORDER — DONEPEZIL HCL 10 MG PO TABS: ORAL_TABLET | ORAL | Status: DC

## 2015-02-25 MED ORDER — ALLOPURINOL 100 MG PO TABS: 200.0000 mg | ORAL_TABLET | Freq: Every day | ORAL | Status: DC

## 2015-02-25 NOTE — Progress Notes (Signed)
Passed clock drawing 

## 2015-02-25 NOTE — Telephone Encounter (Signed)
Patient had appointment today with Shanda BumpsJessica.

## 2015-02-25 NOTE — Patient Instructions (Signed)
Gary Mcneil has advancing memory loss and should NOT drive.   To start Aricept 5 mg daily at bedtime for 1 month, if tolerating without side effect increase to 10 mg daily at bedtime for MEMORY LOSS  To increase Gabapentin to 200 mg daily (2 of the 100 mg tablets)

## 2015-02-25 NOTE — Progress Notes (Signed)
Patient ID: Gary Mcneil, male   DOB: 02/09/1941, 74 y.o.   MRN: [id number]    PCP: Gary SellerEUBANKS, Jenalee Trevizo K, NP  Allergies  Allergen Reactions  . Penicillins Rash    Chief Complaint  Patient presents with  . Form Completion    Complete VA forms, MMSE     HPI: Patient is a 74 y.o. male seen in the office today to [place] the [place] for more assistance and needs a letter so the son can take over his finances.   Pt with friend again today. Pt still conts to drive.  Pt without complaints today.  Review of Systems:  Review of Systems  Constitutional: Negative for fever, chills, activity change and appetite change.  Respiratory: Negative for cough.   Cardiovascular: Negative for chest pain.  Gastrointestinal: Negative for abdominal pain, diarrhea and constipation.  Genitourinary: Negative for dysuria, urgency and frequency.  Musculoskeletal: Positive for arthralgias. Negative for myalgias.       Walks without assistive devices  Skin: Negative for rash.  Neurological: Negative for dizziness, weakness and headaches.  Psychiatric/Behavioral: The patient is not nervous/anxious.        Dementia    Past Medical History  Diagnosis Date  . Type II or unspecified type diabetes mellitus without mention of complication, uncontrolled   . DEMENTIA   . Neuromuscular disorder   . Hypertension   . Gout   . Arthritis   . Dementia 04/24/2012  . Unspecified hereditary and idiopathic peripheral neuropathy   . Anemia, unspecified   . Hypotension, unspecified   . Acute kidney failure, unspecified   . Unspecified vitamin D deficiency   . Other and unspecified hyperlipidemia   . Hypopotassemia   . Disorder of bone and cartilage, unspecified   . Memory loss   . Hypotension, unspecified   . Acute kidney failure, unspecified   . Muscle weakness (generalized)   . Dizziness and giddiness   . Myalgia and myositis, unspecified   . Screening for ischemic heart disease   . Type II or  unspecified type diabetes mellitus without mention of complication, uncontrolled   . Gout, unspecified   . Tobacco use disorder   . Memory loss   . Special screening for malignant neoplasm of prostate    Past Surgical History  Procedure Laterality Date  . Inguinal hernia repair     Social History:   reports that he has never smoked. He has never used smokeless tobacco. He reports that he drinks alcohol. He reports that he does not use illicit drugs.  Family History  Problem Relation Age of Onset  . Diabetes Mother   . Hypertension Mother   . Diabetes Father   . Hypertension Father   . Kidney disease Father   . Diabetes Sister   . Kidney disease Brother     Medications: Patient's Medications  New Prescriptions   No medications on file  Previous Medications   ALLOPURINOL (ZYLOPRIM) 100 MG TABLET    TAKE 1 TABLET BY MOUTH EVERY DAY   GABAPENTIN (NEURONTIN) 300 MG CAPSULE    Take 1 capsule (300 mg total) by mouth 3 (three) times daily.   HYDROCHLOROTHIAZIDE (HYDRODIURIL) 25 MG TABLET    TAKE 1 TABLET BY MOUTH DAILY   METFORMIN (GLUCOPHAGE) 500 MG TABLET    TAKE 1 TABLET BY MOUTH DAILY   MULTIPLE VITAMIN (MULTIVITAMIN WITH MINERALS) TABS TABLET    Take 1 tablet by mouth daily.   NAMENDA XR 28 MG CP24  TAKE ONE CAPSULE BY MOUTH DAILY   POTASSIUM CHLORIDE SA (Mcneil-DUR,KLOR-CON) 20 MEQ TABLET    TAKE 2 TABLETS BY MOUTH TWICE DAILY   TRAMADOL (ULTRAM) 50 MG TABLET    Take 1-2 tablets (50-100 mg total) by mouth every 8 (eight) hours as needed.  Modified Medications   No medications on file  Discontinued Medications   No medications on file     Physical Exam:  Filed Vitals:   02/25/15 1411  BP: 124/72  Pulse: 78  Temp: 97.9 F (36.6 C)  TempSrc: Oral  Height: 6' (1.829 m)  Weight: 168 lb (76.204 kg)  SpO2: 94%    Physical Exam  Constitutional: No distress.  HENT:  Head: Normocephalic and atraumatic.  Nose: Nose normal.  Mouth/Throat: Oropharynx is clear and moist.  No oropharyngeal exudate.  Neck: Normal range of motion. Neck supple.  Cardiovascular: Normal rate, regular rhythm and normal heart sounds.   Pulmonary/Chest: Effort normal and breath sounds normal.  Abdominal: Soft. Bowel sounds are normal.  Musculoskeletal: Normal range of motion. He exhibits no edema or tenderness.  Neurological: He is alert. He displays normal reflexes. No cranial nerve deficit.  confused  Skin: Skin is warm and dry. He is not diaphoretic.  Psychiatric: He has a normal mood and affect.    Labs reviewed: Basic Metabolic Panel:  Recent Labs  16/10/96 1032 08/12/14 1050 09/02/14 1605 02/15/15 1628  NA 142 141 144 143  Mcneil 2.9* 2.6* 3.8 3.6  CL 101 94* 102 100  CO2 21 30* 22 26  GLUCOSE 129* 196* 143* 117*  BUN CREATININE 0.91 1.00 0.99 0.91  CALCIUM 9.2 9.6 9.9 10.1  TSH 2.650 2.230  --   --    Liver Function Tests:  Recent Labs  05/20/14 1032 08/12/14 1050 02/15/15 1628  AST 33 44* 25  ALT 34 51* 25  ALKPHOS 72 83 65  BILITOT 0.6 0.4 0.5  PROT 6.8 7.0 6.8   No results for input(s): LIPASE, AMYLASE in the last 8760 hours. No results for input(s): AMMONIA in the last 8760 hours. CBC:  Recent Labs  05/20/14 1032 08/12/14 1050 02/15/15 1628  WBC 5.1 5.6 6.8  NEUTROABS 2.9 3.4 4.3  HGB 14.2 15.1 14.8  HCT 39.1 41.8 43.0  MCV 86 86 86  PLT  --   --  180   Lipid Panel:  Recent Labs  05/20/14 1032  CHOL 251*  HDL 61  LDLCALC 151*  TRIG 197*  CHOLHDL 4.1   TSH:  Recent Labs  05/20/14 1032 08/12/14 1050  TSH 2.650 2.230   A1C: Lab Results  Component Value Date   HGBA1C 6.1* 02/15/2015     Assessment/Plan  1. Dementia, without behavioral disturbance -MMSE of 20/30 indication moderate memory loss. -pt is also visual impaired, does not have his glasses today but also reports he does not wear them consistency. -again reinforced that the patient should not be driving.  Donepezil (ARICEPT) 10 MG tablet; Start 5  mg (1/2 tablet) daily at bedtime for 1 month then increase to 10 mg (1 tablet) daily at bedtime for memory  Dispense: 30 tablet; Refill: 4 -forms filled out for assistance program and note written for son   2. Gout, unspecified cause, unspecified chronicity, unspecified site -labs reviewed, uric acid elevated -will increase allopurinol to 200 mg daily to help reduce uric acid level - allopurinol (ZYLOPRIM) 100 MG tablet; Take 2 tablets (200 mg total) by mouth daily.  Dispense:  60 tablet; Refill: 3 - Uric acid; Future   30 mins Time TOTAL:  time greater than 50% of total time spent doing coordination of care regarding memory loss and plan of care with pt and friend and form completion.

## 2015-03-07 ENCOUNTER — Other Ambulatory Visit: Payer: Self-pay | Admitting: Nurse Practitioner

## 2015-03-15 ENCOUNTER — Other Ambulatory Visit: Payer: Self-pay | Admitting: Internal Medicine

## 2015-03-28 ENCOUNTER — Other Ambulatory Visit: Payer: Self-pay | Admitting: Nurse Practitioner

## 2015-04-14 ENCOUNTER — Ambulatory Visit: Payer: Medicare Other | Admitting: Internal Medicine

## 2015-04-20 ENCOUNTER — Encounter (HOSPITAL_COMMUNITY): Payer: Self-pay | Admitting: Emergency Medicine

## 2015-04-20 ENCOUNTER — Inpatient Hospital Stay (HOSPITAL_COMMUNITY): Payer: Medicare Other

## 2015-04-20 ENCOUNTER — Emergency Department (HOSPITAL_COMMUNITY): Payer: Medicare Other

## 2015-04-20 ENCOUNTER — Inpatient Hospital Stay (HOSPITAL_COMMUNITY)
Admission: EM | Admit: 2015-04-20 | Discharge: 2015-04-21 | DRG: 204 | Disposition: A | Discharge status: Home / Self Care | Payer: Medicare Other | Attending: Internal Medicine | Admitting: Internal Medicine

## 2015-04-20 DIAGNOSIS — J9601 Acute respiratory failure with hypoxia: Secondary | ICD-10-CM | POA: Diagnosis present

## 2015-04-20 DIAGNOSIS — E860 Dehydration: Secondary | ICD-10-CM | POA: Diagnosis present

## 2015-04-20 DIAGNOSIS — J189 Pneumonia, unspecified organism: Secondary | ICD-10-CM

## 2015-04-20 DIAGNOSIS — R918 Other nonspecific abnormal finding of lung field: Secondary | ICD-10-CM | POA: Diagnosis not present

## 2015-04-20 DIAGNOSIS — E876 Hypokalemia: Secondary | ICD-10-CM | POA: Diagnosis present

## 2015-04-20 DIAGNOSIS — R072 Precordial pain: Secondary | ICD-10-CM | POA: Diagnosis not present

## 2015-04-20 DIAGNOSIS — R740 Nonspecific elevation of levels of transaminase and lactic acid dehydrogenase [LDH]: Secondary | ICD-10-CM

## 2015-04-20 DIAGNOSIS — F0390 Unspecified dementia without behavioral disturbance: Secondary | ICD-10-CM | POA: Diagnosis present

## 2015-04-20 DIAGNOSIS — M1990 Unspecified osteoarthritis, unspecified site: Secondary | ICD-10-CM | POA: Diagnosis present

## 2015-04-20 DIAGNOSIS — E785 Hyperlipidemia, unspecified: Secondary | ICD-10-CM | POA: Diagnosis present

## 2015-04-20 DIAGNOSIS — R0781 Pleurodynia: Secondary | ICD-10-CM | POA: Diagnosis present

## 2015-04-20 DIAGNOSIS — I10 Essential (primary) hypertension: Secondary | ICD-10-CM | POA: Diagnosis present

## 2015-04-20 DIAGNOSIS — R079 Chest pain, unspecified: Secondary | ICD-10-CM | POA: Diagnosis not present

## 2015-04-20 DIAGNOSIS — E119 Type 2 diabetes mellitus without complications: Secondary | ICD-10-CM | POA: Diagnosis present

## 2015-04-20 DIAGNOSIS — Z87891 Personal history of nicotine dependence: Secondary | ICD-10-CM | POA: Diagnosis not present

## 2015-04-20 DIAGNOSIS — J90 Pleural effusion, not elsewhere classified: Secondary | ICD-10-CM | POA: Diagnosis present

## 2015-04-20 DIAGNOSIS — R7401 Elevation of levels of liver transaminase levels: Secondary | ICD-10-CM | POA: Diagnosis present

## 2015-04-20 DIAGNOSIS — Z6822 Body mass index (BMI) 22.0-22.9, adult: Secondary | ICD-10-CM

## 2015-04-20 DIAGNOSIS — M109 Gout, unspecified: Secondary | ICD-10-CM | POA: Diagnosis present

## 2015-04-20 DIAGNOSIS — E43 Unspecified severe protein-calorie malnutrition: Secondary | ICD-10-CM | POA: Diagnosis present

## 2015-04-20 DIAGNOSIS — K8020 Calculus of gallbladder without cholecystitis without obstruction: Secondary | ICD-10-CM | POA: Diagnosis present

## 2015-04-20 DIAGNOSIS — R7989 Other specified abnormal findings of blood chemistry: Secondary | ICD-10-CM | POA: Diagnosis present

## 2015-04-20 HISTORY — DX: Gastro-esophageal reflux disease without esophagitis: K21.9

## 2015-04-20 LAB — BASIC METABOLIC PANEL
Anion gap: 16 — ABNORMAL HIGH (ref 5–15)
BUN: 11 mg/dL (ref 6–20)
CO2: 22 mmol/L (ref 22–32)
Calcium: 9.3 mg/dL (ref 8.9–10.3)
Chloride: 97 mmol/L — ABNORMAL LOW (ref 101–111)
Creatinine, Ser: 0.94 mg/dL (ref 0.61–1.24)
GFR calc Af Amer: >60 mL/min (ref >60)
GFR calc non Af Amer: >60 mL/min (ref >60)
GLUCOSE: 114 mg/dL — AB (ref 65–99)
POTASSIUM: 3.4 mmol/L — AB (ref 3.5–5.1)
SODIUM: 135 mmol/L (ref 135–145)

## 2015-04-20 LAB — CBC
HEMATOCRIT: 40.3 % (ref 39.0–52.0)
Hemoglobin: 14.7 g/dL (ref 13.0–17.0)
MCH: 30.1 pg (ref 26.0–34.0)
MCHC: 36.5 g/dL — ABNORMAL HIGH (ref 30.0–36.0)
MCV: 82.6 fL (ref 78.0–100.0)
Platelets: 184 10*3/uL (ref 150–400)
RBC: 4.88 MIL/uL (ref 4.22–5.81)
RDW: 14.3 % (ref 11.5–15.5)
WBC: 12.8 10*3/uL — ABNORMAL HIGH (ref 4.0–10.5)

## 2015-04-20 LAB — HEPATIC FUNCTION PANEL
ALT: 81 U/L — ABNORMAL HIGH (ref 17–63)
AST: 67 U/L — AB (ref 15–41)
Albumin: 3.4 g/dL — ABNORMAL LOW (ref 3.5–5.0)
Alkaline Phosphatase: 110 U/L (ref 38–126)
BILIRUBIN DIRECT: 0.7 mg/dL — AB (ref 0.1–0.5)
Indirect Bilirubin: 1.3 mg/dL — ABNORMAL HIGH (ref 0.3–0.9)
TOTAL PROTEIN: 7.3 g/dL (ref 6.5–8.1)
Total Bilirubin: 2 mg/dL — ABNORMAL HIGH (ref 0.3–1.2)

## 2015-04-20 LAB — I-STAT TROPONIN, ED: TROPONIN I, POC: 0 ng/mL (ref 0.00–0.08)

## 2015-04-20 LAB — GLUCOSE, CAPILLARY
GLUCOSE-CAPILLARY: 120 mg/dL — AB (ref 65–99)
GLUCOSE-CAPILLARY: 103 mg/dL — AB (ref 65–99)
Glucose-Capillary: 91 mg/dL (ref 65–99)

## 2015-04-20 LAB — BRAIN NATRIURETIC PEPTIDE: B NATRIURETIC PEPTIDE 5: 66.8 pg/mL (ref 0.0–100.0)

## 2015-04-20 LAB — D-DIMER, QUANTITATIVE: D-Dimer, Quant: 3.98 ug/mL-FEU — ABNORMAL HIGH (ref 0.00–0.48)

## 2015-04-20 LAB — I-STAT CG4 LACTIC ACID, ED: LACTIC ACID, VENOUS: 1.51 mmol/L (ref 0.5–2.0)

## 2015-04-20 LAB — D-DIMER, QUANTITATIVE (NOT AT ARMC): D DIMER QUANT: 2.99 ug{FEU}/mL — AB (ref 0.00–0.48)

## 2015-04-20 MED ORDER — SODIUM CHLORIDE 0.9 % IJ SOLN: 3.0000 mL | Freq: Two times a day (BID) | INTRAMUSCULAR | Status: DC

## 2015-04-20 MED ORDER — GABAPENTIN 300 MG PO CAPS
300.0000 mg | ORAL_CAPSULE | Freq: Three times a day (TID) | ORAL | Status: DC
  Administered 2015-04-20 – 2015-04-21 (×4): 300 mg via ORAL
  Filled 2015-04-20 (×6): qty 1

## 2015-04-20 MED ORDER — FENTANYL CITRATE (PF) 100 MCG/2ML IJ SOLN
50.0000 ug | Freq: Once | INTRAMUSCULAR | Status: AC
  Administered 2015-04-20: 50 ug via INTRAVENOUS
  Filled 2015-04-20: qty 2

## 2015-04-20 MED ORDER — ONDANSETRON HCL 4 MG/2ML IJ SOLN
4.0000 mg | Freq: Four times a day (QID) | INTRAMUSCULAR | Status: DC | PRN
Start: 2015-04-20 — End: 2015-04-21

## 2015-04-20 MED ORDER — LACTATED RINGERS IV BOLUS (SEPSIS)
1000.0000 mL | Freq: Once | INTRAVENOUS | Status: AC
  Administered 2015-04-20: 1000 mL via INTRAVENOUS

## 2015-04-20 MED ORDER — INSULIN ASPART 100 UNIT/ML ~~LOC~~ SOLN: 0.0000 [IU] | Freq: Every day | SUBCUTANEOUS | Status: DC

## 2015-04-20 MED ORDER — DEXTROSE 5 % IV SOLN: 500.0000 mg | Freq: Once | INTRAVENOUS | Status: DC

## 2015-04-20 MED ORDER — SODIUM CHLORIDE 0.9 % IV BOLUS (SEPSIS)
1000.0000 mL | Freq: Once | INTRAVENOUS | Status: AC
  Administered 2015-04-20: 1000 mL via INTRAVENOUS

## 2015-04-20 MED ORDER — ENOXAPARIN SODIUM 40 MG/0.4ML ~~LOC~~ SOLN
40.0000 mg | SUBCUTANEOUS | Status: DC
  Filled 2015-04-20: qty 0.4

## 2015-04-20 MED ORDER — CEFTRIAXONE SODIUM 1 G IJ SOLR
1.0000 g | INTRAMUSCULAR | Status: DC
  Filled 2015-04-20: qty 10

## 2015-04-20 MED ORDER — ASPIRIN 81 MG PO CHEW: 324.0000 mg | CHEWABLE_TABLET | Freq: Once | ORAL | Status: DC

## 2015-04-20 MED ORDER — DOCUSATE SODIUM 100 MG PO CAPS
100.0000 mg | ORAL_CAPSULE | Freq: Two times a day (BID) | ORAL | Status: DC
  Administered 2015-04-20 (×2): 100 mg via ORAL
  Filled 2015-04-20 (×4): qty 1

## 2015-04-20 MED ORDER — INSULIN ASPART 100 UNIT/ML ~~LOC~~ SOLN: 0.0000 [IU] | Freq: Three times a day (TID) | SUBCUTANEOUS | Status: DC

## 2015-04-20 MED ORDER — ALUM & MAG HYDROXIDE-SIMETH 200-200-20 MG/5ML PO SUSP: 30.0000 mL | Freq: Four times a day (QID) | ORAL | Status: DC | PRN

## 2015-04-20 MED ORDER — DONEPEZIL HCL 5 MG PO TABS
5.0000 mg | ORAL_TABLET | Freq: Every day | ORAL | Status: DC
  Administered 2015-04-20: 5 mg via ORAL
  Filled 2015-04-20 (×2): qty 1

## 2015-04-20 MED ORDER — KETOROLAC TROMETHAMINE 30 MG/ML IJ SOLN
30.0000 mg | Freq: Once | INTRAMUSCULAR | Status: AC
  Administered 2015-04-20: 30 mg via INTRAVENOUS
  Filled 2015-04-20: qty 1

## 2015-04-20 MED ORDER — SODIUM CHLORIDE 0.9 % IV SOLN
INTRAVENOUS | Status: DC
  Administered 2015-04-20 – 2015-04-21 (×2): via INTRAVENOUS

## 2015-04-20 MED ORDER — MEMANTINE HCL 10 MG PO TABS: 10.0000 mg | ORAL_TABLET | Freq: Two times a day (BID) | ORAL | Status: DC

## 2015-04-20 MED ORDER — DEXTROSE 5 % IV SOLN
1.0000 g | Freq: Once | INTRAVENOUS | Status: AC
  Administered 2015-04-20: 1 g via INTRAVENOUS
  Filled 2015-04-20: qty 10

## 2015-04-20 MED ORDER — TRAMADOL HCL 50 MG PO TABS
50.0000 mg | ORAL_TABLET | Freq: Four times a day (QID) | ORAL | Status: DC | PRN
  Administered 2015-04-20: 50 mg via ORAL
  Filled 2015-04-20: qty 1

## 2015-04-20 MED ORDER — OXYCODONE HCL 5 MG PO TABS
5.0000 mg | ORAL_TABLET | ORAL | Status: DC | PRN
  Administered 2015-04-20: 5 mg via ORAL
  Filled 2015-04-20: qty 1

## 2015-04-20 MED ORDER — MORPHINE SULFATE 2 MG/ML IJ SOLN
1.0000 mg | INTRAMUSCULAR | Status: DC | PRN
  Administered 2015-04-20: 2 mg via INTRAVENOUS
  Filled 2015-04-20: qty 1

## 2015-04-20 MED ORDER — IOHEXOL 350 MG/ML SOLN
80.0000 mL | Freq: Once | INTRAVENOUS | Status: AC | PRN
Start: 2015-04-20 — End: 2015-04-20
  Administered 2015-04-20: 80 mL via INTRAVENOUS

## 2015-04-20 MED ORDER — ALLOPURINOL 100 MG PO TABS
200.0000 mg | ORAL_TABLET | Freq: Every day | ORAL | Status: DC
  Administered 2015-04-20 – 2015-04-21 (×2): 200 mg via ORAL
  Filled 2015-04-20 (×2): qty 2

## 2015-04-20 MED ORDER — AZITHROMYCIN 500 MG IV SOLR
500.0000 mg | INTRAVENOUS | Status: DC
  Filled 2015-04-20: qty 500

## 2015-04-20 MED ORDER — DEXTROSE 5 % IV SOLN
500.0000 mg | Freq: Once | INTRAVENOUS | Status: AC
  Administered 2015-04-20: 500 mg via INTRAVENOUS
  Filled 2015-04-20: qty 500

## 2015-04-20 MED ORDER — MEMANTINE HCL ER 28 MG PO CP24
28.0000 mg | ORAL_CAPSULE | Freq: Every day | ORAL | Status: DC
  Administered 2015-04-20 – 2015-04-21 (×2): 28 mg via ORAL
  Filled 2015-04-20 (×2): qty 1

## 2015-04-20 MED ORDER — ONDANSETRON HCL 4 MG PO TABS: 4.0000 mg | ORAL_TABLET | Freq: Four times a day (QID) | ORAL | Status: DC | PRN

## 2015-04-20 MED ORDER — DEXTROSE 5 % IV SOLN: 1.0000 g | Freq: Once | INTRAVENOUS | Status: DC

## 2015-04-20 MED ORDER — ADULT MULTIVITAMIN W/MINERALS CH
1.0000 | ORAL_TABLET | Freq: Every day | ORAL | Status: DC
  Administered 2015-04-21: 1 via ORAL
  Filled 2015-04-20: qty 1

## 2015-04-20 MED ORDER — THIAMINE HCL 100 MG/ML IJ SOLN
Freq: Once | INTRAVENOUS | Status: AC
  Administered 2015-04-20: 14:00:00 via INTRAVENOUS
  Filled 2015-04-20: qty 1000

## 2015-04-20 NOTE — ED Notes (Signed)
Hospitalitis at bedside

## 2015-04-20 NOTE — ED Notes (Signed)
Patient transported to CT 

## 2015-04-20 NOTE — ED Notes (Signed)
Pt wallet, keys, phone clothes put in pt belonging bag and placed on stretcher with patient to go upstairs.

## 2015-04-20 NOTE — ED Notes (Signed)
Per EMS, the patient had chest pain around 3am while laying in bed, right sided and stabbing. Pain in chest increases with respirations. 12 lead showed sinus tach, rate of 112. On 4L nasal cannula, 97%. o2 on room air read 88-90% initially with EMS. 324mg  of aspirin and 2 nitro tabs given prior to arrival. 18g in left forearm. cbg 118, bp 117/73, p 123. Patient is from home.

## 2015-04-20 NOTE — ED Provider Notes (Signed)
CSN: [id number]     Arrival date & time 04/20/15  [id number] History   First MD Initiated Contact with Patient 04/20/15 [id number]     Chief Complaint  Patient presents with  . Chest Pain     (Consider location/radiation/quality/duration/timing/severity/associated sxs/prior Treatment) HPI Comments: Pt comes in with cc of chest pain. Chest pain is midsternal radiating to the right side, sharp, 7/10 severity pain. Pain was intemittent initially, but now constant. Pt has never had similar pain. Pain started yday. Pain is worse with inspiration. There is no exertional component. Patient has no cough. Pt has no known CAD and no recent cardiac workup. Pt has no hx of PE, DVT and denies any exogenous estrogen use, long distance travels or surgery in the past 6 weeks, active cancer, recent immobilization.  Hx of DM, HTN, HL.  ROS 10 Systems reviewed and are negative for acute change except as noted in the HPI.      Patient is a 74 y.o. male presenting with chest pain. The history is provided by the patient.  Chest Pain   Past Medical History  Diagnosis Date  . Type II or unspecified type diabetes mellitus without mention of complication, uncontrolled   . DEMENTIA   . Neuromuscular disorder   . Hypertension   . Gout   . Arthritis   . Dementia 04/24/2012  . Unspecified hereditary and idiopathic peripheral neuropathy   . Anemia, unspecified   . Hypotension, unspecified   . Acute kidney failure, unspecified   . Unspecified vitamin D deficiency   . Other and unspecified hyperlipidemia   . Hypopotassemia   . Disorder of bone and cartilage, unspecified   . Memory loss   . Hypotension, unspecified   . Acute kidney failure, unspecified   . Muscle weakness (generalized)   . Dizziness and giddiness   . Myalgia and myositis, unspecified   . Screening for ischemic heart disease   . Type II or unspecified type diabetes mellitus without mention of complication, uncontrolled   . Gout, unspecified   .  Tobacco use disorder   . Memory loss   . Special screening for malignant neoplasm of prostate    Past Surgical History  Procedure Laterality Date  . Inguinal hernia repair     Family History  Problem Relation Age of Onset  . Diabetes Mother   . Hypertension Mother   . Diabetes Father   . Hypertension Father   . Kidney disease Father   . Diabetes Sister   . Kidney disease Brother    History  Substance Use Topics  . Smoking status: Former Smoker    Quit date: 11/20/1969  . Smokeless tobacco: Never Used  . Alcohol Use: No    Review of Systems  Cardiovascular: Positive for chest pain.  All other systems reviewed and are negative.     Allergies  Penicillins  Home Medications   Prior to Admission medications   Medication Sig Start Date End Date Taking? Authorizing Provider  allopurinol (ZYLOPRIM) 100 MG tablet Take 2 tablets (200 mg total) by mouth daily. 02/25/15  Yes Sharon SellerJessica K Eubanks, NP  donepezil (ARICEPT) 10 MG tablet Start 5 mg (1/2 tablet) daily at bedtime for 1 month then increase to 10 mg (1 tablet) daily at bedtime for memory Patient taking differently: Take 5 mg by mouth at bedtime.  02/25/15  Yes Sharon SellerJessica K Eubanks, NP  gabapentin (NEURONTIN) 300 MG capsule Take 1 capsule (300 mg total) by mouth 3 (three) times daily. 09/02/14  Yes Mahima Pandey, MD  glipiZIDE (GLUCOTROL XL) 5 MG 24 hr tablet TAKE 1 TABLET BY MOUTH DAILY TO CONTROL DIABETES 03/15/15  Yes [name], NP  hydrochlorothiazide (HYDRODIURIL) 25 MG tablet TAKE 1 TABLET BY MOUTH DAILY 03/08/15  Yes [name], NP  memantine (NAMENDA) 10 MG tablet TAKE 1 TABLET BY MOUTH TWICE DAILY 03/15/15  Yes [name], NP  metFORMIN (GLUCOPHAGE) 500 MG tablet TAKE 1 TABLET BY MOUTH DAILY 10/26/14  Yes [name], NP  Multiple Vitamin (MULTIVITAMIN WITH MINERALS) TABS tablet Take 1 tablet by mouth daily.   Yes Historical Provider, MD  NAMENDA XR 28 MG CP24 TAKE ONE CAPSULE BY MOUTH DAILY 11/09/14   Yes Mahima Glade Lloyd, MD  potassium chloride SA (K-DUR,KLOR-CON) 20 MEQ tablet TAKE 2 TABLETS BY MOUTH TWICE DAILY 01/04/15  Yes [name], NP  traMADol (ULTRAM) 50 MG tablet TAKE 1-2 TABLETS BY MOUTH EVERY 8 HOURS AS NEEDED Patient taking differently: TAKE 1-2 TABLETS BY MOUTH EVERY 8 HOURS AS NEEDED FOR PAIN. 03/29/15  Yes Tiffany L Reed, DO   BP 142/77 mmHg  Pulse 108  Temp(Src) 98.7 F (37.1 C) (Oral)  Resp 23  Ht 6' (1.829 m)  Wt 170 lb (77.111 kg)  BMI 23.05 kg/m2  SpO2 95% Physical Exam  Constitutional: He is oriented to person, place, and time. He appears well-developed.  HENT:  Head: Normocephalic and atraumatic.  Eyes: Conjunctivae and EOM are normal. Pupils are equal, round, and reactive to light.  Neck: Normal range of motion. Neck supple.  Cardiovascular: Normal rate and regular rhythm.   Pulmonary/Chest: Effort normal and breath sounds normal. No respiratory distress.  Poor aeration bilaterally, worse on the right side  Abdominal: Soft. Bowel sounds are normal. He exhibits no distension. There is tenderness. There is no rebound and no guarding.  Mild tenderness in the RUQ, neg murphy's  Neurological: He is alert and oriented to person, place, and time.  Skin: Skin is warm.  Nursing note and vitals reviewed.   ED Course  Procedures (including critical care time) Labs Review Labs Reviewed  CBC - Abnormal; Notable for the following:    WBC 12.8 (*)    MCHC 36.5 (*)    All other components within normal limits  BASIC METABOLIC PANEL - Abnormal; Notable for the following:    Potassium 3.4 (*)    Chloride 97 (*)    Glucose, Bld 114 (*)    Anion gap 16 (*)    All other components within normal limits  HEPATIC FUNCTION PANEL - Abnormal; Notable for the following:    Albumin 3.4 (*)    AST 67 (*)    ALT 81 (*)    Total Bilirubin 2.0 (*)    Bilirubin, Direct 0.7 (*)    Indirect Bilirubin 1.3 (*)    All other components within normal limits  D-DIMER,  QUANTITATIVE (NOT AT [facility]) - Abnormal; Notable for the following:    D-Dimer, Quant 2.99 (*)    All other components within normal limits  BRAIN NATRIURETIC PEPTIDE  D-DIMER, QUANTITATIVE (NOT AT [facility])  Rosezena Sensor, ED  I-STAT CG4 LACTIC ACID, ED    Imaging Review Dg Chest Port 1 View  04/20/2015   CLINICAL DATA:  Right lower chest pain, shortness of breath and weakness.  EXAM: PORTABLE CHEST - 1 VIEW  COMPARISON:  12/30/2014  FINDINGS: Lung volumes are low. There is right pleural effusion and ill-defined right basilar opacity. The heart size and mediastinal  contours are unchanged allowing for differences in technique. Increased bronchovascular markings likely related to low lung volumes. No pneumothorax.  IMPRESSION: Right pleural effusion with ill-defined right basilar opacity, suspect pneumonia with parapneumonic effusion. Pleural effusion causing right basilar atelectasis is alternatively considered.   Electronically Signed   By: Rubye Oaks M.D.   On: 04/20/2015 06:07     EKG Interpretation   Date/Time:  Tuesday Apr 20 2015 05:19:24 EDT Ventricular Rate:  123 PR Interval:  141 QRS Duration: 96 QT Interval:  317 QTC Calculation: 453 R Axis:   55 Text Interpretation:  Sinus tachycardia Non-specific abnormality, ST  segment, and/or T-wave No significant change since last tracing Confirmed  by Garik Diamant, MD, Janey Genta [phone]) on 04/20/2015 5:29:56 AM      MDM   Final diagnoses:  Pleural effusion  Pleuritic chest pain  CAP (community acquired pneumonia)    Pt comes in with right sided chest pain. Chest pain is pleuritic. Patient has no orthopnea or PND, and has poor right sided aeration. Initial concer was for spontaneous pneumothorax - CXR is neg, but pt has pleural effusion and some opacity on the R side.   Pt has mild cough only, no fevers. He has mild WC. Pt also has hypoxia. Not completely convinced clinically about pneumonia, so dimer ordered, and it is elevated -  CT PE ordered. With the hypoxia, tachycardia, mild bili elevation - will start CAP antibiotics now, whilst CT PE is pending.  Pt also has some bilirubinemia. Repeat eval reveals mild tenderness. [place] abd ordered. Pt will be admitted for further differentiation and optimization.    Derwood Kaplan, MD 04/20/15 [phone]

## 2015-04-20 NOTE — ED Notes (Signed)
Attempt to give report.

## 2015-04-20 NOTE — ED Notes (Signed)
Patient returned from CT

## 2015-04-20 NOTE — ED Notes (Signed)
Floor made aware of pt now leaving the unit. Spoke with nurse about LR needing to be finished after first round of azithromycin.

## 2015-04-20 NOTE — ED Notes (Signed)
Pt in CT.

## 2015-04-20 NOTE — H&P (Signed)
[facility] History and Physical                                                                                    Gary Mcneil, is a 74 y.o. male  MRN: [id number]   DOB - Aug 08, 1941  Admit Date - 04/20/2015  Outpatient Primary MD for the patient is Lauree Chandler, NP  Referring MD: Kathrynn Humble / ER  Consulting MD: Pulmonology  With History of -  Past Medical History  Diagnosis Date  . Type II or unspecified type diabetes mellitus without mention of complication, uncontrolled   . DEMENTIA   . Neuromuscular disorder   . Hypertension   . Gout   . Arthritis   . Dementia 04/24/2012  . Unspecified hereditary and idiopathic peripheral neuropathy   . Anemia, unspecified   . Hypotension, unspecified   . Acute kidney failure, unspecified   . Unspecified vitamin D deficiency   . Other and unspecified hyperlipidemia   . Hypopotassemia   . Disorder of bone and cartilage, unspecified   . Memory loss   . Hypotension, unspecified   . Acute kidney failure, unspecified   . Muscle weakness (generalized)   . Dizziness and giddiness   . Myalgia and myositis, unspecified   . Screening for ischemic heart disease   . Type II or unspecified type diabetes mellitus without mention of complication, uncontrolled   . Gout, unspecified   . Tobacco use disorder   . Memory loss   . Special screening for malignant neoplasm of prostate       Past Surgical History  Procedure Laterality Date  . Inguinal hernia repair      in for   Chief Complaint  Patient presents with  . Chest Pain     HPI This is a 74 yo male pt w/ PMH of HTN, DM,dyslipidemia, mild dementia and chronic mild elevation AST/ALT who presents to ER w/ 2 days of right side CP. Reported to EDP pain was located in midchest and radiated to right side level 7/10. Initially duration was intermittent but has progressed to constant and has a pleuritic component. Denied cough to EDP or recent travel. EKG was unremarkable. CXR with  ill-defined right basilar opacity which could be PNA w/ effusion or pleural effusion w/atlx per radiologist.  D dimer was 2.99 and repeat D dimer increased to 3.98 prompting EDP to order CTA Chest. Also worsening of transaminitis with new elev TB 2.0 prompting order for abd [place]. Direct bilirubin is less than indirect.  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 T8 met as well as paratracheal adenopathy. Results d/w pt by my attending. Pt reports has not been able to prepare his meals due to fatigue and chest pain but says he's very hungry. Denies abdominal pain, N/V/D, SOB or cough, no blood in stools or dark stools   Review of Systems   In addition to the HPI above,  No Fever-chills, myalgias or other constitutional symptoms but ++ fatigue No Headache, changes with Vision or hearing, new weakness, tingling, numbness in any extremity, No problems swallowing  food or Liquids, indigestion/reflux No Cough or Shortness of Breath, palpitations, orthopnea or DOE No Abdominal pain, N/V; no melena or hematochezia, no dark tarry stools, Bowel movements are regular, No dysuria, hematuria or flank pain No new skin rashes, lesions, masses or bruises, No new joints pains-aches No polyuria, polydypsia or polyphagia,  *A full 10 point Review of Systems was done, except as stated above, all other Review of Systems were negative.  Social History History  Substance Use Topics  . Smoking status: Former Smoker    Quit date: 11/20/1969  . Smokeless tobacco: Never Used  . 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 Diabetes Mother   . Hypertension Mother   . Diabetes Father   . Hypertension Father     . Kidney disease Father   . Diabetes Sister   . Kidney disease Brother      Prior to Admission medications   Medication Sig Start Date End Date Taking? Authorizing Provider  allopurinol (ZYLOPRIM) 100 MG tablet Take 2 tablets (200 mg total) by mouth daily. 02/25/15  Yes Lauree Chandler, NP  donepezil (ARICEPT) 10 MG tablet Start 5 mg (1/2 tablet) daily at bedtime for 1 month then increase to 10 mg (1 tablet) daily at bedtime for memory Patient taking differently: Take 5 mg by mouth at bedtime.  02/25/15  Yes Lauree Chandler, NP  gabapentin (NEURONTIN) 300 MG capsule Take 1 capsule (300 mg total) by mouth 3 (three) times daily. 09/02/14  Yes Mahima Pandey, MD  glipiZIDE (GLUCOTROL XL) 5 MG 24 hr tablet TAKE 1 TABLET BY MOUTH DAILY TO CONTROL DIABETES 03/15/15  Yes Lauree Chandler, NP  hydrochlorothiazide (HYDRODIURIL) 25 MG tablet TAKE 1 TABLET BY MOUTH DAILY 03/08/15  Yes Lauree Chandler, NP  memantine (NAMENDA) 10 MG tablet TAKE 1 TABLET BY MOUTH TWICE DAILY 03/15/15  Yes Lauree Chandler, NP  metFORMIN (GLUCOPHAGE) 500 MG tablet TAKE 1 TABLET BY MOUTH DAILY 10/26/14  Yes Lauree Chandler, NP  Multiple Vitamin (MULTIVITAMIN WITH MINERALS) TABS tablet Take 1 tablet by mouth daily.   Yes Historical Provider, MD  NAMENDA XR 28 MG CP24 TAKE ONE CAPSULE BY MOUTH DAILY 11/09/14  Yes Mahima Pandey, MD  potassium chloride SA (K-DUR,KLOR-CON) 20 MEQ tablet TAKE 2 TABLETS BY MOUTH TWICE DAILY 01/04/15  Yes Lauree Chandler, NP  traMADol (ULTRAM) 50 MG tablet TAKE 1-2 TABLETS BY MOUTH EVERY 8 HOURS AS NEEDED Patient taking differently: TAKE 1-2 TABLETS BY MOUTH EVERY 8 HOURS AS NEEDED FOR PAIN. 03/29/15  Yes Tiffany L Reed, DO    Allergies  Allergen Reactions  . Penicillins Rash    Physical Exam  Vitals  Blood pressure 142/77, pulse 108, temperature 98.7 F (37.1 C), temperature source Oral, resp. rate 23, height 6' (1.829 m), weight 170 lb (77.111 kg), SpO2 95 %.   General:  In no acute  distress, appears stated age  Psych:  Normal affect, Denies Suicidal or Homicidal ideations, Awake Alert, Oriented X 3. Speech and thought patterns are clear and appropriate, minimal ST memory deficits  Neuro:   No focal neurological deficits, CN II through XII intact, Strength 5/5 all 4 extremities, Sensation intact all 4 extremities.  ENT:  Ears and Eyes appear Normal, Conjunctivae clear, PER. Moist oral mucosa  without erythema or exudates.  Neck:  Supple, No lymphadenopathy appreciated  Respiratory:  Symmetrical chest wall movement, Good air movement bilaterally, coarse and decreased right base, no wheeze, 2L  Cardiac:  RRR, No Murmurs, no LE edema noted, no JVD, No carotid bruits, peripheral pulses palpable at 2+  Abdomen:  Positive bowel sounds, Soft, Non tender, Non distended,  No masses appreciated, no obvious hepatosplenomegaly  Skin:  No Cyanosis, Normal Skin Turgor, No Skin Rash or Bruise.  Extremities: Symmetrical without obvious trauma or injury,  no effusions.  Data Review  CBC  Recent Labs Lab 04/20/15 0524  WBC 12.8*  HGB 14.7  HCT 40.3  PLT 184  MCV 82.6  MCH 30.1  MCHC 36.5*  RDW 14.3    Chemistries   Recent Labs Lab 04/20/15 0524 04/20/15 0604  NA 135  --   K 3.4*  --   CL 97*  --   CO2 22  --   GLUCOSE 114*  --   BUN 11  --   CREATININE 0.94  --   CALCIUM 9.3  --   AST  --  67*  ALT  --  81*  ALKPHOS  --  110  BILITOT  --  2.0*    estimated creatinine clearance is 77.5 mL/min (by C-G formula based on Cr of 0.94).  No results for input(s): TSH, T4TOTAL, T3FREE, THYROIDAB in the last 72 hours.  Invalid input(s): FREET3  Coagulation profile No results for input(s): INR, PROTIME in the last 168 hours.   Recent Labs  04/20/15 0604 04/20/15 0701  DDIMER 2.99* 3.98*    Cardiac Enzymes No results for input(s): CKMB, TROPONINI, MYOGLOBIN in the last 168 hours.  Invalid input(s): CK  Invalid input(s): POCBNP  Urinalysis      Component Value Date/Time   COLORURINE YELLOW 01/27/2014 [place] 01/27/2014 1037   LABSPEC 1.013 01/27/2014 1037   PHURINE 7.0 01/27/2014 1037   GLUCOSEU 100* 01/27/2014 1037   HGBUR TRACE* 01/27/2014 1037   BILIRUBINUR Neg 08/12/2014 1051   BILIRUBINUR NEGATIVE 01/27/2014 1037   KETONESUR NEGATIVE 01/27/2014 1037   PROTEINUR Neg 08/12/2014 1051   PROTEINUR 30* 01/27/2014 1037   UROBILINOGEN 0.2 08/12/2014 1051   UROBILINOGEN 0.2 01/27/2014 1037   NITRITE Neg 08/12/2014 1051   NITRITE NEGATIVE 01/27/2014 1037   LEUKOCYTESUR Negative 08/12/2014 1051    Imaging results:   Dg Chest Port 1 View  04/20/2015   CLINICAL DATA:  Right lower chest pain, shortness of breath and weakness.  EXAM: PORTABLE CHEST - 1 VIEW  COMPARISON:  12/30/2014  FINDINGS: Lung volumes are low. There is right pleural effusion and ill-defined right basilar opacity. The heart size and mediastinal contours are unchanged allowing for differences in technique. Increased bronchovascular markings likely related to low lung volumes. No pneumothorax.  IMPRESSION: Right pleural effusion with ill-defined right basilar opacity, suspect pneumonia with parapneumonic effusion. Pleural effusion causing right basilar atelectasis is alternatively considered.   Electronically Signed   By: Jeb Levering M.D.   On: 04/20/2015 06:07     EKG: (Independently reviewed) sinus rhythm, no acute ischemic changes, voltage criteria appears consistent with LVH   Assessment & Plan  Principal Problem:   Right-sided chest pain -admit to Telemetry -suspect due to underlying pulmonary process (see below) -IV Morphine while NPO then Oxycodone/Ultram  Active Problems:   Positive D dimer -CTA Chest negative for PE    Acute respiratory failure with hypoxia/Pleural effusion, right/pleural mass -suspect 2/2 new  finding lung mass -consult Pulmonary for eval: thoracentesis, +/- FOB for diagnosis -Does have leukocytosis so  could have mild obstructive PNA so will begin Rocephin and Zmax -cont supportive care with O2 and begin IS    HTN  -Only on diuretic -Appears dry so we'll hold for now and closely monitor blood pressure -EKG with LVH criteria so we'll check 2-D echocardiogram    DM controlled -On Glucophage and glyburide at home -Did receive IV contrast for CT so we'll hold Glucophage and utilize sliding scale insulin -resume Glyburide once proves can tolerate orals -Check hemoglobin A1c    Transaminitis -History of mild elevation in ALT and AST with normal total bilirubin -Follow up on abdominal ultrasound; 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    Hyperlipidemia -Not on medications prior to admission and suspect secondary to chronic transaminitis   Hypokalemia -Chronic in nature and secondary to Hydrodiuril; was on potassium replacement prior to admission -Replete when necessary    Protein-calorie malnutrition, severe -Nutrition evaluation -Current albumin 3.4 with total protein 7.3 but patient currently appears dehydrated    Dementia without behavioral disturbance -Was living at home and able to drive prior to admission -Continue Namenda and Aricept    DVT Prophylaxis: Lovenox  Family Communication:   No family at bedside  Code Status:  Full code  Condition:  Stable  Discharge disposition: Anticipate return to home pending full workup of pulmonary mass; suspect given underlying dementia and possible need for aggressive therapies to treat malignancy patient at minimum will require home health follow-up  Time spent in minutes : 60      Saretta Dahlem L. ANP on 04/20/2015 at 7:34 AM  Between 7am to 7pm - Pager - [phone]  After 7pm go to [email or web address] - password [email or web address]  And look for the night coverage person covering me after hours  [facility]

## 2015-04-20 NOTE — Progress Notes (Signed)
  Echocardiogram 2D Echocardiogram has been performed.  Gary SavoyCasey N Dayshawn Mcneil 04/20/2015, 3:50 PM

## 2015-04-20 NOTE — Consult Note (Signed)
Name: Gary Mcneil MRN: [id number] DOB: September 07, 1941    ADMISSION DATE:  04/20/2015 CONSULTATION DATE:  5/31  REFERRING MD :  Elgergawy   CHIEF COMPLAINT:  Lung mass and pleural effusion   BRIEF PATIENT DESCRIPTION:  74 year old male presented to ED w/ right sided CP. CT chest was obtained to r/o PE given + d dimer. This was negative for PE, but eval showed Large right para-tracheal lung mass and loculated pleural effusion, for which PCCM was asked to eval. . His room air sats were 88% on arrival to ER   >father had lung cancer/ brother died of esophageal cancer. He stopped smoking at age 74. Had smoked about 10 cigarettes a day since age 70.    SIGNIFICANT EVENTS   STUDIES:  CT chest 5/31: neg for PE, mass like consolidation extending from the right lung apex inferiorly along the mediastinum. Non specific 1 cm sclerotic focus at T8 vertebral body, mod to small loculated right effusion, RLL consolidation and atx  HISTORY OF PRESENT ILLNESS:   This is a 74 year old AAM who presented to the [facility] on 5/31 w/ cc: 2 day h/o right sided CP. He described the pain as localized to right chest, pleuritic in nature initially, progressed to more constant. Denies sick exposure, fever, chills, cough. Has had some exertional dyspnea over the last year which he attributed to aging and slowed his activity accordingly. He still stays active, lives alone, drives and is independent in all ADLs. On Er eval CT chest was obtained: showed Large right para-tracheal lung mass and loculated pleural effusion, for which PCCM was asked to eval. . His room air sats were 88% on arrival to ER    PAST MEDICAL HISTORY :   has a past medical history of Type II or unspecified type diabetes mellitus without mention of complication, uncontrolled; DEMENTIA; Neuromuscular disorder; Hypertension; Gout; Arthritis; Dementia (04/24/2012); Unspecified hereditary and idiopathic peripheral neuropathy; Anemia, unspecified; Hypotension,  unspecified; Acute kidney failure, unspecified; Unspecified vitamin D deficiency; Other and unspecified hyperlipidemia; Hypopotassemia; Disorder of bone and cartilage, unspecified; Memory loss; Hypotension, unspecified; Acute kidney failure, unspecified; Muscle weakness (generalized); Dizziness and giddiness; Myalgia and myositis, unspecified; Screening for ischemic heart disease; Type II or unspecified type diabetes mellitus without mention of complication, uncontrolled; Gout, unspecified; Tobacco use disorder; Memory loss; and Special screening for malignant neoplasm of prostate.  has past surgical history that includes Inguinal hernia repair. Prior to Admission medications   Medication Sig Start Date End Date Taking? Authorizing Provider  allopurinol (ZYLOPRIM) 100 MG tablet Take 2 tablets (200 mg total) by mouth daily. 02/25/15  Yes [name], NP  donepezil (ARICEPT) 10 MG tablet Start 5 mg (1/2 tablet) daily at bedtime for 1 month then increase to 10 mg (1 tablet) daily at bedtime for memory Patient taking differently: Take 5 mg by mouth at bedtime.  02/25/15  Yes [name], NP  gabapentin (NEURONTIN) 300 MG capsule Take 1 capsule (300 mg total) by mouth 3 (three) times daily. 09/02/14  Yes Mahima Pandey, MD  glipiZIDE (GLUCOTROL XL) 5 MG 24 hr tablet TAKE 1 TABLET BY MOUTH DAILY TO CONTROL DIABETES 03/15/15  Yes [name], NP  hydrochlorothiazide (HYDRODIURIL) 25 MG tablet TAKE 1 TABLET BY MOUTH DAILY 03/08/15  Yes [name], NP  memantine (NAMENDA) 10 MG tablet TAKE 1 TABLET BY MOUTH TWICE DAILY 03/15/15  Yes [name], NP  metFORMIN (GLUCOPHAGE) 500 MG tablet TAKE 1 TABLET BY  MOUTH DAILY 10/26/14  Yes [name], NP  Multiple Vitamin (MULTIVITAMIN WITH MINERALS) TABS tablet Take 1 tablet by mouth daily.   Yes Historical Provider, MD  NAMENDA XR 28 MG CP24 TAKE ONE CAPSULE BY MOUTH DAILY 11/09/14  Yes Mahima Pandey, MD  potassium chloride SA  (K-DUR,KLOR-CON) 20 MEQ tablet TAKE 2 TABLETS BY MOUTH TWICE DAILY 01/04/15  Yes [name], NP  traMADol (ULTRAM) 50 MG tablet TAKE 1-2 TABLETS BY MOUTH EVERY 8 HOURS AS NEEDED Patient taking differently: TAKE 1-2 TABLETS BY MOUTH EVERY 8 HOURS AS NEEDED FOR PAIN. 03/29/15  Yes Tiffany L Reed, DO   Allergies  Allergen Reactions  . Penicillins Rash    FAMILY HISTORY:  family history includes Diabetes in his father, mother, and sister; Hypertension in his father and mother; Kidney disease in his brother and father.  (see above) SOCIAL HISTORY:  reports that he quit smoking about 45 years ago. He has never used smokeless tobacco. He reports that he does not drink alcohol or use illicit drugs. Retired Designer, industrial/product  REVIEW OF SYSTEMS:   Constitutional: Negative for fever, chills, weight loss, malaise/fatigue easier over last year  and diaphoresis.  HENT: Negative for hearing loss, ear pain, nosebleeds, congestion, sore throat, neck pain, tinnitus and ear discharge.   Eyes: Negative for blurred vision, double vision, photophobia, pain, discharge and redness.  Respiratory: Negative for cough, hemoptysis, sputum production, shortness of breath, associated w/ chest pain, wheezing and stridor.   Cardiovascular: Negative for chest pain, palpitations, orthopnea, claudication, leg swelling and PND.  Gastrointestinal: Negative for heartburn, nausea, vomiting, abdominal pain, diarrhea, constipation, blood in stool and melena.  Genitourinary: Negative for dysuria, urgency, frequency, hematuria and flank pain.  Musculoskeletal: Negative for myalgias, back pain, joint pain and falls.  Skin: Negative for itching and rash.  Neurological: Negative for dizziness, tingling, tremors, sensory change, speech change, focal weakness, seizures, loss of consciousness, weakness and headaches.  Endo/Heme/Allergies: Negative for environmental allergies and polydipsia. Does not bruise/bleed easily.  SUBJECTIVE: right  chest hurts  VITAL SIGNS: Temp:  [98.7 F (37.1 C)-99.2 F (37.3 C)] 99.2 F (37.3 C) (05/31 0812) Pulse Rate:  [107-125] 108 (05/31 0645) Resp:  [17-26] 23 (05/31 0645) BP: (114-146)/(62-89) 142/77 mmHg (05/31 0645) SpO2:  [91 %-95 %] 95 % (05/31 0645) Weight:  [77.111 kg (170 lb)] 77.111 kg (170 lb) (05/31 0520)  PHYSICAL EXAMINATION: General:  Well nourished 74 year old AAM , no distress, but does have right sided CP Neuro:  Awake, oriented, no focal def  HEENT:  [place], no JVD  Cardiovascular:  rrr Lungs: crackles right base  Abdomen:  Soft, non-tender Musculoskeletal:  intact Skin:  intact   Recent Labs Lab 04/20/15 0524  NA 135  K 3.4*  CL 97*  CO2 22  BUN 11  CREATININE 0.94  GLUCOSE 114*    Recent Labs Lab 04/20/15 0524  HGB 14.7  HCT 40.3  WBC 12.8*  PLT 184   Ct Angio Chest Pe W/cm &/or Wo Cm  04/20/2015   CLINICAL DATA:  74 year old male with chest pain and cough  EXAM: CT ANGIOGRAPHY CHEST WITH CONTRAST  TECHNIQUE: Multidetector CT imaging of the chest was performed using the standard protocol during bolus administration of intravenous contrast. Multiplanar CT image reconstructions and MIPs were obtained to evaluate the vascular anatomy.  CONTRAST:  80mL OMNIPAQUE IOHEXOL 350 MG/ML SOLN  COMPARISON:  Chest x-ray obtained earlier today ; prior chest x-ray 12/30/2014; prior chest CT 10/16/2005  FINDINGS: Mediastinum: Low right paratracheal lymph node measures 1.4 cm in short axis. Masslike consolidation centered in the posteromedial aspect of the right upper lobe partially encircles the trachea and abuts the esophagus within the middle mediastinum. A faint tissue plane remains present between the esophagus and the soft tissue mass. The esophagus itself is otherwise unremarkable. No anterior mediastinal mass. Thoracic inlet and thyroid gland are unremarkable.  Heart/Vascular: Adequate opacification of the pulmonary arteries to the proximal subsegmental level. No  evidence of central filling defect to suggest acute pulmonary embolus. The main pulmonary artery is enlarged measuring approximately 4.2 cm in diameter. This suggests underlying pulmonary arterial hypertension. Atherosclerotic calcifications are present along the course of the right and left anterior descending coronary arteries. The heart is within normal limits for size. No pericardial effusion. Normal caliber 3 vessel aorta.  Lungs/Pleura: Somewhat amorphous elongated region of masslike consolidation in the posteromedial aspect of the right upper lobe extending from the apex down along the trachea to the right hilum. This masslike consolidation appears to extend into the adjacent mediastinal fat. The mass is largest in craniocaudal dimension were in measures 9.2 cm. On the axial images the mass measures up to 5.6 x 4.4 cm. There is a partially loculated pleural effusion with a loculated fluid extending into the minor fissure. Associated right lower lobe atelectasis with volume loss and slight right-to-left shift of the mediastinal structures. Small volume superimposed consolidation in the inferior right lower lobe. Background of paraseptal emphysema. The left lung is clear.  Bones/Soft Tissues: No evidence of acute fracture. Nonspecific 1 cm (best measured on coronal image 91 of series 7) sclerotic focus in the posterior aspect of the T8 vertebral body just left of midline.  Upper Abdomen: Incompletely imaged water attenuation lesion in the upper pole of the left kidney likely represents a simple cyst. Otherwise, the visualized upper abdomen is unremarkable. The new  Review of the MIP images confirms the above findings.  IMPRESSION: 1. Negative for pulmonary embolism. 2. Masslike consolidation extending from the right lung apex inferiorly along the mediastinum to the hilum. This region measures up to 9.2 cm in maximal dimension and is concerning for primary bronchogenic carcinoma. A masslike region of pneumonia,  or other infectious/inflammatory process is possible but considered less likely. Recommend further evaluation with bronchoscopy. If the bronchoscopic and clinical findings are also consistent with malignancy, staging PET-CT may be warranted. 3. Nonspecific 1 cm sclerotic focus in the T8 vertebral body concerning for possible osseous metastatic disease. 4. Mildly enlarged low right paratracheal lymph node concerning for metastatic disease versus reactive adenopathy. 5. Small to moderate loculated right pleural effusion is presumed malignant or parapneumonic. 6. Right lower lobe atelectasis with a small amount of superimposed consolidation which may represent pneumonia or pneumonitis. 7. Atherosclerotic calcification along the course of the coronary arteries. Please note that although the presence of coronary artery calcium documents the presence of coronary artery disease, the severity of this disease and any potential stenosis cannot be assessed on this non-gated CT examination. Assessment for potential risk factor modification, dietary therapy or pharmacologic therapy may be warranted, if clinically indicated. 8. Enlarged main pulmonary artery as can be seen in the setting of pulmonary arterial hypertension.   Electronically Signed   By: Malachy Moan M.D.   On: 04/20/2015 08:07   Dg Chest Port 1 View  04/20/2015   CLINICAL DATA:  Right lower chest pain, shortness of breath and weakness.  EXAM: PORTABLE CHEST - 1 VIEW  COMPARISON:  12/30/2014  FINDINGS: Lung volumes are low. There is right pleural effusion and ill-defined right basilar opacity. The heart size and mediastinal contours are unchanged allowing for differences in technique. Increased bronchovascular markings likely related to low lung volumes. No pneumothorax.  IMPRESSION: Right pleural effusion with ill-defined right basilar opacity, suspect pneumonia with parapneumonic effusion. Pleural effusion causing right basilar atelectasis is  alternatively considered.   Electronically Signed   By: Rubye OaksMelanie  Ehinger M.D.   On: 04/20/2015 06:07    ASSESSMENT / PLAN:  Large Right upper lobe/Paratracheal lung mass Small/mod loculated right effusion (not amendable to bedside thoracentesis by US eval 5/31) RLL atelectasis vs post-obstructive PNA  Acute hypoxic respiratory failure  Right sided Chest pain  Abnormal LFTs  Discussion  Highly concerning for malignant process.  Plan/rec Will discuss bronchoscopic evaluation with hopes to get tissue bx w/ Dr Sung AmabileSimonds. Would continue empiric abx, likely does not need atypical coverage PRN toradol for pleuritic pain    [facility] Pager: [phone]  04/20/2015, 9:02 AM  PCCM ATTENDING: I have reviewed pt's initial presentation, consultants notes and hospital database in detail.    In summary: The most important findings are those on the CT chest. There appears to be a large paratracheal mass, probable RUL mass and small loculated R effusion. The constellation of CT findings suggests a malignancy. The location of the dominant paratracheal mass is not easily accessible to bronchoscopic biopsy (it is difficult to obtain specimens in the mid trachea). I have discussed with Dr Lowella DandyHenn of IR who believes that he can obtain a diagnostic specimen of pleural fluid or perhaps perform TTNA of the posterior RUL mass. I have placed order for this. PCCM will continue to follow and if that specimen is nondiagnostic, we will reconsider FOB   Billy Fischeravid Simonds, MD ; [facility] [phone].  After 5:30 PM or weekends, call [phone]

## 2015-04-21 ENCOUNTER — Other Ambulatory Visit: Payer: Self-pay | Admitting: Radiology

## 2015-04-21 DIAGNOSIS — R079 Chest pain, unspecified: Secondary | ICD-10-CM

## 2015-04-21 DIAGNOSIS — E43 Unspecified severe protein-calorie malnutrition: Secondary | ICD-10-CM

## 2015-04-21 DIAGNOSIS — I10 Essential (primary) hypertension: Secondary | ICD-10-CM

## 2015-04-21 DIAGNOSIS — J9601 Acute respiratory failure with hypoxia: Secondary | ICD-10-CM

## 2015-04-21 LAB — CBC
HCT: 33.6 % — ABNORMAL LOW (ref 39.0–52.0)
HEMOGLOBIN: 12.2 g/dL — AB (ref 13.0–17.0)
MCH: 29.8 pg (ref 26.0–34.0)
MCHC: 36.3 g/dL — ABNORMAL HIGH (ref 30.0–36.0)
MCV: 82.2 fL (ref 78.0–100.0)
Platelets: 185 10*3/uL (ref 150–400)
RBC: 4.09 MIL/uL — AB (ref 4.22–5.81)
RDW: 14.3 % (ref 11.5–15.5)
WBC: 12.6 10*3/uL — AB (ref 4.0–10.5)

## 2015-04-21 LAB — COMPREHENSIVE METABOLIC PANEL
ALT: 50 U/L (ref 17–63)
AST: 40 U/L (ref 15–41)
Albumin: 2.6 g/dL — ABNORMAL LOW (ref 3.5–5.0)
Alkaline Phosphatase: 101 U/L (ref 38–126)
Anion gap: 12 (ref 5–15)
BUN: 16 mg/dL (ref 6–20)
CALCIUM: 8.3 mg/dL — AB (ref 8.9–10.3)
CO2: 23 mmol/L (ref 22–32)
Chloride: 102 mmol/L (ref 101–111)
Creatinine, Ser: 0.94 mg/dL (ref 0.61–1.24)
Glucose, Bld: 93 mg/dL (ref 65–99)
POTASSIUM: 3.1 mmol/L — AB (ref 3.5–5.1)
SODIUM: 137 mmol/L (ref 135–145)
Total Bilirubin: 2.6 mg/dL — ABNORMAL HIGH (ref 0.3–1.2)
Total Protein: 6.1 g/dL — ABNORMAL LOW (ref 6.5–8.1)

## 2015-04-21 LAB — PROTIME-INR
INR: 1.4 (ref 0.00–1.49)
PROTHROMBIN TIME: 17.3 s — AB (ref 11.6–15.2)

## 2015-04-21 LAB — HEMOGLOBIN A1C
HEMOGLOBIN A1C: 6.5 % — AB (ref 4.8–5.6)
Mean Plasma Glucose: 140 mg/dL

## 2015-04-21 LAB — GLUCOSE, CAPILLARY: Glucose-Capillary: 86 mg/dL (ref 65–99)

## 2015-04-21 LAB — APTT: aPTT: 40 seconds — ABNORMAL HIGH (ref 24–37)

## 2015-04-21 MED ORDER — IBUPROFEN 200 MG PO TABS
400.0000 mg | ORAL_TABLET | Freq: Once | ORAL | Status: AC
  Administered 2015-04-21: 400 mg via ORAL
  Filled 2015-04-21: qty 2

## 2015-04-21 MED ORDER — LEVOFLOXACIN 500 MG PO TABS: 500.0000 mg | ORAL_TABLET | Freq: Every day | ORAL | Status: DC

## 2015-04-21 NOTE — Progress Notes (Signed)
Pt was very anxious to leave. He declined his IV antibiotics prior to leaving and then took out his own IV when nurse out of room. I instructed him to obtain his prescription for antibiotic and begin taking it today. Discharge instructions given with appointment for biopsy tomorrow morning. Pt verbalized understanding of and planned compliance with all instructions. Taxi called for pt and pt taken to front entrance via wheel chair.

## 2015-04-21 NOTE — Discharge Summary (Signed)
Physician Discharge Summary  Gary Mcneil NID:[id number] DOB: [birth year] DOA: 04/20/2015  PCP: Lauree Chandler, NP  Admit date: 04/20/2015 Discharge date: 04/21/2015  Time spent: > 30 minutes  Recommendations for Outpatient Follow-up:  1. Follow up with IR on 6/2 at 9 am for biopsy  2. Continue Levofloxacin for 5 additional days 3. Follow up with Dr. Dewaine Oats in 1 week once biopsy results are back   Discharge Diagnoses:  Principal Problem:   Right-sided chest pain Active Problems:   HTN (hypertension)   DM (diabetes mellitus)   Hyperlipidemia   Protein-calorie malnutrition, severe   Dementia without behavioral disturbance   Transaminitis   Positive D dimer   Acute respiratory failure with hypoxia   Pleural effusion, right   Hypokalemia  Discharge Condition: stable  Diet recommendation: heart healthy  Filed Weights   04/20/15 0520 04/20/15 1100 04/21/15 0353  Weight: 77.111 kg (170 lb) 73.1 kg (161 lb 2.5 oz) 75.161 kg (165 lb 11.2 oz)   History of present illness:  This is a 74 yo male pt w/ PMH of HTN, DM,dyslipidemia, mild dementia and chronic mild elevation AST/ALT who presents to ER w/ 2 days of right side CP. Reported to EDP pain was located in midchest and radiated to right side level 7/10. Initially duration was intermittent but has progressed to constant and has a pleuritic component. Denied cough to EDP or recent travel. EKG was unremarkable. CXR with ill-defined right basilar opacity which could be PNA w/ effusion or pleural effusion w/atlx per radiologist. D dimer was 2.99 and repeat D dimer increased to 3.98 prompting EDP to order CTA Chest. Also worsening of transaminitis with new elev TB 2.0 prompting order for abd [place]. Direct bilirubin is less than indirect. 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 T8 met as well as paratracheal adenopathy. Results d/w pt by my attending. Pt  reports has not been able to prepare his meals due to fatigue and chest pain but says he's very hungry. Denies abdominal pain, N/V/D, SOB or cough, no blood in stools or dark stools  Hospital Course:  Patient was admitted to the hospital with right sided chest pain. On CT scan was evident that patient had a lung mass with minimal right sided pleural effusion, this is highly suspicious for malignancy. Pulmonology was consulted for evaluation for a bronchoscopy with biopsy however it was felt like this is better to be done by IR. Patient was supposed to get his biopsy on 6/1 however due to heavy schedule IR could not perform the biopsy on 6/1. Patient was extremely upset and insisted to go home and he was very adamant that this procedure is done as an outpatient as he "can wait at home". His vital signs were within normal limits, satting well on room air without dyspnea, his labs were without significant abnormalities and patient was discharged home in stable condition and will undergo IR guided biopsy on 6/2 at 9 am. I discussed with the patient regarding his need for biopsy, he expressed understanding, and later in the day after discharge I have discussed with patient's son over the phone and relayed the plan. He will bring the patient to the hospital and will drive the patient home after procedure. Patient has mild dementia however is highly functional, is driving and has good insight into the CT findings and the upcoming procedure. He was found to have mild isolated  elevation of his bilirubin and underwent a RUQ [place] which showed cholelithiasis without CBD dilation, negative Muprhy sign. Patient is asymptomatic, no nausea/vomiting or abdominal pain and is able to tolerate a diet. Recommend to have repeat CMP next week as an outpatient.   Procedures:  2D echo  Impressions: - LVEF 55-60%, mild LVH, diastolic dysfunction, normal LV fillingpressure, mild TR, top normal RVSP.   Consultations:  Pulmonary    Discharge Exam: Filed Vitals:   04/20/15 1100 04/20/15 1355 04/20/15 1945 04/21/15 0353  BP: 134/77 146/78 151/81 125/80  Pulse: 109 106 107 93  Temp: 99.3 F (37.4 C) 98.9 F (37.2 C) 99.3 F (37.4 C) 97.8 F (36.6 C)  TempSrc: Oral Oral Oral Oral  Resp: _0 Height: _1  (1.854 m)     Weight: 73.1 kg (161 lb 2.5 oz)   75.161 kg (165 lb 11.2 oz)  SpO2: 96% 99% 93% 96%   General: NAD Cardiovascular: RRR Respiratory: no wheezing, decreased breath sounds right lower side  Discharge Instructions    Medication List    TAKE these medications        allopurinol 100 MG tablet  Commonly known as:  ZYLOPRIM  Take 2 tablets (200 mg total) by mouth daily.     donepezil 10 MG tablet  Commonly known as:  ARICEPT  Start 5 mg (1/2 tablet) daily at bedtime for 1 month then increase to 10 mg (1 tablet) daily at bedtime for memory     gabapentin 300 MG capsule  Commonly known as:  NEURONTIN  Take 1 capsule (300 mg total) by mouth 3 (three) times daily.     glipiZIDE 5 MG 24 hr tablet  Commonly known as:  GLUCOTROL XL  TAKE 1 TABLET BY MOUTH DAILY TO CONTROL DIABETES     hydrochlorothiazide 25 MG tablet  Commonly known as:  HYDRODIURIL  TAKE 1 TABLET BY MOUTH DAILY     levofloxacin 500 MG tablet  Commonly known as:  LEVAQUIN  Take 1 tablet (500 mg total) by mouth daily.     metFORMIN 500 MG tablet  Commonly known as:  GLUCOPHAGE  TAKE 1 TABLET BY MOUTH DAILY     multivitamin with minerals Tabs tablet  Take 1 tablet by mouth daily.     NAMENDA XR 28 MG Cp24 24 hr capsule  Generic drug:  memantine  TAKE ONE CAPSULE BY MOUTH DAILY     memantine 10 MG tablet  Commonly known as:  NAMENDA  TAKE 1 TABLET BY MOUTH TWICE DAILY     potassium chloride SA 20 MEQ tablet  Commonly known as:  K-DUR,KLOR-CON  TAKE 2 TABLETS BY MOUTH TWICE DAILY     traMADol 50 MG tablet  Commonly known as:  ULTRAM  TAKE 1-2 TABLETS BY MOUTH EVERY 8 HOURS AS NEEDED            Follow-up Information    Follow up with Dewaine Oats, JESSICA K, NP. Schedule an appointment as soon as possible for a visit in 1 week.   Specialty:  Nurse Practitioner   Contact information:   [address] [place] [address] [phone]       The results of significant diagnostics from this hospitalization (including imaging, microbiology, ancillary and laboratory) are listed below for reference.    Significant Diagnostic Studies: Ct Angio Chest Pe W/cm &/or Wo Cm  04/20/2015   CLINICAL DATA:  74 year old male with chest pain and cough  EXAM: CT ANGIOGRAPHY CHEST  WITH CONTRAST  TECHNIQUE: Multidetector CT imaging of the chest was performed using the standard protocol during bolus administration of intravenous contrast. Multiplanar CT image reconstructions and MIPs were obtained to evaluate the vascular anatomy.  CONTRAST:  93m OMNIPAQUE IOHEXOL 350 MG/ML SOLN  COMPARISON:  Chest x-ray obtained earlier today ; prior chest x-ray 12/30/2014; prior chest CT 10/16/2005  FINDINGS: Mediastinum: Low right paratracheal lymph node measures 1.4 cm in short axis. Masslike consolidation centered in the posteromedial aspect of the right upper lobe partially encircles the trachea and abuts the esophagus within the middle mediastinum. A faint tissue plane remains present between the esophagus and the soft tissue mass. The esophagus itself is otherwise unremarkable. No anterior mediastinal mass. Thoracic inlet and thyroid gland are unremarkable.  Heart/Vascular: Adequate opacification of the pulmonary arteries to the proximal subsegmental level. No evidence of central filling defect to suggest acute pulmonary embolus. The main pulmonary artery is enlarged measuring approximately 4.2 cm in diameter. This suggests underlying pulmonary arterial hypertension. Atherosclerotic calcifications are present along the course of the right and left anterior descending coronary arteries. The heart is within normal limits for  size. No pericardial effusion. Normal caliber 3 vessel aorta.  Lungs/Pleura: Somewhat amorphous elongated region of masslike consolidation in the posteromedial aspect of the right upper lobe extending from the apex down along the trachea to the right hilum. This masslike consolidation appears to extend into the adjacent mediastinal fat. The mass is largest in craniocaudal dimension were in measures 9.2 cm. On the axial images the mass measures up to 5.6 x 4.4 cm. There is a partially loculated pleural effusion with a loculated fluid extending into the minor fissure. Associated right lower lobe atelectasis with volume loss and slight right-to-left shift of the mediastinal structures. Small volume superimposed consolidation in the inferior right lower lobe. Background of paraseptal emphysema. The left lung is clear.  Bones/Soft Tissues: No evidence of acute fracture. Nonspecific 1 cm (best measured on coronal image 91 of series 7) sclerotic focus in the posterior aspect of the T8 vertebral body just left of midline.  Upper Abdomen: Incompletely imaged water attenuation lesion in the upper pole of the left kidney likely represents a simple cyst. Otherwise, the visualized upper abdomen is unremarkable. The new  Review of the MIP images confirms the above findings.  IMPRESSION: 1. Negative for pulmonary embolism. 2. Masslike consolidation extending from the right lung apex inferiorly along the mediastinum to the hilum. This region measures up to 9.2 cm in maximal dimension and is concerning for primary bronchogenic carcinoma. A masslike region of pneumonia, or other infectious/inflammatory process is possible but considered less likely. Recommend further evaluation with bronchoscopy. If the bronchoscopic and clinical findings are also consistent with malignancy, staging PET-CT may be warranted. 3. Nonspecific 1 cm sclerotic focus in the T8 vertebral body concerning for possible osseous metastatic disease. 4. Mildly  enlarged low right paratracheal lymph node concerning for metastatic disease versus reactive adenopathy. 5. Small to moderate loculated right pleural effusion is presumed malignant or parapneumonic. 6. Right lower lobe atelectasis with a small amount of superimposed consolidation which may represent pneumonia or pneumonitis. 7. Atherosclerotic calcification along the course of the coronary arteries. Please note that although the presence of coronary artery calcium documents the presence of coronary artery disease, the severity of this disease and any potential stenosis cannot be assessed on this non-gated CT examination. Assessment for potential risk factor modification, dietary therapy or pharmacologic therapy may be warranted, if clinically indicated. 8. Enlarged  main pulmonary artery as can be seen in the setting of pulmonary arterial hypertension.   Electronically Signed   By: Jacqulynn Cadet M.D.   On: 04/20/2015 08:07   US Abdomen Limited  04/20/2015   CLINICAL DATA:  Abdominal pain.  Chest pain since 3 a.m.  EXAM: US ABDOMEN LIMITED - RIGHT UPPER QUADRANT  COMPARISON:  Chest CT 04/20/2015  FINDINGS: Gallbladder:  Multiple echogenic foci in the gallbladder are compatible with stones and sludge. Gallbladder wall is not thickened, measuring up to 2 mm. The patient does not have a sonographic Murphy's sign.  Common bile duct:  Diameter: 3 mm  Liver:  No focal lesion identified. Within normal limits in parenchymal echogenicity. Main portal vein is patent.  IMPRESSION: Cholelithiasis.  No biliary dilatation.   Electronically Signed   By: Markus Daft M.D.   On: 04/20/2015 09:26   Dg Chest Port 1 View  04/20/2015   CLINICAL DATA:  Right lower chest pain, shortness of breath and weakness.  EXAM: PORTABLE CHEST - 1 VIEW  COMPARISON:  12/30/2014  FINDINGS: Lung volumes are low. There is right pleural effusion and ill-defined right basilar opacity. The heart size and mediastinal contours are unchanged allowing for  differences in technique. Increased bronchovascular markings likely related to low lung volumes. No pneumothorax.  IMPRESSION: Right pleural effusion with ill-defined right basilar opacity, suspect pneumonia with parapneumonic effusion. Pleural effusion causing right basilar atelectasis is alternatively considered.   Electronically Signed   By: Jeb Levering M.D.   On: 04/20/2015 06:07    Microbiology: Recent Results (from the past 240 hour(s))  Culture, blood (routine x 2)     Status: None (Preliminary result)   Collection Time: 04/20/15  8:40 AM  Result Value Ref Range Status   Specimen Description BLOOD ARM RIGHT  Final   Special Requests BOTTLES DRAWN AEROBIC AND ANAEROBIC 5CC  Final   Culture   Final           BLOOD CULTURE RECEIVED NO GROWTH TO DATE CULTURE WILL BE HELD FOR 5 DAYS BEFORE ISSUING A FINAL NEGATIVE REPORT Performed at [facility]    Report Status PENDING  Incomplete  Culture, blood (routine x 2)     Status: None (Preliminary result)   Collection Time: 04/20/15  8:48 AM  Result Value Ref Range Status   Specimen Description BLOOD RIGHT FOREARM  Final   Special Requests BOTTLES DRAWN AEROBIC AND ANAEROBIC 5CC  Final   Culture   Final           BLOOD CULTURE RECEIVED NO GROWTH TO DATE CULTURE WILL BE HELD FOR 5 DAYS BEFORE ISSUING A FINAL NEGATIVE REPORT Performed at [facility]    Report Status PENDING  Incomplete     Labs: Basic Metabolic Panel:  Recent Labs Lab 04/20/15 0524 04/21/15 0409  NA 135 137  K 3.4* 3.1*  CL 97* 102  CO2 22 23  GLUCOSE 114* 93  BUN 11 16  CREATININE 0.94 0.94  CALCIUM 9.3 8.3*   Liver Function Tests:  Recent Labs Lab 04/20/15 0604 04/21/15 0409  AST 67* 40  ALT 81* 50  ALKPHOS 110 101  BILITOT 2.0* 2.6*  PROT 7.3 6.1*  ALBUMIN 3.4* 2.6*   CBC:  Recent Labs Lab 04/20/15 0524 04/21/15 0409  WBC 12.8* 12.6*  HGB 14.7 12.2*  HCT 40.3 33.6*  MCV 82.6 82.2  PLT 184 185   BNP: BNP (last 3  results)  Recent Labs  04/20/15  0524  BNP 66.8   CBG:  Recent Labs Lab 04/20/15 1111 04/20/15 1602 04/20/15 2135 04/21/15 0614  GLUCAP 120* 91 103* 86   Signed:  Samyria Rudie  [facility] 04/21/2015, 4:27 PM

## 2015-04-21 NOTE — Progress Notes (Signed)
Patient ID: Edwin DadaKenneth E Carstarphen, male   DOB: 08/09/1941, 74 y.o.   MRN: [id number]   Request for R paratracheal mass bx in IR  Approved with Dr Lowella DandyHenn 5/31  Unfortunately unable to accommodate in schedule in IR today (6/1) Planned for 6/2 for bx  Pt wants to leave and have bx as OP Arranged 6/2 900 am [facility]x [facility] 6/2 Npo after MN Must have driver back and forth and someone at home overnight after bx  RN has been given all info All info to pt with good understanding

## 2015-04-21 NOTE — Discharge Instructions (Signed)
Follow with Janyth ContesEUBANKS, JESSICA K, NP in 5-7 days  Please get a complete blood count and chemistry panel checked by your Primary MD at your next visit, and again as instructed by your Primary MD. Please get your medications reviewed and adjusted by your Primary MD.  Please request your Primary MD to go over all Hospital Tests and Procedure/Radiological results at the follow up, please get all Hospital records sent to your Prim MD by signing hospital release before you go home.  If you had Pneumonia of Lung problems at the Hospital: Please get a 2 view Chest X ray done in 6-8 weeks after hospital discharge or sooner if instructed by your Primary MD.  If you have Congestive Heart Failure: Please call your Cardiologist or Primary MD anytime you have any of the following symptoms:  1) 3 pound weight gain in 24 hours or 5 pounds in 1 week  2) shortness of breath, with or without a dry hacking cough  3) swelling in the hands, feet or stomach  4) if you have to sleep on extra pillows at night in order to breathe  Follow cardiac low salt diet and 1.5 lit/day fluid restriction.  If you have diabetes Accuchecks 4 times/day, Once in AM empty stomach and then before each meal. Log in all results and show them to your primary doctor at your next visit. If any glucose reading is under 80 or above 300 call your primary MD immediately.  If you have Seizure/Convulsions/Epilepsy: Please do not drive, operate heavy machinery, participate in activities at heights or participate in high speed sports until you have seen by Primary MD or a Neurologist and advised to do so again.  If you had Gastrointestinal Bleeding: Please ask your Primary MD to check a complete blood count within one week of discharge or at your next visit. Your endoscopic/colonoscopic biopsies that are pending at the time of discharge, will also need to followed by your Primary MD.  Get Medicines reviewed and adjusted. Please take all your  medications with you for your next visit with your Primary MD  Please request your Primary MD to go over all hospital tests and procedure/radiological results at the follow up, please ask your Primary MD to get all Hospital records sent to his/her office.  If you experience worsening of your admission symptoms, develop shortness of breath, life threatening emergency, suicidal or homicidal thoughts you must seek medical attention immediately by calling 911 or calling your MD immediately  if symptoms less severe.  You must read complete instructions/literature along with all the possible adverse reactions/side effects for all the Medicines you take and that have been prescribed to you. Take any new Medicines after you have completely understood and accpet all the possible adverse reactions/side effects.   Do not drive or operate heavy machinery when taking Pain medications.   Do not take more than prescribed Pain, Sleep and Anxiety Medications  Special Instructions: If you have smoked or chewed Tobacco  in the last 2 yrs please stop smoking, stop any regular Alcohol  and or any Recreational drug use.  Wear Seat belts while driving.  Please note You were cared for by a hospitalist during your hospital stay. If you have any questions about your discharge medications or the care you received while you were in the hospital after you are discharged, you can call the unit and asked to speak with the hospitalist on call if the hospitalist that took care of you is not available.  Once you are discharged, your primary care physician will handle any further medical issues. Please note that NO REFILLS for any discharge medications will be authorized once you are discharged, as it is imperative that you return to your primary care physician (or establish a relationship with a primary care physician if you do not have one) for your aftercare needs so that they can reassess your need for medications and monitor your  lab values.  You can reach the hospitalist office at phone [phone] or fax [phone]   If you do not have a primary care physician, you can call [phone] for a physician referral.  Activity: As tolerated with Full fall precautions use walker/cane & assistance as needed  Diet: regular  Disposition Home

## 2015-04-22 ENCOUNTER — Ambulatory Visit (HOSPITAL_COMMUNITY)
Admission: RE | Admit: 2015-04-22 | Discharge: 2015-04-22 | Disposition: A | Discharge status: Home / Self Care | Payer: Medicare Other | Source: Ambulatory Visit | Attending: Pulmonary Disease | Admitting: Pulmonary Disease

## 2015-04-22 ENCOUNTER — Other Ambulatory Visit: Payer: Self-pay | Admitting: Pulmonary Disease

## 2015-04-22 ENCOUNTER — Ambulatory Visit (HOSPITAL_COMMUNITY): Admit: 2015-04-22 | Payer: Medicare Other

## 2015-04-22 DIAGNOSIS — R918 Other nonspecific abnormal finding of lung field: Secondary | ICD-10-CM

## 2015-04-22 DIAGNOSIS — Z01812 Encounter for preprocedural laboratory examination: Secondary | ICD-10-CM | POA: Insufficient documentation

## 2015-04-22 DIAGNOSIS — Z538 Procedure and treatment not carried out for other reasons: Secondary | ICD-10-CM

## 2015-04-22 LAB — PROTIME-INR
INR: 1.37 (ref 0.00–1.49)
Prothrombin Time: 16.9 seconds — ABNORMAL HIGH (ref 11.6–15.2)

## 2015-04-22 LAB — CBC
HEMATOCRIT: 36.8 % — AB (ref 39.0–52.0)
Hemoglobin: 13.5 g/dL (ref 13.0–17.0)
MCH: 30 pg (ref 26.0–34.0)
MCHC: 36.7 g/dL — AB (ref 30.0–36.0)
MCV: 81.8 fL (ref 78.0–100.0)
Platelets: 221 10*3/uL (ref 150–400)
RBC: 4.5 MIL/uL (ref 4.22–5.81)
RDW: 14.4 % (ref 11.5–15.5)
WBC: 20.4 10*3/uL — AB (ref 4.0–10.5)

## 2015-04-22 LAB — GLUCOSE, CAPILLARY: GLUCOSE-CAPILLARY: 66 mg/dL (ref 65–99)

## 2015-04-22 LAB — APTT: aPTT: 32 seconds (ref 24–37)

## 2015-04-22 MED ORDER — SODIUM CHLORIDE 0.9 % IV SOLN: Freq: Once | INTRAVENOUS | Status: DC

## 2015-04-22 NOTE — Progress Notes (Signed)
Patient ID: Gary Mcneil, male   DOB: 08/28/1941, 74 y.o.   MRN: [id number]   Pt was scheduled for OP R lung/paratracheal mass bx today  Ate breakfast of eggs; bacon; grits at 8 am today. Unfortunately, miscommunication about this procedure to family.  Will reschedule as soon as can- dependant on pt/family and schedule availability  Discussed with [employer] & PlattMontrel Biederman- dtr in law She has good understanding of plan Agreeable

## 2015-04-22 NOTE — Progress Notes (Signed)
Pt. States he had eggs, grits, sausage, toast at 0800 this am.  [facility] DowningPam Turpin [facility] aware

## 2015-04-24 ENCOUNTER — Emergency Department (HOSPITAL_BASED_OUTPATIENT_CLINIC_OR_DEPARTMENT_OTHER): Payer: Medicare Other

## 2015-04-24 ENCOUNTER — Inpatient Hospital Stay (HOSPITAL_BASED_OUTPATIENT_CLINIC_OR_DEPARTMENT_OTHER)
Admission: EM | Admit: 2015-04-24 | Discharge: 2015-05-07 | DRG: 163 | Disposition: A | Discharge status: Home Health | Payer: Medicare Other | Attending: Internal Medicine | Admitting: Internal Medicine

## 2015-04-24 ENCOUNTER — Encounter (HOSPITAL_BASED_OUTPATIENT_CLINIC_OR_DEPARTMENT_OTHER): Payer: Self-pay | Admitting: Emergency Medicine

## 2015-04-24 DIAGNOSIS — I10 Essential (primary) hypertension: Secondary | ICD-10-CM | POA: Diagnosis present

## 2015-04-24 DIAGNOSIS — R32 Unspecified urinary incontinence: Secondary | ICD-10-CM | POA: Diagnosis not present

## 2015-04-24 DIAGNOSIS — E162 Hypoglycemia, unspecified: Secondary | ICD-10-CM | POA: Diagnosis present

## 2015-04-24 DIAGNOSIS — Z23 Encounter for immunization: Secondary | ICD-10-CM | POA: Diagnosis not present

## 2015-04-24 DIAGNOSIS — M109 Gout, unspecified: Secondary | ICD-10-CM | POA: Diagnosis present

## 2015-04-24 DIAGNOSIS — Z9689 Presence of other specified functional implants: Secondary | ICD-10-CM

## 2015-04-24 DIAGNOSIS — Z79899 Other long term (current) drug therapy: Secondary | ICD-10-CM | POA: Diagnosis not present

## 2015-04-24 DIAGNOSIS — E11649 Type 2 diabetes mellitus with hypoglycemia without coma: Secondary | ICD-10-CM | POA: Diagnosis present

## 2015-04-24 DIAGNOSIS — D649 Anemia, unspecified: Secondary | ICD-10-CM | POA: Diagnosis present

## 2015-04-24 DIAGNOSIS — E43 Unspecified severe protein-calorie malnutrition: Secondary | ICD-10-CM | POA: Diagnosis present

## 2015-04-24 DIAGNOSIS — E1140 Type 2 diabetes mellitus with diabetic neuropathy, unspecified: Secondary | ICD-10-CM | POA: Diagnosis present

## 2015-04-24 DIAGNOSIS — Z66 Do not resuscitate: Secondary | ICD-10-CM | POA: Diagnosis present

## 2015-04-24 DIAGNOSIS — J869 Pyothorax without fistula: Secondary | ICD-10-CM | POA: Diagnosis not present

## 2015-04-24 DIAGNOSIS — T383X5A Adverse effect of insulin and oral hypoglycemic [antidiabetic] drugs, initial encounter: Secondary | ICD-10-CM | POA: Diagnosis present

## 2015-04-24 DIAGNOSIS — Z880 Allergy status to penicillin: Secondary | ICD-10-CM

## 2015-04-24 DIAGNOSIS — J90 Pleural effusion, not elsewhere classified: Secondary | ICD-10-CM | POA: Diagnosis present

## 2015-04-24 DIAGNOSIS — J948 Other specified pleural conditions: Secondary | ICD-10-CM | POA: Diagnosis not present

## 2015-04-24 DIAGNOSIS — K219 Gastro-esophageal reflux disease without esophagitis: Secondary | ICD-10-CM | POA: Diagnosis present

## 2015-04-24 DIAGNOSIS — E785 Hyperlipidemia, unspecified: Secondary | ICD-10-CM | POA: Diagnosis present

## 2015-04-24 DIAGNOSIS — Z6822 Body mass index (BMI) 22.0-22.9, adult: Secondary | ICD-10-CM | POA: Diagnosis not present

## 2015-04-24 DIAGNOSIS — E119 Type 2 diabetes mellitus without complications: Secondary | ICD-10-CM | POA: Diagnosis present

## 2015-04-24 DIAGNOSIS — R4182 Altered mental status, unspecified: Secondary | ICD-10-CM | POA: Diagnosis present

## 2015-04-24 DIAGNOSIS — Z87891 Personal history of nicotine dependence: Secondary | ICD-10-CM

## 2015-04-24 DIAGNOSIS — F0390 Unspecified dementia without behavioral disturbance: Secondary | ICD-10-CM | POA: Diagnosis present

## 2015-04-24 DIAGNOSIS — T50905A Adverse effect of unspecified drugs, medicaments and biological substances, initial encounter: Secondary | ICD-10-CM

## 2015-04-24 DIAGNOSIS — R739 Hyperglycemia, unspecified: Secondary | ICD-10-CM | POA: Diagnosis not present

## 2015-04-24 DIAGNOSIS — R112 Nausea with vomiting, unspecified: Secondary | ICD-10-CM | POA: Diagnosis present

## 2015-04-24 DIAGNOSIS — J189 Pneumonia, unspecified organism: Secondary | ICD-10-CM | POA: Diagnosis present

## 2015-04-24 DIAGNOSIS — R0602 Shortness of breath: Secondary | ICD-10-CM

## 2015-04-24 DIAGNOSIS — R079 Chest pain, unspecified: Secondary | ICD-10-CM

## 2015-04-24 DIAGNOSIS — R918 Other nonspecific abnormal finding of lung field: Secondary | ICD-10-CM | POA: Diagnosis present

## 2015-04-24 DIAGNOSIS — R5383 Other fatigue: Secondary | ICD-10-CM | POA: Diagnosis not present

## 2015-04-24 DIAGNOSIS — F22 Delusional disorders: Secondary | ICD-10-CM

## 2015-04-24 DIAGNOSIS — J939 Pneumothorax, unspecified: Secondary | ICD-10-CM

## 2015-04-24 DIAGNOSIS — J984 Other disorders of lung: Secondary | ICD-10-CM | POA: Diagnosis not present

## 2015-04-24 DIAGNOSIS — Z9889 Other specified postprocedural states: Secondary | ICD-10-CM

## 2015-04-24 DIAGNOSIS — E41 Nutritional marasmus: Secondary | ICD-10-CM | POA: Diagnosis not present

## 2015-04-24 HISTORY — DX: Delusional disorders: F22

## 2015-04-24 LAB — CBC WITH DIFFERENTIAL/PLATELET
BASOS ABS: 0 10*3/uL (ref 0.0–0.1)
Basophils Relative: 0 % (ref 0–1)
EOS PCT: 0 % (ref 0–5)
Eosinophils Absolute: 0.1 10*3/uL (ref 0.0–0.7)
HCT: 29.9 % — ABNORMAL LOW (ref 39.0–52.0)
Hemoglobin: 10.8 g/dL — ABNORMAL LOW (ref 13.0–17.0)
LYMPHS ABS: 0.7 10*3/uL (ref 0.7–4.0)
LYMPHS PCT: 5 % — AB (ref 12–46)
MCH: 29.8 pg (ref 26.0–34.0)
MCHC: 36.1 g/dL — AB (ref 30.0–36.0)
MCV: 82.6 fL (ref 78.0–100.0)
MONOS PCT: 8 % (ref 3–12)
Monocytes Absolute: 1.2 10*3/uL — ABNORMAL HIGH (ref 0.1–1.0)
NEUTROS ABS: 12.6 10*3/uL — AB (ref 1.7–7.7)
Neutrophils Relative %: 87 % — ABNORMAL HIGH (ref 43–77)
PLATELETS: 222 10*3/uL (ref 150–400)
RBC: 3.62 MIL/uL — AB (ref 4.22–5.81)
RDW: 13.9 % (ref 11.5–15.5)
WBC: 14.6 10*3/uL — ABNORMAL HIGH (ref 4.0–10.5)

## 2015-04-24 LAB — URINALYSIS, ROUTINE W REFLEX MICROSCOPIC
Bilirubin Urine: NEGATIVE
Glucose, UA: 100 mg/dL — AB
Hgb urine dipstick: NEGATIVE
Ketones, ur: NEGATIVE mg/dL
Leukocytes, UA: NEGATIVE
NITRITE: NEGATIVE
Protein, ur: 30 mg/dL — AB
Specific Gravity, Urine: 1.015 (ref 1.005–1.030)
UROBILINOGEN UA: 1 mg/dL (ref 0.0–1.0)
pH: 7.5 (ref 5.0–8.0)

## 2015-04-24 LAB — GLUCOSE, CAPILLARY
GLUCOSE-CAPILLARY: 104 mg/dL — AB (ref 65–99)
GLUCOSE-CAPILLARY: 81 mg/dL (ref 65–99)
GLUCOSE-CAPILLARY: 168 mg/dL — AB (ref 65–99)
Glucose-Capillary: 63 mg/dL — ABNORMAL LOW (ref 65–99)
Glucose-Capillary: 83 mg/dL (ref 65–99)
Glucose-Capillary: 94 mg/dL (ref 65–99)
Glucose-Capillary: 84 mg/dL (ref 65–99)
Glucose-Capillary: 99 mg/dL (ref 65–99)
Glucose-Capillary: 104 mg/dL — ABNORMAL HIGH (ref 65–99)
Glucose-Capillary: 109 mg/dL — ABNORMAL HIGH (ref 65–99)
Glucose-Capillary: 190 mg/dL — ABNORMAL HIGH (ref 65–99)

## 2015-04-24 LAB — CBG MONITORING, ED
Glucose-Capillary: 26 mg/dL — CL (ref 65–99)
Glucose-Capillary: 44 mg/dL — CL (ref 65–99)
Glucose-Capillary: 95 mg/dL (ref 65–99)
Glucose-Capillary: 66 mg/dL (ref 65–99)

## 2015-04-24 LAB — BASIC METABOLIC PANEL
ANION GAP: 6 (ref 5–15)
BUN: 17 mg/dL (ref 6–20)
CHLORIDE: 106 mmol/L (ref 101–111)
CO2: 26 mmol/L (ref 22–32)
Calcium: 8.1 mg/dL — ABNORMAL LOW (ref 8.9–10.3)
Creatinine, Ser: 0.66 mg/dL (ref 0.61–1.24)
GFR calc Af Amer: >60 mL/min (ref >60)
GFR calc non Af Amer: >60 mL/min (ref >60)
Glucose, Bld: 88 mg/dL (ref 65–99)
Potassium: 3.4 mmol/L — ABNORMAL LOW (ref 3.5–5.1)
Sodium: 138 mmol/L (ref 135–145)

## 2015-04-24 LAB — URINE MICROSCOPIC-ADD ON

## 2015-04-24 LAB — CBC
HCT: 30.9 % — ABNORMAL LOW (ref 39.0–52.0)
Hemoglobin: 11.4 g/dL — ABNORMAL LOW (ref 13.0–17.0)
MCH: 30 pg (ref 26.0–34.0)
MCHC: 36.9 g/dL — ABNORMAL HIGH (ref 30.0–36.0)
MCV: 81.3 fL (ref 78.0–100.0)
PLATELETS: 220 10*3/uL (ref 150–400)
RBC: 3.8 MIL/uL — ABNORMAL LOW (ref 4.22–5.81)
RDW: 14.4 % (ref 11.5–15.5)
WBC: 14.7 10*3/uL — ABNORMAL HIGH (ref 4.0–10.5)

## 2015-04-24 LAB — MRSA PCR SCREENING: MRSA BY PCR: NEGATIVE

## 2015-04-24 LAB — CREATININE, SERUM
Creatinine, Ser: 0.56 mg/dL — ABNORMAL LOW (ref 0.61–1.24)
GFR calc non Af Amer: >60 mL/min (ref >60)

## 2015-04-24 MED ORDER — MEMANTINE HCL 10 MG PO TABS
10.0000 mg | ORAL_TABLET | Freq: Two times a day (BID) | ORAL | Status: DC
  Administered 2015-04-24 – 2015-05-07 (×25): 10 mg via ORAL
  Filled 2015-04-24 (×29): qty 1

## 2015-04-24 MED ORDER — DEXTROSE 10 % IV SOLN
INTRAVENOUS | Status: DC
  Administered 2015-04-24 (×2): via INTRAVENOUS
  Administered 2015-04-24: 125 mL/h via INTRAVENOUS

## 2015-04-24 MED ORDER — ENSURE ENLIVE PO LIQD
237.0000 mL | Freq: Two times a day (BID) | ORAL | Status: DC
  Administered 2015-04-24 – 2015-04-29 (×9): 237 mL via ORAL

## 2015-04-24 MED ORDER — DEXTROSE 10 % NICU IV FLUID BOLUS
50.0000 mL | INJECTION | Freq: Once | INTRAVENOUS | Status: AC
  Administered 2015-04-24: 50 mL via INTRAVENOUS
  Filled 2015-04-24: qty 50

## 2015-04-24 MED ORDER — ONDANSETRON HCL 4 MG/2ML IJ SOLN
4.0000 mg | Freq: Four times a day (QID) | INTRAMUSCULAR | Status: DC | PRN
  Administered 2015-04-25 – 2015-04-26 (×2): 4 mg via INTRAVENOUS
  Filled 2015-04-24 (×2): qty 2

## 2015-04-24 MED ORDER — TRAMADOL HCL 50 MG PO TABS
50.0000 mg | ORAL_TABLET | Freq: Four times a day (QID) | ORAL | Status: DC | PRN
  Administered 2015-04-25 – 2015-04-28 (×5): 50 mg via ORAL
  Filled 2015-04-24 (×6): qty 1

## 2015-04-24 MED ORDER — DEXTROSE 50 % IV SOLN
25.0000 mL | INTRAVENOUS | Status: DC | PRN
  Administered 2015-04-24: 25 mL via INTRAVENOUS
  Administered 2015-04-24: 14:00:00 via INTRAVENOUS
  Administered 2015-04-24: 25 mL via INTRAVENOUS
  Filled 2015-04-24 (×2): qty 50

## 2015-04-24 MED ORDER — DEXTROSE 50 % IV SOLN
1.0000 | Freq: Once | INTRAVENOUS | Status: AC
  Administered 2015-04-24: 50 mL via INTRAVENOUS

## 2015-04-24 MED ORDER — HYDRALAZINE HCL 20 MG/ML IJ SOLN
10.0000 mg | Freq: Four times a day (QID) | INTRAMUSCULAR | Status: DC | PRN
  Administered 2015-04-25: 10 mg via INTRAVENOUS
  Filled 2015-04-24 (×2): qty 1

## 2015-04-24 MED ORDER — MORPHINE SULFATE 2 MG/ML IJ SOLN
2.0000 mg | Freq: Once | INTRAMUSCULAR | Status: AC
  Administered 2015-04-24: 2 mg via INTRAVENOUS
  Filled 2015-04-24: qty 1

## 2015-04-24 MED ORDER — DEXTROSE 50 % IV SOLN
INTRAVENOUS | Status: AC
  Administered 2015-04-24: 50 mL via INTRAVENOUS
  Filled 2015-04-24: qty 50

## 2015-04-24 MED ORDER — ONDANSETRON HCL 4 MG/2ML IJ SOLN
4.0000 mg | Freq: Once | INTRAMUSCULAR | Status: AC
  Administered 2015-04-24: 4 mg via INTRAVENOUS
  Filled 2015-04-24: qty 2

## 2015-04-24 MED ORDER — ONDANSETRON HCL 4 MG PO TABS
4.0000 mg | ORAL_TABLET | Freq: Four times a day (QID) | ORAL | Status: DC | PRN
  Administered 2015-04-24: 4 mg via ORAL
  Filled 2015-04-24: qty 1

## 2015-04-24 MED ORDER — LEVOFLOXACIN 500 MG PO TABS
500.0000 mg | ORAL_TABLET | Freq: Every day | ORAL | Status: AC
  Administered 2015-04-24 – 2015-04-26 (×3): 500 mg via ORAL
  Filled 2015-04-24 (×3): qty 1

## 2015-04-24 MED ORDER — POTASSIUM CHLORIDE CRYS ER 20 MEQ PO TBCR
40.0000 meq | EXTENDED_RELEASE_TABLET | Freq: Two times a day (BID) | ORAL | Status: DC
  Administered 2015-04-24 – 2015-04-26 (×4): 40 meq via ORAL
  Filled 2015-04-24 (×6): qty 2

## 2015-04-24 MED ORDER — ACETAMINOPHEN 650 MG RE SUPP: 650.0000 mg | Freq: Four times a day (QID) | RECTAL | Status: DC | PRN

## 2015-04-24 MED ORDER — TRAZODONE HCL 50 MG PO TABS
50.0000 mg | ORAL_TABLET | Freq: Every evening | ORAL | Status: DC | PRN
  Administered 2015-04-24 – 2015-05-01 (×6): 50 mg via ORAL
  Filled 2015-04-24 (×9): qty 1

## 2015-04-24 MED ORDER — SODIUM CHLORIDE 0.9 % IJ SOLN
3.0000 mL | Freq: Two times a day (BID) | INTRAMUSCULAR | Status: DC
  Administered 2015-04-24 – 2015-04-26 (×3): 3 mL via INTRAVENOUS

## 2015-04-24 MED ORDER — ALUM & MAG HYDROXIDE-SIMETH 200-200-20 MG/5ML PO SUSP
30.0000 mL | Freq: Four times a day (QID) | ORAL | Status: DC | PRN
  Filled 2015-04-24 (×2): qty 30

## 2015-04-24 MED ORDER — HEPARIN SODIUM (PORCINE) 5000 UNIT/ML IJ SOLN
5000.0000 [IU] | Freq: Three times a day (TID) | INTRAMUSCULAR | Status: DC
  Administered 2015-04-24 (×2): 5000 [IU] via SUBCUTANEOUS
  Filled 2015-04-24 (×6): qty 1

## 2015-04-24 MED ORDER — DONEPEZIL HCL 5 MG PO TABS
5.0000 mg | ORAL_TABLET | Freq: Every day | ORAL | Status: DC
  Administered 2015-04-25 – 2015-05-06 (×12): 5 mg via ORAL
  Filled 2015-04-24 (×15): qty 1

## 2015-04-24 MED ORDER — DEXTROSE 50 % IV SOLN
INTRAVENOUS | Status: AC
  Filled 2015-04-24: qty 50

## 2015-04-24 MED ORDER — ACETAMINOPHEN 325 MG PO TABS
650.0000 mg | ORAL_TABLET | Freq: Four times a day (QID) | ORAL | Status: DC | PRN
  Administered 2015-04-24 – 2015-04-29 (×10): 650 mg via ORAL
  Filled 2015-04-24 (×10): qty 2

## 2015-04-24 NOTE — ED Notes (Signed)
I took CBG and got 95 mg. / dcltr.

## 2015-04-24 NOTE — Consult Note (Signed)
Reason for consult: Right paratracheal lung mass biopsy  Referring Physician(s): TRH  History of Present Illness: Gary Mcneil is a 74 y.o. male prior smoker with history of hypertension, diabetes, mild dementia, admitted recently with right-sided chest pain. CT chest was negative for PE however did reveal a large right paratracheal lung mass and loculated pleural effusion. Request now received for CT guided biopsy of the right paratracheal lung mass.  Past Medical History  Diagnosis Date  . Type II or unspecified type diabetes mellitus without mention of complication, uncontrolled   . DEMENTIA   . Neuromuscular disorder   . Hypertension   . Gout   . Arthritis   . Dementia 04/24/2012  . Unspecified hereditary and idiopathic peripheral neuropathy   . Anemia, unspecified   . Hypotension, unspecified   . Acute kidney failure, unspecified   . Unspecified vitamin D deficiency   . Other and unspecified hyperlipidemia   . Hypopotassemia   . Disorder of bone and cartilage, unspecified   . Memory loss   . Hypotension, unspecified   . Acute kidney failure, unspecified   . Muscle weakness (generalized)   . Dizziness and giddiness   . Myalgia and myositis, unspecified   . Screening for ischemic heart disease   . Type II or unspecified type diabetes mellitus without mention of complication, uncontrolled   . Gout, unspecified   . Tobacco use disorder   . Memory loss   . Special screening for malignant neoplasm of prostate   . GERD (gastroesophageal reflux disease)     Past Surgical History  Procedure Laterality Date  . Inguinal hernia repair      Allergies: Penicillins  Medications: Prior to Admission medications   Medication Sig Start Date End Date Taking? Authorizing Provider  allopurinol (ZYLOPRIM) 100 MG tablet Take 2 tablets (200 mg total) by mouth daily. 02/25/15   [name], NP  donepezil (ARICEPT) 10 MG tablet Start 5 mg (1/2 tablet) daily at bedtime for 1  month then increase to 10 mg (1 tablet) daily at bedtime for memory Patient taking differently: Take 5 mg by mouth at bedtime.  02/25/15   [name], NP  hydrochlorothiazide (HYDRODIURIL) 25 MG tablet TAKE 1 TABLET BY MOUTH DAILY 03/08/15   [name], NP  levofloxacin (LEVAQUIN) 500 MG tablet Take 1 tablet (500 mg total) by mouth daily. 04/21/15   Costin Otelia Sergeant, MD  memantine (NAMENDA) 10 MG tablet TAKE 1 TABLET BY MOUTH TWICE DAILY 03/15/15   [name], NP  Multiple Vitamin (MULTIVITAMIN WITH MINERALS) TABS tablet Take 1 tablet by mouth daily.    Historical Provider, MD  NAMENDA XR 28 MG CP24 TAKE ONE CAPSULE BY MOUTH DAILY 11/09/14   Oneal Grout, MD  potassium chloride SA (K-DUR,KLOR-CON) 20 MEQ tablet TAKE 2 TABLETS BY MOUTH TWICE DAILY 01/04/15   [name], NP  traMADol (ULTRAM) 50 MG tablet TAKE 1-2 TABLETS BY MOUTH EVERY 8 HOURS AS NEEDED Patient taking differently: TAKE 1-2 TABLETS BY MOUTH EVERY 8 HOURS AS NEEDED FOR PAIN. 03/29/15   Kermit Balo, DO     Family History  Problem Relation Age of Onset  . Diabetes Mother   . Hypertension Mother   . Diabetes Father   . Hypertension Father   . Kidney disease Father   . Diabetes Sister   . Kidney disease Brother     History   Social History  . Marital Status: Single    Spouse Name:  N/A  . Number of Children: 3  . Years of Education: N/A   Occupational History  . Retired Charity fundraiserchemist from [employer]   . Retired Education officer, environmentalpastor    Social History Main Topics  . Smoking status: Former Smoker    Quit date: 11/20/1969  . Smokeless tobacco: Never Used  . Alcohol Use: No  . Drug Use: No  . Sexual Activity: No   Other Topics Concern  . None   Social History Narrative   Bachelors in Nurse, adultChemistry   Masters in [employer]   Attended [employer]               Review of Systems  Constitutional: Positive for fatigue. Negative for fever and chills.  Respiratory: Positive for cough.        Occ  dyspnea with exertion  Cardiovascular: Positive for chest pain.  Gastrointestinal: Negative for nausea, vomiting, abdominal pain and blood in stool.  Genitourinary: Negative for dysuria and hematuria.  Musculoskeletal: Negative for back pain.  Neurological: Negative for headaches.    Vital Signs: BP 153/78 mmHg  Pulse 77  Temp(Src) 98.3 F (36.8 C) (Oral)  Resp 21  SpO2 98%  Physical Exam  Constitutional: He is oriented to person, place, and time.  Thin BM in NAD  Cardiovascular: Normal rate and regular rhythm.   Pulmonary/Chest: Effort normal.  BS dim on rt, left clear  Abdominal: Soft. Bowel sounds are normal. There is no tenderness.  Musculoskeletal: Normal range of motion. He exhibits no edema.  Neurological: He is alert and oriented to person, place, and time.    Mallampati Score:     Imaging: Dg Chest 2 View  04/24/2015   CLINICAL DATA:  Hypoglycemia.  EXAM: CHEST  2 VIEW  COMPARISON:  Radiographs and CT 04/20/2015  FINDINGS: Masslike opacity in the periphery of the right lung, better defined on the current exam. Right pleural effusion persists. Left lung remains clear. There is no pneumothorax. The heart size is normal. No pulmonary edema.  IMPRESSION: Masslike opacity in the right lung, better defined on current exam. Right pleural effusion persists. No new abnormality.   Electronically Signed   By: Rubye OaksMelanie  Ehinger M.D.   On: 04/24/2015 05:14   Ct Angio Chest Pe W/cm &/or Wo Cm  04/20/2015   CLINICAL DATA:  74 year old male with chest pain and cough  EXAM: CT ANGIOGRAPHY CHEST WITH CONTRAST  TECHNIQUE: Multidetector CT imaging of the chest was performed using the standard protocol during bolus administration of intravenous contrast. Multiplanar CT image reconstructions and MIPs were obtained to evaluate the vascular anatomy.  CONTRAST:  80mL OMNIPAQUE IOHEXOL 350 MG/ML SOLN  COMPARISON:  Chest x-ray obtained earlier today ; prior chest x-ray 12/30/2014; prior chest CT  10/16/2005  FINDINGS: Mediastinum: Low right paratracheal lymph node measures 1.4 cm in short axis. Masslike consolidation centered in the posteromedial aspect of the right upper lobe partially encircles the trachea and abuts the esophagus within the middle mediastinum. A faint tissue plane remains present between the esophagus and the soft tissue mass. The esophagus itself is otherwise unremarkable. No anterior mediastinal mass. Thoracic inlet and thyroid gland are unremarkable.  Heart/Vascular: Adequate opacification of the pulmonary arteries to the proximal subsegmental level. No evidence of central filling defect to suggest acute pulmonary embolus. The main pulmonary artery is enlarged measuring approximately 4.2 cm in diameter. This suggests underlying pulmonary arterial hypertension. Atherosclerotic calcifications are present along the course of the right and left anterior descending coronary arteries. The heart  is within normal limits for size. No pericardial effusion. Normal caliber 3 vessel aorta.  Lungs/Pleura: Somewhat amorphous elongated region of masslike consolidation in the posteromedial aspect of the right upper lobe extending from the apex down along the trachea to the right hilum. This masslike consolidation appears to extend into the adjacent mediastinal fat. The mass is largest in craniocaudal dimension were in measures 9.2 cm. On the axial images the mass measures up to 5.6 x 4.4 cm. There is a partially loculated pleural effusion with a loculated fluid extending into the minor fissure. Associated right lower lobe atelectasis with volume loss and slight right-to-left shift of the mediastinal structures. Small volume superimposed consolidation in the inferior right lower lobe. Background of paraseptal emphysema. The left lung is clear.  Bones/Soft Tissues: No evidence of acute fracture. Nonspecific 1 cm (best measured on coronal image 91 of series 7) sclerotic focus in the posterior aspect of the  T8 vertebral body just left of midline.  Upper Abdomen: Incompletely imaged water attenuation lesion in the upper pole of the left kidney likely represents a simple cyst. Otherwise, the visualized upper abdomen is unremarkable. The new  Review of the MIP images confirms the above findings.  IMPRESSION: 1. Negative for pulmonary embolism. 2. Masslike consolidation extending from the right lung apex inferiorly along the mediastinum to the hilum. This region measures up to 9.2 cm in maximal dimension and is concerning for primary bronchogenic carcinoma. A masslike region of pneumonia, or other infectious/inflammatory process is possible but considered less likely. Recommend further evaluation with bronchoscopy. If the bronchoscopic and clinical findings are also consistent with malignancy, staging PET-CT may be warranted. 3. Nonspecific 1 cm sclerotic focus in the T8 vertebral body concerning for possible osseous metastatic disease. 4. Mildly enlarged low right paratracheal lymph node concerning for metastatic disease versus reactive adenopathy. 5. Small to moderate loculated right pleural effusion is presumed malignant or parapneumonic. 6. Right lower lobe atelectasis with a small amount of superimposed consolidation which may represent pneumonia or pneumonitis. 7. Atherosclerotic calcification along the course of the coronary arteries. Please note that although the presence of coronary artery calcium documents the presence of coronary artery disease, the severity of this disease and any potential stenosis cannot be assessed on this non-gated CT examination. Assessment for potential risk factor modification, dietary therapy or pharmacologic therapy may be warranted, if clinically indicated. 8. Enlarged main pulmonary artery as can be seen in the setting of pulmonary arterial hypertension.   Electronically Signed   By: Malachy Moan M.D.   On: 04/20/2015 08:07   US Abdomen Limited  04/20/2015   CLINICAL DATA:   Abdominal pain.  Chest pain since 3 a.m.  EXAM: US ABDOMEN LIMITED - RIGHT UPPER QUADRANT  COMPARISON:  Chest CT 04/20/2015  FINDINGS: Gallbladder:  Multiple echogenic foci in the gallbladder are compatible with stones and sludge. Gallbladder wall is not thickened, measuring up to 2 mm. The patient does not have a sonographic Murphy's sign.  Common bile duct:  Diameter: 3 mm  Liver:  No focal lesion identified. Within normal limits in parenchymal echogenicity. Main portal vein is patent.  IMPRESSION: Cholelithiasis.  No biliary dilatation.   Electronically Signed   By: Richarda Overlie M.D.   On: 04/20/2015 09:26   Dg Chest Port 1 View  04/20/2015   CLINICAL DATA:  Right lower chest pain, shortness of breath and weakness.  EXAM: PORTABLE CHEST - 1 VIEW  COMPARISON:  12/30/2014  FINDINGS: Lung volumes are low. There  is right pleural effusion and ill-defined right basilar opacity. The heart size and mediastinal contours are unchanged allowing for differences in technique. Increased bronchovascular markings likely related to low lung volumes. No pneumothorax.  IMPRESSION: Right pleural effusion with ill-defined right basilar opacity, suspect pneumonia with parapneumonic effusion. Pleural effusion causing right basilar atelectasis is alternatively considered.   Electronically Signed   By: Rubye Oaks M.D.   On: 04/20/2015 06:07    Labs:  CBC:  Recent Labs  04/21/15 0409 04/22/15 0900 04/24/15 0340 04/24/15 0810  WBC 12.6* 20.4* 14.6* 14.7*  HGB 12.2* 13.5 10.8* 11.4*  HCT 33.6* 36.8* 29.9* 30.9*  PLT 185 221 222 220    COAGS:  Recent Labs  04/21/15 0409 04/22/15 0900  INR 1.40 1.37  APTT 40* 32    BMP:  Recent Labs  02/15/15 1628 04/20/15 0524 04/21/15 0409 04/24/15 0340  NA 143 135 137 138  K 3.6 3.4* 3.1* 3.4*  CL 100 97* 102 106  CO2 26 22 23 26   GLUCOSE 117* 114* 93 88  BUN 13 11 16 17   CALCIUM 10.1 9.3 8.3* 8.1*  CREATININE 0.91 0.94 0.94 0.66  GFRNONAA 84 >60 >60 >60   GFRAA 98 >60 >60 >60    LIVER FUNCTION TESTS:  Recent Labs  08/12/14 1050 02/15/15 1628 04/20/15 0604 04/21/15 0409  BILITOT 0.4 0.5 2.0* 2.6*  AST 44* 25 67* 40  ALT 51* 25 81* 50  ALKPHOS 83 65 110 101  PROT 7.0 6.8 7.3 6.1*  ALBUMIN  --   --  3.4* 2.6*    TUMOR MARKERS: No results for input(s): AFPTM, CEA, CA199, CHROMGRNA in the last 8760 hours.  Assessment and Plan: Gary Mcneil is a 74 y.o. male prior smoker with history of hypertension, diabetes, mild dementia, admitted recently with right-sided chest pain. CT chest was negative for PE however did reveal a large right paratracheal lung mass and loculated pleural effusion. Request now received for CT guided biopsy of the right paratracheal lung mass. Imaging studies have been reviewed. Risks and benefits discussed with the patient/patient's son including, but not limited to bleeding, infection, damage to adjacent structures or low yield requiring additional tests and death. All of the patient's questions were answered, patient is agreeable to proceed. Consent signed and in chart. Procedure tent scheduled for 6/6. If patient is discharged prior to 6/6 please contact radiology at [phone] for appropriate rescheduling of pt.     Thank you for this interesting consult.  I greatly enjoyed meeting CARDEN TEEL and look forward to participating in their care.  Signed: D. Jeananne Rama 04/24/2015, 9:55 AM   I spent a total of 20 minutes in face to face in clinical consultation, greater than 50% of which was counseling/coordinating care for CT-guided right paratracheal lung mass biopsy

## 2015-04-24 NOTE — ED Provider Notes (Addendum)
CSN: [id number]     Arrival date & time 04/24/15  0303 History   None    Chief Complaint  Patient presents with  . Hypoglycemia     (Consider location/radiation/quality/duration/timing/severity/associated sxs/prior Treatment) Patient is a 74 y.o. male presenting with hypoglycemia. The history is provided by the EMS personnel. The history is limited by the condition of the patient (level 5 caveat dementia).  Hypoglycemia Initial blood sugar:  13 Blood sugar after intervention:  24 Severity:  Severe Onset quality:  Sudden Timing:  Constant Progression:  Unchanged Chronicity:  New Diabetic status:  Controlled with oral medications Context: recent illness   Context: not decreased oral intake   Relieved by:  Nothing Ineffective treatments:  None tried Associated symptoms: no seizures   Risk factors: no alcohol abuse and no drug use     Past Medical History  Diagnosis Date  . Type II or unspecified type diabetes mellitus without mention of complication, uncontrolled   . DEMENTIA   . Neuromuscular disorder   . Hypertension   . Gout   . Arthritis   . Dementia 04/24/2012  . Unspecified hereditary and idiopathic peripheral neuropathy   . Anemia, unspecified   . Hypotension, unspecified   . Acute kidney failure, unspecified   . Unspecified vitamin D deficiency   . Other and unspecified hyperlipidemia   . Hypopotassemia   . Disorder of bone and cartilage, unspecified   . Memory loss   . Hypotension, unspecified   . Acute kidney failure, unspecified   . Muscle weakness (generalized)   . Dizziness and giddiness   . Myalgia and myositis, unspecified   . Screening for ischemic heart disease   . Type II or unspecified type diabetes mellitus without mention of complication, uncontrolled   . Gout, unspecified   . Tobacco use disorder   . Memory loss   . Special screening for malignant neoplasm of prostate   . GERD (gastroesophageal reflux disease)    Past Surgical History   Procedure Laterality Date  . Inguinal hernia repair     Family History  Problem Relation Age of Onset  . Diabetes Mother   . Hypertension Mother   . Diabetes Father   . Hypertension Father   . Kidney disease Father   . Diabetes Sister   . Kidney disease Brother    History  Substance Use Topics  . Smoking status: Former Smoker    Quit date: 11/20/1969  . Smokeless tobacco: Never Used  . Alcohol Use: No    Review of Systems  Unable to perform ROS Neurological: Negative for seizures.      Allergies  Penicillins  Home Medications   Prior to Admission medications   Medication Sig Start Date End Date Taking? Authorizing Provider  allopurinol (ZYLOPRIM) 100 MG tablet Take 2 tablets (200 mg total) by mouth daily. 02/25/15   [name], NP  donepezil (ARICEPT) 10 MG tablet Start 5 mg (1/2 tablet) daily at bedtime for 1 month then increase to 10 mg (1 tablet) daily at bedtime for memory Patient taking differently: Take 5 mg by mouth at bedtime.  02/25/15   [name], NP  gabapentin (NEURONTIN) 300 MG capsule Take 1 capsule (300 mg total) by mouth 3 (three) times daily. 09/02/14   Mahima Pandey, MD  glipiZIDE (GLUCOTROL XL) 5 MG 24 hr tablet TAKE 1 TABLET BY MOUTH DAILY TO CONTROL DIABETES 03/15/15   [name], NP  hydrochlorothiazide (HYDRODIURIL) 25 MG tablet TAKE 1 TABLET BY  MOUTH DAILY 03/08/15   [name], NP  levofloxacin (LEVAQUIN) 500 MG tablet Take 1 tablet (500 mg total) by mouth daily. 04/21/15   Costin Otelia Sergeant, MD  memantine (NAMENDA) 10 MG tablet TAKE 1 TABLET BY MOUTH TWICE DAILY 03/15/15   [name], NP  metFORMIN (GLUCOPHAGE) 500 MG tablet TAKE 1 TABLET BY MOUTH DAILY 10/26/14   [name], NP  Multiple Vitamin (MULTIVITAMIN WITH MINERALS) TABS tablet Take 1 tablet by mouth daily.    Historical Provider, MD  NAMENDA XR 28 MG CP24 TAKE ONE CAPSULE BY MOUTH DAILY 11/09/14   Oneal Grout, MD  potassium chloride SA  (K-DUR,KLOR-CON) 20 MEQ tablet TAKE 2 TABLETS BY MOUTH TWICE DAILY 01/04/15   [name], NP  traMADol (ULTRAM) 50 MG tablet TAKE 1-2 TABLETS BY MOUTH EVERY 8 HOURS AS NEEDED Patient taking differently: TAKE 1-2 TABLETS BY MOUTH EVERY 8 HOURS AS NEEDED FOR PAIN. 03/29/15   Tiffany L Reed, DO   BP 157/80 mmHg  Pulse 77  Temp(Src) 97.4 F (36.3 C) (Oral)  Resp 20  SpO2 97% Physical Exam  Constitutional: He appears well-developed and well-nourished.  HENT:  Head: Normocephalic and atraumatic.  Mouth/Throat: Oropharynx is clear and moist.  Eyes: Conjunctivae are normal. Pupils are equal, round, and reactive to light.  Neck: Normal range of motion. Neck supple.  Cardiovascular: Normal rate, regular rhythm and intact distal pulses.   Pulmonary/Chest: Effort normal and breath sounds normal. No respiratory distress. He has no wheezes. He has no rales.  Abdominal: Soft. Bowel sounds are normal. There is no tenderness. There is no rebound and no guarding.  Musculoskeletal: Normal range of motion.  Neurological: He is alert. He has normal reflexes.  Skin: Skin is warm and dry. He is not diaphoretic.  Psychiatric: He has a normal mood and affect.    ED Course  Procedures (including critical care time) Labs Review Labs Reviewed  BASIC METABOLIC PANEL - Abnormal; Notable for the following:    Potassium 3.4 (*)    Calcium 8.1 (*)    All other components within normal limits  URINALYSIS, ROUTINE W REFLEX MICROSCOPIC (NOT AT [facility]) - Abnormal; Notable for the following:    Glucose, UA 100 (*)    Protein, ur 30 (*)    All other components within normal limits  CBG MONITORING, ED - Abnormal; Notable for the following:    Glucose-Capillary 28 (*)    All other components within normal limits  CBG MONITORING, ED - Abnormal; Notable for the following:    Glucose-Capillary 26 (*)    All other components within normal limits  CBG MONITORING, ED - Abnormal; Notable for the following:     Glucose-Capillary 44 (*)    All other components within normal limits  URINE MICROSCOPIC-ADD ON  CBC WITH DIFFERENTIAL/PLATELET    Imaging Review No results found.   EKG Interpretation None      MDM   Final diagnoses:  Adverse drug reaction, initial encounter    2 D50 by me with a D50 enroute with Q20 minute CBG Medications  dextrose 10 % infusion ( Intravenous New Bag/Given 04/24/15 0427)  dextrose 50 % solution 50 mL (50 mLs Intravenous Given 04/24/15 0325)  dextrose 10% NICU IV fluid bolus 50 mL (50 mLs Intravenous Given 04/24/15 0347)  dextrose 50 % solution 50 mL (50 mLs Intravenous Given 04/24/15 0425)   Results for orders placed or performed during the hospital encounter of 04/24/15  CBC with Differential/Platelet  Result Value Ref Range   WBC 14.6 (H) 4.0 - 10.5 K/uL   RBC 3.62 (L) 4.22 - 5.81 MIL/uL   Hemoglobin 10.8 (L) 13.0 - 17.0 g/dL   HCT 16.1 (L) 09.6 - 04.5 %   MCV 82.6 78.0 - 100.0 fL   MCH 29.8 26.0 - 34.0 pg   MCHC 36.1 (H) 30.0 - 36.0 g/dL   RDW 40.9 81.1 - 91.4 %   Platelets 222 150 - 400 K/uL   Neutrophils Relative % 87 (H) 43 - 77 %   Neutro Abs 12.6 (H) 1.7 - 7.7 K/uL   Lymphocytes Relative 5 (L) 12 - 46 %   Lymphs Abs 0.7 0.7 - 4.0 K/uL   Monocytes Relative 8 3 - 12 %   Monocytes Absolute 1.2 (H) 0.1 - 1.0 K/uL   Eosinophils Relative 0 0 - 5 %   Eosinophils Absolute 0.1 0.0 - 0.7 K/uL   Basophils Relative 0 0 - 1 %   Basophils Absolute 0.0 0.0 - 0.1 K/uL   WBC Morphology WHITE COUNT CONFIRMED ON SMEAR    RBC Morphology TARGET CELLS    Smear Review PLATELET COUNT CONFIRMED BY SMEAR   Basic metabolic panel  Result Value Ref Range   Sodium 138 135 - 145 mmol/L   Potassium 3.4 (L) 3.5 - 5.1 mmol/L   Chloride 106 101 - 111 mmol/L   CO2 26 22 - 32 mmol/L   Glucose, Bld 88 65 - 99 mg/dL   BUN 17 6 - 20 mg/dL   Creatinine, Ser 7.82 0.61 - 1.24 mg/dL   Calcium 8.1 (L) 8.9 - 10.3 mg/dL   GFR calc non Af Amer >60 >60 mL/min   GFR calc Af Amer >60  >60 mL/min   Anion gap 6 5 - 15  Urinalysis, Routine w reflex microscopic (not at [facility])  Result Value Ref Range   Color, Urine YELLOW YELLOW   APPearance CLEAR CLEAR   Specific Gravity, Urine 1.015 1.005 - 1.030   pH 7.5 5.0 - 8.0   Glucose, UA 100 (A) NEGATIVE mg/dL   Hgb urine dipstick NEGATIVE NEGATIVE   Bilirubin Urine NEGATIVE NEGATIVE   Ketones, ur NEGATIVE NEGATIVE mg/dL   Protein, ur 30 (A) NEGATIVE mg/dL   Urobilinogen, UA 1.0 0.0 - 1.0 mg/dL   Nitrite NEGATIVE NEGATIVE   Leukocytes, UA NEGATIVE NEGATIVE  Urine microscopic-add on  Result Value Ref Range   Squamous Epithelial / LPF RARE RARE   WBC, UA 0-2 <3 WBC/hpf   RBC / HPF 0-2 <3 RBC/hpf   Bacteria, UA RARE RARE  POC CBG, ED  Result Value Ref Range   Glucose-Capillary 28 (LL) 65 - 99 mg/dL   Comment 1 Notify RN   CBG monitoring, ED  Result Value Ref Range   Glucose-Capillary 26 (LL) 65 - 99 mg/dL   Comment 1 Call MD NNP PA CNM   POC CBG, ED  Result Value Ref Range   Glucose-Capillary 44 (LL) 65 - 99 mg/dL   Ct Angio Chest Pe W/cm &/or Wo Cm  04/20/2015   CLINICAL DATA:  74 year old male with chest pain and cough  EXAM: CT ANGIOGRAPHY CHEST WITH CONTRAST  TECHNIQUE: Multidetector CT imaging of the chest was performed using the standard protocol during bolus administration of intravenous contrast. Multiplanar CT image reconstructions and MIPs were obtained to evaluate the vascular anatomy.  CONTRAST:  80mL OMNIPAQUE IOHEXOL 350 MG/ML SOLN  COMPARISON:  Chest x-ray obtained earlier today ; prior chest x-ray 12/30/2014;  prior chest CT 10/16/2005  FINDINGS: Mediastinum: Low right paratracheal lymph node measures 1.4 cm in short axis. Masslike consolidation centered in the posteromedial aspect of the right upper lobe partially encircles the trachea and abuts the esophagus within the middle mediastinum. A faint tissue plane remains present between the esophagus and the soft tissue mass. The esophagus itself is otherwise  unremarkable. No anterior mediastinal mass. Thoracic inlet and thyroid gland are unremarkable.  Heart/Vascular: Adequate opacification of the pulmonary arteries to the proximal subsegmental level. No evidence of central filling defect to suggest acute pulmonary embolus. The main pulmonary artery is enlarged measuring approximately 4.2 cm in diameter. This suggests underlying pulmonary arterial hypertension. Atherosclerotic calcifications are present along the course of the right and left anterior descending coronary arteries. The heart is within normal limits for size. No pericardial effusion. Normal caliber 3 vessel aorta.  Lungs/Pleura: Somewhat amorphous elongated region of masslike consolidation in the posteromedial aspect of the right upper lobe extending from the apex down along the trachea to the right hilum. This masslike consolidation appears to extend into the adjacent mediastinal fat. The mass is largest in craniocaudal dimension were in measures 9.2 cm. On the axial images the mass measures up to 5.6 x 4.4 cm. There is a partially loculated pleural effusion with a loculated fluid extending into the minor fissure. Associated right lower lobe atelectasis with volume loss and slight right-to-left shift of the mediastinal structures. Small volume superimposed consolidation in the inferior right lower lobe. Background of paraseptal emphysema. The left lung is clear.  Bones/Soft Tissues: No evidence of acute fracture. Nonspecific 1 cm (best measured on coronal image 91 of series 7) sclerotic focus in the posterior aspect of the T8 vertebral body just left of midline.  Upper Abdomen: Incompletely imaged water attenuation lesion in the upper pole of the left kidney likely represents a simple cyst. Otherwise, the visualized upper abdomen is unremarkable. The new  Review of the MIP images confirms the above findings.  IMPRESSION: 1. Negative for pulmonary embolism. 2. Masslike consolidation extending from the  right lung apex inferiorly along the mediastinum to the hilum. This region measures up to 9.2 cm in maximal dimension and is concerning for primary bronchogenic carcinoma. A masslike region of pneumonia, or other infectious/inflammatory process is possible but considered less likely. Recommend further evaluation with bronchoscopy. If the bronchoscopic and clinical findings are also consistent with malignancy, staging PET-CT may be warranted. 3. Nonspecific 1 cm sclerotic focus in the T8 vertebral body concerning for possible osseous metastatic disease. 4. Mildly enlarged low right paratracheal lymph node concerning for metastatic disease versus reactive adenopathy. 5. Small to moderate loculated right pleural effusion is presumed malignant or parapneumonic. 6. Right lower lobe atelectasis with a small amount of superimposed consolidation which may represent pneumonia or pneumonitis. 7. Atherosclerotic calcification along the course of the coronary arteries. Please note that although the presence of coronary artery calcium documents the presence of coronary artery disease, the severity of this disease and any potential stenosis cannot be assessed on this non-gated CT examination. Assessment for potential risk factor modification, dietary therapy or pharmacologic therapy may be warranted, if clinically indicated. 8. Enlarged main pulmonary artery as can be seen in the setting of pulmonary arterial hypertension.   Electronically Signed   By: Malachy Moan M.D.   On: 04/20/2015 08:07   US Abdomen Limited  04/20/2015   CLINICAL DATA:  Abdominal pain.  Chest pain since 3 a.m.  EXAM: US ABDOMEN LIMITED - RIGHT  UPPER QUADRANT  COMPARISON:  Chest CT 04/20/2015  FINDINGS: Gallbladder:  Multiple echogenic foci in the gallbladder are compatible with stones and sludge. Gallbladder wall is not thickened, measuring up to 2 mm. The patient does not have a sonographic Murphy's sign.  Common bile duct:  Diameter: 3 mm  Liver:   No focal lesion identified. Within normal limits in parenchymal echogenicity. Main portal vein is patent.  IMPRESSION: Cholelithiasis.  No biliary dilatation.   Electronically Signed   By: Richarda OverlieAdam  Henn M.D.   On: 04/20/2015 09:26   Dg Chest Port 1 View  04/20/2015   CLINICAL DATA:  Right lower chest pain, shortness of breath and weakness.  EXAM: PORTABLE CHEST - 1 VIEW  COMPARISON:  12/30/2014  FINDINGS: Lung volumes are low. There is right pleural effusion and ill-defined right basilar opacity. The heart size and mediastinal contours are unchanged allowing for differences in technique. Increased bronchovascular markings likely related to low lung volumes. No pneumothorax.  IMPRESSION: Right pleural effusion with ill-defined right basilar opacity, suspect pneumonia with parapneumonic effusion. Pleural effusion causing right basilar atelectasis is alternatively considered.   Electronically Signed   By: Rubye OaksMelanie  Ehinger M.D.   On: 04/20/2015 06:07    MDM Reviewed: previous chart, nursing note and vitals Reviewed previous: labs and x-ray Interpretation: labs and x-ray (28 and 26 and 44 cbg) Total time providing critical care: > 105 minutes (120 [facility] and repeat checks and D 10 adjustments by me). This excludes time spent performing separately reportable procedures and services. Consults: admitting MD    CRITICAL CARE Performed by: Jasmine AwePALUMBO-RASCH,Emilija Bohman K Total critical care time: 120 minutes Critical care time was exclusive of separately billable procedures and treating other patients. Critical care was necessary to treat or prevent imminent or life-threatening deterioration. Critical care was time spent personally by me on the following activities: development of treatment plan with patient and/or surrogate as well as nursing, discussions with consultants, evaluation of patient's response to treatment, examination of patient, obtaining history from patient or surrogate,  ordering and performing treatments and interventions, ordering and review of laboratory studies, ordering and review of radiographic studies, pulse oximetry and re-evaluation of patient's condition.  D10 now at 125 cc/hr still low now increased to 200 cc/hr  [facility]  Talisa Petrak, MD 04/24/15 0545  Arlo Butt, MD 04/24/15 [phone]

## 2015-04-24 NOTE — H&P (Signed)
[facility] History and Physical                                                                                    Gary Mcneil, is a 74 y.o. male  MRN: [id number]   DOB - 12-02-1940  Admit Date - 04/24/2015  Outpatient Primary MD for the patient is [name], NP  Referring Physician:  Dr. Nicanor Alcon, EDP  Chief Complaint:   Chief Complaint  Patient presents with  . Hypoglycemia     HPI  Koki Buxton  is a 74 y.o. male, with a PMH of diabetes, mild dementia, HTN, HLD who was diagnosed on 5/31 with a right lung mass.  He was found to have a CBG of 13 on 6/3.  Per his son, Xion, the patient was recently taken off of metformin and glipizide.  The pharmacy refilled his medications and included glipizide and metformin as well. The son placed these medications in the patient's med box, and consequently the patient took them. The son reports that as the patient has developed dementia he frequently takes extra pills. The son noticed on the evening of 6/2 that the patient was lethargic. He took the patient to his home fed him and kept him there to check CBGs for 24 hours. On the evening of 6/3, the patient was taken back to his home fed and put to bed. His daughter-in-law checked on him later that evening and the patient could not wake up to answer the door. EMS found his blood sugar to be 13, and the patient was brought in for treatment.  Reportedly Mr. Natter last took glipizide and metformin on the evening of 6/2, yet this morning (6/4) after multiple doses of D50 and a continuous D10 infusion, the patient's CBGs continue to drop. He has been admitted to stepdown.  In the ER: Chest x-ray shows a large mass in the right lung and right pleural effusion.  Potassium is 3.4, white count is 20.4. Hemoglobin has dropped from 13.5-10.8 with dilution.  Review of Systems   In addition to the HPI above, he complains of pain in his right side No Fever-chills, No Headache, No changes with Vision  or hearing, No problems swallowing food or Liquids, per his son he is occasionally skipping meals. He has lost 20 pounds in the last year unintentionally. No Chest pain, or Shortness of Breath, he has a nonproductive cough No Abdominal pain, No Nausea or Vomiting, Bowel movements are regular, No Blood in stool or Urine, No dysuria, No new skin rashes or bruises, No new joints pains-aches,  No new weakness, tingling, numbness in any extremity, No recent weight gain or loss, A full 10 point Review of Systems was done, except as stated above, all other Review of Systems were negative.  Past Medical History  Past Medical History  Diagnosis Date  . Type II or unspecified type diabetes mellitus without mention of complication, uncontrolled   . DEMENTIA   . Neuromuscular disorder   . Hypertension   . Gout   . Arthritis   . Dementia 04/24/2012  . Unspecified hereditary and idiopathic peripheral neuropathy   . Anemia, unspecified   .  Hypotension, unspecified   . Acute kidney failure, unspecified   . Unspecified vitamin D deficiency   . Other and unspecified hyperlipidemia   . Hypopotassemia   . Disorder of bone and cartilage, unspecified   . Memory loss   . Hypotension, unspecified   . Acute kidney failure, unspecified   . Muscle weakness (generalized)   . Dizziness and giddiness   . Myalgia and myositis, unspecified   . Screening for ischemic heart disease   . Type II or unspecified type diabetes mellitus without mention of complication, uncontrolled   . Gout, unspecified   . Tobacco use disorder   . Memory loss   . Special screening for malignant neoplasm of prostate   . GERD (gastroesophageal reflux disease)     Past Surgical History  Procedure Laterality Date  . Inguinal hernia repair        Social History History  Substance Use Topics  . Smoking status: Former Smoker    Quit date: 11/20/1969  . Smokeless tobacco: Never Used  . 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  He lives alone. Is independent with his ADLs. Family lives close by. He has a history of marijuana use. The patient reports that he smoked cigarettes for about 25 years.  Family History Family History  Problem Relation Age of Onset  . Diabetes Mother   . Hypertension Mother   . Diabetes Father   . Hypertension Father   . Kidney disease Father   . Diabetes Sister   . Kidney disease Brother    lung cancer in his father, and grandfather.  Prior to Admission medications   Medication Sig Start Date End Date Taking? Authorizing Provider  allopurinol (ZYLOPRIM) 100 MG tablet Take 2 tablets (200 mg total) by mouth daily. 02/25/15   [name], NP  donepezil (ARICEPT) 10 MG tablet Start 5 mg (1/2 tablet) daily at bedtime for 1 month then increase to 10 mg (1 tablet) daily at bedtime for memory Patient taking differently: Take 5 mg by mouth at bedtime.  02/25/15   [name], NP  hydrochlorothiazide (HYDRODIURIL) 25 MG tablet TAKE 1 TABLET BY MOUTH DAILY 03/08/15   [name], NP  levofloxacin (LEVAQUIN) 500 MG tablet Take 1 tablet (500 mg total) by mouth daily. 04/21/15   Costin Otelia Sergeant, MD  memantine (NAMENDA) 10 MG tablet TAKE 1 TABLET BY MOUTH TWICE DAILY 03/15/15   [name], NP  Multiple Vitamin (MULTIVITAMIN WITH MINERALS) TABS tablet Take 1 tablet by mouth daily.    Historical Provider, MD  NAMENDA XR 28 MG CP24 TAKE ONE CAPSULE BY MOUTH DAILY 11/09/14   Oneal Grout, MD  potassium chloride SA (K-DUR,KLOR-CON) 20 MEQ tablet TAKE 2 TABLETS BY MOUTH TWICE DAILY 01/04/15   [name], NP  traMADol (ULTRAM) 50 MG tablet TAKE 1-2 TABLETS BY MOUTH EVERY 8 HOURS AS NEEDED Patient taking differently: TAKE 1-2 TABLETS BY MOUTH EVERY 8 HOURS AS NEEDED FOR PAIN. 03/29/15   Kermit Balo, DO    Allergies  Allergen Reactions  . Penicillins Rash    Physical Exam  Vitals  Blood pressure 153/78, pulse 77, temperature 98.3 F  (36.8 C), temperature source Oral, resp. rate 21, SpO2 98 %.   General: Thin, well-developed pleasant male lying in bed in NAD  Psych:  Normal affect and insight, Not Suicidal or Homicidal, Awake Alert, Oriented X 3.  Neuro:   No F.N  deficits, ALL C.Nerves Intact, Strength 5/5 all 4 extremities, Sensation intact all 4 extremities.  ENT:  Ears and Eyes appear Normal, Conjunctivae clear, PER. Moist oral mucosa without erythema or exudates.  Neck:  Supple, No lymphadenopathy appreciated  Respiratory:  Symmetrical chest wall movement, Good air movement bilaterally, CTAB.  Cardiac:  RRR, No Murmurs, no LE edema noted, no JVD.    Abdomen:  Positive bowel sounds, Soft, Non tender, Non distended,  No masses appreciated  Skin:  No Cyanosis, Normal Skin Turgor, No Skin Rash or Bruise.  Extremities:  Able to move all 4. 5/5 strength in each,  no effusions.  Data Review  CBC  Recent Labs Lab 04/20/15 0524 04/21/15 0409 04/22/15 0900 04/24/15 0340  WBC 12.8* 12.6* 20.4* 14.6*  HGB 14.7 12.2* 13.5 10.8*  HCT 40.3 33.6* 36.8* 29.9*  PLT 184 185 221 222  MCV 82.6 82.2 81.8 82.6  MCH 30.1 29.8 30.0 29.8  MCHC 36.5* 36.3* 36.7* 36.1*  RDW 14.3 14.3 14.4 13.9  LYMPHSABS  --   --   --  0.7  MONOABS  --   --   --  1.2*  EOSABS  --   --   --  0.1  BASOSABS  --   --   --  0.0    Chemistries   Recent Labs Lab 04/20/15 0524 04/20/15 0604 04/21/15 0409 04/24/15 0340  NA 135  --  137 138  K 3.4*  --  3.1* 3.4*  CL 97*  --  102 106  CO2 22  --  23 26  GLUCOSE 114*  --  93 88  BUN 11  --  16 17  CREATININE 0.94  --  0.94 0.66  CALCIUM 9.3  --  8.3* 8.1*  AST  --  67* 40  --   ALT  --  81* 50  --   ALKPHOS  --  110 101  --   BILITOT  --  2.0* 2.6*  --      Coagulation profile  Recent Labs Lab 04/21/15 0409 04/22/15 0900  INR 1.40 1.37     Urinalysis    Component Value Date/Time   COLORURINE YELLOW 04/24/2015 0402   APPEARANCEUR CLEAR 04/24/2015 0402   LABSPEC  1.015 04/24/2015 0402   PHURINE 7.5 04/24/2015 0402   GLUCOSEU 100* 04/24/2015 0402   HGBUR NEGATIVE 04/24/2015 0402   BILIRUBINUR NEGATIVE 04/24/2015 0402   BILIRUBINUR Neg 08/12/2014 1051   KETONESUR NEGATIVE 04/24/2015 0402   PROTEINUR 30* 04/24/2015 0402   PROTEINUR Neg 08/12/2014 1051   UROBILINOGEN 1.0 04/24/2015 0402   UROBILINOGEN 0.2 08/12/2014 1051   NITRITE NEGATIVE 04/24/2015 0402   NITRITE Neg 08/12/2014 1051   LEUKOCYTESUR NEGATIVE 04/24/2015 0402    Imaging results:   Dg Chest 2 View  04/24/2015   CLINICAL DATA:  Hypoglycemia.  EXAM: CHEST  2 VIEW  COMPARISON:  Radiographs and CT 04/20/2015  FINDINGS: Masslike opacity in the periphery of the right lung, better defined on the current exam. Right pleural effusion persists. Left lung remains clear. There is no pneumothorax. The heart size is normal. No pulmonary edema.  IMPRESSION: Masslike opacity in the right lung, better defined on current exam. Right pleural effusion persists. No new abnormality.   Electronically Signed   By: Rubye Oaks M.D.   On: 04/24/2015 05:14   Ct Angio Chest Pe W/cm &/or Wo Cm  04/20/2015   CLINICAL DATA:  73 year old male with chest pain and cough  EXAM: CT  ANGIOGRAPHY CHEST WITH CONTRAST  TECHNIQUE: Multidetector CT imaging of the chest was performed using the standard protocol during bolus administration of intravenous contrast. Multiplanar CT image reconstructions and MIPs were obtained to evaluate the vascular anatomy.  CONTRAST:  80mL OMNIPAQUE IOHEXOL 350 MG/ML SOLN  COMPARISON:  Chest x-ray obtained earlier today ; prior chest x-ray 12/30/2014; prior chest CT 10/16/2005  FINDINGS: Mediastinum: Low right paratracheal lymph node measures 1.4 cm in short axis. Masslike consolidation centered in the posteromedial aspect of the right upper lobe partially encircles the trachea and abuts the esophagus within the middle mediastinum. A faint tissue plane remains present between the esophagus and the  soft tissue mass. The esophagus itself is otherwise unremarkable. No anterior mediastinal mass. Thoracic inlet and thyroid gland are unremarkable.  Heart/Vascular: Adequate opacification of the pulmonary arteries to the proximal subsegmental level. No evidence of central filling defect to suggest acute pulmonary embolus. The main pulmonary artery is enlarged measuring approximately 4.2 cm in diameter. This suggests underlying pulmonary arterial hypertension. Atherosclerotic calcifications are present along the course of the right and left anterior descending coronary arteries. The heart is within normal limits for size. No pericardial effusion. Normal caliber 3 vessel aorta.  Lungs/Pleura: Somewhat amorphous elongated region of masslike consolidation in the posteromedial aspect of the right upper lobe extending from the apex down along the trachea to the right hilum. This masslike consolidation appears to extend into the adjacent mediastinal fat. The mass is largest in craniocaudal dimension were in measures 9.2 cm. On the axial images the mass measures up to 5.6 x 4.4 cm. There is a partially loculated pleural effusion with a loculated fluid extending into the minor fissure. Associated right lower lobe atelectasis with volume loss and slight right-to-left shift of the mediastinal structures. Small volume superimposed consolidation in the inferior right lower lobe. Background of paraseptal emphysema. The left lung is clear.  Bones/Soft Tissues: No evidence of acute fracture. Nonspecific 1 cm (best measured on coronal image 91 of series 7) sclerotic focus in the posterior aspect of the T8 vertebral body just left of midline.  Upper Abdomen: Incompletely imaged water attenuation lesion in the upper pole of the left kidney likely represents a simple cyst. Otherwise, the visualized upper abdomen is unremarkable. The new  Review of the MIP images confirms the above findings.  IMPRESSION: 1. Negative for pulmonary  embolism. 2. Masslike consolidation extending from the right lung apex inferiorly along the mediastinum to the hilum. This region measures up to 9.2 cm in maximal dimension and is concerning for primary bronchogenic carcinoma. A masslike region of pneumonia, or other infectious/inflammatory process is possible but considered less likely. Recommend further evaluation with bronchoscopy. If the bronchoscopic and clinical findings are also consistent with malignancy, staging PET-CT may be warranted. 3. Nonspecific 1 cm sclerotic focus in the T8 vertebral body concerning for possible osseous metastatic disease. 4. Mildly enlarged low right paratracheal lymph node concerning for metastatic disease versus reactive adenopathy. 5. Small to moderate loculated right pleural effusion is presumed malignant or parapneumonic. 6. Right lower lobe atelectasis with a small amount of superimposed consolidation which may represent pneumonia or pneumonitis. 7. Atherosclerotic calcification along the course of the coronary arteries. Please note that although the presence of coronary artery calcium documents the presence of coronary artery disease, the severity of this disease and any potential stenosis cannot be assessed on this non-gated CT examination. Assessment for potential risk factor modification, dietary therapy or pharmacologic therapy may be warranted, if clinically indicated.  8. Enlarged main pulmonary artery as can be seen in the setting of pulmonary arterial hypertension.   Electronically Signed   By: Malachy Moan M.D.   On: 04/20/2015 08:07   US Abdomen Limited  04/20/2015   CLINICAL DATA:  Abdominal pain.  Chest pain since 3 a.m.  EXAM: US ABDOMEN LIMITED - RIGHT UPPER QUADRANT  COMPARISON:  Chest CT 04/20/2015  FINDINGS: Gallbladder:  Multiple echogenic foci in the gallbladder are compatible with stones and sludge. Gallbladder wall is not thickened, measuring up to 2 mm. The patient does not have a sonographic  Murphy's sign.  Common bile duct:  Diameter: 3 mm  Liver:  No focal lesion identified. Within normal limits in parenchymal echogenicity. Main portal vein is patent.  IMPRESSION: Cholelithiasis.  No biliary dilatation.   Electronically Signed   By: Richarda Overlie M.D.   On: 04/20/2015 09:26   Dg Chest Port 1 View  04/20/2015   CLINICAL DATA:  Right lower chest pain, shortness of breath and weakness.  EXAM: PORTABLE CHEST - 1 VIEW  COMPARISON:  12/30/2014  FINDINGS: Lung volumes are low. There is right pleural effusion and ill-defined right basilar opacity. The heart size and mediastinal contours are unchanged allowing for differences in technique. Increased bronchovascular markings likely related to low lung volumes. No pneumothorax.  IMPRESSION: Right pleural effusion with ill-defined right basilar opacity, suspect pneumonia with parapneumonic effusion. Pleural effusion causing right basilar atelectasis is alternatively considered.   Electronically Signed   By: Rubye Oaks M.D.   On: 04/20/2015 06:07    No EKG performed.   Assessment & Plan  Principal Problem:   Hypoglycemia Active Problems:   Anemia   Protein-calorie malnutrition, severe   Type 2 diabetes mellitus with diabetic neuropathy   Pleural effusion, right   Lung mass   Hypoglycemia Secondary to weight loss and likely hypermetabolism due to lung mass, with continued use (or overuse) of metformin and glipizide.  Patient last took meds on the evening of 6/2, but CBGs still continue to drop on D10.  Will monitor in stepdown frequently checking CBGs.  Remain on D10 with PRN D50.  Will d/c D10 drip when patient is no longer requiring PRN D50 and his CBGs have remained above 200 for more than 2 hours.   Lung Mass Patient needs IR biopsy and subsequent Oncology referral.  Unfortunately biopsies are not done on the weekends.  Recommend scheduling this ASAP.  Discussed lung mass with son - this is likely lung cancer and is likely to be very  serious.  Son understood and plans to see the patient's PCP about making him an official DNR.  Pleural effusion with possible PNA. Patient was discharged on 6/1 on Levaquin for PNA.  Will complete the antibiotic course with three more days of oral levaquin.  Severe malnutrition Secondary to unintentional weight loss. Will place on ensure complete bid and encourage snacks.  Consultants Called:  None  Family Communication:   Discussed with Son, Aric via phone  Code Status:  DNR  Condition:  Guarded.  Potential Disposition: to home in 24-48 hours.  Time spent in minutes : [address]  PA-C on 04/24/2015 at 8:11 AM Between 7am to 7pm - Pager - [phone] After 7pm go to [email or web address] - password [email or web address] And look for the night coverage person covering me after hours  [facility]

## 2015-04-24 NOTE — ED Notes (Signed)
Patient transported to X-ray 

## 2015-04-24 NOTE — ED Notes (Signed)
Pts daughter-in-law, Alesia MorinMontrel, was called and informed of p[facility] and that they have just left with him.

## 2015-04-24 NOTE — ED Notes (Signed)
I took CBG and got result of 44 mg./dcltr. Nurse Rugth notified Dr. Nicanor AlconPalumbo.

## 2015-04-24 NOTE — Progress Notes (Signed)
Hypoglycemic Event  CBG: 63  Treatment: D50 IV 50 mL  Symptoms: None  Follow-up CBG: Time:0733 CBG Result:104  Possible Reasons for Event: Medication regimen: Patient taking wrong medication at home  Comments/MD notified:[facility]     Stefani DamaKelley, Carmelita Amparo F

## 2015-04-24 NOTE — ED Notes (Signed)
Please call Daughter-in-law, Gary Mcneil, [phone], when pt is transferred.   If no answer, call pts son, Gary Mcneil, Gary Mcneil,  [phone].

## 2015-04-24 NOTE — Plan of Care (Signed)
Pt bp 165/78, systolic AM BP trending 150's, paged PA [place], provided prn order for BP.

## 2015-04-24 NOTE — ED Notes (Signed)
Per ems: Patient has had 2 episodes of Low blood sugar today. This is the second. Corrected with D50 - blood sugar now 104 via ems. The patients family felt he should be evaluated.

## 2015-04-25 DIAGNOSIS — E119 Type 2 diabetes mellitus without complications: Secondary | ICD-10-CM

## 2015-04-25 DIAGNOSIS — R918 Other nonspecific abnormal finding of lung field: Secondary | ICD-10-CM | POA: Diagnosis present

## 2015-04-25 DIAGNOSIS — E43 Unspecified severe protein-calorie malnutrition: Secondary | ICD-10-CM | POA: Diagnosis present

## 2015-04-25 DIAGNOSIS — R739 Hyperglycemia, unspecified: Secondary | ICD-10-CM | POA: Diagnosis present

## 2015-04-25 DIAGNOSIS — E41 Nutritional marasmus: Secondary | ICD-10-CM

## 2015-04-25 DIAGNOSIS — J189 Pneumonia, unspecified organism: Secondary | ICD-10-CM

## 2015-04-25 DIAGNOSIS — R112 Nausea with vomiting, unspecified: Secondary | ICD-10-CM

## 2015-04-25 DIAGNOSIS — J948 Other specified pleural conditions: Secondary | ICD-10-CM

## 2015-04-25 DIAGNOSIS — J984 Other disorders of lung: Secondary | ICD-10-CM

## 2015-04-25 LAB — CBC
HCT: 34.4 % — ABNORMAL LOW (ref 39.0–52.0)
HEMOGLOBIN: 12.7 g/dL — AB (ref 13.0–17.0)
MCH: 29.9 pg (ref 26.0–34.0)
MCHC: 36.9 g/dL — AB (ref 30.0–36.0)
MCV: 80.9 fL (ref 78.0–100.0)
Platelets: 250 10*3/uL (ref 150–400)
RBC: 4.25 MIL/uL (ref 4.22–5.81)
RDW: 14.4 % (ref 11.5–15.5)
WBC: 17.5 10*3/uL — ABNORMAL HIGH (ref 4.0–10.5)

## 2015-04-25 LAB — CBC WITH DIFFERENTIAL/PLATELET
Basophils Absolute: 0.2 10*3/uL — ABNORMAL HIGH (ref 0.0–0.1)
Basophils Relative: 1 % (ref 0–1)
EOS ABS: 0 10*3/uL (ref 0.0–0.7)
Eosinophils Relative: 0 % (ref 0–5)
HEMATOCRIT: 36.7 % — AB (ref 39.0–52.0)
Hemoglobin: 13.5 g/dL (ref 13.0–17.0)
Lymphocytes Relative: 9 % — ABNORMAL LOW (ref 12–46)
Lymphs Abs: 1.4 10*3/uL (ref 0.7–4.0)
MCH: 29.5 pg (ref 26.0–34.0)
MCHC: 36.8 g/dL — AB (ref 30.0–36.0)
MCV: 80.1 fL (ref 78.0–100.0)
MONOS PCT: 9 % (ref 3–12)
Monocytes Absolute: 1.4 10*3/uL — ABNORMAL HIGH (ref 0.1–1.0)
NEUTROS ABS: 13 10*3/uL — AB (ref 1.7–7.7)
NEUTROS PCT: 81 % — AB (ref 43–77)
Platelets: 306 10*3/uL (ref 150–400)
RBC: 4.58 MIL/uL (ref 4.22–5.81)
RDW: 14.3 % (ref 11.5–15.5)
WBC: 16 10*3/uL — AB (ref 4.0–10.5)

## 2015-04-25 LAB — COMPREHENSIVE METABOLIC PANEL
ALBUMIN: 2.2 g/dL — AB (ref 3.5–5.0)
ALBUMIN: 2.3 g/dL — AB (ref 3.5–5.0)
ALT: 57 U/L (ref 17–63)
ALT: 68 U/L — ABNORMAL HIGH (ref 17–63)
AST: 67 U/L — ABNORMAL HIGH (ref 15–41)
AST: 73 U/L — ABNORMAL HIGH (ref 15–41)
Alkaline Phosphatase: 150 U/L — ABNORMAL HIGH (ref 38–126)
Alkaline Phosphatase: 156 U/L — ABNORMAL HIGH (ref 38–126)
Anion gap: 11 (ref 5–15)
Anion gap: 12 (ref 5–15)
BUN: 8 mg/dL (ref 6–20)
BUN: 14 mg/dL (ref 6–20)
CALCIUM: 8.2 mg/dL — AB (ref 8.9–10.3)
CHLORIDE: 97 mmol/L — AB (ref 101–111)
CO2: 21 mmol/L — ABNORMAL LOW (ref 22–32)
CO2: 22 mmol/L (ref 22–32)
Calcium: 8.7 mg/dL — ABNORMAL LOW (ref 8.9–10.3)
Chloride: 95 mmol/L — ABNORMAL LOW (ref 101–111)
Creatinine, Ser: 0.6 mg/dL — ABNORMAL LOW (ref 0.61–1.24)
Creatinine, Ser: 0.93 mg/dL (ref 0.61–1.24)
GFR calc Af Amer: >60 mL/min (ref >60)
GFR calc non Af Amer: >60 mL/min (ref >60)
GLUCOSE: 267 mg/dL — AB (ref 65–99)
Glucose, Bld: 200 mg/dL — ABNORMAL HIGH (ref 65–99)
Potassium: 4.4 mmol/L (ref 3.5–5.1)
Potassium: 3.9 mmol/L (ref 3.5–5.1)
SODIUM: 131 mmol/L — AB (ref 135–145)
Sodium: 127 mmol/L — ABNORMAL LOW (ref 135–145)
TOTAL PROTEIN: 6.2 g/dL — AB (ref 6.5–8.1)
Total Bilirubin: 2.3 mg/dL — ABNORMAL HIGH (ref 0.3–1.2)
Total Bilirubin: 1 mg/dL (ref 0.3–1.2)
Total Protein: 7 g/dL (ref 6.5–8.1)

## 2015-04-25 LAB — GLUCOSE, CAPILLARY
GLUCOSE-CAPILLARY: 223 mg/dL — AB (ref 65–99)
GLUCOSE-CAPILLARY: 298 mg/dL — AB (ref 65–99)
Glucose-Capillary: 114 mg/dL — ABNORMAL HIGH (ref 65–99)
Glucose-Capillary: 167 mg/dL — ABNORMAL HIGH (ref 65–99)
Glucose-Capillary: 177 mg/dL — ABNORMAL HIGH (ref 65–99)
Glucose-Capillary: 182 mg/dL — ABNORMAL HIGH (ref 65–99)
Glucose-Capillary: 187 mg/dL — ABNORMAL HIGH (ref 65–99)
Glucose-Capillary: 203 mg/dL — ABNORMAL HIGH (ref 65–99)
Glucose-Capillary: 206 mg/dL — ABNORMAL HIGH (ref 65–99)
Glucose-Capillary: 215 mg/dL — ABNORMAL HIGH (ref 65–99)
Glucose-Capillary: 258 mg/dL — ABNORMAL HIGH (ref 65–99)

## 2015-04-25 LAB — MAGNESIUM: MAGNESIUM: 2.2 mg/dL (ref 1.7–2.4)

## 2015-04-25 MED ORDER — SODIUM CHLORIDE 0.9 % IV SOLN
12.5000 mg | Freq: Three times a day (TID) | INTRAVENOUS | Status: DC | PRN
  Filled 2015-04-25: qty 0.5

## 2015-04-25 MED ORDER — SODIUM CHLORIDE 0.9 % IV SOLN
INTRAVENOUS | Status: DC
  Administered 2015-04-25: 75 mL/h via INTRAVENOUS
  Administered 2015-04-26: via INTRAVENOUS

## 2015-04-25 MED ORDER — MORPHINE SULFATE 2 MG/ML IJ SOLN
2.0000 mg | INTRAMUSCULAR | Status: DC | PRN
  Administered 2015-04-25 – 2015-04-26 (×2): 2 mg via INTRAVENOUS
  Filled 2015-04-25 (×2): qty 1

## 2015-04-25 MED ORDER — INSULIN ASPART 100 UNIT/ML ~~LOC~~ SOLN
0.0000 [IU] | SUBCUTANEOUS | Status: DC
  Administered 2015-04-25: 5 [IU] via SUBCUTANEOUS
  Administered 2015-04-26: 3 [IU] via SUBCUTANEOUS
  Administered 2015-04-26: 8 [IU] via SUBCUTANEOUS
  Administered 2015-04-26: 2 [IU] via SUBCUTANEOUS

## 2015-04-25 NOTE — Progress Notes (Signed)
Gary Mcneil  Gary Mcneil:[id number] DOB: 02/18/1941 DOA: 04/24/2015 PCP: Gary Seller, NP  Admit HPI / Brief Narrative: Gary Mcneil is a 74 y.o. BM PMHx diabetes, mild dementia, HTN, HLD Dx on 5/31 with a right lung mass. He was found to have a CBG of 13 on 6/3.   Per his son, Gary Mcneil, the patient was recently taken off of metformin and glipizide. The pharmacy refilled his medications and included glipizide and metformin as well. The son placed these medications in the patient's med box, and consequently the patient took them. The son reports that as the patient has developed dementia he frequently takes extra pills. The son noticed on the evening of 6/2 that the patient was lethargic. He took the patient to his home fed him and kept him there to check CBGs for 24 hours. On the evening of 6/3, the patient was taken back to his home fed and put to bed. His daughter-in-law checked on him later that evening and the patient could not wake up to answer the door. EMS found his blood sugar to be 13, and the patient was brought in for treatment. Reportedly Gary Mcneil last took glipizide and metformin on the evening of 6/2, yet this morning (6/4) after multiple doses of D50 and a continuous D10 infusion, the patient's CBGs continue to drop. He has been admitted to stepdown.  In the ER: Chest x-ray shows a large mass in the right lung and right pleural effusion. Potassium is 3.4, white count is 20.4. Hemoglobin has dropped from 13.5-10.8 with dilution.  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  Assessment/Plan: Diabetes type 2 controlled -5/31 hemoglobin A1c= 6.5 -Obtain lipid panel  Hypoglycemia/Hyperglycemia -Resolved  -Patient CBGs now running high -Start moderate SSI  Right Lung Mass -Large right lower lobe lung mass with loculated effusion, IR biopsy on 6/6    Pleural effusion with possible CAP. -Patient was discharged on 6/1 on Levaquin for PNA. Will complete the antibiotic course with three more days of oral levaquin. -See right lung mass  Severe malnutrition -Secondary to unintentional weight loss. -Will place on ensure complete bid and encourage snacks.  Pain -Restart PRN morphine  Nausea and vomiting refractory to Zofran -When necessary Thorazine 12.5 mg TID PRN    Code Status: FULL Family Communication: no family present at time of exam Disposition Plan: Diagnosis of right lung mass    Consultants: Dr.Kevin Allred (IR)    Procedure/Significant Events: 5/31 CT angiogram chest PE protocol;Negative PE  - Masslike consolidation extending from the right lung apex inferiorly along the mediastinum to the hilum. Concerning for primary bronchogenic carcinoma. -1 cm sclerotic focus in the T8 vertebral body possible osseous metastatic disease. - Mildly enlarged low right paratracheal lymph nodel; metastatic Dz  vs Reactive adenopathy? -Small to moderate loculated right pleural effusion is presumed malignant or parapneumonic.   Culture   Antibiotics: Levofloxacin 6/1>>  DVT prophylaxis: Subcutaneous heparin; discontinued admin night. Restart after biopsy   Devices   LINES / TUBES:      Continuous Infusions: . sodium chloride 75 mL/hr at 04/25/15 2000  . dextrose 10 mL/hr at 04/25/15 2000    Objective: VITAL SIGNS: Temp: 98.6 F (37 C) (06/05 1955) Temp Source: Oral (06/05 1955) BP: 158/104 mmHg (06/05 2200) Pulse Rate: 98 (06/05 2200) SPO2; FIO2:   Intake/Output Summary (Last 24 hours) at 04/25/15 2214 Last data  filed at 04/25/15 2200  Gross per 24 hour  Intake   2695 ml  Output   1225 ml  Net   1470 ml     Exam: General: A/O 4, NAD, No acute respiratory distress Eyes: Negative headache, eye pain, double vision, retinal hemorrhage ENT: Negative Runny nose,  Neck:  Negative scars, masses,  torticollis, lymphadenopathy, JVD Lungs: Clear to auscultation bilaterally, except for decreased RLL breath sounds, without wheezes or crackles Cardiovascular: Regular rate and rhythm without murmur gallop or rub normal S1 and S2 Abdomen:negative abdominal pain, negative dysphagia, Nontender, nondistended, soft, bowel sounds positive, no rebound, no ascites, no appreciable mass Extremities: No significant cyanosis, clubbing, or edema bilateral lower extremities Psychiatric:  Negative depression, negative anxiety, negative fatigue, negative mania  Neurologic:  Cranial nerves II through XII intact, tongue/uvula midline, all extremities muscle strength 5/5, sensation intact throughout, negative dysarthria, negative expressive aphasia, negative receptive aphasia.      Data Reviewed: Basic Metabolic Panel:  Recent Labs Lab 04/20/15 0524 04/21/15 0409 04/24/15 0340 04/24/15 0810 04/25/15 0230 04/25/15 1830  NA 135 137 138  --  127* 131*  K 3.4* 3.1* 3.4*  --  4.4 3.9  CL 97* 102 106  --  95* 97*  CO2 22 23 26   --  21* 22  GLUCOSE 114* 93 88  --  200* 267*  BUN 11 16 17   --  8 14  CREATININE 0.94 0.94 0.66 0.56* 0.60* 0.93  CALCIUM 9.3 8.3* 8.1*  --  8.2* 8.7*  MG  --   --   --   --   --  2.2   Liver Function Tests:  Recent Labs Lab 04/20/15 0604 04/21/15 0409 04/25/15 0230 04/25/15 1830  AST 67* 40 67* 73*  ALT 81* 50 57 68*  ALKPHOS 110 101 150* 156*  BILITOT 2.0* 2.6* 2.3* 1.0  PROT 7.3 6.1* 6.2* 7.0  ALBUMIN 3.4* 2.6* 2.2* 2.3*   No results for input(s): LIPASE, AMYLASE in the last 168 hours. No results for input(s): AMMONIA in the last 168 hours. CBC:  Recent Labs Lab 04/22/15 0900 04/24/15 0340 04/24/15 0810 04/25/15 0230 04/25/15 1830  WBC 20.4* 14.6* 14.7* 17.5* 16.0*  NEUTROABS  --  12.6*  --   --  13.0*  HGB 13.5 10.8* 11.4* 12.7* 13.5  HCT 36.8* 29.9* 30.9* 34.4* 36.7*  MCV 81.8 82.6 81.3 80.9 80.1  PLT 221 222 220 250 306   Cardiac Enzymes: No  results for input(s): CKTOTAL, CKMB, CKMBINDEX, TROPONINI in the last 168 hours. BNP (last 3 results)  Recent Labs  04/20/15 0524  BNP 66.8    ProBNP (last 3 results) No results for input(s): PROBNP in the last 8760 hours.  CBG:  Recent Labs Lab 04/25/15 1102 04/25/15 1321 04/25/15 1531 04/25/15 1720 04/25/15 1953  GLUCAP 203* 182* 167* 258* 298*    Recent Results (from the past 240 hour(s))  Culture, blood (routine x 2)     Status: None (Preliminary result)   Collection Time: 04/20/15  8:40 AM  Result Value Ref Range Status   Specimen Description BLOOD ARM RIGHT  Final   Special Requests BOTTLES DRAWN AEROBIC AND ANAEROBIC 5CC  Final   Culture   Final           BLOOD CULTURE RECEIVED NO GROWTH TO DATE CULTURE WILL BE HELD FOR 5 DAYS BEFORE ISSUING A FINAL NEGATIVE REPORT Performed at [facility]    Report Status PENDING  Incomplete  Culture, blood (  routine x 2)     Status: None (Preliminary result)   Collection Time: 04/20/15  8:48 AM  Result Value Ref Range Status   Specimen Description BLOOD RIGHT FOREARM  Final   Special Requests BOTTLES DRAWN AEROBIC AND ANAEROBIC 5CC  Final   Culture   Final           BLOOD CULTURE RECEIVED NO GROWTH TO DATE CULTURE WILL BE HELD FOR 5 DAYS BEFORE ISSUING A FINAL NEGATIVE REPORT Performed at [facility]    Report Status PENDING  Incomplete  MRSA PCR Screening     Status: None   Collection Time: 04/24/15  6:50 AM  Result Value Ref Range Status   MRSA by PCR NEGATIVE NEGATIVE Final    Comment:        The GeneXpert MRSA Assay (FDA approved for NASAL specimens only), is one component of a comprehensive MRSA colonization surveillance program. It is not intended to diagnose MRSA infection nor to guide or monitor treatment for MRSA infections.      Studies:  Recent x-ray studies have been reviewed in detail by the Attending Physician  Scheduled Meds:  Scheduled Meds: . donepezil  5 mg Oral QHS  .  feeding supplement (ENSURE ENLIVE)  237 mL Oral BID BM  . heparin  5,000 Units Subcutaneous 3 times per day  . insulin aspart  0-15 Units Subcutaneous 6 times per day  . levofloxacin  500 mg Oral Daily  . memantine  10 mg Oral BID  . potassium chloride SA  40 mEq Oral BID  . sodium chloride  3 mL Intravenous Q12H    Time spent on care of this patient: 40 mins   WOODS, Roselind Messier , MD  [facility] Office  [phone] Pager - [phone]  On-Call/Text Page:      [email or web address]      password [email or web address]  If 7PM-7AM, please contact night-coverage [email or web address] Password [email or web address] 04/25/2015, 10:14 PM   LOS: 1 day   Care during the described time interval was provided by me .  I have reviewed this patient's available data, including medical history, events of Mcneil, physical examination, and all test results as part of my evaluation. I have personally reviewed and interpreted all radiology studies.   Carolyne Littles, MD [phone] Pager

## 2015-04-25 NOTE — Progress Notes (Signed)
Patient refused 0400 CBG. Patient was educated on the importance of CBG checks. He continues to refuse. Will try again at 0600. Patient will continue to be monitored.

## 2015-04-26 ENCOUNTER — Inpatient Hospital Stay (HOSPITAL_COMMUNITY): Payer: Medicare Other

## 2015-04-26 DIAGNOSIS — E162 Hypoglycemia, unspecified: Secondary | ICD-10-CM

## 2015-04-26 LAB — LIPID PANEL
Cholesterol: 161 mg/dL (ref 0–200)
HDL: 30 mg/dL — ABNORMAL LOW (ref >40)
LDL Cholesterol: 111 mg/dL — ABNORMAL HIGH (ref 0–99)
Total CHOL/HDL Ratio: 5.4 RATIO
Triglycerides: 101 mg/dL (ref <150)
VLDL: 20 mg/dL (ref 0–40)

## 2015-04-26 LAB — CBC WITH DIFFERENTIAL/PLATELET
Basophils Absolute: 0.2 10*3/uL — ABNORMAL HIGH (ref 0.0–0.1)
Basophils Relative: 1 % (ref 0–1)
EOS ABS: 0 10*3/uL (ref 0.0–0.7)
EOS PCT: 0 % (ref 0–5)
HCT: 36 % — ABNORMAL LOW (ref 39.0–52.0)
HEMOGLOBIN: 13.3 g/dL (ref 13.0–17.0)
LYMPHS ABS: 1.5 10*3/uL (ref 0.7–4.0)
Lymphocytes Relative: 10 % — ABNORMAL LOW (ref 12–46)
MCH: 29.8 pg (ref 26.0–34.0)
MCHC: 36.9 g/dL — ABNORMAL HIGH (ref 30.0–36.0)
MCV: 80.5 fL (ref 78.0–100.0)
MONOS PCT: 10 % (ref 3–12)
Monocytes Absolute: 1.5 10*3/uL — ABNORMAL HIGH (ref 0.1–1.0)
Neutro Abs: 12 10*3/uL — ABNORMAL HIGH (ref 1.7–7.7)
Neutrophils Relative %: 79 % — ABNORMAL HIGH (ref 43–77)
Platelets: 284 10*3/uL (ref 150–400)
RBC: 4.47 MIL/uL (ref 4.22–5.81)
RDW: 14.4 % (ref 11.5–15.5)
WBC: 15.2 10*3/uL — ABNORMAL HIGH (ref 4.0–10.5)

## 2015-04-26 LAB — PROTIME-INR
INR: 1.26 (ref 0.00–1.49)
PROTHROMBIN TIME: 15.9 s — AB (ref 11.6–15.2)

## 2015-04-26 LAB — COMPREHENSIVE METABOLIC PANEL
ALT: 63 U/L (ref 17–63)
ANION GAP: 9 (ref 5–15)
AST: 44 U/L — ABNORMAL HIGH (ref 15–41)
Albumin: 2.3 g/dL — ABNORMAL LOW (ref 3.5–5.0)
Alkaline Phosphatase: 143 U/L — ABNORMAL HIGH (ref 38–126)
BILIRUBIN TOTAL: 1.1 mg/dL (ref 0.3–1.2)
BUN: 11 mg/dL (ref 6–20)
CALCIUM: 8.5 mg/dL — AB (ref 8.9–10.3)
CHLORIDE: 104 mmol/L (ref 101–111)
CO2: 23 mmol/L (ref 22–32)
Creatinine, Ser: 0.69 mg/dL (ref 0.61–1.24)
GFR calc non Af Amer: >60 mL/min (ref >60)
Glucose, Bld: 134 mg/dL — ABNORMAL HIGH (ref 65–99)
POTASSIUM: 4.2 mmol/L (ref 3.5–5.1)
SODIUM: 136 mmol/L (ref 135–145)
Total Protein: 6.5 g/dL (ref 6.5–8.1)

## 2015-04-26 LAB — GLUCOSE, CAPILLARY
GLUCOSE-CAPILLARY: 133 mg/dL — AB (ref 65–99)
GLUCOSE-CAPILLARY: 109 mg/dL — AB (ref 65–99)
GLUCOSE-CAPILLARY: 267 mg/dL — AB (ref 65–99)
Glucose-Capillary: 123 mg/dL — ABNORMAL HIGH (ref 65–99)
Glucose-Capillary: 139 mg/dL — ABNORMAL HIGH (ref 65–99)
Glucose-Capillary: 172 mg/dL — ABNORMAL HIGH (ref 65–99)

## 2015-04-26 LAB — CULTURE, BLOOD (ROUTINE X 2)
Culture: NO GROWTH
Culture: NO GROWTH

## 2015-04-26 LAB — MAGNESIUM: MAGNESIUM: 2 mg/dL (ref 1.7–2.4)

## 2015-04-26 LAB — LACTIC ACID, PLASMA: LACTIC ACID, VENOUS: 1.1 mmol/L (ref 0.5–2.0)

## 2015-04-26 MED ORDER — LIDOCAINE HCL (PF) 1 % IJ SOLN
INTRAMUSCULAR | Status: AC
  Filled 2015-04-26: qty 30

## 2015-04-26 MED ORDER — FENTANYL CITRATE (PF) 100 MCG/2ML IJ SOLN
INTRAMUSCULAR | Status: AC
  Filled 2015-04-26: qty 2

## 2015-04-26 MED ORDER — PNEUMOCOCCAL VAC POLYVALENT 25 MCG/0.5ML IJ INJ
0.5000 mL | INJECTION | INTRAMUSCULAR | Status: AC
  Administered 2015-04-27: 0.5 mL via INTRAMUSCULAR
  Filled 2015-04-26: qty 0.5

## 2015-04-26 MED ORDER — LEVOFLOXACIN IN D5W 750 MG/150ML IV SOLN
750.0000 mg | INTRAVENOUS | Status: DC
  Administered 2015-04-27 – 2015-04-29 (×3): 750 mg via INTRAVENOUS
  Filled 2015-04-26 (×4): qty 150

## 2015-04-26 MED ORDER — MIDAZOLAM HCL 2 MG/2ML IJ SOLN
INTRAMUSCULAR | Status: AC
  Filled 2015-04-26: qty 2

## 2015-04-26 MED ORDER — INSULIN ASPART 100 UNIT/ML ~~LOC~~ SOLN
0.0000 [IU] | Freq: Three times a day (TID) | SUBCUTANEOUS | Status: DC
  Administered 2015-04-27: 3 [IU] via SUBCUTANEOUS
  Administered 2015-04-27: 2 [IU] via SUBCUTANEOUS
  Administered 2015-04-28: 1 [IU] via SUBCUTANEOUS
  Administered 2015-04-28: 3 [IU] via SUBCUTANEOUS
  Administered 2015-04-28: 2 [IU] via SUBCUTANEOUS
  Administered 2015-04-29 (×3): 3 [IU] via SUBCUTANEOUS
  Administered 2015-04-30: 4 [IU] via SUBCUTANEOUS

## 2015-04-26 MED ORDER — MORPHINE SULFATE 2 MG/ML IJ SOLN
2.0000 mg | INTRAMUSCULAR | Status: DC | PRN
  Administered 2015-04-27 – 2015-04-28 (×4): 2 mg via INTRAVENOUS
  Filled 2015-04-26 (×4): qty 1

## 2015-04-26 MED ORDER — VANCOMYCIN HCL 10 G IV SOLR
1500.0000 mg | Freq: Once | INTRAVENOUS | Status: AC
Start: 2015-04-26 — End: 2015-04-27
  Administered 2015-04-26: 1500 mg via INTRAVENOUS
  Filled 2015-04-26: qty 1500

## 2015-04-26 MED ORDER — VANCOMYCIN HCL IN DEXTROSE 750-5 MG/150ML-% IV SOLN
750.0000 mg | Freq: Three times a day (TID) | INTRAVENOUS | Status: AC
  Administered 2015-04-27 – 2015-04-30 (×9): 750 mg via INTRAVENOUS
  Filled 2015-04-26 (×10): qty 150

## 2015-04-26 NOTE — Evaluation (Addendum)
Occupational Therapy Evaluation Patient Details Name: Gary Mcneil MRN: [id number] DOB: [birth year] Today's Date: 04/26/2015    History of Present Illness 74 y.o. with PMHx of diabetes, mild dementia, HTN, HLD Dx on 5/31 with a right lung mass. He was found to have a CBG of 13 on 6/3   Clinical Impression   Pt admitted with above. Per pt report, pt independent with ADLs, PTA. Feel pt will benefit from acute OT to increase activity tolerance prior to d/c. Pt stating he lives alone, but could go stay with son. Recommending at least initial 24/7 assist upon d/c and discussed with pt. See vital section below.    Follow Up Recommendations  No OT follow up;Supervision/Assistance - 24 hour    Equipment Recommendations  None recommended by OT    Recommendations for Other Services       Precautions / Restrictions Precautions Precautions: Fall Restrictions Weight Bearing Restrictions: No      Mobility Bed Mobility Overal bed mobility: Needs Assistance Bed Mobility: Sit to Supine     Sit to Supine: Supervision     Transfers Overall transfer level: Needs assistance Equipment used: None Transfers: Sit to/from Stand Sit to Stand: Supervision        Comments: Supervision for safety   Balance Balance not formally assessed. No LOB in session.                         ADL Overall ADL's : Needs assistance/impaired                     Lower Body Dressing: Min guard;Sit to/from stand   Toilet Transfer: Min guard;Ambulation (bed)   Toileting- Clothing Manipulation and Hygiene: Min guard;Sit to/from stand       Functional mobility during ADLs: Min guard General ADL Comments: Pt able to don/doff socks sitting EOB. Educated on safety such as sitting for most of LB ADLs and recommended pt use his shower chair. Cues for deep breathing technique as pt HR and RR elevated.     Vision  Pt reports no change from baseline.   Perception     Praxis       Pertinent Vitals/Pain Pain Assessment: 0-10 Pain Score: 5  Pain Location: head Pain Descriptors / Indicators: Headache Pain Intervention(s): Other (comment) (notified nurse)   HR up to 150 in session but seemed to be hanging around 120s. RR up to 30s. Nurse notified. Cues for deep breathing technique. BP 123/102 sitting at beginning of session (PT had just taken prior to OT coming in), 144/73 at end of session in supine.     Hand Dominance Right   Extremity/Trunk Assessment Upper Extremity Assessment Upper Extremity Assessment: Overall WFL for tasks assessed   Lower Extremity Assessment Lower Extremity Assessment: Defer to PT evaluation      Communication Communication Communication: No difficulties   Cognition Arousal/Alertness: Awake/alert Behavior During Therapy: Flat affect Overall Cognitive Status: History of cognitive impairments - at baseline                   General Comments       Exercises       Shoulder Instructions      Home Living Family/patient expects to be discharged to:: Private residence Living Arrangements:  (Pt states he does not live with son) Available Help at Discharge: Family Type of Home: House Home Access: Level entry     Home Layout: One level  Bathroom Shower/Tub: Tub/shower unit;Walk-in shower   Bathroom Toilet: Standard     Home Equipment: Environmental consultantWalker - 2 wheels;Cane - single point;Bedside commode   Additional Comments: unsure of accuracy of information-pt told OT/PT different information      Prior Functioning/Environment Level of Independence: Independent        Comments: Pt reports that he is independent with all ADL's, was able to ambulate in the community, and perform tasks around the house without assist.     OT Diagnosis: Other (comment) (Cardiopulmonary status limiting activity/decreased activity tolerance)   OT Problem List: Decreased activity tolerance;Cardiopulmonary status limiting activity;Decreased  knowledge of precautions;Decreased knowledge of use of DME or AE;Decreased cognition;Pain   OT Treatment/Interventions: Self-care/ADL training;DME and/or AE instruction;Energy conservation;Therapeutic activities;Cognitive remediation/compensation;Patient/family education;Balance training    OT Goals(Current goals can be found in the care plan section) Acute Rehab OT Goals Patient Stated Goal: not stated OT Goal Formulation: With patient Time For Goal Achievement: 05/03/15 Potential to Achieve Goals: Good ADL Goals Pt Will Perform Lower Body Bathing: with modified independence;sit to/from stand Pt Will Perform Lower Body Dressing: with modified independence;sit to/from stand Pt Will Transfer to Toilet: with modified independence;ambulating Pt Will Perform Tub/Shower Transfer: Tub transfer;Shower transfer;ambulating;with modified independence;shower seat  OT Frequency: Min 2X/week   Barriers to D/C:            Co-evaluation              End of Session Equipment Utilized During Treatment: Gait belt Nurse Communication: Other (comment) (HR/RR/BP/cognition)  Activity Tolerance: Patient tolerated treatment well Patient left: in bed;Other (comment) (nursing tech in room)   Time: [phone] OT Time Calculation (min): 10 min Charges:  OT General Charges $OT Visit: 1 Procedure OT Evaluation $Initial OT Evaluation Tier I: 1 Procedure G-CodesEarlie Raveling:    Liliya Fullenwider L OTR/L [phone] 04/26/2015, 10:28 AM

## 2015-04-26 NOTE — Progress Notes (Signed)
[place] TEAM 1 - Stepdown/ICU TEAM Progress Note  Gary Mcneil VHQ:[id number] DOB: 09/12/1941 DOA: 04/24/2015 PCP: [name], NP  Admit HPI / Brief Narrative: 74 y.o. M Hx diabetes, mild dementia, HTN, HLD, and a right lung mass diagnosed 5/31 who was found to have a CBG of 13 on 6/3.  The patient had been recently taken off metformin and glipizide but inadvertently continued to take them. On the evening of 6/3 the patient was taken to his home, fed, and put to bed. His daughter-in-law checked on him later that evening and the patient could not wake up to answer the door. EMS found his blood sugar to be 13, and the patient was brought in for treatment. After multiple doses of D50 and a continuous D10 infusion, the patient's CBGs continued to drop.   HPI/Subjective: The patient has no new complaints today.  He denies chest pain shortness of breath nausea vomiting or abdominal pain.  Assessment/Plan:  Right Lung Mass? > Necrotic PNA +/- empyema  -IR was to be attempted 6/6 but CT noted resolution of the R paratracheal abnormality (likely a pleural fluid collection) but noted complex/loculated R lateral chest pleural effusion increased in size w/ RLL consolidation and findings worrisome for necrosis suggesting possible empyema +/- lung abscess - will ask pulmonary to evaluate on 6/7 and assist with workup of possible empyema +/- thoracentesis - discussed findings with patient and son at great length at bedside  Diabetes type 2 uncontrolled w/ refractory hypoglycemia  -5/31 hemoglobin A1c 6.5 - due to medication error - follow CBG  Severe malnutrition - ensure complete bid and encourage snacks  Nausea and vomiting refractory to Zofran -When necessary Thorazine 12.5 mg TID PRN  Code Status: FULL Family Communication: Spoke with patient and son at bedside at length Disposition Plan: SDU  Consultants: IR  Procedures: None  Antibiotics: Levofloxacin 6/1 >  DVT  prophylaxis: Subcutaneous heparin  Objective: Blood pressure 129/87, pulse 105, temperature 98.5 F (36.9 C), temperature source Oral, resp. rate 19, SpO2 94 %.  Intake/Output Summary (Last 24 hours) at 04/26/15 1329 Last data filed at 04/26/15 1000  Gross per 24 hour  Intake   1930 ml  Output   1625 ml  Net    305 ml   Exam: General: No acute respiratory distress - alert and conversant Lungs: Clear to auscultation bilaterally without wheezes or crackles Cardiovascular: Regular rate and rhythm without murmur gallop or rub normal S1 and S2 Abdomen: Nontender, nondistended, soft, bowel sounds positive, no rebound, no ascites, no appreciable mass Extremities: No significant cyanosis, clubbing, or edema bilateral lower extremities  Data Reviewed: Basic Metabolic Panel:  Recent Labs Lab 04/21/15 0409 04/24/15 0340 04/24/15 0810 04/25/15 0230 04/25/15 1830 04/26/15 0259  NA 137 138  --  127* 131* 136  K 3.1* 3.4*  --  4.4 3.9 4.2  CL 102 106  --  95* 97* 104  CO2 23 26  --  21* 22 23  GLUCOSE 93 88  --  200* 267* 134*  BUN 16 17  --  CREATININE 0.94 0.66 0.56* 0.60* 0.93 0.69  CALCIUM 8.3* 8.1*  --  8.2* 8.7* 8.5*  MG  --   --   --   --  2.2 2.0   Liver Function Tests:  Recent Labs Lab 04/20/15 0604 04/21/15 0409 04/25/15 0230 04/25/15 1830 04/26/15 0259  AST 67* 40 67* 73* 44*  ALT 81* 50 57 68* 63  ALKPHOS  110 101 150* 156* 143*  BILITOT 2.0* 2.6* 2.3* 1.0 1.1  PROT 7.3 6.1* 6.2* 7.0 6.5  ALBUMIN 3.4* 2.6* 2.2* 2.3* 2.3*   CBC:  Recent Labs Lab 04/24/15 0340 04/24/15 0810 04/25/15 0230 04/25/15 1830 04/26/15 0259  WBC 14.6* 14.7* 17.5* 16.0* 15.2*  NEUTROABS 12.6*  --   --  13.0* 12.0*  HGB 10.8* 11.4* 12.7* 13.5 13.3  HCT 29.9* 30.9* 34.4* 36.7* 36.0*  MCV 82.6 81.3 80.9 80.1 80.5  PLT 222 220 250 306 284   CBG:  Recent Labs Lab 04/25/15 2221 04/25/15 2359 04/26/15 0400 04/26/15 0757 04/26/15 1136  GLUCAP 215* 172* 133* 109*  139*    Recent Results (from the past 240 hour(s))  Culture, blood (routine x 2)     Status: None   Collection Time: 04/20/15  8:40 AM  Result Value Ref Range Status   Specimen Description BLOOD ARM RIGHT  Final   Special Requests BOTTLES DRAWN AEROBIC AND ANAEROBIC 5CC  Final   Culture   Final    NO GROWTH 5 DAYS Performed at [facility]    Report Status 04/26/2015 FINAL  Final  Culture, blood (routine x 2)     Status: None   Collection Time: 04/20/15  8:48 AM  Result Value Ref Range Status   Specimen Description BLOOD RIGHT FOREARM  Final   Special Requests BOTTLES DRAWN AEROBIC AND ANAEROBIC 5CC  Final   Culture   Final    NO GROWTH 5 DAYS Performed at [facility]    Report Status 04/26/2015 FINAL  Final  MRSA PCR Screening     Status: None   Collection Time: 04/24/15  6:50 AM  Result Value Ref Range Status   MRSA by PCR NEGATIVE NEGATIVE Final    Comment:        The GeneXpert MRSA Assay (FDA approved for NASAL specimens only), is one component of a comprehensive MRSA colonization surveillance program. It is not intended to diagnose MRSA infection nor to guide or monitor treatment for MRSA infections.      Studies:  Recent x-ray studies have been reviewed in detail by the Attending Physician  Scheduled Meds:  Scheduled Meds: . donepezil  5 mg Oral QHS  . feeding supplement (ENSURE ENLIVE)  237 mL Oral BID BM  . fentaNYL      . insulin aspart  0-15 Units Subcutaneous 6 times per day  . lidocaine (PF)      . memantine  10 mg Oral BID  . midazolam      . potassium chloride SA  40 mEq Oral BID  . sodium chloride  3 mL Intravenous Q12H    Time spent on care of this patient: 35 mins  Lonia BloodJeffrey T. Yemariam Magar, MD [facility] For Consults/Admissions - Flow Manager - [phone] Office  [phone]  Contact MD directly via text page:      [email or web address]      password [email or web address]  04/26/2015, 1:29 PM   LOS: 2 days

## 2015-04-26 NOTE — Evaluation (Signed)
Physical Therapy Evaluation Patient Details Name: Gary Mcneil MRN: [id number] DOB: Oct 04, 1941 Today's Date: 04/26/2015   History of Present Illness  74 y.o. with PMHx of diabetes, mild dementia, HTN, HLD Dx on 5/31 with a right lung mass. He was found to have a CBG of 13 on 6/3  Clinical Impression  Pt admitted with above diagnosis. Pt currently with functional limitations due to the deficits listed below (see PT Problem List). At the time of PT eval pt was able to perform transfers and ambulation at a min guard assist to supervision level. BP generally high throughout session however RN aware and states this has been normal for him as he is not currently on his home BP meds. During ambulation, pt occasionally tachy with HR increasing into the high 150's at times.   Pt will benefit from skilled PT to increase their independence and safety with mobility to allow discharge to the venue listed below. Functionally, pt is likely near baseline. Will keep following acutely to improve balance and tolerance for functional activity. Do not feel he will need PT follow up at d/c, however 24 hour assist would be highly recommended.     Follow Up Recommendations No PT follow up;Supervision/Assistance - 24 hour    Equipment Recommendations  None recommended by PT    Recommendations for Other Services       Precautions / Restrictions Precautions Precautions: Fall Restrictions Weight Bearing Restrictions: No      Mobility  Bed Mobility Overal bed mobility: Needs Assistance Bed Mobility: Supine to Sit     Supine to sit: Supervision     General bed mobility comments: Pt was able to transition to EOB with no physical assistance. Supervision for safety.   Transfers Overall transfer level: Needs assistance Equipment used: None Transfers: Sit to/from Stand Sit to Stand: Supervision         General transfer comment: Min guard assist provided as this was the first assessment of mobility,  however pt really at a supervision level for sit<>stand. Demonstrated proper hand placement on seated surface for safety.   Ambulation/Gait Ambulation/Gait assistance: Min guard Ambulation Distance (Feet): 125 Feet Assistive device: None Gait Pattern/deviations: Step-through pattern;Decreased stride length;Staggering left;Staggering right Gait velocity: Decreased Gait velocity interpretation: Below normal speed for age/gender General Gait Details: Hands-on assist provided for safety. Pt was able to ambulate without an AD with occasional unsteadiness noted, however no physical assist required to recover.   Stairs            Wheelchair Mobility    Modified Rankin (Stroke Patients Only)       Balance Overall balance assessment: Needs assistance Sitting-balance support: Feet supported;No upper extremity supported Sitting balance-Leahy Scale: Fair     Standing balance support: No upper extremity supported Standing balance-Leahy Scale: Fair Standing balance comment: Static standing                             Pertinent Vitals/Pain Pain Assessment: No/denies pain    Home Living Family/patient expects to be discharged to:: Private residence Living Arrangements: Children (Pt adamant that he lives alone. Per chart/RN pt with son) Available Help at Discharge: Family Type of Home: House Home Access: Level entry     Home Layout: One level Home Equipment: Environmental consultant - 2 wheels;Cane - single point;Bedside commode Additional Comments: Unsure how accurate this info is. STM appears decreased    Prior Function Level of Independence: Independent  Comments: Pt reports that he is independent with all ADL's, was able to ambulate in the community, and perform tasks around the house without assist.      Hand Dominance   Dominant Hand: Right    Extremity/Trunk Assessment   Upper Extremity Assessment: Defer to OT evaluation           Lower Extremity  Assessment: Generalized weakness      Cervical / Trunk Assessment: Other exceptions  Communication   Communication: No difficulties  Cognition Arousal/Alertness: Awake/alert Behavior During Therapy: WFL for tasks assessed/performed Overall Cognitive Status: History of cognitive impairments - at baseline       Memory: Decreased short-term memory              General Comments      Exercises        Assessment/Plan    PT Assessment Patient needs continued PT services  PT Diagnosis Difficulty walking   PT Problem List Decreased strength;Decreased range of motion;Decreased activity tolerance;Decreased balance;Decreased mobility;Decreased knowledge of use of DME;Decreased safety awareness;Decreased knowledge of precautions;Cardiopulmonary status limiting activity  PT Treatment Interventions DME instruction;Gait training;Stair training;Functional mobility training;Therapeutic activities;Therapeutic exercise;Neuromuscular re-education;Patient/family education   PT Goals (Current goals can be found in the Care Plan section) Acute Rehab PT Goals Patient Stated Goal: Pt did not state goals during this session.  PT Goal Formulation: With patient Time For Goal Achievement: 05/03/15 Potential to Achieve Goals: Good    Frequency Min 3X/week   Barriers to discharge Decreased caregiver support Unsure how much assist is available at home. Pt adamant that he lives alone, however per chart review and RN, lives with son and D-I-L. Told OT son could be available 24 hours if needed.     Co-evaluation               End of Session Equipment Utilized During Treatment: Gait belt Activity Tolerance: Patient tolerated treatment well Patient left: in bed;Other (comment) (OT present at end of session) Nurse Communication: Mobility status         Time: [id number] PT Time Calculation (min) (ACUTE ONLY): 24 min   Charges:   PT Evaluation $Initial PT Evaluation Tier I: 1  Procedure PT Treatments $Gait Training: 8-22 mins   PT G Codes:        Conni SlipperKirkman, Daylan Juhnke 04/26/2015, 9:49 AM   Conni SlipperLaura Juliano Mceachin, PT, DPT Acute Rehabilitation Services Pager: [phone]

## 2015-04-26 NOTE — Progress Notes (Addendum)
ANTIBIOTIC CONSULT NOTE - INITIAL  Pharmacy Consult for vancomycin,levaquin Indication: PNA  Allergies  Allergen Reactions  . Penicillins Rash    Patient Measurements: Wt= 74.8kg  Vital Signs: Temp: 98.5 F (36.9 C) (06/06 1156) Temp Source: Oral (06/06 1156) BP: 143/80 mmHg (06/06 1900) Pulse Rate: 105 (06/06 1900) Intake/Output from previous day: 06/05 0701 - 06/06 0700 In: 2785 [P.O.:770; I.V.:2015] Out: 1925 [Urine:1925] Intake/Output from this shift:    Labs:  Recent Labs  04/25/15 0230 04/25/15 1830 04/26/15 0259  WBC 17.5* 16.0* 15.2*  HGB 12.7* 13.5 13.3  PLT 250 306 284  CREATININE 0.60* 0.93 0.69   Estimated Creatinine Clearance: 88.3 mL/min (by C-G formula based on Cr of 0.69). No results for input(s): VANCOTROUGH, VANCOPEAK, VANCORANDOM, GENTTROUGH, GENTPEAK, GENTRANDOM, TOBRATROUGH, TOBRAPEAK, TOBRARND, AMIKACINPEAK, AMIKACINTROU, AMIKACIN in the last 72 hours.   Microbiology: Recent Results (from the past 720 hour(s))  Culture, blood (routine x 2)     Status: None   Collection Time: 04/20/15  8:40 AM  Result Value Ref Range Status   Specimen Description BLOOD ARM RIGHT  Final   Special Requests BOTTLES DRAWN AEROBIC AND ANAEROBIC 5CC  Final   Culture   Final    NO GROWTH 5 DAYS Performed at [facility]    Report Status 04/26/2015 FINAL  Final  Culture, blood (routine x 2)     Status: None   Collection Time: 04/20/15  8:48 AM  Result Value Ref Range Status   Specimen Description BLOOD RIGHT FOREARM  Final   Special Requests BOTTLES DRAWN AEROBIC AND ANAEROBIC 5CC  Final   Culture   Final    NO GROWTH 5 DAYS Performed at [facility]    Report Status 04/26/2015 FINAL  Final  MRSA PCR Screening     Status: None   Collection Time: 04/24/15  6:50 AM  Result Value Ref Range Status   MRSA by PCR NEGATIVE NEGATIVE Final    Comment:        The GeneXpert MRSA Assay (FDA approved for NASAL specimens only), is one component of  a comprehensive MRSA colonization surveillance program. It is not intended to diagnose MRSA infection nor to guide or monitor treatment for MRSA infections.     Medical History: Past Medical History  Diagnosis Date  . Type II or unspecified type diabetes mellitus without mention of complication, uncontrolled   . DEMENTIA   . Neuromuscular disorder   . Hypertension   . Gout   . Arthritis   . Dementia 04/24/2012  . Unspecified hereditary and idiopathic peripheral neuropathy   . Anemia, unspecified   . Hypotension, unspecified   . Acute kidney failure, unspecified   . Unspecified vitamin D deficiency   . Other and unspecified hyperlipidemia   . Hypopotassemia   . Disorder of bone and cartilage, unspecified   . Memory loss   . Hypotension, unspecified   . Acute kidney failure, unspecified   . Muscle weakness (generalized)   . Dizziness and giddiness   . Myalgia and myositis, unspecified   . Screening for ischemic heart disease   . Type II or unspecified type diabetes mellitus without mention of complication, uncontrolled   . Gout, unspecified   . Tobacco use disorder   . Memory loss   . Special screening for malignant neoplasm of prostate   . GERD (gastroesophageal reflux disease)     Medications:  Prescriptions prior to admission  Medication Sig Dispense Refill Last Dose  . allopurinol (ZYLOPRIM) 100  MG tablet Take 2 tablets (200 mg total) by mouth daily. 60 tablet 3 04/24/2015 at Unknown time  . donepezil (ARICEPT) 10 MG tablet Start 5 mg (1/2 tablet) daily at bedtime for 1 month then increase to 10 mg (1 tablet) daily at bedtime for memory (Patient taking differently: Take 5 mg by mouth at bedtime. ) 30 tablet 4 04/23/2015 at Unknown time  . glipiZIDE (GLUCOTROL XL) 5 MG 24 hr tablet Take 5 mg by mouth daily.  2 04/24/2015 at Unknown time  . hydrochlorothiazide (HYDRODIURIL) 25 MG tablet TAKE 1 TABLET BY MOUTH DAILY 30 tablet 5 04/24/2015 at Unknown time  . memantine (NAMENDA)  10 MG tablet TAKE 1 TABLET BY MOUTH TWICE DAILY 60 tablet 2 04/24/2015 at Unknown time  . Multiple Vitamin (MULTIVITAMIN WITH MINERALS) TABS tablet Take 1 tablet by mouth daily.   04/24/2015 at Unknown time  . potassium chloride SA (K-DUR,KLOR-CON) 20 MEQ tablet TAKE 2 TABLETS BY MOUTH TWICE DAILY 120 tablet 3 04/24/2015 at Unknown time  . traMADol (ULTRAM) 50 MG tablet TAKE 1-2 TABLETS BY MOUTH EVERY 8 HOURS AS NEEDED (Patient taking differently: TAKE 1-2 TABLETS BY MOUTH EVERY 8 HOURS AS NEEDED FOR PAIN.) 120 tablet 0 04/23/2015 at Unknown time  . levofloxacin (LEVAQUIN) 500 MG tablet Take 1 tablet (500 mg total) by mouth daily. 5 tablet 0 never  . [DISCONTINUED] gabapentin (NEURONTIN) 300 MG capsule Take 1 capsule (300 mg total) by mouth 3 (three) times daily. 60 capsule 1 04/21/2015 at Unknown time  . [DISCONTINUED] glipiZIDE (GLUCOTROL XL) 5 MG 24 hr tablet TAKE 1 TABLET BY MOUTH DAILY TO CONTROL DIABETES 30 tablet 2 04/21/2015 at Unknown time  . [DISCONTINUED] metFORMIN (GLUCOPHAGE) 500 MG tablet TAKE 1 TABLET BY MOUTH DAILY 30 tablet 3 04/21/2015 at Unknown time   Scheduled:  . donepezil  5 mg Oral QHS  . feeding supplement (ENSURE ENLIVE)  237 mL Oral BID BM  . [START ON 04/27/2015] insulin aspart  0-15 Units Subcutaneous TID WC  . lidocaine (PF)      . memantine  10 mg Oral BID  . [START ON 04/27/2015] pneumococcal 23 valent vaccine  0.5 mL Intramuscular Tomorrow-1000   Assessment: 74 yo male with possible CAP on levaquin and patient noted with possible empyema/lung abscess. Pharmacy has been consulted to dose vancomycin. WBC= 15.2, afebrile, SCr= 0.69 and CrCl ~ 90.    -Levaquin stopped today as it was ordered x3 doses (last dose was 6/6 at about 9am). Spoke to Donnamarie PoagK. Kirby NP and will continue Levaquin as IV.   6/6 vanc>> 6/4 levaquin>>   Goal of Therapy:  Vancomycin trough level 15-20 mcg/ml  Plan:  -Vancomycin 1500mg  IV x1 followed by 750mg  IV q8h -Continue levaquin as 750mg  IV q24hr (next dose  6/7) -Will follow renal function and clinical progress  Harland Germanndrew Tatayana Beshears, Pharm D 04/26/2015 8:05 PM

## 2015-04-27 ENCOUNTER — Ambulatory Visit (HOSPITAL_COMMUNITY): Payer: Medicare Other

## 2015-04-27 DIAGNOSIS — R1110 Vomiting, unspecified: Secondary | ICD-10-CM

## 2015-04-27 DIAGNOSIS — J90 Pleural effusion, not elsewhere classified: Secondary | ICD-10-CM

## 2015-04-27 DIAGNOSIS — J869 Pyothorax without fistula: Secondary | ICD-10-CM

## 2015-04-27 LAB — COMPREHENSIVE METABOLIC PANEL
ALT: 50 U/L (ref 17–63)
AST: 32 U/L (ref 15–41)
Albumin: 2.1 g/dL — ABNORMAL LOW (ref 3.5–5.0)
Alkaline Phosphatase: 128 U/L — ABNORMAL HIGH (ref 38–126)
Anion gap: 8 (ref 5–15)
BILIRUBIN TOTAL: 0.8 mg/dL (ref 0.3–1.2)
BUN: 15 mg/dL (ref 6–20)
CO2: 23 mmol/L (ref 22–32)
CREATININE: 0.76 mg/dL (ref 0.61–1.24)
Calcium: 8.3 mg/dL — ABNORMAL LOW (ref 8.9–10.3)
Chloride: 103 mmol/L (ref 101–111)
GFR calc Af Amer: >60 mL/min (ref >60)
GFR calc non Af Amer: >60 mL/min (ref >60)
Glucose, Bld: 138 mg/dL — ABNORMAL HIGH (ref 65–99)
Potassium: 4.1 mmol/L (ref 3.5–5.1)
Sodium: 134 mmol/L — ABNORMAL LOW (ref 135–145)
TOTAL PROTEIN: 6 g/dL — AB (ref 6.5–8.1)

## 2015-04-27 LAB — MAGNESIUM: MAGNESIUM: 2 mg/dL (ref 1.7–2.4)

## 2015-04-27 LAB — CBG MONITORING, ED: Glucose-Capillary: 28 mg/dL — CL (ref 65–99)

## 2015-04-27 LAB — GLUCOSE, CAPILLARY
GLUCOSE-CAPILLARY: 135 mg/dL — AB (ref 65–99)
Glucose-Capillary: 44 mg/dL — CL (ref 65–99)
Glucose-Capillary: 118 mg/dL — ABNORMAL HIGH (ref 65–99)
Glucose-Capillary: 156 mg/dL — ABNORMAL HIGH (ref 65–99)
Glucose-Capillary: 145 mg/dL — ABNORMAL HIGH (ref 65–99)

## 2015-04-27 LAB — CBC
HCT: 34.6 % — ABNORMAL LOW (ref 39.0–52.0)
Hemoglobin: 12.5 g/dL — ABNORMAL LOW (ref 13.0–17.0)
MCH: 29.1 pg (ref 26.0–34.0)
MCHC: 36.1 g/dL — AB (ref 30.0–36.0)
MCV: 80.7 fL (ref 78.0–100.0)
PLATELETS: 308 10*3/uL (ref 150–400)
RBC: 4.29 MIL/uL (ref 4.22–5.81)
RDW: 14.4 % (ref 11.5–15.5)
WBC: 13.8 10*3/uL — AB (ref 4.0–10.5)

## 2015-04-27 NOTE — Progress Notes (Signed)
[place] TEAM 1 - Stepdown/ICU TEAM Progress Note  Gary Mcneil:[id number] DOB: 04/30/1941 DOA: 04/24/2015 PCP: [name], NP  Admit HPI / Brief Narrative: Gary Mcneil is a 74 y.o. BM PMHx diabetes, mild dementia, HTN, HLD Dx on 5/31 with a right lung mass. He was found to have a CBG of 13 on 6/3.   Per his son, Gary Mcneil, the patient was recently taken off of metformin and glipizide. The pharmacy refilled his medications and included glipizide and metformin as well. The son placed these medications in the patient's med box, and consequently the patient took them. The son reports that as the patient has developed dementia he frequently takes extra pills. The son noticed on the evening of 6/2 that the patient was lethargic. He took the patient to his home fed him and kept him there to check CBGs for 24 hours. On the evening of 6/3, the patient was taken back to his home fed and put to bed. His daughter-in-law checked on him later that evening and the patient could not wake up to answer the door. EMS found his blood sugar to be 13, and the patient was brought in for treatment. Reportedly Gary Mcneil last took glipizide and metformin on the evening of 6/2, yet this morning (6/4) after multiple doses of D50 and a continuous D10 infusion, the patient's CBGs continue to drop. He has been admitted to stepdown.  In the ER: Chest x-ray shows a large mass in the right lung and right pleural effusion. Potassium is 3.4, white count is 20.4. Hemoglobin has dropped from 13.5-10.8 with dilution.  HPI/Subjective: 6/7 A/O 4, very pleasant very intelligent gentleman Gary Mcneil) who was concerned about the right lung mass. Patient counseled today that the reason there was no biopsy was repeat CT showed initial reported mass to be most consistent with loculated effusion. Per Dr. Vassie Loll [facility] M) IR to perform thoracentesis in the a.m.  Assessment/Plan: Diabetes type 2 controlled -5/31 hemoglobin A1c=  6.5 -Obtain lipid panel  Hypoglycemia/Hyperglycemia -Resolved  -Patient CBGs now running high -Start moderate SSI  Right Lung loculated effusion -Large loculated effusion, IR thoracentesis on 6/7  -Patient was discharged on 6/1 on Levaquin for PNA. Will complete the antibiotic course with three more days of oral levaquin. -Dr Ardelle Anton- to perform imaging guided thoracentesis for diagnostic purposes  a.m.  Severe malnutrition -Secondary to unintentional weight loss. -Continue ensure bid and encourage snacks.  Pain -Restart PRN morphine  Nausea and vomiting refractory to Zofran -When necessary Thorazine 12.5 mg TID PRN    Code Status: FULL Family Communication: no family present at time of exam Disposition Plan: Diagnosis of right pleural effusion    Consultants: Dr.Kevin Allred (IR)  Dr.Rakesh Vassie Loll [facility] M)   Procedure/Significant Events: 5/31 CT angiogram chest PE protocol;Negative PE  - Masslike consolidation extending from the right lung apex inferiorly along the mediastinum to the hilum. Concerning for primary bronchogenic carcinoma. -1 cm sclerotic focus in the T8 vertebral body possible osseous metastatic disease. - Mildly enlarged low right paratracheal lymph nodel; metastatic Dz  vs Reactive adenopathy? -Small to moderate loculated right pleural effusion is presumed malignant or parapneumonic. 6/6 CT chest without contrast; Rt paratracheal structure resolved and likely represented loculated pleural fluid. -Continues to be complex pleural fluid along the lateral right chest with new gas collections in the right lower lobe. Necrotic lung vs empyema vs lung abscess   Culture   Antibiotics: Levofloxacin 6/1>>  DVT prophylaxis: Subcutaneous heparin; discontinued admin night.  Restart after biopsy   Devices   LINES / TUBES:      Continuous Infusions: . sodium chloride 50 mL/hr at 04/26/15 2042    Objective: VITAL SIGNS: Temp: 97.9 F (36.6 C)  (06/07 0757) Temp Source: Oral (06/07 0757) BP: 152/75 mmHg (06/07 0700) Pulse Rate: 80 (06/07 0700) SPO2; FIO2:   Intake/Output Summary (Last 24 hours) at 04/27/15 0806 Last data filed at 04/27/15 0700  Gross per 24 hour  Intake 1962.5 ml  Output    900 ml  Net 1062.5 ml     Exam: General: A/O 4, NAD, No acute respiratory distress Eyes: Negative headache, eye pain, double vision, retinal hemorrhage ENT: Negative Runny nose,  Neck:  Negative scars, masses, torticollis, lymphadenopathy, JVD Lungs: Clear to auscultation bilaterally, except for decreased RLL breath sounds, without wheezes or crackles Cardiovascular: Regular rate and rhythm without murmur gallop or rub normal S1 and S2 Abdomen:negative abdominal pain, negative dysphagia, Nontender, nondistended, soft, bowel sounds positive, no rebound, no ascites, no appreciable mass Extremities: No significant cyanosis, clubbing, or edema bilateral lower extremities Psychiatric:  Negative depression, negative anxiety, negative fatigue, negative mania  Neurologic:  Cranial nerves II through XII intact, tongue/uvula midline, all extremities muscle strength 5/5, sensation intact throughout, negative dysarthria, negative expressive aphasia, negative receptive aphasia.      Data Reviewed: Basic Metabolic Panel:  Recent Labs Lab 04/24/15 0340 04/24/15 0810 04/25/15 0230 04/25/15 1830 04/26/15 0259 04/27/15 0304  NA 138  --  127* 131* 136 134*  K 3.4*  --  4.4 3.9 4.2 4.1  CL 106  --  95* 97* 104 103  CO2 26  --  21* GLUCOSE 88  --  200* 267* 134* 138*  BUN 17  --  CREATININE 0.66 0.56* 0.60* 0.93 0.69 0.76  CALCIUM 8.1*  --  8.2* 8.7* 8.5* 8.3*  MG  --   --   --  2.2 2.0 2.0   Liver Function Tests:  Recent Labs Lab 04/21/15 0409 04/25/15 0230 04/25/15 1830 04/26/15 0259 04/27/15 0304  AST 40 67* 73* 44* 32  ALT 50 57 68* 63 50  ALKPHOS 101 150* 156* 143* 128*  BILITOT 2.6* 2.3* 1.0 1.1  0.8  PROT 6.1* 6.2* 7.0 6.5 6.0*  ALBUMIN 2.6* 2.2* 2.3* 2.3* 2.1*   No results for input(s): LIPASE, AMYLASE in the last 168 hours. No results for input(s): AMMONIA in the last 168 hours. CBC:  Recent Labs Lab 04/24/15 0340 04/24/15 0810 04/25/15 0230 04/25/15 1830 04/26/15 0259 04/27/15 0304  WBC 14.6* 14.7* 17.5* 16.0* 15.2* 13.8*  NEUTROABS 12.6*  --   --  13.0* 12.0*  --   HGB 10.8* 11.4* 12.7* 13.5 13.3 12.5*  HCT 29.9* 30.9* 34.4* 36.7* 36.0* 34.6*  MCV 82.6 81.3 80.9 80.1 80.5 80.7  PLT 222 220 250 306 284 308   Cardiac Enzymes: No results for input(s): CKTOTAL, CKMB, CKMBINDEX, TROPONINI in the last 168 hours. BNP (last 3 results)  Recent Labs  04/20/15 0524  BNP 66.8    ProBNP (last 3 results) No results for input(s): PROBNP in the last 8760 hours.  CBG:  Recent Labs Lab 04/26/15 0400 04/26/15 0757 04/26/15 1136 04/26/15 1600 04/26/15 2023  GLUCAP 133* 109* 139* 267* 123*    Recent Results (from the past 240 hour(s))  Culture, blood (routine x 2)     Status: None   Collection Time: 04/20/15  8:40 AM  Result Value Ref  Range Status   Specimen Description BLOOD ARM RIGHT  Final   Special Requests BOTTLES DRAWN AEROBIC AND ANAEROBIC 5CC  Final   Culture   Final    NO GROWTH 5 DAYS [facility]    Report Status 04/26/2015 FINAL  Final  Culture, blood (routine x 2)     Status: None   Collection Time: 04/20/15  8:48 AM  Result Value Ref Range Status   Specimen Description BLOOD RIGHT FOREARM  Final   Special Requests BOTTLES DRAWN AEROBIC AND ANAEROBIC 5CC  Final   Culture   Final    NO GROWTH 5 DAYS [facility]formed at [facility]    Report Status 04/26/2015 FINAL  Final  MRSA PCR Screening     Status: None   Collection Time: 04/24/15  6:50 AM  Result Value Ref Range Status   MRSA by PCR NEGATIVE NEGATIVE Final    Comment:        The GeneXpert MRSA Assay (FDA approved for NASAL specimens only), is one component of  a comprehensive MRSA colonization surveillance program. It is not intended to diagnose MRSA infection nor to guide or monitor treatment for MRSA infections.      Studies:  Recent x-ray studies have been reviewed in detail by the Attending Physician  Scheduled Meds:  Scheduled Meds: . donepezil  5 mg Oral QHS  . feeding supplement (ENSURE ENLIVE)  237 mL Oral BID BM  . insulin aspart  0-15 Units Subcutaneous TID WC  . levofloxacin (LEVAQUIN) IV  750 mg Intravenous Q24H  . memantine  10 mg Oral BID  . pneumococcal 23 valent vaccine  0.5 mL Intramuscular Tomorrow-1000  . vancomycin  750 mg Intravenous Q8H    Time spent on care of this patient: 40 mins   WOODS, Roselind MessierURTIS J , MD  [facility] Office  [phone] Pager [phone]  On-Call/Text Page:      [email or web address]      password [email or web address]  If 7PM-7AM, please contact night-coverage [email or web address] Password [email or web address] 04/27/2015, 8:06 AM   LOS: 3 days   Care during the described time interval was provided by me .  I have reviewed this patient's available data, including medical history, events of note, physical examination, and all test results as part of my evaluation. I have personally reviewed and interpreted all radiology studies.   Carolyne Littlesurtis Woods, MD [phone] Pager

## 2015-04-27 NOTE — Consult Note (Signed)
[address]       [place] [address]             [phone]        WHITMAN MEINHARDT [facility] Medical Record #[id number] Date of Birth: [birth year]  Referring: No ref. provider found Primary Care: [name], NP  Chief Complaint:    Chief Complaint  Patient presents with  . Hypoglycemia   patient examined, CT scans of the chest personally reviewed  History of Present Illness:     74 year old AA male nonsmoker presents with right pleuritic chest pain, weight loss or malaise and progressive weakness. CT scan of chest ruled out pulmonary embolus but showed a loculated right pleural effusion and probable infiltrate right lower lobe as well as some fullness of the right mediastinal soft tissue at the carina. No significant fever but he did have a white count elevation on presentation--20 K leukocytosis. He has been placed on anti-biotics. He is maintained sinus rhythm and normal blood pressures. Subsequent scans that shown resolution of the peritracheal fullness but persistent loculated fluid posteriorly in the right pleural space. His appetite has improved. His pain has improved. Because of the concern of a empyema a thoracic surgical evaluation was requested. The patient is scheduled to have a thoracentesis-aspirate of the loculated right pleural effusion to help direct therapy--either VATS drainage and decortication versus continuing further course of anti-biotics   Current Activity/ Functional Status: Patient is able to perform ADLs   Zubrod Score: At the time of surgery this patient's most appropriate activity status/level should be described as:     0    Normal activity, no symptoms     1    Restricted in physical strenuous activity but ambulatory, able to do out light work     2    Ambulatory and capable of self care, unable to do work activities, up and about                 more than 50%  Of the time                                3    Only  limited self care, in bed greater than 50% of waking hours     4    Completely disabled, no self care, confined to bed or chair     5    Moribund  Past Medical History  Diagnosis Date  . Type II or unspecified type diabetes mellitus without mention of complication, uncontrolled   . DEMENTIA   . Neuromuscular disorder   . Hypertension   . Gout   . Arthritis   . Dementia 04/24/2012  . Unspecified hereditary and idiopathic peripheral neuropathy   . Anemia, unspecified   . Hypotension, unspecified   . Acute kidney failure, unspecified   . Unspecified vitamin D deficiency   . Other and unspecified hyperlipidemia   . Hypopotassemia   . Disorder of bone and cartilage, unspecified   . Memory loss   . Hypotension, unspecified   . Acute kidney failure, unspecified   . Muscle weakness (generalized)   . Dizziness and giddiness   . Myalgia and myositis, unspecified   . Screening for ischemic heart disease   . Type II or unspecified type diabetes mellitus without mention of complication, uncontrolled   . Gout, unspecified   . Tobacco use disorder   .  Memory loss   . Special screening for malignant neoplasm of prostate   . GERD (gastroesophageal reflux disease)     Past Surgical History  Procedure Laterality Date  . Inguinal hernia repair      History  Smoking status  . Former Smoker  . Quit date: 11/20/1969  Smokeless tobacco  . Never Used    History  Alcohol Use No    History   Social History  . Marital Status: Single    Spouse Name: N/A  . Number of Children: 3  . Years of Education: N/A   Occupational History  . Retired Charity fundraiser from [employer]   . Retired Education officer, environmental    Social History Main Topics  . Smoking status: Former Smoker    Quit date: 11/20/1969  . Smokeless tobacco: Never Used  . Alcohol Use: No  . Drug Use: No  . Sexual Activity: No   Other Topics Concern  . Not on file   Social History Narrative   Bachelors in Nurse, adult in [place]     Attended [employer] and [employer]             Allergies  Allergen Reactions  . Penicillins Rash    Current Facility-Administered Medications  Medication Dose Route Frequency Provider Last Rate Last Dose  . acetaminophen (TYLENOL) tablet 650 mg  650 mg Oral Q6H PRN Stephani Police, PA-C   650 mg at 04/27/15 1622   Or  . acetaminophen (TYLENOL) suppository 650 mg  650 mg Rectal Q6H PRN Stephani Police, PA-C      . alum & mag hydroxide-simeth (MAALOX/MYLANTA) 200-200-20 MG/5ML suspension 30 mL  30 mL Oral Q6H PRN Stephani Police, PA-C   30 mL at 04/25/15 0605  . dextrose 50 % solution 25 mL  25 mL Intravenous PRN Stephani Police, PA-C      . donepezil (ARICEPT) tablet 5 mg  5 mg Oral QHS Stephani Police, PA-C   5 mg at 04/26/15 2208  . feeding supplement (ENSURE ENLIVE) (ENSURE ENLIVE) liquid 237 mL  237 mL Oral BID BM Marianne L York, PA-C   237 mL at 04/27/15 1545  . hydrALAZINE (APRESOLINE) injection 10 mg  10 mg Intravenous Q6H PRN Stephani Police, PA-C   10 mg at 04/25/15 0220  . insulin aspart (novoLOG) injection 0-15 Units  0-15 Units Subcutaneous TID WC Lonia Blood, MD   2 Units at 04/27/15 1130  . levofloxacin (LEVAQUIN) IVPB 750 mg  750 mg Intravenous Q24H Silvana Newness, RPH   750 mg at 04/27/15 1610  . memantine (NAMENDA) tablet 10 mg  10 mg Oral BID Stephani Police, PA-C   10 mg at 04/27/15 1200  . morphine 2 MG/ML injection 2 mg  2 mg Intravenous Q3H PRN Lonia Blood, MD   2 mg at 04/27/15 0316  . ondansetron (ZOFRAN) tablet 4 mg  4 mg Oral Q6H PRN Stephani Police, PA-C   4 mg at 04/24/15 1810   Or  . ondansetron (ZOFRAN) injection 4 mg  4 mg Intravenous Q6H PRN Stephani Police, PA-C   4 mg at 04/26/15 0252  . traMADol (ULTRAM) tablet 50 mg  50 mg Oral Q6H PRN Stephani Police, PA-C   50 mg at 04/27/15 0416  . traZODone (DESYREL) tablet 50 mg  50 mg Oral QHS PRN Stephani Police, PA-C   50 mg at 04/26/15 2208  . vancomycin (VANCOCIN) IVPB 750 mg/150 ml  premix   750 mg Intravenous Q8H Silvana Newness, RPH   750 mg at 04/27/15 1210    Prescriptions prior to admission  Medication Sig Dispense Refill Last Dose  . allopurinol (ZYLOPRIM) 100 MG tablet Take 2 tablets (200 mg total) by mouth daily. 60 tablet 3 04/24/2015 at Unknown time  . donepezil (ARICEPT) 10 MG tablet Start 5 mg (1/2 tablet) daily at bedtime for 1 month then increase to 10 mg (1 tablet) daily at bedtime for memory (Patient taking differently: Take 5 mg by mouth at bedtime. ) 30 tablet 4 04/23/2015 at Unknown time  . glipiZIDE (GLUCOTROL XL) 5 MG 24 hr tablet Take 5 mg by mouth daily.  2 04/24/2015 at Unknown time  . hydrochlorothiazide (HYDRODIURIL) 25 MG tablet TAKE 1 TABLET BY MOUTH DAILY 30 tablet 5 04/24/2015 at Unknown time  . memantine (NAMENDA) 10 MG tablet TAKE 1 TABLET BY MOUTH TWICE DAILY 60 tablet 2 04/24/2015 at Unknown time  . Multiple Vitamin (MULTIVITAMIN WITH MINERALS) TABS tablet Take 1 tablet by mouth daily.   04/24/2015 at Unknown time  . potassium chloride SA (K-DUR,KLOR-CON) 20 MEQ tablet TAKE 2 TABLETS BY MOUTH TWICE DAILY 120 tablet 3 04/24/2015 at Unknown time  . traMADol (ULTRAM) 50 MG tablet TAKE 1-2 TABLETS BY MOUTH EVERY 8 HOURS AS NEEDED (Patient taking differently: TAKE 1-2 TABLETS BY MOUTH EVERY 8 HOURS AS NEEDED FOR PAIN.) 120 tablet 0 04/23/2015 at Unknown time  . levofloxacin (LEVAQUIN) 500 MG tablet Take 1 tablet (500 mg total) by mouth daily. 5 tablet 0 never  . [DISCONTINUED] gabapentin (NEURONTIN) 300 MG capsule Take 1 capsule (300 mg total) by mouth 3 (three) times daily. 60 capsule 1 04/21/2015 at Unknown time  . [DISCONTINUED] glipiZIDE (GLUCOTROL XL) 5 MG 24 hr tablet TAKE 1 TABLET BY MOUTH DAILY TO CONTROL DIABETES 30 tablet 2 04/21/2015 at Unknown time  . [DISCONTINUED] metFORMIN (GLUCOPHAGE) 500 MG tablet TAKE 1 TABLET BY MOUTH DAILY 30 tablet 3 04/21/2015 at Unknown time    Family History  Problem Relation Age of Onset  . Diabetes Mother   . Hypertension Mother   .  Diabetes Father   . Hypertension Father   . Kidney disease Father   . Diabetes Sister   . Kidney disease Brother      Review of Systems:       Cardiac Review of Systems: Y or N  Chest Pain [ yes   ]  Resting SOB [ no  ] Exertional SOB  [ yes ]  Orthopnea Mahler.Beck  ]   Pedal Edema [ no]    Palpitations [ no ] Syncope  [ no ]   Presyncope [ no  ]  General Review of Systems: [Y] = yes [  ]=no Constitional: recent weight change Mahler.Beck  ]; anorexia [ yes ]; fatigue Mahler.Beck  ]; nausea [ no ]; night sweats [no  ]; fever [ no ]; or chills [no  ]                                                               Dental: poor dentition[  ]; Last Dentist visit:   Eye : blurred vision [  ]; diplopia [   ]; vision changes [  ];  Amaurosis fugax[  ];  Resp: cough [  ];  wheezing[  ];  hemoptysis[  ]; shortness of breath[  ]; paroxysmal nocturnal dyspnea[  ]; dyspnea on exertion[  ]; or orthopnea[  ];  GI:  gallstones[  ], vomiting[  ];  dysphagia[  ]; melena[  ];  hematochezia [  ]; heartburn[  ];   Hx of  Colonoscopy[  ]; GU: kidney stones [  ]; hematuria[  ];   dysuria [  ];  nocturia[  ];  history of     obstruction [  ]; urinary frequency [  ]             Skin: rash, swelling[  ];, hair loss[  ];  peripheral edema[  ];  or itching[  ]; Musculosketetal: myalgias[  ];  joint swelling[  ];  joint erythema[  ];  joint pain[  ];  back pain[  ];  Heme/Lymph: bruising[  ];  bleeding[  ];  anemia[  ];  Neuro: TIA[  ];  headaches[  ];  stroke[  ];  vertigo[  ];  seizures[  ];   paresthesias[  ];  difficulty walking[  ];  Psych:depression[  ]; anxiety[  ];  Endocrine: diabetes[ yes ];  thyroid dysfunction[  ];  Immunizations: Flu [  ]; Pneumococcal[  ];  Other: Right hand dominant, no history of thoracic trauma    Physical Exam: BP 166/91 mmHg  Pulse 102  Temp(Src) 98.4 F (36.9 C) (Oral)  Resp 25  Ht 6' (1.829 m)  Wt 167 lb 8.8 oz (76 kg)  BMI 22.72 kg/m2  SpO2 98%       Physical Exam  General: Pleasant  elderly  AA male in the ICU in no distress  HEENT: Normocephalic pupils equal , dentition poor with loose right upper tooth  Neck: Supple without JVD, adenopathy, or bruit Chest:  bronchial breath sounds on right  on auscultation, asymmetrical breath sounds, no rhonchi, no tenderness             or deformity Cardiovascular: Regular rate and rhythm, no murmur, no gallop, peripheral pulses             palpable in all extremities Abdomen:  Soft, nontender, no palpable mass or organomegaly Extremities: Warm, well-perfused, no clubbing cyanosis edema or tenderness,              no venous stasis changes of the legs Rectal/GU: Deferred Neuro: Grossly non--focal and symmetrical throughout Skin: Clean and dry without rash or ulceration   Diagnostic Studies & Laboratory data:     Recent Radiology Findings:   Ct Chest Wo Contrast  04/26/2015   CLINICAL DATA:  74 year old with a suspicious right paratracheal lung lesion and scheduled for CT-guided lung biopsy.  EXAM: CT CHEST WITHOUT CONTRAST  TECHNIQUE: Multidetector CT imaging of the chest was performed following the standard protocol without IV contrast.  COMPARISON:  CTA 04/20/2015  FINDINGS: Informed consent was obtained for a CT-guided lung biopsy. The patient was placed prone and CT images were obtained of the chest. The right paratracheal collection is no longer present and this likely represented loculated pleural fluid. There continues to be complex or loculated pleural fluid along the lateral right chest. The amount of pleural fluid in the right lateral chest has slightly increased. Overall, there does not appear to be significant mediastinal lymphadenopathy on this non contrast exam.  Again noted are emphysematous changes. There is volume loss and consolidation in the right lower lobe and there is  now a air-fluid collection in the right lower lobe near the loculated pleural fluid. This could represent a focus of necrotic lung.  No acute bone  abnormalities.  IMPRESSION: The right paratracheal structure has resolved and likely represented loculated pleural fluid.  There continues to be complex pleural fluid along the lateral right chest with new gas collections in the right lower lobe. The gas collections could represent necrotic lung. Findings are concerning for empyema and possible lung abscess. These structures could be better characterized with a post contrast CT.  These results were called by telephone at the time of interpretation on 04/26/2015 at 3:03 pm to Dr. [name], who verbally acknowledged these results.   Electronically Signed   By: Richarda Overlie M.D.   On: 04/26/2015 15:07     I have independently reviewed the above radiologic studies.  Recent Lab Findings: Lab Results  Component Value Date   WBC 13.8* 04/27/2015   HGB 12.5* 04/27/2015   HCT 34.6* 04/27/2015   PLT 308 04/27/2015   GLUCOSE 138* 04/27/2015   CHOL 161 04/26/2015   TRIG 101 04/26/2015   HDL 30* 04/26/2015   LDLCALC 111* 04/26/2015   ALT 50 04/27/2015   AST 32 04/27/2015   NA 134* 04/27/2015   K 4.1 04/27/2015   CL 103 04/27/2015   CREATININE 0.76 04/27/2015   BUN 15 04/27/2015   CO2 23 04/27/2015   TSH 2.230 08/12/2014   INR 1.26 04/26/2015   HGBA1C 6.5* 04/20/2015      Assessment / Plan:     Agree with plans for thoracentesis and to assess nature of pleural fluid. 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 We'll follow.       @ 04/27/2015 4:47 PM

## 2015-04-27 NOTE — Consult Note (Signed)
Name: Gary Mcneil MRN: [id number] DOB: February 06, 1941    ADMISSION DATE:  04/24/2015 CONSULTATION DATE:  04/27/2015  REFERRING MD :  Joseph Art  CHIEF COMPLAINT:  Hypoglycemia, chest pain  HISTORY OF PRESENT ILLNESS:  74 year old remote smoker admitted 5/31 -6/1 for right-sided pleuritic chest pain , CT  chest showed right parapneumonic effusion and right paratracheal mass versus fluid .he was treated for pneumonia with Rocephin and azithromycin and discharged on Levaquin . he was readmitted on 6/3 with a hypoglycemic episode .he had apparently continue to take metformin and glipizide which she was asked to stop .repeat chest x-ray showed persistent right effusion . He was taken to IR for a scheduled biopsy -CT chest then showed that the paratracheal mass had resolved -this was likely pleural fluid collection with the loculation around the lateral chest wall now had air bubbles in it . Hence we're consulted . His chest pain is resolved , he denies any dyspnea , cough or sputum production . He has been afebrile since admission. WBC count over the last few days peaked at 20 K on 6/2 and is now down to 14 K   SIGNIFICANT EVENT  STUDIES:  6/6 CT chest -right paratracheal loculation has resolved.Right lateral loculation has new gas collections     PAST MEDICAL HISTORY :   has a past medical history of Type II or unspecified type diabetes mellitus without mention of complication, uncontrolled; DEMENTIA; Neuromuscular disorder; Hypertension; Gout; Arthritis; Dementia (04/24/2012); Unspecified hereditary and idiopathic peripheral neuropathy; Anemia, unspecified; Hypotension, unspecified; Acute kidney failure, unspecified; Unspecified vitamin D deficiency; Other and unspecified hyperlipidemia; Hypopotassemia; Disorder of bone and cartilage, unspecified; Memory loss; Hypotension, unspecified; Acute kidney failure, unspecified; Muscle weakness (generalized); Dizziness and giddiness; Myalgia and myositis,  unspecified; Screening for ischemic heart disease; Type II or unspecified type diabetes mellitus without mention of complication, uncontrolled; Gout, unspecified; Tobacco use disorder; Memory loss; Special screening for malignant neoplasm of prostate; and GERD (gastroesophageal reflux disease).  has past surgical history that includes Inguinal hernia repair.    Prior to Admission medications   Medication Sig Start Date End Date Taking? Authorizing Provider  allopurinol (ZYLOPRIM) 100 MG tablet Take 2 tablets (200 mg total) by mouth daily. 02/25/15  Yes [name], NP  donepezil (ARICEPT) 10 MG tablet Start 5 mg (1/2 tablet) daily at bedtime for 1 month then increase to 10 mg (1 tablet) daily at bedtime for memory Patient taking differently: Take 5 mg by mouth at bedtime.  02/25/15  Yes [name], NP  glipiZIDE (GLUCOTROL XL) 5 MG 24 hr tablet Take 5 mg by mouth daily. 04/18/15  Yes Historical Provider, MD  hydrochlorothiazide (HYDRODIURIL) 25 MG tablet TAKE 1 TABLET BY MOUTH DAILY 03/08/15  Yes [name], NP  memantine (NAMENDA) 10 MG tablet TAKE 1 TABLET BY MOUTH TWICE DAILY 03/15/15  Yes [name], NP  Multiple Vitamin (MULTIVITAMIN WITH MINERALS) TABS tablet Take 1 tablet by mouth daily.   Yes Historical Provider, MD  potassium chloride SA (K-DUR,KLOR-CON) 20 MEQ tablet TAKE 2 TABLETS BY MOUTH TWICE DAILY 01/04/15  Yes [name], NP  traMADol (ULTRAM) 50 MG tablet TAKE 1-2 TABLETS BY MOUTH EVERY 8 HOURS AS NEEDED Patient taking differently: TAKE 1-2 TABLETS BY MOUTH EVERY 8 HOURS AS NEEDED FOR PAIN. 03/29/15  Yes Tiffany L Reed, DO  levofloxacin (LEVAQUIN) 500 MG tablet Take 1 tablet (500 mg total) by mouth daily. 04/21/15   Costin Otelia Sergeant, MD   Allergies  Allergen Reactions  . Penicillins Rash    FAMILY HISTORY:  family history includes Diabetes in his father, mother, and sister; Hypertension in his father and mother; Kidney disease in his brother and  father. SOCIAL HISTORY:  reports that he quit smoking about 45 years ago. He has never used smokeless tobacco. He reports that he does not drink alcohol or use illicit drugs.  REVIEW OF SYSTEMS:   Constitutional: Negative for fever, chills, weight loss, malaise/fatigue and diaphoresis.  HENT: Negative for hearing loss, ear pain, nosebleeds, congestion, sore throat, neck pain, tinnitus and ear discharge.   Eyes: Negative for blurred vision, double vision, photophobia, pain, discharge and redness.  Respiratory: Negative for cough, hemoptysis, sputum production, shortness of breath, wheezing and stridor.   Cardiovascular: Negative for chest pain, palpitations, orthopnea, claudication, leg swelling and PND.  Gastrointestinal: Negative for heartburn, nausea, vomiting, abdominal pain, diarrhea, constipation, blood in stool and melena.  Genitourinary: Negative for dysuria, urgency, frequency, hematuria and flank pain.  Musculoskeletal: Negative for myalgias, back pain, joint pain and falls.  Skin: Negative for itching and rash.  Neurological: Negative for dizziness, tingling, tremors, sensory change, speech change, focal weakness, seizures, loss of consciousness, weakness and headaches.  Endo/Heme/Allergies: Negative for environmental allergies and polydipsia. Does not bruise/bleed easily.  SUBJECTIVE:  denies chest pain or dyspnea  VITAL SIGNS: Temp:  [97.9 F (36.6 C)-99.5 F (37.5 C)] 98.3 F (36.8 C) (06/07 1124) Pulse Rate:  [76-115] 106 (06/07 1100) Resp:  [18-31] 27 (06/07 1100) BP: (95-168)/(67-98) 136/67 mmHg (06/07 1100) SpO2:  [95 %-98 %] 98 % (06/07 1100) Weight:  [76 kg (167 lb 8.8 oz)] 76 kg (167 lb 8.8 oz) (06/07 0400)  PHYSICAL EXAMINATION: Gen. Pleasant, well-nourished, in no distress, normal affect ENT - no lesions, no post nasal drip Neck: No JVD, no thyromegaly, no carotid bruits Lungs: no use of accessory muscles, no dullness to percussion, decreased right base without  rales or rhonchi  Cardiovascular: Rhythm regular, heart sounds  normal, no murmurs, no peripheral edema Abdomen: soft and non-tender, no hepatosplenomegaly, BS normal. Musculoskeletal: No deformities, no cyanosis or clubbing Neuro:  alert, non focal Skin:  Warm, no lesions/ rash    Recent Labs Lab 04/25/15 1830 04/26/15 0259 04/27/15 0304  NA 131* 136 134*  K 3.9 4.2 4.1  CL 97* 104 103  CO2 22 23 23   BUN 14 11 15   CREATININE 0.93 0.69 0.76  GLUCOSE 267* 134* 138*    Recent Labs Lab 04/25/15 1830 04/26/15 0259 04/27/15 0304  HGB 13.5 13.3 12.5*  HCT 36.7* 36.0* 34.6*  WBC 16.0* 15.2* 13.8*  PLT 306 284 308   Ct Chest Wo Contrast  04/26/2015   CLINICAL DATA:  74 year old with a suspicious right paratracheal lung lesion and scheduled for CT-guided lung biopsy.  EXAM: CT CHEST WITHOUT CONTRAST  TECHNIQUE: Multidetector CT imaging of the chest was performed following the standard protocol without IV contrast.  COMPARISON:  CTA 04/20/2015  FINDINGS: Informed consent was obtained for a CT-guided lung biopsy. The patient was placed prone and CT images were obtained of the chest. The right paratracheal collection is no longer present and this likely represented loculated pleural fluid. There continues to be complex or loculated pleural fluid along the lateral right chest. The amount of pleural fluid in the right lateral chest has slightly increased. Overall, there does not appear to be significant mediastinal lymphadenopathy on this non contrast exam.  Again noted are emphysematous changes. There is volume loss and consolidation  in the right lower lobe and there is now a air-fluid collection in the right lower lobe near the loculated pleural fluid. This could represent a focus of necrotic lung.  No acute bone abnormalities.  IMPRESSION: The right paratracheal structure has resolved and likely represented loculated pleural fluid.  There continues to be complex pleural fluid along the lateral  right chest with new gas collections in the right lower lobe. The gas collections could represent necrotic lung. Findings are concerning for empyema and possible lung abscess. These structures could be better characterized with a post contrast CT.  These results were called by telephone at the time of interpretation on 04/26/2015 at 3:03 pm to Dr. [name], who verbally acknowledged these results.   Electronically Signed   By: Richarda Overlie M.D.   On: 04/26/2015 15:07    ASSESSMENT / PLAN:  Right complex lateral loculated effusion -  the paratracheal loculation noted earlier has resolved. The gas collections within this loculation raise concern for empyema. He appears to be clinically improved . I spoke to interventional radiology - dr Ardelle Anton- and requested and imaging guided thoracentesis for diagnostic purposes . if this ends up being purulent , we'll involve CT surgery . I discussed this plan with the patient and he was in agreement . He does carry a diagnosis of mild dementia -but seems to understand risks and benefits of such a procedure if required   Cyril Mourning MD. FCCP. [facility] Pager [phone] If no response call [phone]     04/27/2015, 1:59 PM

## 2015-04-28 ENCOUNTER — Inpatient Hospital Stay (HOSPITAL_COMMUNITY): Payer: Medicare Other

## 2015-04-28 ENCOUNTER — Inpatient Hospital Stay (HOSPITAL_COMMUNITY): Admission: RE | Admit: 2015-04-28 | Payer: Medicare Other | Source: Ambulatory Visit

## 2015-04-28 LAB — CBC WITH DIFFERENTIAL/PLATELET
BASOS ABS: 0 10*3/uL (ref 0.0–0.1)
Basophils Relative: 0 % (ref 0–1)
EOS ABS: 0.1 10*3/uL (ref 0.0–0.7)
Eosinophils Relative: 1 % (ref 0–5)
HCT: 35.5 % — ABNORMAL LOW (ref 39.0–52.0)
Hemoglobin: 12.9 g/dL — ABNORMAL LOW (ref 13.0–17.0)
Lymphocytes Relative: 12 % (ref 12–46)
Lymphs Abs: 1.7 10*3/uL (ref 0.7–4.0)
MCH: 29.8 pg (ref 26.0–34.0)
MCHC: 36.3 g/dL — ABNORMAL HIGH (ref 30.0–36.0)
MCV: 82 fL (ref 78.0–100.0)
MONOS PCT: 8 % (ref 3–12)
Monocytes Absolute: 1.2 10*3/uL — ABNORMAL HIGH (ref 0.1–1.0)
Neutro Abs: 11.5 10*3/uL — ABNORMAL HIGH (ref 1.7–7.7)
Neutrophils Relative %: 79 % — ABNORMAL HIGH (ref 43–77)
Platelets: 296 10*3/uL (ref 150–400)
RBC: 4.33 MIL/uL (ref 4.22–5.81)
RDW: 14.5 % (ref 11.5–15.5)
WBC: 14.5 10*3/uL — AB (ref 4.0–10.5)

## 2015-04-28 LAB — COMPREHENSIVE METABOLIC PANEL
ALBUMIN: 2.3 g/dL — AB (ref 3.5–5.0)
ALT: 42 U/L (ref 17–63)
ANION GAP: 9 (ref 5–15)
AST: 29 U/L (ref 15–41)
Alkaline Phosphatase: 121 U/L (ref 38–126)
BUN: 14 mg/dL (ref 6–20)
CALCIUM: 8.2 mg/dL — AB (ref 8.9–10.3)
CO2: 21 mmol/L — ABNORMAL LOW (ref 22–32)
Chloride: 104 mmol/L (ref 101–111)
Creatinine, Ser: 0.66 mg/dL (ref 0.61–1.24)
GFR calc Af Amer: >60 mL/min (ref >60)
GFR calc non Af Amer: >60 mL/min (ref >60)
Glucose, Bld: 119 mg/dL — ABNORMAL HIGH (ref 65–99)
Potassium: 4 mmol/L (ref 3.5–5.1)
Sodium: 134 mmol/L — ABNORMAL LOW (ref 135–145)
Total Bilirubin: 0.9 mg/dL (ref 0.3–1.2)
Total Protein: 6.4 g/dL — ABNORMAL LOW (ref 6.5–8.1)

## 2015-04-28 LAB — BODY FLUID CELL COUNT WITH DIFFERENTIAL
Eos, Fluid: 0 %
Lymphs, Fluid: 0 %
Monocyte-Macrophage-Serous Fluid: 2 % — ABNORMAL LOW (ref 50–90)
NEUTROPHIL FLUID: 98 % — AB (ref 0–25)
WBC FLUID: 34000 uL — AB (ref 0–1000)

## 2015-04-28 LAB — GLUCOSE, CAPILLARY
Glucose-Capillary: 121 mg/dL — ABNORMAL HIGH (ref 65–99)
Glucose-Capillary: 135 mg/dL — ABNORMAL HIGH (ref 65–99)
Glucose-Capillary: 158 mg/dL — ABNORMAL HIGH (ref 65–99)
Glucose-Capillary: 105 mg/dL — ABNORMAL HIGH (ref 65–99)

## 2015-04-28 LAB — LACTATE DEHYDROGENASE, PLEURAL OR PERITONEAL FLUID: LD, Fluid: 8568 U/L — ABNORMAL HIGH (ref 3–23)

## 2015-04-28 LAB — GLUCOSE, SEROUS FLUID

## 2015-04-28 LAB — MAGNESIUM: Magnesium: 1.9 mg/dL (ref 1.7–2.4)

## 2015-04-28 MED ORDER — ALLOPURINOL 100 MG PO TABS
200.0000 mg | ORAL_TABLET | Freq: Every day | ORAL | Status: DC
  Administered 2015-04-28 – 2015-05-07 (×9): 200 mg via ORAL
  Filled 2015-04-28 (×10): qty 2

## 2015-04-28 MED ORDER — HEPARIN SODIUM (PORCINE) 5000 UNIT/ML IJ SOLN
5000.0000 [IU] | Freq: Three times a day (TID) | INTRAMUSCULAR | Status: DC
  Administered 2015-04-28: 5000 [IU] via SUBCUTANEOUS
  Filled 2015-04-28 (×7): qty 1

## 2015-04-28 MED ORDER — ADULT MULTIVITAMIN W/MINERALS CH
1.0000 | ORAL_TABLET | Freq: Every day | ORAL | Status: DC
  Administered 2015-04-28 – 2015-05-07 (×9): 1 via ORAL
  Filled 2015-04-28 (×10): qty 1

## 2015-04-28 MED ORDER — LIDOCAINE HCL (PF) 1 % IJ SOLN
INTRAMUSCULAR | Status: AC
  Filled 2015-04-28: qty 5

## 2015-04-28 MED ORDER — LIDOCAINE HCL (PF) 1 % IJ SOLN
INTRAMUSCULAR | Status: AC
  Filled 2015-04-28: qty 10

## 2015-04-28 NOTE — Progress Notes (Signed)
Procedure(s) (LRB): VIDEO BRONCHOSCOPY (N/A) VIDEO ASSISTED THORACOSCOPY (VATS)/DRAIN EMPYEMA (Right) Subjective: results of   Thoracentesis of Right pleural effusion noted Will plan R VATS for drainage/decortication Fri am  Continue antibiotics Objective: Vital signs in last 24 hours: Temp:  [97.9 F (36.6 C)-98.5 F (36.9 C)] 98.2 F (36.8 C) (06/08 1629) Pulse Rate:  [79-107] 107 (06/08 1629) Cardiac Rhythm:  [-] Sinus tachycardia (06/08 0700) Resp:  [18-24] 20 (06/08 1629) BP: (122-177)/(65-97) 134/91 mmHg (06/08 1629) SpO2:  [95 %-98 %] 95 % (06/08 1629) Weight:  [160 lb (72.576 kg)-167 lb 15.9 oz (76.2 kg)] 160 lb (72.576 kg) (06/08 1629)  Hemodynamic parameters for last 24 hours:  afebrile  Intake/Output from previous day: 06/07 0701 - 06/08 0700 In: 1225 [P.O.:300; I.V.:325; IV Piggyback:600] Out: 1425 [Urine:1425] Intake/Output this shift: Total I/O In: 490 [P.O.:240; IV Piggyback:250] Out: 300 [Urine:300]    Lab Results:  Recent Labs  04/27/15 0304 04/28/15 0255  WBC 13.8* 14.5*  HGB 12.5* 12.9*  HCT 34.6* 35.5*  PLT 308 296   BMET:  Recent Labs  04/27/15 0304 04/28/15 0255  NA 134* 134*  K 4.1 4.0  CL 103 104  CO2 23 21*  GLUCOSE 138* 119*  BUN 15 14  CREATININE 0.76 0.66  CALCIUM 8.3* 8.2*    PT/INR:  Recent Labs  04/26/15 0259  LABPROT 15.9*  INR 1.26   ABG No results found for: PHART, HCO3, TCO2, ACIDBASEDEF, O2SAT CBG (last 3)   Recent Labs  04/28/15 0818 04/28/15 1151 04/28/15 1629  GLUCAP 121* 135* 158*    Assessment/Plan: S/P Procedure(s) (LRB): VIDEO BRONCHOSCOPY (N/A) VIDEO ASSISTED THORACOSCOPY (VATS)/DRAIN EMPYEMA (Right) Right VATS for empyema Fri 6-10   LOS: 4 days    Kathlee Nationseter Van Trigt III 04/28/2015

## 2015-04-28 NOTE — Clinical Social Work Note (Signed)
Clinical Social Worker has attempted to contact pt's daughter, Gary Mcneil at [phone] as requested and left a message for a returned phone call.   CSW remains available as needed.  Gary Mcneil, MSW, LCSWA [phone] 04/28/2015 10:47 AM

## 2015-04-28 NOTE — Progress Notes (Addendum)
[facility] TEAM 1 - Stepdown/ICU TEAM Progress Note  Gary Mcneil ZOX:[id number] DOB: 03-25-1941 DOA: 04/24/2015 PCP: [name], NP  Admit HPI / Brief Narrative: 74 y.o. M Hx diabetes, mild dementia, HTN, HLD, and a right lung mass diagnosed 5/31 who was found to have a CBG of 13 on 6/3.  The patient had been recently taken off metformin and glipizide but inadvertently continued to take them. On the evening of 6/3 the patient was taken to his home, fed, and put to bed. His daughter-in-law checked on him later that evening and the patient could not wake up to answer the door. EMS found his blood sugar to be 13, and the patient was brought in for treatment. After multiple doses of D50 and a continuous D10 infusion, the patient's CBGs continued to drop.   HPI/Subjective: The patient is resting comfortably in his bed.  He denies any complaints whatsoever.  He states "I feel great".  He specifically denies chest pain shortness breath fevers chills nausea vomiting or abdominal pain.  Assessment/Plan:  R Necrotic PNA +/- empyema  -IR biopsy was to be attempted 6/6 for what was originally thought to be an R hilar mass but CT noted resolution of the R paratracheal abnormality (likely a pleural fluid collection) but noted complex/loculated R lateral chest pleural effusion increased in size w/ RLL consolidation and findings worrisome for necrosis suggesting possible empyema +/- lung abscess - pt to have thoracentesis in IR today - if findings c/w empyema TCTS to perform VATS  Diabetes type 2 uncontrolled w/ refractory hypoglycemia  -5/31 hemoglobin A1c 6.5 - hypoglycemia appears to have been due to medication error - CBG currently stable   Severe malnutrition -ensure complete bid and encourage snacks  Nausea and vomiting  -resolved  Code Status: NO CODE BLUE - DNR  Family Communication: No family present at time of exam today Disposition Plan: Transfer to medical bed - thoracentesis in  radiology today - possible need for VATS depending upon results of thoracentesis - TCTS following  Consultants: IR TCTS PCCM  Procedures: None  Antibiotics: Levofloxacin 6/4 > Vanc 6/6 >  DVT prophylaxis: Subcutaneous heparin  Objective: Blood pressure 122/78, pulse 89, temperature 98 F (36.7 C), temperature source Oral, resp. rate 20, height 6' (1.829 m), weight 76.2 kg (167 lb 15.9 oz), SpO2 98 %.  Intake/Output Summary (Last 24 hours) at 04/28/15 0814 Last data filed at 04/28/15 0600  Gross per 24 hour  Intake    935 ml  Output   1425 ml  Net   -490 ml   Exam: General: No acute respiratory distress - alert / conversant Lungs: Clear to auscultation bilaterally without wheezes or crackles with exception to poor air movement in right base Cardiovascular: Regular rate and rhythm without murmur gallop or rub normal S1 and S2 Abdomen: Nontender, nondistended, soft, bowel sounds positive, no rebound, no ascites, no appreciable mass Extremities: No significant cyanosis, clubbing, or edema bilateral lower extremities  Data Reviewed: Basic Metabolic Panel:  Recent Labs Lab 04/25/15 0230 04/25/15 1830 04/26/15 0259 04/27/15 0304 04/28/15 0255  NA 127* 131* 136 134* 134*  K 4.4 3.9 4.2 4.1 4.0  CL 95* 97* 104 103 104  CO2 21* 21*  GLUCOSE 200* 267* 134* 138* 119*  BUN CREATININE 0.60* 0.93 0.69 0.76 0.66  CALCIUM 8.2* 8.7* 8.5* 8.3* 8.2*  MG  --  2.2 2.0 2.0 1.9   Liver Function Tests:  Recent Labs Lab 04/25/15 0230 04/25/15 1830 04/26/15 0259 04/27/15 0304 04/28/15 0255  AST 67* 73* 44* 32 29  ALT 57 68* 63 50 42  ALKPHOS 150* 156* 143* 128* 121  BILITOT 2.3* 1.0 1.1 0.8 0.9  PROT 6.2* 7.0 6.5 6.0* 6.4*  ALBUMIN 2.2* 2.3* 2.3* 2.1* 2.3*   CBC:  Recent Labs Lab 04/24/15 0340  04/25/15 0230 04/25/15 1830 04/26/15 0259 04/27/15 0304 04/28/15 0255  WBC 14.6*  < > 17.5* 16.0* 15.2* 13.8* 14.5*  NEUTROABS 12.6*  --   --   13.0* 12.0*  --  11.5*  HGB 10.8*  < > 12.7* 13.5 13.3 12.5* 12.9*  HCT 29.9*  < > 34.4* 36.7* 36.0* 34.6* 35.5*  MCV 82.6  < > 80.9 80.1 80.5 80.7 82.0  PLT 222  < > 250 306 284 308 296  < > = values in this interval not displayed. CBG:  Recent Labs Lab 04/26/15 2023 04/27/15 0755 04/27/15 1123 04/27/15 1743 04/27/15 2158  GLUCAP 123* 118* 135* 156* 145*    Recent Results (from the past 240 hour(s))  Culture, blood (routine x 2)     Status: None   Collection Time: 04/20/15  8:40 AM  Result Value Ref Range Status   Specimen Description BLOOD ARM RIGHT  Final   Special Requests BOTTLES DRAWN AEROBIC AND ANAEROBIC 5CC  Final   Culture   Final    NO GROWTH 5 DAYS [facility]    Report Status 04/26/2015 FINAL  Final  Culture, blood (routine x 2)     Status: None   Collection Time: 04/20/15  8:48 AM  Result Value Ref Range Status   Specimen Description BLOOD RIGHT FOREARM  Final   Special Requests BOTTLES DRAWN AEROBIC AND ANAEROBIC 5CC  Final   Culture   Final    NO GROWTH 5 DAYS [facility]formed at [facility]    Report Status 04/26/2015 FINAL  Final  MRSA PCR Screening     Status: None   Collection Time: 04/24/15  6:50 AM  Result Value Ref Range Status   MRSA by PCR NEGATIVE NEGATIVE Final    Comment:        The GeneXpert MRSA Assay (FDA approved for NASAL specimens only), is one component of a comprehensive MRSA colonization surveillance program. It is not intended to diagnose MRSA infection nor to guide or monitor treatment for MRSA infections.      Studies:  Recent x-ray studies have been reviewed in detail by the Attending Physician  Scheduled Meds:  Scheduled Meds: . donepezil  5 mg Oral QHS  . feeding supplement (ENSURE ENLIVE)  237 mL Oral BID BM  . insulin aspart  0-15 Units Subcutaneous TID WC  . levofloxacin (LEVAQUIN) IV  750 mg Intravenous Q24H  . memantine  10 mg Oral BID  . vancomycin  750 mg Intravenous Q8H     Time spent on care of this patient: 35 mins  Lonia BloodJeffrey T. Liadan Guizar, MD [facility] For Consults/Admissions - Flow Manager - [phone] Office  [phone]  Contact MD directly via text page:      [email or web address]      password [email or web address]  04/28/2015, 8:14 AM   LOS: 4 days

## 2015-04-28 NOTE — Progress Notes (Signed)
Late entry for 1630. Pt transferred from 78M to 6e19 via wheelchair. No telemetry ordered. VSS. Urinal at bedside. NSL L arm. No skin issues. Daughter at bedside. Reviewed differences in ICU versus med surg. Daughter expressed appreciation for explanation. Will continue to monitor. Bess KindsGWALTNEY, Kourosh Jablonsky B, RN

## 2015-04-28 NOTE — Progress Notes (Signed)
Pt removed from telemetry, orders placed for non-tele floor, vs per routine. Will continue to monitor closely.

## 2015-04-28 NOTE — Progress Notes (Signed)
prog   Name: Gary Mcneil MRN: [id number] DOB: 09/13/1941    ADMISSION DATE:  04/24/2015 CONSULTATION DATE:  04/28/2015  REFERRING MD :  Joseph Artwoods  CHIEF COMPLAINT:  Hypoglycemia, chest pain  HISTORY OF PRESENT ILLNESS:  74 year old remote smoker admitted 5/31 -6/1 for right-sided pleuritic chest pain , CT  chest showed right parapneumonic effusion and right paratracheal mass versus fluid .he was treated for pneumonia with Rocephin and azithromycin and discharged on Levaquin . he was readmitted on 6/3 with a hypoglycemic episode .he had apparently continue to take metformin and glipizide which she was asked to stop .repeat chest x-ray showed persistent right effusion . He was taken to IR for a scheduled biopsy -CT chest then showed that the paratracheal mass had resolved -this was likely pleural fluid collection with the loculation around the lateral chest wall now had air bubbles in it . Hence we're consulted . His chest pain is resolved , he denies any dyspnea , cough or sputum production . He has been afebrile since admission. WBC count over the last few days peaked at 20 K on 6/2 and is now down to 14 K   SIGNIFICANT EVENT  STUDIES:  6/6 CT chest -right paratracheal loculation has resolved.Right lateral loculation has new gas collections   SUBJECTIVE:  denies chest pain or dyspnea  Afebrile  VITAL SIGNS: Temp:  [97.9 F (36.6 C)-98.4 F (36.9 C)] 97.9 F (36.6 C) (06/08 0819) Pulse Rate:  [79-107] 89 (06/08 0700) Resp:  [18-30] 20 (06/08 0700) BP: (122-177)/(65-97) 122/78 mmHg (06/08 0700) SpO2:  [96 %-99 %] 98 % (06/08 0700) Weight:  [167 lb 15.9 oz (76.2 kg)] 167 lb 15.9 oz (76.2 kg) (06/08 0500)  PHYSICAL EXAMINATION: Gen. Pleasant, well-nourished, in no distress, normal affect ENT - no lesions, no post nasal drip Neck: No JVD, no thyromegaly, no carotid bruits Lungs: no use of accessory muscles, no dullness to percussion, decreased right base without rales or rhonchi    Cardiovascular: Rhythm regular, heart sounds  normal, no murmurs, no peripheral edema Abdomen: soft and non-tender, no hepatosplenomegaly, BS normal. Musculoskeletal: No deformities, no cyanosis or clubbing Neuro:  alert, non focal Skin:  Warm, no lesions/ rash    Recent Labs Lab 04/26/15 0259 04/27/15 0304 04/28/15 0255  NA 136 134* 134*  K 4.2 4.1 4.0  CL 104 103 104  CO2 23 23 21*  BUN 11 15 14   CREATININE 0.69 0.76 0.66  GLUCOSE 134* 138* 119*    Recent Labs Lab 04/26/15 0259 04/27/15 0304 04/28/15 0255  HGB 13.3 12.5* 12.9*  HCT 36.0* 34.6* 35.5*  WBC 15.2* 13.8* 14.5*  PLT 284 308 296   Dg Chest 2 View  04/28/2015   CLINICAL DATA:  Cough, history of right pleural effusion, followup  EXAM: CHEST  2 VIEW  COMPARISON:  CT chest of 04/26/2015 and chest x-ray of 04/24/2015  FINDINGS: The opacity peripherally at the right lung base is unchanged although there are now air-fluid levels within this opacity. This may represent empyema or infected loculated effusion. The left lung is clear. Heart size is stable.  IMPRESSION: Air-fluid levels are noted within the peripheral opacity at the right lung base consistent either with infected loculated right pleural effusion or empyema.   Electronically Signed   By: Dwyane DeePaul  Barry M.D.   On: 04/28/2015 08:09   Ct Chest Wo Contrast  04/26/2015   CLINICAL DATA:  74 year old with a suspicious right paratracheal lung lesion and scheduled for CT-guided lung biopsy.  EXAM: CT CHEST WITHOUT CONTRAST  TECHNIQUE: Multidetector CT imaging of the chest was performed following the standard protocol without IV contrast.  COMPARISON:  CTA 04/20/2015  FINDINGS: Informed consent was obtained for a CT-guided lung biopsy. The patient was placed prone and CT images were obtained of the chest. The right paratracheal collection is no longer present and this likely represented loculated pleural fluid. There continues to be complex or loculated pleural fluid along the  lateral right chest. The amount of pleural fluid in the right lateral chest has slightly increased. Overall, there does not appear to be significant mediastinal lymphadenopathy on this non contrast exam.  Again noted are emphysematous changes. There is volume loss and consolidation in the right lower lobe and there is now a air-fluid collection in the right lower lobe near the loculated pleural fluid. This could represent a focus of necrotic lung.  No acute bone abnormalities.  IMPRESSION: The right paratracheal structure has resolved and likely represented loculated pleural fluid.  There continues to be complex pleural fluid along the lateral right chest with new gas collections in the right lower lobe. The gas collections could represent necrotic lung. Findings are concerning for empyema and possible lung abscess. These structures could be better characterized with a post contrast CT.  These results were called by telephone at the time of interpretation on 04/26/2015 at 3:03 pm to Dr. [name], who verbally acknowledged these results.   Electronically Signed   By: Richarda Overlie M.D.   On: 04/26/2015 15:07    ASSESSMENT / PLAN:  Right complex lateral loculated effusion -  the paratracheal loculation noted earlier has resolved. The gas collections within this loculation raise concern for empyema. He appears to be clinically improved . I spoke to interventional radiology - dr Ardelle Anton- and requested and imaging guided thoracentesis for diagnostic purposes . Seen by  CT surgery .  He does carry a diagnosis of mild dementia -but seems to understand risks and benefits of such a procedure if required   PCCm to sign off, pl call with questions  Cyril Mourning MD. FCCP. [facility] Pager [phone] If no response call [phone]     04/28/2015, 10:08 AM

## 2015-04-28 NOTE — Progress Notes (Signed)
OT Cancellation Note  Patient Details Name: Gary DadaKenneth E Mcneil MRN: [id number] DOB: 06/17/1941   Cancelled Treatment:    Reason Eval/Treat Not Completed: Patient at procedure or test/ unavailable  Earlie RavelingStraub, Patrisia Faeth L OTR/L [phone] 04/28/2015, 3:06 PM

## 2015-04-28 NOTE — Care Management Note (Signed)
Case Management Note  Patient Details  Name: Edwin DadaKenneth E Ramthun MRN: [id number] Date of Birth: 09/13/1941  Subjective/Objective:        Talked with daughter Jasmine DecemberSharon.  Verified that patient does live at home alone.  She has not talked to her brother but in past patient has stayed with brother if needed.  Daughter states that patient forgets to take meds so needs someone to assist with his medications and reminders.  Stated that [facility] and they would come out at most 2-3x per week and not daily.  States they have someone coming in now as a reminder that helps him with his meds but sometimes the patient is not home as he does drive and goes out with his buddies at times.  Procedure with biopsies planned for today.  CM will continue to follow.             Action/Plan:   Expected Discharge Date:                  Expected Discharge Plan:  Home w Home Health Services  In-House Referral:     Discharge planning Services  CM Consult  Post Acute Care Choice:    Choice offered to:     DME Arranged:    DME Agency:     HH Arranged:    HH Agency:     Status of Service:  In process, will continue to follow  Medicare Important Message Given:  Yes Date Medicare IM Given:  04/28/15 Medicare IM give by:  Avie ArenasSarah Gurshan Settlemire, RNBSN  Date Additional Medicare IM Given:    Additional Medicare Important Message give by:     If discussed at Long Length of Stay Meetings, dates discussed:    Additional Comments:  Vangie BickerBrown, Rochell Mabie Jane, RN 04/28/2015, 2:27 PM

## 2015-04-28 NOTE — Procedures (Signed)
US guided thoracentesis of complex right pleural effusion.  Removed 40 ml of cloudy yellow fluid.  After the fluid was removed, gas was being aspirated.  The procedure was stopped once only gas could be aspirated.  No immediate complication.  Minimal blood loss.

## 2015-04-28 NOTE — Progress Notes (Signed)
Report given to RN on 6E.  Patient transferred to ultrasound for procedure and to return to new room [phone].

## 2015-04-29 ENCOUNTER — Inpatient Hospital Stay (HOSPITAL_COMMUNITY): Payer: Medicare Other

## 2015-04-29 DIAGNOSIS — E785 Hyperlipidemia, unspecified: Secondary | ICD-10-CM

## 2015-04-29 DIAGNOSIS — R110 Nausea: Secondary | ICD-10-CM

## 2015-04-29 DIAGNOSIS — R112 Nausea with vomiting, unspecified: Secondary | ICD-10-CM | POA: Diagnosis present

## 2015-04-29 DIAGNOSIS — J90 Pleural effusion, not elsewhere classified: Principal | ICD-10-CM

## 2015-04-29 LAB — GLUCOSE, CAPILLARY
Glucose-Capillary: 165 mg/dL — ABNORMAL HIGH (ref 65–99)
Glucose-Capillary: 153 mg/dL — ABNORMAL HIGH (ref 65–99)
Glucose-Capillary: 166 mg/dL — ABNORMAL HIGH (ref 65–99)
Glucose-Capillary: 137 mg/dL — ABNORMAL HIGH (ref 65–99)

## 2015-04-29 LAB — URINALYSIS, ROUTINE W REFLEX MICROSCOPIC
Glucose, UA: NEGATIVE mg/dL
Hgb urine dipstick: NEGATIVE
Ketones, ur: NEGATIVE mg/dL
Leukocytes, UA: NEGATIVE
Nitrite: NEGATIVE
Protein, ur: NEGATIVE mg/dL
Specific Gravity, Urine: 1.024 (ref 1.005–1.030)
Urobilinogen, UA: 1 mg/dL (ref 0.0–1.0)
pH: 6 (ref 5.0–8.0)

## 2015-04-29 LAB — COMPREHENSIVE METABOLIC PANEL
ALT: 32 U/L (ref 17–63)
AST: 22 U/L (ref 15–41)
Albumin: 2.3 g/dL — ABNORMAL LOW (ref 3.5–5.0)
Alkaline Phosphatase: 122 U/L (ref 38–126)
Anion gap: 8 (ref 5–15)
BUN: 14 mg/dL (ref 6–20)
CO2: 25 mmol/L (ref 22–32)
Calcium: 8.1 mg/dL — ABNORMAL LOW (ref 8.9–10.3)
Chloride: 102 mmol/L (ref 101–111)
Creatinine, Ser: 0.83 mg/dL (ref 0.61–1.24)
GFR calc Af Amer: >60 mL/min (ref >60)
GFR calc non Af Amer: >60 mL/min (ref >60)
Glucose, Bld: 150 mg/dL — ABNORMAL HIGH (ref 65–99)
Potassium: 3.6 mmol/L (ref 3.5–5.1)
Sodium: 135 mmol/L (ref 135–145)
Total Bilirubin: 1 mg/dL (ref 0.3–1.2)
Total Protein: 6.2 g/dL — ABNORMAL LOW (ref 6.5–8.1)

## 2015-04-29 LAB — BLOOD GAS, ARTERIAL
Acid-base deficit: 0.7 mmol/L (ref 0.0–2.0)
Bicarbonate: 22.5 mEq/L (ref 20.0–24.0)
Drawn by: 398331
FIO2: 0.21 %
O2 Saturation: 96.6 %
Patient temperature: 98.6
TCO2: 23.4 mmol/L (ref 0–100)
pCO2 arterial: 30.9 mmHg — ABNORMAL LOW (ref 35.0–45.0)
pH, Arterial: 7.475 — ABNORMAL HIGH (ref 7.350–7.450)
pO2, Arterial: 77.3 mmHg — ABNORMAL LOW (ref 80.0–100.0)

## 2015-04-29 LAB — CBC
HCT: 33.2 % — ABNORMAL LOW (ref 39.0–52.0)
Hemoglobin: 11.9 g/dL — ABNORMAL LOW (ref 13.0–17.0)
MCH: 29.3 pg (ref 26.0–34.0)
MCHC: 35.8 g/dL (ref 30.0–36.0)
MCV: 81.8 fL (ref 78.0–100.0)
Platelets: 342 10*3/uL (ref 150–400)
RBC: 4.06 MIL/uL — ABNORMAL LOW (ref 4.22–5.81)
RDW: 14.4 % (ref 11.5–15.5)
WBC: 12.8 10*3/uL — ABNORMAL HIGH (ref 4.0–10.5)

## 2015-04-29 LAB — SURGICAL PCR SCREEN
MRSA, PCR: NEGATIVE
Staphylococcus aureus: NEGATIVE

## 2015-04-29 LAB — APTT: aPTT: 37 seconds (ref 24–37)

## 2015-04-29 LAB — PREPARE RBC (CROSSMATCH)

## 2015-04-29 LAB — PROTIME-INR
INR: 1.22 (ref 0.00–1.49)
Prothrombin Time: 15.6 seconds — ABNORMAL HIGH (ref 11.6–15.2)

## 2015-04-29 LAB — ABO/RH: ABO/RH(D): A POS

## 2015-04-29 MED ORDER — VANCOMYCIN HCL IN DEXTROSE 750-5 MG/150ML-% IV SOLN
750.0000 mg | Freq: Three times a day (TID) | INTRAVENOUS | Status: AC
  Administered 2015-04-30 – 2015-05-05 (×16): 750 mg via INTRAVENOUS
  Filled 2015-04-29 (×17): qty 150

## 2015-04-29 MED ORDER — CAMPHOR-MENTHOL 0.5-0.5 % EX LOTN
TOPICAL_LOTION | CUTANEOUS | Status: DC | PRN
  Filled 2015-04-29: qty 222

## 2015-04-29 MED ORDER — VANCOMYCIN HCL IN DEXTROSE 1-5 GM/200ML-% IV SOLN
1000.0000 mg | INTRAVENOUS | Status: AC
  Administered 2015-04-30: 1000 mg via INTRAVENOUS
  Filled 2015-04-29 (×2): qty 200

## 2015-04-29 MED ORDER — LORAZEPAM 2 MG/ML IJ SOLN
0.5000 mg | Freq: Once | INTRAMUSCULAR | Status: DC
  Filled 2015-04-29: qty 1

## 2015-04-29 MED ORDER — ATORVASTATIN CALCIUM 20 MG PO TABS
20.0000 mg | ORAL_TABLET | Freq: Every day | ORAL | Status: DC
  Administered 2015-05-01 – 2015-05-06 (×6): 20 mg via ORAL
  Filled 2015-04-29 (×9): qty 1

## 2015-04-29 NOTE — Plan of Care (Signed)
Problem: Phase I Progression Outcomes Goal: OOB as tolerated unless otherwise ordered Outcome: Completed/Met Date Met:  04/29/15 OOB to chair x 2 today. Ambulated with PT without difficulty.

## 2015-04-29 NOTE — Progress Notes (Signed)
[place] TEAM 1 - Stepdown/ICU TEAM Progress Note  ONIX JUMPER ZOX:[id number] DOB: 04-15-1941 DOA: 04/24/2015 PCP: [name], NP  Admit HPI / Brief Narrative: Gary Mcneil is a 74 y.o. BM PMHx diabetes, mild dementia, HTN, HLD Dx on 5/31 with a right lung mass. He was found to have a CBG of 13 on 6/3.   Per his son, Gary Mcneil, the patient was recently taken off of metformin and glipizide. The pharmacy refilled his medications and included glipizide and metformin as well. The son placed these medications in the patient's med box, and consequently the patient took them. The son reports that as the patient has developed dementia he frequently takes extra pills. The son noticed on the evening of 6/2 that the patient was lethargic. He took the patient to his home fed him and kept him there to check CBGs for 24 hours. On the evening of 6/3, the patient was taken back to his home fed and put to bed. His daughter-in-law checked on him later that evening and the patient could not wake up to answer the door. EMS found his blood sugar to be 13, and the patient was brought in for treatment. Reportedly Gary Mcneil last took glipizide and metformin on the evening of 6/2, yet this morning (6/4) after multiple doses of D50 and a continuous D10 infusion, the patient's CBGs continue to drop. He has been admitted to stepdown.  In the ER: Chest x-ray shows a large mass in the right lung and right pleural effusion. Potassium is 3.4, white count is 20.4. Hemoglobin has dropped from 13.5-10.8 with dilution.  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 Understands that he will have a VATS performed in a.m. patient concerned about how long he will need to remain in the hospital following procedure    Assessment/Plan: Diabetes type 2 controlled -5/31 hemoglobin A1c= 6.5  HLD -Not within ADA guidelines, start Lipitor 20 mg daily  Hypoglycemia/Hyperglycemia -CBGs  adequately controlled -Continue moderate SSI  Right Lung loculated effusion -Large loculated effusion, S/P IR thoracentesis on 6/8 which confirmed loculated pleural effusion.  -Patient was discharged on 6/1 on Levaquin for PNA. Continue levofloxacin; vancomycin added  Severe malnutrition -Secondary to unintentional weight loss. -Continue ensure bid and encourage snacks.  Pain -Continue PRN morphine  Nausea and vomiting -Zofran     Code Status: FULL Family Communication: no family present at time of exam Disposition Plan: Diagnosis of right pleural effusion    Consultants: Dr.Kevin Allred (IR)  Dr.Rakesh Vassie Loll [facility] M)   Procedure/Significant Events: 5/31 CT angiogram chest PE protocol;Negative PE  - Masslike consolidation extending from the right lung apex inferiorly along the mediastinum to the hilum. Concerning for primary bronchogenic carcinoma. -1 cm sclerotic focus in the T8 vertebral body possible osseous metastatic disease. - Mildly enlarged low right paratracheal lymph nodel; metastatic Dz  vs Reactive adenopathy? -Small to moderate loculated right pleural effusion is presumed malignant or parapneumonic. 6/6 CT chest without contrast; Rt paratracheal structure resolved and likely represented loculated pleural fluid. -Continues to be complex pleural fluid along the lateral right chest with new gas collections in the right lower lobe. Necrotic lung vs empyema vs lung abscess  6/8 IR thoracentesis right pleural effusion; remove 40 ml cloudy yellow fluid  Culture   Antibiotics: Levofloxacin 6/1>> Vancomycin 6/7>>   DVT prophylaxis: Subcutaneous heparin;    Devices   LINES / TUBES:      Continuous Infusions:    Objective: VITAL SIGNS: Temp:  98.7 F (37.1 C) (06/09 2044) Temp Source: Oral (06/09 2044) BP: 142/76 mmHg (06/09 2044) Pulse Rate: 89 (06/09 2044) SPO2; FIO2:   Intake/Output Summary (Last 24 hours) at 04/29/15 2231 Last data  filed at 04/29/15 2002  Gross per 24 hour  Intake   1360 ml  Output    575 ml  Net    785 ml     Exam: General: A/O 4, NAD, No acute respiratory distress Eyes: Negative headache, eye pain, double vision, retinal hemorrhage ENT: Negative Runny nose,  Neck:  Negative scars, masses, torticollis, lymphadenopathy, JVD Lungs: Clear to auscultation on the left,  decreased/absent RLL breath sounds, without wheezes or crackles Cardiovascular: Regular rate and rhythm without murmur gallop or rub normal S1 and S2 Abdomen:negative abdominal pain, negative dysphagia, Nontender, nondistended, soft, bowel sounds positive, no rebound, no ascites, no appreciable mass Extremities: No significant cyanosis, clubbing, or edema bilateral lower extremities Psychiatric:  Negative depression, negative anxiety, negative fatigue, negative mania  Neurologic:  Cranial nerves II through XII intact, tongue/uvula midline, all extremities muscle strength 5/5, sensation intact throughout, negative dysarthria, negative expressive aphasia, negative receptive aphasia.      Data Reviewed: Basic Metabolic Panel:  Recent Labs Lab 04/25/15 1830 04/26/15 0259 04/27/15 0304 04/28/15 0255 04/29/15 0926  NA 131* 136 134* 134* 135  K 3.9 4.2 4.1 4.0 3.6  CL 97* 104 103 104 102  CO2 22 23 23  21* 25  GLUCOSE 267* 134* 138* 119* 150*  BUN 14 11 15 14 14   CREATININE 0.93 0.69 0.76 0.66 0.83  CALCIUM 8.7* 8.5* 8.3* 8.2* 8.1*  MG 2.2 2.0 2.0 1.9  --    Liver Function Tests:  Recent Labs Lab 04/25/15 1830 04/26/15 0259 04/27/15 0304 04/28/15 0255 04/29/15 0926  AST 73* 44* 32 29 22  ALT 68* 63 50 42 32  ALKPHOS 156* 143* 128* 121 122  BILITOT 1.0 1.1 0.8 0.9 1.0  PROT 7.0 6.5 6.0* 6.4* 6.2*  ALBUMIN 2.3* 2.3* 2.1* 2.3* 2.3*   No results for input(s): LIPASE, AMYLASE in the last 168 hours. No results for input(s): AMMONIA in the last 168 hours. CBC:  Recent Labs Lab 04/24/15 0340  04/25/15 1830  04/26/15 0259 04/27/15 0304 04/28/15 0255 04/29/15 0926  WBC 14.6*  < > 16.0* 15.2* 13.8* 14.5* 12.8*  NEUTROABS 12.6*  --  13.0* 12.0*  --  11.5*  --   HGB 10.8*  < > 13.5 13.3 12.5* 12.9* 11.9*  HCT 29.9*  < > 36.7* 36.0* 34.6* 35.5* 33.2*  MCV 82.6  < > 80.1 80.5 80.7 82.0 81.8  PLT 222  < > 306 284 308 296 342  < > = values in this interval not displayed. Cardiac Enzymes: No results for input(s): CKTOTAL, CKMB, CKMBINDEX, TROPONINI in the last 168 hours. BNP (last 3 results)  Recent Labs  04/20/15 0524  BNP 66.8    ProBNP (last 3 results) No results for input(s): PROBNP in the last 8760 hours.  CBG:  Recent Labs Lab 04/28/15 2026 04/29/15 0802 04/29/15 1145 04/29/15 1632 04/29/15 2111  GLUCAP 105* 165* 153* 166* 137*    Recent Results (from the past 240 hour(s))  Culture, blood (routine x 2)     Status: None   Collection Time: 04/20/15  8:40 AM  Result Value Ref Range Status   Specimen Description BLOOD ARM RIGHT  Final   Special Requests BOTTLES DRAWN AEROBIC AND ANAEROBIC 5CC  Final   Culture   Final  NO GROWTH 5 DAYS Performed at [facility]    Report Status 04/26/2015 FINAL  Final  Culture, blood (routine x 2)     Status: None   Collection Time: 04/20/15  8:48 AM  Result Value Ref Range Status   Specimen Description BLOOD RIGHT FOREARM  Final   Special Requests BOTTLES DRAWN AEROBIC AND ANAEROBIC 5CC  Final   Culture   Final    NO GROWTH 5 DAYS Performed at [facility]    Report Status 04/26/2015 FINAL  Final  MRSA PCR Screening     Status: None   Collection Time: 04/24/15  6:50 AM  Result Value Ref Range Status   MRSA by PCR NEGATIVE NEGATIVE Final    Comment:        The GeneXpert MRSA Assay (FDA approved for NASAL specimens only), is one component of a comprehensive MRSA colonization surveillance program. It is not intended to diagnose MRSA infection nor to guide or monitor treatment for MRSA infections.   Body  fluid culture     Status: None (Preliminary result)   Collection Time: 04/28/15  3:52 PM  Result Value Ref Range Status   Specimen Description FLUID RIGHT PLEURAL  Final   Special Requests NONE  Final   Gram Stain   Final    FEW WBC PRESENT,BOTH PMN AND MONONUCLEAR NO ORGANISMS SEEN Performed at [facility]    Culture NO GROWTH Performed at [facility]   Final   Report Status PENDING  Incomplete  Surgical pcr screen     Status: None   Collection Time: 04/29/15 10:30 AM  Result Value Ref Range Status   MRSA, PCR NEGATIVE NEGATIVE Final   Staphylococcus aureus NEGATIVE NEGATIVE Final    Comment:        The Xpert SA Assay (FDA approved for NASAL specimens in patients over 2 years of age), is one component of a comprehensive surveillance program.  Test performance has been validated by [facility] ([place] for patients greater than or equal to 25 year old. It is not intended to diagnose infection nor to guide or monitor treatment.      Studies:  Recent x-ray studies have been reviewed in detail by the Attending Physician  Scheduled Meds:  Scheduled Meds: . allopurinol  200 mg Oral Daily  . [START ON 04/30/2015] atorvastatin  20 mg Oral q1800  . donepezil  5 mg Oral QHS  . feeding supplement (ENSURE ENLIVE)  237 mL Oral BID BM  . heparin subcutaneous  5,000 Units Subcutaneous 3 times per day  . insulin aspart  0-15 Units Subcutaneous TID WC  . levofloxacin (LEVAQUIN) IV  750 mg Intravenous Q24H  . memantine  10 mg Oral BID  . multivitamin with minerals  1 tablet Oral Daily  . [START ON 04/30/2015] vancomycin  1,000 mg Intravenous On Call to OR  . vancomycin  750 mg Intravenous Q8H  . [START ON 04/30/2015] vancomycin  750 mg Intravenous Q8H    Time spent on care of this patient: 40 mins   WOODS, Roselind Messier , MD  [facility] Office  [phone] Pager - [phone]  On-Call/Text Page:      [email or web address]      password [email or web address]  If 7PM-7AM, please  contact night-coverage [email or web address] Password [email or web address] 04/29/2015, 10:31 PM   LOS: 5 days   Care during the described time interval was provided by me .  I have reviewed this patient's available data, including medical history, events of note,  physical examination, and all test results as part of my evaluation. I have personally reviewed and interpreted all radiology studies.   Dia Crawford, MD [phone] Pager

## 2015-04-29 NOTE — Plan of Care (Signed)
Problem: Consults Goal: Diabetes Guidelines if Diabetic/Glucose > 140 If diabetic or lab glucose is > 140 mg/dl - Initiate Diabetes/Hyperglycemia Guidelines & Document Interventions  Outcome: Completed/Met Date Met:  04/29/15 Diabetes coordinator consulted d/t surgery 6/10     

## 2015-04-29 NOTE — Progress Notes (Signed)
Gary Mcneil in to visit patient. Discussed VATS procedure. Consent signed. Blood consent signed. Bess Kinds, RN

## 2015-04-29 NOTE — Progress Notes (Signed)
Telephone call to Gary Mcneil, Gary Mcneil at [phone] to discuss VATS procedure to be done 6/10 by Dr. Donata Clay. [name] to come to hospital about 4pm today to sign consent for patient. Bess Kinds, RN

## 2015-04-29 NOTE — Progress Notes (Signed)
Inpatient Diabetes Program Recommendations  AACE/ADA: New Consensus Statement on Inpatient Glycemic Control (2013)  Target Ranges:  Prepandial:   less than 140 mg/dL      Peak postprandial:   less than 180 mg/dL (1-2 hours)      Critically ill patients:  140 - 180 mg/dL   Consult received from RN regarding pt scheduled for VATS tomorrow, 04/30/15. Glucose controlled well using correction while eating.  Once NPO, may want to use sensitive correction q 4 hrs as needed. Thank you Lenor Coffin, RN, MSN, CDE   Diabetes Inpatient Program Office: [phone] Pager: [phone] 8:00 am to 5:00 pm

## 2015-04-29 NOTE — Progress Notes (Signed)
Occupational Therapy Treatment Patient Details Name: Gary Mcneil MRN: [id number] DOB: 05/04/1941 Today's Date: 04/29/2015    History of present illness 74 y.o. with PMHx of diabetes, mild dementia, HTN, HLD Dx on 5/31 with a right lung mass. He was found to have a CBG of 13 on 6/3   OT comments  Patient refused most ADLs during session and tx session limited by agitation -- patient upset that therapy trying to work with him because he reports he can do everything for himself. OT will continue to follow per plan of care.  Follow Up Recommendations  No OT follow up;Supervision/Assistance - 24 hour    Equipment Recommendations  None recommended by OT    Recommendations for Other Services      Precautions / Restrictions Precautions Precautions: Fall Restrictions Weight Bearing Restrictions: No       Mobility Bed Mobility                  Transfers                      Balance                                   ADL Overall ADL's : Needs assistance/impaired Eating/Feeding: Set up;Bed level   Grooming: Wash/dry hands;Wash/dry face;Set up;Bed level                                 General ADL Comments: Patient somewhat agitated this session, reporting, "Why do I have to do all of this? I can do everything for myself!" He refused to do anything EOB/OOB and only participated minimally in ADLs this date. He did report discomfort in the bed, assisted him with repositioning and obtained another pillow per his request. Attempted to explain role of OT with patient and he insisted he had no OT needs. Continue OT per plan of care.      Vision                     Perception     Praxis      Cognition   Behavior During Therapy: Agitated Overall Cognitive Status: History of cognitive impairments - at baseline       Memory: Decreased short-term memory               Extremity/Trunk Assessment                Exercises     Shoulder Instructions       General Comments      Pertinent Vitals/ Pain       Pain Assessment: No/denies pain  Home Living                                          Prior Functioning/Environment              Frequency Min 2X/week     Progress Toward Goals  OT Goals(current goals can now be found in the care plan section)  Progress towards OT goals: Not progressing toward goals - comment (patient refused most ADLs this session)  Acute Rehab OT Goals Patient Stated Goal: not stated  Plan Discharge plan remains appropriate    Co-evaluation  End of Session     Activity Tolerance Treatment limited secondary to agitation   Patient Left in bed;with call bell/phone within reach   Nurse Communication          Time: 8299-3716 OT Time Calculation (min): 8 min  Charges: OT General Charges $OT Visit: 1 Procedure OT Treatments $Self Care/Home Management : 8-22 mins  Janeshia Ciliberto A 04/29/2015, 10:49 AM

## 2015-04-29 NOTE — Progress Notes (Addendum)
ANTIBIOTIC CONSULT NOTE - Follow up  Pharmacy Consult for vancomycin,levaquin Indication: PNA  Allergies  Allergen Reactions  . Penicillins Rash    Patient Measurements: Wt= 74.8kg  Vital Signs: Temp: 98.9 F (37.2 C) (06/09 0925) Temp Source: Oral (06/09 0925) BP: 152/82 mmHg (06/09 0925) Pulse Rate: 92 (06/09 0925) Intake/Output from previous day: 06/08 0701 - 06/09 0700 In: 860 [P.O.:460; IV Piggyback:400] Out: 650 [Urine:650] Intake/Output from this shift: Total I/O In: 510 [P.O.:360; IV Piggyback:150] Out: 225 [Urine:225]  Labs:  Recent Labs  04/27/15 0304 04/28/15 0255 04/29/15 0926  WBC 13.8* 14.5* 12.8*  HGB 12.5* 12.9* 11.9*  PLT 308 296 342  CREATININE 0.76 0.66 0.83   Estimated Creatinine Clearance: 77.9 mL/min (by C-G formula based on Cr of 0.83). No results for input(s): VANCOTROUGH, VANCOPEAK, VANCORANDOM, GENTTROUGH, GENTPEAK, GENTRANDOM, TOBRATROUGH, TOBRAPEAK, TOBRARND, AMIKACINPEAK, AMIKACINTROU, AMIKACIN in the last 72 hours.   Microbiology: Recent Results (from the past 720 hour(s))  Culture, blood (routine x 2)     Status: None   Collection Time: 04/20/15  8:40 AM  Result Value Ref Range Status   Specimen Description BLOOD ARM RIGHT  Final   Special Requests BOTTLES DRAWN AEROBIC AND ANAEROBIC 5CC  Final   Culture   Final    NO GROWTH 5 DAYS Performed at [facility]    Report Status 04/26/2015 FINAL  Final  Culture, blood (routine x 2)     Status: None   Collection Time: 04/20/15  8:48 AM  Result Value Ref Range Status   Specimen Description BLOOD RIGHT FOREARM  Final   Special Requests BOTTLES DRAWN AEROBIC AND ANAEROBIC 5CC  Final   Culture   Final    NO GROWTH 5 DAYS Performed at [facility]    Report Status 04/26/2015 FINAL  Final  MRSA PCR Screening     Status: None   Collection Time: 04/24/15  6:50 AM  Result Value Ref Range Status   MRSA by PCR NEGATIVE NEGATIVE Final    Comment:        The GeneXpert  MRSA Assay (FDA approved for NASAL specimens only), is one component of a comprehensive MRSA colonization surveillance program. It is not intended to diagnose MRSA infection nor to guide or monitor treatment for MRSA infections.   Body fluid culture     Status: None (Preliminary result)   Collection Time: 04/28/15  3:52 PM  Result Value Ref Range Status   Specimen Description FLUID RIGHT PLEURAL  Final   Special Requests NONE  Final   Gram Stain   Final    FEW WBC PRESENT,BOTH PMN AND MONONUCLEAR NO ORGANISMS SEEN Performed at [facility]    Culture NO GROWTH Performed at [facility]   Final   Report Status PENDING  Incomplete  Surgical pcr screen     Status: None   Collection Time: 04/29/15 10:30 AM  Result Value Ref Range Status   MRSA, PCR NEGATIVE NEGATIVE Final   Staphylococcus aureus NEGATIVE NEGATIVE Final    Comment:        The Xpert SA Assay (FDA approved for NASAL specimens in patients over 21 years of age), is one component of a comprehensive surveillance program.  Test performance has been validated by [facility] for patients greater than or equal to 72 year old. It is not intended to diagnose infection nor to guide or monitor treatment.     Medical History: Past Medical History  Diagnosis Date  . Type II  or unspecified type diabetes mellitus without mention of complication, uncontrolled   . DEMENTIA   . Neuromuscular disorder   . Hypertension   . Gout   . Arthritis   . Dementia 04/24/2012  . Unspecified hereditary and idiopathic peripheral neuropathy   . Anemia, unspecified   . Hypotension, unspecified   . Acute kidney failure, unspecified   . Unspecified vitamin D deficiency   . Other and unspecified hyperlipidemia   . Hypopotassemia   . Disorder of bone and cartilage, unspecified   . Memory loss   . Hypotension, unspecified   . Acute kidney failure, unspecified   . Muscle weakness (generalized)   . Dizziness and  giddiness   . Myalgia and myositis, unspecified   . Screening for ischemic heart disease   . Type II or unspecified type diabetes mellitus without mention of complication, uncontrolled   . Gout, unspecified   . Tobacco use disorder   . Memory loss   . Special screening for malignant neoplasm of prostate   . GERD (gastroesophageal reflux disease)     Medications:  Prescriptions prior to admission  Medication Sig Dispense Refill Last Dose  . allopurinol (ZYLOPRIM) 100 MG tablet Take 2 tablets (200 mg total) by mouth daily. 60 tablet 3 04/24/2015 at Unknown time  . donepezil (ARICEPT) 10 MG tablet Start 5 mg (1/2 tablet) daily at bedtime for 1 month then increase to 10 mg (1 tablet) daily at bedtime for memory (Patient taking differently: Take 5 mg by mouth at bedtime. ) 30 tablet 4 04/23/2015 at Unknown time  . glipiZIDE (GLUCOTROL XL) 5 MG 24 hr tablet Take 5 mg by mouth daily.  2 04/24/2015 at Unknown time  . hydrochlorothiazide (HYDRODIURIL) 25 MG tablet TAKE 1 TABLET BY MOUTH DAILY 30 tablet 5 04/24/2015 at Unknown time  . memantine (NAMENDA) 10 MG tablet TAKE 1 TABLET BY MOUTH TWICE DAILY 60 tablet 2 04/24/2015 at Unknown time  . Multiple Vitamin (MULTIVITAMIN WITH MINERALS) TABS tablet Take 1 tablet by mouth daily.   04/24/2015 at Unknown time  . potassium chloride SA (K-DUR,KLOR-CON) 20 MEQ tablet TAKE 2 TABLETS BY MOUTH TWICE DAILY 120 tablet 3 04/24/2015 at Unknown time  . traMADol (ULTRAM) 50 MG tablet TAKE 1-2 TABLETS BY MOUTH EVERY 8 HOURS AS NEEDED (Patient taking differently: TAKE 1-2 TABLETS BY MOUTH EVERY 8 HOURS AS NEEDED FOR PAIN.) 120 tablet 0 04/23/2015 at Unknown time  . levofloxacin (LEVAQUIN) 500 MG tablet Take 1 tablet (500 mg total) by mouth daily. 5 tablet 0 never  . [DISCONTINUED] gabapentin (NEURONTIN) 300 MG capsule Take 1 capsule (300 mg total) by mouth 3 (three) times daily. 60 capsule 1 04/21/2015 at Unknown time  . [DISCONTINUED] glipiZIDE (GLUCOTROL XL) 5 MG 24 hr tablet TAKE 1  TABLET BY MOUTH DAILY TO CONTROL DIABETES 30 tablet 2 04/21/2015 at Unknown time  . [DISCONTINUED] metFORMIN (GLUCOPHAGE) 500 MG tablet TAKE 1 TABLET BY MOUTH DAILY 30 tablet 3 04/21/2015 at Unknown time   Scheduled:  . allopurinol  200 mg Oral Daily  . donepezil  5 mg Oral QHS  . feeding supplement (ENSURE ENLIVE)  237 mL Oral BID BM  . heparin subcutaneous  5,000 Units Subcutaneous 3 times per day  . insulin aspart  0-15 Units Subcutaneous TID WC  . levofloxacin (LEVAQUIN) IV  750 mg Intravenous Q24H  . memantine  10 mg Oral BID  . multivitamin with minerals  1 tablet Oral Daily  . [START ON 04/30/2015] vancomycin  1,000  mg Intravenous On Call to OR  . vancomycin  750 mg Intravenous Q8H  . [START ON 04/30/2015] vancomycin  750 mg Intravenous Q8H   Assessment: 74 yo male with possible CAP on levaquin and vancomycin for R Necrotic PNA +/- empyema.. WBC= decreased to 12.8, WBC peaked at 20 K on 6/2,  afebrile, Tmax 99 SCr=0.83 < 0.69 and CrCl ~ 78 down from ~ 90 ml/min. Plan for thoracentesis in IR for diagnostic purposes - patient is in IR now.   6/6 vanc>> 6/4 levaquin>>   Goal of Therapy:  Vancomycin trough level 15-20 mcg/ml  Plan:  -Continue Vancomycin  IV q8h (scheduled 1300 dose has not been given yet due to pt in IR now. F/u when next dose given) Need to check steady state vancomycin trough tomorrow 04/30/15. -Continue levaquin as  IV q24hr (next dose 6/7) -Will follow renal function and clinical progress  Noah Delaine, RPh Clinical Pharmacist Pager: [phone]  04/29/2015 3:16 PM   ADDENDUM:   Scheduled for VATs tomorrow @ 0715 and MD ordered vancomycin 1 gm preop tomorrow.  F/u post IR today,  Order vancomycin trough in AM with AM labs  Noah Delaine, RPh Clinical Pharmacist Pager: [phone] 04/29/2015 3:43 PM

## 2015-04-29 NOTE — Progress Notes (Signed)
Procedure(s) (LRB): VIDEO BRONCHOSCOPY (N/A) VIDEO ASSISTED THORACOSCOPY (VATS)/DRAIN EMPYEMA (Right) Subjective: Plan R VATS in am 6-10 For empyema DNR order suspended for surgery and d/w patient  Objective: Vital signs in last 24 hours: Temp:  [98.2 F (36.8 C)-99 F (37.2 C)] 98.9 F (37.2 C) (06/09 0436) Pulse Rate:  [93-107] 95 (06/09 0436) Cardiac Rhythm:  [-]  Resp:  [17-20] 17 (06/09 0436) BP: (125-145)/(65-91) 140/75 mmHg (06/09 0436) SpO2:  [94 %-95 %] 95 % (06/09 0436) Weight:  [151 lb (68.493 kg)-160 lb (72.576 kg)] 151 lb (68.493 kg) (06/08 2030)  Hemodynamic parameters for last 24 hours:  stable  Intake/Output from previous day: 06/08 0701 - 06/09 0700 In: 860 [P.O.:460; IV Piggyback:400] Out: 650 [Urine:650] Intake/Output this shift: Total I/O In: -  Out: 125 [Urine:125]  Dec BS on R  Lab Results:  Recent Labs  04/27/15 0304 04/28/15 0255  WBC 13.8* 14.5*  HGB 12.5* 12.9*  HCT 34.6* 35.5*  PLT 308 296   BMET:  Recent Labs  04/27/15 0304 04/28/15 0255  NA 134* 134*  K 4.1 4.0  CL 103 104  CO2 23 21*  GLUCOSE 138* 119*  BUN 15 14  CREATININE 0.76 0.66  CALCIUM 8.3* 8.2*    PT/INR: No results for input(s): LABPROT, INR in the last 72 hours. ABG No results found for: PHART, HCO3, TCO2, ACIDBASEDEF, O2SAT CBG (last 3)   Recent Labs  04/28/15 1629 04/28/15 2026 04/29/15 0802  GLUCAP 158* 105* 165*    Assessment/Plan: S/P Procedure(s) (LRB): VIDEO BRONCHOSCOPY (N/A) VIDEO ASSISTED THORACOSCOPY (VATS)/DRAIN EMPYEMA (Right) OR in am   LOS: 5 days    Kathlee Nations Trigt III 04/29/2015

## 2015-04-29 NOTE — Plan of Care (Signed)
Problem: Consults Goal: Nutrition Consult-if indicated Outcome: Completed/Met Date Met:  04/29/15 Pt ordered ensure BID. Dietitian following.

## 2015-04-29 NOTE — Progress Notes (Signed)
Physical Therapy Treatment Patient Details Name: Gary Mcneil MRN: [id number] DOB: Jul 15, 1941 Today's Date: 04/29/2015    History of Present Illness 74 y.o. with PMHx of diabetes, mild dementia, HTN, HLD Dx on 5/31 with a right lung mass. He was found to have a CBG of 13 on 6/3    PT Comments    Pt was seen for mobility and to increase his balance with standing/gait.  Pt has episodes of crossing over feet which may be due to pushing the IV pole, but will reassess next visit to see what he really looks like.  PLan to continue gait with less assistance and will be heading home when discharged from hospital.  Follow Up Recommendations  Supervision/Assistance - 24 hour     Equipment Recommendations  None recommended by PT    Recommendations for Other Services       Precautions / Restrictions Precautions Precautions: Fall Restrictions Weight Bearing Restrictions: No    Mobility  Bed Mobility Overal bed mobility: Needs Assistance Bed Mobility: Supine to Sit     Supine to sit: Min guard     General bed mobility comments: monitored for safety as pt has an IV line and unaware of it  Transfers Overall transfer level: Needs assistance Equipment used: None Transfers: Sit to/from [employer] Sit to Stand: Supervision Stand pivot transfers: Min guard       General transfer comment: Pt needs supervision of lines to avoid pulling them  Ambulation/Gait Ambulation/Gait assistance: Min guard Ambulation Distance (Feet): 250 Feet Assistive device:  (IV pole to control and make pt aware of it) Gait Pattern/deviations: Step-through pattern;Drifts right/left;Wide base of support Gait velocity: fast paced Gait velocity interpretation: at or above normal speed for age/gender General Gait Details: tends to step one foot over the other at times, and startles at times which creates a sudden loss of control.  PT is providing safety with gait, and pt is not needing more  than contact and has IV pole to touch and steady him   Stairs Stairs:  (Declined them)          Wheelchair Mobility    Modified Rankin (Stroke Patients Only)       Balance Overall balance assessment: Needs assistance         Standing balance support: Single extremity supported Standing balance-Leahy Scale: Fair Standing balance comment: fair standing but fair- with mobility                    Cognition Arousal/Alertness: Awake/alert Behavior During Therapy: WFL for tasks assessed/performed Overall Cognitive Status: History of cognitive impairments - at baseline                      Exercises      General Comments General comments (skin integrity, edema, etc.): Pt is getting corrections to gait with occasional LOB that he and PT correct together.  IV pole may be a distraction but pt not focused on obstacles or  others in the hall      Pertinent Vitals/Pain Pain Assessment: No/denies pain    Home Living                      Prior Function            PT Goals (current goals can now be found in the care plan section) Acute Rehab PT Goals Patient Stated Goal: not stated Progress towards PT goals: Progressing toward goals  Frequency  Min 3X/week    PT Plan Current plan remains appropriate    Co-evaluation             End of Session Equipment Utilized During Treatment: Oxygen Activity Tolerance: Patient tolerated treatment well Patient left: in chair;with call bell/phone within reach     Time: 1715-1740 PT Time Calculation (min) (ACUTE ONLY): 25 min  Charges:  $Gait Training: 8-22 mins $Therapeutic Activity: 8-22 mins                    G Codes:      Ivar Drape 05/05/15, 6:28 PM   Samul Dada, PT MS Acute Rehab Dept. Number: [facility] [id number] and [facility] [phone]

## 2015-04-30 ENCOUNTER — Inpatient Hospital Stay (HOSPITAL_COMMUNITY): Payer: Medicare Other

## 2015-04-30 ENCOUNTER — Encounter (HOSPITAL_COMMUNITY)
Admission: EM | Disposition: A | Discharge status: Home Health | Payer: Self-pay | Source: Home / Self Care | Attending: Cardiothoracic Surgery

## 2015-04-30 ENCOUNTER — Inpatient Hospital Stay (HOSPITAL_COMMUNITY): Payer: Medicare Other | Admitting: Anesthesiology

## 2015-04-30 DIAGNOSIS — J869 Pyothorax without fistula: Secondary | ICD-10-CM

## 2015-04-30 HISTORY — PX: VIDEO BRONCHOSCOPY: SHX5072

## 2015-04-30 HISTORY — PX: VIDEO ASSISTED THORACOSCOPY (VATS)/EMPYEMA: SHX6172

## 2015-04-30 LAB — GLUCOSE, CAPILLARY
Glucose-Capillary: 193 mg/dL — ABNORMAL HIGH (ref 65–99)
Glucose-Capillary: 189 mg/dL — ABNORMAL HIGH (ref 65–99)
Glucose-Capillary: 182 mg/dL — ABNORMAL HIGH (ref 65–99)
Glucose-Capillary: 82 mg/dL (ref 65–99)

## 2015-04-30 LAB — PH, BODY FLUID: PH, FLUID: 7

## 2015-04-30 LAB — VANCOMYCIN, TROUGH: Vancomycin Tr: 15 ug/mL (ref 10.0–20.0)

## 2015-04-30 SURGERY — BRONCHOSCOPY, VIDEO-ASSISTED
Anesthesia: General | Site: Chest | Laterality: Right

## 2015-04-30 MED ORDER — PROPOFOL 10 MG/ML IV BOLUS
INTRAVENOUS | Status: DC | PRN
  Administered 2015-04-30: 160 mg via INTRAVENOUS

## 2015-04-30 MED ORDER — FENTANYL CITRATE (PF) 250 MCG/5ML IJ SOLN
INTRAMUSCULAR | Status: AC
  Filled 2015-04-30: qty 5

## 2015-04-30 MED ORDER — 0.9 % SODIUM CHLORIDE (POUR BTL) OPTIME
TOPICAL | Status: DC | PRN
  Administered 2015-04-30: 2000 mL

## 2015-04-30 MED ORDER — INSULIN ASPART 100 UNIT/ML ~~LOC~~ SOLN
0.0000 [IU] | Freq: Three times a day (TID) | SUBCUTANEOUS | Status: DC
  Administered 2015-04-30: 4 [IU] via SUBCUTANEOUS
  Administered 2015-05-01: 2 [IU] via SUBCUTANEOUS
  Administered 2015-05-01: 4 [IU] via SUBCUTANEOUS
  Administered 2015-05-01 – 2015-05-06 (×5): 2 [IU] via SUBCUTANEOUS
  Administered 2015-05-06: 4 [IU] via SUBCUTANEOUS

## 2015-04-30 MED ORDER — HYDROMORPHONE HCL 1 MG/ML IJ SOLN
INTRAMUSCULAR | Status: AC
  Filled 2015-04-30: qty 2

## 2015-04-30 MED ORDER — PHENYLEPHRINE HCL 10 MG/ML IJ SOLN
10.0000 mg | INTRAVENOUS | Status: DC | PRN
  Administered 2015-04-30: 20 ug/min via INTRAVENOUS

## 2015-04-30 MED ORDER — LEVALBUTEROL HCL 0.63 MG/3ML IN NEBU
0.6300 mg | INHALATION_SOLUTION | Freq: Four times a day (QID) | RESPIRATORY_TRACT | Status: DC
  Administered 2015-04-30 (×2): 0.63 mg via RESPIRATORY_TRACT
  Filled 2015-04-30 (×2): qty 3

## 2015-04-30 MED ORDER — ACETAMINOPHEN 500 MG PO TABS
1000.0000 mg | ORAL_TABLET | Freq: Four times a day (QID) | ORAL | Status: AC
  Administered 2015-04-30 – 2015-05-05 (×18): 1000 mg via ORAL
  Filled 2015-04-30 (×20): qty 2

## 2015-04-30 MED ORDER — DIPHENHYDRAMINE HCL 50 MG/ML IJ SOLN
INTRAMUSCULAR | Status: DC | PRN
  Administered 2015-04-30: 12.5 mg via INTRAVENOUS

## 2015-04-30 MED ORDER — KETOROLAC TROMETHAMINE 15 MG/ML IJ SOLN
15.0000 mg | Freq: Four times a day (QID) | INTRAMUSCULAR | Status: AC
  Administered 2015-04-30 (×3): 15 mg via INTRAVENOUS
  Filled 2015-04-30 (×3): qty 1

## 2015-04-30 MED ORDER — MIDAZOLAM HCL 5 MG/5ML IJ SOLN
INTRAMUSCULAR | Status: DC | PRN
  Administered 2015-04-30: 0.5 mg via INTRAVENOUS

## 2015-04-30 MED ORDER — GLYCOPYRROLATE 0.2 MG/ML IJ SOLN
INTRAMUSCULAR | Status: AC
  Filled 2015-04-30: qty 1

## 2015-04-30 MED ORDER — OXYCODONE HCL 5 MG PO TABS
5.0000 mg | ORAL_TABLET | ORAL | Status: DC | PRN
  Administered 2015-05-01 (×2): 10 mg via ORAL
  Administered 2015-05-01: 5 mg via ORAL
  Administered 2015-05-02 (×2): 10 mg via ORAL
  Filled 2015-04-30 (×3): qty 2
  Filled 2015-04-30: qty 1
  Filled 2015-04-30: qty 2

## 2015-04-30 MED ORDER — MIDAZOLAM HCL 2 MG/2ML IJ SOLN
INTRAMUSCULAR | Status: AC
  Filled 2015-04-30: qty 2

## 2015-04-30 MED ORDER — PHENYLEPHRINE 40 MCG/ML (10ML) SYRINGE FOR IV PUSH (FOR BLOOD PRESSURE SUPPORT)
PREFILLED_SYRINGE | INTRAVENOUS | Status: AC
  Filled 2015-04-30: qty 10

## 2015-04-30 MED ORDER — GLYCOPYRROLATE 0.2 MG/ML IJ SOLN
INTRAMUSCULAR | Status: DC | PRN
  Administered 2015-04-30: 0.6 mg via INTRAVENOUS

## 2015-04-30 MED ORDER — HEMOSTATIC AGENTS (NO CHARGE) OPTIME
TOPICAL | Status: DC | PRN
  Administered 2015-04-30: 1 via TOPICAL

## 2015-04-30 MED ORDER — FENTANYL 10 MCG/ML IV SOLN
INTRAVENOUS | Status: DC
  Administered 2015-04-30: 95 ug via INTRAVENOUS
  Administered 2015-04-30: 11:00:00 via INTRAVENOUS
  Administered 2015-04-30: 70 ug via INTRAVENOUS
  Administered 2015-04-30: 30 ug via INTRAVENOUS
  Administered 2015-05-01: 47.73 ug via INTRAVENOUS
  Administered 2015-05-01 (×2): 50 ug via INTRAVENOUS
  Administered 2015-05-01: 40 ug via INTRAVENOUS
  Administered 2015-05-01: 10 ug via INTRAVENOUS
  Administered 2015-05-01: 90 ug via INTRAVENOUS
  Administered 2015-05-02: 20 ug via INTRAVENOUS
  Administered 2015-05-02: 50 ug via INTRAVENOUS
  Filled 2015-04-30 (×2): qty 50

## 2015-04-30 MED ORDER — EPHEDRINE SULFATE 50 MG/ML IJ SOLN
INTRAMUSCULAR | Status: AC
  Filled 2015-04-30: qty 1

## 2015-04-30 MED ORDER — ONDANSETRON HCL 4 MG/2ML IJ SOLN: 4.0000 mg | Freq: Four times a day (QID) | INTRAMUSCULAR | Status: DC | PRN

## 2015-04-30 MED ORDER — LIDOCAINE HCL (CARDIAC) 20 MG/ML IV SOLN
INTRAVENOUS | Status: DC | PRN
  Administered 2015-04-30: 100 mg via INTRAVENOUS

## 2015-04-30 MED ORDER — HYDROMORPHONE HCL 1 MG/ML IJ SOLN
0.2500 mg | INTRAMUSCULAR | Status: DC | PRN
  Administered 2015-04-30: 1 mg via INTRAVENOUS
  Administered 2015-04-30 (×2): 0.5 mg via INTRAVENOUS

## 2015-04-30 MED ORDER — BISACODYL 5 MG PO TBEC
10.0000 mg | DELAYED_RELEASE_TABLET | Freq: Every day | ORAL | Status: DC
  Administered 2015-05-01 – 2015-05-07 (×5): 10 mg via ORAL
  Filled 2015-04-30 (×6): qty 2

## 2015-04-30 MED ORDER — ROCURONIUM BROMIDE 50 MG/5ML IV SOLN
INTRAVENOUS | Status: AC
  Filled 2015-04-30: qty 1

## 2015-04-30 MED ORDER — LIDOCAINE HCL (CARDIAC) 20 MG/ML IV SOLN
INTRAVENOUS | Status: AC
  Filled 2015-04-30: qty 5

## 2015-04-30 MED ORDER — SENNOSIDES-DOCUSATE SODIUM 8.6-50 MG PO TABS
1.0000 | ORAL_TABLET | Freq: Every day | ORAL | Status: DC
  Administered 2015-04-30 – 2015-05-06 (×4): 1 via ORAL
  Filled 2015-04-30 (×8): qty 1

## 2015-04-30 MED ORDER — POTASSIUM CHLORIDE IN NACL 20-0.45 MEQ/L-% IV SOLN
INTRAVENOUS | Status: DC
  Administered 2015-04-30 (×2): via INTRAVENOUS
  Filled 2015-04-30 (×6): qty 1000

## 2015-04-30 MED ORDER — ROCURONIUM BROMIDE 100 MG/10ML IV SOLN
INTRAVENOUS | Status: DC | PRN
  Administered 2015-04-30: 50 mg via INTRAVENOUS
  Administered 2015-04-30: 20 mg via INTRAVENOUS

## 2015-04-30 MED ORDER — ARTIFICIAL TEARS OP OINT
TOPICAL_OINTMENT | OPHTHALMIC | Status: AC
  Filled 2015-04-30: qty 3.5

## 2015-04-30 MED ORDER — DIPHENHYDRAMINE HCL 50 MG/ML IJ SOLN
12.5000 mg | Freq: Four times a day (QID) | INTRAMUSCULAR | Status: DC | PRN
  Filled 2015-04-30: qty 0.25

## 2015-04-30 MED ORDER — GLYCOPYRROLATE 0.2 MG/ML IJ SOLN
INTRAMUSCULAR | Status: AC
  Filled 2015-04-30: qty 2

## 2015-04-30 MED ORDER — ACETAMINOPHEN 160 MG/5ML PO SOLN
1000.0000 mg | Freq: Four times a day (QID) | ORAL | Status: AC
  Filled 2015-04-30: qty 40

## 2015-04-30 MED ORDER — LACTATED RINGERS IV SOLN
INTRAVENOUS | Status: DC | PRN
  Administered 2015-04-30 (×2): via INTRAVENOUS

## 2015-04-30 MED ORDER — VANCOMYCIN HCL 1000 MG IV SOLR
INTRAVENOUS | Status: DC | PRN
  Administered 2015-04-30: 1000 mL

## 2015-04-30 MED ORDER — SODIUM CHLORIDE 0.9 % IJ SOLN: 9.0000 mL | INTRAMUSCULAR | Status: DC | PRN

## 2015-04-30 MED ORDER — TRAMADOL HCL 50 MG PO TABS
50.0000 mg | ORAL_TABLET | Freq: Four times a day (QID) | ORAL | Status: DC | PRN
  Administered 2015-04-30: 50 mg via ORAL
  Administered 2015-05-01 – 2015-05-07 (×13): 100 mg via ORAL
  Filled 2015-04-30 (×8): qty 2
  Filled 2015-04-30: qty 1
  Filled 2015-04-30 (×7): qty 2

## 2015-04-30 MED ORDER — ONDANSETRON HCL 4 MG/2ML IJ SOLN: 4.0000 mg | Freq: Once | INTRAMUSCULAR | Status: DC | PRN

## 2015-04-30 MED ORDER — SUCCINYLCHOLINE CHLORIDE 20 MG/ML IJ SOLN
INTRAMUSCULAR | Status: AC
  Filled 2015-04-30: qty 1

## 2015-04-30 MED ORDER — FENTANYL CITRATE (PF) 100 MCG/2ML IJ SOLN
INTRAMUSCULAR | Status: DC | PRN
  Administered 2015-04-30: 100 ug via INTRAVENOUS
  Administered 2015-04-30: 50 ug via INTRAVENOUS
  Administered 2015-04-30 (×2): 100 ug via INTRAVENOUS

## 2015-04-30 MED ORDER — PROPOFOL 10 MG/ML IV BOLUS
INTRAVENOUS | Status: AC
  Filled 2015-04-30: qty 20

## 2015-04-30 MED ORDER — DEXTROSE 5 % IV SOLN
2.0000 g | Freq: Three times a day (TID) | INTRAVENOUS | Status: DC
  Administered 2015-04-30 – 2015-05-04 (×12): 2 g via INTRAVENOUS
  Filled 2015-04-30 (×14): qty 2

## 2015-04-30 MED ORDER — DIPHENHYDRAMINE HCL 50 MG/ML IJ SOLN
INTRAMUSCULAR | Status: AC
  Filled 2015-04-30: qty 1

## 2015-04-30 MED ORDER — LACTATED RINGERS IV SOLN
INTRAVENOUS | Status: DC | PRN
  Administered 2015-04-30: 07:00:00 via INTRAVENOUS

## 2015-04-30 MED ORDER — ONDANSETRON HCL 4 MG/2ML IJ SOLN
INTRAMUSCULAR | Status: AC
  Filled 2015-04-30: qty 2

## 2015-04-30 MED ORDER — SODIUM CHLORIDE 0.9 % IJ SOLN
INTRAMUSCULAR | Status: AC
  Filled 2015-04-30: qty 10

## 2015-04-30 MED ORDER — METOCLOPRAMIDE HCL 5 MG/ML IJ SOLN
10.0000 mg | Freq: Four times a day (QID) | INTRAMUSCULAR | Status: AC
  Administered 2015-04-30 – 2015-05-01 (×4): 10 mg via INTRAVENOUS
  Filled 2015-04-30 (×4): qty 2

## 2015-04-30 MED ORDER — LEVALBUTEROL HCL 0.63 MG/3ML IN NEBU
0.6300 mg | INHALATION_SOLUTION | Freq: Three times a day (TID) | RESPIRATORY_TRACT | Status: DC
  Administered 2015-05-01 – 2015-05-03 (×6): 0.63 mg via RESPIRATORY_TRACT
  Filled 2015-04-30 (×14): qty 3

## 2015-04-30 MED ORDER — ONDANSETRON HCL 4 MG/2ML IJ SOLN
4.0000 mg | Freq: Four times a day (QID) | INTRAMUSCULAR | Status: DC | PRN
  Filled 2015-04-30: qty 2

## 2015-04-30 MED ORDER — POTASSIUM CHLORIDE 10 MEQ/50ML IV SOLN: 10.0000 meq | Freq: Every day | INTRAVENOUS | Status: DC | PRN

## 2015-04-30 MED ORDER — VANCOMYCIN HCL 1000 MG IV SOLR
INTRAVENOUS | Status: AC
  Filled 2015-04-30: qty 1000

## 2015-04-30 MED ORDER — NEOSTIGMINE METHYLSULFATE 10 MG/10ML IV SOLN
INTRAVENOUS | Status: DC | PRN
  Administered 2015-04-30: 5 mg via INTRAVENOUS

## 2015-04-30 MED ORDER — NALOXONE HCL 0.4 MG/ML IJ SOLN
0.4000 mg | INTRAMUSCULAR | Status: DC | PRN
  Filled 2015-04-30: qty 1

## 2015-04-30 MED ORDER — NEOSTIGMINE METHYLSULFATE 10 MG/10ML IV SOLN
INTRAVENOUS | Status: AC
  Filled 2015-04-30: qty 1

## 2015-04-30 MED ORDER — DIPHENHYDRAMINE HCL 12.5 MG/5ML PO ELIX
12.5000 mg | ORAL_SOLUTION | Freq: Four times a day (QID) | ORAL | Status: DC | PRN
  Filled 2015-04-30: qty 5

## 2015-04-30 SURGICAL SUPPLY — 77 items
BAG DECANTER FOR FLEXI CONT (MISCELLANEOUS) IMPLANT
BLADE SURG 11 STRL SS (BLADE) ×4 IMPLANT
BRUSH CYTOL CELLEBRITY 1.5X140 (MISCELLANEOUS) IMPLANT
CANISTER SUCTION 2500CC (MISCELLANEOUS) ×4 IMPLANT
CATH KIT ON Q 5IN SLV (PAIN MANAGEMENT) IMPLANT
CATH ROBINSON RED A/P 22FR (CATHETERS) IMPLANT
CATH THORACIC 28FR (CATHETERS) ×4 IMPLANT
CATH THORACIC 36FR (CATHETERS) IMPLANT
CATH THORACIC 36FR RT ANG (CATHETERS) ×4 IMPLANT
CONT SPEC 4OZ CLIKSEAL STRL BL (MISCELLANEOUS) ×12 IMPLANT
COTTONBALL LRG STERILE PKG (GAUZE/BANDAGES/DRESSINGS) IMPLANT
COVER SURGICAL LIGHT HANDLE (MISCELLANEOUS) ×8 IMPLANT
COVER TABLE BACK 60X90 (DRAPES) ×4 IMPLANT
DERMABOND ADVANCED (GAUZE/BANDAGES/DRESSINGS)
DERMABOND ADVANCED .7 DNX12 (GAUZE/BANDAGES/DRESSINGS) IMPLANT
DRAIN CHANNEL 32F RND 10.7 FF (WOUND CARE) ×4 IMPLANT
DRAPE LAPAROSCOPIC ABDOMINAL (DRAPES) ×4 IMPLANT
DRAPE WARM FLUID 44X44 (DRAPE) IMPLANT
ELECT REM PT RETURN 9FT ADLT (ELECTROSURGICAL) ×4
ELECTRODE REM PT RTRN 9FT ADLT (ELECTROSURGICAL) ×2 IMPLANT
FILTER STRAW FLUID ASPIR (MISCELLANEOUS) ×4 IMPLANT
FORCEPS BIOP RJ4 1.8 (CUTTING FORCEPS) IMPLANT
GAUZE SPONGE 4X4 12PLY STRL (GAUZE/BANDAGES/DRESSINGS) ×4 IMPLANT
GLOVE BIO SURGEON STRL SZ7.5 (GLOVE) ×8 IMPLANT
GLOVE SURG SIGNA 7.5 PF LTX (GLOVE) ×8 IMPLANT
GOWN STRL REUS W/ TWL LRG LVL3 (GOWN DISPOSABLE) ×6 IMPLANT
GOWN STRL REUS W/TWL LRG LVL3 (GOWN DISPOSABLE) ×6
KIT BASIN OR (CUSTOM PROCEDURE TRAY) ×4 IMPLANT
KIT CLEAN ENDO COMPLIANCE (KITS) ×4 IMPLANT
KIT ROOM TURNOVER OR (KITS) ×4 IMPLANT
KIT SUCTION CATH 14FR (SUCTIONS) ×4 IMPLANT
MARKER SKIN DUAL TIP RULER LAB (MISCELLANEOUS) ×4 IMPLANT
NEEDLE BIOPSY TRANSBRONCH 21G (NEEDLE) IMPLANT
NEEDLE BLUNT 18X1 FOR OR ONLY (NEEDLE) IMPLANT
NEEDLE HYPO 22GX1.5 SAFETY (NEEDLE) IMPLANT
NS IRRIG 1000ML POUR BTL (IV SOLUTION) ×16 IMPLANT
OIL SILICONE PENTAX (PARTS (SERVICE/REPAIRS)) ×4 IMPLANT
PACK CHEST (CUSTOM PROCEDURE TRAY) ×4 IMPLANT
PAD ARMBOARD 7.5X6 YLW CONV (MISCELLANEOUS) ×8 IMPLANT
SEALANT SURG COSEAL 4ML (VASCULAR PRODUCTS) ×4 IMPLANT
SOLUTION ANTI FOG 6CC (MISCELLANEOUS) ×4 IMPLANT
SPONGE GAUZE 4X4 12PLY STER LF (GAUZE/BANDAGES/DRESSINGS) ×4 IMPLANT
SPONGE TONSIL 1.25 RF SGL STRG (GAUZE/BANDAGES/DRESSINGS) ×8 IMPLANT
SUT CHROMIC 3 0 SH 27 (SUTURE) IMPLANT
SUT ETHILON 3 0 PS 1 (SUTURE) IMPLANT
SUT PROLENE 3 0 SH DA (SUTURE) IMPLANT
SUT PROLENE 4 0 RB 1 (SUTURE)
SUT PROLENE 4-0 RB1 .5 CRCL 36 (SUTURE) IMPLANT
SUT PROLENE 6 0 C 1 30 (SUTURE) IMPLANT
SUT SILK  1 MH (SUTURE) ×10
SUT SILK 1 MH (SUTURE) ×10 IMPLANT
SUT SILK 1 TIES 10X30 (SUTURE) IMPLANT
SUT SILK 2 0SH CR/8 30 (SUTURE) IMPLANT
SUT SILK 3 0SH CR/8 30 (SUTURE) IMPLANT
SUT VIC AB 1 CTX 18 (SUTURE) ×8 IMPLANT
SUT VIC AB 2 TP1 27 (SUTURE) IMPLANT
SUT VIC AB 2-0 CT2 18 VCP726D (SUTURE) IMPLANT
SUT VIC AB 2-0 CTX 36 (SUTURE) ×4 IMPLANT
SUT VIC AB 3-0 SH 18 (SUTURE) IMPLANT
SUT VIC AB 3-0 X1 27 (SUTURE) IMPLANT
SUT VICRYL 0 UR6 27IN ABS (SUTURE) IMPLANT
SUT VICRYL 2 TP 1 (SUTURE) ×4 IMPLANT
SWAB COLLECTION DEVICE MRSA (MISCELLANEOUS) IMPLANT
SYR 20ML ECCENTRIC (SYRINGE) ×4 IMPLANT
SYR 5ML LUER SLIP (SYRINGE) ×4 IMPLANT
SYR CONTROL 10ML LL (SYRINGE) IMPLANT
SYSTEM SAHARA CHEST DRAIN ATS (WOUND CARE) ×4 IMPLANT
TAPE CLOTH SURG 4X10 WHT LF (GAUZE/BANDAGES/DRESSINGS) ×4 IMPLANT
TIP APPLICATOR SPRAY EXTEND 16 (VASCULAR PRODUCTS) IMPLANT
TOWEL OR 17X24 6PK STRL BLUE (TOWEL DISPOSABLE) ×4 IMPLANT
TOWEL OR 17X26 10 PK STRL BLUE (TOWEL DISPOSABLE) ×8 IMPLANT
TRAP SPECIMEN MUCOUS 40CC (MISCELLANEOUS) ×4 IMPLANT
TRAY FOLEY CATH 16FRSI W/METER (SET/KITS/TRAYS/PACK) ×4 IMPLANT
TUBE ANAEROBIC SPECIMEN COL (MISCELLANEOUS) ×4 IMPLANT
TUBE CONNECTING 20'X1/4 (TUBING) ×2
TUBE CONNECTING 20X1/4 (TUBING) ×6 IMPLANT
WATER STERILE IRR 1000ML POUR (IV SOLUTION) ×8 IMPLANT

## 2015-04-30 NOTE — Progress Notes (Signed)
The patient was examined and preop studies reviewed. There has been no change from the prior exam and the patient is ready for surgery.  Plan bronchoscopy and Right VATS for empyema on K [place]

## 2015-04-30 NOTE — Progress Notes (Signed)
Patient ID: Gary Mcneil, male   DOB: September 14, 1941, 74 y.o.   MRN: [id number] EVENING ROUNDS NOTE :     [address]       [place] [address]             [phone]                 Day of Surgery Procedure(s) (LRB): VIDEO BRONCHOSCOPY (N/A) VIDEO ASSISTED THORACOSCOPY (VATS)/DRAIN EMPYEMA (Right)  Total Length of Stay:  LOS: 6 days  BP 160/64 mmHg  Pulse 92  Temp(Src) 98 F (36.7 C) (Oral)  Resp 15  Ht 6' (1.829 m)  Wt 160 lb 4.4 oz (72.7 kg)  BMI 21.73 kg/m2  SpO2 100%  .Intake/Output      06/10 0701 - 06/11 0700   P.O. 0   I.V. (mL/kg) 2133.3 (29.3)   IV Piggyback 200   Total Intake(mL/kg) 2333.3 (32.1)   Urine (mL/kg/hr) 785 (0.9)   Stool    Blood 100 (0.1)   Chest Tube 200 (0.2)   Total Output 1085   Net +1248.3         . 0.45 % NaCl with KCl 20 mEq / L 100 mL/hr at 04/30/15 1900     Lab Results  Component Value Date   WBC 12.8* 04/29/2015   HGB 11.9* 04/29/2015   HCT 33.2* 04/29/2015   PLT 342 04/29/2015   GLUCOSE 150* 04/29/2015   CHOL 161 04/26/2015   TRIG 101 04/26/2015   HDL 30* 04/26/2015   LDLCALC 111* 04/26/2015   ALT 32 04/29/2015   AST 22 04/29/2015   NA 135 04/29/2015   K 3.6 04/29/2015   CL 102 04/29/2015   CREATININE 0.83 04/29/2015   BUN 14 04/29/2015   CO2 25 04/29/2015   TSH 2.230 08/12/2014   INR 1.22 04/29/2015   HGBA1C 6.5* 04/20/2015   Stable early post op No air leak  Delight Ovens MD  Beeper [phone] Office [phone] 04/30/2015 7:32 PM

## 2015-04-30 NOTE — Progress Notes (Signed)
Dr PVT spoke with son via phone re: post op course

## 2015-04-30 NOTE — Progress Notes (Addendum)
ANTIBIOTIC CONSULT NOTE - FOLLOW UP  Pharmacy Consult for Vancomycin  Indication: pneumonia  Allergies  Allergen Reactions  . Penicillins Rash    Labs:  Recent Labs  04/27/15 0304 04/28/15 0255 04/29/15 0926  WBC 13.8* 14.5* 12.8*  HGB 12.5* 12.9* 11.9*  PLT 308 296 342  CREATININE 0.76 0.66 0.83   Estimated Creatinine Clearance: 82.7 mL/min (by C-G formula based on Cr of 0.83).  Recent Labs  04/29/15 2326  VANCOTROUGH 15     Assessment: Therapeutic vancomycin trough  Goal of Therapy:  Vancomycin trough level 15-20 mcg/ml  Plan:  -Continue vancomycin 750 mg IV q8h (pt is getting a one-time 1000 mg dose in place of 750 for pre-op this AM) -Repeat VT as needed  Abran Duke 04/30/2015,12:42 AM  04/30/2015 12:37 PM Order received for zosyn to cover empyema, noted penicillin allergy spoke with Lowella Dandy and received order to change to ceftazidime.  Will start 2g IV q8h, Follow up SCr, UOP, cultures, clinical course and adjust as clinically indicated. Jaimen Melone C

## 2015-04-30 NOTE — Anesthesia Procedure Notes (Signed)
Procedure Name: Intubation Date/Time: 04/30/2015 7:35 AM Performed by: Izora Gala Pre-anesthesia Checklist: Patient identified, Emergency Drugs available, Suction available and Patient being monitored Patient Re-evaluated:Patient Re-evaluated prior to inductionOxygen Delivery Method: Circle system utilized Preoxygenation: Pre-oxygenation with 100% oxygen Intubation Type: IV induction Ventilation: Mask ventilation without difficulty Laryngoscope Size: Miller and 3 Grade View: Grade II Tube type: Oral Tube size: 8.5 (Intubated with single lumen for bronchoscopy) mm Airway Equipment and Method: Stylet and LTA kit utilized Placement Confirmation: ETT inserted through vocal cords under direct vision,  positive ETCO2 and breath sounds checked- equal and bilateral Secured at: 23 cm Tube secured with: Tape Dental Injury: Teeth and Oropharynx as per pre-operative assessment

## 2015-04-30 NOTE — Transfer of Care (Signed)
Immediate Anesthesia Transfer of Care Note  Patient: Gary Mcneil  Procedure(s) Performed: Procedure(s): VIDEO BRONCHOSCOPY (N/A) VIDEO ASSISTED THORACOSCOPY (VATS)/DRAIN EMPYEMA (Right)  Patient Location: PACU  Anesthesia Type:General  Level of Consciousness: awake, alert  and patient cooperative  Airway & Oxygen Therapy: Patient Spontanous Breathing and Patient connected to face mask oxygen  Post-op Assessment: Report given to RN, Post -op Vital signs reviewed and stable, Patient moving all extremities and Patient moving all extremities X 4  Post vital signs: Reviewed and stable  Last Vitals:  Filed Vitals:   04/30/15 1032  BP:   Pulse:   Temp: 36.4 C  Resp:     Complications: No apparent anesthesia complications

## 2015-04-30 NOTE — Anesthesia Preprocedure Evaluation (Addendum)
Anesthesia Evaluation  Patient identified by MRN, date of birth, ID band Patient awake    Reviewed: Allergy & Precautions, NPO status , Patient's Chart, lab work & pertinent test results  Airway Mallampati: II  TM Distance: >3 FB Neck ROM: Full    Dental  (+) Loose, Dental Advisory Given, Teeth Intact,    Pulmonary former smoker,    + decreased breath sounds      Cardiovascular hypertension, Rhythm:Regular Rate:Normal     Neuro/Psych    GI/Hepatic   Endo/Other  diabetes  Renal/GU      Musculoskeletal   Abdominal   Peds  Hematology   Anesthesia Other Findings   Reproductive/Obstetrics                           Anesthesia Physical Anesthesia Plan  ASA: III  Anesthesia Plan: General   Post-op Pain Management:    Induction: Intravenous  Airway Management Planned: Double Lumen EBT  Additional Equipment: Arterial line and CVP  Intra-op Plan:   Post-operative Plan:   Informed Consent: I have reviewed the patients History and Physical, chart, labs and discussed the procedure including the risks, benefits and alternatives for the proposed anesthesia with the patient or authorized representative who has indicated his/her understanding and acceptance.     Plan Discussed with: CRNA and Anesthesiologist  Anesthesia Plan Comments:         Anesthesia Quick Evaluation

## 2015-04-30 NOTE — Anesthesia Postprocedure Evaluation (Signed)
  Anesthesia Post-op Note  Patient: Gary Mcneil  Procedure(s) Performed: Procedure(s): VIDEO BRONCHOSCOPY (N/A) VIDEO ASSISTED THORACOSCOPY (VATS)/DRAIN EMPYEMA (Right)  Patient Location: PACU  Anesthesia Type:General  Level of Consciousness: awake, alert  and oriented  Airway and Oxygen Therapy: Patient Spontanous Breathing and Patient connected to nasal cannula oxygen  Post-op Pain: mild  Post-op Assessment: Post-op Vital signs reviewed, Patient's Cardiovascular Status Stable, Respiratory Function Stable, Patent Airway and Pain level controlled LLE Motor Response: Purposeful movement   RLE Motor Response: Purposeful movement        Post-op Vital Signs: stable  Last Vitals:  Filed Vitals:   04/30/15 1210  BP:   Pulse:   Temp: 36.4 C  Resp:     Complications: No apparent anesthesia complications

## 2015-04-30 NOTE — Brief Op Note (Signed)
04/24/2015 - 04/30/2015  10:15 AM  PATIENT:  Gary Mcneil  74 y.o. male  PRE-OPERATIVE DIAGNOSIS:  RIGHT LOCULATED EFFUSION  POST-OPERATIVE DIAGNOSIS:  * No post-op diagnosis entered *  PROCEDURE:  Procedure(s):  VIDEO BRONCHOSCOPY (N/A)  VIDEO ASSISTED THORACOSCOPY/MINI THORACOTOMY -Drainage/Decortication of Empyema  SURGEON:  Surgeon(s) and Role:    * [name], MD - Primary  PHYSICIAN ASSISTANT: Lowella Dandy PA-C  ANESTHESIA:   general  EBL:  Total I/O In: -  Out: 400 [Urine:300; Blood:100]  BLOOD ADMINISTERED:none  DRAINS: 28 Straight, 28 Blake, 36 Right Angle   LOCAL MEDICATIONS USED:  NONE  SPECIMEN:  Source of Specimen:  pleural fluid, pleural peel  DISPOSITION OF SPECIMEN:  Microbiology, Cytology, Pathology  COUNTS:  YES  TOURNIQUET:  * No tourniquets in log *  DICTATION: .Dragon Dictation  PLAN OF CARE: Admit to inpatient   PATIENT DISPOSITION:  ICU - extubated and stable.   Delay start of Pharmacological VTE agent (>24hrs) due to surgical blood loss or risk of bleeding: yes

## 2015-05-01 ENCOUNTER — Inpatient Hospital Stay (HOSPITAL_COMMUNITY): Payer: Medicare Other

## 2015-05-01 LAB — BASIC METABOLIC PANEL WITH GFR
Anion gap: 8 (ref 5–15)
BUN: 15 mg/dL (ref 6–20)
CO2: 25 mmol/L (ref 22–32)
Calcium: 8.4 mg/dL — ABNORMAL LOW (ref 8.9–10.3)
Chloride: 103 mmol/L (ref 101–111)
Creatinine, Ser: 0.65 mg/dL (ref 0.61–1.24)
GFR calc Af Amer: >60 mL/min
GFR calc non Af Amer: >60 mL/min
Glucose, Bld: 137 mg/dL — ABNORMAL HIGH (ref 65–99)
Potassium: 4.4 mmol/L (ref 3.5–5.1)
Sodium: 136 mmol/L (ref 135–145)

## 2015-05-01 LAB — POCT I-STAT 3, ART BLOOD GAS (G3+)
Acid-base deficit: 1 mmol/L (ref 0.0–2.0)
Bicarbonate: 23.5 mEq/L (ref 20.0–24.0)
O2 Saturation: 95 %
Patient temperature: 99
TCO2: 25 mmol/L (ref 0–100)
pCO2 arterial: 37.3 mmHg (ref 35.0–45.0)
pH, Arterial: 7.408 (ref 7.350–7.450)
pO2, Arterial: 77 mmHg — ABNORMAL LOW (ref 80.0–100.0)

## 2015-05-01 LAB — GLUCOSE, CAPILLARY
Glucose-Capillary: 126 mg/dL — ABNORMAL HIGH (ref 65–99)
Glucose-Capillary: 150 mg/dL — ABNORMAL HIGH (ref 65–99)
Glucose-Capillary: 89 mg/dL (ref 65–99)

## 2015-05-01 LAB — CBC
HCT: 30.1 % — ABNORMAL LOW (ref 39.0–52.0)
Hemoglobin: 10.7 g/dL — ABNORMAL LOW (ref 13.0–17.0)
MCH: 28.9 pg (ref 26.0–34.0)
MCHC: 35.5 g/dL (ref 30.0–36.0)
MCV: 81.4 fL (ref 78.0–100.0)
Platelets: 357 K/uL (ref 150–400)
RBC: 3.7 MIL/uL — ABNORMAL LOW (ref 4.22–5.81)
RDW: 14.2 % (ref 11.5–15.5)
WBC: 18.2 K/uL — ABNORMAL HIGH (ref 4.0–10.5)

## 2015-05-01 MED ORDER — ENOXAPARIN SODIUM 30 MG/0.3ML ~~LOC~~ SOLN: 30.0000 mg | SUBCUTANEOUS | Status: DC

## 2015-05-01 MED ORDER — INSULIN DETEMIR 100 UNIT/ML ~~LOC~~ SOLN
15.0000 [IU] | Freq: Every day | SUBCUTANEOUS | Status: DC
  Administered 2015-05-01 – 2015-05-03 (×3): 15 [IU] via SUBCUTANEOUS
  Filled 2015-05-01 (×4): qty 0.15

## 2015-05-01 MED ORDER — ENOXAPARIN SODIUM 30 MG/0.3ML ~~LOC~~ SOLN
30.0000 mg | SUBCUTANEOUS | Status: DC
  Administered 2015-05-01 – 2015-05-03 (×3): 30 mg via SUBCUTANEOUS
  Filled 2015-05-01 (×3): qty 0.3

## 2015-05-01 NOTE — Progress Notes (Signed)
Patient ID: Gary Mcneil, male   DOB: [birth year], 74 y.o.   MRN: [id number] EVENING ROUNDS NOTE :     [address]       [place] [address]             [phone]                 1 Day Post-Op Procedure(s) (LRB): VIDEO BRONCHOSCOPY (N/A) VIDEO ASSISTED THORACOSCOPY (VATS)/DRAIN EMPYEMA (Right)  Total Length of Stay:  LOS: 7 days  BP 155/84 mmHg  Pulse 96  Temp(Src) 97.5 F (36.4 C) (Oral)  Resp 18  Ht 6' (1.829 m)  Wt 160 lb 15 oz (73 kg)  BMI 21.82 kg/m2  SpO2 97%  .Intake/Output      06/11 0701 - 06/12 0700   P.O. 960   I.V. (mL/kg) 880.8 (12.1)   IV Piggyback 350   Total Intake(mL/kg) 2190.8 (30)   Urine (mL/kg/hr) 500 (0.5)   Blood    Chest Tube 100 (0.1)   Total Output 600   Net +1590.8         . 0.45 % NaCl with KCl 20 mEq / L 50 mL/hr at 05/01/15 2000     Lab Results  Component Value Date   WBC 18.2* 05/01/2015   HGB 10.7* 05/01/2015   HCT 30.1* 05/01/2015   PLT 357 05/01/2015   GLUCOSE 137* 05/01/2015   CHOL 161 04/26/2015   TRIG 101 04/26/2015   HDL 30* 04/26/2015   LDLCALC 111* 04/26/2015   ALT 32 04/29/2015   AST 22 04/29/2015   NA 136 05/01/2015   K 4.4 05/01/2015   CL 103 05/01/2015   CREATININE 0.65 05/01/2015   BUN 15 05/01/2015   CO2 25 05/01/2015   TSH 2.230 08/12/2014   INR 1.22 04/29/2015   HGBA1C 6.5* 04/20/2015   Glucose increasing on ss will add levimere   Delight Ovens MD  Beeper [phone] Office [phone] 05/01/2015 8:35 PM

## 2015-05-01 NOTE — Progress Notes (Signed)
[name] - Stepdown/ICU TEAM Consult F/U Note  Gary Mcneil:[id number] DOB: [birth year] DOA: 04/24/2015 PCP: [name], NP  Admit HPI / Brief Narrative: 74 y.o. M Hx diabetes, mild dementia, HTN, HLD, and a right lung mass diagnosed 5/31 who was found to have a CBG of 13 on 6/3.  The patient had been recently taken off metformin and glipizide but inadvertently continued to take them. On the evening of 6/3 the patient was taken to his home, fed, and put to bed. His daughter-in-law checked on him later that evening and the patient could not wake up to answer the door. EMS found his blood sugar to be 13, and the patient was brought in for treatment. After multiple doses of D50 and a continuous D10 infusion, the patient's CBGs continued to drop.   HPI/Subjective: Pt now on TCTS service s/p R VATS.  Patient is sitting up in a bedside chair.  He complains of pain associated with his chest tubes as is expected.   He denies other complaints.  He reports that he has a fair appetite.  He denies nausea vomiting abdominal pain fevers or chills.  He is in very good spirits and quite pleasant.  Assessment/Plan:  R Necrotic PNA +/- empyema  -IR biopsy was to be attempted 6/6 for what was originally thought to be an R hilar mass but CT noted resolution of the R paratracheal abnormality (likely a pleural fluid collection) but noted complex/loculated R lateral chest pleural effusion increased in size w/ RLL consolidation and findings worrisome for necrosis suggesting possible empyema +/- lung abscess  -pt had thoracentesis in IR w/ findings c/w empyema  -TCTS performed VATS w/ drainage of empyema on 04/30/15 -ongoing care per TCTS  Diabetes type 2 uncontrolled w/ refractory hypoglycemia  -5/31 hemoglobin A1c 6.5 - hypoglycemia at presentation appears to have been due to medication error - CBG well controlled at this time   Severe malnutrition -ensure complete bid - encourage snacks -  patient currently reporting a strong appetite  Code Status: FULL for temporary 30 day post-op period  Family Communication: No family present at time of exam today Disposition Plan: per TCTS (now primary service)  Antibiotics: Levofloxacin 6/4 > 6/9 Vanc 6/6 > ceftaz 6/10 >  DVT prophylaxis: lovenox   Objective: Blood pressure 146/73, pulse 87, temperature 98.4 F (36.9 C), temperature source Oral, resp. rate 18, height 6' (1.829 m), weight 73 kg (160 lb 15 oz), SpO2 100 %.  Intake/Output Summary (Last 24 hours) at 05/01/15 0757 Last data filed at 05/01/15 0600  Gross per 24 hour  Intake 3763.33 ml  Output   1665 ml  Net 2098.33 ml   Exam: General: No acute respiratory distress Lungs: Clear to auscultation bilaterally without wheezes or crackles  Cardiovascular: Regular rate and rhythm without murmur gallop or rub  Abdomen: Nontender, nondistended, soft, bowel sounds positive, no rebound, no ascites, no appreciable mass Extremities: No significant cyanosis, clubbing, or edema bilateral lower extremities  Data Reviewed: Basic Metabolic Panel:  Recent Labs Lab 04/25/15 1830 04/26/15 0259 04/27/15 0304 04/28/15 0255 04/29/15 0926 05/01/15 0500  NA 131* 136 134* 134* 135 136  K 3.9 4.2 4.1 4.0 3.6 4.4  CL 97* 104 103 104 102 103  CO2 21* 25 25  GLUCOSE 267* 134* 138* 119* 150* 137*  BUN CREATININE 0.93 0.69 0.76 0.66 0.83 0.65  CALCIUM 8.7* 8.5* 8.3* 8.2* 8.1* 8.4*  MG 2.2 2.0 2.0 1.9  --   --    Liver Function Tests:  Recent Labs Lab 04/25/15 1830 04/26/15 0259 04/27/15 0304 04/28/15 0255 04/29/15 0926  AST 73* 44* 32 29 22  ALT 68* 63 50 42 32  ALKPHOS 156* 143* 128* 121 122  BILITOT 1.0 1.1 0.8 0.9 1.0  PROT 7.0 6.5 6.0* 6.4* 6.2*  ALBUMIN 2.3* 2.3* 2.1* 2.3* 2.3*   CBC:  Recent Labs Lab 04/25/15 1830 04/26/15 0259 04/27/15 0304 04/28/15 0255 04/29/15 0926 05/01/15 0500  WBC 16.0* 15.2* 13.8* 14.5* 12.8* 18.2*    NEUTROABS 13.0* 12.0*  --  11.5*  --   --   HGB 13.5 13.3 12.5* 12.9* 11.9* 10.7*  HCT 36.7* 36.0* 34.6* 35.5* 33.2* 30.1*  MCV 80.1 80.5 80.7 82.0 81.8 81.4  PLT 306 284 308 296 342 357   CBG:  Recent Labs Lab 04/29/15 2111 04/30/15 1039 04/30/15 1209 04/30/15 1728 04/30/15 2154  GLUCAP 137* 193* 189* 182* 82    Recent Results (from the past 240 hour(s))  MRSA PCR Screening     Status: None   Collection Time: 04/24/15  6:50 AM  Result Value Ref Range Status   MRSA by PCR NEGATIVE NEGATIVE Final    Comment:        The GeneXpert MRSA Assay (FDA approved for NASAL specimens only), is one component of a comprehensive MRSA colonization surveillance program. It is not intended to diagnose MRSA infection nor to guide or monitor treatment for MRSA infections.   Body fluid culture     Status: None (Preliminary result)   Collection Time: 04/28/15  3:52 PM  Result Value Ref Range Status   Specimen Description FLUID RIGHT PLEURAL  Final   Special Requests NONE  Final   Gram Stain   Final    FEW WBC PRESENT,BOTH PMN AND MONONUCLEAR NO ORGANISMS SEEN Performed at [facility]    Culture   Final    NO GROWTH 1 DAY Performed at [facility]    Report Status PENDING  Incomplete  Surgical pcr screen     Status: None   Collection Time: 04/29/15 10:30 AM  Result Value Ref Range Status   MRSA, PCR NEGATIVE NEGATIVE Final   Staphylococcus aureus NEGATIVE NEGATIVE Final    Comment:        The Xpert SA Assay (FDA approved for NASAL specimens in patients over 38 years of age), is one component of a comprehensive surveillance program.  Test performance has been validated by [facility] for patients greater than or equal to 75 year old. It is not intended to diagnose infection nor to guide or monitor treatment.   Body fluid culture     Status: None (Preliminary result)   Collection Time: 04/30/15  8:59 AM  Result Value Ref Range Status   Specimen  Description FLUID PLEURAL RIGHT  Final   Special Requests SPEC C ON SWAB  Final   Gram Stain PENDING  Incomplete   Culture NO GROWTH Performed at [facility]   Final   Report Status PENDING  Incomplete     Studies:  Recent x-ray studies have been reviewed in detail by the Attending Physician  Scheduled Meds:  Scheduled Meds: . acetaminophen  1,000 mg Oral 4 times per day   Or  . acetaminophen (TYLENOL) oral liquid 160 mg/5 mL  1,000 mg Oral 4 times per day  . allopurinol  200 mg Oral Daily  . atorvastatin  20  mg Oral q1800  . bisacodyl  10 mg Oral Daily  . cefTAZidime (FORTAZ)  IV  2 g Intravenous 3 times per day  . donepezil  5 mg Oral QHS  . fentaNYL   Intravenous 6 times per day  . insulin aspart  0-24 Units Subcutaneous TID AC & HS  . levalbuterol  0.63 mg Nebulization TID  . memantine  10 mg Oral BID  . multivitamin with minerals  1 tablet Oral Daily  . senna-docusate  1 tablet Oral QHS  . vancomycin  750 mg Intravenous Q8H    Time spent on care of this patient: 25 mins  Lonia Blood, MD [facility] For Consults/Admissions - Flow Manager - [phone] Office  [phone]  Contact MD directly via text page:      [email or web address]      password [facility]  05/01/2015, 7:57 AM   LOS: 7 days

## 2015-05-01 NOTE — Progress Notes (Signed)
Patient ID: Gary Mcneil, male   DOB: 04/03/1941, 74 y.o.   MRN: [id number] TCTS DAILY ICU PROGRESS NOTE                   [address]            [place] [address]          [phone]   1 Day Post-Op Procedure(s) (LRB): VIDEO BRONCHOSCOPY (N/A) VIDEO ASSISTED THORACOSCOPY (VATS)/DRAIN EMPYEMA (Right)  Total Length of Stay:  LOS: 7 days   Subjective: Stable, alert    Objective: Vital signs in last 24 hours: Temp:  [97.5 F (36.4 C)-99.4 F (37.4 C)] 98.4 F (36.9 C) (06/11 0754) Pulse Rate:  [78-99] 95 (06/11 0900) Cardiac Rhythm:  [-] Normal sinus rhythm (06/11 0800) Resp:  [15-23] 16 (06/11 0900) BP: (122-167)/(55-87) 150/87 mmHg (06/11 0800) SpO2:  [95 %-100 %] 98 % (06/11 0929) Arterial Line BP: (114-195)/(51-80) 151/59 mmHg (06/11 0500) Weight:  [160 lb 15 oz (73 kg)] 160 lb 15 oz (73 kg) (06/11 0600)  Filed Weights   04/28/15 2030 04/29/15 2044 05/01/15 0600  Weight: 151 lb (68.493 kg) 160 lb 4.4 oz (72.7 kg) 160 lb 15 oz (73 kg)    Weight change: 10.6 oz (0.3 kg)   Hemodynamic parameters for last 24 hours:    Intake/Output from previous day: 06/10 0701 - 06/11 0700 In: 3763.3 [P.O.:180; I.V.:3133.3; IV Piggyback:450] Out: 1665 [Urine:1265; Blood:100; Chest Tube:300]  Intake/Output this shift:    Current Meds: Scheduled Meds: . acetaminophen  1,000 mg Oral 4 times per day   Or  . acetaminophen (TYLENOL) oral liquid 160 mg/5 mL  1,000 mg Oral 4 times per day  . allopurinol  200 mg Oral Daily  . atorvastatin  20 mg Oral q1800  . bisacodyl  10 mg Oral Daily  . cefTAZidime (FORTAZ)  IV  2 g Intravenous 3 times per day  . donepezil  5 mg Oral QHS  . fentaNYL   Intravenous 6 times per day  . insulin aspart  0-24 Units Subcutaneous TID AC & HS  . levalbuterol  0.63 mg Nebulization TID  . memantine  10 mg Oral BID  . multivitamin with minerals  1 tablet Oral Daily  . senna-docusate  1 tablet Oral QHS  . vancomycin  750 mg Intravenous  Q8H   Continuous Infusions: . 0.45 % NaCl with KCl 20 mEq / L 100 mL/hr at 05/01/15 0600   PRN Meds:.camphor-menthol, diphenhydrAMINE **OR** diphenhydrAMINE, hydrALAZINE, naloxone **AND** sodium chloride, ondansetron (ZOFRAN) IV, oxyCODONE, potassium chloride, traMADol, traZODone  General appearance: alert, cooperative and no distress Neurologic: intact Heart: regular rate and rhythm, S1, S2 normal, no murmur, click, rub or gallop Lungs: diminished breath sounds bibasilar Abdomen: soft, non-tender; bowel sounds normal; no masses,  no organomegaly Extremities: extremities normal, atraumatic, no cyanosis or edema and Homans sign is negative, no sign of DVT Wound: no air leak  Lab Results: CBC: Recent Labs  04/29/15 0926 05/01/15 0500  WBC 12.8* 18.2*  HGB 11.9* 10.7*  HCT 33.2* 30.1*  PLT 342 357   BMET:  Recent Labs  04/29/15 0926 05/01/15 0500  NA 135 136  K 3.6 4.4  CL 102 103  CO2 25 25  GLUCOSE 150* 137*  BUN 14 15  CREATININE 0.83 0.65  CALCIUM 8.1* 8.4*    PT/INR:  Recent Labs  04/29/15 0926  LABPROT 15.6*  INR 1.22   Radiology: Dg Chest [facility] 1 [address]  05/01/2015   CLINICAL DATA:  Empyema  EXAM: PORTABLE CHEST - 1 VIEW  COMPARISON:  04/30/2015  FINDINGS: Three right chest tubes remain unchanged in position. Right jugular central line extends into the low SVC. There is no pneumothorax. There is mild basilar opacity bilaterally without significant interval change.  IMPRESSION: Unchanged chest tubes. No pneumothorax. Unchanged basilar opacities.   Electronically Signed   By: Ellery Plunk M.D.   On: 05/01/2015 06:33     Assessment/Plan: S/P Procedure(s) (LRB): VIDEO BRONCHOSCOPY (N/A) VIDEO ASSISTED THORACOSCOPY (VATS)/DRAIN EMPYEMA (Right) Mobilize Leave chest tubes in  D/c foley     Delight Ovens 05/01/2015 11:32 AM

## 2015-05-01 NOTE — Op Note (Signed)
NAMECODEN, FRANCHI NO.:  [id number]  MEDICAL RECORD NO.:  [id number]  LOCATION:  2S10C                        FACILITY:  [facility]  PHYSICIAN:  [name], M.D.  DATE OF BIRTH:  06/07/1941  DATE OF PROCEDURE:  04/30/2015 DATE OF DISCHARGE:                              OPERATIVE REPORT   OPERATION: 1. Video bronchoscopy. 2. Right VATS (video-assisted thoracoscopic surgery) with drainage of     empyema and decortication of right lower lobe.  PREOPERATIVE DIAGNOSIS:  Right lower lobe pneumonia and empyema resistant to antibiotic therapy.  POSTOPERATIVE DIAGNOSIS:  Right lower lobe pneumonia and empyema resistant to antibiotic therapy.  SURGEON:  [name], M.D.  ASSISTANT:  Lowella Dandy, PA-C.  ANESTHESIA:  General by Dr. Kipp Brood.  INDICATIONS:  The patient is a 74 year old gentleman with diabetes and chronic medical problems who presents with a long course of respiratory infection, followed by pleuritic right chest pain, weight loss, and malaise.  X-ray studies demonstrated significant right lower lobe airspace disease as well as pleural collection.  He underwent IV antibiotics in the hospital and underwent attempted thoracentesis which returned only 60 mL of cloudy fluid.  Critical Care-Pulmonary Medicine consultation felt the patient had a probable empyema and recommended thoracic surgical evaluation and therapy.  I saw the patient in consultation and reviewed the results of his CT scan, chest x-ray studies, and the analysis of the pleural fluid taken at the time of thoracentesis.  I agreed that the patient had a probable right lower lobe pneumonia with extension into the pleural space and then empyema and would not be cured without the surgery.  I discussed the procedure of right VATS for drainage of empyema and decortication of right lower lobe with the patient and obtained informed consent.  He understood the potential risks of  bleeding, persistent air leak, recurrent empyema, and multisystem failure.  The patient's previously placed DNR was rescinded for the surgery and 30-day recovery period after surgery.  This was done with the patient's knowledge and consent.  OPERATIVE PROCEDURE:  The patient was brought to the operating room, placed supine on the operating table.  General anesthesia was induced. A proper time-out was performed.  Through the endotracheal tube, a video bronchoscope was passed.  The distal trachea and carina were normal. The proximal right mainstem bronchus had secretions that were suctioned and cleared and irrigated and sent for culture as well as cytology.  The left mainstem bronchus was normal.  The upper and lower lobe endobronchial segments of the left lung were examined and no endobronchial lesions.  The bronchoscope was passed to examine the endobronchial orifices of the right upper lobe, right middle lobe, and right lower lobe segments; and other than secretions in the lower lobes, there were no endobronchial lesions.  The bronchoscope was then withdrawn.  The patient was then reintubated with a double-lumen tube, turned to expose the right side up, and the right chest was prepped and draped as a sterile field.  A proper time-out was repeated.  A small incision was made from the tip of the scapula anteriorly in the fifth interspace.  The pleural space was entered.  There was  obliteration of the pleural space with loculated fluid, glue-like material, and a large amount of pleural peel on the right lower lobe extending up to the right middle lobe.  With slight extension of the incision, all this pleural material was carefully dissected and cleared to allow the lower lobe to re-expand.  Fluid and tissue were sent for both microbiology analysis and histology and pathology.  The chest was irrigated with antibiotic irrigation.  Three chest tubes were placed to drain the anterior,  posterior, and inferior right pleural space and brought out through separate incisions and connected to an underwater seal Pleur-Evac drainage system.  The lung was re-expanded under direct vision with good filling of the pleural space.  There was minimal bleeding.  The ribs were reapproximated with interrupted #2 Vicryl.  The muscle layers were closed with interrupted #1 Vicryl.  The subcutaneous and skin layers were closed in running Vicryl and sterile dressings were applied.  The patient was turned supine and extubated and returned to the recovery room in stable condition.     [name], M.D.     PV/MEDQ  D:  04/30/2015  T:  05/01/2015  Job:  [id number]

## 2015-05-02 ENCOUNTER — Inpatient Hospital Stay (HOSPITAL_COMMUNITY): Payer: Medicare Other

## 2015-05-02 ENCOUNTER — Encounter (HOSPITAL_COMMUNITY): Payer: Self-pay | Admitting: *Deleted

## 2015-05-02 LAB — CBC
HCT: 28.6 % — ABNORMAL LOW (ref 39.0–52.0)
HEMOGLOBIN: 10.3 g/dL — AB (ref 13.0–17.0)
MCH: 29.4 pg (ref 26.0–34.0)
MCHC: 36 g/dL (ref 30.0–36.0)
MCV: 81.7 fL (ref 78.0–100.0)
Platelets: 351 10*3/uL (ref 150–400)
RBC: 3.5 MIL/uL — ABNORMAL LOW (ref 4.22–5.81)
RDW: 14.4 % (ref 11.5–15.5)
WBC: 15.8 10*3/uL — AB (ref 4.0–10.5)

## 2015-05-02 LAB — GLUCOSE, CAPILLARY
Glucose-Capillary: 102 mg/dL — ABNORMAL HIGH (ref 65–99)
Glucose-Capillary: 131 mg/dL — ABNORMAL HIGH (ref 65–99)
Glucose-Capillary: 148 mg/dL — ABNORMAL HIGH (ref 65–99)
Glucose-Capillary: 150 mg/dL — ABNORMAL HIGH (ref 65–99)
Glucose-Capillary: 181 mg/dL — ABNORMAL HIGH (ref 65–99)
Glucose-Capillary: 92 mg/dL (ref 65–99)

## 2015-05-02 LAB — COMPREHENSIVE METABOLIC PANEL
ALT: 23 U/L (ref 17–63)
AST: 7 U/L — AB (ref 15–41)
Albumin: 2.2 g/dL — ABNORMAL LOW (ref 3.5–5.0)
Alkaline Phosphatase: 83 U/L (ref 38–126)
Anion gap: 7 (ref 5–15)
BUN: 10 mg/dL (ref 6–20)
CHLORIDE: 98 mmol/L — AB (ref 101–111)
CO2: 26 mmol/L (ref 22–32)
Calcium: 8.1 mg/dL — ABNORMAL LOW (ref 8.9–10.3)
Creatinine, Ser: 0.68 mg/dL (ref 0.61–1.24)
GFR calc non Af Amer: >60 mL/min (ref >60)
Glucose, Bld: 90 mg/dL (ref 65–99)
POTASSIUM: 3.6 mmol/L (ref 3.5–5.1)
Sodium: 131 mmol/L — ABNORMAL LOW (ref 135–145)
TOTAL PROTEIN: 5.7 g/dL — AB (ref 6.5–8.1)
Total Bilirubin: 0.7 mg/dL (ref 0.3–1.2)

## 2015-05-02 LAB — BODY FLUID CULTURE

## 2015-05-02 MED ORDER — POTASSIUM CHLORIDE CRYS ER 20 MEQ PO TBCR
20.0000 meq | EXTENDED_RELEASE_TABLET | ORAL | Status: DC | PRN
  Administered 2015-05-02 – 2015-05-03 (×2): 20 meq via ORAL
  Filled 2015-05-02 (×3): qty 1

## 2015-05-02 MED ORDER — SODIUM CHLORIDE 0.9 % IJ SOLN: 10.0000 mL | INTRAMUSCULAR | Status: DC | PRN

## 2015-05-02 MED ORDER — OXYCODONE HCL 5 MG PO TABS
5.0000 mg | ORAL_TABLET | ORAL | Status: DC | PRN
  Administered 2015-05-02 – 2015-05-07 (×18): 5 mg via ORAL
  Filled 2015-05-02 (×18): qty 1

## 2015-05-02 MED ORDER — POTASSIUM CHLORIDE CRYS ER 20 MEQ PO TBCR
20.0000 meq | EXTENDED_RELEASE_TABLET | ORAL | Status: AC
  Administered 2015-05-02 (×2): 20 meq via ORAL
  Filled 2015-05-02: qty 1

## 2015-05-02 MED ORDER — SODIUM CHLORIDE 0.9 % IJ SOLN
10.0000 mL | Freq: Two times a day (BID) | INTRAMUSCULAR | Status: DC
  Administered 2015-05-02: 10 mL
  Administered 2015-05-02 – 2015-05-03 (×2): 20 mL
  Administered 2015-05-03 – 2015-05-07 (×4): 10 mL

## 2015-05-02 NOTE — Progress Notes (Signed)
Patient ID: Gary Mcneil, male   DOB: 07-01-1941, 74 y.o.   MRN: [id number] T[facility] DAILY ICU PROGRESS NOTE                   [address]            [place] [address]          [phone]   2 Days Post-Op Procedure(s) (LRB): VIDEO BRONCHOSCOPY (N/A) VIDEO ASSISTED THORACOSCOPY (VATS)/DRAIN EMPYEMA (Right)  Total Length of Stay:  LOS: 8 days   Subjective: Awake and walking in unit, incontinent with foley out   Objective: Vital signs in last 24 hours: Temp:  [97.5 F (36.4 C)-98.8 F (37.1 C)] 98.6 F (37 C) (06/12 0749) Pulse Rate:  [85-105] 98 (06/12 0800) Cardiac Rhythm:  [-] Normal sinus rhythm (06/12 0800) Resp:  [13-24] 20 (06/12 0807) BP: (108-162)/(60-85) 118/64 mmHg (06/12 0800) SpO2:  [93 %-100 %] 99 % (06/12 0807) Weight:  [163 lb 2.3 oz (74 kg)] 163 lb 2.3 oz (74 kg) (06/12 0600)  Filed Weights   04/29/15 2044 05/01/15 0600 05/02/15 0600  Weight: 160 lb 4.4 oz (72.7 kg) 160 lb 15 oz (73 kg) 163 lb 2.3 oz (74 kg)    Weight change: 2 lb 3.3 oz (1 kg)   Hemodynamic parameters for last 24 hours:    Intake/Output from previous day: 06/11 0701 - 06/12 0700 In: 3350.8 [P.O.:1320; I.V.:1430.8; IV Piggyback:600] Out: 875 [Urine:725; Chest Tube:150]  Intake/Output this shift:    Current Meds: Scheduled Meds: . acetaminophen  1,000 mg Oral 4 times per day   Or  . acetaminophen (TYLENOL) oral liquid 160 mg/5 mL  1,000 mg Oral 4 times per day  . allopurinol  200 mg Oral Daily  . atorvastatin  20 mg Oral q1800  . bisacodyl  10 mg Oral Daily  . cefTAZidime (FORTAZ)  IV  2 g Intravenous 3 times per day  . donepezil  5 mg Oral QHS  . enoxaparin (LOVENOX) injection  30 mg Subcutaneous Q24H  . insulin aspart  0-24 Units Subcutaneous TID AC & HS  . insulin detemir  15 Units Subcutaneous Daily  . levalbuterol  0.63 mg Nebulization TID  . memantine  10 mg Oral BID  . multivitamin with minerals  1 tablet Oral Daily  . potassium chloride  20 mEq Oral  Q4H  . senna-docusate  1 tablet Oral QHS  . vancomycin  750 mg Intravenous Q8H   Continuous Infusions: . 0.45 % NaCl with KCl 20 mEq / L 50 mL/hr at 05/02/15 0700   PRN Meds:.camphor-menthol, hydrALAZINE, oxyCODONE, potassium chloride, potassium chloride, traMADol, traZODone  General appearance: alert, cooperative and no distress Neurologic: intact Heart: regular rate and rhythm, S1, S2 normal, no murmur, click, rub or gallop Lungs: dullness to percussion RLL Abdomen: soft, non-tender; bowel sounds normal; no masses,  no organomegaly Extremities: extremities normal, atraumatic, no cyanosis or edema and Homans sign is negative, no sign of DVT Wound: no air leak  Lab Results: CBC: Recent Labs  05/01/15 0500 05/02/15 0400  WBC 18.2* 15.8*  HGB 10.7* 10.3*  HCT 30.1* 28.6*  PLT 357 351   BMET:  Recent Labs  05/01/15 0500 05/02/15 0400  NA 136 131*  K 4.4 3.6  CL 103 98*  CO2 25 26  GLUCOSE 137* 90  BUN 15 10  CREATININE 0.65 0.68  CALCIUM 8.4* 8.1*    PT/INR: No results for input(s): LABPROT, INR in the last 72  hours. Radiology: Dg Chest Port 1 View  05/02/2015   CLINICAL DATA:  Check chest tube positioning  EXAM: PORTABLE CHEST - 1 VIEW  COMPARISON:  05/01/2015  FINDINGS: Postsurgical changes are again identified. Three chest tubes are again seen on the right without evidence of pneumothorax. A right central venous line is noted. The left lung remains clear. Cardiac shadow is unremarkable. Patchy changes are again noted in the right mid lung stable from the prior exam.  IMPRESSION: Postoperative changes on the right.  No pneumothorax is noted.   Electronically Signed   By: Alcide Clever M.D.   On: 05/02/2015 08:29   Recent Results (from the past 240 hour(s))  MRSA PCR Screening     Status: None   Collection Time: 04/24/15  6:50 AM  Result Value Ref Range Status   MRSA by PCR NEGATIVE NEGATIVE Final    Comment:        The GeneXpert MRSA Assay (FDA approved for NASAL  specimens only), is one component of a comprehensive MRSA colonization surveillance program. It is not intended to diagnose MRSA infection nor to guide or monitor treatment for MRSA infections.   Body fluid culture     Status: None (Preliminary result)   Collection Time: 04/28/15  3:52 PM  Result Value Ref Range Status   Specimen Description FLUID RIGHT PLEURAL  Final   Special Requests NONE  Final   Gram Stain   Final    FEW WBC PRESENT,BOTH PMN AND MONONUCLEAR NO ORGANISMS SEEN Performed at [facility]    Culture   Final    Culture reincubated for better growth Performed at [facility]    Report Status PENDING  Incomplete  Surgical pcr screen     Status: None   Collection Time: 04/29/15 10:30 AM  Result Value Ref Range Status   MRSA, PCR NEGATIVE NEGATIVE Final   Staphylococcus aureus NEGATIVE NEGATIVE Final    Comment:        The Xpert SA Assay (FDA approved for NASAL specimens in patients over 7 years of age), is one component of a comprehensive surveillance program.  Test performance has been validated by [facility] for patients greater than or equal to 16 year old. It is not intended to diagnose infection nor to guide or monitor treatment.   Culture, respiratory (NON-Expectorated)     Status: None (Preliminary result)   Collection Time: 04/30/15  8:17 AM  Result Value Ref Range Status   Specimen Description BRONCHIAL WASHINGS  Final   Special Requests NONE  Final   Gram Stain   Final    NO WBC SEEN NO SQUAMOUS EPITHELIAL CELLS SEEN NO ORGANISMS SEEN Performed at [facility]    Culture   Final    Culture reincubated for better growth Performed at [facility]    Report Status PENDING  Incomplete  Anaerobic culture     Status: None (Preliminary result)   Collection Time: 04/30/15  8:59 AM  Result Value Ref Range Status   Specimen Description FLUID PLEURAL RIGHT  Final   Special Requests SPEC C ON SWAB   Final    Gram Stain   Final    FEW WBC PRESENT,BOTH PMN AND MONONUCLEAR NO ORGANISMS SEEN Performed at [facility]    Culture   Final    NO ANAEROBES ISOLATED; CULTURE IN PROGRESS FOR 5 DAYS Performed at [facility]    Report Status PENDING  Incomplete  Fungus Culture with Smear  Status: None (Preliminary result)   Collection Time: 04/30/15  8:59 AM  Result Value Ref Range Status   Specimen Description FLUID PLEURAL RIGHT  Final   Special Requests SPEC C ON SWAB  Final   Fungal Smear   Final    NO YEAST OR FUNGAL ELEMENTS SEEN Performed at [facility]    Culture   Final    CULTURE IN PROGRESS FOR FOUR WEEKS Performed at [facility]    Report Status PENDING  Incomplete  Body fluid culture     Status: None (Preliminary result)   Collection Time: 04/30/15  8:59 AM  Result Value Ref Range Status   Specimen Description FLUID PLEURAL RIGHT  Final   Special Requests SPEC C ON SWAB  Final   Gram Stain   Final    FEW WBC PRESENT,BOTH PMN AND MONONUCLEAR NO ORGANISMS SEEN Performed at [facility]    Culture NO GROWTH Performed at [facility]   Final   Report Status PENDING  Incomplete  AFB culture with smear     Status: None (Preliminary result)   Collection Time: 04/30/15  8:59 AM  Result Value Ref Range Status   Specimen Description FLUID PLEURAL RIGHT  Final   Special Requests SPEC C ON SWAB  Final   Acid Fast Smear   Final    NO ACID FAST BACILLI SEEN Performed at [facility]    Culture   Final    CULTURE WILL BE EXAMINED FOR 6 WEEKS BEFORE ISSUING A FINAL REPORT Performed at [facility]    Report Status PENDING  Incomplete   Assessment/Plan: S/P Procedure(s) (LRB): VIDEO BRONCHOSCOPY (N/A) VIDEO ASSISTED THORACOSCOPY (VATS)/DRAIN EMPYEMA (Right) Mobilize Continue ABX therapy due to emypema Plan for transfer to step-down: see transfer orders Ct to water seal    Delight Ovens 05/02/2015 9:38  AM

## 2015-05-02 NOTE — Progress Notes (Signed)
  [facility] TEAM 1 - Stepdown/ICU TEAM  Chart reviewed.  All active medical issues are being addressed by TCTS service.  I have nothing additional to add to the care of this pleasant gentleman.  [facility] will therefore sign off.  We will be happy to assist further in his care as needed upon request.  Thank you to the TCTS team for their excellent care.    Lonia Blood, MD [facility] For Consults/Admissions - Flow Manager [phone] Office  [phone]  Contact MD directly via text page:      [email or web address]      password [facility]

## 2015-05-03 ENCOUNTER — Encounter (HOSPITAL_COMMUNITY): Payer: Self-pay | Admitting: Cardiothoracic Surgery

## 2015-05-03 ENCOUNTER — Inpatient Hospital Stay (HOSPITAL_COMMUNITY): Payer: Medicare Other

## 2015-05-03 LAB — BASIC METABOLIC PANEL
Anion gap: 8 (ref 5–15)
BUN: 13 mg/dL (ref 6–20)
CO2: 27 mmol/L (ref 22–32)
Calcium: 8.2 mg/dL — ABNORMAL LOW (ref 8.9–10.3)
Chloride: 100 mmol/L — ABNORMAL LOW (ref 101–111)
Creatinine, Ser: 0.74 mg/dL (ref 0.61–1.24)
GFR calc Af Amer: >60 mL/min (ref >60)
GFR calc non Af Amer: >60 mL/min (ref >60)
Glucose, Bld: 61 mg/dL — ABNORMAL LOW (ref 65–99)
Potassium: 3.7 mmol/L (ref 3.5–5.1)
Sodium: 135 mmol/L (ref 135–145)

## 2015-05-03 LAB — TYPE AND SCREEN
ABO/RH(D): A POS
Antibody Screen: NEGATIVE
Unit division: 0
Unit division: 0

## 2015-05-03 LAB — GLUCOSE, CAPILLARY
Glucose-Capillary: 65 mg/dL (ref 65–99)
Glucose-Capillary: 120 mg/dL — ABNORMAL HIGH (ref 65–99)
Glucose-Capillary: 178 mg/dL — ABNORMAL HIGH (ref 65–99)
Glucose-Capillary: 109 mg/dL — ABNORMAL HIGH (ref 65–99)

## 2015-05-03 LAB — CBC
HCT: 26.4 % — ABNORMAL LOW (ref 39.0–52.0)
Hemoglobin: 9.6 g/dL — ABNORMAL LOW (ref 13.0–17.0)
MCH: 29.2 pg (ref 26.0–34.0)
MCHC: 36.4 g/dL — ABNORMAL HIGH (ref 30.0–36.0)
MCV: 80.2 fL (ref 78.0–100.0)
Platelets: 349 10*3/uL (ref 150–400)
RBC: 3.29 MIL/uL — ABNORMAL LOW (ref 4.22–5.81)
RDW: 14.2 % (ref 11.5–15.5)
WBC: 11.8 10*3/uL — ABNORMAL HIGH (ref 4.0–10.5)

## 2015-05-03 LAB — CULTURE, RESPIRATORY W GRAM STAIN: Gram Stain: NONE SEEN

## 2015-05-03 MED ORDER — LEVALBUTEROL HCL 0.63 MG/3ML IN NEBU
0.6300 mg | INHALATION_SOLUTION | Freq: Two times a day (BID) | RESPIRATORY_TRACT | Status: DC
  Administered 2015-05-04 – 2015-05-07 (×7): 0.63 mg via RESPIRATORY_TRACT
  Filled 2015-05-03 (×11): qty 3

## 2015-05-03 MED ORDER — ENOXAPARIN SODIUM 40 MG/0.4ML ~~LOC~~ SOLN
40.0000 mg | SUBCUTANEOUS | Status: DC
  Administered 2015-05-06: 40 mg via SUBCUTANEOUS
  Filled 2015-05-03 (×4): qty 0.4

## 2015-05-03 NOTE — Progress Notes (Signed)
ANTIBIOTIC CONSULT NOTE - Follow up  Pharmacy Consult for vancomycin, ceftazidime Indication: PNA, empyema s/p VATS  Allergies  Allergen Reactions  . Penicillins Rash    Patient Measurements: Wt= 74.8kg  Assessment: 74 yo male with possible CAP on ceftazidime and vancomycin for R Necrotic PNA +/- empyema s/p VATS 6/11 Cultures + for a few streptococci WBC trending down, afebrile  6/6 vanc>> 6/4 levaquin>>6/10 6/10 Fortaz>  Goal of Therapy:  Vancomycin trough level 15-20 mcg/ml  Plan:  -Continue Vancomycin  IV q 8 hours (Day # 7 - consider stopping?) -Continue ceftazidime 2 grams iv Q 8 hours -Continue to follow for de-escalation       Labs:  Recent Labs  05/01/15 0500 05/02/15 0400 05/03/15 0545  WBC 18.2* 15.8* 11.8*  HGB 10.7* 10.3* 9.6*  PLT 357 351 349  CREATININE 0.65 0.68 0.74   Estimated Creatinine Clearance: 87.4 mL/min (by C-G formula based on Cr of 0.74). No results for input(s): VANCOTROUGH, VANCOPEAK, VANCORANDOM, GENTTROUGH, GENTPEAK, GENTRANDOM, TOBRATROUGH, TOBRAPEAK, TOBRARND, AMIKACINPEAK, AMIKACINTROU, AMIKACIN in the last 72 hours.   Microbiology: Recent Results (from the past 720 hour(s))  Culture, blood (routine x 2)     Status: None   Collection Time: 04/20/15  8:40 AM  Result Value Ref Range Status   Specimen Description BLOOD ARM RIGHT  Final   Special Requests BOTTLES DRAWN AEROBIC AND ANAEROBIC 5CC  Final   Culture   Final    NO GROWTH 5 DAYS Performed at [facility]    Report Status 04/26/2015 FINAL  Final  Culture, blood (routine x 2)     Status: None   Collection Time: 04/20/15  8:48 AM  Result Value Ref Range Status   Specimen Description BLOOD RIGHT FOREARM  Final   Special Requests BOTTLES DRAWN AEROBIC AND ANAEROBIC 5CC  Final   Culture   Final    NO GROWTH 5 DAYS Performed at [facility]    Report Status 04/26/2015 FINAL  Final  MRSA PCR Screening     Status: None   Collection Time:  04/24/15  6:50 AM  Result Value Ref Range Status   MRSA by PCR NEGATIVE NEGATIVE Final    Comment:        The GeneXpert MRSA Assay (FDA approved for NASAL specimens only), is one component of a comprehensive MRSA colonization surveillance program. It is not intended to diagnose MRSA infection nor to guide or monitor treatment for MRSA infections.   Body fluid culture     Status: None   Collection Time: 04/28/15  3:52 PM  Result Value Ref Range Status   Specimen Description FLUID RIGHT PLEURAL  Final   Special Requests NONE  Final   Gram Stain   Final    FEW WBC PRESENT,BOTH PMN AND MONONUCLEAR NO ORGANISMS SEEN Performed at [facility]    Culture   Final    FEW MICROAEROPHILIC STREPTOCOCCI Note: Standardized susceptibility testing for this organism is not available. CALLED TO NURSE CHRISTINE 05/02/15 1420 BY SMITHERSJ Performed at [facility]    Report Status 05/02/2015 FINAL  Final  Surgical pcr screen     Status: None   Collection Time: 04/29/15 10:30 AM  Result Value Ref Range Status   MRSA, PCR NEGATIVE NEGATIVE Final   Staphylococcus aureus NEGATIVE NEGATIVE Final    Comment:        The Xpert SA Assay (FDA approved for NASAL specimens in patients over 77 years of age), is one component of  a comprehensive surveillance program.  Test performance has been validated by [facility] for patients greater than or equal to 25 year old. It is not intended to diagnose infection nor to guide or monitor treatment.   Culture, respiratory (NON-Expectorated)     Status: None (Preliminary result)   Collection Time: 04/30/15  8:17 AM  Result Value Ref Range Status   Specimen Description BRONCHIAL WASHINGS  Final   Special Requests NONE  Final   Gram Stain   Final    NO WBC SEEN NO SQUAMOUS EPITHELIAL CELLS SEEN NO ORGANISMS SEEN Performed at [facility]    Culture   Final    FEW CANDIDA ALBICANS Performed at [facility]    Report  Status PENDING  Incomplete  Anaerobic culture     Status: None (Preliminary result)   Collection Time: 04/30/15  8:59 AM  Result Value Ref Range Status   Specimen Description FLUID PLEURAL RIGHT  Final   Special Requests SPEC C ON SWAB   Final   Gram Stain   Final    FEW WBC PRESENT,BOTH PMN AND MONONUCLEAR NO ORGANISMS SEEN Performed at [facility]    Culture   Final    NO ANAEROBES ISOLATED; CULTURE IN PROGRESS FOR 5 DAYS Performed at [facility]    Report Status PENDING  Incomplete  Fungus Culture with Smear     Status: None (Preliminary result)   Collection Time: 04/30/15  8:59 AM  Result Value Ref Range Status   Specimen Description FLUID PLEURAL RIGHT  Final   Special Requests SPEC C ON SWAB  Final   Fungal Smear   Final    NO YEAST OR FUNGAL ELEMENTS SEEN Performed at [facility]    Culture   Final    CULTURE IN PROGRESS FOR FOUR WEEKS Performed at [facility]    Report Status PENDING  Incomplete  Body fluid culture     Status: None (Preliminary result)   Collection Time: 04/30/15  8:59 AM  Result Value Ref Range Status   Specimen Description FLUID PLEURAL RIGHT  Final   Special Requests SPEC C ON SWAB  Final   Gram Stain   Final    FEW WBC PRESENT,BOTH PMN AND MONONUCLEAR NO ORGANISMS SEEN Performed at [facility]    Culture   Final    NO GROWTH 1 DAY Performed at [facility]    Report Status PENDING  Incomplete  AFB culture with smear     Status: None (Preliminary result)   Collection Time: 04/30/15  8:59 AM  Result Value Ref Range Status   Specimen Description FLUID PLEURAL RIGHT  Final   Special Requests SPEC C ON SWAB  Final   Acid Fast Smear   Final    NO ACID FAST BACILLI SEEN Performed at [facility]    Culture   Final    CULTURE WILL BE EXAMINED FOR 6 WEEKS BEFORE ISSUING A FINAL REPORT Performed at [facility]    Report Status PENDING  Incomplete    Thank you. Okey Regal, PharmD [phone]    05/03/2015 10:02 AM

## 2015-05-03 NOTE — Progress Notes (Signed)
UR COMPLETED  

## 2015-05-03 NOTE — Progress Notes (Signed)
Medicare Important Message given? YES (If response is "NO", the following Medicare IM given date fields will be blank) Date Medicare IM given:05/03/15 Medicare IM given by: Henriette Hesser 

## 2015-05-03 NOTE — Progress Notes (Addendum)
[address]       [place] [address]             [phone]      3 Days Post-Op Procedure(s) (LRB): VIDEO BRONCHOSCOPY (N/A) VIDEO ASSISTED THORACOSCOPY (VATS)/DRAIN EMPYEMA (Right) Subjective: Feeling better  Objective: Vital signs in last 24 hours: Temp:  [97 F (36.1 C)-99.5 F (37.5 C)] 99 F (37.2 C) (06/13 0700) Pulse Rate:  [84-114] 84 (06/13 0840) Cardiac Rhythm:  [-] Sinus bradycardia (06/13 0800) Resp:  [15-24] 15 (06/13 0840) BP: (117-156)/(69-94) 153/77 mmHg (06/13 0840) SpO2:  [94 %-100 %] 99 % (06/13 0840)  Hemodynamic parameters for last 24 hours:    Intake/Output from previous day: 06/12 0701 - 06/13 0700 In: 715.3 [P.O.:180; I.V.:135.3; IV Piggyback:400] Out: 535 [Urine:475; Chest Tube:60] Intake/Output this shift: Total I/O In: 390 [P.O.:240; IV Piggyback:150] Out: 200 [Urine:200]  General appearance: alert, cooperative and no distress Heart: regular rate and rhythm Lungs: dim in right base Abdomen: benign Extremities: no edema Wound: dressing CDI  Lab Results:  Recent Labs  05/02/15 0400 05/03/15 0545  WBC 15.8* 11.8*  HGB 10.3* 9.6*  HCT 28.6* 26.4*  PLT 351 349   BMET:  Recent Labs  05/02/15 0400 05/03/15 0545  NA 131* 135  K 3.6 3.7  CL 98* 100*  CO2 26 27  GLUCOSE 90 61*  BUN 10 13  CREATININE 0.68 0.74  CALCIUM 8.1* 8.2*    PT/INR: No results for input(s): LABPROT, INR in the last 72 hours. ABG    Component Value Date/Time   PHART 7.408 05/01/2015 0406   HCO3 23.5 05/01/2015 0406   TCO2 25 05/01/2015 0406   ACIDBASEDEF 1.0 05/01/2015 0406   O2SAT 95.0 05/01/2015 0406   CBG (last 3)   Recent Labs  05/02/15 1701 05/02/15 2111 05/03/15 0748  GLUCAP 150* 102* 65    Meds Scheduled Meds: . acetaminophen  1,000 mg Oral 4 times per day   Or  . acetaminophen (TYLENOL) oral liquid 160 mg/5 mL  1,000 mg Oral 4 times per day  . allopurinol  200 mg Oral Daily  . atorvastatin  20 mg Oral  q1800  . bisacodyl  10 mg Oral Daily  . cefTAZidime (FORTAZ)  IV  2 g Intravenous 3 times per day  . donepezil  5 mg Oral QHS  . enoxaparin (LOVENOX) injection  30 mg Subcutaneous Q24H  . insulin aspart  0-24 Units Subcutaneous TID AC & HS  . insulin detemir  15 Units Subcutaneous Daily  . levalbuterol  0.63 mg Nebulization TID  . memantine  10 mg Oral BID  . multivitamin with minerals  1 tablet Oral Daily  . senna-docusate  1 tablet Oral QHS  . sodium chloride  10-40 mL Intracatheter Q12H  . vancomycin  750 mg Intravenous Q8H   Continuous Infusions: . 0.45 % NaCl with KCl 20 mEq / L 10 mL/hr at 05/02/15 1000   PRN Meds:.camphor-menthol, hydrALAZINE, oxyCODONE, potassium chloride, potassium chloride, sodium chloride, traMADol, traZODone  Xrays Dg Chest Port 1 View  05/03/2015   CLINICAL DATA:  Chest tube.  EXAM: PORTABLE CHEST - 1 VIEW  COMPARISON:  05/02/2015 and CT chest 04/20/2015.  FINDINGS: Heart size normal. Right IJ central line tip projects over the SVC. Three chest tubes are seen on the right. Lungs are low in volume with patchy bilateral airspace disease at the bases, right greater than left. Small partially loculated right pleural effusion, stable. No definite  pneumothorax.  IMPRESSION: Mild patchy bibasilar airspace opacification with a small loculated right pleural effusion and 3 right chest tubes in place.   Electronically Signed   By: Leanna Battles M.D.   On: 05/03/2015 07:46   Dg Chest Port 1 View  05/02/2015   CLINICAL DATA:  Check chest tube positioning  EXAM: PORTABLE CHEST - 1 VIEW  COMPARISON:  05/01/2015  FINDINGS: Postsurgical changes are again identified. Three chest tubes are again seen on the right without evidence of pneumothorax. A right central venous line is noted. The left lung remains clear. Cardiac shadow is unremarkable. Patchy changes are again noted in the right mid lung stable from the prior exam.  IMPRESSION: Postoperative changes on the right.  No  pneumothorax is noted.   Electronically Signed   By: Alcide Clever M.D.   On: 05/02/2015 08:29   Results for orders placed or performed during the hospital encounter of 04/24/15  MRSA PCR Screening     Status: None   Collection Time: 04/24/15  6:50 AM  Result Value Ref Range Status   MRSA by PCR NEGATIVE NEGATIVE Final    Comment:        The GeneXpert MRSA Assay (FDA approved for NASAL specimens only), is one component of a comprehensive MRSA colonization surveillance program. It is not intended to diagnose MRSA infection nor to guide or monitor treatment for MRSA infections.   Body fluid culture     Status: None   Collection Time: 04/28/15  3:52 PM  Result Value Ref Range Status   Specimen Description FLUID RIGHT PLEURAL  Final   Special Requests NONE  Final   Gram Stain   Final    FEW WBC PRESENT,BOTH PMN AND MONONUCLEAR NO ORGANISMS SEEN Performed at [facility]    Culture   Final    FEW MICROAEROPHILIC STREPTOCOCCI Note: Standardized susceptibility testing for this organism is not available. CALLED TO NURSE CHRISTINE 05/02/15 1420 BY SMITHERSJ Performed at [facility]    Report Status 05/02/2015 FINAL  Final  Surgical pcr screen     Status: None   Collection Time: 04/29/15 10:30 AM  Result Value Ref Range Status   MRSA, PCR NEGATIVE NEGATIVE Final   Staphylococcus aureus NEGATIVE NEGATIVE Final    Comment:        The Xpert SA Assay (FDA approved for NASAL specimens in patients over 44 years of age), is one component of a comprehensive surveillance program.  Test performance has been validated by [facility] for patients greater than or equal to 63 year old. It is not intended to diagnose infection nor to guide or monitor treatment.   Culture, respiratory (NON-Expectorated)     Status: None   Collection Time: 04/30/15  8:17 AM  Result Value Ref Range Status   Specimen Description BRONCHIAL WASHINGS  Final   Special Requests NONE  Final    Gram Stain   Final    NO WBC SEEN NO SQUAMOUS EPITHELIAL CELLS SEEN NO ORGANISMS SEEN Performed at [facility]    Culture   Final    FEW CANDIDA ALBICANS Performed at [facility]    Report Status 05/03/2015 FINAL  Final  Anaerobic culture     Status: None (Preliminary result)   Collection Time: 04/30/15  8:59 AM  Result Value Ref Range Status   Specimen Description FLUID PLEURAL RIGHT  Final   Special Requests SPEC C ON SWAB   Final   Gram Stain  Final    FEW WBC PRESENT,BOTH PMN AND MONONUCLEAR NO ORGANISMS SEEN Performed at [facility]    Culture   Final    NO ANAEROBES ISOLATED; CULTURE IN PROGRESS FOR 5 DAYS Performed at [facility]    Report Status PENDING  Incomplete  Fungus Culture with Smear     Status: None (Preliminary result)   Collection Time: 04/30/15  8:59 AM  Result Value Ref Range Status   Specimen Description FLUID PLEURAL RIGHT  Final   Special Requests SPEC C ON SWAB  Final   Fungal Smear   Final    NO YEAST OR FUNGAL ELEMENTS SEEN Performed at [facility]    Culture   Final    CULTURE IN PROGRESS FOR FOUR WEEKS Performed at [facility]    Report Status PENDING  Incomplete  Body fluid culture     Status: None (Preliminary result)   Collection Time: 04/30/15  8:59 AM  Result Value Ref Range Status   Specimen Description FLUID PLEURAL RIGHT  Final   Special Requests SPEC C ON SWAB  Final   Gram Stain   Final    FEW WBC PRESENT,BOTH PMN AND MONONUCLEAR NO ORGANISMS SEEN Performed at [facility]    Culture   Final    NO GROWTH 1 DAY Performed at [facility]    Report Status PENDING  Incomplete  AFB culture with smear     Status: None (Preliminary result)   Collection Time: 04/30/15  8:59 AM  Result Value Ref Range Status   Specimen Description FLUID PLEURAL RIGHT  Final   Special Requests SPEC C ON SWAB  Final   Acid Fast Smear   Final    NO ACID FAST BACILLI  SEEN Performed at [facility]    Culture   Final    CULTURE WILL BE EXAMINED FOR 6 WEEKS BEFORE ISSUING A FINAL REPORT Performed at [facility]    Report Status PENDING  Incomplete   Assessment/Plan: S/P Procedure(s) (LRB): VIDEO BRONCHOSCOPY (N/A) VIDEO ASSISTED THORACOSCOPY (VATS)/DRAIN EMPYEMA (Right)  1 posterior tube being d/c'd per surgeon order, 2 tubes remain- 100 cc out yesterday 2 tm 99.5, leukocytosis improving 3 systolic HTN at times but has h/o hypotension- monitor but may need rx 4 on vanc and fortaz- -nothing growing in cultures currently 5 sugars adeq controlled- will need po meds started in near future  LOS: 9 days    GOLD,WAYNE E 05/03/2015  Pleural material with microaerophilic Streptococcus-continue IV Zosyn with vancomycin until chest tubes out then transitioned to oral antimicrobials--probably oral Ceftin as patient is allergic to penicillin.

## 2015-05-04 ENCOUNTER — Inpatient Hospital Stay (HOSPITAL_COMMUNITY): Payer: Medicare Other

## 2015-05-04 LAB — BASIC METABOLIC PANEL
Anion gap: 7 (ref 5–15)
BUN: 13 mg/dL (ref 6–20)
CO2: 28 mmol/L (ref 22–32)
Calcium: 8.2 mg/dL — ABNORMAL LOW (ref 8.9–10.3)
Chloride: 101 mmol/L (ref 101–111)
Creatinine, Ser: 0.69 mg/dL (ref 0.61–1.24)
GFR calc Af Amer: >60 mL/min (ref >60)
GFR calc non Af Amer: >60 mL/min (ref >60)
Glucose, Bld: 46 mg/dL — ABNORMAL LOW (ref 65–99)
Potassium: 3.6 mmol/L (ref 3.5–5.1)
Sodium: 136 mmol/L (ref 135–145)

## 2015-05-04 LAB — CBC
HCT: 25.3 % — ABNORMAL LOW (ref 39.0–52.0)
Hemoglobin: 9.1 g/dL — ABNORMAL LOW (ref 13.0–17.0)
MCH: 29.3 pg (ref 26.0–34.0)
MCHC: 36 g/dL (ref 30.0–36.0)
MCV: 81.4 fL (ref 78.0–100.0)
Platelets: 354 10*3/uL (ref 150–400)
RBC: 3.11 MIL/uL — ABNORMAL LOW (ref 4.22–5.81)
RDW: 14.5 % (ref 11.5–15.5)
WBC: 11.3 10*3/uL — ABNORMAL HIGH (ref 4.0–10.5)

## 2015-05-04 LAB — GLUCOSE, CAPILLARY
Glucose-Capillary: 48 mg/dL — ABNORMAL LOW (ref 65–99)
Glucose-Capillary: 112 mg/dL — ABNORMAL HIGH (ref 65–99)
Glucose-Capillary: 77 mg/dL (ref 65–99)
Glucose-Capillary: 163 mg/dL — ABNORMAL HIGH (ref 65–99)
Glucose-Capillary: 131 mg/dL — ABNORMAL HIGH (ref 65–99)
Glucose-Capillary: 108 mg/dL — ABNORMAL HIGH (ref 65–99)

## 2015-05-04 LAB — BODY FLUID CULTURE

## 2015-05-04 MED ORDER — PIPERACILLIN-TAZOBACTAM 3.375 G IVPB: 3.3750 g | Freq: Three times a day (TID) | INTRAVENOUS | Status: DC

## 2015-05-04 MED ORDER — INSULIN DETEMIR 100 UNIT/ML ~~LOC~~ SOLN
10.0000 [IU] | Freq: Every day | SUBCUTANEOUS | Status: DC
  Filled 2015-05-04 (×2): qty 0.1

## 2015-05-04 MED ORDER — AMLODIPINE BESYLATE 5 MG PO TABS
5.0000 mg | ORAL_TABLET | Freq: Every day | ORAL | Status: DC
  Administered 2015-05-04 – 2015-05-05 (×2): 5 mg via ORAL
  Filled 2015-05-04 (×2): qty 1

## 2015-05-04 MED ORDER — DEXTROSE 5 % IV SOLN
1.0000 g | Freq: Three times a day (TID) | INTRAVENOUS | Status: DC
  Administered 2015-05-04 – 2015-05-07 (×9): 1 g via INTRAVENOUS
  Filled 2015-05-04 (×12): qty 1

## 2015-05-04 NOTE — Progress Notes (Signed)
Patient back to bed.

## 2015-05-04 NOTE — Progress Notes (Signed)
Pt refused ambulationagain today after multiple attempts, pt educated on need for ambulation. Continue to monitor.

## 2015-05-04 NOTE — Progress Notes (Signed)
PHARMACIST - PHYSICIAN COMMUNICATION  CONCERNING:  Vancomycin   RECOMMENDATION: Consider stopping vancomycin if appropriate.  Cultures negative to date.  DESCRIPTION: Day # 8 of vancomycin therapy   Thank you. Okey Regal, PharmD [phone]

## 2015-05-04 NOTE — Progress Notes (Signed)
Pt refused to walk after multiple request explain to patient the importance of walking pt states "it pains me to walk" pain meds given about a hour before attempting ambulation states his pain is a 1 right now will continue to monitor

## 2015-05-04 NOTE — Progress Notes (Signed)
Inpatient Diabetes Program Recommendations  AACE/ADA: New Consensus Statement on Inpatient Glycemic Control (2013)  Target Ranges:  Prepandial:   less than 140 mg/dL      Peak postprandial:   less than 180 mg/dL (1-2 hours)      Critically ill patients:  140 - 180 mg/dL   Results for Gary Mcneil, KARSTEN (MRN [id number]) as of 05/04/2015 08:47  Ref. Range 05/03/2015 07:48 05/03/2015 11:31 05/03/2015 16:47 05/03/2015 21:43  Glucose-Capillary Latest Ref Range: 65-99 mg/dL 65 998 (H) 338 (H) 250 (H)   Results for [name], Gary Mcneil (MRN [id number]) as of 05/04/2015 08:47  Ref. Range 05/04/2015 03:20  Glucose Latest Ref Range: 65-99 mg/dL 46 (L)   Diabetes history: DM 2 Outpatient Diabetes medications: Glipizide 5mg  Daily, Metformin 500 mg Daily Current orders for Inpatient glycemic control: Levemir 15 units Daily, Novolog 0-24 units TID  Inpatient Diabetes Program Recommendations Insulin - Basal: Poor PO intake trending with hypoglycemia with the levemir. Please consider reducing dose to Levemir 10 units Daily.    Thanks,  Christena Deem RN, MSN, [facility] Inpatient Diabetes Coordinator Team Pager [phone]

## 2015-05-04 NOTE — Progress Notes (Signed)
Pt up in chair per patient request.

## 2015-05-04 NOTE — Progress Notes (Addendum)
      [address]       [place] [address]             [phone]      4 Days Post-Op Procedure(s) (LRB): VIDEO BRONCHOSCOPY (N/A) VIDEO ASSISTED THORACOSCOPY (VATS)/DRAIN EMPYEMA (Right)   Subjective:  [name] complains of pain at chest tube sites.  He is not ambulating despite persistent encouragement from nursing staff.  + BM  Objective: Vital signs in last 24 hours: Temp:  [97.7 F (36.5 C)-98.8 F (37.1 C)] 98.2 F (36.8 C) (06/14 0345) Pulse Rate:  [84-98] 84 (06/14 0345) Cardiac Rhythm:  [-] Normal sinus rhythm (06/14 0345) Resp:  [15-20] 18 (06/14 0345) BP: (106-152)/(63-100) 152/63 mmHg (06/14 0345) SpO2:  [95 %-99 %] 95 % (06/14 0345)  Intake/Output from previous day: 06/13 0701 - 06/14 0700 In: 1310 [P.O.:720; I.V.:440; IV Piggyback:150] Out: 400 [Urine:400]  General appearance: alert, cooperative and no distress Heart: regular rate and rhythm Lungs: diminished breath sounds right base Abdomen: soft, non-tender; bowel sounds normal; no masses,  no organomegaly Wound: clean and dry  Lab Results:  Recent Labs  05/02/15 0400 05/03/15 0545  WBC 15.8* 11.8*  HGB 10.3* 9.6*  HCT 28.6* 26.4*  PLT 351 349   BMET:  Recent Labs  05/02/15 0400 05/03/15 0545  NA 131* 135  K 3.6 3.7  CL 98* 100*  CO2 26 27  GLUCOSE 90 61*  BUN 10 13  CREATININE 0.68 0.74  CALCIUM 8.1* 8.2*    PT/INR: No results for input(s): LABPROT, INR in the last 72 hours. ABG    Component Value Date/Time   PHART 7.408 05/01/2015 0406   HCO3 23.5 05/01/2015 0406   TCO2 25 05/01/2015 0406   ACIDBASEDEF 1.0 05/01/2015 0406   O2SAT 95.0 05/01/2015 0406   CBG (last 3)   Recent Labs  05/03/15 1131 05/03/15 1647 05/03/15 2143  GLUCAP 120* 178* 109*    Assessment/Plan: S/P Procedure(s) (LRB): VIDEO BRONCHOSCOPY (N/A) VIDEO ASSISTED THORACOSCOPY (VATS)/DRAIN EMPYEMA (Right)  1. CV- NSR, Hypertensive- will start Norvasc at 5 mg daily 2. Pulm- off  oxygen, no chest tube output recorded, CXR is mildly improved, chest tube management per staff 3. ID- + empyema, OR cultures negative so far, on Vanc, Fortaz 4. Dispo- patient stable, chest tube management per staff, not ambulating, will get PT consult   LOS: 10 days    Lowella Dandy 05/04/2015  CXR is improving He will need SNF at DC Remove anterior chest tube today  patient examined and medical record reviewed,agree with above note. Kathlee Nations Trigt III 05/04/2015

## 2015-05-05 ENCOUNTER — Inpatient Hospital Stay (HOSPITAL_COMMUNITY): Payer: Medicare Other

## 2015-05-05 LAB — ANAEROBIC CULTURE

## 2015-05-05 LAB — GLUCOSE, CAPILLARY
Glucose-Capillary: 100 mg/dL — ABNORMAL HIGH (ref 65–99)
Glucose-Capillary: 103 mg/dL — ABNORMAL HIGH (ref 65–99)
Glucose-Capillary: 154 mg/dL — ABNORMAL HIGH (ref 65–99)
Glucose-Capillary: 166 mg/dL — ABNORMAL HIGH (ref 65–99)

## 2015-05-05 MED ORDER — METFORMIN HCL 500 MG PO TABS
500.0000 mg | ORAL_TABLET | Freq: Every day | ORAL | Status: DC
  Filled 2015-05-05: qty 1

## 2015-05-05 MED ORDER — AMLODIPINE BESYLATE 10 MG PO TABS
10.0000 mg | ORAL_TABLET | Freq: Every day | ORAL | Status: DC
  Administered 2015-05-07: 10 mg via ORAL
  Filled 2015-05-05 (×2): qty 1

## 2015-05-05 MED ORDER — GLIPIZIDE ER 5 MG PO TB24
5.0000 mg | ORAL_TABLET | Freq: Every day | ORAL | Status: DC
  Filled 2015-05-05: qty 1

## 2015-05-05 MED ORDER — LINAGLIPTIN 5 MG PO TABS: 5.0000 mg | ORAL_TABLET | Freq: Every day | ORAL | Status: DC

## 2015-05-05 MED ORDER — GLIPIZIDE ER 5 MG PO TB24
5.0000 mg | ORAL_TABLET | Freq: Every day | ORAL | Status: DC
  Administered 2015-05-06 – 2015-05-07 (×2): 5 mg via ORAL
  Filled 2015-05-05 (×3): qty 1

## 2015-05-05 NOTE — Progress Notes (Addendum)
      [address]       [place] [address]             [phone]       5 Days Post-Op Procedure(s) (LRB): VIDEO BRONCHOSCOPY (N/A) VIDEO ASSISTED THORACOSCOPY (VATS)/DRAIN EMPYEMA (Right)  Subjective: Pain at chest tube site  Objective: Vital signs in last 24 hours: Temp:  [97.8 F (36.6 C)-98.6 F (37 C)] 98.4 F (36.9 C) (06/15 0800) Pulse Rate:  [81-98] 81 (06/14 2024) Cardiac Rhythm:  [-] Normal sinus rhythm (06/14 2000) Resp:  [13-16] 16 (06/14 2325) BP: (130-157)/(75-100) 157/80 mmHg (06/15 0800) SpO2:  [96 %-98 %] 96 % (06/15 0800)     Intake/Output from previous day: 06/14 0701 - 06/15 0700 In: 50 [IV Piggyback:50] Out: 1450 [Urine:1450]   Physical Exam:  Cardiovascular: Slightly tachycardic Pulmonary: Slightly diminished at bases; no rales, wheezes, or rhonchi. Abdomen: Soft, non tender, bowel sounds present. Extremities: No lower extremity edema. Wounds: Clean and dry.  No erythema or signs of infection. Chest Tube: to water seal and no air leak  Lab Results: CBC: Recent Labs  05/03/15 0545 05/04/15 0320  WBC 11.8* 11.3*  HGB 9.6* 9.1*  HCT 26.4* 25.3*  PLT 349 354   BMET:  Recent Labs  05/03/15 0545 05/04/15 0320  NA 135 136  K 3.7 3.6  CL 100* 101  CO2 27 28  GLUCOSE 61* 46*  BUN 13 13  CREATININE 0.74 0.69  CALCIUM 8.2* 8.2*    PT/INR: No results for input(s): LABPROT, INR in the last 72 hours. ABG:  INR: Will add last result for INR, ABG once components are confirmed Will add last 4 CBG results once components are confirmed  Assessment/Plan:  1. CV - ST in the low 100's. On Norvasc 5 mg daily as taken pre op. Still hypertensive so will increase Norvasc. If still with hypertension, will consider restarting HCTZ.  2.  Pulmonary - On room air. Anterior chest tube removed yesterday. Remaining chest tube with scant output. Chest tube is to water seal and there is no air leak. CXR shows patient is rotated to  the left, there is no pneumothorax, residual right base opacity. Will remove remaining chest tube. Encourage incentive spirometer 3. ID- On Vanco and Fortaz. Culture showed RARE MICROAEROPHILIC STREPTOCOCCI on 6/10 . Will likely change to oral antibiotic soon 4. DM-CBGs 163/131/108. On Insulin. Tolerating diet better and creatinine WNL. Will restart Glipizide. Will restart Metformin as able. Stop scheduled Insulin. 5. Continue to encourage ambulation 6. Will need SNF when ready for discharge  ZIMMERMAN,DONIELLE MPA-C 05/05/2015,9:45 AM patient examined and medical record reviewed,agree with above note. Kathlee Nations Trigt III 05/06/2015

## 2015-05-05 NOTE — Progress Notes (Signed)
Physical Therapy Treatment Patient Details Name: LABRADFORD SCHNITKER MRN: [id number] DOB: [birth year] Today's Date: 05/05/2015    History of Present Illness 74 y.o. with PMHx of diabetes, mild dementia, HTN, HLD Dx on 5/31 with a right lung mass. He was found to have a CBG of 13 on 6/3    PT Comments    Pt progressing with mobility, ambulated 300' with RW and supervision, good pace and no LOB. Will work on progressing away from AD with ambulation. PT will continue to follow.   Follow Up Recommendations  Supervision/Assistance - 24 hour     Equipment Recommendations  None recommended by PT    Recommendations for Other Services       Precautions / Restrictions Precautions Precautions: Fall Precaution Comments: RN reports pt has some confusion, keep chair alarm activated Restrictions Weight Bearing Restrictions: No Other Position/Activity Restrictions: right chest tube    Mobility  Bed Mobility               General bed mobility comments: pt received in chair  Transfers Overall transfer level: Needs assistance Equipment used: Rolling walker (2 wheeled) Transfers: Sit to/from Stand Sit to Stand: Supervision         General transfer comment: supervision, pt stood safely with appropriate placement of hands, lines had to be managed as pt with decreased awareness of this  Ambulation/Gait Ambulation/Gait assistance: Supervision Ambulation Distance (Feet): 300 Feet Assistive device: Rolling walker (2 wheeled) Gait Pattern/deviations: Step-through pattern;Narrow base of support Gait velocity: fast paced Gait velocity interpretation: at or above normal speed for age/gender General Gait Details: pt safe with RW, no cross over gait noted, try less restrictive AD next session, especially since CT should be removed later today. No LOB with RW. Needed frequent vc's for direction within unit.    Stairs            Wheelchair Mobility    Modified Rankin (Stroke  Patients Only)       Balance Overall balance assessment: Needs assistance Sitting-balance support: Feet supported Sitting balance-Leahy Scale: Good     Standing balance support: No upper extremity supported;During functional activity Standing balance-Leahy Scale: Fair Standing balance comment: static stance without support safely                    Cognition Arousal/Alertness: Awake/alert Behavior During Therapy: WFL for tasks assessed/performed Overall Cognitive Status: History of cognitive impairments - at baseline       Memory: Decreased short-term memory              Exercises Total Joint Exercises Knee Flexion: 10 reps;Both;Standing Standing Hip Extension: 10 reps;Both;Standing General Exercises - Lower Extremity Hip Flexion/Marching: Standing;10 reps;Both    General Comments        Pertinent Vitals/Pain Pain Assessment: No/denies pain  HR 110 bpm with ambulation    Home Living                      Prior Function            PT Goals (current goals can now be found in the care plan section) Acute Rehab PT Goals Patient Stated Goal: go home PT Goal Formulation: With patient Time For Goal Achievement: 05/17/15 Potential to Achieve Goals: Good Progress towards PT goals: Progressing toward goals    Frequency  Min 3X/week    PT Plan Current plan remains appropriate    Co-evaluation  End of Session Equipment Utilized During Treatment: Gait belt Activity Tolerance: Patient tolerated treatment well Patient left: in chair;with chair alarm set;with call bell/phone within reach     Time: 1011-1031 PT Time Calculation (min) (ACUTE ONLY): 20 min  Charges:  $Gait Training: 8-22 mins                    G Codes:     Lyanne Co, PT  Acute Rehab Services  [phone]   Lyanne Co 05/05/2015, 12:14 PM

## 2015-05-05 NOTE — Discharge Instructions (Signed)
ACTIVITY:  1.Increase activity slowly. 2.Walk daily and increase frequency and duration as tolerates. 3.May walk up steps. 4.No lifting more than ten pounds for two weeks. 5.No driving for two weeks. 6.Avoid straining. 7.STOP any activity that causes chest pain, shortness of breath, dizziness,sweating,     or excessive weakness. 8.Continue with breathing exercises daily.  DIET:  Diabetic diet and Low fat, Low saltl diet   WOUND:  1.May shower. 2.Clean wounds with mild soap and water.  Call the office at [phone] if any  problems arise.  Thoracotomy, Care After Refer to this sheet in the next few weeks. These instructions provide you with information on caring for yourself after your procedure. Your health care provider may also give you more specific instructions. Your treatment has been planned according to current medical practices, but problems sometimes occur. Call your health care provider if you have any problems or questions after your procedure. WHAT TO EXPECT AFTER YOUR PROCEDURE After your procedure, it is typical to have the following sensations:  You may feel pain at the incision site.  You may be constipated from the pain medicine given and the change in your level of activity.  You may feel extremely tired. HOME CARE INSTRUCTIONS  Take over-the-counter or prescription medicines for pain, discomfort, or fever only as directed by your health care provider. It is very important to take pain relieving medicine before your pain becomes severe. You will be able to breathe and cough more comfortably if your pain is well controlled.  Take deep breaths. Deep breathing helps to keep your lungs inflated and protects against a lung infection (pneumonia).  Cough frequently. Even though coughing may cause discomfort, coughing is important to clear mucus (phlegm) and expand your lungs. Coughing helps prevent pneumonia. If it hurts to cough, hold a pillow against your chest when  you cough. This may help with the discomfort.  Continue to use an incentive spirometer as directed. The use of an incentive spirometer helps to keep your lungs inflated and protects against pneumonia.  Change the bandages over your incision as needed or as directed by your health care provider.  Remove the bandages over your chest tube site as directed by your health care provider.  Resume your normal diet as directed. It is important to have adequate protein, calories, vitamins, and minerals to promote healing.  Prevent constipation.  Eat high-fiber foods such as whole grain cereals and breads, brown rice, beans, and fresh fruits and vegetables.  Drink enough water and fluids to keep your urine clear or pale yellow. Avoid drinking beverages containing caffeine. Beverages containing caffeine can cause dehydration and harden your stool.  Talk to your health care provider about taking a stool softener or laxative.  Avoid lifting until you are instructed otherwise.  Do not drive until directed by your health care provider.  Do not drive while taking pain medicines (narcotics).  Do not bathe, swim, or use a hot tub until directed by your health care provider. You may shower instead. Gently wash the area of your incision with water and soap as directed. Do not use anything else to clean your incision except as directed by your health care provider.  Do not use any tobacco products including cigarettes, chewing tobacco, or electronic cigarettes.  Avoid secondhand smoke.  Schedule an appointment for stitch (suture) or staple removal as directed.  Schedule and attend all follow-up visits as directed by your health care provider. It is important to keep all your appointments.  Participate in pulmonary rehabilitation as directed by your health care provider.  Do not travel by airplane for 2 weeks after your chest tube is removed. SEEK MEDICAL CARE IF:  You are bleeding from your  wounds.  Your heartbeat seems irregular.  You have redness, swelling, or increasing pain in the wounds.  There is pus coming from your wounds.  There is a bad smell coming from the wound or dressing.  You have a fever or chills.  You have nausea or are vomiting.  You have muscle aches. SEEK IMMEDIATE MEDICAL CARE IF:  You have a rash.  You have difficulty breathing.  You have a reaction or side effect to medicines given.  You have persistent nausea.  You have lightheadedness or feel faint.  You have shortness of breath or chest pain.  You have persistent pain. Document Released: 04/21/2011 Document Revised: 11/11/2013 Document Reviewed: 06/25/2013 [facility] Patient Information 2015 [place], [place]. This information is not intended to replace advice given to you by your health care provider. Make sure you discuss any questions you have with your health care provider.

## 2015-05-05 NOTE — Progress Notes (Signed)
Medicare Important Message given? YES (If response is "NO", the following Medicare IM given date fields will be blank) Date Medicare IM given:05/05/15 Medicare IM given by: Anylah Scheib 

## 2015-05-05 NOTE — Discharge Summary (Signed)
Physician Discharge Summary       [address] [place]       [place] [address]             [phone]    Patient ID: Gary Mcneil MRN: [id number] DOB/AGE: 1941/04/29 74 y.o.  Admit date: 04/24/2015 Discharge date: 05/07/2015  Admission Diagnoses: 1. RLL CAP PNA 2. Right empyema 3. Right pleural effusion 4. Hypoglycemia 5. History of diabetes type 2, controlled 6. History of HLD (hyperlipidemia) 7. History of essential hypertension 8. History of protein-calorie malnutrition, severe 9. History of anemia 10. History of remote tobacco abuse 11. History of GERD 12. History of dementia 13. History of gout  Discharge Diagnoses:  1. RLL  CAP PNA 2. Right empyema 3. Right pleural effusion 4. Hypoglycemia 5. History of diabetes type 2, controlled 6. History of HLD (hyperlipidemia) 7. History of essential hypertension 8. History of protein-calorie malnutrition, severe 9. History of anemia 10. History of remote tobacco abuse 11. History of GERD 12. History of dementia 13. History of gout   Consults: pulmonary/intensive care  Procedure (s):  1. Right thoracentesis by IR on 04/28/2015 2. Video bronchoscopy. 3. Right VATS (video-assisted thoracoscopic surgery) with drainage of  empyema and decortication of right lower lobe by Dr. Donata Clay on 05/01/2015.  Pathology: Pleura, peel, right - MARKED ACUTE INFLAMMATION, ABSCESS, AND ABUNDANT FIBRIN. - NEGATIVE FOR ATYPIA OR MALIGNANCY.  History of Presenting Illness: This is a 74 year old AA male nonsmoker presents with right pleuritic chest pain, weight loss, malaise, and progressive weakness. CT scan of chest ruled out pulmonary embolus but showed a loculated right pleural effusion and probable infiltrate right lower lobe. In addition, some fullness of the right mediastinal soft tissue at the carina. He has had no significant fever but he did have a white count elevation  (20,000) on presentation. He has been placed  on anti-biotics. He has maintained sinus rhythm and normal blood pressures. Subsequent scans that shown resolution of the peritracheal fullness but persistent loculated fluid posteriorly in the right pleural space. His appetite has improved. His pain has improved. Because of the concern of a empyema, a thoracic surgical evaluation was requested. The patient did have a tight thoracentesis on 04/28/2015. Approximately 40 cc of cloudy yellow fluid was removed. Dr. Donata Clay discussed the need for right VAT, thoracotomy, drain empyema, and decortication. Potential risks, benefits, and complications were discussed with the patient and he agreed to proceed with surgery.  Brief Hospital Course:  A line and foley were removed in his post operative course. He remained afebrile and hemodynamically stable. Chest tube output gradually decreased and there was no air leak. Daily chest x rays were obtained and remained stable. Chest tubes were placed to water seal on 05/02/2015. He was put on Vancomycin and [place] post op. Cultures showed few microaerophilic streptococci. WBC gradually decreased. He will  be placed on an oral antibiotic at discharge. He has a history of diabetes and was initially only on Levemir as he previously was not taking po well and had hypoglycemia on admission. As he tolerated a diet better, Glipizide was restarted. Metformin will be restarted at discharge. He was hypertensive and his Norvasc was resumed. Anterior chest tube was removed on 06/14. Remaining chest tube was removed on 05/05/2015. Follow up chest x ray was stable. He is tolerating a diet better and has had a bowel movement. He is ambulating on room air. He is felt surgically stable for discharge to SNF today.  Latest Vital Signs: Blood pressure 136/78, pulse 74, temperature 97.9 F (36.6 C), temperature source Oral, resp. rate 18, height 6' (1.829 m), weight 163 lb 2.3 oz (74 kg), SpO2 96 %.  Physical Exam: Cardiovascular: Slightly  tachycardic Pulmonary: Slightly diminished at bases; no rales, wheezes, or rhonchi. Abdomen: Soft, non tender, bowel sounds present. Extremities: No lower extremity edema. Wounds: Clean and dry. No erythema or signs of infection. Chest Tube: to water seal and no air leak  Discharge Condition:Stable and discharged to SNF.  Recent laboratory studies:  Lab Results  Component Value Date   WBC 11.3* 05/04/2015   HGB 9.1* 05/04/2015   HCT 25.3* 05/04/2015   MCV 81.4 05/04/2015   PLT 354 05/04/2015   Lab Results  Component Value Date   NA 136 05/04/2015   K 3.6 05/04/2015   CL 101 05/04/2015   CO2 28 05/04/2015   CREATININE 0.69 05/04/2015   GLUCOSE 46* 05/04/2015      Diagnostic Studies: EXAM: CHEST 2 VIEW  COMPARISON: Portable chest x-ray of 05/05/2015, and CT chest of 04/26/2015  FINDINGS: The pleuro parenchymal opacity at the lateral right lung base is stable with some elevation of the right hemidiaphragm. The left lung is clear. Mediastinal and hilar contours are unchanged. The heart is within normal limits in size. No pneumothorax is seen.  IMPRESSION: No pneumothorax. No change in pleural and parenchymal opacity at the right lung base.   Electronically Signed  By: Dwyane Dee M.D.  On: 05/06/2015 08:00  Ct Chest Wo Contrast  04/26/2015   CLINICAL DATA:  74 year old with a suspicious right paratracheal lung lesion and scheduled for CT-guided lung biopsy.  EXAM: CT CHEST WITHOUT CONTRAST  TECHNIQUE: Multidetector CT imaging of the chest was performed following the standard protocol without IV contrast.  COMPARISON:  CTA 04/20/2015  FINDINGS: Informed consent was obtained for a CT-guided lung biopsy. The patient was placed prone and CT images were obtained of the chest. The right paratracheal collection is no longer present and this likely represented loculated pleural fluid. There continues to be complex or loculated pleural fluid along the lateral right chest.  The amount of pleural fluid in the right lateral chest has slightly increased. Overall, there does not appear to be significant mediastinal lymphadenopathy on this non contrast exam.  Again noted are emphysematous changes. There is volume loss and consolidation in the right lower lobe and there is now a air-fluid collection in the right lower lobe near the loculated pleural fluid. This could represent a focus of necrotic lung.  No acute bone abnormalities.  IMPRESSION: The right paratracheal structure has resolved and likely represented loculated pleural fluid.  There continues to be complex pleural fluid along the lateral right chest with new gas collections in the right lower lobe. The gas collections could represent necrotic lung. Findings are concerning for empyema and possible lung abscess. These structures could be better characterized with a post contrast CT.  These results were called by telephone at the time of interpretation on 04/26/2015 at 3:03 pm to Dr. [name], who verbally acknowledged these results.   Electronically Signed   By: Richarda Overlie M.D.   On: 04/26/2015 15:07   Ct Angio Chest Pe W/cm &/or Wo Cm  04/20/2015   CLINICAL DATA:  74 year old male with chest pain and cough  EXAM: CT ANGIOGRAPHY CHEST WITH CONTRAST  TECHNIQUE: Multidetector CT imaging of the chest was performed using the standard protocol during bolus administration of intravenous contrast. Multiplanar CT image reconstructions  and MIPs were obtained to evaluate the vascular anatomy.  CONTRAST:  80mL OMNIPAQUE IOHEXOL 350 MG/ML SOLN  COMPARISON:  Chest x-ray obtained earlier today ; prior chest x-ray 12/30/2014; prior chest CT 10/16/2005  FINDINGS: Mediastinum: Low right paratracheal lymph node measures 1.4 cm in short axis. Masslike consolidation centered in the posteromedial aspect of the right upper lobe partially encircles the trachea and abuts the esophagus within the middle mediastinum. A faint tissue plane remains present  between the esophagus and the soft tissue mass. The esophagus itself is otherwise unremarkable. No anterior mediastinal mass. Thoracic inlet and thyroid gland are unremarkable.  Heart/Vascular: Adequate opacification of the pulmonary arteries to the proximal subsegmental level. No evidence of central filling defect to suggest acute pulmonary embolus. The main pulmonary artery is enlarged measuring approximately 4.2 cm in diameter. This suggests underlying pulmonary arterial hypertension. Atherosclerotic calcifications are present along the course of the right and left anterior descending coronary arteries. The heart is within normal limits for size. No pericardial effusion. Normal caliber 3 vessel aorta.  Lungs/Pleura: Somewhat amorphous elongated region of masslike consolidation in the posteromedial aspect of the right upper lobe extending from the apex down along the trachea to the right hilum. This masslike consolidation appears to extend into the adjacent mediastinal fat. The mass is largest in craniocaudal dimension were in measures 9.2 cm. On the axial images the mass measures up to 5.6 x 4.4 cm. There is a partially loculated pleural effusion with a loculated fluid extending into the minor fissure. Associated right lower lobe atelectasis with volume loss and slight right-to-left shift of the mediastinal structures. Small volume superimposed consolidation in the inferior right lower lobe. Background of paraseptal emphysema. The left lung is clear.  Bones/Soft Tissues: No evidence of acute fracture. Nonspecific 1 cm (best measured on coronal image 91 of series 7) sclerotic focus in the posterior aspect of the T8 vertebral body just left of midline.  Upper Abdomen: Incompletely imaged water attenuation lesion in the upper pole of the left kidney likely represents a simple cyst. Otherwise, the visualized upper abdomen is unremarkable. The new  Review of the MIP images confirms the above findings.  IMPRESSION: 1.  Negative for pulmonary embolism. 2. Masslike consolidation extending from the right lung apex inferiorly along the mediastinum to the hilum. This region measures up to 9.2 cm in maximal dimension and is concerning for primary bronchogenic carcinoma. A masslike region of pneumonia, or other infectious/inflammatory process is possible but considered less likely. Recommend further evaluation with bronchoscopy. If the bronchoscopic and clinical findings are also consistent with malignancy, staging PET-CT may be warranted. 3. Nonspecific 1 cm sclerotic focus in the T8 vertebral body concerning for possible osseous metastatic disease. 4. Mildly enlarged low right paratracheal lymph node concerning for metastatic disease versus reactive adenopathy. 5. Small to moderate loculated right pleural effusion is presumed malignant or parapneumonic. 6. Right lower lobe atelectasis with a small amount of superimposed consolidation which may represent pneumonia or pneumonitis. 7. Atherosclerotic calcification along the course of the coronary arteries. Please note that although the presence of coronary artery calcium documents the presence of coronary artery disease, the severity of this disease and any potential stenosis cannot be assessed on this non-gated CT examination. Assessment for potential risk factor modification, dietary therapy or pharmacologic therapy may be warranted, if clinically indicated. 8. Enlarged main pulmonary artery as can be seen in the setting of pulmonary arterial hypertension.   Electronically Signed   By: Isac Caddy.D.  On: 04/20/2015 08:07   US Abdomen Limited  04/20/2015   CLINICAL DATA:  Abdominal pain.  Chest pain since 3 a.m.  EXAM: US ABDOMEN LIMITED - RIGHT UPPER QUADRANT  COMPARISON:  Chest CT 04/20/2015  FINDINGS: Gallbladder:  Multiple echogenic foci in the gallbladder are compatible with stones and sludge. Gallbladder wall is not thickened, measuring up to 2 mm. The patient does not  have a sonographic Murphy's sign.  Common bile duct:  Diameter: 3 mm  Liver:  No focal lesion identified. Within normal limits in parenchymal echogenicity. Main portal vein is patent.  IMPRESSION: Cholelithiasis.  No biliary dilatation.   Electronically Signed   By: Richarda Overlie M.D.   On: 04/20/2015 09:26   US Thoracentesis Asp Pleural Space W/img Guide  04/28/2015   CLINICAL DATA:  74 year old with complex right pleural effusion and concern for an empyema.  EXAM: ULTRASOUND GUIDED RIGHT THORACENTESIS  COMPARISON:  CT 04/26/2015  PROCEDURE: An ultrasound guided thoracentesis was thoroughly discussed with the patient and questions answered. The benefits, risks, alternatives and complications were also discussed. The patient understands and wishes to proceed with the procedure. Written consent was obtained.  Ultrasound was performed to localize and mark an adequate pocket of fluid in the right lateral chest. The area was then prepped and draped in the normal sterile fashion. 1% Lidocaine was used for local anesthesia. Under ultrasound guidance a 19 gauge Yueh catheter was introduced. Thoracentesis was performed. The catheter was removed and a dressing applied.  COMPLICATIONS: None.  FINDINGS: Approximately 40 mL of cloudy yellow fluid was removed. In addition, gas was being aspirated. When only gas could be aspirated, the thoracentesis was stopped.  Estimated blood loss:  Minimal  IMPRESSION: Successful ultrasound guided right thoracentesis yielding 40 mL of pleural fluid.   Electronically Signed   By: Richarda Overlie M.D.   On: 04/28/2015 16:13   Discharge Medications:   Follow Up Appointments: Follow-up Information    Follow up with [name] III, MD On 05/26/2015.   Specialty:  Cardiothoracic Surgery   Why:  PA/LAT CXR to be taken (at [facility] which is in the same building as Dr. Zenaida Niece Trigt's ofice) on 05/26/2015 at 1:45 pm ;Appointment time is at  2:30 pm   Contact information:   [address] [place] [address] [phone]       Follow up On 05/13/2015.   Why:  Appointment is with nurse only for chest tube suture removal. Appointment time is at 9:30 am      Follow up with Janyth Contes, JESSICA K, NP.   Specialty:  Nurse Practitioner   Why:  please set up appointment for next 2-4 weeks   Contact information:   [address] [place] [address] [phone]       Signed: Marrion Coy 05/07/2015, 8:26 AM

## 2015-05-06 ENCOUNTER — Inpatient Hospital Stay (HOSPITAL_COMMUNITY): Payer: Medicare Other

## 2015-05-06 LAB — GLUCOSE, CAPILLARY
Glucose-Capillary: 128 mg/dL — ABNORMAL HIGH (ref 65–99)
Glucose-Capillary: 118 mg/dL — ABNORMAL HIGH (ref 65–99)
Glucose-Capillary: 166 mg/dL — ABNORMAL HIGH (ref 65–99)
Glucose-Capillary: 137 mg/dL — ABNORMAL HIGH (ref 65–99)

## 2015-05-06 NOTE — Progress Notes (Signed)
CM spoke with [name], [phone]) regarding d/c needs. Pt refuses to consider SNF per recommendations by PT. CM communicated to dad/son for [name] safety, 24 hour supervision is needed.  [name] stated dad will d/c to home with him and someone will be with dad 24/7. Pt offered HHRN/PT/OT with choice given and pt was in agreement for [facility] services and chose [employer]. Referral made with Lupita Leash @ [phone]) for HHRN/PT/OT, pt  and [name] made aware. Order will be needed from MD for HHRN/PT/OT @ discharge. No other needs identified @ present time.  Gae Gallop RN,BSN,CM [phone]

## 2015-05-06 NOTE — Progress Notes (Signed)
Pt discussed in LOS meeting. Rowyn Spilde RN,BSN, CM [phone] 

## 2015-05-06 NOTE — Progress Notes (Addendum)
      [address]       [place] [address]             [phone]       6 Days Post-Op Procedure(s) (LRB): VIDEO BRONCHOSCOPY (N/A) VIDEO ASSISTED THORACOSCOPY (VATS)/DRAIN EMPYEMA (Right)  Subjective: Patient with less pain now that chest tubes all removed.  Objective: Vital signs in last 24 hours: Temp:  [98.3 F (36.8 C)-99.1 F (37.3 C)] 98.5 F (36.9 C) (06/16 0336) Pulse Rate:  [74-94] 74 (06/16 0802) Cardiac Rhythm:  [-] Normal sinus rhythm (06/15 2000) Resp:  [18-24] 18 (06/16 0802) BP: (123-144)/(64-99) 135/72 mmHg (06/16 0802) SpO2:  [96 %-97 %] 97 % (06/16 0802)     Intake/Output from previous day: 06/15 0701 - 06/16 0700 In: 1140 [P.O.:840; IV Piggyback:300] Out: 800 [Urine:800]   Physical Exam:  Cardiovascular: Slightly tachycardic Pulmonary: Slightly diminished at right base; no rales, wheezes, or rhonchi. Abdomen: Soft, non tender, bowel sounds present. Extremities: No lower extremity edema. Wounds: Clean and dry.  No erythema or signs of infection.   Lab Results: CBC:  Recent Labs  05/04/15 0320  WBC 11.3*  HGB 9.1*  HCT 25.3*  PLT 354   BMET:   Recent Labs  05/04/15 0320  NA 136  K 3.6  CL 101  CO2 28  GLUCOSE 46*  BUN 13  CREATININE 0.69  CALCIUM 8.2*    PT/INR: No results for input(s): LABPROT, INR in the last 72 hours. ABG:  INR: Will add last result for INR, ABG once components are confirmed Will add last 4 CBG results once components are confirmed  Assessment/Plan:  1. CV - SR in the 70's. No longer hypertensive. On Norvasc 10 mg daily. 2.  Pulmonary - On room air. CXR shows no pneumothorax, residual, stable right base opacity. Encourage incentive spirometer 3. ID- On Vanco and Fortaz. Culture showed RARE MICROAEROPHILIC STREPTOCOCCI on 6/10 . Will discuss transition to oral antibiotic with Dr. Donata Clay. 4. DM-CBGs 154/166/128. On Glipizide. Will restart Metformin at discharge. 5. Continue to  encourage ambulation 6. Will need SNF when ready for discharge. Will discuss with Dr. Donata Clay if able to go later today or in am.  ZIMMERMAN,DONIELLE MPA-C 05/06/2015,8:27 AM  Patient continues to progress after VATS-decortication of empyema. Chest x-ray shows resolution of empyema and no pneumothorax following chest tube removal He is ready for transfer to skilled nursing facility and will remain on oral anabioticr Ceftin for 2 weeks.  patient examined and medical record reviewed,agree with above note. Kathlee Nations Trigt III 05/06/2015

## 2015-05-06 NOTE — Progress Notes (Signed)
Physical Therapy Treatment Patient Details Name: DAMARIOUS GOODGION MRN: [id number] DOB: [birth year] Today's Date: 05/06/2015    History of Present Illness 74 y.o. with PMHx of diabetes, mild dementia, HTN, HLD Dx on 5/31 with a right lung mass. He was found to have a CBG of 13 on 6/3    PT Comments    Progressing well .  Ready for home with available assist.  Follow Up Recommendations  Supervision/Assistance - 24 hour     Equipment Recommendations  None recommended by PT    Recommendations for Other Services       Precautions / Restrictions Precautions Precautions:  (minimal risk for fall)    Mobility  Bed Mobility                  Transfers Overall transfer level: Needs assistance Equipment used: None Transfers: Sit to/from Stand Sit to Stand: Supervision            Ambulation/Gait Ambulation/Gait assistance: Supervision Ambulation Distance (Feet): 660 Feet Assistive device: None Gait Pattern/deviations: Step-through pattern Gait velocity: fast paced Gait velocity interpretation: at or above normal speed for age/gender General Gait Details: safe without assistive device in homelike environment   Stairs            Wheelchair Mobility    Modified Rankin (Stroke Patients Only)       Balance Overall balance assessment: Needs assistance   Sitting balance-Leahy Scale: Good     Standing balance support: No upper extremity supported Standing balance-Leahy Scale: Good                      Cognition Arousal/Alertness: Awake/alert Behavior During Therapy: WFL for tasks assessed/performed Overall Cognitive Status: Within Functional Limits for tasks assessed                      Exercises      General Comments General comments (skin integrity, edema, etc.): EHR up to 124, but averaging in the 110's bpm      Pertinent Vitals/Pain Pain Assessment: Faces Pain Score: 1  Faces Pain Scale: Hurts a little bit Pain Location:  chest incision Pain Descriptors / Indicators: Grimacing Pain Intervention(s): Monitored during session    Home Living                      Prior Function            PT Goals (current goals can now be found in the care plan section) Acute Rehab PT Goals Patient Stated Goal: go home PT Goal Formulation: With patient Time For Goal Achievement: 05/17/15 Potential to Achieve Goals: Good Progress towards PT goals: Progressing toward goals    Frequency  Min 3X/week    PT Plan Current plan remains appropriate    Co-evaluation             End of Session   Activity Tolerance: Patient tolerated treatment well Patient left: in chair;with call bell/phone within reach     Time: 1700-1712 PT Time Calculation (min) (ACUTE ONLY): 12 min  Charges:  $Gait Training: 8-22 mins                    G Codes:      Johsua Shevlin, Shadeed Boardwine 05/06/2015, 5:16 PM 05/06/2015  Chester Bing, PT [phone]  (pager)

## 2015-05-06 NOTE — Progress Notes (Signed)
Pharmacist called and recommended Ceftin 500 mg bid as oral antibiotic. Will order at discharge.

## 2015-05-07 ENCOUNTER — Other Ambulatory Visit: Payer: Self-pay | Admitting: Internal Medicine

## 2015-05-07 ENCOUNTER — Inpatient Hospital Stay (HOSPITAL_COMMUNITY): Payer: Medicare Other

## 2015-05-07 LAB — GLUCOSE, CAPILLARY
Glucose-Capillary: 109 mg/dL — ABNORMAL HIGH (ref 65–99)
Glucose-Capillary: 85 mg/dL (ref 65–99)

## 2015-05-07 MED ORDER — OXYCODONE HCL 5 MG PO TABS: 5.0000 mg | ORAL_TABLET | ORAL | Status: DC | PRN

## 2015-05-07 MED ORDER — ATORVASTATIN CALCIUM 20 MG PO TABS: 20.0000 mg | ORAL_TABLET | Freq: Every day | ORAL | Status: DC

## 2015-05-07 MED ORDER — CEFUROXIME AXETIL 500 MG PO TABS: 500.0000 mg | ORAL_TABLET | Freq: Two times a day (BID) | ORAL | Status: DC

## 2015-05-07 NOTE — Progress Notes (Signed)
Pts daughter in law- Conservation officer, nature- at bedside. Discharge paperwork reviewed with Mr and Mrs. Tacker. Prescriptions were given to Mrs. Antelo. All questions answered to the family and patients satisfaction. Mrs. Mcgonagle verbalized understanding of all information and follow up appointments. Mrs. Bertani also verified that someone would be available around the clock to provide supervision to Mr. Zilka. Patient VS stable, pt in noted to not be in any acute distress. Patient discharged via wheelchair.

## 2015-05-07 NOTE — Progress Notes (Addendum)
      [address]       [place] [address]             [phone]      7 Days Post-Op Procedure(s) (LRB): VIDEO BRONCHOSCOPY (N/A) VIDEO ASSISTED THORACOSCOPY (VATS)/DRAIN EMPYEMA (Right)   Subjective:  Mr. Kinzel has no complaints this morning.  He is refusing SNF placement, but does have 24 hour care at home provided by his son and family.  Objective: Vital signs in last 24 hours: Temp:  [97.9 F (36.6 C)-98.9 F (37.2 C)] 97.9 F (36.6 C) (06/17 0445) Cardiac Rhythm:  [-] Normal sinus rhythm (06/17 0737) Resp:  [17-23] 18 (06/17 0737) BP: (96-145)/(57-87) 136/78 mmHg (06/17 0737) SpO2:  [96 %-98 %] 96 % (06/17 0448)  Intake/Output from previous day: 06/16 0701 - 06/17 0700 In: 290 [P.O.:240; IV Piggyback:50] Out: 950 [Urine:950]  General appearance: alert, cooperative and no distress Heart: regular rate and rhythm Lungs: clear to auscultation bilaterally Abdomen: soft, non-tender; bowel sounds normal; no masses,  no organomegaly Wound: clean and dry  Lab Results: No results for input(s): WBC, HGB, HCT, PLT in the last 72 hours. BMET: No results for input(s): NA, K, CL, CO2, GLUCOSE, BUN, CREATININE, CALCIUM in the last 72 hours.  PT/INR: No results for input(s): LABPROT, INR in the last 72 hours. ABG    Component Value Date/Time   PHART 7.408 05/01/2015 0406   HCO3 23.5 05/01/2015 0406   TCO2 25 05/01/2015 0406   ACIDBASEDEF 1.0 05/01/2015 0406   O2SAT 95.0 05/01/2015 0406   CBG (last 3)   Recent Labs  05/06/15 1151 05/06/15 1622 05/06/15 2125  GLUCAP 118* 166* 137*    Assessment/Plan: S/P Procedure(s) (LRB): VIDEO BRONCHOSCOPY (N/A) VIDEO ASSISTED THORACOSCOPY (VATS)/DRAIN EMPYEMA (Right)  1. Chest tube removed, CXR continues to show decrease in fluid level, no pneumothorax present 2. ID- Empyema, OR cultures are positive for Micro Aerophilic Strep- Ceftin will be continued at d/c per pharmacy recs 3. DM- restart home  medications at discharge 4. Dispo- patient stable, will d/c home today   LOS: 13 days    BARRETT, ERIN 05/07/2015  patient examined and medical record reviewed,agree with above note.Will need HHN for restorative care since he refuses SNF Kathlee Nations Trigt III 05/07/2015

## 2015-05-09 DIAGNOSIS — J90 Pleural effusion, not elsewhere classified: Secondary | ICD-10-CM | POA: Diagnosis not present

## 2015-05-12 ENCOUNTER — Other Ambulatory Visit: Payer: Medicare Other

## 2015-05-12 ENCOUNTER — Other Ambulatory Visit: Payer: Self-pay | Admitting: *Deleted

## 2015-05-12 DIAGNOSIS — M109 Gout, unspecified: Secondary | ICD-10-CM

## 2015-05-12 DIAGNOSIS — E785 Hyperlipidemia, unspecified: Secondary | ICD-10-CM

## 2015-05-12 DIAGNOSIS — I10 Essential (primary) hypertension: Secondary | ICD-10-CM

## 2015-05-12 DIAGNOSIS — E876 Hypokalemia: Secondary | ICD-10-CM

## 2015-05-12 DIAGNOSIS — E1149 Type 2 diabetes mellitus with other diabetic neurological complication: Secondary | ICD-10-CM

## 2015-05-13 ENCOUNTER — Other Ambulatory Visit: Payer: Medicare Other

## 2015-05-13 LAB — LIPID PANEL
CHOL/HDL RATIO: 2.5 ratio (ref 0.0–5.0)
Cholesterol, Total: 151 mg/dL (ref 100–199)
HDL: 60 mg/dL (ref >39)
LDL Calculated: 75 mg/dL (ref 0–99)
Triglycerides: 80 mg/dL (ref 0–149)
VLDL Cholesterol Cal: 16 mg/dL (ref 5–40)

## 2015-05-13 LAB — BASIC METABOLIC PANEL
BUN/Creatinine Ratio: 15 (ref 10–22)
BUN: 10 mg/dL (ref 8–27)
CO2: 23 mmol/L (ref 18–29)
Calcium: 8.9 mg/dL (ref 8.6–10.2)
Chloride: 102 mmol/L (ref 97–108)
Creatinine, Ser: 0.65 mg/dL — ABNORMAL LOW (ref 0.76–1.27)
GFR calc non Af Amer: 97 mL/min/{1.73_m2} (ref >59)
GFR, EST AFRICAN AMERICAN: 112 mL/min/{1.73_m2} (ref >59)
GLUCOSE: 132 mg/dL — AB (ref 65–99)
POTASSIUM: 4.2 mmol/L (ref 3.5–5.2)
Sodium: 141 mmol/L (ref 134–144)

## 2015-05-13 LAB — HEMOGLOBIN A1C
Est. average glucose Bld gHb Est-mCnc: 134 mg/dL
Hgb A1c MFr Bld: 6.3 % — ABNORMAL HIGH (ref 4.8–5.6)

## 2015-05-13 LAB — URIC ACID: Uric Acid: 5 mg/dL (ref 3.7–8.6)

## 2015-05-14 ENCOUNTER — Ambulatory Visit: Payer: Medicare Other | Admitting: *Deleted

## 2015-05-14 ENCOUNTER — Other Ambulatory Visit: Payer: Self-pay | Admitting: *Deleted

## 2015-05-14 DIAGNOSIS — G8918 Other acute postprocedural pain: Secondary | ICD-10-CM

## 2015-05-14 DIAGNOSIS — Z4802 Encounter for removal of sutures: Secondary | ICD-10-CM

## 2015-05-14 DIAGNOSIS — J869 Pyothorax without fistula: Secondary | ICD-10-CM

## 2015-05-14 MED ORDER — OXYCODONE HCL 5 MG PO TABS: 5.0000 mg | ORAL_TABLET | ORAL | Status: DC | PRN

## 2015-05-14 NOTE — Progress Notes (Signed)
Gary Mcneil returns s/p decortication and drainage of his right empyema. The right thoracic incision and previous three chest tubes are healing well.  Sutures were removed from the chest tube sites without difficulty.  He is doing well, bowels and appetite are good.  A pain med RX refill was given to him. He will return as scheduled with a cxr.

## 2015-05-17 ENCOUNTER — Ambulatory Visit: Payer: Medicare Other | Admitting: Nurse Practitioner

## 2015-05-18 ENCOUNTER — Encounter: Payer: Self-pay | Admitting: Nurse Practitioner

## 2015-05-18 ENCOUNTER — Ambulatory Visit (INDEPENDENT_AMBULATORY_CARE_PROVIDER_SITE_OTHER): Payer: Medicare Other | Admitting: Nurse Practitioner

## 2015-05-18 VITALS — BP 122/78 | HR 85 | Temp 98.3°F | Resp 20 | Ht 72.0 in | Wt 162.0 lb

## 2015-05-18 DIAGNOSIS — I10 Essential (primary) hypertension: Secondary | ICD-10-CM | POA: Diagnosis not present

## 2015-05-18 DIAGNOSIS — J869 Pyothorax without fistula: Secondary | ICD-10-CM | POA: Diagnosis not present

## 2015-05-18 DIAGNOSIS — M1990 Unspecified osteoarthritis, unspecified site: Secondary | ICD-10-CM

## 2015-05-18 DIAGNOSIS — E1140 Type 2 diabetes mellitus with diabetic neuropathy, unspecified: Secondary | ICD-10-CM

## 2015-05-18 DIAGNOSIS — E785 Hyperlipidemia, unspecified: Secondary | ICD-10-CM

## 2015-05-18 DIAGNOSIS — F0390 Unspecified dementia without behavioral disturbance: Secondary | ICD-10-CM | POA: Diagnosis not present

## 2015-05-18 DIAGNOSIS — E43 Unspecified severe protein-calorie malnutrition: Secondary | ICD-10-CM | POA: Diagnosis not present

## 2015-05-18 NOTE — Progress Notes (Signed)
Patient ID: Gary Mcneil, male   DOB: 1941/11/14, 74 y.o.   MRN: [id number]    PCP: [name], NP  Allergies  Allergen Reactions  . Penicillins Rash    Chief Complaint  Patient presents with  . Medical Management of Chronic Issues    3 month follow-up, labs printed     HPI: Patient is a 74 y.o. male seen in the office today for 3 month follow up/hospital follow up. Pt with a pmh of dementia, DM, hyperlipidemia, GERD, HTN.  Pt was hospitalized from 04/24/15-05/07/15 due to   right pleuritic chest pain, weight loss, malaise, and progressive weakness. CT scan of chest ruled out pulmonary embolus but showed a loculated right pleural effusion and probable infiltrate right lower lobe.Pleura, peel, right - MARKED ACUTE INFLAMMATION, ABSCESS, AND ABUNDANT FIBRIN.  NEGATIVE FOR ATYPIA OR MALIGNANCY noted on report. Right VATS (video-assisted thoracoscopic surgery) with drainage of empyema and decortication of right lower lobe by Dr. Donata Clay on 05/01/2015. Pt reports he has been doing great since he has been home. Pt lives alone, son and his wife checks on pt daily. Continues to follow up with Dr Donata Clay   Has been working with home health 3 times a week. appetite is good.   Review of Systems:  Review of Systems  Constitutional: Negative for fever, chills, activity change and appetite change.  Eyes: Positive for visual disturbance (following with eye doctor).  Respiratory: Negative for cough.   Cardiovascular: Negative for chest pain.  Gastrointestinal: Negative for abdominal pain, diarrhea and constipation.  Genitourinary: Negative for dysuria, urgency and frequency.  Musculoskeletal: Positive for myalgias (some soreness to chest ) and arthralgias.       Walks without assistive devices  Skin: Negative for rash.  Neurological: Negative for dizziness, weakness and headaches.  Psychiatric/Behavioral: The patient is not nervous/anxious.        Dementia    Past Medical History    Diagnosis Date  . Type II or unspecified type diabetes mellitus without mention of complication, uncontrolled   . DEMENTIA   . Neuromuscular disorder   . Hypertension   . Gout   . Arthritis   . Dementia 04/24/2012  . Unspecified hereditary and idiopathic peripheral neuropathy   . Anemia, unspecified   . Hypotension, unspecified   . Acute kidney failure, unspecified   . Unspecified vitamin D deficiency   . Other and unspecified hyperlipidemia   . Hypopotassemia   . Disorder of bone and cartilage, unspecified   . Memory loss   . Hypotension, unspecified   . Acute kidney failure, unspecified   . Muscle weakness (generalized)   . Dizziness and giddiness   . Myalgia and myositis, unspecified   . Screening for ischemic heart disease   . Type II or unspecified type diabetes mellitus without mention of complication, uncontrolled   . Gout, unspecified   . Tobacco use disorder   . Memory loss   . "Walking corpse" syndrome   . GERD (gastroesophageal reflux disease)    Past Surgical History  Procedure Laterality Date  . Inguinal hernia repair    . Video bronchoscopy N/A 04/30/2015    Procedure: VIDEO BRONCHOSCOPY;  Surgeon: [name], MD;  Location: [facility] OR;  Service: Thoracic;  Laterality: N/A;  . Video assisted thoracoscopy (vats)/empyema Right 04/30/2015    Procedure: VIDEO ASSISTED THORACOSCOPY (VATS)/DRAIN EMPYEMA;  Surgeon: [name], MD;  Location: [facility] OR;  Service: Thoracic;  Laterality: Right;   Social History:  reports that he quit smoking about 45 years ago. He has never used smokeless tobacco. He reports that he does not drink alcohol or use illicit drugs.  Family History  Problem Relation Age of Onset  . Diabetes Mother   . Hypertension Mother   . Diabetes Father   . Hypertension Father   . Kidney disease Father   . Diabetes Sister   . Kidney disease Brother     Medications: Patient's Medications  New Prescriptions   No medications on file  Previous  Medications   ALLOPURINOL (ZYLOPRIM) 100 MG TABLET    Take 2 tablets (200 mg total) by mouth daily.   ATORVASTATIN (LIPITOR) 20 MG TABLET    Take 1 tablet (20 mg total) by mouth daily at 6 PM.   CEFUROXIME (CEFTIN) 500 MG TABLET    Take 1 tablet (500 mg total) by mouth 2 (two) times daily with a meal. For 14 Days   DONEPEZIL (ARICEPT) 10 MG TABLET    Start 5 mg (1/2 tablet) daily at bedtime for 1 month then increase to 10 mg (1 tablet) daily at bedtime for memory   GLIPIZIDE (GLUCOTROL XL) 5 MG 24 HR TABLET    Take 5 mg by mouth daily.   HYDROCHLOROTHIAZIDE (HYDRODIURIL) 25 MG TABLET    TAKE 1 TABLET BY MOUTH DAILY   MEMANTINE (NAMENDA) 10 MG TABLET    TAKE 1 TABLET BY MOUTH TWICE DAILY   MULTIPLE VITAMIN (MULTIVITAMIN WITH MINERALS) TABS TABLET    Take 1 tablet by mouth daily.   NAMENDA XR 28 MG CP24 24 HR CAPSULE    TAKE ONE CAPSULE BY MOUTH DAILY   OXYCODONE (OXY IR/ROXICODONE) 5 MG IMMEDIATE RELEASE TABLET    Take 1 tablet (5 mg total) by mouth every 4 (four) hours as needed for severe pain.   POTASSIUM CHLORIDE SA (K-DUR,KLOR-CON) 20 MEQ TABLET    TAKE 2 TABLETS BY MOUTH TWICE DAILY   TRAMADOL (ULTRAM) 50 MG TABLET    TAKE 1-2 TABLETS BY MOUTH EVERY 8 HOURS AS NEEDED  Modified Medications   No medications on file  Discontinued Medications   No medications on file     Physical Exam:  Filed Vitals:   05/18/15 0839  BP: 122/78  Pulse: 85  Temp: 98.3 F (36.8 C)  TempSrc: Oral  Resp: 20  Height: 6' (1.829 m)  Weight: 162 lb (73.483 kg)  SpO2: 97%    Physical Exam  Constitutional: No distress.  HENT:  Head: Normocephalic and atraumatic.  Nose: Nose normal.  Mouth/Throat: Oropharynx is clear and moist. No oropharyngeal exudate.  Neck: Normal range of motion. Neck supple.  Cardiovascular: Normal rate, regular rhythm and normal heart sounds.   Pulmonary/Chest: Effort normal and breath sounds normal.  Abdominal: Soft. Bowel sounds are normal.  Musculoskeletal: Normal range of  motion. He exhibits no edema or tenderness.  Neurological: He is alert. He displays normal reflexes. No cranial nerve deficit.  confused  Skin: Skin is warm and dry. He is not diaphoretic.  Healing incisions from chest tube   Psychiatric: He has a normal mood and affect.    Labs reviewed: Basic Metabolic Panel:  Recent Labs  61/95/09 1032 08/12/14 1050  04/26/15 0259 04/27/15 0304 04/28/15 0255  05/03/15 0545 05/04/15 0320 05/12/15 0951  NA 142 141  < > 136 134* 134*  < > 135 136 141  K 2.9* 2.6*  < > 4.2 4.1 4.0  < > 3.7 3.6 4.2  CL 101 94*  < >  104 103 104  < > 100* 101 102  CO2 21 30*  < > 23 23 21*  < > 27 28 23   GLUCOSE 129* 196*  < > 134* 138* 119*  < > 61* 46* 132*  BUN 11 17  < > 11 15 14   < > 13 13 10   CREATININE 0.91 1.00  < > 0.69 0.76 0.66  < > 0.74 0.69 0.65*  CALCIUM 9.2 9.6  < > 8.5* 8.3* 8.2*  < > 8.2* 8.2* 8.9  MG  --   --   < > 2.0 2.0 1.9  --   --   --   --   TSH 2.650 2.230  --   --   --   --   --   --   --   --   < > = values in this interval not displayed. Liver Function Tests:  Recent Labs  04/28/15 0255 04/29/15 0926 05/02/15 0400  AST 29 22 7*  ALT 42 32 23  ALKPHOS 121 122 83  BILITOT 0.9 1.0 0.7  PROT 6.4* 6.2* 5.7*  ALBUMIN 2.3* 2.3* 2.2*   No results for input(s): LIPASE, AMYLASE in the last 8760 hours. No results for input(s): AMMONIA in the last 8760 hours. CBC:  Recent Labs  04/25/15 1830 04/26/15 0259  04/28/15 0255  05/02/15 0400 05/03/15 0545 05/04/15 0320  WBC 16.0* 15.2*  < > 14.5*  < > 15.8* 11.8* 11.3*  NEUTROABS 13.0* 12.0*  --  11.5*  --   --   --   --   HGB 13.5 13.3  < > 12.9*  < > 10.3* 9.6* 9.1*  HCT 36.7* 36.0*  < > 35.5*  < > 28.6* 26.4* 25.3*  MCV 80.1 80.5  < > 82.0  < > 81.7 80.2 81.4  PLT 306 284  < > 296  < > 351 349 354  < > = values in this interval not displayed. Lipid Panel:  Recent Labs  05/20/14 1032 04/26/15 0259 05/12/15 0944  CHOL 251* 161 151  HDL 61 30* 60  LDLCALC 151* 111* 75    TRIG 197* 101 80  CHOLHDL 4.1 5.4 2.5   TSH:  Recent Labs  05/20/14 1032 08/12/14 1050  TSH 2.650 2.230   A1C: Lab Results  Component Value Date   HGBA1C 6.3* 05/12/2015     Assessment/Plan 1. Essential hypertension -stable, conts on HCTZ and potassium, labs reviewed.   2. Empyema lung S/p VATS and chest tube, followed by Dr Zenaida NieceVan trigt, to complete ceftin for a total of 14 days.    3. Type 2 diabetes mellitus with diabetic neuropathy A1c 6.3, discussed with pt, conts on glipizide   4. Dementia without behavioral disturbance MMSE in April revealing moderate memory loss, conts on aricept and namenda  5. Arthritis Stable, Using pain medications as needed  6. Protein-calorie malnutrition, severe -noted weight loss, encourage ensure supplements   7. HLD (hyperlipidemia) -LDL at goal. conts lipitor   Keep follow up with Dr Donata ClayVan Trigt in July Follow up in office in 3 months, sooner if needed   Chanceler Pullin K. Biagio BorgEubanks, AGNP  [facility] [phone](Monday-Friday 8 am - 5 pm) [phone] (after hours)

## 2015-05-18 NOTE — Patient Instructions (Signed)
Keep follow up with Dr Donata ClayVan Trigt   Noted weight loss, good PO intake-- may use Ensure daily as supplement in addition to meals   Follow up 3 months

## 2015-05-20 ENCOUNTER — Other Ambulatory Visit: Payer: Self-pay | Admitting: Cardiothoracic Surgery

## 2015-05-20 DIAGNOSIS — J869 Pyothorax without fistula: Secondary | ICD-10-CM

## 2015-05-26 ENCOUNTER — Ambulatory Visit: Payer: Medicare Other | Admitting: Cardiothoracic Surgery

## 2015-05-26 ENCOUNTER — Ambulatory Visit
Admission: RE | Admit: 2015-05-26 | Discharge: 2015-05-26 | Disposition: A | Discharge status: Home / Self Care | Payer: Medicare Other | Source: Ambulatory Visit | Attending: Cardiothoracic Surgery | Admitting: Cardiothoracic Surgery

## 2015-05-26 DIAGNOSIS — J869 Pyothorax without fistula: Secondary | ICD-10-CM

## 2015-05-27 ENCOUNTER — Telehealth: Payer: Self-pay | Admitting: *Deleted

## 2015-05-27 LAB — FUNGUS CULTURE W SMEAR: Fungal Smear: NONE SEEN

## 2015-06-13 LAB — AFB CULTURE WITH SMEAR (NOT AT ARMC): Acid Fast Smear: NONE SEEN

## 2015-07-23 ENCOUNTER — Other Ambulatory Visit: Payer: Self-pay | Admitting: Physician Assistant

## 2015-08-17 ENCOUNTER — Encounter: Payer: Self-pay | Admitting: Nurse Practitioner

## 2015-08-17 ENCOUNTER — Ambulatory Visit: Payer: Medicare Other | Admitting: Nurse Practitioner

## 2015-08-24 ENCOUNTER — Ambulatory Visit: Payer: Medicare Other | Admitting: Nurse Practitioner

## 2015-08-31 IMAGING — CR DG CHEST 2V
2 series · 2 of 2 positions shown · non-contrast
Comparison: PA and lateral chest x-ray May 07, 2015

CLINICAL DATA: History of empyema and surgery for same several
months ago, follow-up study, remote history of tobacco use

EXAM:
CHEST  2 VIEW

[view not recorded (1 of 2)]
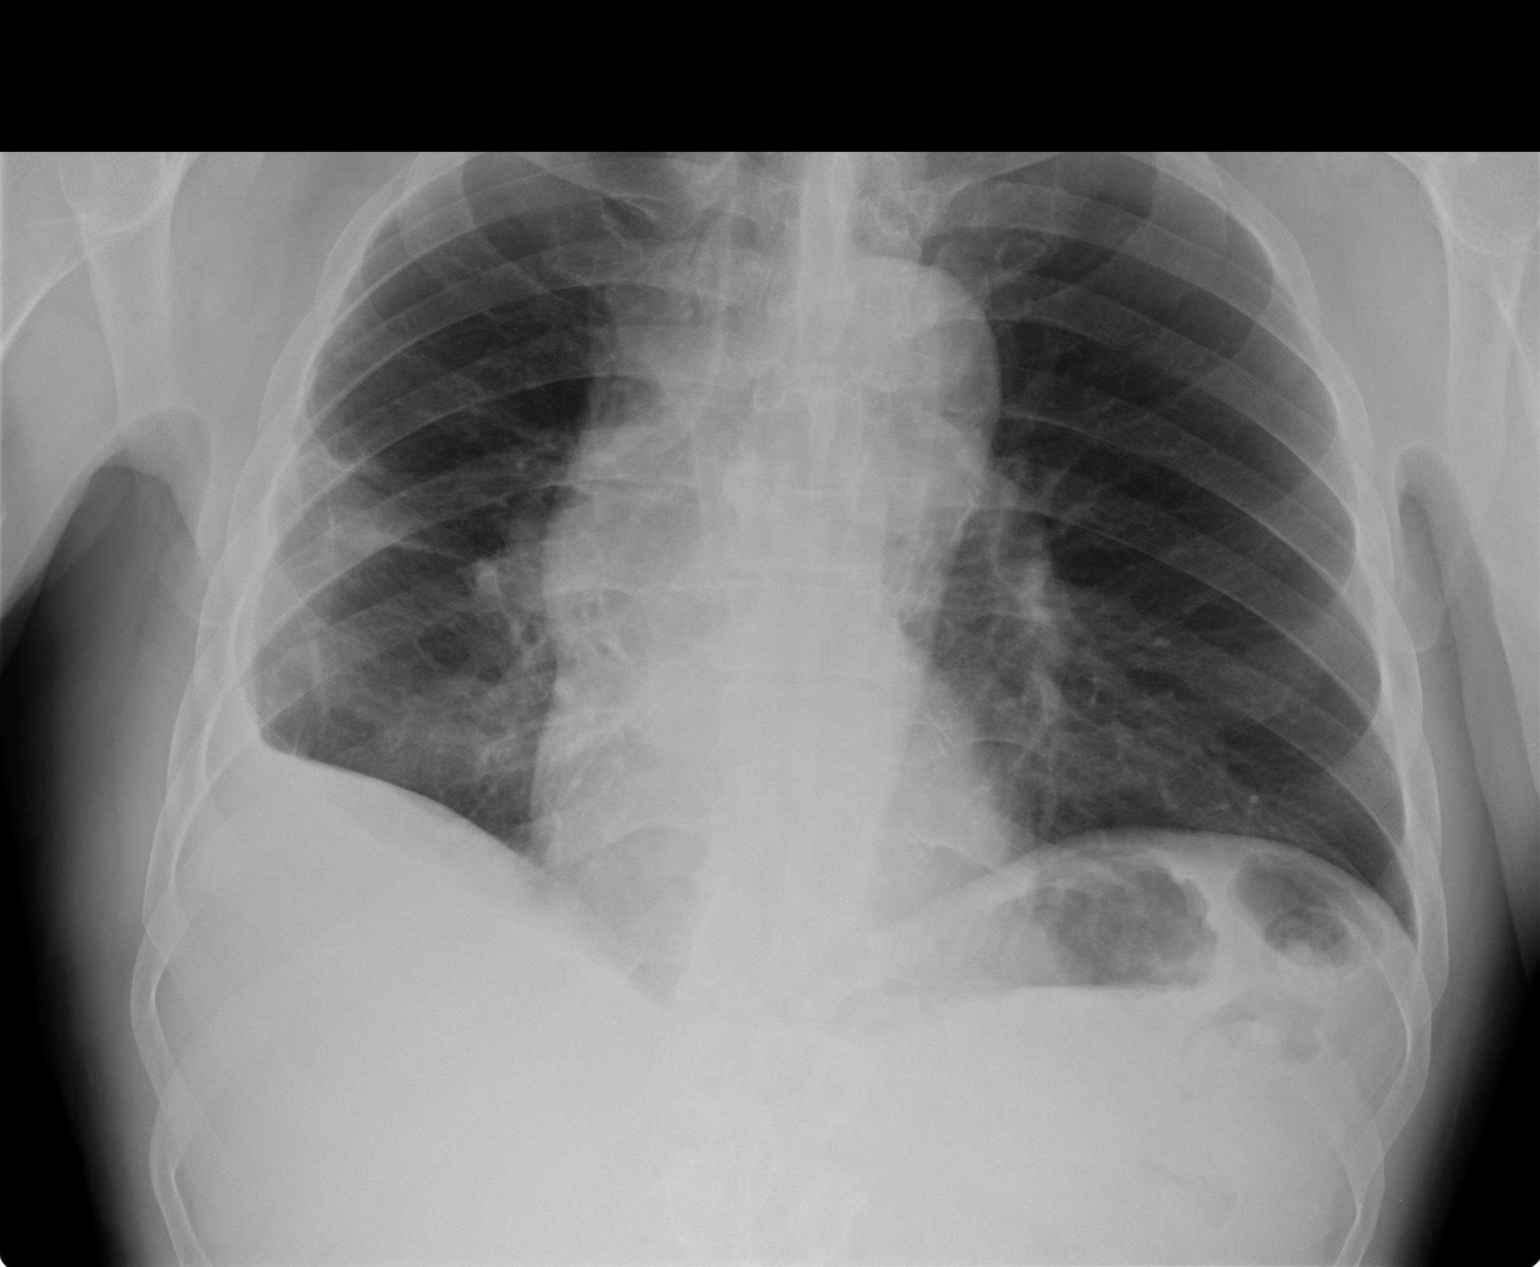

[view not recorded (2 of 2)]
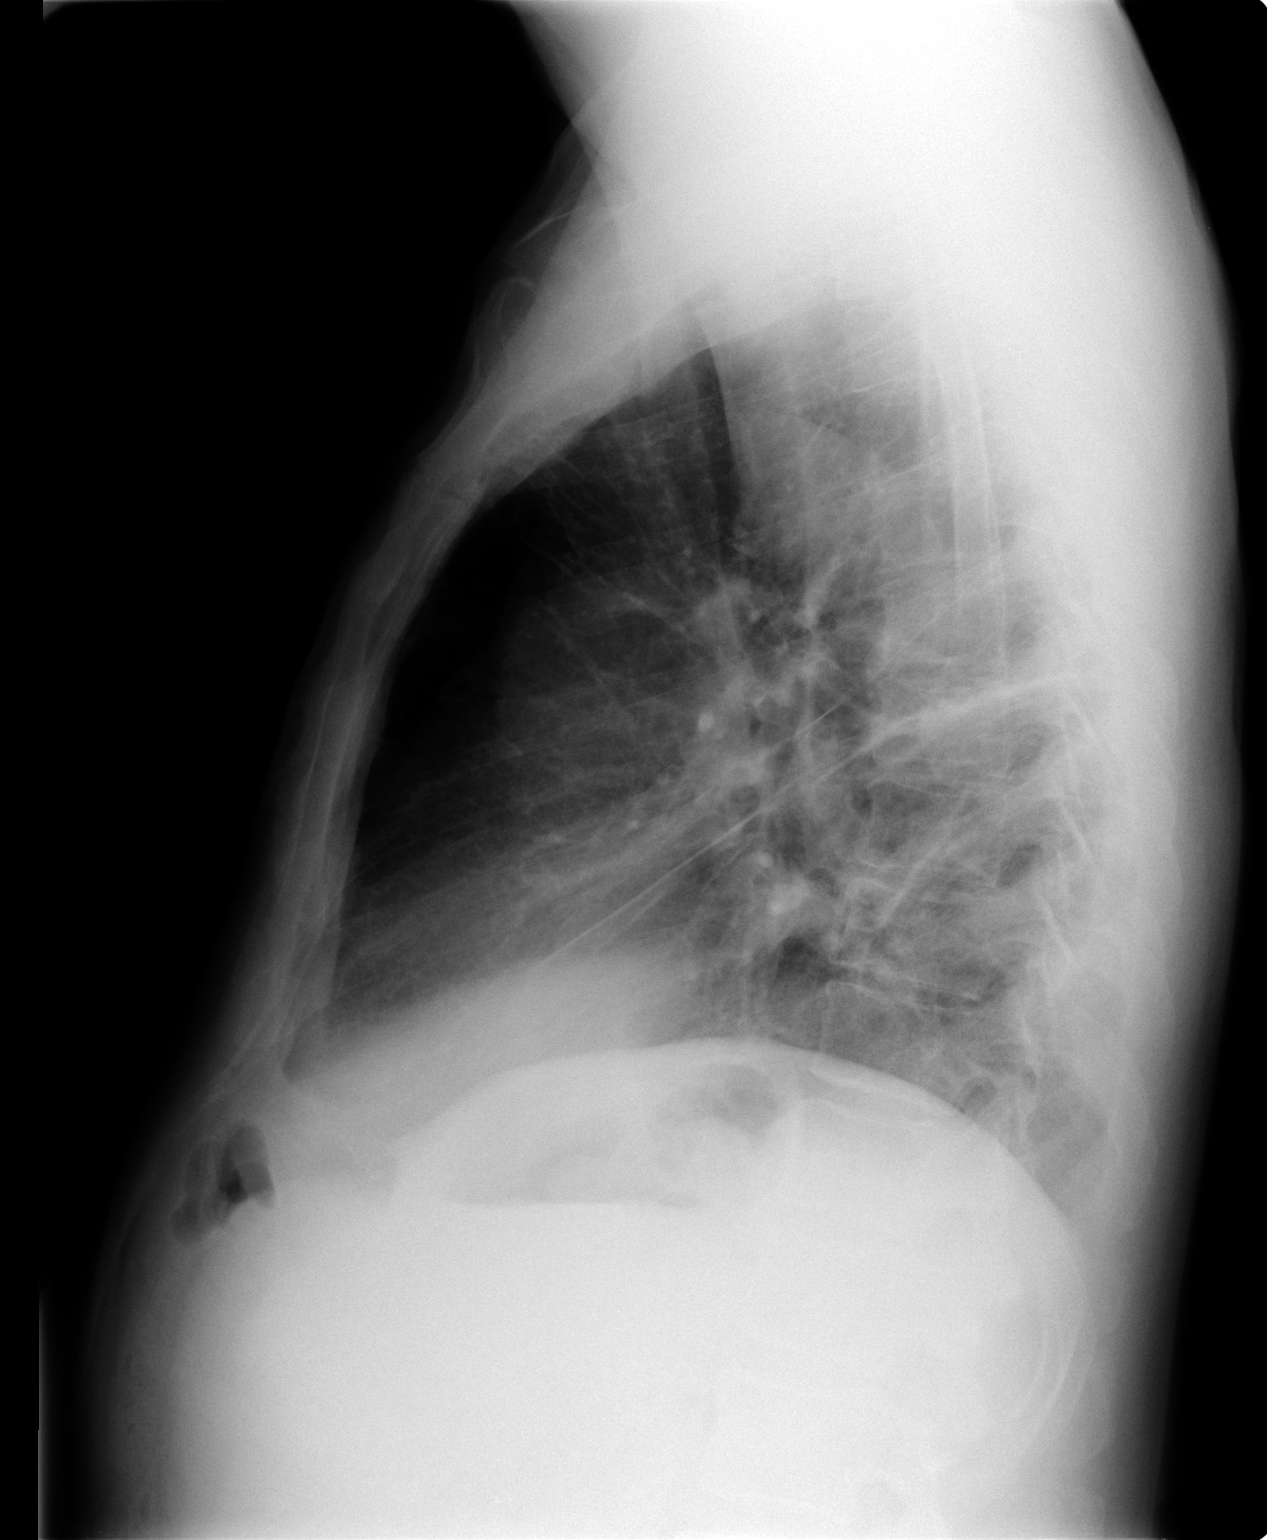

[2 of 2 positions shown; findings below may reference images not displayed]

FINDINGS: There remains volume loss on the right little changed from the
previous study. Mildly increased pulmonary interstitial density
peripherally in the right mid and lower lung is present but slightly
improved over the previous study. The right upper lobe is clear as
is the left lung. The heart is normal in size. There is tortuosity
of the ascending and descending thoracic aorta. The bony thorax
exhibits no acute abnormality.
IMPRESSION: Essentially stable appearance of the empyema on the right. No
air-fluid levels are observed. If the patient is experiencing any
acute symptoms, chest CT scanning now would be recommended.

## 2015-09-16 ENCOUNTER — Other Ambulatory Visit: Payer: Self-pay | Admitting: Physician Assistant

## 2015-09-18 ENCOUNTER — Other Ambulatory Visit: Payer: Self-pay | Admitting: Nurse Practitioner

## 2015-10-05 ENCOUNTER — Other Ambulatory Visit: Payer: Self-pay | Admitting: Nurse Practitioner

## 2015-11-05 ENCOUNTER — Other Ambulatory Visit: Payer: Self-pay | Admitting: Nurse Practitioner

## 2015-11-05 ENCOUNTER — Other Ambulatory Visit: Payer: Self-pay | Admitting: Internal Medicine

## 2015-11-22 ENCOUNTER — Other Ambulatory Visit: Payer: Self-pay | Admitting: Nurse Practitioner

## 2015-12-07 ENCOUNTER — Other Ambulatory Visit: Payer: Self-pay | Admitting: Nurse Practitioner

## 2015-12-25 ENCOUNTER — Other Ambulatory Visit: Payer: Self-pay | Admitting: Nurse Practitioner

## 2016-01-04 ENCOUNTER — Encounter: Payer: Self-pay | Admitting: Nurse Practitioner

## 2016-01-04 ENCOUNTER — Ambulatory Visit (INDEPENDENT_AMBULATORY_CARE_PROVIDER_SITE_OTHER): Payer: Medicare Other | Admitting: Nurse Practitioner

## 2016-01-04 ENCOUNTER — Other Ambulatory Visit: Payer: Self-pay

## 2016-01-04 VITALS — BP 148/90 | HR 75 | Temp 97.7°F | Resp 20 | Ht 72.0 in | Wt 186.6 lb

## 2016-01-04 DIAGNOSIS — I10 Essential (primary) hypertension: Secondary | ICD-10-CM | POA: Diagnosis not present

## 2016-01-04 DIAGNOSIS — F0390 Unspecified dementia without behavioral disturbance: Secondary | ICD-10-CM

## 2016-01-04 DIAGNOSIS — E43 Unspecified severe protein-calorie malnutrition: Secondary | ICD-10-CM

## 2016-01-04 DIAGNOSIS — D649 Anemia, unspecified: Secondary | ICD-10-CM | POA: Diagnosis not present

## 2016-01-04 DIAGNOSIS — E1140 Type 2 diabetes mellitus with diabetic neuropathy, unspecified: Secondary | ICD-10-CM

## 2016-01-04 DIAGNOSIS — Z23 Encounter for immunization: Secondary | ICD-10-CM | POA: Diagnosis not present

## 2016-01-04 MED ORDER — DONEPEZIL HCL 10 MG PO TABS: 10.0000 mg | ORAL_TABLET | Freq: Every day | ORAL | Status: DC

## 2016-01-04 MED ORDER — LOSARTAN POTASSIUM 25 MG PO TABS: 25.0000 mg | ORAL_TABLET | Freq: Every day | ORAL | Status: DC

## 2016-01-04 NOTE — Progress Notes (Signed)
Patient ID: Gary Mcneil, male   DOB: 21-Jan-1941, 75 y.o.   MRN: [id number]    PCP: [name], NP  Advanced Directive information Does patient have an advance directive?: Yes, Type of Advance Directive: Living will, Does patient want to make changes to advanced directive?: No - Patient declined  Allergies  Allergen Reactions  . Penicillins Rash    Chief Complaint  Patient presents with  . Medical Management of Chronic Issues    8 month follow-up, for Essential Hypertension,DM,   . OTHER    Daughter in room with patient   . Immunizations    Flu shot given     HPI: Patient is a 75 y.o. male seen in the office today for routine follow up on chronic conditions. Pt with hx of hyperlipidemia, protein calorie malnutrition, OA, DM, empyema lung, HTN Reports everything is good at home.  Daughter reports they are doing pill boxes to help with medication.  No gout flares.  Does not check blood pressure at home No falls Recently had cataract surgery- following with ophthalmology.  Previously seen by podiatrist, has not been in a while.   No chest pains or shortness of breath.  Good appetite. No constipation or diarrhea.    Review of Systems:  Review of Systems  Constitutional: Negative for fever, chills, activity change and appetite change.  Eyes: Positive for visual disturbance (following with eye doctor).  Respiratory: Negative for cough.   Cardiovascular: Negative for chest pain.  Gastrointestinal: Negative for abdominal pain, diarrhea and constipation.  Genitourinary: Negative for dysuria, urgency and frequency.  Musculoskeletal: Negative for myalgias and arthralgias.       Walks without assistive devices  Skin: Negative for rash.  Neurological: Negative for dizziness, weakness and headaches.  Psychiatric/Behavioral: The patient is not nervous/anxious.        Dementia    Past Medical History  Diagnosis Date  . Type II or unspecified type diabetes mellitus  without mention of complication, uncontrolled   . DEMENTIA   . Neuromuscular disorder (HCC)   . Hypertension   . Gout   . Arthritis   . Dementia 04/24/2012  . Unspecified hereditary and idiopathic peripheral neuropathy   . Anemia, unspecified   . Hypotension, unspecified   . Acute kidney failure, unspecified (HCC)   . Unspecified vitamin D deficiency   . Other and unspecified hyperlipidemia   . Hypopotassemia   . Disorder of bone and cartilage, unspecified   . Memory loss   . Hypotension, unspecified   . Acute kidney failure, unspecified (HCC)   . Muscle weakness (generalized)   . Dizziness and giddiness   . Myalgia and myositis, unspecified   . Screening for ischemic heart disease   . Type II or unspecified type diabetes mellitus without mention of complication, uncontrolled   . Gout, unspecified   . Tobacco use disorder   . Memory loss   . "Walking corpse" syndrome   . GERD (gastroesophageal reflux disease)    Past Surgical History  Procedure Laterality Date  . Inguinal hernia repair    . Video bronchoscopy N/A 04/30/2015    Procedure: VIDEO BRONCHOSCOPY;  Surgeon: [name], MD;  Location: [facility] OR;  Service: Thoracic;  Laterality: N/A;  . Video assisted thoracoscopy (vats)/empyema Right 04/30/2015    Procedure: VIDEO ASSISTED THORACOSCOPY (VATS)/DRAIN EMPYEMA;  Surgeon: [name], MD;  Location: [facility] OR;  Service: Thoracic;  Laterality: Right;   Social History:   reports that he quit smoking about  46 years ago. He has never used smokeless tobacco. He reports that he does not drink alcohol or use illicit drugs.  Family History  Problem Relation Age of Onset  . Diabetes Mother   . Hypertension Mother   . Diabetes Father   . Hypertension Father   . Kidney disease Father   . Diabetes Sister   . Kidney disease Brother     Medications: Patient's Medications  New Prescriptions   No medications on file  Previous Medications   ALLOPURINOL (ZYLOPRIM) 100 MG TABLET     Take 2 tablets (200 mg total) by mouth daily.   ATORVASTATIN (LIPITOR) 20 MG TABLET    TAKE 1 TABLET BY MOUTH DAILY AT 6 PM   DONEPEZIL (ARICEPT) 10 MG TABLET    Start 5 mg (1/2 tablet) daily at bedtime for 1 month then increase to 10 mg (1 tablet) daily at bedtime for memory   GLIPIZIDE (GLUCOTROL XL) 5 MG 24 HR TABLET    Take 5 mg by mouth daily.   HYDROCHLOROTHIAZIDE (HYDRODIURIL) 25 MG TABLET    TAKE 1 TABLET BY MOUTH DAILY   MEMANTINE (NAMENDA) 10 MG TABLET    TAKE 1 TABLET BY MOUTH TWICE DAILY   MULTIPLE VITAMIN (MULTIVITAMIN WITH MINERALS) TABS TABLET    Take 1 tablet by mouth daily.   NAMENDA XR 28 MG CP24 24 HR CAPSULE    TAKE 1 CAPSULE BY MOUTH DAILY   OXYCODONE (OXY IR/ROXICODONE) 5 MG IMMEDIATE RELEASE TABLET    Take 1 tablet (5 mg total) by mouth every 4 (four) hours as needed for severe pain.   POTASSIUM CHLORIDE SA (K-DUR,KLOR-CON) 20 MEQ TABLET    TAKE 2 TABLETS BY MOUTH TWICE DAILY   TRAMADOL (ULTRAM) 50 MG TABLET    TAKE 1-2 TABLETS BY MOUTH EVERY 8 HOURS AS NEEDED  Modified Medications   No medications on file  Discontinued Medications   CEFUROXIME (CEFTIN) 500 MG TABLET    Take 1 tablet (500 mg total) by mouth 2 (two) times daily with a meal. For 14 Days     Physical Exam:  Filed Vitals:   01/04/16 0922  BP: 148/90  Pulse: 75  Temp: 97.7 F (36.5 C)  TempSrc: Oral  Resp: 20  Height: 6' (1.829 m)  Weight: 186 lb 9.6 oz (84.641 kg)  SpO2: 96%   Body mass index is 25.3 kg/(m^2).  Physical Exam  Constitutional: No distress.  HENT:  Head: Normocephalic and atraumatic.  Nose: Nose normal.  Mouth/Throat: Oropharynx is clear and moist. No oropharyngeal exudate.  Neck: Normal range of motion. Neck supple.  Cardiovascular: Normal rate, regular rhythm and normal heart sounds.   Pulmonary/Chest: Effort normal and breath sounds normal.  Abdominal: Soft. Bowel sounds are normal.  Musculoskeletal: Normal range of motion. He exhibits no edema.  Neurological: He is  alert.  confused  Skin: Skin is warm and dry. He is not diaphoretic.  Psychiatric: He has a normal mood and affect.    Labs reviewed: Basic Metabolic Panel:  Recent Labs  95/62/13 0259 04/27/15 0304 04/28/15 0255  05/03/15 0545 05/04/15 0320 05/12/15 0951  NA 136 134* 134*  < > 135 136 141  K 4.2 4.1 4.0  < > 3.7 3.6 4.2  CL 104 103 104  < > 100* 101 102  CO2 23 23 21*  < > 27 28 23   GLUCOSE 134* 138* 119*  < > 61* 46* 132*  BUN 11 15 14   < > 13 13  10  CREATININE 0.69 0.76 0.66  < > 0.74 0.69 0.65*  CALCIUM 8.5* 8.3* 8.2*  < > 8.2* 8.2* 8.9  MG 2.0 2.0 1.9  --   --   --   --   < > = values in this interval not displayed. Liver Function Tests:  Recent Labs  04/28/15 0255 04/29/15 0926 05/02/15 0400  AST 29 22 7*  ALT 42 32 23  ALKPHOS 121 122 83  BILITOT 0.9 1.0 0.7  PROT 6.4* 6.2* 5.7*  ALBUMIN 2.3* 2.3* 2.2*   No results for input(s): LIPASE, AMYLASE in the last 8760 hours. No results for input(s): AMMONIA in the last 8760 hours. CBC:  Recent Labs  04/25/15 1830 04/26/15 0259  04/28/15 0255  05/02/15 0400 05/03/15 0545 05/04/15 0320  WBC 16.0* 15.2*  < > 14.5*  < > 15.8* 11.8* 11.3*  NEUTROABS 13.0* 12.0*  --  11.5*  --   --   --   --   HGB 13.5 13.3  < > 12.9*  < > 10.3* 9.6* 9.1*  HCT 36.7* 36.0*  < > 35.5*  < > 28.6* 26.4* 25.3*  MCV 80.1 80.5  < > 82.0  < > 81.7 80.2 81.4  PLT 306 284  < > 296  < > 351 349 354  < > = values in this interval not displayed. Lipid Panel:  Recent Labs  04/26/15 0259 05/12/15 0944  CHOL 161 151  HDL 30* 60  LDLCALC 111* 75  TRIG 101 80  CHOLHDL 5.4 2.5   TSH: No results for input(s): TSH in the last 8760 hours. A1C: Lab Results  Component Value Date   HGBA1C 6.3* 05/12/2015     Assessment/Plan 1. Need for prophylactic vaccination and inoculation against influenza - Flu Vaccine QUAD 36+ mos PF IM (Fluarix & Fluzone Quad PF)  2. Dementia, without behavioral disturbance -remains stable, no worsening  of cognitive or functional status - donepezil (ARICEPT) 10 MG tablet; Take 1 tablet (10 mg total) by mouth at bedtime.  Dispense: 30 tablet; Refill: 4  3. Essential hypertension -blood pressure elevated today, pt with DM -will start cozaar for blood pressure and renal protection. - losartan (COZAAR) 25 MG tablet; Take 1 tablet (25 mg total) by mouth daily.  Dispense: 30 tablet; Refill: 1 - follow up Comprehensive metabolic panel today  4. Type 2 diabetes mellitus with diabetic neuropathy, without long-term current use of insulin (HCC) No hypoglycemia, good appetite -cont on glipizide daily - losartan (COZAAR) 25 MG tablet; Take 1 tablet (25 mg total) by mouth daily.  Dispense: 30 tablet; Refill: 1 - Hemoglobin A1c  6. Anemia, unspecified anemia type Will follow up CBC with Differential  7. Severe malnutrition (HCC) -positive weight gain noted, good appetite.  -will monitor.  Follow up in 4 weeks with Dr Montez Morita with BMP prior to visit.    Janene Harvey. Biagio Borg  [facility] [phone] 8 am - 5 pm) [phone] (after hours)

## 2016-01-04 NOTE — Patient Instructions (Signed)
Make appt with podiatrist-- can see anyone at [facility]   To start losartan 25 mg by mouth daily for blood pressure- also helps protect the kidneys  To follow up in 4 weeks with Dr Montez Morita with blood work prior to visit.

## 2016-01-05 LAB — CBC WITH DIFFERENTIAL/PLATELET
BASOS: 1 %
Basophils Absolute: 0 10*3/uL (ref 0.0–0.2)
EOS (ABSOLUTE): 0.3 10*3/uL (ref 0.0–0.4)
Eos: 5 %
HEMOGLOBIN: 14.5 g/dL (ref 12.6–17.7)
Hematocrit: 42.5 % (ref 37.5–51.0)
IMMATURE GRANS (ABS): 0 10*3/uL (ref 0.0–0.1)
Immature Granulocytes: 0 %
LYMPHS ABS: 1.6 10*3/uL (ref 0.7–3.1)
LYMPHS: 29 %
MCH: 28.7 pg (ref 26.6–33.0)
MCHC: 34.1 g/dL (ref 31.5–35.7)
MCV: 84 fL (ref 79–97)
Monocytes Absolute: 0.4 10*3/uL (ref 0.1–0.9)
Monocytes: 7 %
NEUTROS ABS: 3.3 10*3/uL (ref 1.4–7.0)
Neutrophils: 58 %
PLATELETS: 175 10*3/uL (ref 150–379)
RBC: 5.05 x10E6/uL (ref 4.14–5.80)
RDW: 15.1 % (ref 12.3–15.4)
WBC: 5.7 10*3/uL (ref 3.4–10.8)

## 2016-01-05 LAB — COMPREHENSIVE METABOLIC PANEL
A/G RATIO: 1.9 (ref 1.1–2.5)
ALBUMIN: 4.4 g/dL (ref 3.5–4.8)
ALT: 20 IU/L (ref 0–44)
AST: 18 IU/L (ref 0–40)
Alkaline Phosphatase: 77 IU/L (ref 39–117)
BUN / CREAT RATIO: 10 (ref 10–22)
BUN: 10 mg/dL (ref 8–27)
Bilirubin Total: 0.4 mg/dL (ref 0.0–1.2)
CALCIUM: 9.2 mg/dL (ref 8.6–10.2)
CO2: 20 mmol/L (ref 18–29)
Chloride: 102 mmol/L (ref 96–106)
Creatinine, Ser: 0.99 mg/dL (ref 0.76–1.27)
GFR, EST AFRICAN AMERICAN: 88 mL/min/{1.73_m2} (ref >59)
GFR, EST NON AFRICAN AMERICAN: 76 mL/min/{1.73_m2} (ref >59)
GLOBULIN, TOTAL: 2.3 g/dL (ref 1.5–4.5)
Glucose: 158 mg/dL — ABNORMAL HIGH (ref 65–99)
POTASSIUM: 4.3 mmol/L (ref 3.5–5.2)
Sodium: 140 mmol/L (ref 134–144)
TOTAL PROTEIN: 6.7 g/dL (ref 6.0–8.5)

## 2016-01-05 LAB — HEMOGLOBIN A1C
Est. average glucose Bld gHb Est-mCnc: 166 mg/dL
Hgb A1c MFr Bld: 7.4 % — ABNORMAL HIGH (ref 4.8–5.6)

## 2016-01-22 ENCOUNTER — Other Ambulatory Visit: Payer: Self-pay | Admitting: Nurse Practitioner

## 2016-01-28 ENCOUNTER — Other Ambulatory Visit: Payer: Medicare Other

## 2016-01-31 ENCOUNTER — Other Ambulatory Visit: Payer: Medicare Other

## 2016-01-31 DIAGNOSIS — I10 Essential (primary) hypertension: Secondary | ICD-10-CM

## 2016-02-01 LAB — BASIC METABOLIC PANEL
BUN/Creatinine Ratio: 10 (ref 10–22)
BUN: 11 mg/dL (ref 8–27)
CALCIUM: 9.5 mg/dL (ref 8.6–10.2)
CO2: 22 mmol/L (ref 18–29)
CREATININE: 1.14 mg/dL (ref 0.76–1.27)
Chloride: 102 mmol/L (ref 96–106)
GFR calc Af Amer: 74 mL/min/{1.73_m2} (ref >59)
GFR calc non Af Amer: 64 mL/min/{1.73_m2} (ref >59)
GLUCOSE: 125 mg/dL — AB (ref 65–99)
Potassium: 4.1 mmol/L (ref 3.5–5.2)
SODIUM: 142 mmol/L (ref 134–144)

## 2016-02-02 ENCOUNTER — Encounter: Payer: Self-pay | Admitting: Internal Medicine

## 2016-02-02 ENCOUNTER — Ambulatory Visit (INDEPENDENT_AMBULATORY_CARE_PROVIDER_SITE_OTHER): Payer: Medicare Other | Admitting: Internal Medicine

## 2016-02-02 VITALS — BP 116/78 | HR 90 | Temp 97.3°F | Resp 20 | Ht 72.0 in | Wt 187.6 lb

## 2016-02-02 DIAGNOSIS — I10 Essential (primary) hypertension: Secondary | ICD-10-CM

## 2016-02-02 DIAGNOSIS — G894 Chronic pain syndrome: Secondary | ICD-10-CM | POA: Diagnosis not present

## 2016-02-02 DIAGNOSIS — M1990 Unspecified osteoarthritis, unspecified site: Secondary | ICD-10-CM

## 2016-02-02 MED ORDER — OXYCODONE HCL 5 MG PO TABS: 5.0000 mg | ORAL_TABLET | ORAL | Status: DC | PRN

## 2016-02-02 NOTE — Patient Instructions (Signed)
Continue current medications as ordered  Follow up in 3 mos for CPE/MMSE/ECG

## 2016-02-02 NOTE — Progress Notes (Signed)
Patient ID: Gary Mcneil, male   DOB: [birth year], 75 y.o.   MRN: [id number]    Location:    [facility]   Place of Service:  OFFICE   Chief Complaint  Patient presents with  . Medical Management of Chronic Issues    4 week follow-up for blood pressure    HPI:  75 yo male seen today for f/u HTN. Losartan added to BP mx. No Cp, SOB, palpitations, HA or dizziness. BMP completed and Cr 1.14 with nml NA and K. He also takes HCTZ with Kdur he is a poor historian due to dementia. Hx obtained form chart  Past Medical History  Diagnosis Date  . Type II or unspecified type diabetes mellitus without mention of complication, uncontrolled   . DEMENTIA   . Neuromuscular disorder (HCC)   . Hypertension   . Gout   . Arthritis   . Dementia 04/24/2012  . Unspecified hereditary and idiopathic peripheral neuropathy   . Anemia, unspecified   . Hypotension, unspecified   . Acute kidney failure, unspecified (HCC)   . Unspecified vitamin D deficiency   . Other and unspecified hyperlipidemia   . Hypopotassemia   . Disorder of bone and cartilage, unspecified   . Memory loss   . Hypotension, unspecified   . Acute kidney failure, unspecified (HCC)   . Muscle weakness (generalized)   . Dizziness and giddiness   . Myalgia and myositis, unspecified   . Screening for ischemic heart disease   . Type II or unspecified type diabetes mellitus without mention of complication, uncontrolled   . Gout, unspecified   . Tobacco use disorder   . Memory loss   . "Walking corpse" syndrome   . GERD (gastroesophageal reflux disease)     Past Surgical History  Procedure Laterality Date  . Inguinal hernia repair    . Video bronchoscopy N/A 04/30/2015    Procedure: VIDEO BRONCHOSCOPY;  Surgeon: [name], MD;  Location: [facility] OR;  Service: Thoracic;  Laterality: N/A;  . Video assisted thoracoscopy (vats)/empyema Right 04/30/2015    Procedure: VIDEO ASSISTED THORACOSCOPY (VATS)/DRAIN EMPYEMA;  Surgeon: [name],  MD;  Location[facility] OR;  Service: Thoracic;  Laterality: Right;    Patient Care Team: [name], NP as PCP - General (Nurse Practitioner)  Social History   Social History  . Marital Status: Single    Spouse Name: N/A  . Number of Children: 3  . Years of Education: N/A   Occupational History  . Retired Charity fundraiser from [employer]   . Retired Education officer, environmental    Social History Main Topics  . Smoking status: Former Smoker    Quit date: 11/20/1969  . Smokeless tobacco: Never Used  . Alcohol Use: No  . Drug Use: No  . Sexual Activity: No   Other Topics Concern  . Not on file   Social History Narrative   Bachelors in Chemistry   Masters in [place]   Attended [employer] and [employer]              reports that he quit smoking about 46 years ago. He has never used smokeless tobacco. He reports that he does not drink alcohol or use illicit drugs.  Allergies  Allergen Reactions  . Penicillins Rash    Medications: Patient's Medications  New Prescriptions   No medications on file  Previous Medications   ALLOPURINOL (ZYLOPRIM) 100 MG TABLET    Take 2 tablets (200 mg total) by mouth daily.   ATORVASTATIN (  LIPITOR) 20 MG TABLET    TAKE 1 TABLET BY MOUTH DAILY AT 6 PM   DONEPEZIL (ARICEPT) 10 MG TABLET    Take 1 tablet (10 mg total) by mouth at bedtime.   GLIPIZIDE (GLUCOTROL XL) 5 MG 24 HR TABLET    Take 5 mg by mouth daily.   HYDROCHLOROTHIAZIDE (HYDRODIURIL) 25 MG TABLET    TAKE 1 TABLET BY MOUTH DAILY   LOSARTAN (COZAAR) 25 MG TABLET    Take 1 tablet (25 mg total) by mouth daily.   MULTIPLE VITAMIN (MULTIVITAMIN WITH MINERALS) TABS TABLET    Take 1 tablet by mouth daily.   NAMENDA XR 28 MG CP24 24 HR CAPSULE    TAKE 1 CAPSULE BY MOUTH DAILY   OXYCODONE (OXY IR/ROXICODONE) 5 MG IMMEDIATE RELEASE TABLET    Take 1 tablet (5 mg total) by mouth every 4 (four) hours as needed for severe pain.   POTASSIUM CHLORIDE SA (K-DUR,KLOR-CON) 20 MEQ TABLET    TAKE 2 TABLETS BY MOUTH TWICE  DAILY   TRAMADOL (ULTRAM) 50 MG TABLET    TAKE 1-2 TABLETS BY MOUTH EVERY 8 HOURS AS NEEDED  Modified Medications   No medications on file  Discontinued Medications   No medications on file    Review of Systems  Unable to perform ROS: Dementia    Filed Vitals:   02/02/16 0939  BP: 116/78  Pulse: 90  Temp: 97.3 F (36.3 C)  TempSrc: Oral  Resp: 20  Height: 6' (1.829 m)  Weight: 187 lb 9.6 oz (85.095 kg)  SpO2: 97%   Body mass index is 25.44 kg/(m^2).  Physical Exam  Constitutional: He appears well-developed and well-nourished. No distress.  HENT:  Mouth/Throat: Oropharynx is clear and moist. No oropharyngeal exudate.  Eyes: Pupils are equal, round, and reactive to light. No scleral icterus.  Neck: Neck supple.  Cardiovascular: Normal rate, regular rhythm, normal heart sounds and intact distal pulses.  Exam reveals no gallop and no friction rub.   No murmur heard. No LE edema b/l. No calf TTP  Pulmonary/Chest: Effort normal and breath sounds normal. No respiratory distress. He has no wheezes. He has no rales. He exhibits no tenderness.  Lymphadenopathy:    He has no cervical adenopathy.  Neurological: He is alert.  Skin: No rash noted.  Psychiatric: He has a normal mood and affect. His behavior is normal.     Labs reviewed: Appointment on 01/31/2016  Component Date Value Ref Range Status  . Glucose 01/31/2016 125* 65 - 99 mg/dL Final  . BUN 13/06/6577 11  8 - 27 mg/dL Final  . Creatinine, Ser 01/31/2016 1.14  0.76 - 1.27 mg/dL Final  . GFR calc non Af Amer 01/31/2016 64  >59 mL/min/1.73 Final  . GFR calc Af Amer 01/31/2016 74  >59 mL/min/1.73 Final  . BUN/Creatinine Ratio 01/31/2016 10  10 - 22 Final  . Sodium 01/31/2016 142  134 - 144 mmol/L Final  . Potassium 01/31/2016 4.1  3.5 - 5.2 mmol/L Final  . Chloride 01/31/2016 102  96 - 106 mmol/L Final  . CO2 01/31/2016 22  18 - 29 mmol/L Final  . Calcium 01/31/2016 9.5  8.6 - 10.2 mg/dL Final  Office Visit on  01/04/2016  Component Date Value Ref Range Status  . Glucose 01/04/2016 158* 65 - 99 mg/dL Final  . BUN 46/96/2952 10  8 - 27 mg/dL Final  . Creatinine, Ser 01/04/2016 0.99  0.76 - 1.27 mg/dL Final  . GFR calc non  Af Amer 01/04/2016 76  >59 mL/min/1.73 Final  . GFR calc Af Amer 01/04/2016 88  >59 mL/min/1.73 Final  . BUN/Creatinine Ratio 01/04/2016 10  10 - 22 Final  . Sodium 01/04/2016 140  134 - 144 mmol/L Final  . Potassium 01/04/2016 4.3  3.5 - 5.2 mmol/L Final  . Chloride 01/04/2016 102  96 - 106 mmol/L Final  . CO2 01/04/2016 20  18 - 29 mmol/L Final  . Calcium 01/04/2016 9.2  8.6 - 10.2 mg/dL Final  . Total Protein 01/04/2016 6.7  6.0 - 8.5 g/dL Final  . Albumin 16/08/9603 4.4  3.5 - 4.8 g/dL Final  . Globulin, Total 01/04/2016 2.3  1.5 - 4.5 g/dL Final  . Albumin/Globulin Ratio 01/04/2016 1.9  1.1 - 2.5 Final   Comment: **Effective January 31, 2016 the reference interval**   for A/G Ratio will be changing to:              Age                Male          Male           0 -  7 days       1.1 - 2.3       1.1 - 2.3           8 - 30 days       1.2 - 2.8       1.2 - 2.8           1 -  6 months     1.3 - 3.6       1.3 - 3.6    7 months -  5 years      1.5 - 2.6       1.5 - 2.6              > 5 years      1.2 - 2.2       1.2 - 2.2   . Bilirubin Total 01/04/2016 0.4  0.0 - 1.2 mg/dL Final  . Alkaline Phosphatase 01/04/2016 77  39 - 117 IU/L Final  . AST 01/04/2016 18  0 - 40 IU/L Final  . ALT 01/04/2016 20  0 - 44 IU/L Final  . WBC 01/04/2016 5.7  3.4 - 10.8 x10E3/uL Final  . RBC 01/04/2016 5.05  4.14 - 5.80 x10E6/uL Final  . Hemoglobin 01/04/2016 14.5  12.6 - 17.7 g/dL Final  . Hematocrit 54/07/8118 42.5  37.5 - 51.0 % Final  . MCV 01/04/2016 84  79 - 97 fL Final  . MCH 01/04/2016 28.7  26.6 - 33.0 pg Final  . MCHC 01/04/2016 34.1  31.5 - 35.7 g/dL Final  . RDW 14/78/2956 15.1  12.3 - 15.4 % Final  . Platelets 01/04/2016 175  150 - 379 x10E3/uL Final  . Neutrophils 01/04/2016  58   Final  . Lymphs 01/04/2016 29   Final  . Monocytes 01/04/2016 7   Final  . Eos 01/04/2016 5   Final  . Basos 01/04/2016 1   Final  . Neutrophils Absolute 01/04/2016 3.3  1.4 - 7.0 x10E3/uL Final  . Lymphocytes Absolute 01/04/2016 1.6  0.7 - 3.1 x10E3/uL Final  . Monocytes Absolute 01/04/2016 0.4  0.1 - 0.9 x10E3/uL Final  . EOS (ABSOLUTE) 01/04/2016 0.3  0.0 - 0.4 x10E3/uL Final  . Basophils Absolute 01/04/2016 0.0  0.0 - 0.2 x10E3/uL Final  . Immature Granulocytes 01/04/2016 0  Final  . Immature Grans (Abs) 01/04/2016 0.0  0.0 - 0.1 x10E3/uL Final  . Hgb A1c MFr Bld 01/04/2016 7.4* 4.8 - 5.6 % Final   Comment:          Pre-diabetes: 5.7 - 6.4          Diabetes: >6.4          Glycemic control for adults with diabetes: <7.0   . Est. average glucose Bld gHb Est-m* 01/04/2016 166   Final    No results found.   Assessment/Plan   ICD-9-CM ICD-10-CM   1. Essential hypertension 401.9 I10   2. Arthritis 716.90 M19.90 oxyCODONE (OXY IR/ROXICODONE) 5 MG immediate release tablet  3. Chronic pain syndrome 338.4 G89.4 oxyCODONE (OXY IR/ROXICODONE) 5 MG immediate release tablet   Continue current medications as ordered  Follow up in 3 mos for CPE/MMSE/ECG  Altair Appenzeller S. Ancil Linseyarter, D. O., F. A. C. O. I.  [facility] [address] [place], [place] [address] [phone] Cell (Monday-Friday 8 AM - 5 PM) [phone] After 5 PM and follow prompts

## 2016-02-07 ENCOUNTER — Other Ambulatory Visit: Payer: Self-pay | Admitting: Nurse Practitioner

## 2016-02-24 ENCOUNTER — Other Ambulatory Visit: Payer: Self-pay | Admitting: Internal Medicine

## 2016-03-24 ENCOUNTER — Other Ambulatory Visit: Payer: Self-pay | Admitting: Nurse Practitioner

## 2016-03-28 ENCOUNTER — Other Ambulatory Visit: Payer: Self-pay | Admitting: *Deleted

## 2016-03-28 MED ORDER — ATORVASTATIN CALCIUM 20 MG PO TABS: ORAL_TABLET | ORAL | Status: DC

## 2016-03-28 NOTE — Telephone Encounter (Signed)
[facility] [place] 

## 2016-04-03 ENCOUNTER — Other Ambulatory Visit: Payer: Self-pay | Admitting: Nurse Practitioner

## 2016-05-05 ENCOUNTER — Encounter: Payer: Self-pay | Admitting: Internal Medicine

## 2016-05-05 ENCOUNTER — Ambulatory Visit (INDEPENDENT_AMBULATORY_CARE_PROVIDER_SITE_OTHER): Payer: Medicare Other | Admitting: Internal Medicine

## 2016-05-05 VITALS — BP 144/82 | HR 83 | Temp 98.0°F | Ht 73.23 in | Wt 190.0 lb

## 2016-05-05 DIAGNOSIS — E1140 Type 2 diabetes mellitus with diabetic neuropathy, unspecified: Secondary | ICD-10-CM | POA: Diagnosis not present

## 2016-05-05 DIAGNOSIS — F0390 Unspecified dementia without behavioral disturbance: Secondary | ICD-10-CM

## 2016-05-05 DIAGNOSIS — E785 Hyperlipidemia, unspecified: Secondary | ICD-10-CM | POA: Diagnosis not present

## 2016-05-05 DIAGNOSIS — Z125 Encounter for screening for malignant neoplasm of prostate: Secondary | ICD-10-CM

## 2016-05-05 DIAGNOSIS — L609 Nail disorder, unspecified: Secondary | ICD-10-CM

## 2016-05-05 DIAGNOSIS — L602 Onychogryphosis: Secondary | ICD-10-CM

## 2016-05-05 DIAGNOSIS — E43 Unspecified severe protein-calorie malnutrition: Secondary | ICD-10-CM | POA: Diagnosis not present

## 2016-05-05 DIAGNOSIS — G894 Chronic pain syndrome: Secondary | ICD-10-CM | POA: Diagnosis not present

## 2016-05-05 DIAGNOSIS — I10 Essential (primary) hypertension: Secondary | ICD-10-CM

## 2016-05-05 DIAGNOSIS — Z0000 Encounter for general adult medical examination without abnormal findings: Secondary | ICD-10-CM

## 2016-05-05 MED ORDER — MEMANTINE HCL-DONEPEZIL HCL ER 28-10 MG PO CP24: 1.0000 | ORAL_CAPSULE | Freq: Every day | ORAL | Status: DC

## 2016-05-05 NOTE — Patient Instructions (Signed)
Encouraged him to exercise 30-45 minutes 4-5 times per week. Eat a well balanced diet. Avoid smoking. Limit alcohol intake. Wear seatbelt when riding in the car. Wear sun block (SPF >50) when spending extended times outside.  STOP aricept and namenda  START Namzaric daily  cologuard for colon cancer screening  Will call with lab results  Follow up in 3 mos for routine visit

## 2016-05-05 NOTE — Progress Notes (Signed)
Patient ID: Gary Mcneil, male   DOB: 27-Apr-1941, 75 y.o.   MRN: [id number]   Location:  [facility]  Place of Service:  OFFICE  Provider: Elmon Kirschner, DO  Patient Care Team: [name], NP as PCP - General (Nurse Practitioner)  Extended Emergency Contact Information Primary Emergency Contact: Gillian Shields of [place] Home Phone: [phone] Mobile Phone: [phone] Relation: Son Secondary Emergency Contact: Lindie Spruce [place] Mobile Phone: [phone] Relation: Daughter  Code Status: FULL CODE Goals of Care: Advanced Directive information Advanced Directives 05/05/2016  Does patient have an advance directive? Yes  Type of Advance Directive Healthcare Power of Attorney  Does patient want to make changes to advanced directive? No - Patient declined  Copy of advanced directive(s) in chart? Yes     Chief Complaint  Patient presents with  . Annual Exam    yearly check up    HPI: Patient is a 75 y.o. male seen in today for an annual wellness exam. He reports feeling well overall. He is a poor historian due to dementia. Hx obtained from chart  HTN - BP stable on losartan and HCTZ with Kdur. Cr 1.14  Dementia - stable on namenda and aricept. No behavioral issues  Gout - stable on allopurinol. Uric acid 5.0  Chronic pain/arthritis - stable on oxyIR prn, tramadol  DM - no low BS reactions, numbness or tingling. Takes glipizide. A1c 7.4%  Hyperlipidemia - stable on lipitor. No myalgias. LDL 75    Depression screen [facility] 2/9 05/05/2016 11/02/2014 08/12/2014 12/18/2013  Decreased Interest 0 0 0 0  Down, Depressed, Hopeless 0 0 0 0  PHQ - 2 Score 0 0 0 0    Fall Risk  05/05/2016 02/02/2016 01/04/2016 05/18/2015 02/25/2015  Falls in the past year? No No No No No  Number falls in past yr: - - - - -   MMSE - Mini Mental State Exam 05/05/2016 02/25/2015 05/20/2014 12/18/2013  Orientation to time 2 1 2 4   Orientation to Place 5 3 5 3     Registration 3 3 3 3   Attention/ Calculation 5 5 5 5   Recall 3 0 1 2  Language- name 2 objects 2 2 2 2   Language- repeat 1 1 1 1   Language- follow 3 step command 3 3 3 3   Language- read & follow direction 1 1 1 1   Write a sentence 1 1 1 1   Copy design 1 0 1 1  Copy design-comments - Unable to see clearly  - -  Total score 27 20 25 26      Health Maintenance  Topic Date Due  . COLONOSCOPY  02/18/1993  . ZOSTAVAX  02/19/2003  . PNA vac Low Risk Adult (2 of 2 - PCV13) 04/26/2016  . INFLUENZA VACCINE  06/20/2016  . HEMOGLOBIN A1C  07/03/2016  . OPHTHALMOLOGY EXAM  12/29/2016  . FOOT EXAM  01/03/2017  . TETANUS/TDAP  04/13/2022    Urinary incontinence? none  Functional Status Survey: Is the patient deaf or have difficulty hearing?: No Does the patient have difficulty seeing, even when wearing glasses/contacts?: No Does the patient have difficulty concentrating, remembering, or making decisions?: Yes Does the patient have difficulty walking or climbing stairs?: No Does the patient have difficulty dressing or bathing?: No Does the patient have difficulty doing errands alone such as visiting a doctor's office or shopping?: No  Exercise? As tolerated  Diet? Attempts to make healthy food choices  No exam data present  Hearing: no issues    Dentition: no issues. He does not see dentist  Pain: stable  Past Medical History  Diagnosis Date  . Type II or unspecified type diabetes mellitus without mention of complication, uncontrolled   . DEMENTIA   . Neuromuscular disorder (HCC)   . Hypertension   . Gout   . Arthritis   . Dementia 04/24/2012  . Unspecified hereditary and idiopathic peripheral neuropathy   . Anemia, unspecified   . Hypotension, unspecified   . Acute kidney failure, unspecified (HCC)   . Unspecified vitamin D deficiency   . Other and unspecified hyperlipidemia   . Hypopotassemia   . Disorder of bone and cartilage, unspecified   . Memory loss   .  Hypotension, unspecified   . Acute kidney failure, unspecified (HCC)   . Muscle weakness (generalized)   . Dizziness and giddiness   . Myalgia and myositis, unspecified   . Screening for ischemic heart disease   . Type II or unspecified type diabetes mellitus without mention of complication, uncontrolled   . Gout, unspecified   . Tobacco use disorder   . Memory loss   . "Walking corpse" syndrome   . GERD (gastroesophageal reflux disease)     Past Surgical History  Procedure Laterality Date  . Inguinal hernia repair    . Video bronchoscopy N/A 04/30/2015    Procedure: VIDEO BRONCHOSCOPY;  Surgeon: Kerin PernaPeter Van Trigt, MD;  [facility];  Service: Thoracic;  Laterality: N/A;  . Video assisted thoracoscopy (vats)/empyema Right 04/30/2015    Procedure: VIDEO ASSISTED THORACOSCOPY (VATS)/DRAIN EMPYEMA;  Surgeon: Kerin PernaPeter Van Trigt, MD;  [facility];  Service: Thoracic;  Laterality: Right;    Family History  Problem Relation Age of Onset  . Diabetes Mother   . Hypertension Mother   . Diabetes Father   . Hypertension Father   . Kidney disease Father   . Diabetes Sister   . Kidney disease Brother    Family Status  Relation Status Death Age  . Mother Deceased   . Father Deceased   . Sister Alive   . Daughter Alive   . Son Alive     Social History   Social History  . Marital Status: Single    Spouse Name: N/A  . Number of Children: 3  . Years of Education: N/A   Occupational History  . Retired Charity fundraiserchemist from [employer]   . Retired Education officer, environmentalpastor    Social History Main Topics  . Smoking status: Former Smoker    Quit date: 11/20/1969  . Smokeless tobacco: Never Used  . Alcohol Use: No  . Drug Use: No  . Sexual Activity: No   Other Topics Concern  . Not on file   Social History Narrative   Bachelors in Nurse, adultChemistry   Masters in [place]   Attended [employer] and [employer]             Allergies  Allergen Reactions  . Penicillins Rash      Medication List         This list is accurate as of: 05/05/16  3:24 PM.  Always use your most recent med list.               allopurinol 100 MG tablet  Commonly known as:  ZYLOPRIM  Take 2 tablets (200 mg total) by mouth daily.     atorvastatin 20 MG tablet  Commonly known as:  LIPITOR  Take one tablet by mouth daily at 6pm  donepezil 10 MG tablet  Commonly known as:  ARICEPT  Take 1 tablet (10 mg total) by mouth at bedtime.     glipiZIDE 5 MG 24 hr tablet  Commonly known as:  GLUCOTROL XL  Take 5 mg by mouth daily.     hydrochlorothiazide 25 MG tablet  Commonly known as:  HYDRODIURIL  TAKE 1 TABLET BY MOUTH DAILY     losartan 25 MG tablet  Commonly known as:  COZAAR  TAKE 1 TABLET(25 MG) BY MOUTH DAILY     multivitamin with minerals Tabs tablet  Take 1 tablet by mouth daily.     NAMENDA XR 28 MG Cp24 24 hr capsule  Generic drug:  memantine  TAKE 1 CAPSULE BY MOUTH DAILY     oxyCODONE 5 MG immediate release tablet  Commonly known as:  Oxy IR/ROXICODONE  Take 1 tablet (5 mg total) by mouth every 4 (four) hours as needed for severe pain.     potassium chloride SA 20 MEQ tablet  Commonly known as:  K-DUR,KLOR-CON  TAKE 2 TABLETS BY MOUTH TWICE DAILY     traMADol 50 MG tablet  Commonly known as:  ULTRAM  TAKE 1-2 TABLETS BY MOUTH EVERY 8 HOURS AS NEEDED         Review of Systems:  Review of Systems  Unable to perform ROS: Dementia    Physical Exam: Filed Vitals:   05/05/16 1454  BP: 144/82  Pulse: 83  Temp: 98 F (36.7 C)  TempSrc: Oral  Height: 6' 1.23" (1.86 m)  Weight: 190 lb (86.183 kg)  SpO2: 95%   Body mass index is 24.91 kg/(m^2). Physical Exam  Constitutional: He appears well-developed and well-nourished. No distress.  HENT:  Head: Normocephalic and atraumatic.  Right Ear: Hearing, tympanic membrane, external ear and ear canal normal.  Left Ear: Hearing, tympanic membrane, external ear and ear canal normal.  Mouth/Throat: Uvula is midline, oropharynx is  clear and moist and mucous membranes are normal. He does not have dentures.  Eyes: Conjunctivae, EOM and lids are normal. Pupils are equal, round, and reactive to light. No scleral icterus.  Neck: Trachea normal and normal range of motion. Neck supple. Carotid bruit is not present. No thyroid mass and no thyromegaly present.  Cardiovascular: Normal rate, regular rhythm and intact distal pulses.  Exam reveals no gallop and no friction rub.   Murmur (1/6 SEM) heard. No carotid bruit b/l. No LE edema b/l. No calf TTP.   Pulmonary/Chest: Effort normal and breath sounds normal. He has no wheezes. He has no rhonchi. He has no rales. Right breast exhibits no inverted nipple, no mass, no nipple discharge, no skin change and no tenderness. Left breast exhibits no inverted nipple, no mass, no nipple discharge, no skin change and no tenderness. Breasts are symmetrical.  Abdominal: Soft. Normal appearance, normal aorta and bowel sounds are normal. He exhibits no pulsatile midline mass and no mass. There is no hepatosplenomegaly. There is no tenderness. There is no rigidity, no rebound and no guarding. No hernia.  Genitourinary:  Declined exam  Musculoskeletal: Normal range of motion. He exhibits edema.  Lymphadenopathy:       Head (right side): No posterior auricular adenopathy present.       Head (left side): No posterior auricular adenopathy present.    He has no cervical adenopathy.       Right: No supraclavicular adenopathy present.       Left: No supraclavicular adenopathy present.  Neurological: He is alert.  He has normal strength and normal reflexes. No cranial nerve deficit. Gait normal.  Skin: Skin is warm, dry and intact. No rash noted. Nails show no clubbing.  Psychiatric: He has a normal mood and affect. His speech is normal and behavior is normal. Thought content normal. Cognition and memory are impaired.   Diabetic Foot Exam - Simple   Simple Foot Form  Diabetic Foot exam was performed with  the following findings:  Yes 05/05/2016  5:37 PM  Visual Inspection  See comments:  Yes  Sensation Testing  Intact to touch and monofilament testing bilaterally:  Yes  Pulse Check  Posterior Tibialis and Dorsalis pulse intact bilaterally:  Yes  Comments  Toenail dystrophy b/l. Hammertoes. No calluses or ulcerations. Skin dry and flaking on plantar surface      Labs reviewed:  Basic Metabolic Panel:  Recent Labs  16/10/96 0951 01/04/16 1035 01/31/16 0830  NA 141 140 142  K 4.2 4.3 4.1  CL 102 102 102  CO2 23 20 22   GLUCOSE 132* 158* 125*  BUN 10 10 11   CREATININE 0.65* 0.99 1.14  CALCIUM 8.9 9.2 9.5   Liver Function Tests:  Recent Labs  01/04/16 1035  AST 18  ALT 20  ALKPHOS 77  BILITOT 0.4  PROT 6.7  ALBUMIN 4.4   No results for input(s): LIPASE, AMYLASE in the last 8760 hours. No results for input(s): AMMONIA in the last 8760 hours. CBC:  Recent Labs  01/04/16 1035  WBC 5.7  NEUTROABS 3.3  HCT 42.5  MCV 84  PLT 175   Lipid Panel:  Recent Labs  05/12/15 0944  CHOL 151  HDL 60  LDLCALC 75  TRIG 80  CHOLHDL 2.5   Lab Results  Component Value Date   HGBA1C 7.4* 01/04/2016    Procedures: No results found. ECG OBTAINED AND REVIEWED BY MYSELF:  NSR @ 84 bpm, nml axis, IVCD, mild prolonged QTc, LAE, NSST changes. No acute ischemic changes. No significant change since 2016  Assessment/Plan    ICD-9-CM ICD-10-CM   1. Well adult exam V70.0 Z00.00   2. Essential hypertension 401.9 I10 EKG 12-Lead  3. Chronic pain syndrome 338.4 G89.4   4. Dementia, without behavioral disturbance 294.20 F03.90 Memantine HCl-Donepezil HCl (NAMZARIC) 28-10 MG CP24  5. Type 2 diabetes mellitus with diabetic neuropathy, without long-term current use of insulin (HCC) 250.60 E11.40 CMP   357.2  Hemoglobin A1c     Microalbumin/Creatinine Ratio, Urine     Urinalysis with Reflex Microscopic  6. Protein-calorie malnutrition, severe (HCC) - resolved 262 E43   7.  Hyperlipidemia LDL goal <100 272.4 E78.5 TSH     Lipid Panel  8. Prostate cancer screening V76.44 Z12.5 PSA  9.      Hypertrophic toenails   Pt is UTD on health maintenance. Vaccinations are UTD. Pt maintains a healthy lifestyle. Encouraged pt to exercise 30-45 minutes 4-5 times per week. Eat a well balanced diet. Avoid smoking. Limit alcohol intake. Wear seatbelt when riding in the car. Wear sun block (SPF >50) when spending extended times outside.  Refer to podiatry for diabetic foot care  STOP aricept and namenda  START Namzaric daily  cologuard for colon cancer screening  Will call with lab results  Follow up in 3 mos for routine visit  Mayerli Kirst S. Ancil Linsey  [facility] [address] [place], [place] [address] [phone] Cell (Monday-Friday 8 AM - 5 PM) [phone] After  5 PM and follow prompts

## 2016-05-06 LAB — COMPREHENSIVE METABOLIC PANEL
ALBUMIN: 4.5 g/dL (ref 3.5–4.8)
ALT: 43 IU/L (ref 0–44)
AST: 45 IU/L — ABNORMAL HIGH (ref 0–40)
Albumin/Globulin Ratio: 2 (ref 1.2–2.2)
Alkaline Phosphatase: 82 IU/L (ref 39–117)
BILIRUBIN TOTAL: 0.7 mg/dL (ref 0.0–1.2)
BUN / CREAT RATIO: 10 (ref 10–24)
BUN: 12 mg/dL (ref 8–27)
CALCIUM: 9.9 mg/dL (ref 8.6–10.2)
CHLORIDE: 100 mmol/L (ref 96–106)
CO2: 22 mmol/L (ref 18–29)
Creatinine, Ser: 1.2 mg/dL (ref 0.76–1.27)
GFR, EST AFRICAN AMERICAN: 69 mL/min/{1.73_m2} (ref >59)
GFR, EST NON AFRICAN AMERICAN: 60 mL/min/{1.73_m2} (ref >59)
GLUCOSE: 152 mg/dL — AB (ref 65–99)
Globulin, Total: 2.3 g/dL (ref 1.5–4.5)
Potassium: 3.8 mmol/L (ref 3.5–5.2)
Sodium: 142 mmol/L (ref 134–144)
TOTAL PROTEIN: 6.8 g/dL (ref 6.0–8.5)

## 2016-05-06 LAB — URINALYSIS, ROUTINE W REFLEX MICROSCOPIC
BILIRUBIN UA: NEGATIVE
KETONES UA: NEGATIVE
LEUKOCYTES UA: NEGATIVE
NITRITE UA: NEGATIVE
PH UA: 6 (ref 5.0–7.5)
RBC UA: NEGATIVE
SPEC GRAV UA: 1.024 (ref 1.005–1.030)
UUROB: 1 mg/dL (ref 0.2–1.0)

## 2016-05-06 LAB — MICROALBUMIN / CREATININE URINE RATIO
CREATININE, UR: 216.7 mg/dL
MICROALB/CREAT RATIO: 9.1 mg/g creat (ref 0.0–30.0)
Microalbumin, Urine: 19.7 ug/mL

## 2016-05-06 LAB — LIPID PANEL
CHOLESTEROL TOTAL: 188 mg/dL (ref 100–199)
Chol/HDL Ratio: 3 ratio units (ref 0.0–5.0)
HDL: 62 mg/dL (ref >39)
LDL CALC: 59 mg/dL (ref 0–99)
TRIGLYCERIDES: 333 mg/dL — AB (ref 0–149)
VLDL CHOLESTEROL CAL: 67 mg/dL — AB (ref 5–40)

## 2016-05-06 LAB — PSA: Prostate Specific Ag, Serum: 3.9 ng/mL (ref 0.0–4.0)

## 2016-05-06 LAB — HEMOGLOBIN A1C
Est. average glucose Bld gHb Est-mCnc: 186 mg/dL
Hgb A1c MFr Bld: 8.1 % — ABNORMAL HIGH (ref 4.8–5.6)

## 2016-05-06 LAB — TSH: TSH: 2.39 u[IU]/mL (ref 0.450–4.500)

## 2016-05-08 ENCOUNTER — Other Ambulatory Visit: Payer: Self-pay

## 2016-05-08 ENCOUNTER — Other Ambulatory Visit: Payer: Self-pay | Admitting: Nurse Practitioner

## 2016-05-08 DIAGNOSIS — E1140 Type 2 diabetes mellitus with diabetic neuropathy, unspecified: Secondary | ICD-10-CM

## 2016-05-08 MED ORDER — LINAGLIPTIN 5 MG PO TABS: 5.0000 mg | ORAL_TABLET | Freq: Every day | ORAL | Status: DC

## 2016-05-08 NOTE — Telephone Encounter (Signed)
After reviewing patient's recent lab work, Dr. Montez Moritaarter wanted to change the patient's medication to Tradjenta 5 mg tablets, #30 with 4 RF. Take 1 tablet by mouth daily.   [facility] at patient's request.

## 2016-05-12 ENCOUNTER — Telehealth: Payer: Self-pay | Admitting: Internal Medicine

## 2016-05-12 NOTE — Telephone Encounter (Signed)
FYI Below is a message from [facility], they contacted the patient to schedule an appointment and spoke with patients son.     I spoke with pt son, he said pt is not having any problems with his feet(pt has dementia) just took pt to have manicure on Sat

## 2016-05-12 NOTE — Telephone Encounter (Signed)
Noted but he still needs to see them for diabetic foot care

## 2016-05-18 ENCOUNTER — Other Ambulatory Visit: Payer: Self-pay | Admitting: Nurse Practitioner

## 2016-05-25 NOTE — Telephone Encounter (Signed)
Called and spoke with patients son to let him know that Dr. Montez Moritaarter encourages him to schedule appointment for diabetic foot care, son stated he was not aware that this was a diabetic issue that he needed to be seen for. He was not able to come with his father at his last visit and caregiver that was with him did not relay that information. Gave son Gary Fallen(Amor) the phone # for [facility] to call back and schedule an appointment. Son stated he would call later today to schedule an appointment.

## 2016-06-20 ENCOUNTER — Encounter: Payer: Self-pay | Admitting: Internal Medicine

## 2016-06-28 ENCOUNTER — Other Ambulatory Visit: Payer: Self-pay | Admitting: Nurse Practitioner

## 2016-07-08 ENCOUNTER — Other Ambulatory Visit: Payer: Self-pay | Admitting: Internal Medicine

## 2016-07-26 ENCOUNTER — Other Ambulatory Visit: Payer: Self-pay | Admitting: Nurse Practitioner

## 2016-08-04 ENCOUNTER — Encounter: Payer: Medicare Other | Admitting: Internal Medicine

## 2016-08-04 DIAGNOSIS — Z0289 Encounter for other administrative examinations: Secondary | ICD-10-CM

## 2016-08-06 NOTE — Progress Notes (Signed)
This encounter was created in error - please disregard.

## 2016-08-08 ENCOUNTER — Encounter: Payer: Self-pay | Admitting: Nurse Practitioner

## 2016-08-22 ENCOUNTER — Other Ambulatory Visit: Payer: Self-pay | Admitting: Internal Medicine

## 2016-08-28 ENCOUNTER — Other Ambulatory Visit: Payer: Self-pay | Admitting: Nurse Practitioner

## 2016-09-30 ENCOUNTER — Other Ambulatory Visit: Payer: Self-pay | Admitting: Nurse Practitioner

## 2016-10-04 ENCOUNTER — Other Ambulatory Visit: Payer: Self-pay

## 2016-10-04 MED ORDER — POTASSIUM CHLORIDE CRYS ER 20 MEQ PO TBCR: 40.0000 meq | EXTENDED_RELEASE_TABLET | Freq: Two times a day (BID) | ORAL | 1 refills | Status: DC

## 2016-10-04 NOTE — Telephone Encounter (Signed)
Rx request from [facility]

## 2016-11-02 ENCOUNTER — Other Ambulatory Visit: Payer: Self-pay | Admitting: Nurse Practitioner

## 2016-11-07 ENCOUNTER — Other Ambulatory Visit: Payer: Self-pay | Admitting: Internal Medicine

## 2016-11-07 DIAGNOSIS — E1140 Type 2 diabetes mellitus with diabetic neuropathy, unspecified: Secondary | ICD-10-CM

## 2016-11-21 ENCOUNTER — Telehealth: Payer: Self-pay | Admitting: Nurse Practitioner

## 2016-11-21 NOTE — Telephone Encounter (Signed)
left msg asking pt to call and schedule AWV/CPE that's due in June 2018. VDM (DD)

## 2016-12-07 ENCOUNTER — Ambulatory Visit: Payer: Medicare Other

## 2016-12-16 ENCOUNTER — Other Ambulatory Visit: Payer: Self-pay | Admitting: Nurse Practitioner

## 2016-12-26 ENCOUNTER — Ambulatory Visit (INDEPENDENT_AMBULATORY_CARE_PROVIDER_SITE_OTHER): Payer: Medicare Other

## 2016-12-26 VITALS — BP 168/60 | HR 81 | Temp 98.0°F | Ht 73.0 in | Wt 189.0 lb

## 2016-12-26 DIAGNOSIS — Z0000 Encounter for general adult medical examination without abnormal findings: Secondary | ICD-10-CM | POA: Diagnosis not present

## 2016-12-26 DIAGNOSIS — Z23 Encounter for immunization: Secondary | ICD-10-CM | POA: Diagnosis not present

## 2016-12-26 NOTE — Progress Notes (Signed)
Quick Notes   Health Maintenance:  Due for A1C. Pn13 given today; Declined Flu and Zostavax    Abnormal Screen: None; MMSE-25/30 Passed Clock test    Patient Concerns:  Pt needs a refill on his Oxycodone. Pt's son also requested that the pt be given a prescription for 800mg  Ibu.     Nurse Concerns:  None  Pt to receive no refills until appt, no show for last appt and does not have follow up, will have pt schedule.

## 2016-12-26 NOTE — Progress Notes (Signed)
Subjective:   Gary Mcneil is a 76 y.o. male who presents for an Initial Medicare Annual Wellness Visit.  Review of Systems      Cardiac Risk Factors include: advanced age (>22men, >77 women);diabetes mellitus;dyslipidemia;hypertension;male gender;sedentary lifestyle;smoking/ tobacco exposure     Objective:    Today's Vitals   12/26/16 1428  BP: (!) 168/60  Pulse: 81  Temp: 98 F (36.7 C)  TempSrc: Oral  SpO2: 97%  Weight: 189 lb (85.7 kg)  Height: 6\' 1"  (1.854 m)   Body mass index is 24.94 kg/m.   Current Medications (verified) Outpatient Encounter Prescriptions as of 12/26/2016  Medication Sig  . allopurinol (ZYLOPRIM) 100 MG tablet Take 2 tablets (200 mg total) by mouth daily.  [name] atorvastatin (LIPITOR) 20 MG tablet TAKE 1 TABLET BY MOUTH DAILY AT 6 PM  . hydrochlorothiazide (HYDRODIURIL) 25 MG tablet TAKE 1 TABLET BY MOUTH DAILY  . losartan (COZAAR) 25 MG tablet TAKE 1 TABLET(25 MG) BY MOUTH DAILY  . Memantine HCl-Donepezil HCl (NAMZARIC) 28-10 MG CP24 Take 1 capsule by mouth daily.  . Multiple Vitamin (MULTIVITAMIN WITH MINERALS) TABS tablet Take 1 tablet by mouth daily.  [name] NAMENDA XR 28 MG CP24 24 hr capsule TAKE 1 CAPSULE BY MOUTH DAILY  . oxyCODONE (OXY IR/ROXICODONE) 5 MG immediate release tablet Take 1 tablet (5 mg total) by mouth every 4 (four) hours as needed for severe pain.  . potassium chloride SA (K-DUR,KLOR-CON) 20 MEQ tablet Take 2 tablets (40 mEq total) by mouth 2 (two) times daily.  . TRADJENTA 5 MG TABS tablet TAKE 1 TABLET(5 MG) BY MOUTH DAILY  . traMADol (ULTRAM) 50 MG tablet TAKE 1-2 TABLETS BY MOUTH EVERY 8 HOURS AS NEEDED  . [DISCONTINUED] losartan (COZAAR) 25 MG tablet TAKE 1 TABLET(25 MG) BY MOUTH DAILY   No facility-administered encounter medications on file as of 12/26/2016.     Allergies (verified) Penicillins   History: Past Medical History:  Diagnosis Date  . "walking corpse" syndrome   . Acute kidney failure, unspecified   .  Acute kidney failure, unspecified   . Anemia, unspecified   . Arthritis   . DEMENTIA   . Dementia 04/24/2012  . Disorder of bone and cartilage, unspecified   . Dizziness and giddiness   . GERD (gastroesophageal reflux disease)   . Gout   . Gout, unspecified   . Hypertension   . Hypopotassemia   . Hypotension, unspecified   . Hypotension, unspecified   . Memory loss   . Memory loss   . Muscle weakness (generalized)   . Myalgia and myositis, unspecified   . Neuromuscular disorder (HCC)   . Other and unspecified hyperlipidemia   . Screening for ischemic heart disease   . Tobacco use disorder   . Type II or unspecified type diabetes mellitus without mention of complication, uncontrolled   . Type II or unspecified type diabetes mellitus without mention of complication, uncontrolled   . Unspecified hereditary and idiopathic peripheral neuropathy   . Unspecified vitamin D deficiency    Past Surgical History:  Procedure Laterality Date  . INGUINAL HERNIA REPAIR    . VIDEO ASSISTED THORACOSCOPY (VATS)/EMPYEMA Right 04/30/2015   Procedure: VIDEO ASSISTED THORACOSCOPY (VATS)/DRAIN EMPYEMA;  Surgeon: [name], MD;  Location: [facility] OR;  Service: Thoracic;  Laterality: Right;  [name] VIDEO BRONCHOSCOPY N/A 04/30/2015   Procedure: VIDEO BRONCHOSCOPY;  Surgeon: [name], MD;  Location: [facility] OR;  Service: Thoracic;  Laterality: N/A;   Family History  Problem Relation Age of Onset  . Diabetes Mother   . Hypertension Mother   . Diabetes Father   . Hypertension Father   . Kidney disease Father   . Diabetes Sister   . Kidney disease Brother    Social History   Occupational History  . Retired Charity fundraiser from [employer]   . Retired Education officer, environmental    Social History Main Topics  . Smoking status: Former Smoker    Quit date: 11/20/1969  . Smokeless tobacco: Never Used  . Alcohol use No  . Drug use: No  . Sexual activity: No    Tobacco Counseling Counseling given: No   Activities of Daily  Living In your present state of health, do you have any difficulty performing the following activities: 12/26/2016 05/05/2016  Hearing? N N  Vision? N N  Difficulty concentrating or making decisions? N Y  Walking or climbing stairs? N N  Dressing or bathing? N N  Doing errands, shopping? N N  Preparing Food and eating ? N -  Using the Toilet? N -  In the past six months, have you accidently leaked urine? N -  Do you have problems with loss of bowel control? N -  Managing your Medications? N -  Managing your Finances? N -  Housekeeping or managing your Housekeeping? N -  Some recent data might be hidden    Immunizations and Health Maintenance Immunization History  Administered Date(s) Administered  . Influenza Whole 09/20/2013  . Influenza,inj,Quad PF,36+ Mos 09/02/2014, 01/04/2016  . Pneumococcal Polysaccharide-23 04/27/2015  . Tdap 04/13/2012   Health Maintenance Due  Topic Date Due  . COLONOSCOPY  04/13/2010  . PNA vac Low Risk Adult (2 of 2 - PCV13) 04/26/2016  . INFLUENZA VACCINE  06/20/2016  . HEMOGLOBIN A1C  11/04/2016    Patient Care Team: [name], NP as PCP - General (Nurse Practitioner)  Indicate any recent Medical Services you may have received from other than [facility] providers in the past year (date may be approximate).     Assessment:   This is a routine wellness examination for Gary Mcneil.  Hearing/Vision screen Hearing Screening Comments: Pt has not had a hearing screen in several years. Denies any problems with hearing at this time.  Vision Screening Comments: Last eye exam done last year. Hx of cataracts.   Dietary issues and exercise activities discussed: Current Exercise Habits: The patient does not participate in regular exercise at present, Exercise limited by: None identified  Goals    . Increase water intake          Starting 12/26/16, I will attempt to increase my water intake to 48 oz. Per day.       Depression Screen PHQ 2/9 Scores  12/26/2016 05/05/2016 11/02/2014 08/12/2014 12/18/2013  PHQ - 2 Score 0 0 0 0 0    Fall Risk Fall Risk  12/26/2016 05/05/2016 02/02/2016 01/04/2016 05/18/2015  Falls in the past year? No No No No No  Number falls in past yr: - - - - -  Risk for fall due to : History of fall(s) - - - -    Cognitive Function: MMSE - Mini Mental State Exam 05/05/2016 02/25/2015 05/20/2014 12/18/2013  Orientation to time 2 1 2 4   Orientation to Place 5 3 5 3   Registration 3 3 3 3   Attention/ Calculation 5 5 5 5   Recall 3 0 1 2  Language- name 2 objects 2 2 2 2   Language- repeat 1 1 1  1  Language- follow 3 step command 3 3 3 3   Language- read & follow direction 1 1 1 1   Write a sentence 1 1 1 1   Copy design 1 0 1 1  Copy design-comments - Unable to see clearly  - -  Total score 27 20 25 26         Screening Tests Health Maintenance  Topic Date Due  . COLONOSCOPY  04/13/2010  . PNA vac Low Risk Adult (2 of 2 - PCV13) 04/26/2016  . INFLUENZA VACCINE  06/20/2016  . HEMOGLOBIN A1C  11/04/2016  . ZOSTAVAX  12/21/2017 (Originally 02/19/2003)  . OPHTHALMOLOGY EXAM  12/29/2016  . FOOT EXAM  05/05/2017  . TETANUS/TDAP  04/13/2022      Plan:    I have personally reviewed and addressed the Medicare Annual Wellness questionnaire and have noted the following in the patient's chart:  A. Medical and social history B. Use of alcohol, tobacco or illicit drugs  C. Current medications and supplements D. Functional ability and status E.  Nutritional status F.  Physical activity G. Advance directives H. List of other physicians I.  Hospitalizations, surgeries, and ER visits in previous 12 months J.  Vitals K. Screenings to include hearing, vision, cognitive, depression L. Referrals and appointments - none  In addition, I have reviewed and discussed with patient certain preventive protocols, quality metrics, and best practice recommendations. A written personalized care plan for preventive services as well as general  preventive health recommendations were provided to patient.  See attached scanned questionnaire for additional information.   Signed,   Nilda CalamityAlisa McCutcheon, LPN Health Advisor    I reviewed health advisors note, was available for consultation and agree with documentation and plan.  Janene HarveyJessica K. Biagio Borgubanks, AGNP  [facility] [phone](Monday-Friday 8 am - 5 pm) [phone] (after hours)

## 2016-12-26 NOTE — Patient Instructions (Addendum)
[name] , Thank you for taking time to come for your Medicare Wellness Visit. I appreciate your ongoing commitment to your health goals. Please review the following plan we discussed and let me know if I can assist you in the future.   These are the goals we discussed: Goals    . Increase water intake          Starting 12/26/16, I will attempt to increase my water intake to 48 oz. Per day.        This is a list of the screening recommended for you and due dates:  Health Maintenance  Topic Date Due  . Colon Cancer Screening  04/13/2010  . Hemoglobin A1C  11/04/2016  . Flu Shot  12/21/2017*  . Shingles Vaccine  12/21/2017*  . Eye exam for diabetics  12/29/2016  . Complete foot exam   05/05/2017  . Tetanus Vaccine  04/13/2022  . Pneumonia vaccines  Completed  *Topic was postponed. The date shown is not the original due date.  Preventive Care for Adults  A healthy lifestyle and preventive care can promote health and wellness. Preventive health guidelines for adults include the following key practices.  . A routine yearly physical is a good way to check with your health care provider about your health and preventive screening. It is a chance to share any concerns and updates on your health and to receive a thorough exam.  . Visit your dentist for a routine exam and preventive care every 6 months. Brush your teeth twice a day and floss once a day. Good oral hygiene prevents tooth decay and gum disease.  . The frequency of eye exams is based on your age, health, family medical history, use  of contact lenses, and other factors. Follow your health care provider's ecommendations for frequency of eye exams.  . Eat a healthy diet. Foods like vegetables, fruits, whole grains, low-fat dairy products, and lean protein foods contain the nutrients you need without too many calories. Decrease your intake of foods high in solid fats, added sugars, and salt. Eat the right amount of calories for you.  Get information about a proper diet from your health care provider, if necessary.  . Regular physical exercise is one of the most important things you can do for your health. Most adults should get at least 150 minutes of moderate-intensity exercise (any activity that increases your heart rate and causes you to sweat) each week. In addition, most adults need muscle-strengthening exercises on 2 or more days a week.  Silver Sneakers may be a benefit available to you. To determine eligibility, you may visit the website: www.silversneakers.com or contact program at [phone] Mon-Fri between 8AM-8PM.   . Maintain a healthy weight. The body mass index (BMI) is a screening tool to identify possible weight problems. It provides an estimate of body fat based on height and weight. Your health care provider can find your BMI and can help you achieve or maintain a healthy weight.   For adults 20 years and older: ? A BMI below 18.5 is considered underweight. ? A BMI of 18.5 to 24.9 is normal. ? A BMI of 25 to 29.9 is considered overweight. ? A BMI of 30 and above is considered obese.   . Maintain normal blood lipids and cholesterol levels by exercising and minimizing your intake of saturated fat. Eat a balanced diet with plenty of fruit and vegetables. Blood tests for lipids and cholesterol should begin at age 74 and be  repeated every 5 years. If your lipid or cholesterol levels are high, you are over 50, or you are at high risk for heart disease, you may need your cholesterol levels checked more frequently. Ongoing high lipid and cholesterol levels should be treated with medicines if diet and exercise are not working.  . If you smoke, find out from your health care provider how to quit. If you do not use tobacco, please do not start.  . If you choose to drink alcohol, please do not consume more than 2 drinks per day. One drink is considered to be 12 ounces (355 mL) of beer, 5 ounces (148 mL) of wine, or  1.5 ounces (44 mL) of liquor.  . If you are 38-62 years old, ask your health care provider if you should take aspirin to prevent strokes.  . Use sunscreen. Apply sunscreen liberally and repeatedly throughout the day. You should seek shade when your shadow is shorter than you. Protect yourself by wearing long sleeves, pants, a wide-brimmed hat, and sunglasses year round, whenever you are outdoors.  . Once a month, do a whole body skin exam, using a mirror to look at the skin on your back. Tell your health care provider of new moles, moles that have irregular borders, moles that are larger than a pencil eraser, or moles that have changed in shape or color.

## 2016-12-27 ENCOUNTER — Telehealth: Payer: Self-pay

## 2016-12-27 NOTE — Telephone Encounter (Signed)
Per Sharon SellerEUBANKS, JESSICA K, NP patient needs an appointment prior to any refills. Last OV June 2017.   Spoke with patient's son, scheduled appointment for next Thursday 01/04/17 @ 3:45 pm. Patient's son thought patient was seeing Shanda BumpsJessica yesterday vs Nurse Health Advisor. Patient verbalized understanding that ov is need to continue with refills.

## 2016-12-27 NOTE — Telephone Encounter (Signed)
Thank you[facility]

## 2017-01-04 ENCOUNTER — Ambulatory Visit (INDEPENDENT_AMBULATORY_CARE_PROVIDER_SITE_OTHER): Payer: Medicare Other | Admitting: Nurse Practitioner

## 2017-01-04 VITALS — BP 132/88 | HR 82 | Temp 97.8°F | Ht 73.0 in | Wt 191.0 lb

## 2017-01-04 DIAGNOSIS — E43 Unspecified severe protein-calorie malnutrition: Secondary | ICD-10-CM | POA: Diagnosis not present

## 2017-01-04 DIAGNOSIS — M109 Gout, unspecified: Secondary | ICD-10-CM | POA: Diagnosis not present

## 2017-01-04 DIAGNOSIS — I10 Essential (primary) hypertension: Secondary | ICD-10-CM

## 2017-01-04 DIAGNOSIS — E1140 Type 2 diabetes mellitus with diabetic neuropathy, unspecified: Secondary | ICD-10-CM

## 2017-01-04 DIAGNOSIS — E782 Mixed hyperlipidemia: Secondary | ICD-10-CM

## 2017-01-04 DIAGNOSIS — F0150 Vascular dementia without behavioral disturbance: Secondary | ICD-10-CM

## 2017-01-04 DIAGNOSIS — R918 Other nonspecific abnormal finding of lung field: Secondary | ICD-10-CM

## 2017-01-04 LAB — CBC WITH DIFFERENTIAL/PLATELET
BASOS ABS: 66 {cells}/uL (ref 0–200)
BASOS PCT: 1 %
EOS ABS: 198 {cells}/uL (ref 15–500)
Eosinophils Relative: 3 %
HCT: 39.9 % (ref 38.5–50.0)
HEMOGLOBIN: 13.6 g/dL (ref 13.2–17.1)
LYMPHS ABS: 2310 {cells}/uL (ref 850–3900)
Lymphocytes Relative: 35 %
MCH: 28.7 pg (ref 27.0–33.0)
MCHC: 34.1 g/dL (ref 32.0–36.0)
MCV: 84.2 fL (ref 80.0–100.0)
MONOS PCT: 9 %
MPV: 9.6 fL (ref 7.5–12.5)
Monocytes Absolute: 594 cells/uL (ref 200–950)
NEUTROS ABS: 3432 {cells}/uL (ref 1500–7800)
Neutrophils Relative %: 52 %
PLATELETS: 176 10*3/uL (ref 140–400)
RBC: 4.74 MIL/uL (ref 4.20–5.80)
RDW: 15.4 % — ABNORMAL HIGH (ref 11.0–15.0)
WBC: 6.6 10*3/uL (ref 3.8–10.8)

## 2017-01-04 LAB — COMPLETE METABOLIC PANEL WITH GFR
ALT: 20 U/L (ref 9–46)
AST: 19 U/L (ref 10–35)
Albumin: 4.2 g/dL (ref 3.6–5.1)
Alkaline Phosphatase: 62 U/L (ref 40–115)
BILIRUBIN TOTAL: 0.6 mg/dL (ref 0.2–1.2)
BUN: 12 mg/dL (ref 7–25)
CHLORIDE: 107 mmol/L (ref 98–110)
CO2: 23 mmol/L (ref 20–31)
Calcium: 9.4 mg/dL (ref 8.6–10.3)
Creat: 1.03 mg/dL (ref 0.70–1.18)
GFR, EST AFRICAN AMERICAN: 83 mL/min (ref >=60)
GFR, EST NON AFRICAN AMERICAN: 72 mL/min (ref >=60)
GLUCOSE: 127 mg/dL — AB (ref 65–99)
POTASSIUM: 4 mmol/L (ref 3.5–5.3)
SODIUM: 140 mmol/L (ref 135–146)
TOTAL PROTEIN: 6.8 g/dL (ref 6.1–8.1)

## 2017-01-04 LAB — URIC ACID: URIC ACID, SERUM: 8.9 mg/dL — AB (ref 4.0–8.0)

## 2017-01-04 NOTE — Patient Instructions (Signed)
To get chest xray to follow up lungs

## 2017-01-04 NOTE — Progress Notes (Signed)
Careteam: Patient Care Team: Lauree Chandler, NP as PCP - General (Nurse Practitioner)   Allergies  Allergen Reactions  . Penicillins Rash    Chief Complaint  Patient presents with  . Medical Management of Chronic Issues    8 month follow-up and refill medications, here with son Jayan Raymundo  . Gout    Last Uric Acid level 2016, patient with flare up 3 weeks ago. Pain in feet knees and hands off/on       HPI: Patient is a 76 y.o. male seen in the office today for routine follow up. Has not been here in several month. Pt with hx of gout, OA, htn, dementia.  Pt lives alone, here with son.  Had gout flare 3 weeks ago, son contributes this to dehydration- he does not like to drink water. Last uric acid level was 5.0 in 2016.  Blood sugars- unsure what the values are, one time took too much medication and had an hypoglycemic episode, no recent hypoglycemic episode.  Had cataracts removed last year, has not gotten lost since then.  Son reports he was leaving the stove on but not any more.   Review of Systems:  Review of Systems  Unable to perform ROS: Dementia    Past Medical History:  Diagnosis Date  . "walking corpse" syndrome   . Acute kidney failure, unspecified   . Acute kidney failure, unspecified   . Anemia, unspecified   . Arthritis   . DEMENTIA   . Dementia 04/24/2012  . Disorder of bone and cartilage, unspecified   . Dizziness and giddiness   . GERD (gastroesophageal reflux disease)   . Gout   . Gout, unspecified   . Hypertension   . Hypopotassemia   . Hypotension, unspecified   . Hypotension, unspecified   . Memory loss   . Memory loss   . Muscle weakness (generalized)   . Myalgia and myositis, unspecified   . Neuromuscular disorder ([place])   . Other and unspecified hyperlipidemia   . Screening for ischemic heart disease   . Tobacco use disorder   . Type II or unspecified type diabetes mellitus without mention of complication, uncontrolled   . Type II  or unspecified type diabetes mellitus without mention of complication, uncontrolled   . Unspecified hereditary and idiopathic peripheral neuropathy   . Unspecified vitamin D deficiency    Past Surgical History:  Procedure Laterality Date  . INGUINAL HERNIA REPAIR    . VIDEO ASSISTED THORACOSCOPY (VATS)/EMPYEMA Right 04/30/2015   Procedure: VIDEO ASSISTED THORACOSCOPY (VATS)/DRAIN EMPYEMA;  Surgeon: Ivin Poot, MD;  Location: [facility];  Service: Thoracic;  Laterality: Right;  [name] VIDEO BRONCHOSCOPY N/A 04/30/2015   Procedure: VIDEO BRONCHOSCOPY;  Surgeon: Ivin Poot, MD;  Location: [facility] OR;  Service: Thoracic;  Laterality: N/A;   Social History:   reports that he quit smoking about 47 years ago. He has never used smokeless tobacco. He reports that he does not drink alcohol or use drugs.  Family History  Problem Relation Age of Onset  . Diabetes Mother   . Hypertension Mother   . Diabetes Father   . Hypertension Father   . Kidney disease Father   . Diabetes Sister   . Kidney disease Brother     Medications: Patient's Medications  New Prescriptions   No medications on file  Previous Medications   ALLOPURINOL (ZYLOPRIM) 100 MG TABLET    Take 2 tablets (200 mg total) by mouth daily.   ATORVASTATIN (  LIPITOR) 20 MG TABLET    TAKE 1 TABLET BY MOUTH DAILY AT 6 PM   HYDROCHLOROTHIAZIDE (HYDRODIURIL) 25 MG TABLET    TAKE 1 TABLET BY MOUTH DAILY   LOSARTAN (COZAAR) 25 MG TABLET    TAKE 1 TABLET(25 MG) BY MOUTH DAILY   MEMANTINE HCL-DONEPEZIL HCL (NAMZARIC) 28-10 MG CP24    Take 1 capsule by mouth daily.   MULTIPLE VITAMIN (MULTIVITAMIN WITH MINERALS) TABS TABLET    Take 1 tablet by mouth daily.   NAMENDA XR 28 MG CP24 24 HR CAPSULE    TAKE 1 CAPSULE BY MOUTH DAILY   POTASSIUM CHLORIDE SA (K-DUR,KLOR-CON) 20 MEQ TABLET    Take 2 tablets (40 mEq total) by mouth 2 (two) times daily.   TRADJENTA 5 MG TABS TABLET    TAKE 1 TABLET(5 MG) BY MOUTH DAILY  Modified Medications   No medications on  file  Discontinued Medications   No medications on file     Physical Exam:  Vitals:   01/04/17 1516  BP: 132/88  Pulse: 82  Temp: 97.8 F (36.6 C)  TempSrc: Oral  SpO2: 96%  Weight: 191 lb (86.6 kg)  Height: 6' 1" (1.854 m)   Body mass index is 25.2 kg/m.  Physical Exam  Constitutional: No distress.  HENT:  Head: Normocephalic and atraumatic.  Nose: Nose normal.  Mouth/Throat: Oropharynx is clear and moist. No oropharyngeal exudate.  Neck: Normal range of motion. Neck supple.  Cardiovascular: Normal rate, regular rhythm and normal heart sounds.   Pulmonary/Chest: Effort normal and breath sounds normal.  Abdominal: Soft. Bowel sounds are normal.  Musculoskeletal: Normal range of motion. He exhibits no edema.  Neurological: He is alert.  confused  Skin: Skin is warm and dry. He is not diaphoretic.  Psychiatric: He has a normal mood and affect.    Labs reviewed: Basic Metabolic Panel:  Recent Labs  01/31/16 0830 05/05/16 1554  NA 142 142  K 4.1 3.8  CL 102 100  CO2 22 22  GLUCOSE 125* 152*  BUN 11 12  CREATININE 1.14 1.20  CALCIUM 9.5 9.9  TSH  --  2.390   Liver Function Tests:  Recent Labs  05/05/16 1554  AST 45*  ALT 43  ALKPHOS 82  BILITOT 0.7  PROT 6.8  ALBUMIN 4.5   No results for input(s): LIPASE, AMYLASE in the last 8760 hours. No results for input(s): AMMONIA in the last 8760 hours. CBC: No results for input(s): WBC, NEUTROABS, HGB, HCT, MCV, PLT in the last 8760 hours. Lipid Panel:  Recent Labs  05/05/16 1554  CHOL 188  HDL 62  LDLCALC 59  TRIG 333*  CHOLHDL 3.0   TSH:  Recent Labs  05/05/16 1554  TSH 2.390   A1C: Lab Results  Component Value Date   HGBA1C 8.1 (H) 05/05/2016   MMSE - Mini Mental State Exam 12/26/2016 05/05/2016 02/25/2015  Orientation to time 2 2 1  Orientation to Place 4 5 3  Registration 3 3 3  Attention/ Calculation 5 5 5  Recall 2 3 0  Language- name 2 objects 2 2 2  Language- repeat 1 1 1    Language- follow 3 step command 3 3 3  Language- read & follow direction 1 1 1  Write a sentence 1 1 1  Copy design 1 1 0  Copy design-comments - - Unable to see clearly   Total score 25 27 20    Assessment/Plan 1. Essential hypertension Blood pressure stable, cont   current regmen  2. Type 2 diabetes mellitus with diabetic neuropathy, without long-term current use of insulin (HCC) -conts on tradjenta 5 mg daily  - CMP with eGFR - CBC with Differential/Platelets - Hemoglobin A1c  3. Vascular dementia without behavioral disturbance -conts on namzaric, namenda also on medication list, should only be taking namzaric, son to look through medication and clarify this.  -discussed pts driving with son, he feels like he is a safe driver and does not get lost.   4. Gout of multiple sites, unspecified cause, unspecified chronicity Gout flare 3 weeks ago. Taking allpurinoal 100 mg daily  - Uric acid  5. Mixed hyperlipidemia LDL at goal in June.   6. Protein-calorie malnutrition, severe (HCC) Improved, weight is stable. Eating well per son.  7. Right lower lobe lung mass -pt with hx of loculated right pleural effusion and probable infiltrate right lower lobe. VATS done but pt never followed up with Dr Prescott Gum, son reports he has not been having issues so did not follow up. Will follow up xray at this time.  - DG Chest 2 View   [place] K. Harle Battiest  [facility] [phone] 8 am - 5 pm) [phone] (after hours)

## 2017-01-05 LAB — HEMOGLOBIN A1C
Hgb A1c MFr Bld: 7.7 % — ABNORMAL HIGH (ref <5.7)
Mean Plasma Glucose: 174 mg/dL

## 2017-01-11 ENCOUNTER — Other Ambulatory Visit: Payer: Self-pay

## 2017-01-11 MED ORDER — MEMANTINE HCL-DONEPEZIL HCL ER 28-10 MG PO CP24: 1.0000 | ORAL_CAPSULE | Freq: Every day | ORAL | 6 refills | Status: DC

## 2017-01-16 ENCOUNTER — Other Ambulatory Visit: Payer: Self-pay | Admitting: Nurse Practitioner

## 2017-01-16 ENCOUNTER — Telehealth: Payer: Self-pay

## 2017-01-16 NOTE — Patient Instructions (Signed)
c 

## 2017-01-16 NOTE — Telephone Encounter (Signed)
-----   Message from Sharon SellerJessica K Eubanks, NP sent at 01/16/2017 11:40 AM EST ----- Please call son and remind him that Mr Julious OkaLilly needed follow up chest xray ----- Message ----- From: SYSTEM Sent: 01/09/2017  12:06 AM To: Sharon SellerJessica K Eubanks, NP

## 2017-01-16 NOTE — Telephone Encounter (Signed)
I called patient's son to remind him that patient has a chest x ray ordered. Mr. Tonna CornerLily stated that he would take patient today or Thursday to have xray done.

## 2017-01-24 ENCOUNTER — Other Ambulatory Visit: Payer: Self-pay | Admitting: Nurse Practitioner

## 2017-01-24 ENCOUNTER — Other Ambulatory Visit: Payer: Self-pay | Admitting: Internal Medicine

## 2017-02-15 ENCOUNTER — Other Ambulatory Visit: Payer: Self-pay | Admitting: Nurse Practitioner

## 2017-03-16 ENCOUNTER — Other Ambulatory Visit: Payer: Self-pay | Admitting: Nurse Practitioner

## 2017-03-17 ENCOUNTER — Other Ambulatory Visit: Payer: Self-pay | Admitting: Nurse Practitioner

## 2017-04-15 ENCOUNTER — Other Ambulatory Visit: Payer: Self-pay | Admitting: Nurse Practitioner

## 2017-04-15 DIAGNOSIS — E1140 Type 2 diabetes mellitus with diabetic neuropathy, unspecified: Secondary | ICD-10-CM

## 2017-05-22 ENCOUNTER — Telehealth: Payer: Self-pay

## 2017-05-22 NOTE — Telephone Encounter (Signed)
Received fax from [facility] that they cancelled the order on patient for cologuard. Was cancelled because it has been in an Inactive Order Status and has exceeded 365 days from the initial order. Was ordered 05/05/16  By Dr. Montez Moritaarter. Given to Dr. Montez Moritaarter.

## 2017-06-20 ENCOUNTER — Other Ambulatory Visit: Payer: Self-pay | Admitting: Nurse Practitioner

## 2017-09-03 ENCOUNTER — Ambulatory Visit: Payer: Medicare Other | Admitting: Nurse Practitioner

## 2017-09-04 ENCOUNTER — Ambulatory Visit: Payer: Medicare Other | Admitting: Nurse Practitioner

## 2017-09-05 ENCOUNTER — Ambulatory Visit: Payer: Medicare Other | Admitting: Nurse Practitioner

## 2017-09-10 ENCOUNTER — Ambulatory Visit (INDEPENDENT_AMBULATORY_CARE_PROVIDER_SITE_OTHER): Payer: Medicare Other | Admitting: Nurse Practitioner

## 2017-09-10 ENCOUNTER — Ambulatory Visit: Payer: Medicare Other | Admitting: Nurse Practitioner

## 2017-09-10 ENCOUNTER — Encounter: Payer: Self-pay | Admitting: Nurse Practitioner

## 2017-09-10 VITALS — BP 158/88 | HR 80 | Temp 98.1°F | Resp 18 | Ht 73.0 in | Wt 188.6 lb

## 2017-09-10 DIAGNOSIS — M109 Gout, unspecified: Secondary | ICD-10-CM

## 2017-09-10 DIAGNOSIS — F0150 Vascular dementia without behavioral disturbance: Secondary | ICD-10-CM | POA: Diagnosis not present

## 2017-09-10 DIAGNOSIS — E1140 Type 2 diabetes mellitus with diabetic neuropathy, unspecified: Secondary | ICD-10-CM | POA: Diagnosis not present

## 2017-09-10 DIAGNOSIS — E782 Mixed hyperlipidemia: Secondary | ICD-10-CM

## 2017-09-10 DIAGNOSIS — R918 Other nonspecific abnormal finding of lung field: Secondary | ICD-10-CM

## 2017-09-10 DIAGNOSIS — Z23 Encounter for immunization: Secondary | ICD-10-CM | POA: Diagnosis not present

## 2017-09-10 DIAGNOSIS — I10 Essential (primary) hypertension: Secondary | ICD-10-CM | POA: Diagnosis not present

## 2017-09-10 LAB — LIPID PANEL
CHOL/HDL RATIO: 3 (calc) (ref <5.0)
Cholesterol: 177 mg/dL (ref <200)
HDL: 59 mg/dL (ref >40)
LDL CHOLESTEROL (CALC): 91 mg/dL
NON-HDL CHOLESTEROL (CALC): 118 mg/dL (ref <130)
Triglycerides: 160 mg/dL — ABNORMAL HIGH (ref <150)

## 2017-09-10 NOTE — Patient Instructions (Signed)
To get xray for evaluation on knot on chest and to follow up lungs

## 2017-09-10 NOTE — Progress Notes (Addendum)
Careteam: Patient Care Team: Lauree Chandler, NP as PCP - General (Nurse Practitioner)  Advanced Directive information Does Patient Have a Medical Advance Directive?: Yes, Type of Advance Directive: Healthcare Power of Attorney  Allergies  Allergen Reactions  . Penicillins Rash    Chief Complaint  Patient presents with  . Acute Visit    Pt is being seen due to swelling/knot on front, base of throat x 2 weeks. Pt states that it is painful (4 out of 10 pain scale)   . Other    Pt son in room.     HPI: Patient is a 76 y.o. male seen in the office today due to lump on right neck x 2 weeks. Patient states he has tried taking aspirin/tyelnol  for the pain and it helps. Denies aggravating factors. He rates the pain at 4/10. His son states the lump has gotten smaller in the past week and basically resolved. When he ordinally made the appt it was bigger but due to power outage the appt had to be rescheduled and now it has improved.   Denies fever, chills, nausea, vomiting. He denies shortness of breath, or problems swallowing and eating.  Patient never followed up in February 2018 for chest x-ray. Patient has history of a lung mass son reports they have not had a problem so did not think it was necessary  HTN - patient's son does not think he is taking his BP medication today, normally checks on him daily but has not checked on him in 2 days and they were in a hurry to make appt. Blood pressure on recheck 158/88  Hyperlipidemia - Patient's son states his appetite has been good, but he does not eat healthy. Diet consists off burgers and fried food.   Dementia - Patient prescribed Namzaric for memory loss. Cont to drive to local places. Does not cook   DM - Patient taking Tradjenta to control blood sugar.  Right lower lung mass - Patient did not follow up to get ordered chest x-ray in Februrary 2018.  Review of Systems:  Review of Systems  Unable to perform ROS: Dementia     Past Medical History:  Diagnosis Date  . "walking corpse" syndrome   . Acute kidney failure, unspecified ([place])   . Acute kidney failure, unspecified ([place])   . Anemia, unspecified   . Arthritis   . DEMENTIA   . Dementia 04/24/2012  . Disorder of bone and cartilage, unspecified   . Dizziness and giddiness   . GERD (gastroesophageal reflux disease)   . Gout   . Gout, unspecified   . Hypertension   . Hypopotassemia   . Hypotension, unspecified   . Hypotension, unspecified   . Memory loss   . Memory loss   . Muscle weakness (generalized)   . Myalgia and myositis, unspecified   . Neuromuscular disorder ([place])   . Other and unspecified hyperlipidemia   . Screening for ischemic heart disease   . Tobacco use disorder   . Type II or unspecified type diabetes mellitus without mention of complication, uncontrolled   . Type II or unspecified type diabetes mellitus without mention of complication, uncontrolled   . Unspecified hereditary and idiopathic peripheral neuropathy   . Unspecified vitamin D deficiency    Past Surgical History:  Procedure Laterality Date  . INGUINAL HERNIA REPAIR    . VIDEO ASSISTED THORACOSCOPY (VATS)/EMPYEMA Right 04/30/2015   Procedure: VIDEO ASSISTED THORACOSCOPY (VATS)/DRAIN EMPYEMA;  Surgeon: Ivin Poot, MD;  Location: [facility] OR;  Service: Thoracic;  Laterality: Right;  [name] VIDEO BRONCHOSCOPY N/A 04/30/2015   Procedure: VIDEO BRONCHOSCOPY;  Surgeon: Ivin Poot, MD;  Location: [facility] OR;  Service: Thoracic;  Laterality: N/A;   Social History:   reports that he quit smoking about 47 years ago. He has never used smokeless tobacco. He reports that he does not drink alcohol or use drugs.  Family History  Problem Relation Age of Onset  . Diabetes Mother   . Hypertension Mother   . Diabetes Father   . Hypertension Father   . Kidney disease Father   . Diabetes Sister   . Kidney disease Brother     Medications: Patient's Medications  New Prescriptions    No medications on file  Previous Medications   ALLOPURINOL (ZYLOPRIM) 100 MG TABLET    Take 2 tablets (200 mg total) by mouth daily.   ATORVASTATIN (LIPITOR) 20 MG TABLET    TAKE 1 TABLET BY MOUTH DAILY AT 6 PM   HYDROCHLOROTHIAZIDE (HYDRODIURIL) 25 MG TABLET    TAKE 1 TABLET BY MOUTH DAILY   LOSARTAN (COZAAR) 25 MG TABLET    TAKE 1 TABLET(25 MG) BY MOUTH DAILY   MEMANTINE HCL-DONEPEZIL HCL (NAMZARIC) 28-10 MG CP24    Take 1 capsule by mouth daily.   MULTIPLE VITAMIN (MULTIVITAMIN WITH MINERALS) TABS TABLET    Take 1 tablet by mouth daily.   POTASSIUM CHLORIDE SA (K-DUR,KLOR-CON) 20 MEQ TABLET    TAKE 2 TABLETS(40 MEQ) BY MOUTH TWICE DAILY   TRADJENTA 5 MG TABS TABLET    TAKE 1 TABLET(5 MG) BY MOUTH DAILY  Modified Medications   No medications on file  Discontinued Medications   No medications on file     Physical Exam:  Vitals:   09/10/17 1517 09/10/17 1617  BP: (!) 182/94 (!) 158/88  Pulse: 80   Resp: 18   Temp: 98.1 F (36.7 C)   TempSrc: Oral   SpO2: 97%   Weight: 188 lb 9.6 oz (85.5 kg)   Height: _0  (1.854 m)    Body mass index is 24.88 kg/m.  Physical Exam  Constitutional: He appears well-developed and well-nourished. No distress.  HENT:  Head: Normocephalic.  Right Ear: External ear normal.  Left Ear: External ear normal.  Nose: Nose normal.  Mouth/Throat: No oropharyngeal exudate.  Eyes: Pupils are equal, round, and reactive to light. Conjunctivae and EOM are normal. Right eye exhibits no discharge. Left eye exhibits no discharge.  Neck: Normal range of motion. Neck supple. No JVD present. No thyromegaly present.    Knot noted with tenderness on palpation to medial clavicle over SI joint, with mild swelling over posterior sternum, no redness or heat  Cardiovascular: Normal rate, regular rhythm, normal heart sounds and intact distal pulses.   No murmur heard. Pulmonary/Chest: Effort normal and breath sounds normal. No respiratory distress. He has no wheezes.   Abdominal: Soft. Bowel sounds are normal. There is no tenderness.  Musculoskeletal: Normal range of motion. He exhibits no edema.  Lymphadenopathy:    He has no cervical adenopathy.  Neurological: He is alert. Disoriented: Oriented to person, place. No sensory deficit.  Skin: Skin is warm and dry. Capillary refill takes less than 2 seconds. He is not diaphoretic.  Psychiatric: He has a normal mood and affect.    Labs reviewed: Basic Metabolic Panel:  Recent Labs  01/04/17 1603  NA 140  K 4.0  CL 107  CO2 23  GLUCOSE 127*  BUN 12  CREATININE 1.03  CALCIUM 9.4   Liver Function Tests:  Recent Labs  01/04/17 1603  AST 19  ALT 20  ALKPHOS 62  BILITOT 0.6  PROT 6.8  ALBUMIN 4.2   No results for input(s): LIPASE, AMYLASE in the last 8760 hours. No results for input(s): AMMONIA in the last 8760 hours. CBC:  Recent Labs  01/04/17 1603  WBC 6.6  NEUTROABS 3,432  HGB 13.6  HCT 39.9  MCV 84.2  PLT 176   Lipid Panel: No results for input(s): CHOL, HDL, LDLCALC, TRIG, CHOLHDL, LDLDIRECT in the last 8760 hours. TSH: No results for input(s): TSH in the last 8760 hours. A1C: Lab Results  Component Value Date   HGBA1C 7.7 (H) 01/04/2017     Assessment/Plan 1. Essential hypertension -elevated today but son suspects he has not taken blood pressure medication. Will cont current regimen and check labs - CBC with Differential/Platelets  2. Right lower lobe lung mass Patient did not follow up for chest xray in February. Discussed with son that there could be no symptoms until things got bad/progression of disease. They stated they understood and would get chest xray - DG Chest 2 View  3. Vascular dementia without behavioral disturbance -ongoing, son checks on pt most days but he lives alone. conts on namzaric  - CMP with eGFR  4. Type 2 diabetes mellitus with diabetic neuropathy, without long-term current use of insulin (HCC) Fasting labs collected today. Patient  to continue Tradjenta. No hypoglycemics episodes - Hemoglobin A1c  5. Mixed hyperlipidemia Patient to continue taking Atorvastatin as ordered. - CMP with eGFR  6. Gout of multiple sites, unspecified cause, unspecified chronicity Continue taking allopurinol to control occurrence of gout. No acute flares  - Uric acid  7. Sternum swelling -mild swelling noted, most likely OA however can not rule out metastis from lung mass, discussed importance of follow up Chest xray with son. no falls noted, improvement per pt and son, will get chest xray to evaluate further   8. Flu shot given.   Next appt: 3 months, sooner if needed Ataya Murdy K. Harle Battiest  [facility] [phone] 8 am - 5 pm) [phone] (after hours)

## 2017-09-11 ENCOUNTER — Telehealth: Payer: Self-pay

## 2017-09-11 LAB — COMPLETE METABOLIC PANEL WITH GFR
AG Ratio: 1.8 (calc) (ref 1.0–2.5)
ALBUMIN MSPROF: 4.3 g/dL (ref 3.6–5.1)
ALT: 19 U/L (ref 9–46)
AST: 18 U/L (ref 10–35)
Alkaline phosphatase (APISO): 75 U/L (ref 40–115)
BILIRUBIN TOTAL: 0.5 mg/dL (ref 0.2–1.2)
BUN / CREAT RATIO: 10 (calc) (ref 6–22)
BUN: 12 mg/dL (ref 7–25)
CHLORIDE: 106 mmol/L (ref 98–110)
CO2: 27 mmol/L (ref 20–32)
CREATININE: 1.24 mg/dL — AB (ref 0.70–1.18)
Calcium: 9.5 mg/dL (ref 8.6–10.3)
GFR, EST AFRICAN AMERICAN: 66 mL/min/{1.73_m2} (ref >=60)
GFR, EST NON AFRICAN AMERICAN: 57 mL/min/{1.73_m2} — AB (ref >=60)
GLUCOSE: 116 mg/dL (ref 65–139)
Globulin: 2.4 g/dL (calc) (ref 1.9–3.7)
Potassium: 4 mmol/L (ref 3.5–5.3)
Sodium: 141 mmol/L (ref 135–146)
Total Protein: 6.7 g/dL (ref 6.1–8.1)

## 2017-09-11 LAB — CBC WITH DIFFERENTIAL/PLATELET
BASOS ABS: 50 {cells}/uL (ref 0–200)
Basophils Relative: 0.8 %
EOS ABS: 143 {cells}/uL (ref 15–500)
Eosinophils Relative: 2.3 %
HEMATOCRIT: 41.9 % (ref 38.5–50.0)
Hemoglobin: 14.2 g/dL (ref 13.2–17.1)
LYMPHS ABS: 1984 {cells}/uL (ref 850–3900)
MCH: 28.5 pg (ref 27.0–33.0)
MCHC: 33.9 g/dL (ref 32.0–36.0)
MCV: 84 fL (ref 80.0–100.0)
MPV: 9.7 fL (ref 7.5–12.5)
Monocytes Relative: 9.4 %
NEUTROS PCT: 55.5 %
Neutro Abs: 3441 cells/uL (ref 1500–7800)
Platelets: 278 10*3/uL (ref 140–400)
RBC: 4.99 10*6/uL (ref 4.20–5.80)
RDW: 16.1 % — AB (ref 11.0–15.0)
Total Lymphocyte: 32 %
WBC: 6.2 10*3/uL (ref 3.8–10.8)
WBCMIX: 583 {cells}/uL (ref 200–950)

## 2017-09-11 LAB — HEMOGLOBIN A1C
Hgb A1c MFr Bld: 7 % of total Hgb — ABNORMAL HIGH (ref <5.7)
MEAN PLASMA GLUCOSE: 154 (calc)
eAG (mmol/L): 8.5 (calc)

## 2017-09-11 LAB — URIC ACID: Uric Acid, Serum: 6.9 mg/dL (ref 4.0–8.0)

## 2017-09-11 MED ORDER — LOSARTAN POTASSIUM 50 MG PO TABS: 50.0000 mg | ORAL_TABLET | Freq: Every day | ORAL | 3 refills | Status: DC

## 2017-09-11 NOTE — Telephone Encounter (Signed)
-----   Message from Sharon SellerJessica K Eubanks, NP sent at 09/11/2017  8:55 AM EDT ----- Kidney function has declined since last visit. encourage proper hydration 6-8 8 oz glasses of water a day and to avoid NSAIDS (Aleve, Advil, Motrin, Ibuprofen).    This could be due to dehydration or blood pressure being elevated for a long period of time. Lets stop his HCTZ and increase his losartan to 50 mg daily. Please have his son bring him back in 2 weeks so we can recheck lab work and blood pressure- please make sure he takes his medication before his visit.

## 2017-09-11 NOTE — Telephone Encounter (Signed)
Medication list has been updated to reflect changes based on lab results. Patient's son verbalized understanding. Rx for losartan 50 mg was sent to pharmacy.

## 2017-09-17 ENCOUNTER — Telehealth: Payer: Self-pay

## 2017-09-17 NOTE — Telephone Encounter (Signed)
-----   Message from Sharon SellerJessica K Eubanks, NP sent at 09/17/2017 11:09 AM EDT ----- Can we please remind his son that he needs to take him to get this xray and re-stress the importance on following up on his lung nodule. It could not be showing any issues until late and that is why we do the xray  ----- Message ----- From: SYSTEM Sent: 09/15/2017  12:07 AM To: Sharon SellerJessica K Eubanks, NP

## 2017-09-17 NOTE — Telephone Encounter (Signed)
I spoke with patient's son who stated that they planned to go get xray tomorrow.

## 2017-09-17 NOTE — Telephone Encounter (Signed)
Noted thank you

## 2017-09-22 ENCOUNTER — Other Ambulatory Visit: Payer: Self-pay | Admitting: Nurse Practitioner

## 2017-09-24 ENCOUNTER — Other Ambulatory Visit: Payer: Self-pay | Admitting: Internal Medicine

## 2017-09-25 ENCOUNTER — Ambulatory Visit: Payer: Self-pay | Admitting: Nurse Practitioner

## 2017-10-25 ENCOUNTER — Other Ambulatory Visit: Payer: Self-pay | Admitting: Nurse Practitioner

## 2017-11-05 ENCOUNTER — Ambulatory Visit: Payer: Self-pay | Admitting: Nurse Practitioner

## 2017-11-26 ENCOUNTER — Other Ambulatory Visit: Payer: Self-pay | Admitting: Nurse Practitioner

## 2018-01-28 ENCOUNTER — Encounter: Payer: Self-pay | Admitting: Nurse Practitioner

## 2018-01-28 ENCOUNTER — Ambulatory Visit (INDEPENDENT_AMBULATORY_CARE_PROVIDER_SITE_OTHER): Payer: Medicare Other

## 2018-01-28 ENCOUNTER — Ambulatory Visit: Payer: Medicare Other | Admitting: Nurse Practitioner

## 2018-01-28 VITALS — BP 152/80 | HR 95 | Temp 98.4°F | Ht 73.0 in | Wt 191.0 lb

## 2018-01-28 DIAGNOSIS — I10 Essential (primary) hypertension: Secondary | ICD-10-CM | POA: Diagnosis not present

## 2018-01-28 DIAGNOSIS — E119 Type 2 diabetes mellitus without complications: Secondary | ICD-10-CM

## 2018-01-28 DIAGNOSIS — M1A9XX0 Chronic gout, unspecified, without tophus (tophi): Secondary | ICD-10-CM | POA: Diagnosis not present

## 2018-01-28 DIAGNOSIS — F0150 Vascular dementia without behavioral disturbance: Secondary | ICD-10-CM | POA: Diagnosis not present

## 2018-01-28 DIAGNOSIS — Z0000 Encounter for general adult medical examination without abnormal findings: Secondary | ICD-10-CM

## 2018-01-28 DIAGNOSIS — Z1211 Encounter for screening for malignant neoplasm of colon: Secondary | ICD-10-CM

## 2018-01-28 MED ORDER — ZOSTER VAC RECOMB ADJUVANTED 50 MCG/0.5ML IM SUSR: 0.5000 mL | Freq: Once | INTRAMUSCULAR | 1 refills | Status: AC

## 2018-01-28 MED ORDER — MEMANTINE HCL ER 28 MG PO CP24: 28.0000 mg | ORAL_CAPSULE | Freq: Every day | ORAL | 1 refills | Status: DC

## 2018-01-28 MED ORDER — DONEPEZIL HCL 10 MG PO TABS: 10.0000 mg | ORAL_TABLET | Freq: Every day | ORAL | 1 refills | Status: DC

## 2018-01-28 MED ORDER — LOSARTAN POTASSIUM 100 MG PO TABS: 100.0000 mg | ORAL_TABLET | Freq: Every day | ORAL | 0 refills | Status: DC

## 2018-01-28 NOTE — Patient Instructions (Addendum)
Complete namzaric then the next day use the aricept and namenda   To increase losartan to 100 mg daily for blood pressure  To follow up 2-3 weeks on blood pressure- will also need lab work to make sure kidneys and electrolytes are tolerating dose of medication  To follow up chest xray - order is good for the next month

## 2018-01-28 NOTE — Progress Notes (Signed)
Careteam: Patient Care Team: [name], NP as PCP - General (Nurse Practitioner)  Advanced Directive information    Allergies  Allergen Reactions  . Penicillins Rash    Chief Complaint  Patient presents with  . Medical Management of Chronic Issues    Pt is being seen for a routine visit.      HPI: Patient is a 77 y.o. male seen in the office today for routine follow up.  Has yet to get follow up chest xray. No shortness of breath, no weight loss. No cough or congestion.   Lives alone, MMSE 21/30, still driving but does not drive at night.  Drives back and forth to a place he lives to eat and that's pretty much all the driving he does.  Son rides in car with him and feels like he does well with driving.  Son helps with finances and overseeing things at his house.  Taking namzaric 28-10 mg daily  Gout- no recent flares, continues on allopurinol.   Hyperlipidemia- currently on Lipitor 20 mg daily, has not fasted today  htn- cozaar 50 mg daily for blood pressure, blood pressure remains elevated despite medication. Eats a lot of foods high in sodium. Does not prepare foods.   DM- taking tradjenta 5 mg daily    Review of Systems:  Review of Systems  Unable to perform ROS: Dementia    Past Medical History:  Diagnosis Date  . "walking corpse" syndrome   . Acute kidney failure, unspecified (HCC)   . Acute kidney failure, unspecified (HCC)   . Anemia, unspecified   . Arthritis   . Dementia   . Dementia 04/24/2012  . Disorder of bone and cartilage, unspecified   . Dizziness and giddiness   . GERD (gastroesophageal reflux disease)   . Gout   . Gout, unspecified   . Hypertension   . Hypopotassemia   . Hypotension, unspecified   . Hypotension, unspecified   . Memory loss   . Memory loss   . Muscle weakness (generalized)   . Myalgia and myositis, unspecified   . Neuromuscular disorder (HCC)   . Other and unspecified hyperlipidemia   . Screening for  ischemic heart disease   . Tobacco use disorder   . Type II or unspecified type diabetes mellitus without mention of complication, uncontrolled   . Type II or unspecified type diabetes mellitus without mention of complication, uncontrolled   . Unspecified hereditary and idiopathic peripheral neuropathy   . Unspecified vitamin D deficiency    Past Surgical History:  Procedure Laterality Date  . INGUINAL HERNIA REPAIR    . VIDEO ASSISTED THORACOSCOPY (VATS)/EMPYEMA Right 04/30/2015   Procedure: VIDEO ASSISTED THORACOSCOPY (VATS)/DRAIN EMPYEMA;  Surgeon: [name], MD;  Location: [facility] OR;  Service: Thoracic;  Laterality: Right;  [name] VIDEO BRONCHOSCOPY N/A 04/30/2015   Procedure: VIDEO BRONCHOSCOPY;  Surgeon: [name], MD;  Location: [facility] OR;  Service: Thoracic;  Laterality: N/A;   Social History:   reports that he quit smoking about 48 years ago. he has never used smokeless tobacco. He reports that he does not drink alcohol or use drugs.  Family History  Problem Relation Age of Onset  . Diabetes Mother   . Hypertension Mother   . Diabetes Father   . Hypertension Father   . Kidney disease Father   . Diabetes Sister   . Kidney disease Brother     Medications: Patient's Medications  New Prescriptions   No medications on file  Previous Medications   ALLOPURINOL (ZYLOPRIM) 100 MG TABLET    Take 2 tablets (200 mg total) by mouth daily.   ATORVASTATIN (LIPITOR) 20 MG TABLET    TAKE 1 TABLET BY MOUTH DAILY AT 6 PM   LOSARTAN (COZAAR) 50 MG TABLET    Take 1 tablet (50 mg total) by mouth daily.   MULTIPLE VITAMIN (MULTIVITAMIN WITH MINERALS) TABS TABLET    Take 1 tablet by mouth daily.   NAMZARIC 28-10 MG CP24    TAKE 1 CAPSULE BY MOUTH DAILY   POTASSIUM CHLORIDE SA (K-DUR,KLOR-CON) 20 MEQ TABLET    TAKE 2 TABLETS(40 MEQ) BY MOUTH TWICE DAILY   TRADJENTA 5 MG TABS TABLET    TAKE 1 TABLET(5 MG) BY MOUTH DAILY   ZOSTER VACCINE ADJUVANTED (SHINGRIX) INJECTION    Inject 0.5 mLs into the  muscle once for 1 dose.  Modified Medications   No medications on file  Discontinued Medications   No medications on file     Physical Exam:  Vitals:   01/28/18 1508  BP: (!) 152/80  Pulse: 95  Temp: 98.4 F (36.9 C)  TempSrc: Oral  SpO2: 96%  Weight: 191 lb (86.6 kg)  Height: 6\' 1"  (1.854 m)   Body mass index is 25.2 kg/m.  Physical Exam  Constitutional: He appears well-developed and well-nourished. No distress.  HENT:  Head: Normocephalic and atraumatic.  Nose: Nose normal.  Mouth/Throat: Oropharynx is clear and moist. No oropharyngeal exudate.  Eyes: Pupils are equal, round, and reactive to light.  Neck: Normal range of motion. Neck supple.  Cardiovascular: Normal rate, regular rhythm and normal heart sounds.  Pulmonary/Chest: Effort normal and breath sounds normal.  Abdominal: Soft. Bowel sounds are normal.  Musculoskeletal: Normal range of motion. He exhibits no edema.  Neurological: He is alert.  March 2019- MMSE 21/30  Skin: Skin is warm and dry. He is not diaphoretic.  Psychiatric: He has a normal mood and affect.    Labs reviewed: Basic Metabolic Panel: Recent Labs    09/10/17 1614  NA 141  K 4.0  CL 106  CO2 27  GLUCOSE 116  BUN 12  CREATININE 1.24*  CALCIUM 9.5   Liver Function Tests: Recent Labs    09/10/17 1614  AST 18  ALT 19  BILITOT 0.5  PROT 6.7   No results for input(s): LIPASE, AMYLASE in the last 8760 hours. No results for input(s): AMMONIA in the last 8760 hours. CBC: Recent Labs    09/10/17 1614  WBC 6.2  NEUTROABS 3,441  HGB 14.2  HCT 41.9  MCV 84.0  PLT 278   Lipid Panel: Recent Labs    09/10/17 1614  CHOL 177  HDL 59  TRIG 160*  CHOLHDL 3.0   TSH: No results for input(s): TSH in the last 8760 hours. A1C: Lab Results  Component Value Date   HGBA1C 7.0 (H) 09/10/2017     Assessment/Plan 1. Essential hypertension -elevated, eats foods high in sodium, encouraged dash diet. Will increase losartan to  100 mg daily  - COMPLETE METABOLIC PANEL WITH GFR - CBC with Differential/Platelets - losartan (COZAAR) 100 MG tablet; Take 1 tablet (100 mg total) by mouth daily.  Dispense: 90 tablet; Refill: 0  2. Type 2 diabetes mellitus without complication, without long-term current use of insulin (HCC) -continues on tradjenta 5 mg daily, not compliant with diet.  - Hemoglobin A1c  3. Vascular dementia without behavioral disturbance -progressive decline, son reports he is still very independent and doing  well living alone. Son manages his finances. Still driving back and forth to a place close to his home to eat. Son rides with him and states he does well driving.  - memantine (NAMENDA XR) 28 MG CP24 24 hr capsule; Take 1 capsule (28 mg total) by mouth daily.  Dispense: 90 capsule; Refill: 1 - donepezil (ARICEPT) 10 MG tablet; Take 1 tablet (10 mg total) by mouth at bedtime.  Dispense: 90 tablet; Refill: 1  4. Chronic gout without tophus, unspecified cause, unspecified site -without recent flares - Uric acid  Next appt: 4 weeks for blood pressure/lab follow up Jessica K. Biagio Borg  [facility] [phone]

## 2018-01-28 NOTE — Patient Instructions (Addendum)
[name] , Thank you for taking time to come for your Medicare Wellness Visit. I appreciate your ongoing commitment to your health goals. Please review the following plan we discussed and let me know if I can assist you in the future.   Screening recommendations/referrals: Colonoscopy due Recommended yearly ophthalmology/optometry visit for glaucoma screening and checkup Recommended yearly dental visit for hygiene and checkup Diabetic eye exam due  Vaccinations: Influenza vaccine up to date, due 2019 fall season Pneumococcal vaccine up to date Tdap vaccine up to date, due 04/13/2022 Shingles vaccine due, prescription sent to pharmacy  Advanced directives: in chart  Conditions/risks identified: none  Next appointment: Tyron RussellSara Adriannah Steinkamp, RN 01/31/2019 @ 2:30pm  Preventive Care 65 Years and Older, Male Preventive care refers to lifestyle choices and visits with your health care provider that can promote health and wellness. What does preventive care include?  A yearly physical exam. This is also called an annual well check.  Dental exams once or twice a year.  Routine eye exams. Ask your health care provider how often you should have your eyes checked.  Personal lifestyle choices, including:  Daily care of your teeth and gums.  Regular physical activity.  Eating a healthy diet.  Avoiding tobacco and drug use.  Limiting alcohol use.  Practicing safe sex.  Taking low doses of aspirin every day.  Taking vitamin and mineral supplements as recommended by your health care provider. What happens during an annual well check? The services and screenings done by your health care provider during your annual well check will depend on your age, overall health, lifestyle risk factors, and family history of disease. Counseling  Your health care provider may ask you questions about your:  Alcohol use.  Tobacco use.  Drug use.  Emotional well-being.  Home and relationship  well-being.  Sexual activity.  Eating habits.  History of falls.  Memory and ability to understand (cognition).  Work and work Astronomerenvironment. Screening  You may have the following tests or measurements:  Height, weight, and BMI.  Blood pressure.  Lipid and cholesterol levels. These may be checked every 5 years, or more frequently if you are over [id number] years old.  Skin check.  Lung cancer screening. You may have this screening every year starting at age 75 if you have a 30-pack-year history of smoking and currently smoke or have quit within the past 15 years.  Fecal occult blood test (FOBT) of the stool. You may have this test every year starting at age 75.  Flexible sigmoidoscopy or colonoscopy. You may have a sigmoidoscopy every 5 years or a colonoscopy every 10 years starting at age 75.  Prostate cancer screening. Recommendations will vary depending on your family history and other risks.  Hepatitis C blood test.  Hepatitis B blood test.  Sexually transmitted disease (STD) testing.  Diabetes screening. This is done by checking your blood sugar (glucose) after you have not eaten for a while (fasting). You may have this done every 1-3 years.  Abdominal aortic aneurysm (AAA) screening. You may need this if you are a current or former smoker.  Osteoporosis. You may be screened starting at age 75 if you are at high risk. Talk with your health care provider about your test results, treatment options, and if necessary, the need for more tests. Vaccines  Your health care provider may recommend certain vaccines, such as:  Influenza vaccine. This is recommended every year.  Tetanus, diphtheria, and acellular pertussis (Tdap, Td) vaccine. You may need  a Td booster every 10 years.  Zoster vaccine. You may need this after age 34.  Pneumococcal 13-valent conjugate (PCV13) vaccine. One dose is recommended after age 27.  Pneumococcal polysaccharide (PPSV23) vaccine. One dose is  recommended after age 56. Talk to your health care provider about which screenings and vaccines you need and how often you need them. This information is not intended to replace advice given to you by your health care provider. Make sure you discuss any questions you have with your health care provider. Document Released: 12/03/2015 Document Revised: 07/26/2016 Document Reviewed: 09/07/2015 Elsevier Interactive Patient Education  2017 [place] Prevention in the Home Falls can cause injuries. They can happen to people of all ages. There are many things you can do to make your home safe and to help prevent falls. What can I do on the outside of my home?  Regularly fix the edges of walkways and driveways and fix any cracks.  Remove anything that might make you trip as you walk through a door, such as a raised step or threshold.  Trim any bushes or trees on the path to your home.  Use bright outdoor lighting.  Clear any walking paths of anything that might make someone trip, such as rocks or tools.  Regularly check to see if handrails are loose or broken. Make sure that both sides of any steps have handrails.  Any raised decks and porches should have guardrails on the edges.  Have any leaves, snow, or ice cleared regularly.  Use sand or salt on walking paths during winter.  Clean up any spills in your garage right away. This includes oil or grease spills. What can I do in the bathroom?  Use night lights.  Install grab bars by the toilet and in the tub and shower. Do not use towel bars as grab bars.  Use non-skid mats or decals in the tub or shower.  If you need to sit down in the shower, use a plastic, non-slip stool.  Keep the floor dry. Clean up any water that spills on the floor as soon as it happens.  Remove soap buildup in the tub or shower regularly.  Attach bath mats securely with double-sided non-slip rug tape.  Do not have throw rugs and other things on  the floor that can make you trip. What can I do in the bedroom?  Use night lights.  Make sure that you have a light by your bed that is easy to reach.  Do not use any sheets or blankets that are too big for your bed. They should not hang down onto the floor.  Have a firm chair that has side arms. You can use this for support while you get dressed.  Do not have throw rugs and other things on the floor that can make you trip. What can I do in the kitchen?  Clean up any spills right away.  Avoid walking on wet floors.  Keep items that you use a lot in easy-to-reach places.  If you need to reach something above you, use a strong step stool that has a grab bar.  Keep electrical cords out of the way.  Do not use floor polish or wax that makes floors slippery. If you must use wax, use non-skid floor wax.  Do not have throw rugs and other things on the floor that can make you trip. What can I do with my stairs?  Do not leave any items on the  stairs.  Make sure that there are handrails on both sides of the stairs and use them. Fix handrails that are broken or loose. Make sure that handrails are as long as the stairways.  Check any carpeting to make sure that it is firmly attached to the stairs. Fix any carpet that is loose or worn.  Avoid having throw rugs at the top or bottom of the stairs. If you do have throw rugs, attach them to the floor with carpet tape.  Make sure that you have a light switch at the top of the stairs and the bottom of the stairs. If you do not have them, ask someone to add them for you. What else can I do to help prevent falls?  Wear shoes that:  Do not have high heels.  Have rubber bottoms.  Are comfortable and fit you well.  Are closed at the toe. Do not wear sandals.  If you use a stepladder:  Make sure that it is fully opened. Do not climb a closed stepladder.  Make sure that both sides of the stepladder are locked into place.  Ask someone to  hold it for you, if possible.  Clearly mark and make sure that you can see:  Any grab bars or handrails.  First and last steps.  Where the edge of each step is.  Use tools that help you move around (mobility aids) if they are needed. These include:  Canes.  Walkers.  Scooters.  Crutches.  Turn on the lights when you go into a dark area. Replace any light bulbs as soon as they burn out.  Set up your furniture so you have a clear path. Avoid moving your furniture around.  If any of your floors are uneven, fix them.  If there are any pets around you, be aware of where they are.  Review your medicines with your doctor. Some medicines can make you feel dizzy. This can increase your chance of falling. Ask your doctor what other things that you can do to help prevent falls. This information is not intended to replace advice given to you by your health care provider. Make sure you discuss any questions you have with your health care provider. Document Released: 09/02/2009 Document Revised: 04/13/2016 Document Reviewed: 12/11/2014 Elsevier Interactive Patient Education  2017 [employer].

## 2018-01-28 NOTE — Progress Notes (Signed)
Subjective:   Gary Mcneil is a 77 y.o. male who presents for Medicare Annual/Subsequent preventive examination.  Last AWV-12/26/2016       Objective:    Vitals: BP (!) 152/80 (BP Location: Left Arm, Patient Position: Sitting)   Pulse 95   Temp 98.4 F (36.9 C) (Oral)   Ht 6\' 1"  (1.854 m)   Wt 191 lb (86.6 kg)   SpO2 96%   BMI 25.20 kg/m   Body mass index is 25.2 kg/m.  Advanced Directives 01/28/2018 09/10/2017 12/26/2016 05/05/2016 02/02/2016 01/04/2016 04/24/2015  Does Patient Have a Medical Advance Directive? Yes Yes Yes Yes Yes Yes No  Type of Social research officer, government Power of [employer] Power of Attorney Healthcare Power of Attorney Living will -  Does patient want to make changes to medical advance directive? No - Patient declined - - No - Patient declined No - Patient declined No - Patient declined -  Copy of Healthcare Power of Attorney in Chart? Yes Yes Yes Yes Yes No - copy requested -  Would patient like information on creating a medical advance directive? - - - - - - No - patient declined information  Pre-existing out of facility DNR order (yellow form or pink MOST form) - - - - - - -    Tobacco Social History   Tobacco Use  Smoking Status Former Smoker  . Last attempt to quit: 11/20/1969  . Years since quitting: 48.2  Smokeless Tobacco Never Used     Counseling given: Not Answered   Clinical Intake:  Pre-visit preparation completed: No  Pain : No/denies pain     Nutritional Risks: None Diabetes: No  How often do you need to have someone help you when you read instructions, pamphlets, or other written materials from your doctor or pharmacy?: 1 - Never What is the last grade level you completed in school?: PHD  Interpreter Needed?: No  Information entered by :: [name], RN  Past Medical History:  Diagnosis Date  . "walking corpse" syndrome   . Acute kidney failure,  unspecified (HCC)   . Acute kidney failure, unspecified (HCC)   . Anemia, unspecified   . Arthritis   . Dementia   . Dementia 04/24/2012  . Disorder of bone and cartilage, unspecified   . Dizziness and giddiness   . GERD (gastroesophageal reflux disease)   . Gout   . Gout, unspecified   . Hypertension   . Hypopotassemia   . Hypotension, unspecified   . Hypotension, unspecified   . Memory loss   . Memory loss   . Muscle weakness (generalized)   . Myalgia and myositis, unspecified   . Neuromuscular disorder (HCC)   . Other and unspecified hyperlipidemia   . Screening for ischemic heart disease   . Tobacco use disorder   . Type II or unspecified type diabetes mellitus without mention of complication, uncontrolled   . Type II or unspecified type diabetes mellitus without mention of complication, uncontrolled   . Unspecified hereditary and idiopathic peripheral neuropathy   . Unspecified vitamin D deficiency    Past Surgical History:  Procedure Laterality Date  . INGUINAL HERNIA REPAIR    . VIDEO ASSISTED THORACOSCOPY (VATS)/EMPYEMA Right 04/30/2015   Procedure: VIDEO ASSISTED THORACOSCOPY (VATS)/DRAIN EMPYEMA;  Surgeon: [name], MD;  Location: [facility] OR;  Service: Thoracic;  Laterality: Right;  [name] VIDEO BRONCHOSCOPY N/A 04/30/2015   Procedure: VIDEO BRONCHOSCOPY;  Surgeon: Kathlee Nations  Maudie Flakes, MD;  Location: [facility] OR;  Service: Thoracic;  Laterality: N/A;   Family History  Problem Relation Age of Onset  . Diabetes Mother   . Hypertension Mother   . Diabetes Father   . Hypertension Father   . Kidney disease Father   . Diabetes Sister   . Kidney disease Brother    Social History   Socioeconomic History  . Marital status: Single    Spouse name: None  . Number of children: 3  . Years of education: None  . Highest education level: None  Social Needs  . Financial resource strain: Not hard at all  . Food insecurity - worry: Never true  . Food insecurity - inability: Never true    . Transportation needs - medical: No  . Transportation needs - non-medical: No  Occupational History  . Occupation: Retired Charity fundraiser from [employer]  . Occupation: Retired Education officer, environmental  Tobacco Use  . Smoking status: Former Smoker    Last attempt to quit: 11/20/1969    Years since quitting: 48.2  . Smokeless tobacco: Never Used  Substance and Sexual Activity  . Alcohol use: No  . Drug use: No  . Sexual activity: No  Other Topics Concern  . None  Social History Narrative   Bachelors in Nurse, adult in [place]   Attended [employer] and [employer]             Outpatient Encounter Medications as of 01/28/2018  Medication Sig  . allopurinol (ZYLOPRIM) 100 MG tablet Take 2 tablets (200 mg total) by mouth daily.  [name] atorvastatin (LIPITOR) 20 MG tablet TAKE 1 TABLET BY MOUTH DAILY AT 6 PM  . losartan (COZAAR) 50 MG tablet Take 1 tablet (50 mg total) by mouth daily.  . Multiple Vitamin (MULTIVITAMIN WITH MINERALS) TABS tablet Take 1 tablet by mouth daily.  [name] NAMZARIC 28-10 MG CP24 TAKE 1 CAPSULE BY MOUTH DAILY  . potassium chloride SA (K-DUR,KLOR-CON) 20 MEQ tablet TAKE 2 TABLETS(40 MEQ) BY MOUTH TWICE DAILY  . TRADJENTA 5 MG TABS tablet TAKE 1 TABLET(5 MG) BY MOUTH DAILY  . Zoster Vaccine Adjuvanted [facility]) injection Inject 0.5 mLs into the muscle once for 1 dose.  . [DISCONTINUED] Zoster Vaccine Adjuvanted [facility]) injection Inject 0.5 mLs into the muscle once.   No facility-administered encounter medications on file as of 01/28/2018.     Activities of Daily Living In your present state of health, do you have any difficulty performing the following activities: 01/28/2018  Hearing? N  Vision? N  Difficulty concentrating or making decisions? Y  Walking or climbing stairs? N  Dressing or bathing? N  Doing errands, shopping? N  Preparing Food and eating ? N  Using the Toilet? N  In the past six months, have you accidently leaked urine? N  Do you have problems with loss of  bowel control? N  Managing your Medications? N  Managing your Finances? N  Housekeeping or managing your Housekeeping? N  Some recent data might be hidden    Patient Care Team: [name], NP as PCP - General (Nurse Practitioner)   Assessment:   This is a routine wellness examination for Gary Mcneil.  Exercise Activities and Dietary recommendations Current Exercise Habits: The patient does not participate in regular exercise at present, Exercise limited by: None identified  Goals    . DIET - INCREASE WATER INTAKE     Increase to 5 glasses of water a day.        Fall  Risk Fall Risk  01/28/2018 09/10/2017 01/04/2017 12/26/2016 05/05/2016  Falls in the past year? No No Yes No No  Number falls in past yr: - - 1 - -  Comment - - Fell on ice, no injury  - -  Injury with Fall? - - No - -  Risk for fall due to : - - - History of fall(s) -   Is the patient's home free of loose throw rugs in walkways, pet beds, electrical cords, etc?   yes      Grab bars in the bathroom? yes      Handrails on the stairs?   yes      Adequate lighting?   yes  Depression Screen PHQ 2/9 Scores 01/28/2018 12/26/2016 05/05/2016 11/02/2014  PHQ - 2 Score 0 0 0 0    Cognitive Function MMSE - Mini Mental State Exam 01/28/2018 12/26/2016 05/05/2016 02/25/2015 05/20/2014  Orientation to time 1 2 2 1 2   Orientation to Place 3 4 5 3 5   Registration 3 3 3 3 3   Attention/ Calculation 5 5 5 5 5   Recall 0 2 3 0 1  Language- name 2 objects 2 2 2 2 2   Language- repeat 1 1 1 1 1   Language- follow 3 step command 3 3 3 3 3   Language- read & follow direction 1 1 1 1 1   Write a sentence 1 1 1 1 1   Copy design 1 1 1  0 1  Copy design-comments - - - Unable to see clearly  -  Total score 21 25 27 20 25         Immunization History  Administered Date(s) Administered  . Influenza Whole 09/20/2013  . Influenza, High Dose Seasonal PF 09/10/2017  . Influenza,inj,Quad PF,6+ Mos 09/02/2014, 01/04/2016  . Pneumococcal  Conjugate-13 12/26/2016  . Pneumococcal Polysaccharide-23 04/27/2015  . Tdap 04/13/2012    Qualifies for Shingles Vaccine? Yes, educated and prescription sent to pharmacy  Screening Tests Health Maintenance  Topic Date Due  . COLONOSCOPY  04/13/2010  . OPHTHALMOLOGY EXAM  12/29/2016  . HEMOGLOBIN A1C  03/11/2018  . FOOT EXAM  09/10/2018  . TETANUS/TDAP  04/13/2022  . INFLUENZA VACCINE  Completed  . PNA vac Low Risk Adult  Completed   Cancer Screenings: Lung: Low Dose CT Chest recommended if Age 82-80 years, 30 pack-year currently smoking OR have quit w/in 15years. Patient does not qualify. Colorectal: due, ordered  Additional Screenings: Hepatitis C Screening: declined Patient stated he will make diabetic eye exam    Plan:    I have personally reviewed and addressed the Medicare Annual Wellness questionnaire and have noted the following in the patient's chart:  A. Medical and social history B. Use of alcohol, tobacco or illicit drugs  C. Current medications and supplements D. Functional ability and status E.  Nutritional status F.  Physical activity G. Advance directives H. List of other physicians I.  Hospitalizations, surgeries, and ER visits in previous 12 months J.  Vitals K. Screenings to include hearing, vision, cognitive, depression L. Referrals and appointments - none  In addition, I have reviewed and discussed with patient certain preventive protocols, quality metrics, and best practice recommendations. A written personalized care plan for preventive services as well as general preventive health recommendations were provided to patient.  See attached scanned questionnaire for additional information.   Signed,   Tyron RussellSara , RN Nurse Health Advisor  Patient Concerns:None

## 2018-01-29 ENCOUNTER — Telehealth: Payer: Self-pay

## 2018-01-29 ENCOUNTER — Other Ambulatory Visit: Payer: Self-pay | Admitting: Nurse Practitioner

## 2018-01-29 DIAGNOSIS — M109 Gout, unspecified: Secondary | ICD-10-CM

## 2018-01-29 LAB — CBC WITH DIFFERENTIAL/PLATELET
BASOS ABS: 33 {cells}/uL (ref 0–200)
Basophils Relative: 0.5 %
EOS PCT: 2 %
Eosinophils Absolute: 130 cells/uL (ref 15–500)
HEMATOCRIT: 42.9 % (ref 38.5–50.0)
HEMOGLOBIN: 14.8 g/dL (ref 13.2–17.1)
LYMPHS ABS: 1879 {cells}/uL (ref 850–3900)
MCH: 29.7 pg (ref 27.0–33.0)
MCHC: 34.5 g/dL (ref 32.0–36.0)
MCV: 86.1 fL (ref 80.0–100.0)
MPV: 9.5 fL (ref 7.5–12.5)
Monocytes Relative: 9.1 %
NEUTROS ABS: 3868 {cells}/uL (ref 1500–7800)
Neutrophils Relative %: 59.5 %
Platelets: 163 10*3/uL (ref 140–400)
RBC: 4.98 10*6/uL (ref 4.20–5.80)
RDW: 15 % (ref 11.0–15.0)
Total Lymphocyte: 28.9 %
WBC mixed population: 592 cells/uL (ref 200–950)
WBC: 6.5 10*3/uL (ref 3.8–10.8)

## 2018-01-29 LAB — COMPLETE METABOLIC PANEL WITH GFR
AG Ratio: 1.8 (calc) (ref 1.0–2.5)
ALT: 22 U/L (ref 9–46)
AST: 23 U/L (ref 10–35)
Albumin: 4.4 g/dL (ref 3.6–5.1)
Alkaline phosphatase (APISO): 67 U/L (ref 40–115)
BILIRUBIN TOTAL: 1.1 mg/dL (ref 0.2–1.2)
BUN: 15 mg/dL (ref 7–25)
CHLORIDE: 107 mmol/L (ref 98–110)
CO2: 24 mmol/L (ref 20–32)
Calcium: 9.4 mg/dL (ref 8.6–10.3)
Creat: 1.14 mg/dL (ref 0.70–1.18)
GFR, EST AFRICAN AMERICAN: 73 mL/min/{1.73_m2} (ref >=60)
GFR, Est Non African American: 63 mL/min/{1.73_m2} (ref >=60)
GLUCOSE: 125 mg/dL (ref 65–139)
Globulin: 2.4 g/dL (calc) (ref 1.9–3.7)
Potassium: 3.8 mmol/L (ref 3.5–5.3)
Sodium: 140 mmol/L (ref 135–146)
TOTAL PROTEIN: 6.8 g/dL (ref 6.1–8.1)

## 2018-01-29 LAB — HEMOGLOBIN A1C
Hgb A1c MFr Bld: 7.7 % of total Hgb — ABNORMAL HIGH (ref <5.7)
MEAN PLASMA GLUCOSE: 174 (calc)
eAG (mmol/L): 9.7 (calc)

## 2018-01-29 LAB — URIC ACID: Uric Acid, Serum: 8.9 mg/dL — ABNORMAL HIGH (ref 4.0–8.0)

## 2018-01-29 MED ORDER — ALLOPURINOL 300 MG PO TABS: 300.0000 mg | ORAL_TABLET | Freq: Every day | ORAL | 1 refills | Status: DC

## 2018-01-29 MED ORDER — METFORMIN HCL 500 MG PO TABS: 500.0000 mg | ORAL_TABLET | Freq: Every day | ORAL | 1 refills | Status: DC

## 2018-01-29 NOTE — Telephone Encounter (Signed)
-----   Message from Sharon SellerJessica K Eubanks, NP sent at 01/29/2018  9:57 AM EDT ----- Electrolytes, kidney, liver and blood count normal A1c (average blood sugar over  3 months) is worse at 7.8- lets add metformin 500 mg daily with breakfast to help. To continue tradjenta also for diabetes. Also uric acid level is high, to increase allopurinol to 300 mg daily- new Rx sent to pharmacy but can take 3 of the 100 mg tablets until he runs out.

## 2018-01-29 NOTE — Telephone Encounter (Signed)
Medication list is up to date with provider recommendations 

## 2018-02-18 ENCOUNTER — Telehealth: Payer: Self-pay | Admitting: *Deleted

## 2018-02-18 DIAGNOSIS — F0150 Vascular dementia without behavioral disturbance: Secondary | ICD-10-CM

## 2018-02-18 NOTE — Telephone Encounter (Signed)
Patient son, Rocky LinkKen called and stated that Namzaric was changed to 2 separate medication due to costs but come to find out it is cheaper to do the Namzaric. Wants the medications changed back to the Namzaric due to cheaper. Please Advise.

## 2018-02-18 NOTE — Telephone Encounter (Signed)
Okay to stop and restart previous namzaric

## 2018-02-19 MED ORDER — MEMANTINE HCL-DONEPEZIL HCL ER 28-10 MG PO CP24: 1.0000 | ORAL_CAPSULE | Freq: Every day | ORAL | 3 refills | Status: DC

## 2018-02-19 NOTE — Telephone Encounter (Signed)
Patient son notified and agreed. Medication list updated.  

## 2018-03-17 ENCOUNTER — Other Ambulatory Visit: Payer: Self-pay | Admitting: Nurse Practitioner

## 2018-03-17 DIAGNOSIS — E1140 Type 2 diabetes mellitus with diabetic neuropathy, unspecified: Secondary | ICD-10-CM

## 2018-05-08 ENCOUNTER — Other Ambulatory Visit: Payer: Self-pay | Admitting: Nurse Practitioner

## 2018-05-08 DIAGNOSIS — I10 Essential (primary) hypertension: Secondary | ICD-10-CM

## 2018-07-18 ENCOUNTER — Other Ambulatory Visit: Payer: Self-pay | Admitting: Nurse Practitioner

## 2018-08-06 ENCOUNTER — Other Ambulatory Visit: Payer: Self-pay | Admitting: Nurse Practitioner

## 2018-09-11 ENCOUNTER — Other Ambulatory Visit: Payer: Self-pay | Admitting: Nurse Practitioner

## 2018-09-11 DIAGNOSIS — E1140 Type 2 diabetes mellitus with diabetic neuropathy, unspecified: Secondary | ICD-10-CM

## 2018-09-11 NOTE — Telephone Encounter (Signed)
A medication refill was received from pharmacy for tradjenta 5 mg and metformin 500 mg. Request was pended to provider for approval due to patient having not been seen since March 2019 and there is no pending appointment scheduled. Pt was notified with last refill that appointment was needed.

## 2018-12-04 ENCOUNTER — Telehealth: Payer: Self-pay

## 2018-12-04 ENCOUNTER — Other Ambulatory Visit: Payer: Self-pay | Admitting: Nurse Practitioner

## 2018-12-04 DIAGNOSIS — E1140 Type 2 diabetes mellitus with diabetic neuropathy, unspecified: Secondary | ICD-10-CM

## 2018-12-04 DIAGNOSIS — I10 Essential (primary) hypertension: Secondary | ICD-10-CM

## 2018-12-04 NOTE — Telephone Encounter (Signed)
Called and left msg to return call. Patient need to be notified Patient must be seen before additional refills.

## 2018-12-04 NOTE — Telephone Encounter (Signed)
Note to pharmacy: "Patient must be seen before additional refills."

## 2018-12-13 ENCOUNTER — Other Ambulatory Visit: Payer: Self-pay | Admitting: Nurse Practitioner

## 2018-12-13 DIAGNOSIS — M109 Gout, unspecified: Secondary | ICD-10-CM

## 2018-12-27 ENCOUNTER — Other Ambulatory Visit: Payer: Self-pay | Admitting: Nurse Practitioner

## 2018-12-27 DIAGNOSIS — I10 Essential (primary) hypertension: Secondary | ICD-10-CM

## 2019-01-31 ENCOUNTER — Encounter: Payer: Self-pay | Admitting: Family

## 2019-01-31 ENCOUNTER — Ambulatory Visit (INDEPENDENT_AMBULATORY_CARE_PROVIDER_SITE_OTHER): Payer: Medicare Other | Admitting: Family

## 2019-01-31 ENCOUNTER — Other Ambulatory Visit: Payer: Self-pay

## 2019-01-31 ENCOUNTER — Ambulatory Visit: Payer: Self-pay

## 2019-01-31 VITALS — BP 138/74 | HR 95 | Temp 97.7°F | Ht 73.0 in | Wt 183.6 lb

## 2019-01-31 DIAGNOSIS — Z0000 Encounter for general adult medical examination without abnormal findings: Secondary | ICD-10-CM | POA: Diagnosis not present

## 2019-01-31 NOTE — Patient Instructions (Signed)
Gary Mcneil , Thank you for taking time to come for your Medicare Wellness Visit. I appreciate your ongoing commitment to your health goals. Please review the following plan we discussed and let me know if I can assist you in the future.   Screening recommendations/referrals: Colonoscopy: Up to date  Recommended yearly ophthalmology/optometry visit for glaucoma screening and checkup Recommended yearly dental visit for hygiene and checkup  Vaccinations: Influenza vaccine : Up to date  Pneumococcal vaccine: Up to date  Tdap vaccine: U[p to date due 2023 Shingles vaccine: Declined   Advanced directives:Copy in chart   Conditions/risks identified: advance age > 55 years ,Type 2 DM,Hyperlipidemia,Hypertension,male Gender,sedentary lifestyle   Next appointment: 1 year   Preventive Care 76 Years and Older, Male Preventive care refers to lifestyle choices and visits with your health care provider that can promote health and wellness. What does preventive care include?  A yearly physical exam. This is also called an annual well check.  Dental exams once or twice a year.  Routine eye exams. Ask your health care provider how often you should have your eyes checked.  Personal lifestyle choices, including:  Daily care of your teeth and gums.  Regular physical activity.  Eating a healthy diet.  Avoiding tobacco and drug use.  Limiting alcohol use.  Practicing safe sex.  Taking low doses of aspirin every day.  Taking vitamin and mineral supplements as recommended by your health care provider. What happens during an annual well check? The services and screenings done by your health care provider during your annual well check will depend on your age, overall health, lifestyle risk factors, and family history of disease. Counseling  Your health care provider may ask you questions about your:  Alcohol use.  Tobacco use.  Drug use.  Emotional well-being.  Home and relationship  well-being.  Sexual activity.  Eating habits.  History of falls.  Memory and ability to understand (cognition).  Work and work Astronomer. Screening  You may have the following tests or measurements:  Height, weight, and BMI.  Blood pressure.  Lipid and cholesterol levels. These may be checked every 5 years, or more frequently if you are over 10 years old.  Skin check.  Lung cancer screening. You may have this screening every year starting at age 76 if you have a 30-pack-year history of smoking and currently smoke or have quit within the past 15 years.  Fecal occult blood test (FOBT) of the stool. You may have this test every year starting at age 76.  Flexible sigmoidoscopy or colonoscopy. You may have a sigmoidoscopy every 5 years or a colonoscopy every 10 years starting at age 76.  Prostate cancer screening. Recommendations will vary depending on your family history and other risks.  Hepatitis C blood test.  Hepatitis B blood test.  Sexually transmitted disease (STD) testing.  Diabetes screening. This is done by checking your blood sugar (glucose) after you have not eaten for a while (fasting). You may have this done every 1-3 years.  Abdominal aortic aneurysm (AAA) screening. You may need this if you are a current or former smoker.  Osteoporosis. You may be screened starting at age 76 if you are at high risk. Talk with your health care provider about your test results, treatment options, and if necessary, the need for more tests. Vaccines  Your health care provider may recommend certain vaccines, such as:  Influenza vaccine. This is recommended every year.  Tetanus, diphtheria, and acellular pertussis (Tdap, Td) vaccine.  You may need a Td booster every 10 years.  Zoster vaccine. You may need this after age 85.  Pneumococcal 13-valent conjugate (PCV13) vaccine. One dose is recommended after age 76.  Pneumococcal polysaccharide (PPSV23) vaccine. One dose is  recommended after age 76. Talk to your health care provider about which screenings and vaccines you need and how often you need them. This information is not intended to replace advice given to you by your health care provider. Make sure you discuss any questions you have with your health care provider. Document Released: 12/03/2015 Document Revised: 07/26/2016 Document Reviewed: 09/07/2015 Elsevier Interactive Patient Education  2017 [place] Prevention in the Home Falls can cause injuries. They can happen to people of all ages. There are many things you can do to make your home safe and to help prevent falls. What can I do on the outside of my home?  Regularly fix the edges of walkways and driveways and fix any cracks.  Remove anything that might make you trip as you walk through a door, such as a raised step or threshold.  Trim any bushes or trees on the path to your home.  Use bright outdoor lighting.  Clear any walking paths of anything that might make someone trip, such as rocks or tools.  Regularly check to see if handrails are loose or broken. Make sure that both sides of any steps have handrails.  Any raised decks and porches should have guardrails on the edges.  Have any leaves, snow, or ice cleared regularly.  Use sand or salt on walking paths during winter.  Clean up any spills in your garage right away. This includes oil or grease spills. What can I do in the bathroom?  Use night lights.  Install grab bars by the toilet and in the tub and shower. Do not use towel bars as grab bars.  Use non-skid mats or decals in the tub or shower.  If you need to sit down in the shower, use a plastic, non-slip stool.  Keep the floor dry. Clean up any water that spills on the floor as soon as it happens.  Remove soap buildup in the tub or shower regularly.  Attach bath mats securely with double-sided non-slip rug tape.  Do not have throw rugs and other things on  the floor that can make you trip. What can I do in the bedroom?  Use night lights.  Make sure that you have a light by your bed that is easy to reach.  Do not use any sheets or blankets that are too big for your bed. They should not hang down onto the floor.  Have a firm chair that has side arms. You can use this for support while you get dressed.  Do not have throw rugs and other things on the floor that can make you trip. What can I do in the kitchen?  Clean up any spills right away.  Avoid walking on wet floors.  Keep items that you use a lot in easy-to-reach places.  If you need to reach something above you, use a strong step stool that has a grab bar.  Keep electrical cords out of the way.  Do not use floor polish or wax that makes floors slippery. If you must use wax, use non-skid floor wax.  Do not have throw rugs and other things on the floor that can make you trip. What can I do with my stairs?  Do not leave any  items on the stairs.  Make sure that there are handrails on both sides of the stairs and use them. Fix handrails that are broken or loose. Make sure that handrails are as long as the stairways.  Check any carpeting to make sure that it is firmly attached to the stairs. Fix any carpet that is loose or worn.  Avoid having throw rugs at the top or bottom of the stairs. If you do have throw rugs, attach them to the floor with carpet tape.  Make sure that you have a light switch at the top of the stairs and the bottom of the stairs. If you do not have them, ask someone to add them for you. What else can I do to help prevent falls?  Wear shoes that:  Do not have high heels.  Have rubber bottoms.  Are comfortable and fit you well.  Are closed at the toe. Do not wear sandals.  If you use a stepladder:  Make sure that it is fully opened. Do not climb a closed stepladder.  Make sure that both sides of the stepladder are locked into place.  Ask someone to  hold it for you, if possible.  Clearly mark and make sure that you can see:  Any grab bars or handrails.  First and last steps.  Where the edge of each step is.  Use tools that help you move around (mobility aids) if they are needed. These include:  Canes.  Walkers.  Scooters.  Crutches.  Turn on the lights when you go into a dark area. Replace any light bulbs as soon as they burn out.  Set up your furniture so you have a clear path. Avoid moving your furniture around.  If any of your floors are uneven, fix them.  If there are any pets around you, be aware of where they are.  Review your medicines with your doctor. Some medicines can make you feel dizzy. This can increase your chance of falling. Ask your doctor what other things that you can do to help prevent falls. This information is not intended to replace advice given to you by your health care provider. Make sure you discuss any questions you have with your health care provider. Document Released: 09/02/2009 Document Revised: 04/13/2016 Document Reviewed: 12/11/2014 Elsevier Interactive Patient Education  2017 [employer].

## 2019-01-31 NOTE — Progress Notes (Signed)
Subjective:   Gary Mcneil is a 78 y.o. male who presents for Medicare Annual/Subsequent preventive examination.  Review of Systems:  Cardiac Risk Factors include: diabetes mellitus;dyslipidemia;male gender;hypertension;sedentary lifestyle;advanced age (>80men, >22 women)     Objective:    Vitals: BP 138/74   Pulse 95   Temp 97.7 F (36.5 C) (Oral)   Ht  (1.854 m)   Wt 183 lb 9.6 oz (83.3 kg)   SpO2 98%   BMI 24.22 kg/m   Body mass index is 24.22 kg/m.  Advanced Directives 01/28/2018 09/10/2017 12/26/2016 05/05/2016 02/02/2016 01/04/2016 04/24/2015  Does Patient Have a Medical Advance Directive? Yes Yes Yes Yes Yes Yes No  Type of Social research officer, government Power of [employer] Power of Attorney Healthcare Power of Attorney Living will -  Does patient want to make changes to medical advance directive? No - Patient declined - - No - Patient declined No - Patient declined No - Patient declined -  Copy of Healthcare Power of Attorney in Chart? Yes Yes Yes Yes Yes No - copy requested -  Would patient like information on creating a medical advance directive? - - - - - - No - patient declined information  Pre-existing out of facility DNR order (yellow form or pink MOST form) - - - - - - -    Tobacco Social History   Tobacco Use  Smoking Status Former Smoker  . Last attempt to quit: 11/20/1969  . Years since quitting: 49.2  Smokeless Tobacco Never Used     Counseling given: Not Answered   Clinical Intake:  Pre-visit preparation completed: No  Pain : No/denies pain     BMI - recorded: 24.22 Nutritional Status: BMI of 19-24  Normal Nutritional Risks: None Diabetes: Yes CBG done?: No Did pt. bring in CBG monitor from home?: No  How often do you need to have someone help you when you read instructions, pamphlets, or other written materials from your doctor or pharmacy?: 1 - Never What is the last grade  level you completed in school?: PHD theology,PYschology   Interpreter Needed?: No  Information entered by :: Dinah Ngetich FNP-C   Past Medical History:  Diagnosis Date  . "walking corpse" syndrome   . Acute kidney failure, unspecified (HCC)   . Acute kidney failure, unspecified (HCC)   . Anemia, unspecified   . Arthritis   . Dementia   . Dementia (HCC) 04/24/2012  . Disorder of bone and cartilage, unspecified   . Dizziness and giddiness   . GERD (gastroesophageal reflux disease)   . Gout   . Gout, unspecified   . Hypertension   . Hypopotassemia   . Hypotension, unspecified   . Hypotension, unspecified   . Memory loss   . Memory loss   . Muscle weakness (generalized)   . Myalgia and myositis, unspecified   . Neuromuscular disorder (HCC)   . Other and unspecified hyperlipidemia   . Screening for ischemic heart disease   . Tobacco use disorder   . Type II or unspecified type diabetes mellitus without mention of complication, uncontrolled   . Type II or unspecified type diabetes mellitus without mention of complication, uncontrolled   . Unspecified hereditary and idiopathic peripheral neuropathy   . Unspecified vitamin D deficiency    Past Surgical History:  Procedure Laterality Date  . INGUINAL HERNIA REPAIR    . VIDEO ASSISTED THORACOSCOPY (VATS)/EMPYEMA Right 04/30/2015   Procedure: VIDEO ASSISTED THORACOSCOPY (  VATS)/DRAIN EMPYEMA;  Surgeon: [name], MD;  Location: [facility] OR;  Service: Thoracic;  Laterality: Right;  [name] VIDEO BRONCHOSCOPY N/A 04/30/2015   Procedure: VIDEO BRONCHOSCOPY;  Surgeon: [name], MD;  Location: [facility] OR;  Service: Thoracic;  Laterality: N/A;   Family History  Problem Relation Age of Onset  . Diabetes Mother   . Hypertension Mother   . Diabetes Father   . Hypertension Father   . Kidney disease Father   . Diabetes Sister   . Kidney disease Brother    Social History   Socioeconomic History  . Marital status: Single    Spouse name:  Not on file  . Number of children: 3  . Years of education: Not on file  . Highest education level: Not on file  Occupational History  . Occupation: Retired Charity fundraiser from [employer]  . Occupation: Retired Facilities manager  . Financial resource strain: Not hard at all  . Food insecurity:    Worry: Never true    Inability: Never true  . Transportation needs:    Medical: No    Non-medical: No  Tobacco Use  . Smoking status: Former Smoker    Last attempt to quit: 11/20/1969    Years since quitting: 49.2  . Smokeless tobacco: Never Used  Substance and Sexual Activity  . Alcohol use: No  . Drug use: No  . Sexual activity: Never  Lifestyle  . Physical activity:    Days per week: 0 days    Minutes per session: 0 min  . Stress: Not at all  Relationships  . Social connections:    Talks on phone: More than three times a week    Gets together: More than three times a week    Attends religious service: Never    Active member of club or organization: No    Attends meetings of clubs or organizations: Never    Relationship status: Never married  Other Topics Concern  . Not on file  Social History Narrative   Bachelors in Nurse, adult in [place]   Attended [employer] and [employer]             Outpatient Encounter Medications as of 01/31/2019  Medication Sig  . allopurinol (ZYLOPRIM) 300 MG tablet TAKE 1 TABLET(300 MG) BY MOUTH DAILY  . atorvastatin (LIPITOR) 20 MG tablet TAKE 1 TABLET BY MOUTH DAILY AT 6 PM  . losartan (COZAAR) 100 MG tablet TAKE 1 TABLET(100 MG) BY MOUTH DAILY  . metFORMIN (GLUCOPHAGE) 500 MG tablet TAKE 1 TABLET BY MOUTH EVERY DAY WITH BREAKFAST  . Multiple Vitamin (MULTIVITAMIN WITH MINERALS) TABS tablet Take 1 tablet by mouth daily.  [name] NAMZARIC 28-10 MG CP24 TAKE ONE CAPSULE BY MOUTH DAILY  . potassium chloride SA (K-DUR,KLOR-CON) 20 MEQ tablet TAKE 2 TABLETS(40 MEQ) BY MOUTH TWICE DAILY WITH FOOD  . TRADJENTA 5 MG TABS tablet TAKE 1 TABLET BY  MOUTH EVERY DAY   No facility-administered encounter medications on file as of 01/31/2019.     Activities of Daily Living In your present state of health, do you have any difficulty performing the following activities: 01/31/2019  Hearing? N  Vision? N  Difficulty concentrating or making decisions? Y  Comment forgetful and making decision   Walking or climbing stairs? N  Dressing or bathing? N  Doing errands, shopping? N  Preparing Food and eating ? Y  Using the Toilet? N  In the past six months, have you accidently leaked urine?  N  Do you have problems with loss of bowel control? N  Managing your Medications? Y  Comment son manages medication forgets to take at times   Managing your Finances? Y  Housekeeping or managing your Housekeeping? Y  Some recent data might be hidden    Patient Care Team: Sharon SellerEubanks, Jessica K, NP as PCP - General (Nurse Practitioner)   Assessment:   This is a routine wellness examination for Gary Mcneil.  Exercise Activities and Dietary recommendations Current Exercise Habits: The patient does not participate in regular exercise at present, Exercise limited by: None identified  Goals    . DIET - INCREASE WATER INTAKE     Increase to 5 glasses of water a day.     . Increase water intake     Starting 12/26/16, I will attempt to increase my water intake to 48 oz. Per day.        Fall Risk Fall Risk  01/31/2019 01/28/2018 09/10/2017 01/04/2017 12/26/2016  Falls in the past year? 0 No No Yes No  Number falls in past yr: 0 - - 1 -  Comment - - - Fell on ice, no injury  -  Injury with Fall? 0 - - No -  Risk for fall due to : - - - - History of fall(s)   Is the patient's home free of loose throw rugs in walkways, pet beds, electrical cords, etc?   yes      Grab bars in the bathroom? yes      Handrails on the stairs?   N/A      Adequate lighting?   yes  Depression Screen PHQ 2/9 Scores 01/31/2019 01/28/2018 12/26/2016 05/05/2016  PHQ - 2 Score 0 0 0 0     Cognitive Function MMSE - Mini Mental State Exam 01/31/2019 01/28/2018 12/26/2016 05/05/2016 02/25/2015  Orientation to time 2 1 2 2 1   Orientation to Place 4 3 4 5 3   Registration 3 3 3 3 3   Attention/ Calculation 5 5 5 5 5   Recall 3 0 2 3 0  Language- name 2 objects 2 2 2 2 2   Language- repeat 1 1 1 1 1   Language- follow 3 step command 3 3 3 3 3   Language- read & follow direction 1 1 1 1 1   Write a sentence 1 1 1 1 1   Copy design 1 1 1 1  0  Copy design-comments - - - - Unable to see clearly   Total score 26 21 25 27 20     Immunization History  Administered Date(s) Administered  . Influenza Whole 09/20/2013  . Influenza, High Dose Seasonal PF 09/10/2017  . Influenza,inj,Quad PF,6+ Mos 09/02/2014, 01/04/2016  . Pneumococcal Conjugate-13 12/26/2016  . Pneumococcal Polysaccharide-23 04/27/2015  . Tdap 04/13/2012    Qualifies for Shingles Vaccine? Decline   Screening Tests Health Maintenance  Topic Date Due  . COLONOSCOPY  04/13/2010  . OPHTHALMOLOGY EXAM  12/29/2016  . HEMOGLOBIN A1C  07/31/2018  . FOOT EXAM  09/10/2018  . INFLUENZA VACCINE  11/21/2019 (Originally 06/20/2018)  . TETANUS/TDAP  04/13/2022  . PNA vac Low Risk Adult  Completed   Cancer Screenings: Lung: Low Dose CT Chest recommended if Age 9-80 years, 30 pack-year currently smoking OR have quit w/in 15years. Patient does not qualify. Colorectal: up to date   Additional Screenings:  Hepatitis C Screening:low risk       Plan:  - Low carbohydrates,low saturated fats and high vegetable diet and exercise as tolerated  -  Declined Zoster vaccine   I have personally reviewed and noted the following in the patient's chart:   . Medical and social history . Use of alcohol, tobacco or illicit drugs  . Current medications and supplements . Functional ability and status . Nutritional status . Physical activity . Advanced directives . List of other physicians . Hospitalizations, surgeries, and ER visits in previous  12 months . Vitals . Screenings to include cognitive, depression, and falls . Referrals and appointments  In addition, I have reviewed and discussed with patient certain preventive protocols, quality metrics, and best practice recommendations. A written personalized care plan for preventive services as well as general preventive health recommendations were provided to patient.    Caesar Bookman, NP  01/31/2019

## 2019-02-04 ENCOUNTER — Encounter: Payer: Medicare Other | Admitting: Family

## 2019-02-17 ENCOUNTER — Other Ambulatory Visit: Payer: Self-pay

## 2019-02-17 NOTE — Patient Outreach (Signed)
[facility]) Care Management  02/17/2019  [name] 29-Jan-1941 [id number]   Medication Adherence call to [name]  HIPPA Compliant Voice message left with a call back number. [name] is showing past due on Trajenta 5 mg under [employer].   Gary Mcneil CPhT Pharmacy Technician [facility] Care Management Direct Dial [phone]  Fax [phone] [email or web address]

## 2019-02-20 ENCOUNTER — Other Ambulatory Visit: Payer: Self-pay | Admitting: Nurse Practitioner

## 2019-02-20 DIAGNOSIS — E1140 Type 2 diabetes mellitus with diabetic neuropathy, unspecified: Secondary | ICD-10-CM

## 2019-02-25 ENCOUNTER — Telehealth: Payer: Self-pay

## 2019-02-25 NOTE — Telephone Encounter (Signed)
Per Shanda Bumps patient is overdue for fasting labs. Labs due prior to any additional refills.  I called patient's son to confirm that he will be available to do Tele-visit tomorrow and he gave confirmation. I offered to schedule an appointment for fasting labs (earliest available date) and Rocky Link (patient's son) states he would not be available to bring patient until the afternoon. Patient not able to fast until the afternoon. Rocky Link will review his schedule and further address tomorrow during Tele-visit.   Jessica aware of interaction. Shanda Bumps gave verbal that she will place orders for labs during tomorrow's Tele-Visit

## 2019-02-26 ENCOUNTER — Encounter: Payer: Medicare Other | Admitting: Nurse Practitioner

## 2019-02-26 ENCOUNTER — Other Ambulatory Visit: Payer: Self-pay

## 2019-02-26 ENCOUNTER — Telehealth: Payer: Self-pay

## 2019-02-26 NOTE — Telephone Encounter (Signed)
Called patient at 3:30p, son was pulling up at the patient's house. Gave 5 mins (called back) to get settled and Rocky Link (the son) was unable to find the patient at home. Gave him 15 mins to get back home, no success. Gave patient another 10 mins and patient has yet to arrive home with a cell phone going straight to voicemail according to son. Called son back at 4p who states the patient still hasn't arrived. He is a bit concerned and will call back to reschedule.

## 2019-02-27 NOTE — Progress Notes (Signed)
This encounter was created in error - please disregard.

## 2019-03-03 ENCOUNTER — Other Ambulatory Visit: Payer: Self-pay

## 2019-03-03 ENCOUNTER — Encounter: Payer: Self-pay | Admitting: Nurse Practitioner

## 2019-03-03 ENCOUNTER — Ambulatory Visit (INDEPENDENT_AMBULATORY_CARE_PROVIDER_SITE_OTHER): Payer: Medicare Other | Admitting: Nurse Practitioner

## 2019-03-03 DIAGNOSIS — I10 Essential (primary) hypertension: Secondary | ICD-10-CM | POA: Diagnosis not present

## 2019-03-03 DIAGNOSIS — E876 Hypokalemia: Secondary | ICD-10-CM

## 2019-03-03 DIAGNOSIS — E782 Mixed hyperlipidemia: Secondary | ICD-10-CM

## 2019-03-03 DIAGNOSIS — E1140 Type 2 diabetes mellitus with diabetic neuropathy, unspecified: Secondary | ICD-10-CM | POA: Diagnosis not present

## 2019-03-03 DIAGNOSIS — F0150 Vascular dementia without behavioral disturbance: Secondary | ICD-10-CM

## 2019-03-03 DIAGNOSIS — M109 Gout, unspecified: Secondary | ICD-10-CM

## 2019-03-03 NOTE — Progress Notes (Signed)
This service is provided via telemedicine  No vital signs collected/recorded due to the encounter was a telemedicine visit.   Location of patient (ex: home, work):  Home   Patient consents to a telephone visit:  Yes  Location of the provider (ex: office, home):  [facility]   Names of all persons participating in the telemedicine service and their role in the encounter:  Gary Mcneil, Gary ChattersJessica Makyra Corprew, NP, [name](Gary Mcneil) and Patient    Time spent on call: 6 min with medical assistant   Virtual Visit via Telephone Note  I connected with Gary Mcneil on 03/03/19 at  3:15 PM EDT by telephone and verified that I am speaking with the correct person using two identifiers.   I discussed the limitations, risks, security and privacy concerns of performing an evaluation and management service by telephone and the availability of in person appointments. I also discussed with the patient that there may be a patient responsible charge related to this service. The patient expressed understanding and agreed to proceed.     Careteam: Patient Care Team: Gary Mcneil, Tyeasha Ebbs K, NP as PCP - General (Nurse Practitioner)  Advanced Directive information Does Patient Have a Medical Advance Directive?: Yes, Type of Advance Directive: Living will;Healthcare Power of Attorney  Allergies  Allergen Reactions  . Penicillins Rash    Chief Complaint  Patient presents with  . Medical Management of Chronic Issues    Follow-up (missed previous follow-up appointments) Tele visit   . Best Practice Recommendations    Discuss need for eye exam   . Medication Management    Discuss need for pain medication. Patient states he has pain related to gout     HPI: Patient is a 78 y.o. male with hx of dementia, gout, hyperlipidemia, DM, htn and others.   Dementia-Taking namzaric 28-10 mg daily, Lives alone, son visits him daily. still driving but does not drive at night, son reports he is  doing well with driving. No concerns with this.  Memory without significant decline in the last 2 year. Son does his finances and medications.  Gout- no recent flares but reports mild pain all the time. Sometimes will get worse if he is not drinking enough water.  continues on allopurinol.   Hyperlipidemia- currently on Lipitor 20 mg daily. Will missed occasionally. No diet modifications, eats "terrible" per son  htn- cozaar 100 mg daily for blood pressure, poor diet. Does not check at home.   DM- taking metformin 500 mg by mouth daily and  tradjenta 5 mg daily for diabetes. No hypoglycemia. Does not check blood sugar at home.   Hypokalemia-  Continues on potassium supplement.   Review of Systems:  Review of Systems  Constitutional: Negative for chills, fever and weight loss.  HENT: Negative for tinnitus.   Respiratory: Negative for cough, sputum production and shortness of breath.   Cardiovascular: Negative for chest pain, palpitations and leg swelling.  Gastrointestinal: Negative for abdominal pain, constipation, diarrhea and heartburn.  Genitourinary: Negative for dysuria, frequency and urgency.  Musculoskeletal: Positive for joint pain (feet ?gout). Negative for back pain, falls and myalgias.  Skin: Negative.   Neurological: Positive for tingling (feet and legs). Negative for dizziness and headaches.  Psychiatric/Behavioral: Positive for memory loss. Negative for depression. The patient is not nervous/anxious and does not have insomnia.     Past Medical History:  Diagnosis Date  . "walking corpse" syndrome   . Acute kidney failure, unspecified (HCC)   .  Acute kidney failure, unspecified (HCC)   . Anemia, unspecified   . Arthritis   . Dementia   . Dementia (HCC) 04/24/2012  . Disorder of bone and cartilage, unspecified   . Dizziness and giddiness   . GERD (gastroesophageal reflux disease)   . Gout   . Gout, unspecified   . Hypertension   . Hypopotassemia   .  Hypotension, unspecified   . Hypotension, unspecified   . Memory loss   . Memory loss   . Muscle weakness (generalized)   . Myalgia and myositis, unspecified   . Neuromuscular disorder (HCC)   . Other and unspecified hyperlipidemia   . Screening for ischemic heart disease   . Tobacco use disorder   . Type II or unspecified type diabetes mellitus without mention of complication, uncontrolled   . Type II or unspecified type diabetes mellitus without mention of complication, uncontrolled   . Unspecified hereditary and idiopathic peripheral neuropathy   . Unspecified vitamin D deficiency    Past Surgical History:  Procedure Laterality Date  . INGUINAL HERNIA REPAIR    . VIDEO ASSISTED THORACOSCOPY (VATS)/EMPYEMA Right 04/30/2015   Procedure: VIDEO ASSISTED THORACOSCOPY (VATS)/DRAIN EMPYEMA;  Surgeon: [name], MD;  Location: [facility] OR;  Service: Thoracic;  Laterality: Right;  [name] VIDEO BRONCHOSCOPY N/A 04/30/2015   Procedure: VIDEO BRONCHOSCOPY;  Surgeon: [name], MD;  Location[facility] OR;  Service: Thoracic;  Laterality: N/A;   Social History:   reports that he quit smoking about 49 years ago. He has never used smokeless tobacco. He reports that he does not drink alcohol or use drugs.  Family History  Problem Relation Age of Onset  . Diabetes Mother   . Hypertension Mother   . Diabetes Father   . Hypertension Father   . Kidney disease Father   . Diabetes Sister   . Kidney disease Brother     Medications: Patient's Medications  New Prescriptions   No medications on file  Previous Medications   ALLOPURINOL (ZYLOPRIM) 300 MG TABLET    TAKE 1 TABLET(300 MG) BY MOUTH DAILY   ATORVASTATIN (LIPITOR) 20 MG TABLET    TAKE 1 TABLET BY MOUTH DAILY AT 6 PM   LOSARTAN (COZAAR) 100 MG TABLET    TAKE 1 TABLET(100 MG) BY MOUTH DAILY   METFORMIN (GLUCOPHAGE) 500 MG TABLET    TAKE 1 TABLET BY MOUTH EVERY DAY WITH BREAKFAST   MULTIPLE VITAMIN (MULTIVITAMIN WITH MINERALS) TABS TABLET     Take 1 tablet by mouth daily.   NAMZARIC 28-10 MG CP24    TAKE 1 CAPSULE BY MOUTH DAILY   POTASSIUM CHLORIDE SA (Mcneil-DUR,KLOR-CON) 20 MEQ TABLET    TAKE 2 TABLETS(40 MEQ) BY MOUTH TWICE DAILY WITH FOOD   TRADJENTA 5 MG TABS TABLET    TAKE 1 TABLET BY MOUTH EVERY DAY  Modified Medications   No medications on file  Discontinued Medications   No medications on file     Physical Exam: unable due to tele-visit.  Labs reviewed: Basic Metabolic Panel: No results for input(s): NA, Mcneil, CL, CO2, GLUCOSE, BUN, CREATININE, CALCIUM, MG, PHOS, TSH in the last 8760 hours. Liver Function Tests: No results for input(s): AST, ALT, ALKPHOS, BILITOT, PROT, ALBUMIN in the last 8760 hours. No results for input(s): LIPASE, AMYLASE in the last 8760 hours. No results for input(s): AMMONIA in the last 8760 hours. CBC: No results for input(s): WBC, NEUTROABS, HGB, HCT, MCV, PLT in the last 8760 hours. Lipid Panel: No results for  input(s): CHOL, HDL, LDLCALC, TRIG, CHOLHDL, LDLDIRECT in the last 8760 hours. TSH: No results for input(s): TSH in the last 8760 hours. A1C: Lab Results  Component Value Date   HGBA1C 7.7 (H) 01/28/2018     Assessment/Plan 1. Vascular dementia without behavioral disturbance (HCC) Stable, no acute decline. Son oversees medications and finances and helps get him to appts. Continues on namzaric daily   2. Gout, unspecified cause, unspecified chronicity, unspecified site Reports mild ache to toes/feet that he feels like is due to gout.  -he continues on allopurinol daily -will check uric acid level at this time (plans to come in office for labs tomorrow-he is overdue) - Uric acid; Future  3. Essential hypertension -stable on last OV, he does not check blood pressure at home. Continues on losartan 100 mg daily  - CBC with Differential/Platelet; Future  4. Hypokalemia Continues on potassium supplement -will follow up potassium level with labs tomorrow  5. Type 2 diabetes  mellitus with diabetic neuropathy, without long-term current use of insulin (HCC) Does not take blood sugar at home. Continue current regimen. Encouraged dietary compliance, routine foot care/monitoring and to keep up with diabetic eye exams through ophthalmology.  - Hemoglobin A1c; Future - CBC with Differential/Platelet; Future  7. Mixed hyperlipidemia -continues on lipitor, encouraged heart healthy diet. Will follow up labs, he will not be fasting because son reports he will not remember.  - Lipid Panel; Future - COMPLETE METABOLIC PANEL WITH GFR; Future  Next appt: 3 months.  Janene Harvey. Biagio Borg  [facility] [phone]  Follow Up Instructions:    I discussed the assessment and treatment plan with the patient. The patient was provided an opportunity to ask questions and all were answered. The patient agreed with the plan and demonstrated an understanding of the instructions.   The patient was advised to call back or seek an in-person evaluation if the symptoms worsen or if the condition fails to improve as anticipated.  I provided 16  minutes of non-face-to-face time during this encounter.  avs printed and mailed  Gary Seller, NP

## 2019-03-03 NOTE — Patient Instructions (Signed)
Make sure you are checking feet daily  Yearly eye exam needed due to diabetes   DASH Eating Plan DASH stands for "Dietary Approaches to Stop Hypertension." The DASH eating plan is a healthy eating plan that has been shown to reduce high blood pressure (hypertension). It may also reduce your risk for type 2 diabetes, heart disease, and stroke. The DASH eating plan may also help with weight loss. What are tips for following this plan?  General guidelines  Avoid eating more than 2,300 mg (milligrams) of salt (sodium) a day. If you have hypertension, you may need to reduce your sodium intake to 1,500 mg a day.  Limit alcohol intake to no more than 1 drink a day for nonpregnant women and 2 drinks a day for men. One drink equals 12 oz of beer, 5 oz of wine, or 1 oz of hard liquor.  Work with your health care provider to maintain a healthy body weight or to lose weight. Ask what an ideal weight is for you.  Get at least 30 minutes of exercise that causes your heart to beat faster (aerobic exercise) most days of the week. Activities may include walking, swimming, or biking.  Work with your health care provider or diet and nutrition specialist (dietitian) to adjust your eating plan to your individual calorie needs. Reading food labels   Check food labels for the amount of sodium per serving. Choose foods with less than 5 percent of the Daily Value of sodium. Generally, foods with less than 300 mg of sodium per serving fit into this eating plan.  To find whole grains, look for the word "whole" as the first word in the ingredient list. Shopping  Buy products labeled as "low-sodium" or "no salt added."  Buy fresh foods. Avoid canned foods and premade or frozen meals. Cooking  Avoid adding salt when cooking. Use salt-free seasonings or herbs instead of table salt or sea salt. Check with your health care provider or pharmacist before using salt substitutes.  Do not fry foods. Cook foods using  healthy methods such as baking, boiling, grilling, and broiling instead.  Cook with heart-healthy oils, such as olive, canola, soybean, or sunflower oil. Meal planning  Eat a balanced diet that includes: ? 5 or more servings of fruits and vegetables each day. At each meal, try to fill half of your plate with fruits and vegetables. ? Up to 6-8 servings of whole grains each day. ? Less than 6 oz of lean meat, poultry, or fish each day. A 3-oz serving of meat is about the same size as a deck of cards. One egg equals 1 oz. ? 2 servings of low-fat dairy each day. ? A serving of nuts, seeds, or beans 5 times each week. ? Heart-healthy fats. Healthy fats called Omega-3 fatty acids are found in foods such as flaxseeds and coldwater fish, like sardines, salmon, and mackerel.  Limit how much you eat of the following: ? Canned or prepackaged foods. ? Food that is high in trans fat, such as fried foods. ? Food that is high in saturated fat, such as fatty meat. ? Sweets, desserts, sugary drinks, and other foods with added sugar. ? Full-fat dairy products.  Do not salt foods before eating.  Try to eat at least 2 vegetarian meals each week.  Eat more home-cooked food and less restaurant, buffet, and fast food.  When eating at a restaurant, ask that your food be prepared with less salt or no salt, if possible. What  foods are recommended? The items listed may not be a complete list. Talk with your dietitian about what dietary choices are best for you. Grains Whole-grain or whole-wheat bread. Whole-grain or whole-wheat pasta. Brown rice. Orpah Cobb. Bulgur. Whole-grain and low-sodium cereals. Pita bread. Low-fat, low-sodium crackers. Whole-wheat flour tortillas. Vegetables Fresh or frozen vegetables (raw, steamed, roasted, or grilled). Low-sodium or reduced-sodium tomato and vegetable juice. Low-sodium or reduced-sodium tomato sauce and tomato paste. Low-sodium or reduced-sodium canned  vegetables. Fruits All fresh, dried, or frozen fruit. Canned fruit in natural juice (without added sugar). Meat and other protein foods Skinless chicken or [place]. Ground chicken or [place]. Pork with fat trimmed off. Fish and seafood. Egg whites. Dried beans, peas, or lentils. Unsalted nuts, nut butters, and seeds. Unsalted canned beans. Lean cuts of beef with fat trimmed off. Low-sodium, lean deli meat. Dairy Low-fat (1%) or fat-free (skim) milk. Fat-free, low-fat, or reduced-fat cheeses. Nonfat, low-sodium ricotta or cottage cheese. Low-fat or nonfat yogurt. Low-fat, low-sodium cheese. Fats and oils Soft margarine without trans fats. Vegetable oil. Low-fat, reduced-fat, or light mayonnaise and salad dressings (reduced-sodium). Canola, safflower, olive, soybean, and sunflower oils. Avocado. Seasoning and other foods Herbs. Spices. Seasoning mixes without salt. Unsalted popcorn and pretzels. Fat-free sweets. What foods are not recommended? The items listed may not be a complete list. Talk with your dietitian about what dietary choices are best for you. Grains Baked goods made with fat, such as croissants, muffins, or some breads. Dry pasta or rice meal packs. Vegetables Creamed or fried vegetables. Vegetables in a cheese sauce. Regular canned vegetables (not low-sodium or reduced-sodium). Regular canned tomato sauce and paste (not low-sodium or reduced-sodium). Regular tomato and vegetable juice (not low-sodium or reduced-sodium). Rosita Fire. Olives. Fruits Canned fruit in a light or heavy syrup. Fried fruit. Fruit in cream or butter sauce. Meat and other protein foods Fatty cuts of meat. Ribs. Fried meat. Tomasa Blase. Sausage. Bologna and other processed lunch meats. Salami. Fatback. Hotdogs. Bratwurst. Salted nuts and seeds. Canned beans with added salt. Canned or smoked fish. Whole eggs or egg yolks. Chicken or [place] with skin. Dairy Whole or 2% milk, cream, and half-and-half. Whole or full-fat  cream cheese. Whole-fat or sweetened yogurt. Full-fat cheese. Nondairy creamers. Whipped toppings. Processed cheese and cheese spreads. Fats and oils Butter. Stick margarine. Lard. Shortening. Ghee. Bacon fat. Tropical oils, such as coconut, palm kernel, or palm oil. Seasoning and other foods Salted popcorn and pretzels. Onion salt, garlic salt, seasoned salt, table salt, and sea salt. Worcestershire sauce. Tartar sauce. Barbecue sauce. Teriyaki sauce. Soy sauce, including reduced-sodium. Steak sauce. Canned and packaged gravies. Fish sauce. Oyster sauce. Cocktail sauce. Horseradish that you find on the shelf. Ketchup. Mustard. Meat flavorings and tenderizers. Bouillon cubes. Hot sauce and Tabasco sauce. Premade or packaged marinades. Premade or packaged taco seasonings. Relishes. Regular salad dressings. Where to find more information:  National Heart, Lung, and Blood Institute: PopSteam.is  American Heart Association: www.heart.org Summary  The DASH eating plan is a healthy eating plan that has been shown to reduce high blood pressure (hypertension). It may also reduce your risk for type 2 diabetes, heart disease, and stroke.  With the DASH eating plan, you should limit salt (sodium) intake to 2,300 mg a day. If you have hypertension, you may need to reduce your sodium intake to 1,500 mg a day.  When on the DASH eating plan, aim to eat more fresh fruits and vegetables, whole grains, lean proteins, low-fat dairy, and heart-healthy fats.  Work with  your health care provider or diet and nutrition specialist (dietitian) to adjust your eating plan to your individual calorie needs. This information is not intended to replace advice given to you by your health care provider. Make sure you discuss any questions you have with your health care provider. Document Released: 10/26/2011 Document Revised: 10/30/2016 Document Reviewed: 10/30/2016 Elsevier Interactive Patient Education  2019 [employer]

## 2019-03-04 ENCOUNTER — Other Ambulatory Visit: Payer: Self-pay

## 2019-03-04 ENCOUNTER — Other Ambulatory Visit: Payer: Medicare Other

## 2019-03-04 DIAGNOSIS — I10 Essential (primary) hypertension: Secondary | ICD-10-CM

## 2019-03-04 DIAGNOSIS — E1140 Type 2 diabetes mellitus with diabetic neuropathy, unspecified: Secondary | ICD-10-CM

## 2019-03-04 DIAGNOSIS — E782 Mixed hyperlipidemia: Secondary | ICD-10-CM

## 2019-03-04 DIAGNOSIS — M109 Gout, unspecified: Secondary | ICD-10-CM

## 2019-03-05 ENCOUNTER — Other Ambulatory Visit: Payer: Self-pay | Admitting: Nurse Practitioner

## 2019-03-05 DIAGNOSIS — M109 Gout, unspecified: Secondary | ICD-10-CM

## 2019-03-05 LAB — COMPLETE METABOLIC PANEL WITH GFR
AG Ratio: 2.3 (calc) (ref 1.0–2.5)
ALT: 20 U/L (ref 9–46)
AST: 27 U/L (ref 10–35)
Albumin: 4.6 g/dL (ref 3.6–5.1)
Alkaline phosphatase (APISO): 50 U/L (ref 35–144)
BUN: 7 mg/dL (ref 7–25)
CO2: 30 mmol/L (ref 20–32)
Calcium: 9.3 mg/dL (ref 8.6–10.3)
Chloride: 106 mmol/L (ref 98–110)
Creat: 1.15 mg/dL (ref 0.70–1.18)
GFR, Est African American: 71 mL/min/{1.73_m2} (ref >=60)
GFR, Est Non African American: 61 mL/min/{1.73_m2} (ref >=60)
Globulin: 2 g/dL (calc) (ref 1.9–3.7)
Glucose, Bld: 134 mg/dL (ref 65–139)
Potassium: 3.7 mmol/L (ref 3.5–5.3)
Sodium: 143 mmol/L (ref 135–146)
Total Bilirubin: 0.7 mg/dL (ref 0.2–1.2)
Total Protein: 6.6 g/dL (ref 6.1–8.1)

## 2019-03-05 LAB — CBC WITH DIFFERENTIAL/PLATELET
Absolute Monocytes: 395 cells/uL (ref 200–950)
Basophils Absolute: 50 cells/uL (ref 0–200)
Basophils Relative: 1.2 %
Eosinophils Absolute: 109 cells/uL (ref 15–500)
Eosinophils Relative: 2.6 %
HCT: 38 % — ABNORMAL LOW (ref 38.5–50.0)
Hemoglobin: 13.3 g/dL (ref 13.2–17.1)
Lymphs Abs: 1252 cells/uL (ref 850–3900)
MCH: 32.3 pg (ref 27.0–33.0)
MCHC: 35 g/dL (ref 32.0–36.0)
MCV: 92.2 fL (ref 80.0–100.0)
MPV: 9.7 fL (ref 7.5–12.5)
Monocytes Relative: 9.4 %
Neutro Abs: 2394 cells/uL (ref 1500–7800)
Neutrophils Relative %: 57 %
Platelets: 175 10*3/uL (ref 140–400)
RBC: 4.12 10*6/uL — ABNORMAL LOW (ref 4.20–5.80)
RDW: 14.4 % (ref 11.0–15.0)
Total Lymphocyte: 29.8 %
WBC: 4.2 10*3/uL (ref 3.8–10.8)

## 2019-03-05 LAB — HEMOGLOBIN A1C
Hgb A1c MFr Bld: 6.7 % of total Hgb — ABNORMAL HIGH (ref <5.7)
Mean Plasma Glucose: 146 (calc)
eAG (mmol/L): 8.1 (calc)

## 2019-03-05 LAB — LIPID PANEL
Cholesterol: 192 mg/dL (ref <200)
HDL: 80 mg/dL (ref >=40)
LDL Cholesterol (Calc): 75 mg/dL (calc)
Non-HDL Cholesterol (Calc): 112 mg/dL (calc) (ref <130)
Total CHOL/HDL Ratio: 2.4 (calc) (ref <5.0)
Triglycerides: 279 mg/dL — ABNORMAL HIGH (ref <150)

## 2019-03-05 LAB — URIC ACID: Uric Acid, Serum: 9.9 mg/dL — ABNORMAL HIGH (ref 4.0–8.0)

## 2019-03-05 MED ORDER — ALLOPURINOL 100 MG PO TABS: 200.0000 mg | ORAL_TABLET | Freq: Two times a day (BID) | ORAL | 1 refills | Status: DC

## 2019-04-02 ENCOUNTER — Other Ambulatory Visit: Payer: Medicare Other

## 2019-04-07 ENCOUNTER — Other Ambulatory Visit: Payer: Self-pay

## 2019-04-07 NOTE — Patient Outreach (Signed)
[facility]) Care Management  04/07/2019  KENNISON CAMPI 08/22/1941 [id number]   Medication Adherence call to [name] [employer] [employer] spoke with patient's son he explain patient is still taking both medication and set then on a pill box every week patient is due on Losartan 100 mg and Atorvastatin 20 mg under [employer].   Lillia Abed CPhT Pharmacy Technician [facility] Management Direct Dial [phone]  Fax [phone] [email or web address]

## 2019-06-06 ENCOUNTER — Other Ambulatory Visit: Payer: Self-pay | Admitting: Nurse Practitioner

## 2019-06-06 DIAGNOSIS — E1140 Type 2 diabetes mellitus with diabetic neuropathy, unspecified: Secondary | ICD-10-CM

## 2019-06-10 ENCOUNTER — Ambulatory Visit (INDEPENDENT_AMBULATORY_CARE_PROVIDER_SITE_OTHER): Payer: Medicare Other | Admitting: Nurse Practitioner

## 2019-06-10 ENCOUNTER — Encounter: Payer: Self-pay | Admitting: Nurse Practitioner

## 2019-06-10 ENCOUNTER — Other Ambulatory Visit: Payer: Self-pay

## 2019-06-10 VITALS — BP 150/88 | HR 78 | Temp 98.1°F | Ht 73.0 in | Wt 187.0 lb

## 2019-06-10 DIAGNOSIS — I10 Essential (primary) hypertension: Secondary | ICD-10-CM

## 2019-06-10 DIAGNOSIS — F22 Delusional disorders: Secondary | ICD-10-CM | POA: Insufficient documentation

## 2019-06-10 DIAGNOSIS — M109 Gout, unspecified: Secondary | ICD-10-CM

## 2019-06-10 DIAGNOSIS — F0150 Vascular dementia without behavioral disturbance: Secondary | ICD-10-CM

## 2019-06-10 DIAGNOSIS — E1140 Type 2 diabetes mellitus with diabetic neuropathy, unspecified: Secondary | ICD-10-CM | POA: Diagnosis not present

## 2019-06-10 DIAGNOSIS — E876 Hypokalemia: Secondary | ICD-10-CM

## 2019-06-10 DIAGNOSIS — E782 Mixed hyperlipidemia: Secondary | ICD-10-CM

## 2019-06-10 MED ORDER — ALLOPURINOL 100 MG PO TABS: 200.0000 mg | ORAL_TABLET | Freq: Two times a day (BID) | ORAL | 1 refills | Status: DC

## 2019-06-10 MED ORDER — METFORMIN HCL 500 MG PO TABS: ORAL_TABLET | ORAL | 1 refills | Status: AC

## 2019-06-10 MED ORDER — LINAGLIPTIN 5 MG PO TABS: 5.0000 mg | ORAL_TABLET | Freq: Every day | ORAL | 1 refills | Status: DC

## 2019-06-10 MED ORDER — LOSARTAN POTASSIUM 100 MG PO TABS: ORAL_TABLET | ORAL | 1 refills | Status: DC

## 2019-06-10 MED ORDER — POTASSIUM CHLORIDE CRYS ER 20 MEQ PO TBCR: EXTENDED_RELEASE_TABLET | ORAL | 1 refills | Status: DC

## 2019-06-10 MED ORDER — NAMZARIC 28-10 MG PO CP24: 1.0000 | ORAL_CAPSULE | Freq: Every day | ORAL | 1 refills | Status: AC

## 2019-06-10 NOTE — Patient Instructions (Addendum)
GOAL BLOOD PRESSURE <140/90 Please check at home after he takes medication   please make sure you are up to date with your yearly eye exams through your ophthalmologist.    DASH Eating Plan DASH stands for "Dietary Approaches to Stop Hypertension." The DASH eating plan is a healthy eating plan that has been shown to reduce high blood pressure (hypertension). It may also reduce your risk for type 2 diabetes, heart disease, and stroke. The DASH eating plan may also help with weight loss. What are tips for following this plan?  General guidelines  Avoid eating more than 2,300 mg (milligrams) of salt (sodium) a day. If you have hypertension, you may need to reduce your sodium intake to 1,500 mg a day.  Limit alcohol intake to no more than 1 drink a day for nonpregnant women and 2 drinks a day for men. One drink equals 12 oz of beer, 5 oz of wine, or 1 oz of hard liquor.  Work with your health care provider to maintain a healthy body weight or to lose weight. Ask what an ideal weight is for you.  Get at least 30 minutes of exercise that causes your heart to beat faster (aerobic exercise) most days of the week. Activities may include walking, swimming, or biking.  Work with your health care provider or diet and nutrition specialist (dietitian) to adjust your eating plan to your individual calorie needs. Reading food labels   Check food labels for the amount of sodium per serving. Choose foods with less than 5 percent of the Daily Value of sodium. Generally, foods with less than 300 mg of sodium per serving fit into this eating plan.  To find whole grains, look for the word "whole" as the first word in the ingredient list. Shopping  Buy products labeled as "low-sodium" or "no salt added."  Buy fresh foods. Avoid canned foods and premade or frozen meals. Cooking  Avoid adding salt when cooking. Use salt-free seasonings or herbs instead of table salt or sea salt. Check with your health care  provider or pharmacist before using salt substitutes.  Do not fry foods. Cook foods using healthy methods such as baking, boiling, grilling, and broiling instead.  Cook with heart-healthy oils, such as olive, canola, soybean, or sunflower oil. Meal planning  Eat a balanced diet that includes: ? 5 or more servings of fruits and vegetables each day. At each meal, try to fill half of your plate with fruits and vegetables. ? Up to 6-8 servings of whole grains each day. ? Less than 6 oz of lean meat, poultry, or fish each day. A 3-oz serving of meat is about the same size as a deck of cards. One egg equals 1 oz. ? 2 servings of low-fat dairy each day. ? A serving of nuts, seeds, or beans 5 times each week. ? Heart-healthy fats. Healthy fats called Omega-3 fatty acids are found in foods such as flaxseeds and coldwater fish, like sardines, salmon, and mackerel.  Limit how much you eat of the following: ? Canned or prepackaged foods. ? Food that is high in trans fat, such as fried foods. ? Food that is high in saturated fat, such as fatty meat. ? Sweets, desserts, sugary drinks, and other foods with added sugar. ? Full-fat dairy products.  Do not salt foods before eating.  Try to eat at least 2 vegetarian meals each week.  Eat more home-cooked food and less restaurant, buffet, and fast food.  When eating at a  restaurant, ask that your food be prepared with less salt or no salt, if possible. What foods are recommended? The items listed may not be a complete list. Talk with your dietitian about what dietary choices are best for you. Grains Whole-grain or whole-wheat bread. Whole-grain or whole-wheat pasta. Brown rice. [place]. Bulgur. Whole-grain and low-sodium cereals. Pita bread. Low-fat, low-sodium crackers. Whole-wheat flour tortillas. Vegetables Fresh or frozen vegetables (raw, steamed, roasted, or grilled). Low-sodium or reduced-sodium tomato and vegetable juice. Low-sodium or  reduced-sodium tomato sauce and tomato paste. Low-sodium or reduced-sodium canned vegetables. Fruits All fresh, dried, or frozen fruit. Canned fruit in natural juice (without added sugar). Meat and other protein foods Skinless chicken or [place]. Ground chicken or [place]. Pork with fat trimmed off. Fish and seafood. Egg whites. Dried beans, peas, or lentils. Unsalted nuts, nut butters, and seeds. Unsalted canned beans. Lean cuts of beef with fat trimmed off. Low-sodium, lean deli meat. Dairy Low-fat (1%) or fat-free (skim) milk. Fat-free, low-fat, or reduced-fat cheeses. Nonfat, low-sodium ricotta or cottage cheese. Low-fat or nonfat yogurt. Low-fat, low-sodium cheese. Fats and oils Soft margarine without trans fats. Vegetable oil. Low-fat, reduced-fat, or light mayonnaise and salad dressings (reduced-sodium). Canola, safflower, olive, soybean, and sunflower oils. Avocado. Seasoning and other foods Herbs. Spices. Seasoning mixes without salt. Unsalted popcorn and pretzels. Fat-free sweets. What foods are not recommended? The items listed may not be a complete list. Talk with your dietitian about what dietary choices are best for you. Grains Baked goods made with fat, such as croissants, muffins, or some breads. Dry pasta or rice meal packs. Vegetables Creamed or fried vegetables. Vegetables in a cheese sauce. Regular canned vegetables (not low-sodium or reduced-sodium). Regular canned tomato sauce and paste (not low-sodium or reduced-sodium). Regular tomato and vegetable juice (not low-sodium or reduced-sodium). Angie Fava. Olives. Fruits Canned fruit in a light or heavy syrup. Fried fruit. Fruit in cream or butter sauce. Meat and other protein foods Fatty cuts of meat. Ribs. Fried meat. Berniece Salines. Sausage. Bologna and other processed lunch meats. Salami. Fatback. Hotdogs. Bratwurst. Salted nuts and seeds. Canned beans with added salt. Canned or smoked fish. Whole eggs or egg yolks. Chicken or [place]  with skin. Dairy Whole or 2% milk, cream, and half-and-half. Whole or full-fat cream cheese. Whole-fat or sweetened yogurt. Full-fat cheese. Nondairy creamers. Whipped toppings. Processed cheese and cheese spreads. Fats and oils Butter. Stick margarine. Lard. Shortening. Ghee. Bacon fat. Tropical oils, such as coconut, palm kernel, or palm oil. Seasoning and other foods Salted popcorn and pretzels. Onion salt, garlic salt, seasoned salt, table salt, and sea salt. Worcestershire sauce. Tartar sauce. Barbecue sauce. Teriyaki sauce. Soy sauce, including reduced-sodium. Steak sauce. Canned and packaged gravies. Fish sauce. Oyster sauce. Cocktail sauce. Horseradish that you find on the shelf. Ketchup. Mustard. Meat flavorings and tenderizers. Bouillon cubes. Hot sauce and Tabasco sauce. Premade or packaged marinades. Premade or packaged taco seasonings. Relishes. Regular salad dressings. Where to find more information:  National Heart, Lung, and [place]: https://wilson-eaton.com/  American Heart Association: www.heart.org Summary  The DASH eating plan is a healthy eating plan that has been shown to reduce high blood pressure (hypertension). It may also reduce your risk for type 2 diabetes, heart disease, and stroke.  With the DASH eating plan, you should limit salt (sodium) intake to 2,300 mg a day. If you have hypertension, you may need to reduce your sodium intake to 1,500 mg a day.  When on the DASH eating plan, aim to eat more  fresh fruits and vegetables, whole grains, lean proteins, low-fat dairy, and heart-healthy fats.  Work with your health care provider or diet and nutrition specialist (dietitian) to adjust your eating plan to your individual calorie needs. This information is not intended to replace advice given to you by your health care provider. Make sure you discuss any questions you have with your health care provider. Document Released: 10/26/2011 Document Revised: 10/19/2017  Document Reviewed: 10/30/2016 Elsevier Patient Education  2020 [employer].

## 2019-06-10 NOTE — Progress Notes (Signed)
Careteam: Patient Care Team: Sharon SellerEubanks, Jessica K, NP as PCP - General (Nurse Practitioner)  Advanced Directive information    Allergies  Allergen Reactions  . Penicillins Rash    Chief Complaint  Patient presents with  . Medical Management of Chronic Issues    3 month follow-up Here with daughter Clydie BraunKaren   . Immunizations    Refused shingrix   . Quality Metric Gaps    Discus need for eye exam      HPI: Patient is a 78 y.o. male seen in the office today for routine follow up.  Dementia-Taking namzaric 28-10 mg daily, Lives alone, son and daughter visits him daily. still driving but does not drive at night, son reports he is doing well with driving and rides with him occasionally per daughter. Son does his finances and medications. Daughter reports no recent changes.   Gout- no recent flare,  continues on allopurinol. Daughter states that she feels like he is pretty compliant with medication, son sets his pills up.   Hyperlipidemia- currently on Lipitor 20 mg daily. LDL 75 on labs in April.   htn- cozaar 100 mg daily for blood pressure, poor diet. Does not check at home. did not take blood pressure medication today prior to visit.   DM- a1c 6.7, taking metformin 500 mg by mouth daily and  tradjenta 5 mg dailyfor diabetes. No hypoglycemia. Does not check blood sugar at home.   Hypokalemia-  Continues on potassium supplement.  Potassium 3.7 on last labs.    Review of Systems:  Review of Systems  Constitutional: Negative for chills, fever and weight loss.  HENT: Negative for tinnitus.   Respiratory: Negative for cough, sputum production and shortness of breath.   Cardiovascular: Negative for chest pain, palpitations and leg swelling.  Gastrointestinal: Negative for abdominal pain, constipation, diarrhea and heartburn.  Genitourinary: Negative for dysuria, frequency and urgency.  Musculoskeletal: Negative for back pain, falls, joint pain and myalgias.  Skin:  Negative.   Neurological: Negative for dizziness and headaches.  Psychiatric/Behavioral: Positive for memory loss. Negative for depression. The patient does not have insomnia.     Past Medical History:  Diagnosis Date  . "walking corpse" syndrome   . Acute kidney failure, unspecified (HCC)   . Acute kidney failure, unspecified (HCC)   . Anemia, unspecified   . Arthritis   . Dementia   . Dementia (HCC) 04/24/2012  . Disorder of bone and cartilage, unspecified   . Dizziness and giddiness   . GERD (gastroesophageal reflux disease)   . Gout   . Gout, unspecified   . Hypertension   . Hypopotassemia   . Hypotension, unspecified   . Hypotension, unspecified   . Memory loss   . Memory loss   . Muscle weakness (generalized)   . Myalgia and myositis, unspecified   . Neuromuscular disorder (HCC)   . Other and unspecified hyperlipidemia   . Screening for ischemic heart disease   . Tobacco use disorder   . Type II or unspecified type diabetes mellitus without mention of complication, uncontrolled   . Type II or unspecified type diabetes mellitus without mention of complication, uncontrolled   . Unspecified hereditary and idiopathic peripheral neuropathy   . Unspecified vitamin D deficiency    Past Surgical History:  Procedure Laterality Date  . INGUINAL HERNIA REPAIR    . VIDEO ASSISTED THORACOSCOPY (VATS)/EMPYEMA Right 04/30/2015   Procedure: VIDEO ASSISTED THORACOSCOPY (VATS)/DRAIN EMPYEMA;  Surgeon: Kerin PernaPeter Van Trigt, MD;  Location: [facility] OR;  Service: Thoracic;  Laterality: Right;  [name] VIDEO BRONCHOSCOPY N/A 04/30/2015   Procedure: VIDEO BRONCHOSCOPY;  Surgeon: Ivin Poot, MD;  [facility] OR;  Service: Thoracic;  Laterality: N/A;   Social History:   reports that he quit smoking about 49 years ago. He has never used smokeless tobacco. He reports that he does not drink alcohol or use drugs.  Family History  Problem Relation Age of Onset  . Diabetes Mother   . Hypertension Mother    . Diabetes Father   . Hypertension Father   . Kidney disease Father   . Diabetes Sister   . Kidney disease Brother     Medications: Patient's Medications  New Prescriptions   No medications on file  Previous Medications   ALLOPURINOL (ZYLOPRIM) 100 MG TABLET    Take 2 tablets (200 mg total) by mouth 2 (two) times daily.   ATORVASTATIN (LIPITOR) 20 MG TABLET    TAKE 1 TABLET BY MOUTH DAILY AT 6 PM   LOSARTAN (COZAAR) 100 MG TABLET    TAKE 1 TABLET(100 MG) BY MOUTH DAILY   METFORMIN (GLUCOPHAGE) 500 MG TABLET    TAKE 1 TABLET BY MOUTH EVERY DAY WITH BREAKFAST   MULTIPLE VITAMIN (MULTIVITAMIN WITH MINERALS) TABS TABLET    Take 1 tablet by mouth daily.   NAMZARIC 28-10 MG CP24    TAKE 1 CAPSULE BY MOUTH DAILY   POTASSIUM CHLORIDE SA (K-DUR,KLOR-CON) 20 MEQ TABLET    TAKE 2 TABLETS(40 MEQ) BY MOUTH TWICE DAILY WITH FOOD   TRADJENTA 5 MG TABS TABLET    TAKE 1 TABLET BY MOUTH EVERY DAY  Modified Medications   No medications on file  Discontinued Medications   No medications on file    Physical Exam:  Vitals:   06/10/19 1441  BP: (!) 150/88  Pulse: 78  Temp: 98.1 F (36.7 C)  TempSrc: Oral  SpO2: 97%  Weight: 187 lb (84.8 kg)  Height: 6\' 1"  (1.854 m)   Body mass index is 24.67 kg/m. Wt Readings from Last 3 Encounters:  06/10/19 187 lb (84.8 kg)  01/31/19 183 lb 9.6 oz (83.3 kg)  01/28/18 191 lb (86.6 kg)    Physical Exam Constitutional:      General: He is not in acute distress.    Appearance: He is well-developed. He is not diaphoretic.  HENT:     Head: Normocephalic and atraumatic.     Nose: Nose normal.     Mouth/Throat:     Pharynx: No oropharyngeal exudate.  Eyes:     Pupils: Pupils are equal, round, and reactive to light.  Neck:     Musculoskeletal: Normal range of motion and neck supple.  Cardiovascular:     Rate and Rhythm: Normal rate and regular rhythm.     Heart sounds: Normal heart sounds.  Pulmonary:     Effort: Pulmonary effort is normal.      Breath sounds: Normal breath sounds.  Abdominal:     General: Bowel sounds are normal.     Palpations: Abdomen is soft.  Musculoskeletal: Normal range of motion.  Skin:    General: Skin is warm and dry.  Neurological:     Mental Status: He is alert. Mental status is at baseline.     Comments: March 2019- MMSE 21/30  Psychiatric:        Mood and Affect: Mood normal.     Labs reviewed: Basic Metabolic Panel: Recent Labs    03/04/19 1033  NA 143  K  3.7  CL 106  CO2 30  GLUCOSE 134  BUN 7  CREATININE 1.15  CALCIUM 9.3   Liver Function Tests: Recent Labs    03/04/19 1033  AST 27  ALT 20  BILITOT 0.7  PROT 6.6   No results for input(s): LIPASE, AMYLASE in the last 8760 hours. No results for input(s): AMMONIA in the last 8760 hours. CBC: Recent Labs    03/04/19 1033  WBC 4.2  NEUTROABS 2,394  HGB 13.3  HCT 38.0*  MCV 92.2  PLT 175   Lipid Panel: Recent Labs    03/04/19 1033  CHOL 192  HDL 80  LDLCALC 75  TRIG 279*  CHOLHDL 2.4   TSH: No results for input(s): TSH in the last 8760 hours. A1C: Lab Results  Component Value Date   HGBA1C 6.7 (H) 03/04/2019     Assessment/Plan 1. Gout, unspecified cause, unspecified chronicity, unspecified site -follow up uric acid level today. - allopurinol (ZYLOPRIM) 100 MG tablet; Take 2 tablets (200 mg total) by mouth 2 (two) times daily.  Dispense: 120 tablet; Refill: 1  2. Essential hypertension -has not taken medications today. Dash diet given. Goal bp <140/90, daughter will monitor at home after medication given. - losartan (COZAAR) 100 MG tablet; TAKE 1 TABLET(100 MG) BY MOUTH DAILY  Dispense: 90 tablet; Refill: 1  3. Type 2 diabetes mellitus with diabetic neuropathy, without long-term current use of insulin (HCC) -encouraged dietary compliance, will continue medications.  - metFORMIN (GLUCOPHAGE) 500 MG tablet; TAKE 1 TABLET BY MOUTH EVERY DAY WITH BREAKFAST  Dispense: 90 tablet; Refill: 1 - linagliptin  (TRADJENTA) 5 MG TABS tablet; Take 1 tablet (5 mg total) by mouth daily.  Dispense: 90 tablet; Refill: 1 - Hemoglobin A1c - COMPLETE METABOLIC PANEL WITH GFR  4. Vascular dementia without behavioral disturbance (HCC) Stable, continues to have close monitoring by family. - Memantine HCl-Donepezil HCl (NAMZARIC) 28-10 MG CP24; Take 1 capsule by mouth daily.  Dispense: 90 capsule; Refill: 1  5. Mixed hyperlipidemia LDL at goal, triglycerides were elevated on last labs but he was not fasting, continues on lipitor 20 mg daily   6. Hypokalemia - potassium chloride SA (K-DUR) 20 MEQ tablet; TAKE 2 TABLETS(40 MEQ) BY MOUTH TWICE DAILY WITH FOOD  Dispense: 120 tablet; Refill: 1    Next appt: 4 months for routine follow up Jessica K. Biagio BorgEubanks, AGNP  [facility] [phone]

## 2019-06-11 LAB — COMPLETE METABOLIC PANEL WITH GFR
AG Ratio: 2.2 (calc) (ref 1.0–2.5)
ALT: 15 U/L (ref 9–46)
AST: 24 U/L (ref 10–35)
Albumin: 4.3 g/dL (ref 3.6–5.1)
Alkaline phosphatase (APISO): 46 U/L (ref 35–144)
BUN: 7 mg/dL (ref 7–25)
CO2: 28 mmol/L (ref 20–32)
Calcium: 9.3 mg/dL (ref 8.6–10.3)
Chloride: 108 mmol/L (ref 98–110)
Creat: 0.97 mg/dL (ref 0.70–1.18)
GFR, Est African American: 88 mL/min/{1.73_m2} (ref >=60)
GFR, Est Non African American: 76 mL/min/{1.73_m2} (ref >=60)
Globulin: 2 g/dL (calc) (ref 1.9–3.7)
Glucose, Bld: 107 mg/dL (ref 65–139)
Potassium: 3.5 mmol/L (ref 3.5–5.3)
Sodium: 142 mmol/L (ref 135–146)
Total Bilirubin: 0.6 mg/dL (ref 0.2–1.2)
Total Protein: 6.3 g/dL (ref 6.1–8.1)

## 2019-06-11 LAB — URIC ACID: Uric Acid, Serum: 5.1 mg/dL (ref 4.0–8.0)

## 2019-06-11 LAB — HEMOGLOBIN A1C
Hgb A1c MFr Bld: 6.6 % of total Hgb — ABNORMAL HIGH (ref <5.7)
Mean Plasma Glucose: 143 (calc)
eAG (mmol/L): 7.9 (calc)

## 2019-06-27 ENCOUNTER — Telehealth: Payer: Self-pay | Admitting: Nurse Practitioner

## 2019-06-27 NOTE — Telephone Encounter (Signed)
Patient's son Khristian Phillippi called.  He stated that he is needs a letter stating that his father has dementia and the time frame that he has had this. He states the letter is needed due to  concerns with IRS and taxes

## 2019-06-30 NOTE — Telephone Encounter (Signed)
Completed.

## 2019-06-30 NOTE — Telephone Encounter (Signed)
Son notified and requested to be mailed to: [address] [place] [address]  Mailed.

## 2019-08-04 ENCOUNTER — Other Ambulatory Visit: Payer: Self-pay | Admitting: Nurse Practitioner

## 2019-08-05 ENCOUNTER — Other Ambulatory Visit: Payer: Self-pay | Admitting: Nurse Practitioner

## 2019-08-05 DIAGNOSIS — M109 Gout, unspecified: Secondary | ICD-10-CM

## 2019-10-06 ENCOUNTER — Other Ambulatory Visit: Payer: Self-pay

## 2019-10-06 ENCOUNTER — Encounter: Payer: Self-pay | Admitting: Nurse Practitioner

## 2019-10-06 ENCOUNTER — Ambulatory Visit (INDEPENDENT_AMBULATORY_CARE_PROVIDER_SITE_OTHER): Payer: Medicare Other | Admitting: Nurse Practitioner

## 2019-10-06 VITALS — BP 140/92 | HR 90 | Temp 97.7°F | Ht 73.0 in | Wt 177.4 lb

## 2019-10-06 DIAGNOSIS — F0150 Vascular dementia without behavioral disturbance: Secondary | ICD-10-CM

## 2019-10-06 DIAGNOSIS — Z23 Encounter for immunization: Secondary | ICD-10-CM

## 2019-10-06 DIAGNOSIS — E876 Hypokalemia: Secondary | ICD-10-CM

## 2019-10-06 DIAGNOSIS — E1140 Type 2 diabetes mellitus with diabetic neuropathy, unspecified: Secondary | ICD-10-CM

## 2019-10-06 DIAGNOSIS — M109 Gout, unspecified: Secondary | ICD-10-CM

## 2019-10-06 DIAGNOSIS — I10 Essential (primary) hypertension: Secondary | ICD-10-CM

## 2019-10-06 MED ORDER — ALLOPURINOL 100 MG PO TABS: 200.0000 mg | ORAL_TABLET | Freq: Every day | ORAL | 1 refills | Status: DC

## 2019-10-06 NOTE — Patient Instructions (Addendum)
Cut back on your alcohol intake.   GOAL BP <140/90 To call if you get several readings over several days above this.   DASH Eating Plan DASH stands for "Dietary Approaches to Stop Hypertension." The DASH eating plan is a healthy eating plan that has been shown to reduce high blood pressure (hypertension). It may also reduce your risk for type 2 diabetes, heart disease, and stroke. The DASH eating plan may also help with weight loss. What are tips for following this plan?  General guidelines  Avoid eating more than 2,300 mg (milligrams) of salt (sodium) a day. If you have hypertension, you may need to reduce your sodium intake to 1,500 mg a day.  Limit alcohol intake to no more than 1 drink a day for nonpregnant women and 2 drinks a day for men. One drink equals 12 oz of beer, 5 oz of wine, or 1 oz of hard liquor.  Work with your health care provider to maintain a healthy body weight or to lose weight. Ask what an ideal weight is for you.  Get at least 30 minutes of exercise that causes your heart to beat faster (aerobic exercise) most days of the week. Activities may include walking, swimming, or biking.  Work with your health care provider or diet and nutrition specialist (dietitian) to adjust your eating plan to your individual calorie needs. Reading food labels   Check food labels for the amount of sodium per serving. Choose foods with less than 5 percent of the Daily Value of sodium. Generally, foods with less than 300 mg of sodium per serving fit into this eating plan.  To find whole grains, look for the word "whole" as the first word in the ingredient list. Shopping  Buy products labeled as "low-sodium" or "no salt added."  Buy fresh foods. Avoid canned foods and premade or frozen meals. Cooking  Avoid adding salt when cooking. Use salt-free seasonings or herbs instead of table salt or sea salt. Check with your health care provider or pharmacist before using salt  substitutes.  Do not fry foods. Cook foods using healthy methods such as baking, boiling, grilling, and broiling instead.  Cook with heart-healthy oils, such as olive, canola, soybean, or sunflower oil. Meal planning  Eat a balanced diet that includes: ? 5 or more servings of fruits and vegetables each day. At each meal, try to fill half of your plate with fruits and vegetables. ? Up to 6-8 servings of whole grains each day. ? Less than 6 oz of lean meat, poultry, or fish each day. A 3-oz serving of meat is about the same size as a deck of cards. One egg equals 1 oz. ? 2 servings of low-fat dairy each day. ? A serving of nuts, seeds, or beans 5 times each week. ? Heart-healthy fats. Healthy fats called Omega-3 fatty acids are found in foods such as flaxseeds and coldwater fish, like sardines, salmon, and mackerel.  Limit how much you eat of the following: ? Canned or prepackaged foods. ? Food that is high in trans fat, such as fried foods. ? Food that is high in saturated fat, such as fatty meat. ? Sweets, desserts, sugary drinks, and other foods with added sugar. ? Full-fat dairy products.  Do not salt foods before eating.  Try to eat at least 2 vegetarian meals each week.  Eat more home-cooked food and less restaurant, buffet, and fast food.  When eating at a restaurant, ask that your food be prepared with  less salt or no salt, if possible. What foods are recommended? The items listed may not be a complete list. Talk with your dietitian about what dietary choices are best for you. Grains Whole-grain or whole-wheat bread. Whole-grain or whole-wheat pasta. Brown rice. [place]. Bulgur. Whole-grain and low-sodium cereals. Pita bread. Low-fat, low-sodium crackers. Whole-wheat flour tortillas. Vegetables Fresh or frozen vegetables (raw, steamed, roasted, or grilled). Low-sodium or reduced-sodium tomato and vegetable juice. Low-sodium or reduced-sodium tomato sauce and tomato  paste. Low-sodium or reduced-sodium canned vegetables. Fruits All fresh, dried, or frozen fruit. Canned fruit in natural juice (without added sugar). Meat and other protein foods Skinless chicken or [place]. Ground chicken or [place]. Pork with fat trimmed off. Fish and seafood. Egg whites. Dried beans, peas, or lentils. Unsalted nuts, nut butters, and seeds. Unsalted canned beans. Lean cuts of beef with fat trimmed off. Low-sodium, lean deli meat. Dairy Low-fat (1%) or fat-free (skim) milk. Fat-free, low-fat, or reduced-fat cheeses. Nonfat, low-sodium ricotta or cottage cheese. Low-fat or nonfat yogurt. Low-fat, low-sodium cheese. Fats and oils Soft margarine without trans fats. Vegetable oil. Low-fat, reduced-fat, or light mayonnaise and salad dressings (reduced-sodium). Canola, safflower, olive, soybean, and sunflower oils. Avocado. Seasoning and other foods Herbs. Spices. Seasoning mixes without salt. Unsalted popcorn and pretzels. Fat-free sweets. What foods are not recommended? The items listed may not be a complete list. Talk with your dietitian about what dietary choices are best for you. Grains Baked goods made with fat, such as croissants, muffins, or some breads. Dry pasta or rice meal packs. Vegetables Creamed or fried vegetables. Vegetables in a cheese sauce. Regular canned vegetables (not low-sodium or reduced-sodium). Regular canned tomato sauce and paste (not low-sodium or reduced-sodium). Regular tomato and vegetable juice (not low-sodium or reduced-sodium). Angie Fava. Olives. Fruits Canned fruit in a light or heavy syrup. Fried fruit. Fruit in cream or butter sauce. Meat and other protein foods Fatty cuts of meat. Ribs. Fried meat. Berniece Salines. Sausage. Bologna and other processed lunch meats. Salami. Fatback. Hotdogs. Bratwurst. Salted nuts and seeds. Canned beans with added salt. Canned or smoked fish. Whole eggs or egg yolks. Chicken or [place] with skin. Dairy Whole or 2% milk,  cream, and half-and-half. Whole or full-fat cream cheese. Whole-fat or sweetened yogurt. Full-fat cheese. Nondairy creamers. Whipped toppings. Processed cheese and cheese spreads. Fats and oils Butter. Stick margarine. Lard. Shortening. Ghee. Bacon fat. Tropical oils, such as coconut, palm kernel, or palm oil. Seasoning and other foods Salted popcorn and pretzels. Onion salt, garlic salt, seasoned salt, table salt, and sea salt. Worcestershire sauce. Tartar sauce. Barbecue sauce. Teriyaki sauce. Soy sauce, including reduced-sodium. Steak sauce. Canned and packaged gravies. Fish sauce. Oyster sauce. Cocktail sauce. Horseradish that you find on the shelf. Ketchup. Mustard. Meat flavorings and tenderizers. Bouillon cubes. Hot sauce and Tabasco sauce. Premade or packaged marinades. Premade or packaged taco seasonings. Relishes. Regular salad dressings. Where to find more information:  National Heart, Lung, and [place]: https://wilson-eaton.com/  American Heart Association: www.heart.org Summary  The DASH eating plan is a healthy eating plan that has been shown to reduce high blood pressure (hypertension). It may also reduce your risk for type 2 diabetes, heart disease, and stroke.  With the DASH eating plan, you should limit salt (sodium) intake to 2,300 mg a day. If you have hypertension, you may need to reduce your sodium intake to 1,500 mg a day.  When on the DASH eating plan, aim to eat more fresh fruits and vegetables, whole grains, lean proteins,  low-fat dairy, and heart-healthy fats.  Work with your health care provider or diet and nutrition specialist (dietitian) to adjust your eating plan to your individual calorie needs. This information is not intended to replace advice given to you by your health care provider. Make sure you discuss any questions you have with your health care provider. Document Released: 10/26/2011 Document Revised: 10/19/2017 Document Reviewed: 10/30/2016 Elsevier  Patient Education  2020 [employer].

## 2019-10-06 NOTE — Progress Notes (Signed)
Careteam: Patient Care Team: Lauree Chandler, NP as PCP - General (Nurse Practitioner)  Advanced Directive information Does Patient Have a Medical Advance Directive?: Yes, Type of Advance Directive: [place], Does patient want to make changes to medical advance directive?: No - Patient declined  Allergies  Allergen Reactions  . Penicillins Rash    Chief Complaint  Patient presents with  . Medical Management of Chronic Issues    4 month follow-up. Here with Son   . Medication Management    Patient taking all medications once daily due to son administering. Son is unable to administer more than once daily.   . Immunizations    Flu vaccine today      HPI: Patient is a 78 y.o. 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 Son and daughter is trying to get stuff done but it is a process due to both of them having full time jobs.   States he is only taking medication once daily- despite what directions are he is only able to ensure he takes medication once a day  Hypokalemia- taking potassium only once daily   DM- not up to date with routine eye exam. Does not check blood sugars routinely. Taking metformin daily and tradjenta with breakfast.   GOUT- only taking allopurinol 100 mg 2 tablet daily- will not take twice daily  Dementia- sometimes he is doing alcohol so it seems worse. Continues on namzaric.   ETOH use- reports he "has 1 drink a day" son reports he is not drinking enough water. Unsure how much alcohol exactly he gets a day but ~4 oz a day   Review of Systems:  Review of Systems  Constitutional: Negative for chills, fever and weight loss.  HENT: Negative for hearing loss.   Respiratory: Negative for cough, sputum production and shortness of breath.   Cardiovascular: Negative for chest pain, palpitations and leg swelling.  Gastrointestinal: Negative for  abdominal pain, constipation, diarrhea and heartburn.  Genitourinary: Negative for dysuria, frequency and urgency.  Musculoskeletal: Negative for back pain, falls, joint pain and myalgias.  Skin: Negative.   Neurological: Negative for dizziness and headaches.  Psychiatric/Behavioral: Positive for memory loss. Negative for depression. The patient does not have insomnia.     Past Medical History:  Diagnosis Date  . "walking corpse" syndrome   . Acute kidney failure, unspecified ([place])   . Acute kidney failure, unspecified ([place])   . Anemia, unspecified   . Arthritis   . Dementia   . Dementia ([place]) 04/24/2012  . Disorder of bone and cartilage, unspecified   . Dizziness and giddiness   . GERD (gastroesophageal reflux disease)   . Gout   . Gout, unspecified   . Hypertension   . Hypopotassemia   . Hypotension, unspecified   . Hypotension, unspecified   . Memory loss   . Memory loss   . Muscle weakness (generalized)   . Myalgia and myositis, unspecified   . Neuromuscular disorder ([place])   . Other and unspecified hyperlipidemia   . Screening for ischemic heart disease   . Tobacco use disorder   . Type II or unspecified type diabetes mellitus without mention of complication, uncontrolled   . Type II or unspecified type diabetes mellitus without mention of complication, uncontrolled   . Unspecified hereditary and idiopathic peripheral neuropathy   . Unspecified vitamin D deficiency  Past Surgical History:  Procedure Laterality Date  . INGUINAL HERNIA REPAIR    . VIDEO ASSISTED THORACOSCOPY (VATS)/EMPYEMA Right 04/30/2015   Procedure: VIDEO ASSISTED THORACOSCOPY (VATS)/DRAIN EMPYEMA;  Surgeon: Peter Van Trigt, MD;  Location: [facility] OR;  Service: Thoracic;  Laterality: Right;  . VIDEO BRONCHOSCOPY N/A 04/30/2015   Procedure: VIDEO BRONCHOSCOPY;  Surgeon: Peter Van Trigt, MD;  Location: [facility] OR;  Service: Thoracic;  Laterality: N/A;   Social History:   reports that he quit smoking about 49  years ago. He has never used smokeless tobacco. He reports that he does not drink alcohol or use drugs.  Family History  Problem Relation Age of Onset  . Diabetes Mother   . Hypertension Mother   . Diabetes Father   . Hypertension Father   . Kidney disease Father   . Diabetes Sister   . Kidney disease Brother     Medications: Patient's Medications  New Prescriptions   No medications on file  Previous Medications   ALLOPURINOL (ZYLOPRIM) 100 MG TABLET    TAKE 2 TABLETS(200 MG) BY MOUTH TWICE DAILY   ATORVASTATIN (LIPITOR) 20 MG TABLET    TAKE 1 TABLET BY MOUTH DAILY AT 6 PM   LINAGLIPTIN (TRADJENTA) 5 MG TABS TABLET    Take 1 tablet (5 mg total) by mouth daily.   LOSARTAN (COZAAR) 100 MG TABLET    TAKE 1 TABLET(100 MG) BY MOUTH DAILY   MEMANTINE HCL-DONEPEZIL HCL (NAMZARIC) 28-10 MG CP24    Take 1 capsule by mouth daily.   METFORMIN (GLUCOPHAGE) 500 MG TABLET    TAKE 1 TABLET BY MOUTH EVERY DAY WITH BREAKFAST   MULTIPLE VITAMIN (MULTIVITAMIN WITH MINERALS) TABS TABLET    Take 1 tablet by mouth daily.   POTASSIUM CHLORIDE SA (K-DUR) 20 MEQ TABLET    TAKE 2 TABLETS(40 MEQ) BY MOUTH TWICE DAILY WITH FOOD  Modified Medications   No medications on file  Discontinued Medications   No medications on file    Physical Exam:  Vitals:   10/06/19 1511  BP: (!) 140/92  Pulse: 90  Temp: 97.7 F (36.5 C)  TempSrc: Temporal  SpO2: 97%  Weight: 177 lb 6.4 oz (80.5 kg)  Height: 6' 1" (1.854 m)   Body mass index is 23.41 kg/m. Wt Readings from Last 3 Encounters:  10/06/19 177 lb 6.4 oz (80.5 kg)  06/10/19 187 lb (84.8 kg)  01/31/19 183 lb 9.6 oz (83.3 kg)    Physical Exam Constitutional:      General: He is not in acute distress.    Appearance: He is well-developed. He is not diaphoretic.  HENT:     Head: Normocephalic and atraumatic.  Eyes:     Conjunctiva/sclera: Conjunctivae normal.     Pupils: Pupils are equal, round, and reactive to light.  Neck:     Musculoskeletal:  Normal range of motion and neck supple.  Cardiovascular:     Rate and Rhythm: Normal rate and regular rhythm.     Heart sounds: Normal heart sounds.  Pulmonary:     Effort: Pulmonary effort is normal.     Breath sounds: Normal breath sounds.  Abdominal:     General: Bowel sounds are normal.     Palpations: Abdomen is soft.  Musculoskeletal:        General: No tenderness.  Skin:    General: Skin is warm and dry.  Neurological:     Mental Status: He is alert and oriented to person, place, and time.       Labs reviewed: Basic Metabolic Panel: Recent Labs    03/04/19 1033 06/10/19 1502  NA 143 142  K 3.7 3.5  CL 106 108  CO2 30 28  GLUCOSE 134 107  BUN 7 7  CREATININE 1.15 0.97  CALCIUM 9.3 9.3   Liver Function Tests: Recent Labs    03/04/19 1033 06/10/19 1502  AST 27 24  ALT 20 15  BILITOT 0.7 0.6  PROT 6.6 6.3   No results for input(s): LIPASE, AMYLASE in the last 8760 hours. No results for input(s): AMMONIA in the last 8760 hours. CBC: Recent Labs    03/04/19 1033  WBC 4.2  NEUTROABS 2,394  HGB 13.3  HCT 38.0*  MCV 92.2  PLT 175   Lipid Panel: Recent Labs    03/04/19 1033  CHOL 192  HDL 80  LDLCALC 75  TRIG 279*  CHOLHDL 2.4   TSH: No results for input(s): TSH in the last 8760 hours. A1C: Lab Results  Component Value Date   HGBA1C 6.6 (H) 06/10/2019     Assessment/Plan 1. Need for influenza vaccination - Flu Vaccine QUAD High Dose(Fluad)  2. Gout, unspecified cause, unspecified chronicity, unspecified site -without recent flares, only taking allopurinol once daily and son reports "it has always been this way" uric acid level was at goal in July.  -encouraged to cut back on alcohol intake as this could be contributing to gout flares, elevated uric acid. - allopurinol (ZYLOPRIM) 100 MG tablet; Take 2 tablets (200 mg total) by mouth daily.  Dispense: 180 tablet; Refill: 1  3. Hypokalemia -only taking potassium once daily, will follow up  lab to confirm potassium is stable.  - CMP with eGFR([facility])  4. Essential hypertension -elevated today. They do not check at home and pt uses salt.  -encouraged to cut back on sodium intake. -DASH diet given  -son will check bp at home and call if blood pressure staying above 140/90 -continues on losartan 100 mg by mouth daily  5. Type 2 diabetes mellitus with diabetic neuropathy, without long-term current use of insulin ([facility]) -continues on tradjenta with metformin daily - CMP with eGFR([facility]) - Hemoglobin A1c  6. Vascular dementia without behavioral disturbance ([facility]) -continues on namzaric. Discussed cutting back and ultimately stopped ETOH use. Son reports he has tried to get him to stop but he continues to drink regardless.  -again encouraged him not to drive as he has gotten lost in the [place]). Reinforced safety.   Next appt: 6 months for routine follow up. Sooner if blood pressure remains elevated.   K. , AGNP  [facility] [phone]  

## 2019-10-07 ENCOUNTER — Other Ambulatory Visit: Payer: Self-pay

## 2019-10-07 DIAGNOSIS — E782 Mixed hyperlipidemia: Secondary | ICD-10-CM

## 2019-10-07 DIAGNOSIS — R748 Abnormal levels of other serum enzymes: Secondary | ICD-10-CM

## 2019-10-07 DIAGNOSIS — E876 Hypokalemia: Secondary | ICD-10-CM

## 2019-10-07 LAB — COMPLETE METABOLIC PANEL WITH GFR
AG Ratio: 2.1 (calc) (ref 1.0–2.5)
ALT: 53 U/L — ABNORMAL HIGH (ref 9–46)
AST: 91 U/L — ABNORMAL HIGH (ref 10–35)
Albumin: 4.4 g/dL (ref 3.6–5.1)
Alkaline phosphatase (APISO): 55 U/L (ref 35–144)
BUN: 11 mg/dL (ref 7–25)
CO2: 21 mmol/L (ref 20–32)
Calcium: 9 mg/dL (ref 8.6–10.3)
Chloride: 107 mmol/L (ref 98–110)
Creat: 1.17 mg/dL (ref 0.70–1.18)
GFR, Est African American: 70 mL/min/{1.73_m2} (ref >=60)
GFR, Est Non African American: 60 mL/min/{1.73_m2} (ref >=60)
Globulin: 2.1 g/dL (calc) (ref 1.9–3.7)
Glucose, Bld: 96 mg/dL (ref 65–99)
Potassium: 3.1 mmol/L — ABNORMAL LOW (ref 3.5–5.3)
Sodium: 142 mmol/L (ref 135–146)
Total Bilirubin: 1.1 mg/dL (ref 0.2–1.2)
Total Protein: 6.5 g/dL (ref 6.1–8.1)

## 2019-10-07 LAB — HEMOGLOBIN A1C
Hgb A1c MFr Bld: 6.8 % of total Hgb — ABNORMAL HIGH (ref <5.7)
Mean Plasma Glucose: 148 (calc)
eAG (mmol/L): 8.2 (calc)

## 2019-10-07 MED ORDER — POTASSIUM CHLORIDE CRYS ER 20 MEQ PO TBCR: 60.0000 meq | EXTENDED_RELEASE_TABLET | Freq: Every day | ORAL | 1 refills | Status: DC

## 2019-11-19 ENCOUNTER — Other Ambulatory Visit: Payer: Medicare Other

## 2019-11-19 ENCOUNTER — Telehealth: Payer: Self-pay

## 2019-11-19 ENCOUNTER — Other Ambulatory Visit: Payer: Self-pay

## 2019-11-19 DIAGNOSIS — R748 Abnormal levels of other serum enzymes: Secondary | ICD-10-CM

## 2019-11-19 DIAGNOSIS — E876 Hypokalemia: Secondary | ICD-10-CM

## 2019-11-19 DIAGNOSIS — E782 Mixed hyperlipidemia: Secondary | ICD-10-CM

## 2019-11-19 NOTE — Telephone Encounter (Signed)
Patients son called back stating he is 78 years old and his dad has been an alcoholic as long as he can remember. [name] states this is not a hot topic of conversation at every visit for patient has dementia and does not remember his alcoholic use/abuse.  [name] states he bought up the excessive alcohol use and depression at last visit. Patient was coping with depression prior to pandemic and now he is unable to access coping methods and depression is more notable.

## 2019-11-19 NOTE — Telephone Encounter (Signed)
Patient was in office today for labwork and his son stopped in the clinical area requesting a letter to submit to the [place]. Eliane Decree would like a letter indicating patient has a history of alcoholism and depression.   Eliane Decree would like a call when letter is ready for pick-up and states this is time sensitive.

## 2019-11-19 NOTE — Telephone Encounter (Signed)
I spoke with Micheline Chapman to discuss Gary Mcneil's response. Mr. Causer verbalized understanding or reply and did not have any additional needs at this time.

## 2019-11-19 NOTE — Telephone Encounter (Signed)
He has no history of depression or alcoholism on his chart therefore I can not write a letter indicating this. If there is anything else I can help him with please let me know.  Thanks.

## 2019-11-20 ENCOUNTER — Ambulatory Visit (INDEPENDENT_AMBULATORY_CARE_PROVIDER_SITE_OTHER): Payer: Medicare Other | Admitting: Nurse Practitioner

## 2019-11-20 ENCOUNTER — Other Ambulatory Visit: Payer: Self-pay | Admitting: Nurse Practitioner

## 2019-11-20 ENCOUNTER — Encounter: Payer: Self-pay | Admitting: Nurse Practitioner

## 2019-11-20 VITALS — Ht 73.0 in | Wt 177.0 lb

## 2019-11-20 DIAGNOSIS — E876 Hypokalemia: Secondary | ICD-10-CM

## 2019-11-20 DIAGNOSIS — F1010 Alcohol abuse, uncomplicated: Secondary | ICD-10-CM | POA: Diagnosis not present

## 2019-11-20 DIAGNOSIS — F0150 Vascular dementia without behavioral disturbance: Secondary | ICD-10-CM

## 2019-11-20 DIAGNOSIS — F321 Major depressive disorder, single episode, moderate: Secondary | ICD-10-CM

## 2019-11-20 DIAGNOSIS — R748 Abnormal levels of other serum enzymes: Secondary | ICD-10-CM

## 2019-11-20 LAB — LIPID PANEL
Cholesterol: 168 mg/dL (ref <200)
HDL: 102 mg/dL (ref >=40)
LDL Cholesterol (Calc): 42 mg/dL (calc)
Non-HDL Cholesterol (Calc): 66 mg/dL (calc) (ref <130)
Total CHOL/HDL Ratio: 1.6 (calc) (ref <5.0)
Triglycerides: 162 mg/dL — ABNORMAL HIGH (ref <150)

## 2019-11-20 LAB — COMPLETE METABOLIC PANEL WITH GFR
AG Ratio: 2 (calc) (ref 1.0–2.5)
ALT: 47 U/L — ABNORMAL HIGH (ref 9–46)
AST: 82 U/L — ABNORMAL HIGH (ref 10–35)
Albumin: 4.6 g/dL (ref 3.6–5.1)
Alkaline phosphatase (APISO): 62 U/L (ref 35–144)
BUN: 9 mg/dL (ref 7–25)
CO2: 27 mmol/L (ref 20–32)
Calcium: 9.4 mg/dL (ref 8.6–10.3)
Chloride: 105 mmol/L (ref 98–110)
Creat: 1.12 mg/dL (ref 0.70–1.18)
GFR, Est African American: 74 mL/min/{1.73_m2} (ref >=60)
GFR, Est Non African American: 63 mL/min/{1.73_m2} (ref >=60)
Globulin: 2.3 g/dL (calc) (ref 1.9–3.7)
Glucose, Bld: 204 mg/dL — ABNORMAL HIGH (ref 65–99)
Potassium: 2.9 mmol/L — ABNORMAL LOW (ref 3.5–5.3)
Sodium: 142 mmol/L (ref 135–146)
Total Bilirubin: 1 mg/dL (ref 0.2–1.2)
Total Protein: 6.9 g/dL (ref 6.1–8.1)

## 2019-11-20 MED ORDER — POTASSIUM CHLORIDE CRYS ER 20 MEQ PO TBCR: 60.0000 meq | EXTENDED_RELEASE_TABLET | Freq: Every day | ORAL | 3 refills | Status: DC

## 2019-11-20 NOTE — Progress Notes (Signed)
e

## 2019-11-20 NOTE — Telephone Encounter (Signed)
Made an appointment for Wednesday

## 2019-11-20 NOTE — Progress Notes (Signed)
Potassium is critically low. Has he been taking his potassium supplement daily and exactly how much has he been taking? We need to supplement this so he does not have arrhythmia as this could be fatal.  Also did he stop his cholesterol medication (Lipitor) as directed when last labs were drawn? And did he stop drinking alcohol? His liver enzymes are much worse.  We will  need to get an ultrasound of the liver at this time.

## 2019-11-20 NOTE — Progress Notes (Signed)
We had recommended that he stop drinking after last labs due to his elevated liver enzymes, now they are worse. If he feels like he needs to go through a detox program he may need to send him to the hospital for this.   Also his potassium is critically low, he will need to supplement this over the next few days and follow up labs on Monday morning. So if he has routinely been taking potassium 60 meq every morning without missing doses he should increase his potassium supplement to twice daily for 2 days- to get 60 meq which is 3 of the 20 meq tablets twice a day for today and tomorrow then to continue potassium 60 meq (3 of the 20 meq tablets) daily- it is important that someone ensures he is getting all his doses and lets have him come back into the office Monday morning to recheck lab.

## 2019-11-20 NOTE — Telephone Encounter (Signed)
Noted, this was not discussed at the last office visit.  If he feels like he needs an office visit to discuss this we can make one.

## 2019-11-20 NOTE — Progress Notes (Signed)
Patient ID: Gary Mcneil, male   DOB: 01/04/1941, 78 y.o.   MRN: [id number] This service is prEdwin Dadaovided via telemedicine  No vital signs collected/recorded due to the encounter was a telemedicine visit.   Location of patient (ex: home, work):  Home  Patient consents to a telephone visit:  yes  Location of the provider (ex: office, home):  [facility]  Name of any referring provider:  Abbey ChattersJessica Shelaine Frie, NP  Names of all persons participating in the telemedicine service and their role in the encounter:  Patient, Son, Gary Mcneil, CMA, Abbey ChattersJessica Detra Bores, NP  Time spent on call:  15:20    Careteam: Patient Care Team: Sharon SellerEubanks, Otisha Spickler K, NP as PCP - General (Nurse Practitioner)  Advanced Directive information Does Patient Have a Medical Advance Directive?: No, Would patient like information on creating a medical advance directive?: No - Patient declined, Type of Advance Directive: Healthcare Power of Attorney, Does patient want to make changes to medical advance directive?: No - Patient declined  Allergies  Allergen Reactions  . Penicillins Rash    Chief Complaint  Patient presents with  . Acute Visit    discuss patient care, Alcohol screening was 19     HPI: Patient is a 78 y.o. male due to lab results and other issues from son.  Potasium on lab was 2.9, he has potassium tablets but son reports he does not know how he is actually taking it.  Son admits that he did not remember the new instructions at the last visit to take potassium 20 meq 3 tablets in the morning and pt was ONLY taking 20 mq 1 tablet at a time.  Son gave him 3 tablets when he got to his house this house this afternoon.  Son is working tomorrow so he does not feel like he will take twice daily dosing.   Son reports pt has drank alcohol and smoked weeded for as long as he remember. He has a temper and hard to deal with when he gets angry.  Has not filed taxes in several years or paid his house payment.  He drives  himself to get his alcohol.  Unsure if the alcohol or dementia is causing the progressive memory loss.   Reports he is depressed because he makes statements like "he does not know why he is alive"  He reports none of that is documented because they have not spoken about it during the visit.   Dementia- significantly worse at this time.   Review of Systems: per pts son Review of Systems  Constitutional: Negative for chills and fever.  Respiratory: Negative for shortness of breath.   Cardiovascular: Negative for chest pain and palpitations.  Neurological: Negative for dizziness, weakness and headaches.  Psychiatric/Behavioral: Positive for depression and memory loss.    Past Medical History:  Diagnosis Date  . "walking corpse" syndrome   . Acute kidney failure, unspecified (HCC)   . Acute kidney failure, unspecified (HCC)   . Anemia, unspecified   . Arthritis   . Dementia   . Dementia (HCC) 04/24/2012  . Disorder of bone and cartilage, unspecified   . Dizziness and giddiness   . GERD (gastroesophageal reflux disease)   . Gout   . Gout, unspecified   . Hypertension   . Hypopotassemia   . Hypotension, unspecified   . Hypotension, unspecified   . Memory loss   . Memory loss   . Muscle weakness (generalized)   . Myalgia and myositis, unspecified   .  Neuromuscular disorder ([place])   . Other and unspecified hyperlipidemia   . Screening for ischemic heart disease   . Tobacco use disorder   . Type II or unspecified type diabetes mellitus without mention of complication, uncontrolled   . Type II or unspecified type diabetes mellitus without mention of complication, uncontrolled   . Unspecified hereditary and idiopathic peripheral neuropathy   . Unspecified vitamin D deficiency    Past Surgical History:  Procedure Laterality Date  . INGUINAL HERNIA REPAIR    . VIDEO ASSISTED THORACOSCOPY (VATS)/EMPYEMA Right 04/30/2015   Procedure: VIDEO ASSISTED THORACOSCOPY (VATS)/DRAIN EMPYEMA;   Surgeon: Ivin Poot, MD;  Location: [facility];  Service: Thoracic;  Laterality: Right;  [name] VIDEO BRONCHOSCOPY N/A 04/30/2015   Procedure: VIDEO BRONCHOSCOPY;  Surgeon: Ivin Poot, MD;  Location: [facility] OR;  Service: Thoracic;  Laterality: N/A;   Social History:   reports that he quit smoking about 50 years ago. He has never used smokeless tobacco. He reports current alcohol use. He reports current drug use. Drug: Marijuana.  Family History  Problem Relation Age of Onset  . Diabetes Mother   . Hypertension Mother   . Diabetes Father   . Hypertension Father   . Kidney disease Father   . Diabetes Sister   . Kidney disease Brother     Medications: Patient's Medications  New Prescriptions   No medications on file  Previous Medications   ALLOPURINOL (ZYLOPRIM) 100 MG TABLET    Take 2 tablets (200 mg total) by mouth daily.   LINAGLIPTIN (TRADJENTA) 5 MG TABS TABLET    Take 1 tablet (5 mg total) by mouth daily.   LOSARTAN (COZAAR) 100 MG TABLET    TAKE 1 TABLET(100 MG) BY MOUTH DAILY   MEMANTINE HCL-DONEPEZIL HCL (NAMZARIC) 28-10 MG CP24    Take 1 capsule by mouth daily.   METFORMIN (GLUCOPHAGE) 500 MG TABLET    TAKE 1 TABLET BY MOUTH EVERY DAY WITH BREAKFAST   MULTIPLE VITAMIN (MULTIVITAMIN WITH MINERALS) TABS TABLET    Take 1 tablet by mouth daily.   POTASSIUM CHLORIDE SA (KLOR-CON) 20 MEQ TABLET    See admin instructions. Take 60 meq total (3 tabs ) by mouth 2 times a day for 2 days and then continue taking 60 meq (3 tabs) by mouth daily.  Modified Medications   No medications on file  Discontinued Medications   POTASSIUM CHLORIDE SA (KLOR-CON) 20 MEQ TABLET    Take 3 tablets (60 mEq total) by mouth daily.    Physical Exam:  Vitals:   11/20/19 1521  Weight: 177 lb (80.3 kg)  Height: 6\' 1"  (1.854 m)   Body mass index is 23.35 kg/m. Wt Readings from Last 3 Encounters:  11/20/19 177 lb (80.3 kg)  10/06/19 177 lb 6.4 oz (80.5 kg)  06/10/19 187 lb (84.8 kg)      Labs  reviewed: Basic Metabolic Panel: Recent Labs    06/10/19 1502 10/06/19 1541 11/19/19 0919  NA 142 142 142  K 3.5 3.1* 2.9*  CL 108 107 105  CO2 28 21 27   GLUCOSE 107 96 204*  BUN 7 11 9   CREATININE 0.97 1.17 1.12  CALCIUM 9.3 9.0 9.4   Liver Function Tests: Recent Labs    06/10/19 1502 10/06/19 1541 11/19/19 0919  AST 24 91* 82*  ALT 15 53* 47*  BILITOT 0.6 1.1 1.0  PROT 6.3 6.5 6.9   No results for input(s): LIPASE, AMYLASE in the last 8760 hours. No results  for input(s): AMMONIA in the last 8760 hours. CBC: Recent Labs    03/04/19 1033  WBC 4.2  NEUTROABS 2,394  HGB 13.3  HCT 38.0*  MCV 92.2  PLT 175   Lipid Panel: Recent Labs    03/04/19 1033 11/19/19 0919  CHOL 192 168  HDL 80 102  LDLCALC 75 42  TRIG 279* 162*  CHOLHDL 2.4 1.6   TSH: No results for input(s): TSH in the last 8760 hours. A1C: Lab Results  Component Value Date   HGBA1C 6.8 (H) 10/06/2019     Assessment/Plan 1. Hypokalemia -pt only taking potassium 20 meq daily instead of 60 meq daily.  -son gave him additional 3 tablets this afternoon.  -son reports there is no way to get him to take tablets twice daily -pt will take 20 meq 3 tablets to equal 60 meq daily and come into office in 4 days (due to long weekend) for recheck   2. Vascular dementia without behavioral disturbance (HCC) -significantly worse, son reports he is drinking more alcohol and appears more depressed. He is working on getting him more help with VA benefits. Again reinforced that pt should NOT be driving and son may be the one that has to help implement this by taking away keys and/or cars. Will also submit paperwork through the [facility].   3. ETOH abuse -son reports he has drank in excess for year and when he takes the trash weekly at least 1 1.75L of liquor, 2 bottles in the trash can they took yesterday. There is 1 already in trash can now- son is at his house.  Recommended in patient alcohol treatment plan.  Son  does not feel like he would go willingly, will refer to [facility] to help with inpatient treatment and son also talking to [place]  4. Depression Worsen since COVID, very isolated and drinking more alcohol -son is working on getting benefits for pt through the [place] but also recommended inpatient treatment  Next appt: lab on 11/24/2019 Shanda Bumps K. Biagio Borg  [facility] [phone]    Virtual Visit via Telephone Note  I connected with pt and son on 11/20/19 at  3:45 PM EST by telephone and verified that I am speaking with the correct person using two identifiers.  Location: Patient: home Provider: [facility]   I discussed the limitations, risks, security and privacy concerns of performing an evaluation and management service by telephone and the availability of in person appointments. I also discussed with the patient that there may be a patient responsible charge related to this service. The patient expressed understanding and agreed to proceed.   I discussed the assessment and treatment plan with the patient. The patient was provided an opportunity to ask questions and all were answered. The patient agreed with the plan and demonstrated an understanding of the instructions.   The patient was advised to call back or seek an in-person evaluation if the symptoms worsen or if the condition fails to improve as anticipated.  I provided  30 minutes of non-face-to-face time during this encounter.  Janene Harvey. Biagio Borg Avs printed and mailed

## 2019-11-20 NOTE — Patient Instructions (Signed)
To look into inpatient treatment plans (behavioral health or the [place]) or outpatient for alcohol withdrawal programs : [facility] Address: [address], [place], [place] [address] Phone: [phone]  To help withdrawal safely from alcohol and drugs

## 2019-11-24 ENCOUNTER — Other Ambulatory Visit: Payer: Self-pay

## 2019-11-24 ENCOUNTER — Other Ambulatory Visit: Payer: Medicare Other

## 2019-11-24 ENCOUNTER — Telehealth: Payer: Self-pay

## 2019-11-24 DIAGNOSIS — E1140 Type 2 diabetes mellitus with diabetic neuropathy, unspecified: Secondary | ICD-10-CM

## 2019-11-24 DIAGNOSIS — R748 Abnormal levels of other serum enzymes: Secondary | ICD-10-CM

## 2019-11-24 DIAGNOSIS — E876 Hypokalemia: Secondary | ICD-10-CM

## 2019-11-24 NOTE — Telephone Encounter (Signed)
Patient was in office today for labs and in passing his son asked if he could get a referral to the podiatrist.

## 2019-11-24 NOTE — Telephone Encounter (Signed)
Referral placed. Please notify son that [facility] was unable to take on pts case as they are not in network. Another option is home health with SW

## 2019-11-24 NOTE — Telephone Encounter (Signed)
Noted  

## 2019-11-24 NOTE — Telephone Encounter (Signed)
Marcie from [employer] called and stated that patient Gary Mcneil had different address and that she needed a DOB to make sure she had the right patient. DOB cofirmed over the phone with [facility].

## 2019-11-25 ENCOUNTER — Ambulatory Visit: Payer: Self-pay | Admitting: Nurse Practitioner

## 2019-11-25 NOTE — Telephone Encounter (Signed)
Left detailed message on voicemail for patients son with Jessica's reply. I instructed Mr.Storts, Jr. To return call if he is interested in [facility] referral with social worker.

## 2019-11-26 ENCOUNTER — Other Ambulatory Visit: Payer: Self-pay

## 2019-11-26 ENCOUNTER — Ambulatory Visit: Payer: Self-pay | Admitting: Nurse Practitioner

## 2019-11-26 DIAGNOSIS — E876 Hypokalemia: Secondary | ICD-10-CM

## 2019-11-26 NOTE — Addendum Note (Signed)
Addended by: Maurice Small on: 11/26/2019 02:28 PM   Modules accepted: Orders

## 2019-11-26 NOTE — Telephone Encounter (Signed)
Gary Mcneil also inquired again if Shanda Bumps will provide a letter noting patients diagnosis of depression and ETOH abuse/use. I placed [name] on hold and had a verbal conversation with Shanda Bumps and she verbalized that she would compose a letter.  Gary Mcneil would like a call when letter is available for pick up.

## 2019-11-26 NOTE — Telephone Encounter (Signed)
Spoke with Arthur Holms and he would like a referral to [employer] for social worker

## 2019-11-27 NOTE — Addendum Note (Signed)
Addended by: [name] on: 11/27/2019 12:59 PM   Modules accepted: Orders

## 2019-11-27 NOTE — Telephone Encounter (Signed)
Letter completed however he did not have a face to face visit so I will not be able to do a home health referral. If we can do a virtual visit or see him in office I will be able to make this referral and recommend doing this.

## 2019-11-28 ENCOUNTER — Other Ambulatory Visit: Payer: Self-pay

## 2019-11-28 ENCOUNTER — Telehealth (INDEPENDENT_AMBULATORY_CARE_PROVIDER_SITE_OTHER): Payer: Medicare Other | Admitting: Nurse Practitioner

## 2019-11-28 ENCOUNTER — Encounter: Payer: Self-pay | Admitting: Nurse Practitioner

## 2019-11-28 DIAGNOSIS — F1010 Alcohol abuse, uncomplicated: Secondary | ICD-10-CM

## 2019-11-28 DIAGNOSIS — Z9114 Patient's other noncompliance with medication regimen: Secondary | ICD-10-CM

## 2019-11-28 DIAGNOSIS — E876 Hypokalemia: Secondary | ICD-10-CM | POA: Diagnosis not present

## 2019-11-28 DIAGNOSIS — F0150 Vascular dementia without behavioral disturbance: Secondary | ICD-10-CM

## 2019-11-28 LAB — PAIN MGMT, PROFILE 1 W/O CONF, U
Amphetamines: NEGATIVE ng/mL
Barbiturates: NEGATIVE ng/mL
Benzodiazepines: NEGATIVE ng/mL
Cocaine Metabolite: NEGATIVE ng/mL
Creatinine: >300 mg/dL
Marijuana Metabolite: NEGATIVE ng/mL
Methadone Metabolite: NEGATIVE ng/mL
Opiates: NEGATIVE ng/mL
Oxidant: NEGATIVE ug/mL
Oxycodone: NEGATIVE ng/mL
Phencyclidine: NEGATIVE ng/mL
pH: 5.4 (ref 4.5–9.0)

## 2019-11-28 LAB — BASIC METABOLIC PANEL WITH GFR
BUN: 7 mg/dL (ref 7–25)
CO2: 23 mmol/L (ref 20–32)
Calcium: 9.1 mg/dL (ref 8.6–10.3)
Chloride: 107 mmol/L (ref 98–110)
Creat: 1.11 mg/dL (ref 0.70–1.18)
GFR, Est African American: 74 mL/min/{1.73_m2} (ref >=60)
GFR, Est Non African American: 64 mL/min/{1.73_m2} (ref >=60)
Glucose, Bld: 151 mg/dL — ABNORMAL HIGH (ref 65–139)
Potassium: 3.8 mmol/L (ref 3.5–5.3)
Sodium: 141 mmol/L (ref 135–146)

## 2019-11-28 LAB — TEST AUTHORIZATION 2

## 2019-11-28 LAB — PAIN MGMT, ALCOHOL MET. QN, UR
ETG: 28161 ng/mL
ETS: 10531 ng/mL

## 2019-11-28 NOTE — Progress Notes (Signed)
Careteam: Patient Care Team: [name], NP as PCP - General (Nurse Practitioner)  Advanced Directive information    Allergies  Allergen Reactions  . Penicillins Rash    Chief Complaint  Patient presents with  . Face-to-Face    Home health referral request.      HPI: Patient is a 79 y.o. male, meeting via virtual visit for home health referral. Does not meet requirements for [facility] due to insurance. Son needs more assistance.   Hypokalemia- Potassium level had improved to normal range with son giving pt potassium 20 meq 3 tablets in the morning. Has upcoming lab appt scheduled to ensure labs remain stable.   Pt with worsening memory loss. He was doing well with dementia until COVID and now things are not going well.  Son needs help in the next steps. He knows he needs more supervision. He is a drinks ETOH and it has gotten worse. 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  Review of Systems:  Review of Systems  Constitutional: Negative for chills, fever and weight loss.  Respiratory: Negative for shortness of breath.   Cardiovascular: Negative for chest pain, palpitations and leg swelling.  Gastrointestinal: Negative for constipation and heartburn.  Genitourinary: Negative for frequency.  Musculoskeletal: Negative for falls and myalgias.  Skin: Negative.   Neurological: Negative for dizziness and headaches.  Psychiatric/Behavioral: Positive for depression (per son) and memory loss. The patient is not nervous/anxious and does not have insomnia.     Past Medical History:  Diagnosis Date  . "walking corpse" syndrome   . Acute kidney failure, unspecified (HCC)   . Acute kidney failure, unspecified (HCC)   . Anemia, unspecified   . Arthritis   . Dementia   . Dementia (HCC) 04/24/2012  . Disorder of bone and cartilage, unspecified   . Dizziness and giddiness   . GERD (gastroesophageal reflux disease)   . Gout   . Gout, unspecified   .  Hypertension   . Hypopotassemia   . Hypotension, unspecified   . Hypotension, unspecified   . Memory loss   . Memory loss   . Muscle weakness (generalized)   . Myalgia and myositis, unspecified   . Neuromuscular disorder (HCC)   . Other and unspecified hyperlipidemia   . Screening for ischemic heart disease   . Tobacco use disorder   . Type II or unspecified type diabetes mellitus without mention of complication, uncontrolled   . Type II or unspecified type diabetes mellitus without mention of complication, uncontrolled   . Unspecified hereditary and idiopathic peripheral neuropathy   . Unspecified vitamin D deficiency    Past Surgical History:  Procedure Laterality Date  . INGUINAL HERNIA REPAIR    . VIDEO ASSISTED THORACOSCOPY (VATS)/EMPYEMA Right 04/30/2015   Procedure: VIDEO ASSISTED THORACOSCOPY (VATS)/DRAIN EMPYEMA;  Surgeon: [name], MD;  Location: [facility] OR;  Service: Thoracic;  Laterality: Right;  [name] VIDEO BRONCHOSCOPY N/A 04/30/2015   Procedure: VIDEO BRONCHOSCOPY;  Surgeon: [name], MD;  Location: [facility] OR;  Service: Thoracic;  Laterality: N/A;   Social History:   reports that he quit smoking about 50 years ago. He has never used smokeless tobacco. He reports current alcohol use. He reports current drug use. Drug: Marijuana.  Family History  Problem Relation Age of Onset  . Diabetes Mother   . Hypertension Mother   . Diabetes Father   . Hypertension Father   . Kidney disease Father   .  Diabetes Sister   . Kidney disease Brother     Medications: Patient's Medications  New Prescriptions   No medications on file  Previous Medications   ALLOPURINOL (ZYLOPRIM) 100 MG TABLET    Take 2 tablets (200 mg total) by mouth daily.   LINAGLIPTIN (TRADJENTA) 5 MG TABS TABLET    Take 1 tablet (5 mg total) by mouth daily.   LOSARTAN (COZAAR) 100 MG TABLET    TAKE 1 TABLET(100 MG) BY MOUTH DAILY   MEMANTINE HCL-DONEPEZIL HCL (NAMZARIC) 28-10 MG CP24    Take 1 capsule by  mouth daily.   METFORMIN (GLUCOPHAGE) 500 MG TABLET    TAKE 1 TABLET BY MOUTH EVERY DAY WITH BREAKFAST   MULTIPLE VITAMIN (MULTIVITAMIN WITH MINERALS) TABS TABLET    Take 1 tablet by mouth daily.   POTASSIUM CHLORIDE SA (KLOR-CON) 20 MEQ TABLET    Take 3 tablets (60 mEq total) by mouth daily.  Modified Medications   No medications on file  Discontinued Medications   No medications on file    Physical Exam:  There were no vitals filed for this visit. There is no height or weight on file to calculate BMI. Wt Readings from Last 3 Encounters:  11/20/19 177 lb (80.3 kg)  10/06/19 177 lb 6.4 oz (80.5 kg)  06/10/19 187 lb (84.8 kg)     Labs reviewed: Basic Metabolic Panel: Recent Labs    10/06/19 1541 11/19/19 0919 11/24/19 1333  NA 142 142 141  K 3.1* 2.9* 3.8  CL 107 105 107  CO2 21 27 23   GLUCOSE 96 204* 151*  BUN 11 9 7   CREATININE 1.17 1.12 1.11  CALCIUM 9.0 9.4 9.1   Liver Function Tests: Recent Labs    06/10/19 1502 10/06/19 1541 11/19/19 0919  AST 24 91* 82*  ALT 15 53* 47*  BILITOT 0.6 1.1 1.0  PROT 6.3 6.5 6.9   No results for input(s): LIPASE, AMYLASE in the last 8760 hours. No results for input(s): AMMONIA in the last 8760 hours. CBC: Recent Labs    03/04/19 1033  WBC 4.2  NEUTROABS 2,394  HGB 13.3  HCT 38.0*  MCV 92.2  PLT 175   Lipid Panel: Recent Labs    03/04/19 1033 11/19/19 0919  CHOL 192 168  HDL 80 102  LDLCALC 75 42  TRIG 279* 162*  CHOLHDL 2.4 1.6   TSH: No results for input(s): TSH in the last 8760 hours. A1C: Lab Results  Component Value Date   HGBA1C 6.8 (H) 10/06/2019     Assessment/Plan 1. Hypokalemia -recent lab work with corrected potassium. Pt now on potassium 20 meq 30 meq daily.  Will follow up next week to make sure potassium remains in range. - Ambulatory referral to Home Health  2. ETOH abuse -continues to drink alcohol which is worse per son due to being isolated. Encouraged assistance with alcohol  withdrawal treatment. Can be done outpatient at [facility] or inpatient if needed. - Ambulatory referral to Home Health  3. Vascular dementia without behavioral disturbance (HCC) -progressive decline. Continues to drive. Educated son and pt that he should not be driving due to memory loss, also with ETOH use. Paperwork sent to [facility] as pt should NOT be driving. - Ambulatory referral to Home Health to help son with resources and medication management.   4. Medication noncompliance due to cognitive impairment - Ambulatory referral to Home Health  Next appt: 12/04/2019 for lab Becket Wecker K. [facility]  [facility] [phone]   Virtual Visit via Video Note  I connected with Miguel Aschoff on 11/28/19 at  2:15 PM EST by a video enabled telemedicine application and verified that I am speaking with the correct person using two identifiers.  Location: Patient: home Provider: office    I discussed the limitations of evaluation and management by telemedicine and the availability of in person appointments. The patient expressed understanding and agreed to proceed.    I discussed the assessment and treatment plan with the patient. The patient was provided an opportunity to ask questions and all were answered. The patient agreed with the plan and demonstrated an understanding of the instructions.   The patient was advised to call back or seek an in-person evaluation if the symptoms worsen or if the condition fails to improve as anticipated.  I provided 15 minutes of non-face-to-face time during this encounter.  Carlos American. Dewaine Oats, AGNP Avs printed and mailed.

## 2019-11-28 NOTE — Patient Instructions (Signed)
You have been advised NOT to drive  To look into inpatient treatment plans (behavioral health or the [place]) or outpatient for alcohol withdrawal programs : [facility] Address: [address] [place], [place], [place] [address] Phone: [phone]  To help withdrawal safely from alcohol and drugs

## 2019-11-28 NOTE — Progress Notes (Signed)
   This service is provided via telemedicine  No vital signs collected/recorded due to the encounter was a telemedicine visit.   Location of patient (ex: home, work):  Home  Patient consents to a telephone visit:  Yes  Location of the provider (ex: office, home):  [employer], Office   Name of any referring provider:  N/A  Names of all persons participating in the telemedicine service and their role in the encounter:  Particia Lather, RMA, [name], NP, Enzo Bi, Montez Hageman., and Patient   Time spent on call:  6 min with medical assistant

## 2019-11-28 NOTE — Telephone Encounter (Signed)
Spoke with patients son. Letter placed at the front desk for pick-up. Scheduled appointment for today to complete face-to-face, patients son may have to reschedule after checking his work schedule

## 2019-12-01 ENCOUNTER — Telehealth: Payer: Self-pay | Admitting: *Deleted

## 2019-12-01 NOTE — Telephone Encounter (Signed)
[facility] calling with refusal from the referral that was sent to them. Per nurse they can not order social work and she would need to speak with you before she can approve the referral, pt would need another skilled need other than home health aid.

## 2019-12-02 NOTE — Telephone Encounter (Signed)
Spoke with [facility] and they just needed physician who would be signing orders. There was no issues noted and should be good to start with home health nursing.

## 2019-12-04 ENCOUNTER — Other Ambulatory Visit: Payer: Self-pay

## 2019-12-04 ENCOUNTER — Other Ambulatory Visit: Payer: Medicare Other

## 2019-12-05 DIAGNOSIS — G609 Hereditary and idiopathic neuropathy, unspecified: Secondary | ICD-10-CM

## 2019-12-05 DIAGNOSIS — I10 Essential (primary) hypertension: Secondary | ICD-10-CM

## 2019-12-05 DIAGNOSIS — M1990 Unspecified osteoarthritis, unspecified site: Secondary | ICD-10-CM

## 2019-12-05 DIAGNOSIS — M109 Gout, unspecified: Secondary | ICD-10-CM

## 2019-12-05 DIAGNOSIS — F0150 Vascular dementia without behavioral disturbance: Secondary | ICD-10-CM

## 2019-12-05 DIAGNOSIS — E119 Type 2 diabetes mellitus without complications: Secondary | ICD-10-CM

## 2019-12-05 DIAGNOSIS — J869 Pyothorax without fistula: Secondary | ICD-10-CM

## 2019-12-05 DIAGNOSIS — G709 Myoneural disorder, unspecified: Secondary | ICD-10-CM

## 2019-12-12 ENCOUNTER — Other Ambulatory Visit: Payer: Medicare Other

## 2019-12-18 ENCOUNTER — Ambulatory Visit
Admission: RE | Admit: 2019-12-18 | Discharge: 2019-12-18 | Disposition: A | Discharge status: Home / Self Care | Payer: Medicare Other | Source: Ambulatory Visit | Attending: Nurse Practitioner | Admitting: Nurse Practitioner

## 2019-12-18 ENCOUNTER — Telehealth: Payer: Self-pay | Admitting: *Deleted

## 2019-12-18 DIAGNOSIS — R748 Abnormal levels of other serum enzymes: Secondary | ICD-10-CM

## 2019-12-18 NOTE — Telephone Encounter (Signed)
Noted thank you, we also have not had Son reschedule pts lab appt- he needs to follow up BMP to follow up potassium - missed last appt

## 2019-12-18 NOTE — Telephone Encounter (Signed)
Gary Mcneil, Social Worker with [facility] called and stated that she just wanted to let you know that she has called son and Patient several times and left messages to schedule an appointment but no return call. Stated that there is a delay because son nor patient will return any calls. Stated that she just wanted to let you know and she will keep trying.

## 2019-12-25 NOTE — Telephone Encounter (Signed)
Reviewing Gary Mcneil's Inbasket I seen this encounter remains open. I called patients son  To scheduled appointment and he stated he was under the impression that no further follow-up was needed.  Gary Mcneil was working out at the time of call and plans to call back to schedule a non fasting lab appointment to recheck potassium (BMP). Order was previously placed.

## 2019-12-29 ENCOUNTER — Telehealth: Payer: Self-pay

## 2019-12-29 NOTE — Telephone Encounter (Signed)
The son called and requests to speak with you.  I explained how we usually handle messages, but he continues to state he needs to talk with you.  He stated his dad received a notice from the [facility] today regarding his driving privileges.  He stated that because his dad lives alone that he needs at least limited driving privileges.    Son said he thought you had sent something to [facility]. I see from your 11/28/19 visit that it was discussed with son/patient that he should not be driving due to memory loss, and that paperwork was sent to [facility].

## 2019-12-29 NOTE — Telephone Encounter (Signed)
Correct, he can always make appt to be seen or leave msg with the clinical staff. Thank you

## 2019-12-31 NOTE — Telephone Encounter (Signed)
Scheduled appointment for 01/02/20.

## 2020-01-02 ENCOUNTER — Other Ambulatory Visit: Payer: Self-pay

## 2020-01-02 ENCOUNTER — Ambulatory Visit: Payer: Medicare Other | Admitting: Nurse Practitioner

## 2020-01-02 ENCOUNTER — Encounter: Payer: Self-pay | Admitting: Nurse Practitioner

## 2020-01-02 ENCOUNTER — Telehealth: Payer: Medicare Other | Admitting: Nurse Practitioner

## 2020-01-02 NOTE — Telephone Encounter (Signed)
Additional message routed to [name], NP

## 2020-01-02 NOTE — Telephone Encounter (Signed)
Message routed to Eubanks, Jessica K, NP  

## 2020-01-05 ENCOUNTER — Telehealth (INDEPENDENT_AMBULATORY_CARE_PROVIDER_SITE_OTHER): Payer: Medicare Other | Admitting: Nurse Practitioner

## 2020-01-05 ENCOUNTER — Encounter: Payer: Self-pay | Admitting: Nurse Practitioner

## 2020-01-05 ENCOUNTER — Other Ambulatory Visit: Payer: Self-pay

## 2020-01-05 DIAGNOSIS — E876 Hypokalemia: Secondary | ICD-10-CM

## 2020-01-05 DIAGNOSIS — F0150 Vascular dementia without behavioral disturbance: Secondary | ICD-10-CM | POA: Diagnosis not present

## 2020-01-05 DIAGNOSIS — F1010 Alcohol abuse, uncomplicated: Secondary | ICD-10-CM | POA: Diagnosis not present

## 2020-01-05 NOTE — Progress Notes (Signed)
This service is provided via telemedicine  No vital signs collected/recorded due to the encounter was a telemedicine visit.   Location of patient (ex: home, work):  Home   Patient consents to a telephone visit:  Yes  Location of the provider (ex: office, home):  [place], [place]   Name of any referring provider:  N/A  Names of all persons participating in the telemedicine service and their role in the encounter:  S.Chrae B/CMA, Sherrie Mustache, NP, Micheline Chapman ([facility]) and Patient   Time spent on call:  6 min with medical assistant       Careteam: Patient Care Team: Lauree Chandler, NP as PCP - General (Nurse Practitioner)  Advanced Directive information    Allergies  Allergen Reactions  . Penicillins Rash    Chief Complaint  Patient presents with  . Follow-up    Patient and son would like to discuss driving privileges and forms submitted to [facility]. Telehealth/Video visit      HPI: Patient is a 79 y.o. male to discussed paperwork for DMV.  Pts son reports he feels like fine to drive and states he lives alone and needs to drive. Son states he does not know what to do. If he takes his keys he will be mad at him.  He feels like he needs driving privileges should be modified and he should be allowed to drive.  Son feels like drinking ETOH does not effect his driving. States he does not drive while he is drinking [facility]H [facility] says that he will get to assisted living in 3-6 months.  He is trying to keep his Dad out of the loop until he can confirm placement because he know it will upset him.  Son does report he got a car stuck in the mud in the yard and tried to get around the car that had broken down.   Review of Systems:  Review of Systems  Constitutional: Negative for chills, fever and malaise/fatigue.  Psychiatric/Behavioral: Positive for memory loss.    Past Medical History:  Diagnosis Date  . "walking corpse" syndrome   . Acute kidney failure,  unspecified ([place])   . Acute kidney failure, unspecified ([place])   . Anemia, unspecified   . Arthritis   . Dementia   . Dementia ([place]) 04/24/2012  . Disorder of bone and cartilage, unspecified   . Dizziness and giddiness   . GERD (gastroesophageal reflux disease)   . Gout   . Gout, unspecified   . Hypertension   . Hypopotassemia   . Hypotension, unspecified   . Hypotension, unspecified   . Memory loss   . Memory loss   . Muscle weakness (generalized)   . Myalgia and myositis, unspecified   . Neuromuscular disorder ([place])   . Other and unspecified hyperlipidemia   . Screening for ischemic heart disease   . Tobacco use disorder   . Type II or unspecified type diabetes mellitus without mention of complication, uncontrolled   . Type II or unspecified type diabetes mellitus without mention of complication, uncontrolled   . Unspecified hereditary and idiopathic peripheral neuropathy   . Unspecified vitamin D deficiency    Past Surgical History:  Procedure Laterality Date  . INGUINAL HERNIA REPAIR    . VIDEO ASSISTED THORACOSCOPY (VATS)/EMPYEMA Right 04/30/2015   Procedure: VIDEO ASSISTED THORACOSCOPY (VATS)/DRAIN EMPYEMA;  Surgeon: Ivin Poot, MD;  Location: [place];  Service: Thoracic;  Laterality: Right;  [name] VIDEO BRONCHOSCOPY N/A 04/30/2015   Procedure:  VIDEO BRONCHOSCOPY;  Surgeon: [name], MD;  Location: [facility] OR;  Service: Thoracic;  Laterality: N/A;   Social History:   reports that he quit smoking about 50 years ago. He has never used smokeless tobacco. He reports current alcohol use. He reports previous drug use. Drug: Marijuana.  Family History  Problem Relation Age of Onset  . Diabetes Mother   . Hypertension Mother   . Diabetes Father   . Hypertension Father   . Kidney disease Father   . Diabetes Sister   . Kidney disease Brother     Medications: Patient's Medications  New Prescriptions   No medications on file  Previous Medications   ALLOPURINOL (ZYLOPRIM)  100 MG TABLET    Take 2 tablets (200 mg total) by mouth daily.   LINAGLIPTIN (TRADJENTA) 5 MG TABS TABLET    Take 1 tablet (5 mg total) by mouth daily.   LOSARTAN (COZAAR) 100 MG TABLET    TAKE 1 TABLET(100 MG) BY MOUTH DAILY   MEMANTINE HCL-DONEPEZIL HCL (NAMZARIC) 28-10 MG CP24    Take 1 capsule by mouth daily.   METFORMIN (GLUCOPHAGE) 500 MG TABLET    TAKE 1 TABLET BY MOUTH EVERY DAY WITH BREAKFAST   MULTIPLE VITAMIN (MULTIVITAMIN WITH MINERALS) TABS TABLET    Take 1 tablet by mouth daily.   POTASSIUM CHLORIDE SA (KLOR-CON) 20 MEQ TABLET    Take 3 tablets (60 mEq total) by mouth daily.  Modified Medications   No medications on file  Discontinued Medications   No medications on file    Physical Exam:  There were no vitals filed for this visit. There is no height or weight on file to calculate BMI. Wt Readings from Last 3 Encounters:  11/20/19 177 lb (80.3 kg)  10/06/19 177 lb 6.4 oz (80.5 kg)  06/10/19 187 lb (84.8 kg)      Labs reviewed: Basic Metabolic Panel: Recent Labs    10/06/19 1541 11/19/19 0919 11/24/19 1333  NA 142 142 141  K 3.1* 2.9* 3.8  CL 107 105 107  CO2 21 27 23   GLUCOSE 96 204* 151*  BUN 11 9 7   CREATININE 1.17 1.12 1.11  CALCIUM 9.0 9.4 9.1   Liver Function Tests: Recent Labs    06/10/19 1502 10/06/19 1541 11/19/19 0919  AST 24 91* 82*  ALT 15 53* 47*  BILITOT 0.6 1.1 1.0  PROT 6.3 6.5 6.9   No results for input(s): LIPASE, AMYLASE in the last 8760 hours. No results for input(s): AMMONIA in the last 8760 hours. CBC: Recent Labs    03/04/19 1033  WBC 4.2  NEUTROABS 2,394  HGB 13.3  HCT 38.0*  MCV 92.2  PLT 175   Lipid Panel: Recent Labs    03/04/19 1033 11/19/19 0919  CHOL 192 168  HDL 80 102  LDLCALC 75 42  TRIG 279* 162*  CHOLHDL 2.4 1.6   TSH: No results for input(s): TSH in the last 8760 hours. A1C: Lab Results  Component Value Date   HGBA1C 6.8 (H) 10/06/2019     Assessment/Plan 1. Vascular dementia without  behavioral disturbance (HCC) -progressive decline in memory, DMV has sent paperwork to pt stating he should not drive and license will be revoked. In agreement with this due to dementia. -again educated son that he should not be driving and pt son will have to be the one to take keys away or make car un-driveable. Pt is lacking insight due to his progressive dementia.  -Discussed with  son about applying for meals on wheels, filling out paperwork for transportation ([employer]), and in home care to help with safety and meals.  -Reached out to home health agency to have SW consult and home health trying to get a SW consult already but can not get a hold of the son and no one answering the door when they go to the home.  2. ETOH abuse Ongoing, pt had lab work that indicated alcohol in his system at 1:30 pm. Home health has also witness drinking ETOH and concerned over drinking and driving.   3. Hypokalemia -encouraged to make appt for follow up lab work (overdue).   Next appt: 01/06/2020 for lab work. Janene Harvey. Biagio Borg  [facility] [phone]   Virtual Visit via Video Note  I connected with MATTEUS MCNELLY on 01/05/20 at 11:00 AM EST by a video enabled telemedicine application and verified that I am speaking with the correct person using two identifiers.  Location: Patient: home Provider: office   I discussed the limitations of evaluation and management by telemedicine and the availability of in person appointments. The patient expressed understanding and agreed to proceed.    I discussed the assessment and treatment plan with the patient. The patient was provided an opportunity to ask questions and all were answered. The patient agreed with the plan and demonstrated an understanding of the instructions.   The patient was advised to call back or seek an in-person evaluation if the symptoms worsen or if the condition fails to improve as anticipated.  I  provided 16 minutes of non-face-to-face time during this encounter.  Janene Harvey. Janyth Contes, AGNP Avs printed and mailed.

## 2020-01-06 ENCOUNTER — Other Ambulatory Visit: Payer: Medicare Other

## 2020-01-07 ENCOUNTER — Telehealth: Payer: Self-pay

## 2020-01-07 NOTE — Telephone Encounter (Signed)
Maggie with [facility] requested verbal order for speech therapy.   FYI, [facility] was finally able to get MSW evaluation. The delay was a result of trying to work around patients son work schedule.    Per [employer] standing order, verbal order given. Message will be sent to patient's provider as a FYI.

## 2020-01-09 ENCOUNTER — Telehealth: Payer: Self-pay | Admitting: *Deleted

## 2020-01-09 NOTE — Telephone Encounter (Signed)
Harvie Heck, Speech Therapist with [facility] called requesting Verbal orders for Speech Therapy 1x4wk to address cognition.  Verbal orders given.

## 2020-02-05 ENCOUNTER — Ambulatory Visit: Payer: Medicare (Managed Care) | Admitting: Nurse Practitioner

## 2020-02-05 ENCOUNTER — Other Ambulatory Visit: Payer: Self-pay

## 2020-02-05 ENCOUNTER — Encounter: Payer: Self-pay | Admitting: Nurse Practitioner

## 2020-02-05 NOTE — Progress Notes (Signed)
Subjective:   Gary Mcneil is a 79 y.o. male who presents for Medicare Annual/Subsequent preventive examination.  Review of Systems:   Cardiac Risk Factors include: advanced age (>81men, >56 women);sedentary lifestyle;hypertension;diabetes mellitus;male gender     Objective:    Vitals: There were no vitals taken for this visit.  There is no height or weight on file to calculate BMI.  Advanced Directives 02/05/2020 11/20/2019 10/06/2019 03/03/2019 01/28/2018 09/10/2017 12/26/2016  Does Patient Have a Medical Advance Directive? Yes No Yes Yes Yes Yes Yes  Type of Arts administrator Power of Attorney Living will;Healthcare Power of [place] of [place]  Does patient want to make changes to medical advance directive? No - Patient declined No - Patient declined No - Patient declined - No - Patient declined - -  Copy of [place] in Chart? Yes - validated most recent copy scanned in chart (See row information) Yes - validated most recent copy scanned in chart (See row information) Yes - validated most recent copy scanned in chart (See row information) No - copy requested Yes Yes Yes  Would patient like information on creating a medical advance directive? - No - Patient declined - - - - -  Pre-existing out of facility DNR order (yellow form or pink MOST form) - - - - - - -    Tobacco Social History   Tobacco Use  Smoking Status Former Smoker  . Quit date: 11/20/1969  . Years since quitting: 50.2  Smokeless Tobacco Never Used     Counseling given: Not Answered   Clinical Intake:  Pre-visit preparation completed: Yes  Pain : No/denies pain     BMI - recorded: 23.61 Nutritional Status: BMI of 19-24  Normal Nutritional Risks: None Diabetes: Yes  How often do you need to have someone help you when you read instructions,  pamphlets, or other written materials from your doctor or pharmacy?: 1 - Never        Past Medical History:  Diagnosis Date  . "walking corpse" syndrome   . Acute kidney failure, unspecified ([place])   . Acute kidney failure, unspecified ([place])   . Anemia, unspecified   . Arthritis   . Dementia   . Dementia ([place]) 04/24/2012  . Disorder of bone and cartilage, unspecified   . Dizziness and giddiness   . GERD (gastroesophageal reflux disease)   . Gout   . Gout, unspecified   . Hypertension   . Hypopotassemia   . Hypotension, unspecified   . Hypotension, unspecified   . Memory loss   . Memory loss   . Muscle weakness (generalized)   . Myalgia and myositis, unspecified   . Neuromuscular disorder ([place])   . Other and unspecified hyperlipidemia   . Screening for ischemic heart disease   . Tobacco use disorder   . Type II or unspecified type diabetes mellitus without mention of complication, uncontrolled   . Type II or unspecified type diabetes mellitus without mention of complication, uncontrolled   . Unspecified hereditary and idiopathic peripheral neuropathy   . Unspecified vitamin D deficiency    Past Surgical History:  Procedure Laterality Date  . INGUINAL HERNIA REPAIR    . VIDEO ASSISTED THORACOSCOPY (VATS)/EMPYEMA Right 04/30/2015   Procedure: VIDEO ASSISTED THORACOSCOPY (VATS)/DRAIN EMPYEMA;  Surgeon: Ivin Poot, MD;  Location: [facility];  Service: Thoracic;  Laterality: Right;  [name] VIDEO BRONCHOSCOPY  N/A 04/30/2015   Procedure: VIDEO BRONCHOSCOPY;  Surgeon: [name], MD;  Location: [facility] OR;  Service: Thoracic;  Laterality: N/A;   Family History  Problem Relation Age of Onset  . Diabetes Mother   . Hypertension Mother   . Diabetes Father   . Hypertension Father   . Kidney disease Father   . Diabetes Sister   . Kidney disease Brother    Social History   Socioeconomic History  . Marital status: Single    Spouse name: Not on file  . Number of children: 3  . Years  of education: Not on file  . Highest education level: Not on file  Occupational History  . Occupation: Retired Charity fundraiser from [employer]  . Occupation: Retired Education officer, environmental  Tobacco Use  . Smoking status: Former Smoker    Quit date: 11/20/1969    Years since quitting: 50.2  . Smokeless tobacco: Never Used  Substance and Sexual Activity  . Alcohol use: Yes    Comment: 2-3 times weekly, combination or beer, wine, and liquor.per son patient drinks beer and liquor heavy  . Drug use: Not Currently    Types: Marijuana  . Sexual activity: Never  Other Topics Concern  . Not on file  Social History Narrative   Bachelors in Nurse, adult in [place]   Attended [employer] and [employer]            Social Determinants of Health   Financial Resource Strain:   . Difficulty of Paying Living Expenses:   Food Insecurity:   . Worried About Programme researcher, broadcasting/film/video in the Last Year:   . Barista in the Last Year:   Transportation Needs:   . Freight forwarder (Medical):   [name] Lack of Transportation (Non-Medical):   Physical Activity:   . Days of Exercise per Week:   . Minutes of Exercise per Session:   Stress:   . Feeling of Stress :   Social Connections:   . Frequency of Communication with Friends and Family:   . Frequency of Social Gatherings with Friends and Family:   . Attends Religious Services:   . Active Member of Clubs or Organizations:   . Attends Banker Meetings:   [name] Marital Status:     Outpatient Encounter Medications as of 02/05/2020  Medication Sig  . allopurinol (ZYLOPRIM) 100 MG tablet Take 2 tablets (200 mg total) by mouth daily.  [name] atorvastatin (LIPITOR) 20 MG tablet Take 20 mg by mouth at bedtime.  [name] linagliptin (TRADJENTA) 5 MG TABS tablet Take 1 tablet (5 mg total) by mouth daily.  [name] losartan (COZAAR) 100 MG tablet TAKE 1 TABLET(100 MG) BY MOUTH DAILY  . Memantine HCl-Donepezil HCl (NAMZARIC) 28-10 MG CP24 Take 1 capsule by mouth daily.  .  metFORMIN (GLUCOPHAGE) 500 MG tablet TAKE 1 TABLET BY MOUTH EVERY DAY WITH BREAKFAST  . Multiple Vitamin (MULTIVITAMIN WITH MINERALS) TABS tablet Take 1 tablet by mouth daily.  . potassium chloride SA (KLOR-CON) 20 MEQ tablet Take 3 tablets (60 mEq total) by mouth daily.   No facility-administered encounter medications on file as of 02/05/2020.    Activities of Daily Living In your present state of health, do you have any difficulty performing the following activities: 02/05/2020  Hearing? N  Vision? N  Difficulty concentrating or making decisions? Y  Walking or climbing stairs? N  Dressing or bathing? N  Doing errands, shopping? N  Preparing Food and eating ? Jeannie Fend  Comment does not prepare food, no trouble eating  Using the Toilet? N  In the past six months, have you accidently leaked urine? N  Do you have problems with loss of bowel control? N  Managing your Medications? Y  Managing your Finances? Y  Housekeeping or managing your Housekeeping? Y  Some recent data might be hidden    Patient Care Team: [name], NP as PCP - General (Nurse Practitioner)   Assessment:   This is a routine wellness examination for Gary Mcneil.  Exercise Activities and Dietary recommendations Current Exercise Habits: The patient does not participate in regular exercise at present  Goals    . DIET - INCREASE WATER INTAKE     Increase to 5 glasses of water a day.     . Increase water intake     Starting 12/26/16, I will attempt to increase my water intake to 48 oz. Per day.        Fall Risk Fall Risk  02/05/2020 01/05/2020 11/28/2019 11/20/2019 10/06/2019  Falls in the past year? 0 0 1 1 0  Number falls in past yr: 0 0 0 0 0  Comment - - - - -  Injury with Fall? 0 0 0 0 0  Risk for fall due to : - - - - -   Is the patient's home free of loose throw rugs in walkways, pet beds, electrical cords, etc?   yes      Grab bars in the bathroom? no      Handrails on the stairs?   yes      Adequate  lighting?   yes  Timed Get Up and Go Performed: na  Depression Screen PHQ 2/9 Scores 02/05/2020 10/06/2019 06/10/2019 03/03/2019  PHQ - 2 Score 0 0 0 0    Cognitive Function MMSE - Mini Mental State Exam 01/31/2019 01/28/2018 12/26/2016 05/05/2016 02/25/2015  Orientation to time 2 1 2 2 1   Orientation to Place 4 3 4 5 3   Registration 3 3 3 3 3   Attention/ Calculation 5 5 5 5 5   Recall 3 0 2 3 0  Language- name 2 objects 2 2 2 2 2   Language- repeat 1 1 1 1 1   Language- follow 3 step command 3 3 3 3 3   Language- read & follow direction 1 1 1 1 1   Write a sentence 1 1 1 1 1   Copy design 1 1 1 1  0  Copy design-comments - - - - Unable to see clearly   Total score 26 21 25 27 20         Immunization History  Administered Date(s) Administered  . Fluad Quad(high Dose 65+) 10/06/2019  . Influenza Whole 09/20/2013  . Influenza, High Dose Seasonal PF 09/10/2017  . Influenza,inj,Quad PF,6+ Mos 09/02/2014, 01/04/2016  . Pneumococcal Conjugate-13 12/26/2016  . Pneumococcal Polysaccharide-23 04/27/2015  . Tdap 04/13/2012    Qualifies for Shingles Vaccine? Yes, recommended   Screening Tests Health Maintenance  Topic Date Due  . OPHTHALMOLOGY EXAM  12/29/2016  . HEMOGLOBIN A1C  04/04/2020  . FOOT EXAM  10/05/2020  . TETANUS/TDAP  04/13/2022  . INFLUENZA VACCINE  Completed  . PNA vac Low Risk Adult  Completed   Cancer Screenings: Lung: Low Dose CT Chest recommended if Age 32-80 years, 30 pack-year currently smoking OR have quit w/in 15years. Patient does not qualify. Colorectal: colo-guard recommended but could not complete.   Additional Screenings:  Hepatitis C Screening: done      Plan:  I have personally reviewed and noted the following in the patient's chart:   . Medical and social history . Use of alcohol, tobacco or illicit drugs  . Current medications and supplements . Functional ability and status . Nutritional status . Physical activity . Advanced  directives . List of other physicians . Hospitalizations, surgeries, and ER visits in previous 12 months . Vitals . Screenings to include cognitive, depression, and falls . Referrals and appointments  In addition, I have reviewed and discussed with patient certain preventive protocols, quality metrics, and best practice recommendations. A written personalized care plan for preventive services as well as general preventive health recommendations were provided to patient.     [name], NP  02/05/2020

## 2020-02-05 NOTE — Patient Instructions (Signed)
Gary Mcneil , Thank you for taking time to come for your Medicare Wellness Visit. I appreciate your ongoing commitment to your health goals. Please review the following plan we discussed and let me know if I can assist you in the future.   Screening recommendations/referrals: Colonoscopy- aged out Recommended yearly ophthalmology/optometry visit for glaucoma screening and checkup Recommended yearly dental visit for hygiene and checkup  Vaccinations: Influenza vaccine done Pneumococcal vaccine done  Tdap vaccine done Shingles vaccine yes, recommended to get at [facility] pharmacy   Advanced directives: on file.   Conditions/risks identified: progressive memory loss  Next appointment: 1 years.   Preventive Care 36 Years and Older, Male Preventive care refers to lifestyle choices and visits with your health care provider that can promote health and wellness. What does preventive care include?  A yearly physical exam. This is also called an annual well check.  Dental exams once or twice a year.  Routine eye exams. Ask your health care provider how often you should have your eyes checked.  Personal lifestyle choices, including:  Daily care of your teeth and gums.  Regular physical activity.  Eating a healthy diet.  Avoiding tobacco and drug use.  Limiting alcohol use.  Practicing safe sex.  Taking low doses of aspirin every day.  Taking vitamin and mineral supplements as recommended by your health care provider. What happens during an annual well check? The services and screenings done by your health care provider during your annual well check will depend on your age, overall health, lifestyle risk factors, and family history of disease. Counseling  Your health care provider may ask you questions about your:  Alcohol use.  Tobacco use.  Drug use.  Emotional well-being.  Home and relationship well-being.  Sexual activity.  Eating habits.  History of  falls.  Memory and ability to understand (cognition).  Work and work Astronomer. Screening  You may have the following tests or measurements:  Height, weight, and BMI.  Blood pressure.  Lipid and cholesterol levels. These may be checked every 5 years, or more frequently if you are over 80 years old.  Skin check.  Lung cancer screening. You may have this screening every year starting at age 35 if you have a 30-pack-year history of smoking and currently smoke or have quit within the past 15 years.  Fecal occult blood test (FOBT) of the stool. You may have this test every year starting at age 28.  Flexible sigmoidoscopy or colonoscopy. You may have a sigmoidoscopy every 5 years or a colonoscopy every 10 years starting at age 53.  Prostate cancer screening. Recommendations will vary depending on your family history and other risks.  Hepatitis C blood test.  Hepatitis B blood test.  Sexually transmitted disease (STD) testing.  Diabetes screening. This is done by checking your blood sugar (glucose) after you have not eaten for a while (fasting). You may have this done every 1-3 years.  Abdominal aortic aneurysm (AAA) screening. You may need this if you are a current or former smoker.  Osteoporosis. You may be screened starting at age 37 if you are at high risk. Talk with your health care provider about your test results, treatment options, and if necessary, the need for more tests. Vaccines  Your health care provider may recommend certain vaccines, such as:  Influenza vaccine. This is recommended every year.  Tetanus, diphtheria, and acellular pertussis (Tdap, Td) vaccine. You may need a Td booster every 10 years.  Zoster vaccine. You may  need this after age 1.  Pneumococcal 13-valent conjugate (PCV13) vaccine. One dose is recommended after age 38.  Pneumococcal polysaccharide (PPSV23) vaccine. One dose is recommended after age 26. Talk to your health care provider about  which screenings and vaccines you need and how often you need them. This information is not intended to replace advice given to you by your health care provider. Make sure you discuss any questions you have with your health care provider. Document Released: 12/03/2015 Document Revised: 07/26/2016 Document Reviewed: 09/07/2015 Elsevier Interactive Patient Education  2017 [employer] Prevention in the Home Falls can cause injuries. They can happen to people of all ages. There are many things you can do to make your home safe and to help prevent falls. What can I do on the outside of my home?  Regularly fix the edges of walkways and driveways and fix any cracks.  Remove anything that might make you trip as you walk through a door, such as a raised step or threshold.  Trim any bushes or trees on the path to your home.  Use bright outdoor lighting.  Clear any walking paths of anything that might make someone trip, such as rocks or tools.  Regularly check to see if handrails are loose or broken. Make sure that both sides of any steps have handrails.  Any raised decks and porches should have guardrails on the edges.  Have any leaves, snow, or ice cleared regularly.  Use sand or salt on walking paths during winter.  Clean up any spills in your garage right away. This includes oil or grease spills. What can I do in the bathroom?  Use night lights.  Install grab bars by the toilet and in the tub and shower. Do not use towel bars as grab bars.  Use non-skid mats or decals in the tub or shower.  If you need to sit down in the shower, use a plastic, non-slip stool.  Keep the floor dry. Clean up any water that spills on the floor as soon as it happens.  Remove soap buildup in the tub or shower regularly.  Attach bath mats securely with double-sided non-slip rug tape.  Do not have throw rugs and other things on the floor that can make you trip. What can I do in the  bedroom?  Use night lights.  Make sure that you have a light by your bed that is easy to reach.  Do not use any sheets or blankets that are too big for your bed. They should not hang down onto the floor.  Have a firm chair that has side arms. You can use this for support while you get dressed.  Do not have throw rugs and other things on the floor that can make you trip. What can I do in the kitchen?  Clean up any spills right away.  Avoid walking on wet floors.  Keep items that you use a lot in easy-to-reach places.  If you need to reach something above you, use a strong step stool that has a grab bar.  Keep electrical cords out of the way.  Do not use floor polish or wax that makes floors slippery. If you must use wax, use non-skid floor wax.  Do not have throw rugs and other things on the floor that can make you trip. What can I do with my stairs?  Do not leave any items on the stairs.  Make sure that there are handrails on both sides  of the stairs and use them. Fix handrails that are broken or loose. Make sure that handrails are as long as the stairways.  Check any carpeting to make sure that it is firmly attached to the stairs. Fix any carpet that is loose or worn.  Avoid having throw rugs at the top or bottom of the stairs. If you do have throw rugs, attach them to the floor with carpet tape.  Make sure that you have a light switch at the top of the stairs and the bottom of the stairs. If you do not have them, ask someone to add them for you. What else can I do to help prevent falls?  Wear shoes that:  Do not have high heels.  Have rubber bottoms.  Are comfortable and fit you well.  Are closed at the toe. Do not wear sandals.  If you use a stepladder:  Make sure that it is fully opened. Do not climb a closed stepladder.  Make sure that both sides of the stepladder are locked into place.  Ask someone to hold it for you, if possible.  Clearly mark and make  sure that you can see:  Any grab bars or handrails.  First and last steps.  Where the edge of each step is.  Use tools that help you move around (mobility aids) if they are needed. These include:  Canes.  Walkers.  Scooters.  Crutches.  Turn on the lights when you go into a dark area. Replace any light bulbs as soon as they burn out.  Set up your furniture so you have a clear path. Avoid moving your furniture around.  If any of your floors are uneven, fix them.  If there are any pets around you, be aware of where they are.  Review your medicines with your doctor. Some medicines can make you feel dizzy. This can increase your chance of falling. Ask your doctor what other things that you can do to help prevent falls. This information is not intended to replace advice given to you by your health care provider. Make sure you discuss any questions you have with your health care provider. Document Released: 09/02/2009 Document Revised: 04/13/2016 Document Reviewed: 12/11/2014 Elsevier Interactive Patient Education  2017 [employer].

## 2020-02-05 NOTE — Progress Notes (Addendum)
   This service is provided via telemedicine  No vital signs collected/recorded due to the encounter was a telemedicine visit.   Location of patient (ex: home, work):  Home  Patient consents to a telephone visit:  Yes  Location of the provider (ex: office, home): [employer]  Name of any referring provider:  N/A  Names of all persons participating in the telemedicine service and their role in the encounter:  Patient, [name], Gary Mcneil, [name], [name], NP.    Time spent on call: 8 minutes on the phone with Medical Assistant.   Please disregard this visit

## 2020-02-06 ENCOUNTER — Telehealth: Payer: Self-pay | Admitting: Nurse Practitioner

## 2020-02-06 NOTE — Telephone Encounter (Signed)
Received fax from [facility] at [facility] asking for FL-2 form to be filled out so that pt may move in ASAP. Verbally spoke with [name], NP and pt would need to be seen to discuss FL-2.  Spoke with son and appt scheduled

## 2020-02-06 NOTE — Telephone Encounter (Signed)
Spoke with Interior and spatial designer) at [facility] to get Education officer, museum order added for Mr Gary Mcneil to be asssed.  [facility] stated that pt was dc 01/29/2020, due to all goals being met. During that time, Mr Gary Mcneil ambulates well with PT, ST goals were met & social worker was out to the home 3 times & spoke with [name]).  If pt needs Education officer, museum or any other services, he would need another order & diagnosis.  Thanks, Vilinda Blanks.

## 2020-02-06 NOTE — Addendum Note (Signed)
Addended by: [name] on: 02/06/2020 10:56 AM   Modules accepted: Level of Service

## 2020-02-09 ENCOUNTER — Other Ambulatory Visit: Payer: Self-pay

## 2020-02-09 ENCOUNTER — Encounter: Payer: Self-pay | Admitting: Nurse Practitioner

## 2020-02-09 ENCOUNTER — Ambulatory Visit (INDEPENDENT_AMBULATORY_CARE_PROVIDER_SITE_OTHER): Payer: Medicare (Managed Care) | Admitting: Nurse Practitioner

## 2020-02-09 VITALS — BP 142/78 | HR 106 | Temp 96.6°F | Ht 73.0 in | Wt 175.8 lb

## 2020-02-09 DIAGNOSIS — I10 Essential (primary) hypertension: Secondary | ICD-10-CM | POA: Diagnosis not present

## 2020-02-09 DIAGNOSIS — F0150 Vascular dementia without behavioral disturbance: Secondary | ICD-10-CM

## 2020-02-09 DIAGNOSIS — E1140 Type 2 diabetes mellitus with diabetic neuropathy, unspecified: Secondary | ICD-10-CM | POA: Diagnosis not present

## 2020-02-09 DIAGNOSIS — I499 Cardiac arrhythmia, unspecified: Secondary | ICD-10-CM

## 2020-02-09 DIAGNOSIS — M25512 Pain in left shoulder: Secondary | ICD-10-CM

## 2020-02-09 DIAGNOSIS — F1010 Alcohol abuse, uncomplicated: Secondary | ICD-10-CM

## 2020-02-09 DIAGNOSIS — R748 Abnormal levels of other serum enzymes: Secondary | ICD-10-CM

## 2020-02-09 DIAGNOSIS — E876 Hypokalemia: Secondary | ICD-10-CM

## 2020-02-09 DIAGNOSIS — M109 Gout, unspecified: Secondary | ICD-10-CM

## 2020-02-09 DIAGNOSIS — Z0289 Encounter for other administrative examinations: Secondary | ICD-10-CM

## 2020-02-09 DIAGNOSIS — W19XXXA Unspecified fall, initial encounter: Secondary | ICD-10-CM

## 2020-02-09 NOTE — Patient Instructions (Addendum)
Home health- PT and SW referral place  FL2 completed--good for 30 days   [facility]- if needed to wean off alcohol and to help avoid withdrawals  Address: [address] [place], [place], [place] [address] Phone: [phone]  Will order echo for further evaluation of the heart

## 2020-02-09 NOTE — Progress Notes (Signed)
Careteam: Patient Care Team: [name], NP as PCP - General (Nurse Practitioner)  PLACE OF SERVICE:  [facility]  Advanced Directive information    Allergies  Allergen Reactions  . Penicillins Rash    Chief Complaint  Patient presents with  . Form Completion    Complete FL2, here with daughter Docia Furl. Passed clock drawing on MMSE  . Shoulder Pain    Examine left shoulder, patient c/o pain.      HPI: Patient is a 79 y.o. male for follow up  Gout- continues on allopurinol 200 mg daily, no recent gout flares  Hypokalemia- due to ETOH abuse- on potassium 20 meq 3 tablets daily   Left shoulder pain- daughter reports pt had a fall when the son was there about a month ago, originally he was fine without pain but since will complain of some discomfort to left shoulder.  Review of Systems:  Review of Systems  Constitutional: Negative for chills, fever and weight loss.  HENT: Negative for tinnitus.   Respiratory: Negative for cough, sputum production and shortness of breath.   Cardiovascular: Negative for chest pain, palpitations and leg swelling.  Gastrointestinal: Negative for abdominal pain, constipation, diarrhea and heartburn.  Genitourinary: Negative for dysuria, frequency and urgency.  Musculoskeletal: Positive for joint pain. Negative for back pain, falls and myalgias.  Skin: Negative.   Neurological: Negative for dizziness and headaches.  Psychiatric/Behavioral: Positive for memory loss. Negative for depression. The patient does not have insomnia.    Past Medical History:  Diagnosis Date  . "walking corpse" syndrome   . Acute kidney failure, unspecified (HCC)   . Acute kidney failure, unspecified (HCC)   . Anemia, unspecified   . Arthritis   . Dementia   . Dementia (HCC) 04/24/2012  . Disorder of bone and cartilage, unspecified   . Dizziness and giddiness   . GERD (gastroesophageal reflux disease)   . Gout   . Gout, unspecified   . Hypertension   .  Hypopotassemia   . Hypotension, unspecified   . Hypotension, unspecified   . Memory loss   . Memory loss   . Muscle weakness (generalized)   . Myalgia and myositis, unspecified   . Neuromuscular disorder (HCC)   . Other and unspecified hyperlipidemia   . Screening for ischemic heart disease   . Tobacco use disorder   . Type II or unspecified type diabetes mellitus without mention of complication, uncontrolled   . Type II or unspecified type diabetes mellitus without mention of complication, uncontrolled   . Unspecified hereditary and idiopathic peripheral neuropathy   . Unspecified vitamin D deficiency    Past Surgical History:  Procedure Laterality Date  . INGUINAL HERNIA REPAIR    . VIDEO ASSISTED THORACOSCOPY (VATS)/EMPYEMA Right 04/30/2015   Procedure: VIDEO ASSISTED THORACOSCOPY (VATS)/DRAIN EMPYEMA;  Surgeon: [name], MD;  Location: [facility] OR;  Service: Thoracic;  Laterality: Right;  [name] VIDEO BRONCHOSCOPY N/A 04/30/2015   Procedure: VIDEO BRONCHOSCOPY;  Surgeon: [name], MD;  Location: [facility] OR;  Service: Thoracic;  Laterality: N/A;   Social History:   reports that he quit smoking about 50 years ago. He has never used smokeless tobacco. He reports current alcohol use. He reports previous drug use. Drug: Marijuana.  Family History  Problem Relation Age of Onset  . Diabetes Mother   . Hypertension Mother   . Diabetes Father   . Hypertension Father   . Kidney disease Father   . Diabetes Sister   .  Kidney disease Brother     Medications: Patient's Medications  New Prescriptions   No medications on file  Previous Medications   ALLOPURINOL (ZYLOPRIM) 100 MG TABLET    Take 2 tablets (200 mg total) by mouth daily.   ATORVASTATIN (LIPITOR) 20 MG TABLET    Take 20 mg by mouth at bedtime.   LINAGLIPTIN (TRADJENTA) 5 MG TABS TABLET    Take 1 tablet (5 mg total) by mouth daily.   LOSARTAN (COZAAR) 100 MG TABLET    TAKE 1 TABLET(100 MG) BY MOUTH DAILY   MEMANTINE  HCL-DONEPEZIL HCL (NAMZARIC) 28-10 MG CP24    Take 1 capsule by mouth daily.   METFORMIN (GLUCOPHAGE) 500 MG TABLET    TAKE 1 TABLET BY MOUTH EVERY DAY WITH BREAKFAST   MULTIPLE VITAMIN (MULTIVITAMIN WITH MINERALS) TABS TABLET    Take 1 tablet by mouth daily.   POTASSIUM CHLORIDE SA (KLOR-CON) 20 MEQ TABLET    Take 3 tablets (60 mEq total) by mouth daily.  Modified Medications   No medications on file  Discontinued Medications   No medications on file    Physical Exam:  Vitals:   02/09/20 0913  BP: (!) 142/78  Pulse: (!) 106  Temp: (!) 96.6 F (35.9 C)  TempSrc: Temporal  SpO2: 98%  Weight: 175 lb 12.8 oz (79.7 kg)  Height: 6\' 1"  (1.854 m)   Body mass index is 23.19 kg/m. Wt Readings from Last 3 Encounters:  02/09/20 175 lb 12.8 oz (79.7 kg)  11/20/19 177 lb (80.3 kg)  10/06/19 177 lb 6.4 oz (80.5 kg)   Physical Exam Constitutional:      General: He is not in acute distress.    Appearance: He is well-developed. He is not diaphoretic.  HENT:     Head: Normocephalic and atraumatic.     Mouth/Throat:     Pharynx: No oropharyngeal exudate.  Eyes:     Conjunctiva/sclera: Conjunctivae normal.     Pupils: Pupils are equal, round, and reactive to light.  Cardiovascular:     Rate and Rhythm: Normal rate and regular rhythm.  Extrasystoles are present.    Heart sounds: Normal heart sounds.  Pulmonary:     Effort: Pulmonary effort is normal.     Breath sounds: Normal breath sounds.  Abdominal:     General: Bowel sounds are normal.     Palpations: Abdomen is soft.  Musculoskeletal:     Left shoulder: Tenderness present. No swelling or deformity. Decreased range of motion. Normal strength.       Arms:     Cervical back: Normal range of motion and neck supple.  Skin:    General: Skin is warm and dry.  Neurological:     Mental Status: He is alert.  Psychiatric:        Mood and Affect: Mood normal.     Labs reviewed: Basic Metabolic Panel: Recent Labs     10/06/19 1541 11/19/19 0919 11/24/19 1333  NA 142 142 141  K 3.1* 2.9* 3.8  CL 107 105 107  CO2 21 27 23   GLUCOSE 96 204* 151*  BUN 11 9 7   CREATININE 1.17 1.12 1.11  CALCIUM 9.0 9.4 9.1   Liver Function Tests: Recent Labs    06/10/19 1502 10/06/19 1541 11/19/19 0919  AST 24 91* 82*  ALT 15 53* 47*  BILITOT 0.6 1.1 1.0  PROT 6.3 6.5 6.9   No results for input(s): LIPASE, AMYLASE in the last 8760 hours. No results for input(s):  AMMONIA in the last 8760 hours. CBC: Recent Labs    03/04/19 1033  WBC 4.2  NEUTROABS 2,394  HGB 13.3  HCT 38.0*  MCV 92.2  PLT 175   Lipid Panel: Recent Labs    03/04/19 1033 11/19/19 0919  CHOL 192 168  HDL 80 102  LDLCALC 75 42  TRIG 279* 162*  CHOLHDL 2.4 1.6   TSH: No results for input(s): TSH in the last 8760 hours. A1C: Lab Results  Component Value Date   HGBA1C 6.8 (H) 10/06/2019     Assessment/Plan 1. Vascular dementia without behavioral disturbance ([place]) Ongoing since 2013, slow decline, overall stable since he has been on aricept and namenda. Continues to drive however ongoing education provided today to patient and daughter. MMSE 23/30 today. Family looking into AL placement. FL2 and paperwork completed. SW consult also paced to help family.   2. Type 2 diabetes mellitus with diabetic neuropathy, without long-term current use of insulin ([place]) -continues on metformin, no hypoglycemia noted.  -Encouraged dietary compliance, routine foot care/monitoring and to keep up with diabetic eye exams through ophthalmology  - Hemoglobin A1c  3. Elevated liver enzymes -encouraged cessation from ETOH - COMPLETE METABOLIC PANEL WITH GFR  4. Gout, unspecified cause, unspecified chronicity, unspecified site -no recent flares, continues with allopurinol.   5. Hypokalemia -did not come in for recheck for potassium after last lab, will recheck today Confirmed he is taking potassium 60 meq daily  - COMPLETE METABOLIC PANEL WITH  GFR  6. Essential hypertension -elevated at visit today however has not taken blood pressure medication today.  - CBC with Differential/Platelet - COMPLETE METABOLIC PANEL WITH GFR  7. Encounter for completion of form with patient -completed FL2 and form for assisted living.   8. Irregular heart beat -elevated HR with extrasystoles noted during exam EKG 12-Lead done and reviewed with Dr Mariea Clonts in office. Noted with sinus tachycardia rate 104 right bundle branch block and evidence of hypertrophy will follow up Echo due to hx along with ETOH abuse.  - ECHOCARDIOGRAM COMPLETE; Future  9. ETOH abuse -encouraged cessation, discussed with daughter that assisted living needs to be aware of his ETOH use and recommended assistance in cessation. Number provided for [facility]  10. Acute pain of left shoulder -after fall. normal Strength bilaterally but with limited ROM. Recommended to ice TID but will also get home health PT - Ambulatory referral to [facility]  11. Fall -fall over a month ago, son was at home, there was no originally pain but then started to have tenderness to left shoulder which has been ongoing. Home health referral placed.   Next appt: 3 months, sooner if needed  Gary Valade K. [place], [facility] [phone]

## 2020-02-10 LAB — HEMOGLOBIN A1C
Hgb A1c MFr Bld: 6.9 % of total Hgb — ABNORMAL HIGH (ref <5.7)
Mean Plasma Glucose: 151 (calc)
eAG (mmol/L): 8.4 (calc)

## 2020-02-10 LAB — CBC WITH DIFFERENTIAL/PLATELET
Absolute Monocytes: 578 cells/uL (ref 200–950)
Basophils Absolute: 70 cells/uL (ref 0–200)
Basophils Relative: 1.3 %
Eosinophils Absolute: 81 cells/uL (ref 15–500)
Eosinophils Relative: 1.5 %
HCT: 42.2 % (ref 38.5–50.0)
Hemoglobin: 14.9 g/dL (ref 13.2–17.1)
Lymphs Abs: 1388 cells/uL (ref 850–3900)
MCH: 33.6 pg — ABNORMAL HIGH (ref 27.0–33.0)
MCHC: 35.3 g/dL (ref 32.0–36.0)
MCV: 95.3 fL (ref 80.0–100.0)
MPV: 9.8 fL (ref 7.5–12.5)
Monocytes Relative: 10.7 %
Neutro Abs: 3283 cells/uL (ref 1500–7800)
Neutrophils Relative %: 60.8 %
Platelets: 138 10*3/uL — ABNORMAL LOW (ref 140–400)
RBC: 4.43 10*6/uL (ref 4.20–5.80)
RDW: 13.9 % (ref 11.0–15.0)
Total Lymphocyte: 25.7 %
WBC: 5.4 10*3/uL (ref 3.8–10.8)

## 2020-02-10 LAB — COMPLETE METABOLIC PANEL WITH GFR
AG Ratio: 2.1 (calc) (ref 1.0–2.5)
ALT: 21 U/L (ref 9–46)
AST: 43 U/L — ABNORMAL HIGH (ref 10–35)
Albumin: 4.4 g/dL (ref 3.6–5.1)
Alkaline phosphatase (APISO): 66 U/L (ref 35–144)
BUN: 8 mg/dL (ref 7–25)
CO2: 23 mmol/L (ref 20–32)
Calcium: 8.9 mg/dL (ref 8.6–10.3)
Chloride: 104 mmol/L (ref 98–110)
Creat: 1.14 mg/dL (ref 0.70–1.18)
GFR, Est African American: 72 mL/min/{1.73_m2} (ref >=60)
GFR, Est Non African American: 62 mL/min/{1.73_m2} (ref >=60)
Globulin: 2.1 g/dL (calc) (ref 1.9–3.7)
Glucose, Bld: 177 mg/dL — ABNORMAL HIGH (ref 65–99)
Potassium: 3.4 mmol/L — ABNORMAL LOW (ref 3.5–5.3)
Sodium: 141 mmol/L (ref 135–146)
Total Bilirubin: 1.8 mg/dL — ABNORMAL HIGH (ref 0.2–1.2)
Total Protein: 6.5 g/dL (ref 6.1–8.1)

## 2020-02-12 ENCOUNTER — Telehealth: Payer: Self-pay | Admitting: Nurse Practitioner

## 2020-02-12 NOTE — Telephone Encounter (Signed)
Received call from Maggie(mgr [facility]), she stated they are refusing to take on Mr Zapf again.  They are at capacity for non mcr payers, however bc of the following below are the reasons they will not take pt for future referrals: Non-compliant(noted pt arriving 1 day from driving with alcohol) Non compliance of safety recommendations Pts son refuses to take pts car keys after being consulted about unsafe driving Due to son's job as IT sales professional, he is unable to check in as much as needed Pt missed previous multi visits due to:  Wouldn't answer the door or he was out driving  when they arrived   Wouldn't take calls to confirm appts  Maggie at [facility] can be reached if needed [phone]  Thanks, Rosezella [place]

## 2020-02-12 NOTE — Telephone Encounter (Signed)
Noted, can we consult another home health agency at this time? If he is unable to get home health can notify family and possible do outpatient PT referral for shoulder pain.  Thank you for your help in this matter.

## 2020-02-12 NOTE — Telephone Encounter (Signed)
[facility] are at capacity with non mcr payers. Have ask that we can check back for availibity Mon 02/16/20/LM

## 2020-02-26 ENCOUNTER — Encounter (HOSPITAL_COMMUNITY): Payer: Self-pay

## 2020-02-26 ENCOUNTER — Ambulatory Visit (HOSPITAL_COMMUNITY): Payer: Medicare (Managed Care) | Attending: Nurse Practitioner

## 2020-02-26 ENCOUNTER — Other Ambulatory Visit: Payer: Self-pay

## 2020-02-26 DIAGNOSIS — I499 Cardiac arrhythmia, unspecified: Secondary | ICD-10-CM

## 2020-02-26 MED ORDER — PERFLUTREN LIPID MICROSPHERE
1.0000 mL | INTRAVENOUS | Status: AC | PRN
  Administered 2020-02-26: 2 mL via INTRAVENOUS

## 2020-02-26 NOTE — Progress Notes (Signed)
Gary Mcneil presented for an echocardiogram this morning referred by [name], NP. Echo showed a severely decreased EF (25-30%), which was a change from his previous echo performed in 2016. Patient does not have any symptoms. DOD (Dr. Katrinka Blazing) was notified of the finding and advised the patient is stable to be discharged due to lack of symptoms.   Dewitt Hoes, RDCS

## 2020-02-27 ENCOUNTER — Other Ambulatory Visit: Payer: Self-pay | Admitting: Nurse Practitioner

## 2020-02-27 DIAGNOSIS — I519 Heart disease, unspecified: Secondary | ICD-10-CM

## 2020-02-27 DIAGNOSIS — I5189 Other ill-defined heart diseases: Secondary | ICD-10-CM

## 2020-03-18 ENCOUNTER — Ambulatory Visit: Payer: Medicare (Managed Care) | Admitting: Cardiology

## 2020-03-18 NOTE — Progress Notes (Deleted)
Cardiology Office Note:    Date:  03/18/2020   ID:  Gary Mcneil, DOB [birth year], MRN [id number]  PCP:  [name], NP  Cardiologist:  No primary care provider on file.  Electrophysiologist:  None   Referring MD: [name], NP   No chief complaint on file. ***  History of Present Illness:    Gary Mcneil is a 79 y.o. male with a hx of alcohol abuse, gout, dementia, GERD, hypertension, and type 2 diabetes who is referred by [name], NP for evaluation of systolic heart failure.  He was seen by his PCP last month and noted to have a regular heartbeat.  TTE was done on 02/26/2020, which showed LVEF 25 to 30%, global hypokinesis, grade 1 diastolic dysfunction, mild RV dysfunction, no significant valvular disease, IVC small/collapsible.  Past Medical History:  Diagnosis Date  . "walking corpse" syndrome   . Acute kidney failure, unspecified (HCC)   . Acute kidney failure, unspecified (HCC)   . Anemia, unspecified   . Arthritis   . Dementia   . Dementia (HCC) 04/24/2012  . Disorder of bone and cartilage, unspecified   . Dizziness and giddiness   . GERD (gastroesophageal reflux disease)   . Gout   . Gout, unspecified   . Hypertension   . Hypopotassemia   . Hypotension, unspecified   . Hypotension, unspecified   . Memory loss   . Memory loss   . Muscle weakness (generalized)   . Myalgia and myositis, unspecified   . Neuromuscular disorder (HCC)   . Other and unspecified hyperlipidemia   . Screening for ischemic heart disease   . Tobacco use disorder   . Type II or unspecified type diabetes mellitus without mention of complication, uncontrolled   . Type II or unspecified type diabetes mellitus without mention of complication, uncontrolled   . Unspecified hereditary and idiopathic peripheral neuropathy   . Unspecified vitamin D deficiency     Past Surgical History:  Procedure Laterality Date  . INGUINAL HERNIA REPAIR    . VIDEO ASSISTED THORACOSCOPY  (VATS)/EMPYEMA Right 04/30/2015   Procedure: VIDEO ASSISTED THORACOSCOPY (VATS)/DRAIN EMPYEMA;  Surgeon: [name], MD;  Location: [facility] OR;  Service: Thoracic;  Laterality: Right;  [name] VIDEO BRONCHOSCOPY N/A 04/30/2015   Procedure: VIDEO BRONCHOSCOPY;  Surgeon: [name], MD;  Location: [facility] OR;  Service: Thoracic;  Laterality: N/A;    Current Medications: No outpatient medications have been marked as taking for the 03/18/20 encounter (Appointment) with [name], MD.     Allergies:   Penicillins   Social History   Socioeconomic History  . Marital status: Single    Spouse name: Not on file  . Number of children: 3  . Years of education: Not on file  . Highest education level: Not on file  Occupational History  . Occupation: Retired Charity fundraiser from [employer]  . Occupation: Retired Education officer, environmental  Tobacco Use  . Smoking status: Former Smoker    Quit date: 11/20/1969    Years since quitting: 50.3  . Smokeless tobacco: Never Used  Substance and Sexual Activity  . Alcohol use: Yes    Comment: daily- to every other day, combination or beer, wine, and liquor.per son patient drinks beer and liquor heavy  . Drug use: Not Currently    Types: Marijuana  . Sexual activity: Never  Other Topics Concern  . Not on file  Social History Narrative   Bachelors in Nurse, adult in [place]  Attended [employer] and [employer]            Social Determinants of Health   Financial Resource Strain:   . Difficulty of Paying Living Expenses:   Food Insecurity:   . Worried About Charity fundraiser in the Last Year:   . Arboriculturist in the Last Year:   Transportation Needs:   . Film/video editor (Medical):   [name] Lack of Transportation (Non-Medical):   Physical Activity:   . Days of Exercise per Week:   . Minutes of Exercise per Session:   Stress:   . Feeling of Stress :   Social Connections:   . Frequency of Communication with Friends and Family:   . Frequency of  Social Gatherings with Friends and Family:   . Attends Religious Services:   . Active Member of Clubs or Organizations:   . Attends Archivist Meetings:   [name] Marital Status:      Family History: The patient's ***family history includes Diabetes in his father, mother, and sister; Hypertension in his father and mother; Kidney disease in his brother and father.  ROS:   Please see the history of present illness.    *** All other systems reviewed and are negative.  EKGs/Labs/Other Studies Reviewed:    The following studies were reviewed today: ***  EKG:  EKG is *** ordered today.  The ekg ordered today demonstrates ***  TTE 02/26/20: 1. Left ventricular ejection fraction, by estimation, is 25 to 30%. The  left ventricle has severely decreased function. This is a significant  change from prior echo. The left ventricle demonstrates global  hypokinesis. The left ventricular internal  cavity size was mildly dilated. Left ventricular diastolic parameters are  consistent with Grade I diastolic dysfunction (impaired relaxation).  2. Right ventricular systolic function is mildly reduced. The right  ventricular size is normal. There is mildly elevated pulmonary artery  systolic pressure. The estimated right ventricular systolic pressure is  62.9 mmHg.  3. The mitral valve is normal in structure. No evidence of mitral valve  regurgitation. No evidence of mitral stenosis.  4. The aortic valve is tricuspid. Aortic valve regurgitation is trivial.  Mild aortic valve sclerosis is present, with no evidence of aortic valve  stenosis.  5. The inferior vena cava is normal in size with greater than 50%  respiratory variability, suggesting right atrial pressure of 3 mmHg.   Recent Labs: 02/09/2020: ALT 21; BUN 8; Creat 1.14; Hemoglobin 14.9; Platelets 138; Potassium 3.4; Sodium 141  Recent Lipid Panel    Component Value Date/Time   CHOL 168 11/19/2019 0919   CHOL 188 05/05/2016 1554    TRIG 162 (H) 11/19/2019 0919   HDL 102 11/19/2019 0919   HDL 62 05/05/2016 1554   CHOLHDL 1.6 11/19/2019 0919   VLDL 20 04/26/2015 0259   LDLCALC 42 11/19/2019 0919    Physical Exam:    VS:  There were no vitals taken for this visit.    Wt Readings from Last 3 Encounters:  02/09/20 175 lb 12.8 oz (79.7 kg)  11/20/19 177 lb (80.3 kg)  10/06/19 177 lb 6.4 oz (80.5 kg)     GEN: *** Well nourished, well developed in no acute distress HEENT: Normal NECK: No JVD; No carotid bruits LYMPHATICS: No lymphadenopathy CARDIAC: ***RRR, no murmurs, rubs, gallops RESPIRATORY:  Clear to auscultation without rales, wheezing or rhonchi  ABDOMEN: Soft, non-tender, non-distended MUSCULOSKELETAL:  No edema; No deformity  SKIN: Warm and dry  NEUROLOGIC:  Alert and oriented x 3 PSYCHIATRIC:  Normal affect   ASSESSMENT:    No diagnosis found. PLAN:    In order of problems listed above:  Acute combined systolic and diastolic heart failure: TTE 02/26/2020 showed LVEF 25 to 30%.  New diagnosis. -Continue losartan 100 mg daily -Start beta-blocker -?  Start Aldactone -RHC/LHC for further evaluation  Hypertension: On losartan 100 mg daily  Type 2 diabetes: On Metformin and Tradjenta  Hyperlipidemia: On atorvastatin 20 mg daily  Hypokalemia: Takes 60 mEq potassium chloride  RTC in***  Medication Adjustments/Labs and Tests Ordered: Current medicines are reviewed at length with the patient today.  Concerns regarding medicines are outlined above.  No orders of the defined types were placed in this encounter.  No orders of the defined types were placed in this encounter.   There are no Patient Instructions on file for this visit.   Signed, [name], MD  03/18/2020 12:06 AM    [facility]

## 2020-03-30 ENCOUNTER — Encounter: Payer: Self-pay | Admitting: Nurse Practitioner

## 2020-06-04 ENCOUNTER — Ambulatory Visit: Payer: Medicare Other | Admitting: Podiatry

## 2020-06-16 ENCOUNTER — Ambulatory Visit: Payer: Medicare Other | Admitting: Podiatry

## 2020-06-16 ENCOUNTER — Other Ambulatory Visit: Payer: Self-pay

## 2020-06-16 DIAGNOSIS — B351 Tinea unguium: Secondary | ICD-10-CM

## 2020-06-16 DIAGNOSIS — M79674 Pain in right toe(s): Secondary | ICD-10-CM | POA: Diagnosis not present

## 2020-06-16 DIAGNOSIS — M79675 Pain in left toe(s): Secondary | ICD-10-CM

## 2020-06-16 DIAGNOSIS — E1140 Type 2 diabetes mellitus with diabetic neuropathy, unspecified: Secondary | ICD-10-CM

## 2020-06-17 ENCOUNTER — Encounter: Payer: Self-pay | Admitting: Podiatry

## 2020-06-17 NOTE — Progress Notes (Signed)
  Subjective:  Patient ID: Gary Mcneil, male    DOB: 01/01/1941,  MRN: [id number]  Chief Complaint  Patient presents with  . Nail Problem    pt is here for a nail trim   79 y.o. male returns for the above complaint.  Patient presents with thickened elongated dystrophic toenails x10.  Patient states is painful to touch.  Patient would like to have them debrided down.  Patient is a type II diabetic with last A1c of 6.9.  She has not been able to care for herself.  She denies any other acute complaints.  She has not seen anyone else prior to seeing me.  Objective:  There were no vitals filed for this visit. Podiatric Exam: Vascular: dorsalis pedis and posterior tibial pulses are palpable bilateral. Capillary return is immediate. Temperature gradient is WNL. Skin turgor WNL  Sensorium: Normal Semmes Weinstein monofilament test. Normal tactile sensation bilaterally. Nail Exam: Pt has thick disfigured discolored nails with subungual debris noted bilateral entire nail hallux through fifth toenails.  Pain on palpation to the nails. Ulcer Exam: There is no evidence of ulcer or pre-ulcerative changes or infection. Orthopedic Exam: Muscle tone and strength are WNL. No limitations in general ROM. No crepitus or effusions noted. HAV  B/L.  Hammer toes 2-5  B/L. Skin: No Porokeratosis. No infection or ulcers    Assessment & Plan:  No diagnosis found.  Patient was evaluated and treated and all questions answered.  Onychomycosis with pain  -Nails palliatively debrided as below. -Educated on self-care  Procedure: Nail Debridement Rationale: pain  Type of Debridement: manual, sharp debridement. Instrumentation: Nail nipper, rotary burr. Number of Nails: 10  Procedures and Treatment: Consent by patient was obtained for treatment procedures. The patient understood the discussion of treatment and procedures well. All questions were answered thoroughly reviewed. Debridement of mycotic and  hypertrophic toenails, 1 through 5 bilateral and clearing of subungual debris. No ulceration, no infection noted.  Return Visit-Office Procedure: Patient instructed to return to the office for a follow up visit 3 months for continued evaluation and treatment.  Nicholes Rough, DPM    No follow-ups on file.

## 2020-07-27 ENCOUNTER — Other Ambulatory Visit: Payer: Self-pay | Admitting: Internal Medicine

## 2020-07-27 DIAGNOSIS — Z136 Encounter for screening for cardiovascular disorders: Secondary | ICD-10-CM

## 2020-08-02 ENCOUNTER — Ambulatory Visit: Payer: Medicare (Managed Care)

## 2020-08-05 ENCOUNTER — Ambulatory Visit
Admission: RE | Admit: 2020-08-05 | Discharge: 2020-08-05 | Disposition: A | Discharge status: Home / Self Care | Payer: Medicare (Managed Care) | Source: Ambulatory Visit | Attending: Internal Medicine | Admitting: Internal Medicine

## 2020-08-05 DIAGNOSIS — Z136 Encounter for screening for cardiovascular disorders: Secondary | ICD-10-CM

## 2020-08-16 ENCOUNTER — Other Ambulatory Visit: Payer: Self-pay | Admitting: Nurse Practitioner

## 2020-09-08 ENCOUNTER — Other Ambulatory Visit: Payer: Self-pay | Admitting: Internal Medicine

## 2020-09-08 DIAGNOSIS — R102 Pelvic and perineal pain: Secondary | ICD-10-CM

## 2020-09-08 DIAGNOSIS — R109 Unspecified abdominal pain: Secondary | ICD-10-CM

## 2020-09-24 ENCOUNTER — Ambulatory Visit: Payer: Medicare Other | Admitting: Podiatry

## 2020-09-24 ENCOUNTER — Other Ambulatory Visit: Payer: Self-pay

## 2020-09-24 ENCOUNTER — Encounter: Payer: Self-pay | Admitting: Podiatry

## 2020-09-24 DIAGNOSIS — E1140 Type 2 diabetes mellitus with diabetic neuropathy, unspecified: Secondary | ICD-10-CM | POA: Diagnosis not present

## 2020-09-24 DIAGNOSIS — B351 Tinea unguium: Secondary | ICD-10-CM

## 2020-09-24 DIAGNOSIS — L853 Xerosis cutis: Secondary | ICD-10-CM | POA: Diagnosis not present

## 2020-09-24 DIAGNOSIS — M79674 Pain in right toe(s): Secondary | ICD-10-CM

## 2020-09-24 DIAGNOSIS — M79675 Pain in left toe(s): Secondary | ICD-10-CM | POA: Diagnosis not present

## 2020-09-24 NOTE — Progress Notes (Signed)
  Subjective:  Patient ID: Gary Mcneil, male    DOB: 17-Jul-1941,  MRN: [id number]  Chief Complaint  Patient presents with  . routine foot care    nail trim    79 y.o. male returns for the above complaint.  Patient presents with thickened elongated dystrophic toenails x10.  Patient states is painful to touch.  Patient would like to have them debrided down.  Patient is a type II diabetic with last A1c of 6.9.  She has not been able to care for herself.  She denies any other acute complaints.  She has not seen anyone else prior to seeing me.  Patient secondary complaint of xerosis back to bilateral lower extremity.  He would like to know if there is any over-the-counter treatment medication.  He does not use lotion.  Objective:  There were no vitals filed for this visit. Podiatric Exam: Vascular: dorsalis pedis and posterior tibial pulses are palpable bilateral. Capillary return is immediate. Temperature gradient is WNL. Skin turgor WNL  Sensorium: Normal Semmes Weinstein monofilament test. Normal tactile sensation bilaterally. Nail Exam: Pt has thick disfigured discolored nails with subungual debris noted bilateral entire nail hallux through fifth toenails.  Pain on palpation to the nails. Ulcer Exam: There is no evidence of ulcer or pre-ulcerative changes or infection. Orthopedic Exam: Muscle tone and strength are WNL. No limitations in general ROM. No crepitus or effusions noted. HAV  B/L.  Hammer toes 2-5  B/L. Skin: No Porokeratosis. No infection or ulcers.  Xerosis bilateral dry skin moderate    Assessment & Plan:   1. Xerosis cutis   2. Type 2 diabetes mellitus with diabetic neuropathy, without long-term current use of insulin (HCC)   3. Pain due to onychomycosis of toenails of both feet     Patient was evaluated and treated and all questions answered.  Xerosis bilaterally moderate -I explained to the patient the etiology of xerosis and various treatment options were extensively  discussed.  I explained to the patient the importance of maintaining moisturization of the skin with application of over-the-counter lotion such as Eucerin or Luciderm.  I have asked the patient to apply this twice a day.  If unable to resolve patient will benefit from prescription lotion.   Onychomycosis with pain  -Nails palliatively debrided as below. -Educated on self-care  Procedure: Nail Debridement Rationale: pain  Type of Debridement: manual, sharp debridement. Instrumentation: Nail nipper, rotary burr. Number of Nails: 10  Procedures and Treatment: Consent by patient was obtained for treatment procedures. The patient understood the discussion of treatment and procedures well. All questions were answered thoroughly reviewed. Debridement of mycotic and hypertrophic toenails, 1 through 5 bilateral and clearing of subungual debris. No ulceration, no infection noted.  Return Visit-Office Procedure: Patient instructed to return to the office for a follow up visit 3 months for continued evaluation and treatment.  Nicholes Rough, DPM    No follow-ups on file.

## 2020-12-24 ENCOUNTER — Encounter (HOSPITAL_COMMUNITY): Payer: Self-pay

## 2020-12-24 ENCOUNTER — Inpatient Hospital Stay (HOSPITAL_COMMUNITY)
Admission: EM | Admit: 2020-12-24 | Discharge: 2020-12-30 | DRG: 177 | Disposition: A | Discharge status: Home / Self Care | Payer: Medicare Other | Attending: Internal Medicine | Admitting: Internal Medicine

## 2020-12-24 ENCOUNTER — Other Ambulatory Visit: Payer: Self-pay

## 2020-12-24 ENCOUNTER — Ambulatory Visit: Payer: Medicare Other | Admitting: Podiatry

## 2020-12-24 ENCOUNTER — Emergency Department (HOSPITAL_COMMUNITY): Payer: Medicare Other

## 2020-12-24 DIAGNOSIS — E1140 Type 2 diabetes mellitus with diabetic neuropathy, unspecified: Secondary | ICD-10-CM | POA: Diagnosis present

## 2020-12-24 DIAGNOSIS — I21A1 Myocardial infarction type 2: Secondary | ICD-10-CM | POA: Diagnosis present

## 2020-12-24 DIAGNOSIS — G609 Hereditary and idiopathic neuropathy, unspecified: Secondary | ICD-10-CM | POA: Diagnosis present

## 2020-12-24 DIAGNOSIS — K760 Fatty (change of) liver, not elsewhere classified: Secondary | ICD-10-CM | POA: Diagnosis present

## 2020-12-24 DIAGNOSIS — I10 Essential (primary) hypertension: Secondary | ICD-10-CM | POA: Diagnosis present

## 2020-12-24 DIAGNOSIS — I2609 Other pulmonary embolism with acute cor pulmonale: Secondary | ICD-10-CM | POA: Diagnosis not present

## 2020-12-24 DIAGNOSIS — E1165 Type 2 diabetes mellitus with hyperglycemia: Secondary | ICD-10-CM | POA: Diagnosis present

## 2020-12-24 DIAGNOSIS — F17200 Nicotine dependence, unspecified, uncomplicated: Secondary | ICD-10-CM | POA: Diagnosis present

## 2020-12-24 DIAGNOSIS — Z880 Allergy status to penicillin: Secondary | ICD-10-CM | POA: Diagnosis not present

## 2020-12-24 DIAGNOSIS — U071 COVID-19: Principal | ICD-10-CM | POA: Diagnosis present

## 2020-12-24 DIAGNOSIS — J9601 Acute respiratory failure with hypoxia: Secondary | ICD-10-CM | POA: Diagnosis present

## 2020-12-24 DIAGNOSIS — Z87891 Personal history of nicotine dependence: Secondary | ICD-10-CM | POA: Diagnosis not present

## 2020-12-24 DIAGNOSIS — R0902 Hypoxemia: Secondary | ICD-10-CM

## 2020-12-24 DIAGNOSIS — I4510 Unspecified right bundle-branch block: Secondary | ICD-10-CM | POA: Diagnosis present

## 2020-12-24 DIAGNOSIS — I110 Hypertensive heart disease with heart failure: Secondary | ICD-10-CM | POA: Diagnosis present

## 2020-12-24 DIAGNOSIS — Z7984 Long term (current) use of oral hypoglycemic drugs: Secondary | ICD-10-CM

## 2020-12-24 DIAGNOSIS — I5023 Acute on chronic systolic (congestive) heart failure: Secondary | ICD-10-CM | POA: Diagnosis present

## 2020-12-24 DIAGNOSIS — D649 Anemia, unspecified: Secondary | ICD-10-CM | POA: Diagnosis present

## 2020-12-24 DIAGNOSIS — I272 Pulmonary hypertension, unspecified: Secondary | ICD-10-CM | POA: Diagnosis present

## 2020-12-24 DIAGNOSIS — I2699 Other pulmonary embolism without acute cor pulmonale: Secondary | ICD-10-CM | POA: Diagnosis present

## 2020-12-24 DIAGNOSIS — Z79899 Other long term (current) drug therapy: Secondary | ICD-10-CM | POA: Diagnosis not present

## 2020-12-24 DIAGNOSIS — R0602 Shortness of breath: Secondary | ICD-10-CM

## 2020-12-24 DIAGNOSIS — F0390 Unspecified dementia without behavioral disturbance: Secondary | ICD-10-CM | POA: Diagnosis not present

## 2020-12-24 DIAGNOSIS — K219 Gastro-esophageal reflux disease without esophagitis: Secondary | ICD-10-CM | POA: Diagnosis present

## 2020-12-24 DIAGNOSIS — R0600 Dyspnea, unspecified: Secondary | ICD-10-CM

## 2020-12-24 DIAGNOSIS — Z8249 Family history of ischemic heart disease and other diseases of the circulatory system: Secondary | ICD-10-CM

## 2020-12-24 DIAGNOSIS — I214 Non-ST elevation (NSTEMI) myocardial infarction: Secondary | ICD-10-CM | POA: Diagnosis not present

## 2020-12-24 DIAGNOSIS — E876 Hypokalemia: Secondary | ICD-10-CM | POA: Diagnosis present

## 2020-12-24 DIAGNOSIS — J9801 Acute bronchospasm: Secondary | ICD-10-CM | POA: Diagnosis present

## 2020-12-24 DIAGNOSIS — N179 Acute kidney failure, unspecified: Secondary | ICD-10-CM | POA: Insufficient documentation

## 2020-12-24 DIAGNOSIS — T380X5A Adverse effect of glucocorticoids and synthetic analogues, initial encounter: Secondary | ICD-10-CM | POA: Diagnosis not present

## 2020-12-24 DIAGNOSIS — F0391 Unspecified dementia with behavioral disturbance: Secondary | ICD-10-CM | POA: Diagnosis present

## 2020-12-24 DIAGNOSIS — Z833 Family history of diabetes mellitus: Secondary | ICD-10-CM | POA: Diagnosis not present

## 2020-12-24 DIAGNOSIS — I2510 Atherosclerotic heart disease of native coronary artery without angina pectoris: Secondary | ICD-10-CM | POA: Diagnosis present

## 2020-12-24 DIAGNOSIS — J1282 Pneumonia due to coronavirus disease 2019: Secondary | ICD-10-CM | POA: Diagnosis present

## 2020-12-24 DIAGNOSIS — R7989 Other specified abnormal findings of blood chemistry: Secondary | ICD-10-CM | POA: Diagnosis not present

## 2020-12-24 DIAGNOSIS — E785 Hyperlipidemia, unspecified: Secondary | ICD-10-CM | POA: Diagnosis present

## 2020-12-24 DIAGNOSIS — R748 Abnormal levels of other serum enzymes: Secondary | ICD-10-CM | POA: Diagnosis present

## 2020-12-24 DIAGNOSIS — M109 Gout, unspecified: Secondary | ICD-10-CM | POA: Diagnosis present

## 2020-12-24 DIAGNOSIS — I5031 Acute diastolic (congestive) heart failure: Secondary | ICD-10-CM | POA: Diagnosis not present

## 2020-12-24 DIAGNOSIS — I252 Old myocardial infarction: Secondary | ICD-10-CM

## 2020-12-24 LAB — LACTIC ACID, PLASMA
Lactic Acid, Venous: 2.7 mmol/L (ref 0.5–1.9)
Lactic Acid, Venous: 1.9 mmol/L (ref 0.5–1.9)

## 2020-12-24 LAB — CBC WITH DIFFERENTIAL/PLATELET
Abs Immature Granulocytes: 0.04 10*3/uL (ref 0.00–0.07)
Basophils Absolute: 0.1 10*3/uL (ref 0.0–0.1)
Basophils Relative: 1 %
Eosinophils Absolute: 0.1 10*3/uL (ref 0.0–0.5)
Eosinophils Relative: 1 %
HCT: 44.9 % (ref 39.0–52.0)
Hemoglobin: 15.8 g/dL (ref 13.0–17.0)
Immature Granulocytes: 1 %
Lymphocytes Relative: 16 %
Lymphs Abs: 1.4 10*3/uL (ref 0.7–4.0)
MCH: 30 pg (ref 26.0–34.0)
MCHC: 35.2 g/dL (ref 30.0–36.0)
MCV: 85.2 fL (ref 80.0–100.0)
Monocytes Absolute: 0.5 10*3/uL (ref 0.1–1.0)
Monocytes Relative: 6 %
Neutro Abs: 6.3 10*3/uL (ref 1.7–7.7)
Neutrophils Relative %: 75 %
Platelets: 271 10*3/uL (ref 150–400)
RBC: 5.27 MIL/uL (ref 4.22–5.81)
RDW: 15 % (ref 11.5–15.5)
WBC: 8.3 10*3/uL (ref 4.0–10.5)
nRBC: 0 % (ref 0.0–0.2)

## 2020-12-24 LAB — I-STAT VENOUS BLOOD GAS, ED
Acid-base deficit: 2 mmol/L (ref 0.0–2.0)
Bicarbonate: 23.4 mmol/L (ref 20.0–28.0)
Calcium, Ion: 1.12 mmol/L — ABNORMAL LOW (ref 1.15–1.40)
HCT: 46 % (ref 39.0–52.0)
Hemoglobin: 15.6 g/dL (ref 13.0–17.0)
O2 Saturation: 59 %
Potassium: 3 mmol/L — ABNORMAL LOW (ref 3.5–5.1)
Sodium: 141 mmol/L (ref 135–145)
TCO2: 25 mmol/L (ref 22–32)
pCO2, Ven: 40 mmHg — ABNORMAL LOW (ref 44.0–60.0)
pH, Ven: 7.375 (ref 7.250–7.430)
pO2, Ven: 32 mmHg (ref 32.0–45.0)

## 2020-12-24 LAB — COMPREHENSIVE METABOLIC PANEL
ALT: 24 U/L (ref 0–44)
AST: 48 U/L — ABNORMAL HIGH (ref 15–41)
Albumin: 3.3 g/dL — ABNORMAL LOW (ref 3.5–5.0)
Alkaline Phosphatase: 64 U/L (ref 38–126)
Anion gap: 15 (ref 5–15)
BUN: 26 mg/dL — ABNORMAL HIGH (ref 8–23)
CO2: 20 mmol/L — ABNORMAL LOW (ref 22–32)
Calcium: 9 mg/dL (ref 8.9–10.3)
Chloride: 104 mmol/L (ref 98–111)
Creatinine, Ser: 1.13 mg/dL (ref 0.61–1.24)
GFR, Estimated: >60 mL/min (ref >60)
Glucose, Bld: 165 mg/dL — ABNORMAL HIGH (ref 70–99)
Potassium: 3.2 mmol/L — ABNORMAL LOW (ref 3.5–5.1)
Sodium: 139 mmol/L (ref 135–145)
Total Bilirubin: 1.4 mg/dL — ABNORMAL HIGH (ref 0.3–1.2)
Total Protein: 7.3 g/dL (ref 6.5–8.1)

## 2020-12-24 LAB — HEMOGLOBIN A1C
Hgb A1c MFr Bld: 7 % — ABNORMAL HIGH (ref 4.8–5.6)
Mean Plasma Glucose: 154.2 mg/dL

## 2020-12-24 LAB — CREATININE, SERUM
Creatinine, Ser: 1.05 mg/dL (ref 0.61–1.24)
GFR, Estimated: >60 mL/min (ref >60)

## 2020-12-24 LAB — D-DIMER, QUANTITATIVE: D-Dimer, Quant: >20 ug/mL-FEU — ABNORMAL HIGH (ref 0.00–0.50)

## 2020-12-24 LAB — LACTATE DEHYDROGENASE: LDH: 290 U/L — ABNORMAL HIGH (ref 98–192)

## 2020-12-24 LAB — CBC
HCT: 41.7 % (ref 39.0–52.0)
Hemoglobin: 14.7 g/dL (ref 13.0–17.0)
MCH: 29.8 pg (ref 26.0–34.0)
MCHC: 35.3 g/dL (ref 30.0–36.0)
MCV: 84.4 fL (ref 80.0–100.0)
Platelets: 233 10*3/uL (ref 150–400)
RBC: 4.94 MIL/uL (ref 4.22–5.81)
RDW: 14.9 % (ref 11.5–15.5)
WBC: 6.3 10*3/uL (ref 4.0–10.5)
nRBC: 0 % (ref 0.0–0.2)

## 2020-12-24 LAB — FIBRINOGEN: Fibrinogen: 601 mg/dL — ABNORMAL HIGH (ref 210–475)

## 2020-12-24 LAB — TROPONIN I (HIGH SENSITIVITY)
Troponin I (High Sensitivity): 59 ng/L — ABNORMAL HIGH (ref <18)
Troponin I (High Sensitivity): 102 ng/L (ref <18)
Troponin I (High Sensitivity): 164 ng/L (ref <18)

## 2020-12-24 LAB — SARS CORONAVIRUS 2 BY RT PCR (HOSPITAL ORDER, PERFORMED IN ~~LOC~~ HOSPITAL LAB): SARS Coronavirus 2: POSITIVE — AB

## 2020-12-24 LAB — FERRITIN: Ferritin: 790 ng/mL — ABNORMAL HIGH (ref 24–336)

## 2020-12-24 LAB — BRAIN NATRIURETIC PEPTIDE: B Natriuretic Peptide: 553.8 pg/mL — ABNORMAL HIGH (ref 0.0–100.0)

## 2020-12-24 LAB — C-REACTIVE PROTEIN: CRP: 14 mg/dL — ABNORMAL HIGH (ref <1.0)

## 2020-12-24 LAB — PROCALCITONIN: Procalcitonin: <0.1 ng/mL

## 2020-12-24 LAB — GLUCOSE, CAPILLARY: Glucose-Capillary: 192 mg/dL — ABNORMAL HIGH (ref 70–99)

## 2020-12-24 MED ORDER — SODIUM CHLORIDE 0.9 % IV SOLN
200.0000 mg | Freq: Once | INTRAVENOUS | Status: AC
  Administered 2020-12-24: 200 mg via INTRAVENOUS
  Filled 2020-12-24: qty 200

## 2020-12-24 MED ORDER — MEMANTINE HCL ER 28 MG PO CP24
28.0000 mg | ORAL_CAPSULE | Freq: Every day | ORAL | Status: DC
  Administered 2020-12-25 – 2020-12-29 (×5): 28 mg via ORAL
  Filled 2020-12-24 (×6): qty 1

## 2020-12-24 MED ORDER — LOSARTAN POTASSIUM 50 MG PO TABS
100.0000 mg | ORAL_TABLET | Freq: Every day | ORAL | Status: DC
  Administered 2020-12-25 – 2020-12-30 (×6): 100 mg via ORAL
  Filled 2020-12-24 (×6): qty 2

## 2020-12-24 MED ORDER — SODIUM CHLORIDE 0.9 % IV SOLN
100.0000 mg | Freq: Every day | INTRAVENOUS | Status: AC
  Administered 2020-12-25 – 2020-12-28 (×4): 100 mg via INTRAVENOUS
  Filled 2020-12-24 (×4): qty 20

## 2020-12-24 MED ORDER — ADULT MULTIVITAMIN W/MINERALS CH
1.0000 | ORAL_TABLET | Freq: Every day | ORAL | Status: DC
  Administered 2020-12-25 – 2020-12-30 (×6): 1 via ORAL
  Filled 2020-12-24 (×6): qty 1

## 2020-12-24 MED ORDER — ENOXAPARIN SODIUM 80 MG/0.8ML ~~LOC~~ SOLN
1.0000 mg/kg | Freq: Two times a day (BID) | SUBCUTANEOUS | Status: DC
  Administered 2020-12-24 – 2020-12-28 (×8): 80 mg via SUBCUTANEOUS
  Filled 2020-12-24 (×11): qty 0.8

## 2020-12-24 MED ORDER — PREDNISONE 5 MG PO TABS
50.0000 mg | ORAL_TABLET | Freq: Every day | ORAL | Status: DC
  Administered 2020-12-27: 50 mg via ORAL
  Filled 2020-12-24 (×2): qty 2

## 2020-12-24 MED ORDER — IOHEXOL 350 MG/ML SOLN
77.0000 mL | Freq: Once | INTRAVENOUS | Status: AC | PRN
  Administered 2020-12-24: 77 mL via INTRAVENOUS

## 2020-12-24 MED ORDER — DONEPEZIL HCL 10 MG PO TABS
10.0000 mg | ORAL_TABLET | Freq: Every day | ORAL | Status: DC
  Administered 2020-12-25 – 2020-12-29 (×5): 10 mg via ORAL
  Filled 2020-12-24 (×5): qty 1

## 2020-12-24 MED ORDER — DEXAMETHASONE SODIUM PHOSPHATE 10 MG/ML IJ SOLN
10.0000 mg | Freq: Once | INTRAMUSCULAR | Status: AC
  Administered 2020-12-24: 10 mg via INTRAVENOUS
  Filled 2020-12-24: qty 1

## 2020-12-24 MED ORDER — INSULIN ASPART 100 UNIT/ML ~~LOC~~ SOLN
0.0000 [IU] | Freq: Three times a day (TID) | SUBCUTANEOUS | Status: DC
  Administered 2020-12-25 (×2): 3 [IU] via SUBCUTANEOUS
  Administered 2020-12-25: 8 [IU] via SUBCUTANEOUS
  Administered 2020-12-26: 5 [IU] via SUBCUTANEOUS
  Administered 2020-12-26: 8 [IU] via SUBCUTANEOUS
  Administered 2020-12-26 – 2020-12-27 (×2): 5 [IU] via SUBCUTANEOUS
  Administered 2020-12-27: 8 [IU] via SUBCUTANEOUS
  Administered 2020-12-28: 3 [IU] via SUBCUTANEOUS
  Administered 2020-12-28 (×2): 5 [IU] via SUBCUTANEOUS
  Administered 2020-12-29 (×2): 3 [IU] via SUBCUTANEOUS
  Administered 2020-12-29: 8 [IU] via SUBCUTANEOUS
  Administered 2020-12-30: 2 [IU] via SUBCUTANEOUS
  Administered 2020-12-30: 3 [IU] via SUBCUTANEOUS

## 2020-12-24 MED ORDER — ALLOPURINOL 100 MG PO TABS
200.0000 mg | ORAL_TABLET | Freq: Every day | ORAL | Status: DC
Start: 2020-12-25 — End: 2020-12-30
  Administered 2020-12-25 – 2020-12-30 (×6): 200 mg via ORAL
  Filled 2020-12-24 (×6): qty 2

## 2020-12-24 MED ORDER — DONEPEZIL HCL 5 MG PO TABS
10.0000 mg | ORAL_TABLET | Freq: Every day | ORAL | Status: DC
Start: 2020-12-24 — End: 2020-12-24

## 2020-12-24 MED ORDER — ATORVASTATIN CALCIUM 10 MG PO TABS
20.0000 mg | ORAL_TABLET | Freq: Every day | ORAL | Status: DC
  Administered 2020-12-25: 20 mg via ORAL
  Filled 2020-12-24: qty 2

## 2020-12-24 MED ORDER — MEMANTINE HCL-DONEPEZIL HCL ER 28-10 MG PO CP24: 1.0000 | ORAL_CAPSULE | Freq: Every day | ORAL | Status: DC

## 2020-12-24 MED ORDER — ALBUTEROL SULFATE HFA 108 (90 BASE) MCG/ACT IN AERS
2.0000 | INHALATION_SPRAY | Freq: Four times a day (QID) | RESPIRATORY_TRACT | Status: DC
  Administered 2020-12-24 – 2020-12-30 (×21): 2 via RESPIRATORY_TRACT
  Filled 2020-12-24 (×2): qty 6.7

## 2020-12-24 MED ORDER — ENOXAPARIN SODIUM 80 MG/0.8ML ~~LOC~~ SOLN: 1.0000 mg/kg | Freq: Once | SUBCUTANEOUS | Status: DC

## 2020-12-24 MED ORDER — POTASSIUM CHLORIDE CRYS ER 20 MEQ PO TBCR
40.0000 meq | EXTENDED_RELEASE_TABLET | Freq: Once | ORAL | Status: AC
  Administered 2020-12-24: 40 meq via ORAL
  Filled 2020-12-24: qty 2

## 2020-12-24 MED ORDER — METHYLPREDNISOLONE SODIUM SUCC 125 MG IJ SOLR
1.0000 mg/kg | Freq: Two times a day (BID) | INTRAMUSCULAR | Status: AC
  Administered 2020-12-24 – 2020-12-27 (×6): 80 mg via INTRAVENOUS
  Filled 2020-12-24 (×6): qty 2

## 2020-12-24 NOTE — ED Notes (Signed)
Attempted report x 2 

## 2020-12-24 NOTE — ED Notes (Signed)
Informed Dr. Jodi Mourning of pt's venous blood gas results.

## 2020-12-24 NOTE — ED Notes (Signed)
Attempted report x1. 

## 2020-12-24 NOTE — ED Notes (Signed)
Date and time results received: 12/24/20 1155  Test: Covid Critical Value:positive  Name of Provider Notified: Dr. Jodi Mourning
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
## 2020-12-24 NOTE — ED Provider Notes (Addendum)
[facility] EMERGENCY DEPARTMENT Provider Note   CSN: [id number] Arrival date & time: 12/24/20  0277     History Chief Complaint  Patient presents with  . Shortness of Breath    Gary Mcneil is a 80 y.o. male.  Patient with history of tobacco use, gout, reflux, dementia, lives at home alone presents with worsening shortness of breath for the past week.  EMS found patient 78-83% on room air and was placed on nonrebreather.  Patient says gradual onset, denies fevers, minimal cough.  Patient's had Covid vaccines.  Patient denies history of lung disease or heart disease or heart failure.  Mild exertional component.  Patient denies recent surgery, blood clot history or leg edema.        Past Medical History:  Diagnosis Date  . "walking corpse" syndrome   . Acute kidney failure, unspecified (HCC)   . Acute kidney failure, unspecified (HCC)   . Anemia, unspecified   . Arthritis   . Dementia   . Dementia (HCC) 04/24/2012  . Disorder of bone and cartilage, unspecified   . Dizziness and giddiness   . GERD (gastroesophageal reflux disease)   . Gout   . Gout, unspecified   . Hypertension   . Hypopotassemia   . Hypotension, unspecified   . Hypotension, unspecified   . Memory loss   . Memory loss   . Muscle weakness (generalized)   . Myalgia and myositis, unspecified   . Neuromuscular disorder (HCC)   . Other and unspecified hyperlipidemia   . Screening for ischemic heart disease   . Tobacco use disorder   . Type II or unspecified type diabetes mellitus without mention of complication, uncontrolled   . Type II or unspecified type diabetes mellitus without mention of complication, uncontrolled   . Unspecified hereditary and idiopathic peripheral neuropathy   . Unspecified vitamin D deficiency     Patient Active Problem List   Diagnosis Date Noted  . 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 Empyema lung (HCC) 04/30/2015  . Loculated pleural effusion   .  Hyperglycemia   . Right lower lobe lung mass   . Lung mass 04/24/2015  . Lethargy   . Essential hypertension   . HLD (hyperlipidemia)   . Right-sided chest pain 04/20/2015  . Transaminitis 04/20/2015  . Positive D dimer 04/20/2015  . Pleural effusion, right 04/20/2015  . Hypokalemia 04/20/2015  . Dementia without behavioral disturbance (HCC) 09/02/2014  . Type 2 diabetes mellitus with diabetic neuropathy (HCC) 05/20/2014  . Gout 04/29/2014  . Elevated liver enzymes 01/27/2014  . Anemia 05/02/2012  . Constipation 04/24/2012  . Arthritis   . HTN (hypertension) 04/13/2012  . Hyperlipidemia 04/13/2012    Past Surgical History:  Procedure Laterality Date  . INGUINAL HERNIA REPAIR    . VIDEO ASSISTED THORACOSCOPY (VATS)/EMPYEMA Right 04/30/2015   Procedure: VIDEO ASSISTED THORACOSCOPY (VATS)/DRAIN EMPYEMA;  Surgeon: [name], MD;  Location: [facility] OR;  Service: Thoracic;  Laterality: Right;  [name] VIDEO BRONCHOSCOPY N/A 04/30/2015   Procedure: VIDEO BRONCHOSCOPY;  Surgeon: [name], MD;  Location: [facility] OR;  Service: Thoracic;  Laterality: N/A;       Family History  Problem Relation Age of Onset  . Diabetes Mother   . Hypertension Mother   . Diabetes Father   . Hypertension Father   . Kidney disease Father   . Diabetes Sister   . Kidney disease Brother     Social History   Tobacco Use  . Smoking  status: Former Smoker    Quit date: 11/20/1969    Years since quitting: 51.1  . Smokeless tobacco: Never Used  Vaping Use  . Vaping Use: Never used  Substance Use Topics  . Alcohol use: Yes    Comment: daily- to every other day, combination or beer, wine, and liquor.per son patient drinks beer and liquor heavy  . Drug use: Not Currently    Types: Marijuana    Home Medications Prior to Admission medications   Medication Sig Start Date End Date Taking? Authorizing Provider  allopurinol (ZYLOPRIM) 100 MG tablet Take 2 tablets (200 mg total) by mouth daily. 10/06/19    [name], NP  atorvastatin (LIPITOR) 20 MG tablet Take 20 mg by mouth at bedtime. 01/19/20   [provider]  ciprofloxacin (CIPRO) 500 MG tablet SMARTSIG:1 Tablet(s) By Mouth Every 12 Hours 09/03/20   [provider]  donepezil (ARICEPT) 10 MG tablet Take 10 mg by mouth at bedtime. 07/12/20   [provider]  linagliptin (TRADJENTA) 5 MG TABS tablet Take 1 tablet (5 mg total) by mouth daily. 06/10/19   [name], NP  losartan (COZAAR) 100 MG tablet TAKE 1 TABLET(100 MG) BY MOUTH DAILY 06/10/19   [name], NP  Memantine HCl-Donepezil HCl (NAMZARIC) 28-10 MG CP24 Take 1 capsule by mouth daily. 06/10/19   [name], NP  metFORMIN (GLUCOPHAGE) 500 MG tablet TAKE 1 TABLET BY MOUTH EVERY DAY WITH BREAKFAST 06/10/19   [name], NP  metroNIDAZOLE (FLAGYL) 500 MG tablet Take 500 mg by mouth every 8 (eight) hours. 09/03/20   [provider]  Multiple Vitamin (MULTIVITAMIN WITH MINERALS) TABS tablet Take 1 tablet by mouth daily.    [provider]  potassium chloride SA (KLOR-CON) 20 MEQ tablet Take 3 tablets (60 mEq total) by mouth daily. 11/20/19   [name], NP    Allergies    Penicillins  Review of Systems   Review of Systems  Constitutional: Negative for chills and fever.  HENT: Negative for congestion.   Eyes: Negative for visual disturbance.  Respiratory: Positive for shortness of breath.   Cardiovascular: Negative for chest pain.  Gastrointestinal: Negative for abdominal pain and vomiting.  Genitourinary: Negative for dysuria and flank pain.  Musculoskeletal: Negative for back pain, neck pain and neck stiffness.  Skin: Negative for rash.  Neurological: Negative for light-headedness and headaches.    Physical Exam Updated Vital Signs BP (!) 111/95   Pulse 63   Temp 97.9 F (36.6 C) (Oral)   Resp (!) 26   Ht 6' (1.829 m)   Wt 80.3 kg   SpO2 95%   BMI 24.01 kg/m   Physical Exam Vitals  and nursing note reviewed.  Constitutional:      General: He is in acute distress.     Appearance: He is well-nourished.  HENT:     Head: Normocephalic and atraumatic.  Eyes:     General:        Right eye: No discharge.        Left eye: No discharge.     Conjunctiva/sclera: Conjunctivae normal.  Neck:     Trachea: No tracheal deviation.  Cardiovascular:     Rate and Rhythm: Normal rate and regular rhythm.  Pulmonary:     Effort: Pulmonary effort is normal.     Breath sounds: Examination of the right-lower field reveals rhonchi. Examination of the left-lower field reveals rhonchi. Decreased breath sounds and rhonchi present.  Abdominal:     General: There is no distension.     Palpations: Abdomen is soft.     Tenderness: There is no abdominal tenderness. There is no guarding.  Musculoskeletal:        General: No edema.     Cervical back: Normal range of motion and neck supple.     Right lower leg: No edema.     Left lower leg: No edema.  Skin:    General: Skin is warm.     Findings: No rash.  Neurological:     General: No focal deficit present.     Mental Status: He is alert and oriented to person, place, and time.  Psychiatric:        Mood and Affect: Mood and affect normal.     ED Results / Procedures / Treatments   Labs (all labs ordered are listed, but only abnormal results are displayed) Labs Reviewed  BRAIN NATRIURETIC PEPTIDE - Abnormal; Notable for the following components:      Result Value   B Natriuretic Peptide 553.8 (*)    All other components within normal limits  I-STAT VENOUS BLOOD GAS, ED - Abnormal; Notable for the following components:   pCO2, Ven 40.0 (*)    Potassium 3.0 (*)    Calcium, Ion 1.12 (*)    All other components within normal limits  SARS CORONAVIRUS 2 BY RT PCR (HOSPITAL ORDER, PERFORMED IN [facility])  CBC WITH DIFFERENTIAL/PLATELET  COMPREHENSIVE METABOLIC PANEL  LACTIC ACID, PLASMA  LACTIC ACID, PLASMA  TROPONIN  I (HIGH SENSITIVITY)    EKG EKG Interpretation  Date/Time:  Friday December 24 2020 09:17:23 EST Ventricular Rate:  114 PR Interval:  142 QRS Duration: 148 QT Interval:  376 QTC Calculation: 518 R Axis:   -117 Text Interpretation: Sinus tachycardia with Premature atrial complexes with Abberant conduction Right bundle branch block Abnormal ECG Confirmed by Blane Ohara [phone]) on 12/24/2020 10:46:25 AM   Radiology DG Chest Portable 1 View  Result Date: 12/24/2020 CLINICAL DATA:  Shortness of breath. EXAM: PORTABLE CHEST 1 VIEW COMPARISON:  May 26, 2015. FINDINGS: The heart size and mediastinal contours are within normal limits. Both lungs are clear. No pneumothorax or pleural effusion is noted. The visualized skeletal structures are unremarkable. IMPRESSION: No active disease. Electronically Signed   By: Lupita Raider M.D.   On: 12/24/2020 09:46    Procedures .Critical Care Performed by: Blane Ohara, MD Authorized by: Blane Ohara, MD   Critical care provider statement:    Critical care time (minutes):  120   Critical care start time:  12/24/2020 10:00 AM   Critical care end time:  12/24/2020 12:00 PM   Critical care time was exclusive of:  Separately billable procedures and treating other patients and teaching time   Critical care was necessary to treat or prevent imminent or life-threatening deterioration of the following conditions:  Respiratory failure   Critical care was time spent personally by me on the following activities:  Discussions with consultants, evaluation of patient's response to treatment, examination of patient, ordering and performing treatments and interventions, ordering and review of laboratory studies, ordering and review of radiographic studies, pulse oximetry, re-evaluation of patient's condition, obtaining history from patient or surrogate and review of old charts     Medications Ordered in ED Medications - No data to display  ED Course  I have  reviewed the triage vital signs and the nursing notes.  Pertinent labs & imaging results  that were available during my care of the patient were reviewed by me and considered in my medical decision making (see chart for details).    MDM Rules/Calculators/A&P                          Patient presents in respiratory distress requiring nonrebreather for EMS and continued on arrival.  Patient has rales at the bases, work of breathing improved with nonrebreather however drops to the 80s without.  Differential diagnosis includes new heart failure/pulmonary edema however no history of this, no leg edema the patient does have rales, other differentials including infectious/COVID, pleural effusion, ptx, pulmonary embolism, other.  Plan for blood work to check for anemia, kidney function, troponin.  Chest x-ray reviewed no acute abnormalities.  CT scan pending.  Plan to try to wean the nasal cannula EKG reviewed showing sinus tachycardia with right bundle branch block.  On reassessment patient improved with high flow nasal cannula, work of breathing normal at this time and oxygen normal.  Plan to titrate as needed.  BNP elevated greater than 500 reviewed, negative troponin.  Covid test returned positive.  Discussed likely Covid pneumonia however with significant hypoxia plan for CT look for blood clots as well.  Updated family on plan of care.  Discussed with hospitalist for admission. IV Decadron ordered. Discussed with radiology acute pulmonary emboli on CT as well.  Discussed with pharmacy and admitting doctor plan for Lovenox.  Gary Mcneil was evaluated in Emergency Department on 12/24/2020 for the symptoms described in the history of present illness. He was evaluated in the context of the global COVID-19 pandemic, which necessitated consideration that the patient might be at risk for infection with the SARS-CoV-2 virus that causes COVID-19. Institutional protocols and algorithms that pertain to the  evaluation of patients at risk for COVID-19 are in a state of rapid change based on information released by regulatory bodies including the CDC and federal and state organizations. These policies and algorithms were followed during the patient's care in the ED.   Clinical Impression(s) / ED Diagnoses Final diagnoses:  Acute dyspnea  Hypoxia    Rx / DC Orders ED Discharge Orders    None       Blane Ohara, MD 12/24/20 1250    Blane Ohara, MD 12/24/20 1348

## 2020-12-24 NOTE — ED Notes (Signed)
Patient noted sitting outside of room in a chair, alert and oriented and SOB, patient stated he did not know where he was going. Attempted to stand patient up however patient was too out of breath to walk and stated I need a w/c. Patent placed in w/c and transport to room than to bed. Patient SpO2 75% on RA. Patient placed on 15L heated highflow SpO2 87%. Patient then placed on NRB also @15L   SpO2 96. Patient educated that he could not get up unassisted and was shown his call bell. Verbalized understanding.

## 2020-12-24 NOTE — ED Notes (Signed)
Date and time results received: 12/24/20 1105 Test: Lactic Critical Value: 2.7  Name of Provider Notified: Dr. Jodi Mourning

## 2020-12-25 ENCOUNTER — Inpatient Hospital Stay (HOSPITAL_COMMUNITY): Payer: Medicare Other

## 2020-12-25 ENCOUNTER — Other Ambulatory Visit (HOSPITAL_COMMUNITY): Payer: Medicare Other

## 2020-12-25 DIAGNOSIS — R7989 Other specified abnormal findings of blood chemistry: Secondary | ICD-10-CM

## 2020-12-25 DIAGNOSIS — I10 Essential (primary) hypertension: Secondary | ICD-10-CM | POA: Diagnosis not present

## 2020-12-25 DIAGNOSIS — I5031 Acute diastolic (congestive) heart failure: Secondary | ICD-10-CM

## 2020-12-25 DIAGNOSIS — U071 COVID-19: Secondary | ICD-10-CM | POA: Diagnosis not present

## 2020-12-25 DIAGNOSIS — F0390 Unspecified dementia without behavioral disturbance: Secondary | ICD-10-CM | POA: Diagnosis not present

## 2020-12-25 DIAGNOSIS — I2699 Other pulmonary embolism without acute cor pulmonale: Secondary | ICD-10-CM

## 2020-12-25 LAB — COMPREHENSIVE METABOLIC PANEL
ALT: 22 U/L (ref 0–44)
ALT: 25 U/L (ref 0–44)
AST: 33 U/L (ref 15–41)
AST: 47 U/L — ABNORMAL HIGH (ref 15–41)
Albumin: 2.8 g/dL — ABNORMAL LOW (ref 3.5–5.0)
Albumin: 3 g/dL — ABNORMAL LOW (ref 3.5–5.0)
Alkaline Phosphatase: 60 U/L (ref 38–126)
Alkaline Phosphatase: 57 U/L (ref 38–126)
Anion gap: 15 (ref 5–15)
Anion gap: 16 — ABNORMAL HIGH (ref 5–15)
BUN: 27 mg/dL — ABNORMAL HIGH (ref 8–23)
BUN: 35 mg/dL — ABNORMAL HIGH (ref 8–23)
CO2: 21 mmol/L — ABNORMAL LOW (ref 22–32)
CO2: 19 mmol/L — ABNORMAL LOW (ref 22–32)
Calcium: 8.7 mg/dL — ABNORMAL LOW (ref 8.9–10.3)
Calcium: 8.8 mg/dL — ABNORMAL LOW (ref 8.9–10.3)
Chloride: 105 mmol/L (ref 98–111)
Chloride: 103 mmol/L (ref 98–111)
Creatinine, Ser: 1.09 mg/dL (ref 0.61–1.24)
Creatinine, Ser: 1.25 mg/dL — ABNORMAL HIGH (ref 0.61–1.24)
GFR, Estimated: >60 mL/min (ref >60)
GFR, Estimated: 59 mL/min — ABNORMAL LOW (ref >60)
Glucose, Bld: 213 mg/dL — ABNORMAL HIGH (ref 70–99)
Glucose, Bld: 261 mg/dL — ABNORMAL HIGH (ref 70–99)
Potassium: 3.7 mmol/L (ref 3.5–5.1)
Potassium: 3.6 mmol/L (ref 3.5–5.1)
Sodium: 141 mmol/L (ref 135–145)
Sodium: 138 mmol/L (ref 135–145)
Total Bilirubin: 1.1 mg/dL (ref 0.3–1.2)
Total Bilirubin: 0.7 mg/dL (ref 0.3–1.2)
Total Protein: 7.2 g/dL (ref 6.5–8.1)
Total Protein: 7 g/dL (ref 6.5–8.1)

## 2020-12-25 LAB — CBC WITH DIFFERENTIAL/PLATELET
Abs Immature Granulocytes: 0.03 10*3/uL (ref 0.00–0.07)
Basophils Absolute: 0 10*3/uL (ref 0.0–0.1)
Basophils Relative: 0 %
Eosinophils Absolute: 0 10*3/uL (ref 0.0–0.5)
Eosinophils Relative: 0 %
HCT: 43 % (ref 39.0–52.0)
Hemoglobin: 15.1 g/dL (ref 13.0–17.0)
Immature Granulocytes: 1 %
Lymphocytes Relative: 17 %
Lymphs Abs: 0.8 10*3/uL (ref 0.7–4.0)
MCH: 29.8 pg (ref 26.0–34.0)
MCHC: 35.1 g/dL (ref 30.0–36.0)
MCV: 84.8 fL (ref 80.0–100.0)
Monocytes Absolute: 0.1 10*3/uL (ref 0.1–1.0)
Monocytes Relative: 2 %
Neutro Abs: 3.7 10*3/uL (ref 1.7–7.7)
Neutrophils Relative %: 80 %
Platelets: 258 10*3/uL (ref 150–400)
RBC: 5.07 MIL/uL (ref 4.22–5.81)
RDW: 15.2 % (ref 11.5–15.5)
WBC: 4.6 10*3/uL (ref 4.0–10.5)
nRBC: 0 % (ref 0.0–0.2)

## 2020-12-25 LAB — C-REACTIVE PROTEIN: CRP: 14.5 mg/dL — ABNORMAL HIGH (ref <1.0)

## 2020-12-25 LAB — MAGNESIUM: Magnesium: 2.2 mg/dL (ref 1.7–2.4)

## 2020-12-25 LAB — GLUCOSE, CAPILLARY
Glucose-Capillary: 170 mg/dL — ABNORMAL HIGH (ref 70–99)
Glucose-Capillary: 179 mg/dL — ABNORMAL HIGH (ref 70–99)
Glucose-Capillary: 268 mg/dL — ABNORMAL HIGH (ref 70–99)
Glucose-Capillary: 167 mg/dL — ABNORMAL HIGH (ref 70–99)
Glucose-Capillary: 189 mg/dL — ABNORMAL HIGH (ref 70–99)

## 2020-12-25 LAB — PHOSPHORUS: Phosphorus: 4 mg/dL (ref 2.5–4.6)

## 2020-12-25 LAB — ECHOCARDIOGRAM COMPLETE
Height: 72 in
Weight: 2832 oz

## 2020-12-25 LAB — FERRITIN: Ferritin: 819 ng/mL — ABNORMAL HIGH (ref 24–336)

## 2020-12-25 LAB — D-DIMER, QUANTITATIVE: D-Dimer, Quant: >20 ug/mL-FEU — ABNORMAL HIGH (ref 0.00–0.50)

## 2020-12-25 MED ORDER — FOLIC ACID 1 MG PO TABS
1.0000 mg | ORAL_TABLET | Freq: Every day | ORAL | Status: DC
  Administered 2020-12-25 – 2020-12-30 (×6): 1 mg via ORAL
  Filled 2020-12-25 (×6): qty 1

## 2020-12-25 MED ORDER — GUAIFENESIN 100 MG/5ML PO SOLN
5.0000 mL | ORAL | Status: DC | PRN
  Administered 2020-12-25 – 2020-12-28 (×4): 100 mg via ORAL
  Filled 2020-12-25 (×6): qty 5

## 2020-12-25 MED ORDER — LORAZEPAM 1 MG PO TABS
1.0000 mg | ORAL_TABLET | ORAL | Status: AC | PRN
  Administered 2020-12-26: 1 mg via ORAL
  Filled 2020-12-25: qty 1

## 2020-12-25 MED ORDER — PANTOPRAZOLE SODIUM 40 MG PO TBEC
40.0000 mg | DELAYED_RELEASE_TABLET | Freq: Every day | ORAL | Status: DC
  Administered 2020-12-25 – 2020-12-30 (×6): 40 mg via ORAL
  Filled 2020-12-25 (×6): qty 1

## 2020-12-25 MED ORDER — LORAZEPAM 2 MG/ML IJ SOLN: 1.0000 mg | INTRAMUSCULAR | Status: AC | PRN

## 2020-12-25 MED ORDER — THIAMINE HCL 100 MG/ML IJ SOLN
100.0000 mg | Freq: Every day | INTRAMUSCULAR | Status: DC
  Filled 2020-12-25 (×2): qty 2

## 2020-12-25 MED ORDER — THIAMINE HCL 100 MG PO TABS
100.0000 mg | ORAL_TABLET | Freq: Every day | ORAL | Status: DC
  Administered 2020-12-25 – 2020-12-30 (×6): 100 mg via ORAL
  Filled 2020-12-25 (×6): qty 1

## 2020-12-25 MED ORDER — ADULT MULTIVITAMIN W/MINERALS CH: 1.0000 | ORAL_TABLET | Freq: Every day | ORAL | Status: DC

## 2020-12-25 NOTE — Progress Notes (Signed)
Patient refuse lab draw x 2. This nurse talk to the patient the reason for lab draw and he still said, I don't want it

## 2020-12-25 NOTE — Progress Notes (Signed)
  Echocardiogram 2D Echocardiogram has been performed.  Gary Mcneil 12/25/2020, 3:43 PM

## 2020-12-25 NOTE — Evaluation (Signed)
Physical Therapy Evaluation Patient Details Name: Gary Mcneil MRN: [id number] DOB: 10-25-1941 Today's Date: 12/25/2020   History of Present Illness  Gary Mcneil is a 80 y.o. male presenting to the emergency room with shortness of breath.  Patient reports gradual progression of shortness of breath over the past week and noted to be hypoxic (70s-80s on RA). Pt found to be COVID+, noted with PE. PMH: hypertension, diabetes, reflux, gout, dementia  Clinical Impression  Pt  Participated in PT evaluation. Pt states he was I prior to admission without use of AD (will need to confirm with family). Pt demonstrating increaesd balance deficits without use of AD requiring min-mod A during ambulation. Pt with increased HR during ambulation, 148, and becane very fatigued following ambulation. Pt will benefit from skilled PT to address deficits in balance, strength, coordination, endurance, gait and safety to maximize independence with functional mobility prior to discharge. PT recommending 24 hour S/A, if family able pt can d/c home with HHPT if not at this time for balance and gait training SNF is recommended to decrease risk of falling    Follow Up Recommendations SNF vs Home health PT;Supervision/Assistance - 24 hour; pending progress with PT and family ability to assist    Equipment Recommendations  None recommended by PT    Recommendations for Other Services       Precautions / Restrictions Precautions Precautions: Fall;Other (comment) Precaution Comments: monitor O2/HR Restrictions Weight Bearing Restrictions: No      Mobility  Bed Mobility Overal bed mobility: Needs Assistance Bed Mobility: Supine to Sit     Supine to sit: Supervision;HOB elevated     General bed mobility comments: Supervision for safety, minor cueing for sequencing    Transfers Overall transfer level: Needs assistance Equipment used: None Transfers: Sit to/from Stand Sit to Stand: Min assist Stand pivot  transfers: Min guard       General transfer comment: min guard overall for sit to stand and taking steps to recliner chair using RW, minor cues for sequencing and pacing  Ambulation/Gait Ambulation/Gait assistance: Min assist;Mod assist Gait Distance (Feet): 30 Feet Assistive device: None       General Gait Details: increased posterior lean during ambulation, increased knee extension B, increased lateral weight shift R and L during ambulation, pt stated 8/10 on modified RPE scale  Stairs            Wheelchair Mobility    Modified Rankin (Stroke Patients Only)       Balance Overall balance assessment: Needs assistance Sitting-balance support: No upper extremity supported;Feet supported Sitting balance-Leahy Scale: Fair     Standing balance support: No upper extremity supported Standing balance-Leahy Scale: Poor Standing balance comment: pt with increased weight through heels and requiring min-mod A duringambulation to maintain balance                             Pertinent Vitals/Pain Pain Assessment: No/denies pain    Home Living Family/patient expects to be discharged to:: Private residence Living Arrangements: Alone Available Help at Discharge: Family;Available PRN/intermittently Type of Home: House Home Access: Level entry     Home Layout: One level Home Equipment: Walker - 2 wheels;Cane - single point;Shower seat;Grab bars - tub/shower;Grab bars - toilet Additional Comments: reports his son and daughter in law live 4 blocks away; unsure of accuracy of home setup (pt with hx of dementia)    Prior Function Level of Independence: Independent  Gait / Transfers Assistance Needed: Pt reports typically not using AD for mobility has access to DME if he needs.  ADL's / Homemaking Assistance Needed: pt states he does everything for himself. Will need to confirm  Comments: unsure of accuracy as pt with hx of dementia     Hand Dominance   Dominant  Hand: Right    Extremity/Trunk Assessment   Upper Extremity Assessment Upper Extremity Assessment: Defer to OT evaluation    Lower Extremity Assessment Lower Extremity Assessment: Generalized weakness    Cervical / Trunk Assessment Cervical / Trunk Assessment: Kyphotic  Communication   Communication: No difficulties  Cognition Arousal/Alertness: Awake/alert Behavior During Therapy: WFL for tasks assessed/performed Overall Cognitive Status: History of cognitive impairments - at baseline Area of Impairment: Orientation;Attention;Memory;Safety/judgement;Awareness;Problem solving                 Orientation Level: Disoriented to;Situation;Place   Memory: Decreased short-term memory   Safety/Judgement: Decreased awareness of deficits;Decreased awareness of safety Awareness: Emergent Problem Solving: Slow processing;Requires verbal cues;Requires tactile cues General Comments: Pt with hx of dementia. pt states he is here because one of his children wanted him to have a check up wtih a doctor      General Comments General comments (skin integrity, edema, etc.): pt with HR increase to 148 during ambulation and O2 drop to 92%. stated 81% with poor pleth and increased to 92%    Exercises     Assessment/Plan    PT Assessment Patient needs continued PT services  PT Problem List Decreased strength;Decreased mobility;Decreased safety awareness;Decreased coordination;Decreased activity tolerance;Decreased balance       PT Treatment Interventions Therapeutic exercise;Gait training;Balance training;Functional mobility training;Therapeutic activities;Patient/family education    PT Goals (Current goals can be found in the Care Plan section)  Acute Rehab PT Goals Patient Stated Goal: TO go get my check up from my doctor PT Goal Formulation: With patient Time For Goal Achievement: 12/25/20 Potential to Achieve Goals: Good    Frequency Min 2X/week   Barriers to discharge         Co-evaluation               AM-PAC PT "6 Clicks" Mobility  Outcome Measure Help needed turning from your back to your side while in a flat bed without using bedrails?: None Help needed moving from lying on your back to sitting on the side of a flat bed without using bedrails?: None Help needed moving to and from a bed to a chair (including a wheelchair)?: A Little Help needed standing up from a chair using your arms (e.g., wheelchair or bedside chair)?: A Little Help needed to walk in hospital room?: A Lot Help needed climbing 3-5 steps with a railing? : A Lot 6 Click Score: 18    End of Session Equipment Utilized During Treatment: Gait belt;Oxygen Activity Tolerance: Patient tolerated treatment well;Patient limited by fatigue Patient left: in chair;with call bell/phone within reach Nurse Communication: Mobility status PT Visit Diagnosis: Unsteadiness on feet (R26.81);Muscle weakness (generalized) (M62.81);Other abnormalities of gait and mobility (R26.89)    Time: 1601-0932 PT Time Calculation (min) (ACUTE ONLY): 23 min   Charges:   PT Evaluation $PT Eval Low Complexity: 1 Low PT Treatments $Therapeutic Activity: 8-22 mins        Ginette Otto, DPT Acute Rehabilitation Services [phone]  Lucretia Field 12/25/2020, 1:52 PM

## 2020-12-25 NOTE — Evaluation (Signed)
Occupational Therapy Evaluation Patient Details Name: Gary Mcneil MRN: [id number] DOB: 1940-12-27 Today's Date: 12/25/2020    History of Present Illness Gary Mcneil is a 80 y.o. male presenting to the emergency room with shortness of breath.  Patient reports gradual progression of shortness of breath over the past week and noted to be hypoxic (70s-80s on RA). Pt found to be COVID+, noted with PE. PMH: hypertension, diabetes, reflux, gout, dementia   Clinical Impression   PTA, pt lives alone and reports Independence with ADLs and mobility in the home. Pt reports able to complete IADLs in the home, but son assists with tasks such as grocery shopping. Unsure of accuracy of PLOF info as pt has noted memory deficits. Pt currently able to demonstrate transfers using RW at min guard - limited in further mobility due to bowel incontinence. Pt overall Supervision for UB ADLs, up to Total A for LB ADLs including hygiene after bowel incontinence today. Pt with noted fatigue after these tasks, difficulty sitting EOB > 5 minutes. Recommend SNF for short term rehab to maximize safety and independence with ADLs/mobility. Plan to progress endurance, reinforce energy conservation strategies and maximize safety with LB ADLs in future sessions.   Pt received on 4 L O2, SpO2 >89% during session though variable pleth. HR variable 108bpm-145bpm during session though no sustained tachycardia.     Follow Up Recommendations  SNF;Supervision/Assistance - 24 hour    Equipment Recommendations  3 in 1 bedside commode    Recommendations for Other Services       Precautions / Restrictions Precautions Precautions: Fall;Other (comment) Precaution Comments: monitor O2/HR Restrictions Weight Bearing Restrictions: No      Mobility Bed Mobility Overal bed mobility: Needs Assistance Bed Mobility: Supine to Sit     Supine to sit: Supervision;HOB elevated     General bed mobility comments: Supervision for  safety, minor cueing for sequencing    Transfers Overall transfer level: Needs assistance Equipment used: Rolling walker (2 wheeled) Transfers: Sit to/from [employer] Sit to Stand: Min guard Stand pivot transfers: Min guard       General transfer comment: min guard overall for sit to stand and taking steps to recliner chair using RW, minor cues for sequencing and pacing    Balance Overall balance assessment: Needs assistance Sitting-balance support: No upper extremity supported;Feet supported Sitting balance-Leahy Scale: Fair     Standing balance support: Bilateral upper extremity supported;During functional activity Standing balance-Leahy Scale: Fair Standing balance comment: reliant on UE support during dynamic tasks                           ADL either performed or assessed with clinical judgement   ADL Overall ADL's : Needs assistance/impaired Eating/Feeding: Set up;Sitting   Grooming: Supervision/safety;Sitting   Upper Body Bathing: Supervision/ safety;Sitting   Lower Body Bathing: Moderate assistance;Sitting/lateral leans;Sit to/from stand   Upper Body Dressing : Supervision/safety;Sitting   Lower Body Dressing: Maximal assistance;Sit to/from stand;Sitting/lateral leans Lower Body Dressing Details (indicate cue type and reason): difficulty donning socks sitting EOB, leaning over and increasing fatigue sitting EOB Toilet Transfer: Min Hotel manager Details (indicate cue type and reason): simulated to recliner Toileting- Clothing Manipulation and Hygiene: Total assistance;Sit to/from stand Toileting - Clothing Manipulation Details (indicate cue type and reason): Total A for cleanup after loose bowel incontinence in bed. pt reports he wears depends at home and cleans self in bathroom if this happens at home  General ADL Comments: Pt with decreased cardiopulmonary tolerance and strength, as well as cognitive  impairments (notably memory) impacts safety with ADLs if pt to discharge home alone     Vision Patient Visual Report: No change from baseline Vision Assessment?: No apparent visual deficits     Perception     Praxis      Pertinent Vitals/Pain Pain Assessment: No/denies pain     Hand Dominance Right   Extremity/Trunk Assessment Upper Extremity Assessment Upper Extremity Assessment: Generalized weakness   Lower Extremity Assessment Lower Extremity Assessment: Defer to PT evaluation   Cervical / Trunk Assessment Cervical / Trunk Assessment: Kyphotic   Communication Communication Communication: No difficulties   Cognition Arousal/Alertness: Awake/alert Behavior During Therapy: WFL for tasks assessed/performed Overall Cognitive Status: History of cognitive impairments - at baseline Area of Impairment: Orientation;Memory;Safety/judgement;Awareness;Problem solving                 Orientation Level: Disoriented to;Situation   Memory: Decreased short-term memory   Safety/Judgement: Decreased awareness of deficits;Decreased awareness of safety Awareness: Emergent Problem Solving: Slow processing;Requires verbal cues;Requires tactile cues General Comments: Pt with hx of dementia. Appropriate in conversation though noted with memory impairments. pt reports he had not had breakfast yet - but per staff, he had already eaten breakfast this AM. Pt with improving awareness of deficits (wanted to use RW for mobility, aware of bowel incontinence)   General Comments  Pt received on 4 L O2, appeared >89% on 4 L O2 (variable pleth). Pt notably fatigued even sitting EOB noted to lean over during session. HR variable with a fib, 108bpm-145bpm inconsistently during session. Pt reports his daughter is looking into a nursing home placement for rehab before going home, but unsure about accuracy of this info    Exercises     Shoulder Instructions      Home Living Family/patient expects  to be discharged to:: Private residence Living Arrangements: Alone Available Help at Discharge: Family;Available PRN/intermittently Type of Home: House Home Access: Level entry     Home Layout: One level     Bathroom Shower/Tub: Tub/shower unit;Walk-in shower         Home Equipment: Dan Humphreys - 2 wheels;Cane - single point;Shower seat   Additional Comments: reports his son and daughter in law live 4 blocks away; unsure of accuracy of home setup (pt with hx of dementia)      Prior Functioning/Environment Level of Independence: Needs assistance  Gait / Transfers Assistance Needed: Pt reports typically not using AD for mobility has access to DME if he needs. ADL's / Homemaking Assistance Needed: Reports able to complete ADLs, some iADLs in the home. Son assists with driving and grocery shopping   Comments: unsure of accuracy as pt with hx of dementia        OT Problem List: Decreased strength;Impaired balance (sitting and/or standing);Decreased activity tolerance;Decreased cognition;Decreased knowledge of use of DME or AE;Cardiopulmonary status limiting activity      OT Treatment/Interventions: Self-care/ADL training;Therapeutic exercise;Energy conservation;DME and/or AE instruction;Therapeutic activities;Patient/family education;Balance training    OT Goals(Current goals can be found in the care plan section) Acute Rehab OT Goals Patient Stated Goal: be able to go home soon (reports he was supposed to go home yesterday) OT Goal Formulation: With patient Time For Goal Achievement: 01/08/21 Potential to Achieve Goals: Good ADL Goals Pt Will Perform Grooming: with set-up;standing Pt Will Perform Lower Body Bathing: with supervision;sitting/lateral leans;sit to/from stand Pt Will Perform Lower Body Dressing: with supervision;sitting/lateral leans;sit to/from stand Pt  Will Transfer to Toilet: with supervision;ambulating Pt Will Perform Toileting - Clothing Manipulation and hygiene:  with supervision;sit to/from stand;sitting/lateral leans Pt/caregiver will Perform Home Exercise Program: Increased strength;Both right and left upper extremity;With theraband;With Supervision;With written HEP provided Additional ADL Goal #1: Pt to demonstrate implementation of at least 2 energy conservation strategies during daily tasks  OT Frequency: Min 2X/week   Barriers to D/C:            Co-evaluation              AM-PAC OT "6 Clicks" Daily Activity     Outcome Measure Help from another person eating meals?: A Little Help from another person taking care of personal grooming?: A Little Help from another person toileting, which includes using toliet, bedpan, or urinal?: Total Help from another person bathing (including washing, rinsing, drying)?: A Lot Help from another person to put on and taking off regular upper body clothing?: A Little Help from another person to put on and taking off regular lower body clothing?: A Lot 6 Click Score: 14   End of Session Equipment Utilized During Treatment: Gait belt;Rolling walker;Oxygen Nurse Communication: Mobility status  Activity Tolerance: Patient limited by fatigue Patient left: in chair;with call bell/phone within reach;with nursing/sitter in room  OT Visit Diagnosis: Unsteadiness on feet (R26.81);Other abnormalities of gait and mobility (R26.89);Muscle weakness (generalized) (M62.81)                Time: 4562-5638 OT Time Calculation (min): 33 min Charges:  OT General Charges $OT Visit: 1 Visit OT Evaluation $OT Eval Moderate Complexity: 1 Mod OT Treatments $Self Care/Home Management : 8-22 mins  Lorre Munroe, OTR/L  Lorre Munroe 12/25/2020, 12:33 PM

## 2020-12-25 NOTE — Progress Notes (Signed)
VASCULAR LAB    Bilateral lower extremity venous duplex has been performed.  See CV proc for preliminary results.   Jacqlyn Marolf, RVT 12/25/2020, 6:33 PM

## 2020-12-25 NOTE — Progress Notes (Signed)
PROGRESS NOTE                                                                                                                                                                                                             Patient Demographics:    Gary Mcneil, is a 80 y.o. male, DOB - 10/05/1941, ZDG:[id number]  Outpatient Primary MD for the patient is Harvest ForestBakare, Mobolaji B, MD   Admit date - 12/24/2020   LOS - 1  Chief Complaint  Patient presents with  . Shortness of Breath       Brief Narrative: Patient is a 80 y.o. male with PMHx of dementia, HTN, DM-2, chronic systolic heart failure-who presented with 1 week history of shortness of breath-found to have acute hypoxic respiratory failure-due to COVID-19 pneumonia and PE and subsequently admitted to the hospitalist service.  COVID-19 vaccinated status: Vaccinated.  Significant Events: 2/5>> [facility]19 [facility]  Significant studies: 12/24/2020>> CTA chest: Segmental and subsegmental bilateral lower lobe emboli, multifocal pneumonia 02/26/2020>> Echo: EF 25-30%, right RV systolic function mildly reduced-RVSP 31.5  COVID-19 medications: Steroids: 2/4>> Remdesivir: 2/4>> Baricitinib:  Antibiotics: None  Microbiology data: None  Procedures: None  Consults: None  DVT prophylaxis: SCDs Start: 12/24/20 1354 Lovenox at therapeutic dosing    Subjective:    Gary BiKenneth Dittman today remains very pleasantly confused.  Was eating breakfast this morning-on 3-4 L of oxygen.   Assessment  & Plan :   Acute Hypoxic Resp Failure due to Covid 19 Viral pneumonia and pulmonary embolism: Remains on 3-4 L of oxygen-on steroids/Remdesivir and therapeutic anticoagulation.  If hypoxemia worsens-will need Actemra.  Watch closely.  Note-rationale/risk/benefits of Actemra use discussed with patient's son (POA) over the phone-patient has no known history of  TB/hepatitis B/diverticulitis-son consents to the use of Actemra if patient's hypoxia would worsen any further.  Fever: afebrile O2 requirements:  SpO2: 92 % (Simultaneous filing. User may not have seen previous data.) O2 Flow Rate (L/min): 5 L/min FiO2 (%): 40 %   COVID-19 Labs: Recent Labs    12/24/20 1802 12/25/20 0200  DDIMER >20.00* >20.00*  FERRITIN 790* 819*  LDH 290*  --   CRP 14.0* 14.5*       Component Value Date/Time   BNP 553.8 (H) 12/24/2020 0928    Recent Labs  Lab 12/24/20  1802  PROCALCITON <0.10    Lab Results  Component Value Date   SARSCOV2NAA POSITIVE (A) 12/24/2020     Prone/Incentive Spirometry: Confused-unlikely will be able to follow commands for incentive spirometry.  Pulmonary embolism: Provoked by COVID-19-on therapeutic Lovenox-check lower extremity Dopplers.  Await echo  Chronic systolic heart failure: Volume status stable  Pulmonary hypertension: CT chest shows enlarged pulmonary artery-prior echo with findings of pulmonary hypertension.  Await repeat echo ordered on admission  EtOH use: Family acknowledges significant alcohol use-no signs of alcohol withdrawal-start Ativan per CIWA protocol.  HTN: BP stable-continue losartan  HLD: Continue statin-watch LFTs  Gout: No flare-continue allopurinol  DM-2: CBG stable with SSI-follow  Recent Labs    12/24/20 2015 12/25/20 0639 12/25/20 0728  GLUCAP 192* 179* 170*   Dementia: Mildly confused-expect delirium during this hospital stay-maintain delirium precautions-continue Aricept/Namenda  GERD: Continue PPI  GI prophylaxis: PPI  ABG:    Component Value Date/Time   PHART 7.408 05/01/2015 0406   PCO2ART 37.3 05/01/2015 0406   PO2ART 77.0 (L) 05/01/2015 0406   HCO3 23.4 12/24/2020 0940   TCO2 25 12/24/2020 0940   ACIDBASEDEF 2.0 12/24/2020 0940   O2SAT 59.0 12/24/2020 0940    Vent Settings:  FiO2 (%):  [40 %] 40 %  Condition - Extremely Guarded-  Family Communication   :Son-Marmo Jaylun-[phone] updated over the phone   Code Status :  Full Code  Diet :  Diet Order            Diet full liquid Room service appropriate? Yes; Fluid consistency: Thin  Diet effective now                  Disposition Plan  :   Status is: Inpatient  Remains inpatient appropriate because:Inpatient level of care appropriate due to severity of illness   Dispo: The patient is from: Home              Anticipated d/c is to: Home              Anticipated d/c date is: > 3 days              Patient currently is not medically stable to d/c.   Difficult to place patient No         Barriers to discharge: Hypoxia requiring O2 supplementation/complete 5 days of IV Remdesivir  Antimicorbials  :    Anti-infectives (From admission, onward)   Start     Dose/Rate Route Frequency Ordered Stop   12/25/20 1000  remdesivir 100 mg in sodium chloride 0.9 % 100 mL IVPB       "Followed by" Linked Group Details   100 mg 200 mL/hr over 30 Minutes Intravenous Daily 12/24/20 1354 12/29/20 0959   12/24/20 1353  remdesivir 200 mg in sodium chloride 0.9% 250 mL IVPB       "Followed by" Linked Group Details   200 mg 580 mL/hr over 30 Minutes Intravenous Once 12/24/20 1354 12/24/20 1821      Inpatient Medications  Scheduled Meds: . albuterol  2 puff Inhalation Q6H  . allopurinol  200 mg Oral Daily  . atorvastatin  20 mg Oral QHS  . memantine  28 mg Oral QHS   And  . donepezil  10 mg Oral QHS  . enoxaparin (LOVENOX) injection  1 mg/kg Subcutaneous Q12H  . insulin aspart  0-15 Units Subcutaneous TID WC  . losartan  100 mg Oral Daily  . methylPREDNISolone (SOLU-MEDROL) injection  1  mg/kg Intravenous Q12H   Followed by  . [START ON 12/27/2020] predniSONE  50 mg Oral Daily  . multivitamin with minerals  1 tablet Oral Daily   Continuous Infusions: . remdesivir 100 mg in NS 100 mL 100 mg (12/25/20 0952)   PRN Meds:.[email or web address]   Time Spent in minutes  35   See all Orders  from today for further details   Jeoffrey Massed M.D on 12/25/2020 at 11:26 AM  To page go to [email or web address] - use universal password  [facility] -  Office  [phone]    Objective:   Vitals:   12/24/20 2040 12/24/20 2300 12/25/20 0617 12/25/20 0730  BP: 128/88   (!) 135/102  Pulse: 87  91 100  Resp: 19 20  18   Temp: 98 F (36.7 C)  98.6 F (37 C) 98.7 F (37.1 C)  TempSrc: Oral  Oral Oral  SpO2: 97% 98%  92%  Weight:      Height:        Wt Readings from Last 3 Encounters:  12/24/20 80.3 kg  02/09/20 79.7 kg  11/20/19 80.3 kg     Intake/Output Summary (Last 24 hours) at 12/25/2020 1126 Last data filed at 12/25/2020 0048 Gross per 24 hour  Intake --  Output 300 ml  Net -300 ml     Physical Exam Gen Exam:Alert awake-pleasantly confused-not in any distress HEENT:atraumatic, normocephalic Chest: B/L clear to auscultation anteriorly CVS:S1S2 regular Abdomen:soft non tender, non distended Extremities:no edema Neurology: Non focal Skin: no rash   Data Review:    CBC Recent Labs  Lab 12/24/20 0928 12/24/20 0940 12/24/20 1802 12/25/20 0200  WBC 8.3  --  6.3 4.6  HGB 15.8 15.6 14.7 15.1  HCT 44.9 46.0 41.7 43.0  PLT 271  --  233 258  MCV 85.2  --  84.4 84.8  MCH 30.0  --  29.8 29.8  MCHC 35.2  --  35.3 35.1  RDW 15.0  --  14.9 15.2  LYMPHSABS 1.4  --   --  0.8  MONOABS 0.5  --   --  0.1  EOSABS 0.1  --   --  0.0  BASOSABS 0.1  --   --  0.0    Chemistries  Recent Labs  Lab 12/24/20 0928 12/24/20 0940 12/24/20 1802 12/25/20 0200 12/25/20 0712  NA 139 141  --   --  141  K 3.2* 3.0*  --   --  3.7  CL 104  --   --   --  105  CO2 20*  --   --   --  21*  GLUCOSE 165*  --   --   --  213*  BUN 26*  --   --   --  27*  CREATININE 1.13  --  1.05  --  1.09  CALCIUM 9.0  --   --   --  8.7*  MG  --   --   --  2.2  --   AST 48*  --   --   --  33  ALT 24  --   --   --  22  ALKPHOS 64  --   --   --  60  BILITOT 1.4*  --   --   --  1.1    ------------------------------------------------------------------------------------------------------------------ No results for input(s): CHOL, HDL, LDLCALC, TRIG, CHOLHDL, LDLDIRECT in the last 72 hours.  Lab Results  Component Value Date   HGBA1C 7.0 (H) 12/24/2020   ------------------------------------------------------------------------------------------------------------------ No  results for input(s): TSH, T4TOTAL, T3FREE, THYROIDAB in the last 72 hours.  Invalid input(s): FREET3 ------------------------------------------------------------------------------------------------------------------ Recent Labs    12/24/20 1802 12/25/20 0200  FERRITIN 790* 819*    Coagulation profile No results for input(s): INR, PROTIME in the last 168 hours.  Recent Labs    12/24/20 1802 12/25/20 0200  DDIMER >20.00* >20.00*    Cardiac Enzymes No results for input(s): CKMB, TROPONINI, MYOGLOBIN in the last 168 hours.  Invalid input(s): CK ------------------------------------------------------------------------------------------------------------------    Component Value Date/Time   BNP 553.8 (H) 12/24/2020 5277    Micro Results Recent Results (from the past 240 hour(s))  SARS Coronavirus 2 by RT PCR (hospital order, performed in [facility] hospital lab) Nasopharyngeal Nasopharyngeal Swab     Status: Abnormal   Collection Time: 12/24/20  9:20 AM   Specimen: Nasopharyngeal Swab  Result Value Ref Range Status   SARS Coronavirus 2 POSITIVE (A) NEGATIVE Final    Comment: RESULT CALLED TO, READ BACK BY AND VERIFIED WITH: RN S.KLONKET AT 1153 ON 12/24/2020 BY T.SAAD (NOTE) SARS-CoV-2 target nucleic acids are DETECTED  SARS-CoV-2 RNA is generally detectable in upper respiratory specimens  during the acute phase of infection.  Positive results are indicative  of the presence of the identified virus, but do not rule out bacterial infection or co-infection with other pathogens  not detected by the test.  Clinical correlation with patient history and  other diagnostic information is necessary to determine patient infection status.  The expected result is negative.  Fact Sheet for Patients:   BoilerBrush.com.cy   Fact Sheet for Healthcare Providers:   https://pope.com/    This test is not yet approved or cleared by the [place] FDA and  has been authorized for detection and/or diagnosis of SARS-CoV-2 by FDA under an Emergency Use Authorization (EUA).  This EUA will remain in effect (meani ng this test can be used) for the duration of  the COVID-19 declaration under Section 564(b)(1) of the Act, 21 U.S.C. section 360-bbb-3(b)(1), unless the authorization is terminated or revoked sooner.  Performed at [facility], [address], [place], [place] [address]     Radiology Reports CT Angio Chest PE W and/or Wo Contrast  Addendum Date: 12/24/2020   ADDENDUM REPORT: 12/24/2020 13:30 ADDENDUM: Critical Value/emergent results were called by telephone at the time of interpretation on 12/24/2020 at 1:24 pm to provider [facility] , who verbally acknowledged these results. Electronically Signed   By: Delbert Phenix M.D.   On: 12/24/2020 13:30   Result Date: 12/24/2020 CLINICAL DATA:  COVID positive.  Hypoxia.  Dyspnea.  Tachycardia. EXAM: CT ANGIOGRAPHY CHEST WITH CONTRAST TECHNIQUE: Multidetector CT imaging of the chest was performed using the standard protocol during bolus administration of intravenous contrast. Multiplanar CT image reconstructions and MIPs were obtained to evaluate the vascular anatomy. CONTRAST:  59mL OMNIPAQUE IOHEXOL 350 MG/ML SOLN COMPARISON:  Chest radiograph from earlier today. 04/20/2015 chest CT angiogram. FINDINGS: Cardiovascular: The study is moderate quality for the evaluation of pulmonary embolism, with some motion degradation. Acute segmental and subsegmental anterior left lower lobe  pulmonary emboli (series 6/image 161). Tiny subsegmental medial right lower lobe pulmonary embolus (series 6/image 188). No central pulmonary emboli. Atherosclerotic nonaneurysmal thoracic aorta. Markedly dilated main pulmonary artery (4.9 cm diameter), unchanged. Normal heart size. No significant pericardial fluid/thickening. Three-vessel coronary atherosclerosis. Mediastinum/Nodes: No discrete thyroid nodules. Unremarkable esophagus. No pathologically enlarged axillary, mediastinal or hilar lymph nodes. Lungs/Pleura: No pneumothorax. No pleural effusion. Moderate centrilobular and paraseptal  emphysema. Extensive patchy ground-glass opacity throughout both lungs involving all lung lobes, new. No lung masses or significant pulmonary nodules. Upper abdomen: Simple 2.5 cm medial upper left renal cyst. Musculoskeletal: No aggressive appearing focal osseous lesions. Mild thoracic spondylosis. Review of the MIP images confirms the above findings. IMPRESSION: 1. Acute segmental and subsegmental bilateral lower lobe pulmonary emboli. No central pulmonary emboli. 2. Extensive patchy ground-glass opacity throughout both lungs involving all lung lobes, new, compatible with COVID-19 pneumonia. 3. Markedly dilated main pulmonary artery, unchanged, suggesting chronic pulmonary arterial hypertension. 4. Three-vessel coronary atherosclerosis. 5. Aortic Atherosclerosis (ICD10-I70.0) and Emphysema (ICD10-J43.9). Electronically Signed: By: Delbert Phenix M.D. On: 12/24/2020 13:28   DG Chest Portable 1 View  Result Date: 12/24/2020 CLINICAL DATA:  Shortness of breath. EXAM: PORTABLE CHEST 1 VIEW COMPARISON:  May 26, 2015. FINDINGS: The heart size and mediastinal contours are within normal limits. Both lungs are clear. No pneumothorax or pleural effusion is noted. The visualized skeletal structures are unremarkable. IMPRESSION: No active disease. Electronically Signed   By: Lupita Raider M.D.   On: 12/24/2020 09:46

## 2020-12-26 DIAGNOSIS — I2609 Other pulmonary embolism with acute cor pulmonale: Secondary | ICD-10-CM

## 2020-12-26 DIAGNOSIS — F0390 Unspecified dementia without behavioral disturbance: Secondary | ICD-10-CM

## 2020-12-26 DIAGNOSIS — F17200 Nicotine dependence, unspecified, uncomplicated: Secondary | ICD-10-CM

## 2020-12-26 DIAGNOSIS — I214 Non-ST elevation (NSTEMI) myocardial infarction: Secondary | ICD-10-CM

## 2020-12-26 DIAGNOSIS — I2699 Other pulmonary embolism without acute cor pulmonale: Secondary | ICD-10-CM | POA: Diagnosis not present

## 2020-12-26 DIAGNOSIS — U071 COVID-19: Principal | ICD-10-CM

## 2020-12-26 DIAGNOSIS — J9601 Acute respiratory failure with hypoxia: Secondary | ICD-10-CM

## 2020-12-26 DIAGNOSIS — I10 Essential (primary) hypertension: Secondary | ICD-10-CM | POA: Diagnosis not present

## 2020-12-26 LAB — FERRITIN: Ferritin: 834 ng/mL — ABNORMAL HIGH (ref 24–336)

## 2020-12-26 LAB — COMPREHENSIVE METABOLIC PANEL
ALT: 21 U/L (ref 0–44)
AST: 36 U/L (ref 15–41)
Albumin: 3 g/dL — ABNORMAL LOW (ref 3.5–5.0)
Alkaline Phosphatase: 52 U/L (ref 38–126)
Anion gap: 15 (ref 5–15)
BUN: 43 mg/dL — ABNORMAL HIGH (ref 8–23)
CO2: 18 mmol/L — ABNORMAL LOW (ref 22–32)
Calcium: 8.9 mg/dL (ref 8.9–10.3)
Chloride: 105 mmol/L (ref 98–111)
Creatinine, Ser: 1.27 mg/dL — ABNORMAL HIGH (ref 0.61–1.24)
GFR, Estimated: 58 mL/min — ABNORMAL LOW (ref >60)
Glucose, Bld: 232 mg/dL — ABNORMAL HIGH (ref 70–99)
Potassium: 3.8 mmol/L (ref 3.5–5.1)
Sodium: 138 mmol/L (ref 135–145)
Total Bilirubin: 0.8 mg/dL (ref 0.3–1.2)
Total Protein: 6.5 g/dL (ref 6.5–8.1)

## 2020-12-26 LAB — CBC WITH DIFFERENTIAL/PLATELET
Abs Immature Granulocytes: 0.08 10*3/uL — ABNORMAL HIGH (ref 0.00–0.07)
Basophils Absolute: 0.1 10*3/uL (ref 0.0–0.1)
Basophils Relative: 1 %
Eosinophils Absolute: 0 10*3/uL (ref 0.0–0.5)
Eosinophils Relative: 0 %
HCT: 40.4 % (ref 39.0–52.0)
Hemoglobin: 14.9 g/dL (ref 13.0–17.0)
Immature Granulocytes: 1 %
Lymphocytes Relative: 9 %
Lymphs Abs: 1.1 10*3/uL (ref 0.7–4.0)
MCH: 30.5 pg (ref 26.0–34.0)
MCHC: 36.9 g/dL — ABNORMAL HIGH (ref 30.0–36.0)
MCV: 82.8 fL (ref 80.0–100.0)
Monocytes Absolute: 0.6 10*3/uL (ref 0.1–1.0)
Monocytes Relative: 5 %
Neutro Abs: 10.5 10*3/uL — ABNORMAL HIGH (ref 1.7–7.7)
Neutrophils Relative %: 84 %
Platelets: 319 10*3/uL (ref 150–400)
RBC: 4.88 MIL/uL (ref 4.22–5.81)
RDW: 15.1 % (ref 11.5–15.5)
WBC: 12.3 10*3/uL — ABNORMAL HIGH (ref 4.0–10.5)
nRBC: 0 % (ref 0.0–0.2)

## 2020-12-26 LAB — TROPONIN I (HIGH SENSITIVITY)
Troponin I (High Sensitivity): 2703 ng/L (ref <18)
Troponin I (High Sensitivity): 2412 ng/L (ref <18)

## 2020-12-26 LAB — MAGNESIUM
Magnesium: 2.2 mg/dL (ref 1.7–2.4)
Magnesium: 2.2 mg/dL (ref 1.7–2.4)

## 2020-12-26 LAB — GLUCOSE, CAPILLARY
Glucose-Capillary: 253 mg/dL — ABNORMAL HIGH (ref 70–99)
Glucose-Capillary: 248 mg/dL — ABNORMAL HIGH (ref 70–99)
Glucose-Capillary: 277 mg/dL — ABNORMAL HIGH (ref 70–99)
Glucose-Capillary: 244 mg/dL — ABNORMAL HIGH (ref 70–99)

## 2020-12-26 LAB — POTASSIUM: Potassium: 3.6 mmol/L (ref 3.5–5.1)

## 2020-12-26 LAB — PHOSPHORUS: Phosphorus: 3.5 mg/dL (ref 2.5–4.6)

## 2020-12-26 LAB — D-DIMER, QUANTITATIVE: D-Dimer, Quant: 18.98 ug/mL-FEU — ABNORMAL HIGH (ref 0.00–0.50)

## 2020-12-26 LAB — C-REACTIVE PROTEIN: CRP: 7.1 mg/dL — ABNORMAL HIGH (ref <1.0)

## 2020-12-26 MED ORDER — ASPIRIN 81 MG PO CHEW
81.0000 mg | CHEWABLE_TABLET | Freq: Every day | ORAL | Status: DC
  Administered 2020-12-26 – 2020-12-30 (×5): 81 mg via ORAL
  Filled 2020-12-26 (×5): qty 1

## 2020-12-26 MED ORDER — POTASSIUM CHLORIDE CRYS ER 20 MEQ PO TBCR
40.0000 meq | EXTENDED_RELEASE_TABLET | Freq: Once | ORAL | Status: AC
  Administered 2020-12-26: 40 meq via ORAL
  Filled 2020-12-26: qty 2

## 2020-12-26 MED ORDER — METOPROLOL TARTRATE 12.5 MG HALF TABLET
12.5000 mg | ORAL_TABLET | Freq: Two times a day (BID) | ORAL | Status: DC
  Administered 2020-12-26: 12.5 mg via ORAL
  Filled 2020-12-26: qty 1

## 2020-12-26 MED ORDER — METOPROLOL SUCCINATE ER 50 MG PO TB24
50.0000 mg | ORAL_TABLET | Freq: Every day | ORAL | Status: DC
  Administered 2020-12-27 – 2020-12-30 (×4): 50 mg via ORAL
  Filled 2020-12-26 (×4): qty 1

## 2020-12-26 MED ORDER — ATORVASTATIN CALCIUM 40 MG PO TABS
40.0000 mg | ORAL_TABLET | Freq: Every day | ORAL | Status: DC
  Administered 2020-12-26 – 2020-12-29 (×4): 40 mg via ORAL
  Filled 2020-12-26 (×4): qty 1

## 2020-12-26 NOTE — Progress Notes (Addendum)
PROGRESS NOTE                                                                                                                                                                                                             Patient Demographics:    Gary Mcneil, is a 80 y.o. male, DOB - 04/23/1941, ZOX:[id number]  Outpatient Primary MD for the patient is Harvest ForestBakare, Mobolaji B, MD   Admit date - 12/24/2020   LOS - 2  Chief Complaint  Patient presents with  . Shortness of Breath       Brief Narrative: Patient is a 80 y.o. male with PMHx of dementia, HTN, DM-2, chronic systolic heart failure-who presented with 1 week history of shortness of breath-found to have acute hypoxic respiratory failure-due to COVID-19 pneumonia and PE and subsequently admitted to the hospitalist service.  COVID-19 vaccinated status: Vaccinated.  Significant Events: 2/5>> [facility] COVID-19 [facility]  Significant studies: 12/24/2020>> CTA chest: Segmental and subsegmental bilateral lower lobe emboli, multifocal pneumonia 12/25/2020>> Echo: EF 30-35%, RV systolic function mildly reduced. 12/25/2020>> lower extremity Doppler: No DVT 02/26/2020>> Echo: EF 25-30%, right RV systolic function mildly reduced-RVSP 31.5  COVID-19 medications: Steroids: 2/4>> Remdesivir: 2/4>>  Antibiotics: None  Microbiology data: None  Procedures: None  Consults: None  DVT prophylaxis: SCDs Start: 12/24/20 1354 Lovenox at therapeutic dosing    Subjective:   Pleasantly confused this morning on 3 L of oxygen.  Eating breakfast.  Asking for more butter.   Assessment  & Plan :   Acute Hypoxic Resp Failure due to Covid 19 Viral pneumonia and pulmonary embolism: Hypoxemia gradually improving-on steroid/Remdesivir and therapeutic anticoagulation.  Since hypoxia improving-no indication for Actemra/baricitinib.  Continue to follow closely.     Note-rationale/risk/benefits of Actemra use discussed with patient's son (POA) over the phone-patient has no known history of TB/hepatitis B/diverticulitis-son consents to the use of Actemra if patient's hypoxia would worsen any further.  Fever: afebrile O2 requirements:  SpO2: 100 % O2 Flow Rate (L/min): 5 L/min FiO2 (%): 40 %   COVID-19 Labs: Recent Labs    12/24/20 1802 12/25/20 0200 12/26/20 0206  DDIMER >20.00* >20.00* 18.98*  FERRITIN 790* 819* 834*  LDH 290*  --   --   CRP 14.0* 14.5* 7.1*       Component Value Date/Time  BNP 553.8 (H) 12/24/2020 0928    Recent Labs  Lab 12/24/20 1802  PROCALCITON <0.10    Lab Results  Component Value Date   SARSCOV2NAA POSITIVE (A) 12/24/2020     Prone/Incentive Spirometry: Confused-unlikely will be able to follow commands for incentive spirometry.  Pulmonary embolism: Provoked by COVID-19-remains on therapeutic Lovenox-lower extremity Dopplers negative for DVT-echo with mildly reduced RV-which probably is chronic.  Significantly elevated troponins: Probably demand ischemia in the setting of hypoxia/PE-however echo with numerous regional wall motion abnormalities in his left ventricle.  Start low-dose aspirin-beta-blocker-increase Lipitor to 40 mg daily.  Already on full dose anticoagulation.  Have consulted cardiology  Chronic systolic heart failure: Volume status stable  Pulmonary hypertension: CT chest shows enlarged pulmonary artery-prior echo with findings of pulmonary hypertension.    EtOH use: Family acknowledges significant alcohol use-no signs of alcohol withdrawal-start Ativan per CIWA protocol.  HTN: BP stable-continue losartan  HLD: Continue statin-watch LFTs  Gout: No flare-continue allopurinol  DM-2: CBG stable with SSI-follow  Recent Labs    12/25/20 2200 12/26/20 0825 12/26/20 1211  GLUCAP 189* 253* 248*   Dementia: Mildly confused-expect delirium during this hospital stay-maintain delirium  precautions-continue Aricept/Namenda  GERD: Continue PPI  GI prophylaxis: PPI  ABG:    Component Value Date/Time   PHART 7.408 05/01/2015 0406   PCO2ART 37.3 05/01/2015 0406   PO2ART 77.0 (L) 05/01/2015 0406   HCO3 23.4 12/24/2020 0940   TCO2 25 12/24/2020 0940   ACIDBASEDEF 2.0 12/24/2020 0940   O2SAT 59.0 12/24/2020 0940    Vent Settings:     Condition - Extremely Guarded-  Family Communication  :Son-Lezotte Nolawi-[phone] updated over the phone   Code Status :  Full Code  Diet :  Diet Order            Diet Heart Room service appropriate? Yes; Fluid consistency: Thin  Diet effective now                  Disposition Plan  :   Status is: Inpatient  Remains inpatient appropriate because:Inpatient level of care appropriate due to severity of illness   Dispo: The patient is from: Home              Anticipated d/c is to: Home              Anticipated d/c date is: > 3 days              Patient currently is not medically stable to d/c.   Difficult to place patient No     Barriers to discharge: Hypoxia requiring O2 supplementation/complete 5 days of IV Remdesivir  Antimicorbials  :    Anti-infectives (From admission, onward)   Start     Dose/Rate Route Frequency Ordered Stop   12/25/20 1000  remdesivir 100 mg in sodium chloride 0.9 % 100 mL IVPB       "Followed by" Linked Group Details   100 mg 200 mL/hr over 30 Minutes Intravenous Daily 12/24/20 1354 12/29/20 0959   12/24/20 1353  remdesivir 200 mg in sodium chloride 0.9% 250 mL IVPB       "Followed by" Linked Group Details   200 mg 580 mL/hr over 30 Minutes Intravenous Once 12/24/20 1354 12/24/20 1821      Inpatient Medications  Scheduled Meds: . albuterol  2 puff Inhalation Q6H  . allopurinol  200 mg Oral Daily  . atorvastatin  20 mg Oral QHS  . memantine  28 mg Oral  QHS   And  . donepezil  10 mg Oral QHS  . enoxaparin (LOVENOX) injection  1 mg/kg Subcutaneous Q12H  . folic acid  1 mg  Oral Daily  . insulin aspart  0-15 Units Subcutaneous TID WC  . losartan  100 mg Oral Daily  . methylPREDNISolone (SOLU-MEDROL) injection  1 mg/kg Intravenous Q12H   Followed by  . [START ON 12/27/2020] predniSONE  50 mg Oral Daily  . multivitamin with minerals  1 tablet Oral Daily  . pantoprazole  40 mg Oral Q1200  . thiamine  100 mg Oral Daily   Or  . thiamine  100 mg Intravenous Daily   Continuous Infusions: . remdesivir 100 mg in NS 100 mL 100 mg (12/26/20 0918)   PRN Meds:.guaiFENesin, LORazepam **OR** LORazepam   Time Spent in minutes  35   See all Orders from today for further details   Jeoffrey Massed M.D on 12/26/2020 at 2:12 PM  To page go to [email or web address] - use universal password  [facility] -  Office  [phone]    Objective:   Vitals:   12/25/20 1324 12/25/20 1731 12/25/20 2203 12/26/20 0618  BP: (!) 147/88 124/74 129/86   Pulse: (!) 117 92 84 84  Resp: (!) 22     Temp:   98.9 F (37.2 C) 98.6 F (37 C)  TempSrc:   Oral Oral  SpO2: 100%     Weight:      Height:        Wt Readings from Last 3 Encounters:  12/24/20 80.3 kg  02/09/20 79.7 kg  11/20/19 80.3 kg     Intake/Output Summary (Last 24 hours) at 12/26/2020 1412 Last data filed at 12/26/2020 0649 Gross per 24 hour  Intake 468.01 ml  Output 400 ml  Net 68.01 ml     Physical Exam Gen Exam:Alert awake-not in any distress HEENT:atraumatic, normocephalic Chest: B/L clear to auscultation anteriorly CVS:S1S2 regular Abdomen:soft non tender, non distended Extremities:no edema Neurology: Non focal Skin: no rash   Data Review:    CBC Recent Labs  Lab 12/24/20 0928 12/24/20 0940 12/24/20 1802 12/25/20 0200 12/26/20 0206  WBC 8.3  --  6.3 4.6 12.3*  HGB 15.8 15.6 14.7 15.1 14.9  HCT 44.9 46.0 41.7 43.0 40.4  PLT 271  --  233 258 319  MCV 85.2  --  84.4 84.8 82.8  MCH 30.0  --  29.8 29.8 30.5  MCHC 35.2  --  35.3 35.1 36.9*  RDW 15.0  --  14.9 15.2 15.1  LYMPHSABS 1.4  --    --  0.8 1.1  MONOABS 0.5  --   --  0.1 0.6  EOSABS 0.1  --   --  0.0 0.0  BASOSABS 0.1  --   --  0.0 0.1    Chemistries  Recent Labs  Lab 12/24/20 0928 12/24/20 0940 12/24/20 1802 12/25/20 0200 12/25/20 0712 12/25/20 1233 12/25/20 2305 12/26/20 0206  NA 139 141  --   --  141 138  --  138  K 3.2* 3.0*  --   --  3.7 3.6 3.6 3.8  CL 104  --   --   --  105 103  --  105  CO2 20*  --   --   --  21* 19*  --  18*  GLUCOSE 165*  --   --   --  213* 261*  --  232*  BUN 26*  --   --   --  27* 35*  --  43*  CREATININE 1.13  --  1.05  --  1.09 1.25*  --  1.27*  CALCIUM 9.0  --   --   --  8.7* 8.8*  --  8.9  MG  --   --   --  2.2  --   --  2.2 2.2  AST 48*  --   --   --  33 47*  --  36  ALT 24  --   --   --  22 25  --  21  ALKPHOS 64  --   --   --  60 57  --  52  BILITOT 1.4*  --   --   --  1.1 0.7  --  0.8   ------------------------------------------------------------------------------------------------------------------ No results for input(s): CHOL, HDL, LDLCALC, TRIG, CHOLHDL, LDLDIRECT in the last 72 hours.  Lab Results  Component Value Date   HGBA1C 7.0 (H) 12/24/2020   ------------------------------------------------------------------------------------------------------------------ No results for input(s): TSH, T4TOTAL, T3FREE, THYROIDAB in the last 72 hours.  Invalid input(s): FREET3 ------------------------------------------------------------------------------------------------------------------ Recent Labs    12/25/20 0200 12/26/20 0206  FERRITIN 819* 834*    Coagulation profile No results for input(s): INR, PROTIME in the last 168 hours.  Recent Labs    12/25/20 0200 12/26/20 0206  DDIMER >20.00* 18.98*    Cardiac Enzymes No results for input(s): CKMB, TROPONINI, MYOGLOBIN in the last 168 hours.  Invalid input(s): CK ------------------------------------------------------------------------------------------------------------------    Component Value  Date/Time   BNP 553.8 (H) 12/24/2020 1610    Micro Results Recent Results (from the past 240 hour(s))  SARS Coronavirus 2 by RT PCR (hospital order, performed in [facility] hospital lab) Nasopharyngeal Nasopharyngeal Swab     Status: Abnormal   Collection Time: 12/24/20  9:20 AM   Specimen: Nasopharyngeal Swab  Result Value Ref Range Status   SARS Coronavirus 2 POSITIVE (A) NEGATIVE Final    Comment: RESULT CALLED TO, READ BACK BY AND VERIFIED WITH: RN S.KLONKET AT 1153 ON 12/24/2020 BY T.SAAD (NOTE) SARS-CoV-2 target nucleic acids are DETECTED  SARS-CoV-2 RNA is generally detectable in upper respiratory specimens  during the acute phase of infection.  Positive results are indicative  of the presence of the identified virus, but do not rule out bacterial infection or co-infection with other pathogens not detected by the test.  Clinical correlation with patient history and  other diagnostic information is necessary to determine patient infection status.  The expected result is negative.  Fact Sheet for Patients:   BoilerBrush.com.cy   Fact Sheet for Healthcare Providers:   https://pope.com/    This test is not yet approved or cleared by the [place] FDA and  has been authorized for detection and/or diagnosis of SARS-CoV-2 by FDA under an Emergency Use Authorization (EUA).  This EUA will remain in effect (meani ng this test can be used) for the duration of  the COVID-19 declaration under Section 564(b)(1) of the Act, 21 U.S.C. section 360-bbb-3(b)(1), unless the authorization is terminated or revoked sooner.  Performed at [facility], [address], [place], [place] [address]     Radiology Reports CT Angio Chest PE W and/or Wo Contrast  Addendum Date: 12/24/2020   ADDENDUM REPORT: 12/24/2020 13:30 ADDENDUM: Critical Value/emergent results were called by telephone at the time of interpretation on 12/24/2020 at 1:24  pm to provider [facility] , who verbally acknowledged these results. Electronically Signed   By: Delbert Phenix M.D.   On: 12/24/2020 13:30  Result Date: 12/24/2020 CLINICAL DATA:  COVID positive.  Hypoxia.  Dyspnea.  Tachycardia. EXAM: CT ANGIOGRAPHY CHEST WITH CONTRAST TECHNIQUE: Multidetector CT imaging of the chest was performed using the standard protocol during bolus administration of intravenous contrast. Multiplanar CT image reconstructions and MIPs were obtained to evaluate the vascular anatomy. CONTRAST:  77mL OMNIPAQUE IOHEXOL 350 MG/ML SOLN COMPARISON:  Chest radiograph from earlier today. 04/20/2015 chest CT angiogram. FINDINGS: Cardiovascular: The study is moderate quality for the evaluation of pulmonary embolism, with some motion degradation. Acute segmental and subsegmental anterior left lower lobe pulmonary emboli (series 6/image 161). Tiny subsegmental medial right lower lobe pulmonary embolus (series 6/image 188). No central pulmonary emboli. Atherosclerotic nonaneurysmal thoracic aorta. Markedly dilated main pulmonary artery (4.9 cm diameter), unchanged. Normal heart size. No significant pericardial fluid/thickening. Three-vessel coronary atherosclerosis. Mediastinum/Nodes: No discrete thyroid nodules. Unremarkable esophagus. No pathologically enlarged axillary, mediastinal or hilar lymph nodes. Lungs/Pleura: No pneumothorax. No pleural effusion. Moderate centrilobular and paraseptal emphysema. Extensive patchy ground-glass opacity throughout both lungs involving all lung lobes, new. No lung masses or significant pulmonary nodules. Upper abdomen: Simple 2.5 cm medial upper left renal cyst. Musculoskeletal: No aggressive appearing focal osseous lesions. Mild thoracic spondylosis. Review of the MIP images confirms the above findings. IMPRESSION: 1. Acute segmental and subsegmental bilateral lower lobe pulmonary emboli. No central pulmonary emboli. 2. Extensive patchy ground-glass opacity  throughout both lungs involving all lung lobes, new, compatible with COVID-19 pneumonia. 3. Markedly dilated main pulmonary artery, unchanged, suggesting chronic pulmonary arterial hypertension. 4. Three-vessel coronary atherosclerosis. 5. Aortic Atherosclerosis (ICD10-I70.0) and Emphysema (ICD10-J43.9). Electronically Signed: By: Delbert Phenix M.D. On: 12/24/2020 13:28   DG Chest Portable 1 View  Result Date: 12/24/2020 CLINICAL DATA:  Shortness of breath. EXAM: PORTABLE CHEST 1 VIEW COMPARISON:  May 26, 2015. FINDINGS: The heart size and mediastinal contours are within normal limits. Both lungs are clear. No pneumothorax or pleural effusion is noted. The visualized skeletal structures are unremarkable. IMPRESSION: No active disease. Electronically Signed   By: Lupita Raider M.D.   On: 12/24/2020 09:46   ECHOCARDIOGRAM COMPLETE  Result Date: 12/25/2020    ECHOCARDIOGRAM REPORT   Patient Name:   VEGA WITHROW Date of Exam: 12/25/2020 Medical Rec #:  [id number]       Height:       72.0 in Accession #:    [id number]      Weight:       177.0 lb Date of Birth:  08-17-1941        BSA:          2.023 m Patient Age:    79 years        BP:           147/88 mmHg Patient Gender: M               HR:           92 bpm. Exam Location:  Inpatient Procedure: 2D Echo, Cardiac Doppler and Color Doppler Indications:    I50.31 Acute diastolic (congestive) heart failure  History:        Patient has prior history of Echocardiogram examinations, most                 recent 02/26/2020. Covid 19 positive.  Sonographer:    Roosvelt Maser RDCS Referring Phys: [phone] EKTA V PATEL  Sonographer Comments: No parasternal window and Technically difficult study due to poor echo windows. Covid 19 coughing. IMPRESSIONS  1. Left ventricular ejection fraction,  by estimation, is 30 to 35%. The left ventricle has moderately decreased function. The left ventricle demonstrates global hypokinesis. There is mild left ventricular hypertrophy. Left ventricular  diastolic function  could not be evaluated. There is severe hypokinesis of the left ventricular, mid-apical anterior wall, apical segment and anteroseptal wall.  2. Right ventricular systolic function is mildly reduced. The right ventricular size is normal. There is normal pulmonary artery systolic pressure.  3. The mitral valve was not assessed. No evidence of mitral valve regurgitation.  4. Tricuspid valve regurgitation is mild to moderate.  5. The aortic valve was not assessed. Aortic valve regurgitation is not visualized.  6. The inferior vena cava is dilated in size with >50% respiratory variability, suggesting right atrial pressure of 8 mmHg. Comparison(s): Changes from prior study are noted. 02/26/2020: LVEF 25-30%, global hypokinesis. FINDINGS  Left Ventricle: Left ventricular ejection fraction, by estimation, is 30 to 35%. The left ventricle has moderately decreased function. The left ventricle demonstrates global hypokinesis. Severe hypokinesis of the left ventricular, mid-apical anterior wall, apical segment and anteroseptal wall. The left ventricular internal cavity size was normal in size. There is mild left ventricular hypertrophy. Left ventricular diastolic function could not be evaluated. Right Ventricle: The right ventricular size is normal. No increase in right ventricular wall thickness. Right ventricular systolic function is mildly reduced. There is normal pulmonary artery systolic pressure. The tricuspid regurgitant velocity is 2.29 m/s, and with an assumed right atrial pressure of 8 mmHg, the estimated right ventricular systolic pressure is 29.0 mmHg. Left Atrium: Left atrial size was normal in size. Right Atrium: Right atrial size was normal in size. Pericardium: There is no evidence of pericardial effusion. Mitral Valve: The mitral valve was not assessed. No evidence of mitral valve regurgitation. Tricuspid Valve: The tricuspid valve is grossly normal. Tricuspid valve regurgitation is mild to  moderate. Aortic Valve: The aortic valve was not assessed. Aortic valve regurgitation is not visualized. Pulmonic Valve: The pulmonic valve was not assessed. Pulmonic valve regurgitation is not visualized. Aorta: Aortic root could not be assessed. Venous: The inferior vena cava is dilated in size with greater than 50% respiratory variability, suggesting right atrial pressure of 8 mmHg. IAS/Shunts: No atrial level shunt detected by color flow Doppler.  IVC IVC diam: 2.10 cm LEFT ATRIUM           Index       RIGHT ATRIUM           Index LA Vol (A4C): 43.7 ml 21.60 ml/m RA Area:     12.20 cm                                   RA Volume:   30.90 ml  15.28 ml/m  TRICUSPID VALVE TR Peak grad:   21.0 mmHg TR Vmax:        229.00 cm/s Zoila Shutter MD Electronically signed by Zoila Shutter MD Signature Date/Time: 12/25/2020/4:34:18 PM    Final    VAS [place] LOWER EXTREMITY VENOUS (DVT)  Result Date: 12/25/2020  Lower Venous DVT Study Indications: Covid-19, elevated D-Dimer.  Limitations: Body habitus. Comparison Study: Prior negative left LEV duplex is available from 04/18/12 Performing Technologist: Sherren Kerns RVS  Examination Guidelines: A complete evaluation includes B-mode imaging, spectral Doppler, color Doppler, and power Doppler as needed of all accessible portions of each vessel. Bilateral testing is considered an integral part of a complete examination. Limited examinations  for reoccurring indications may be performed as noted. The reflux portion of the exam is performed with the patient in reverse Trendelenburg.  +---------+---------------+---------+-----------+----------+-------------------+ RIGHT    CompressibilityPhasicitySpontaneityPropertiesThrombus Aging      +---------+---------------+---------+-----------+----------+-------------------+ CFV      Full           Yes      Yes                  sluggish flow noted +---------+---------------+---------+-----------+----------+-------------------+  SFJ      Full                                                             +---------+---------------+---------+-----------+----------+-------------------+ FV Prox  Full                                         sluggish flow noted +---------+---------------+---------+-----------+----------+-------------------+ FV Mid   Full                                                             +---------+---------------+---------+-----------+----------+-------------------+ FV DistalFull                                                             +---------+---------------+---------+-----------+----------+-------------------+ PFV      Full                                         sluggish flow noted +---------+---------------+---------+-----------+----------+-------------------+ POP      Full                                                             +---------+---------------+---------+-----------+----------+-------------------+ PTV                                                   patent by color     +---------+---------------+---------+-----------+----------+-------------------+ PERO                                                  patent by color     +---------+---------------+---------+-----------+----------+-------------------+   +---------+---------------+---------+-----------+----------+---------------+ LEFT     CompressibilityPhasicitySpontaneityPropertiesThrombus Aging  +---------+---------------+---------+-----------+----------+---------------+ CFV      Full           Yes      Yes  sluggish flow   +---------+---------------+---------+-----------+----------+---------------+ SFJ      Full                                                         +---------+---------------+---------+-----------+----------+---------------+ FV Prox  Full                                                          +---------+---------------+---------+-----------+----------+---------------+ FV Mid   Full                                                         +---------+---------------+---------+-----------+----------+---------------+ FV DistalFull                                                         +---------+---------------+---------+-----------+----------+---------------+ PFV      Full                                                         +---------+---------------+---------+-----------+----------+---------------+ POP                     Yes      Yes                  patent by color +---------+---------------+---------+-----------+----------+---------------+ PTV      Full                                                         +---------+---------------+---------+-----------+----------+---------------+ PERO     Full                                                         +---------+---------------+---------+-----------+----------+---------------+    Summary: BILATERAL: - No evidence of deep vein thrombosis seen in the lower extremities, bilaterally. -   *See table(s) above for measurements and observations.    Preliminary

## 2020-12-26 NOTE — Consult Note (Signed)
Cardiology Consult    Patient ID: Gary Mcneil; [id number]; [birth year]   Admit date: 12/24/2020 Date of Consult: 12/26/2020  Primary Care Provider: Har[place], MD Primary Cardiologist: New to [facility] - Dr. Delton See  Patient Profile    Gary Mcneil is a 80 y.o. male with past medical history of chronic combined systolic and diastolic CHF (EF 58-52% by echo in 02/2020 with no additional workup performed by review of notes), HTN, HLD, Type 2 DM and dementia who is being seen today for the evaluation of elevated troponin values and cardiomyopathy at the request of Dr. Jerral Ralph.   History of Present Illness    [name] presented to [facility] ED on 12/24/2020 for evaluation of worsening dyspnea. Upon EMS arrival, saturations were initially in the 70's on RA and he required NRB. He reported worsening dyspnea at rest and with activity for the past week. Denied any chest pain or palpitations but was not able to elaborate on symptoms given his dementia.   Initial labs showed WBC 8.1, Hgb 15.8, platelets 271, Na+ 139, K+ 3.2 and creatinine 1.13. AST 48 and ALT 24. Hgb A1c 7.0. D-dimer > 20. CRP 14. Initial lactic acid 2.7 with repeat of 1.9. COVID positive. BNP elevated to 553. Initial HS Troponin 59 with repeat values of 102, and 164. CXR showed no acute findings. CTA showed acute segmental and subsegmental bilateral lower lobe pulmonary emboli and extensive patchy ground-glass opacity throughout both lungs and compatible with COVID-19 PNA. Also noted to have pulmonary HTN, 3-vessel coronary atherosclerosis and aortic atherosclerosis. EKG showed sinus tachycardia, HR 114 with PVC's, LVH and known RBBB.   He was started on Remdesivir and steroids at the time of admission for COVID PNA. Was also started on therapeutic Lovenox for his PE. A repeat echocardiogram was performed and shows a reduced EF of 30-35% with global HK and RV function mildly reduced. No significant valve abnormalities.   During  the early morning hours of 2/6, he was noted to be tachycardiac in the 170's. Tachycardiac resolved by the time his EKG was obtained but this did show more prominent TWI along the precordial leads. He was asymptomatic and denied any chest pain or palpitations. Repeat HS Troponin values were elevated to 2703 and 2412.  Patient states that he is currently chest pain-free and he has not been experiencing any chest pain episodes this hospital visit, on arrival he was short of breath but that has improved since then.  Prior to admission he has not been experiencing any chest pain episodes either, denies any orthopnea proximal nocturnal dyspnea.  Past Medical History:  Diagnosis Date  . "walking corpse" syndrome   . Acute kidney failure, unspecified (HCC)   . Acute kidney failure, unspecified (HCC)   . Anemia, unspecified   . Arthritis   . Dementia   . Dementia (HCC) 04/24/2012  . Disorder of bone and cartilage, unspecified   . Dizziness and giddiness   . GERD (gastroesophageal reflux disease)   . Gout   . Gout, unspecified   . Hypertension   . Hypopotassemia   . Hypotension, unspecified   . Hypotension, unspecified   . Memory loss   . Memory loss   . Muscle weakness (generalized)   . Myalgia and myositis, unspecified   . Neuromuscular disorder (HCC)   . Other and unspecified hyperlipidemia   . Screening for ischemic heart disease   . Tobacco use disorder   . Type II or unspecified type  diabetes mellitus without mention of complication, uncontrolled   . Type II or unspecified type diabetes mellitus without mention of complication, uncontrolled   . Unspecified hereditary and idiopathic peripheral neuropathy   . Unspecified vitamin D deficiency     Past Surgical History:  Procedure Laterality Date  . INGUINAL HERNIA REPAIR    . VIDEO ASSISTED THORACOSCOPY (VATS)/EMPYEMA Right 04/30/2015   Procedure: VIDEO ASSISTED THORACOSCOPY (VATS)/DRAIN EMPYEMA;  Surgeon: [name], MD;   Location: [facility] OR;  Service: Thoracic;  Laterality: Right;  [name] VIDEO BRONCHOSCOPY N/A 04/30/2015   Procedure: VIDEO BRONCHOSCOPY;  Surgeon: [name], MD;  Location: [facility] OR;  Service: Thoracic;  Laterality: N/A;     Home Medications:  Prior to Admission medications   Medication Sig Start Date End Date Taking? Authorizing Provider  allopurinol (ZYLOPRIM) 300 MG tablet Take 300 mg by mouth daily.   Yes [provider]  atorvastatin (LIPITOR) 20 MG tablet Take 20 mg by mouth at bedtime. 01/19/20  Yes [provider]  linagliptin (TRADJENTA) 5 MG TABS tablet Take 5 mg by mouth daily.   Yes [provider]  losartan (COZAAR) 100 MG tablet TAKE 1 TABLET(100 MG) BY MOUTH DAILY 06/10/19  Yes [name], NP  Memantine HCl-Donepezil HCl (NAMZARIC) 28-10 MG CP24 Take 1 capsule by mouth daily. 06/10/19  Yes [name], NP  metFORMIN (GLUCOPHAGE) 500 MG tablet TAKE 1 TABLET BY MOUTH EVERY DAY WITH BREAKFAST Patient taking differently: Take 500 mg by mouth daily with breakfast. TAKE 1 TABLET BY MOUTH EVERY DAY WITH BREAKFAST 06/10/19  Yes [name], NP  Multiple Vitamin (MULTIVITAMIN WITH MINERALS) TABS tablet Take 1 tablet by mouth daily.   Yes [provider]  potassium chloride SA (KLOR-CON) 20 MEQ tablet Take 3 tablets (60 mEq total) by mouth daily. 11/20/19  Yes [name], NP    Inpatient Medications: Scheduled Meds: . albuterol  2 puff Inhalation Q6H  . allopurinol  200 mg Oral Daily  . aspirin  81 mg Oral Daily  . atorvastatin  40 mg Oral QHS  . memantine  28 mg Oral QHS   And  . donepezil  10 mg Oral QHS  . enoxaparin (LOVENOX) injection  1 mg/kg Subcutaneous Q12H  . folic acid  1 mg Oral Daily  . insulin aspart  0-15 Units Subcutaneous TID WC  . losartan  100 mg Oral Daily  . methylPREDNISolone (SOLU-MEDROL) injection  1 mg/kg Intravenous Q12H   Followed by  . [START ON 12/27/2020] predniSONE  50 mg Oral Daily  . metoprolol  tartrate  12.5 mg Oral BID  . multivitamin with minerals  1 tablet Oral Daily  . pantoprazole  40 mg Oral Q1200  . thiamine  100 mg Oral Daily   Or  . thiamine  100 mg Intravenous Daily   Continuous Infusions: . remdesivir 100 mg in NS 100 mL 100 mg (12/26/20 0918)   PRN Meds: guaiFENesin, LORazepam **OR** LORazepam  Allergies:    Allergies  Allergen Reactions  . Penicillins Rash    Social History:   Social History   Socioeconomic History  . Marital status: Single    Spouse name: Not on file  . Number of children: 3  . Years of education: Not on file  . Highest education level: Not on file  Occupational History  . Occupation: Retired Charity fundraiser from [employer]  . Occupation: Retired Education officer, environmental  Tobacco Use  . Smoking status: Former Engineer, civil (consulting)  date: 11/20/1969    Years since quitting: 51.1  . Smokeless tobacco: Never Used  Vaping Use  . Vaping Use: Never used  Substance and Sexual Activity  . Alcohol use: Yes    Comment: daily- to every other day, combination or beer, wine, and liquor.per son patient drinks beer and liquor heavy  . Drug use: Not Currently    Types: Marijuana  . Sexual activity: Never  Other Topics Concern  . Not on file  Social History Narrative   Bachelors in Nurse, adult in [place]   Attended [employer] and [employer]            Social Determinants of Health   Financial Resource Strain: Not on file  Food Insecurity: Not on file  Transportation Needs: Not on file  Physical Activity: Not on file  Stress: Not on file  Social Connections: Not on file  Intimate Partner Violence: Not on file     Family History:    Family History  Problem Relation Age of Onset  . Diabetes Mother   . Hypertension Mother   . Diabetes Father   . Hypertension Father   . Kidney disease Father   . Diabetes Sister   . Kidney disease Brother       Review of Systems    General:  No chills, fever, night sweats or weight changes.  Cardiovascular:   No chest pain, edema, orthopnea, palpitations, paroxysmal nocturnal dyspnea. Positive for dyspnea on exertion.  Dermatological: No rash, lesions/masses Respiratory: Positive for cough and dyspnea. Urologic: No hematuria, dysuria Abdominal:   No nausea, vomiting, diarrhea, bright red blood per rectum, melena, or hematemesis Neurologic:  No visual changes, wkns, changes in mental status. All other systems reviewed and are otherwise negative except as noted above.  Physical Exam/Data    Vitals:   12/25/20 1324 12/25/20 1731 12/25/20 2203 12/26/20 0618  BP: (!) 147/88 124/74 129/86   Pulse: (!) 117 92 84 84  Resp: (!) 22     Temp:   98.9 F (37.2 C) 98.6 F (37 C)  TempSrc:   Oral Oral  SpO2: 100%     Weight:      Height:        Intake/Output Summary (Last 24 hours) at 12/26/2020 1452 Last data filed at 12/26/2020 0649 Gross per 24 hour  Intake 468.01 ml  Output 400 ml  Net 68.01 ml   Filed Weights   12/24/20 0925  Weight: 80.3 kg   Body mass index is 24.01 kg/m.   General: Pleasant, NAD Psych: Normal affect. Neuro: Alert and oriented X 3. Moves all extremities spontaneously. HEENT: Normal  Neck: Supple without bruits or JVD. Lungs:  Resp regular and unlabored, CTA. On supplemental oxygen.  Heart: RRR no s3, s4, or murmurs. Abdomen: Soft, non-tender, non-distended, BS + x 4.  Extremities: No clubbing, cyanosis or edema. DP/PT/Radials 2+ and equal bilaterally.  EKG:  The EKG was personally reviewed and demonstrates: Sinus tachycardia, HR 114 with PVC's, LVH and known RBBB.   Labs/Studies    Relevant CV Studies:  Echocardiogram: 02/2020 IMPRESSIONS   1. Left ventricular ejection fraction, by estimation, is 25 to 30%. The  left ventricle has severely decreased function. This is a significant  change from prior echo. The left ventricle demonstrates global  hypokinesis. The left ventricular internal  cavity size was mildly dilated. Left ventricular diastolic  parameters are  consistent with Grade I diastolic dysfunction (impaired relaxation).  2. Right ventricular systolic function is  mildly reduced. The right  ventricular size is normal. There is mildly elevated pulmonary artery  systolic pressure. The estimated right ventricular systolic pressure is  31.5 mmHg.  3. The mitral valve is normal in structure. No evidence of mitral valve  regurgitation. No evidence of mitral stenosis.  4. The aortic valve is tricuspid. Aortic valve regurgitation is trivial.  Mild aortic valve sclerosis is present, with no evidence of aortic valve  stenosis.  5. The inferior vena cava is normal in size with greater than 50%  respiratory variability, suggesting right atrial pressure of 3 mmHg.   Comparison(s): 04/20/15 EF 55-60%. PA pressure .   Echocardiogram: 12/25/2020 1. Left ventricular ejection fraction, by estimation, is 30 to 35%. The  left ventricle has moderately decreased function. The left ventricle  demonstrates global hypokinesis. There is mild left ventricular  hypertrophy. Left ventricular diastolic function  could not be evaluated. There is severe hypokinesis of the left  ventricular, mid-apical anterior wall, apical segment and anteroseptal  wall.  2. Right ventricular systolic function is mildly reduced. The right  ventricular size is normal. There is normal pulmonary artery systolic  pressure.  3. The mitral valve was not assessed. No evidence of mitral valve  regurgitation.  4. Tricuspid valve regurgitation is mild to moderate.  5. The aortic valve was not assessed. Aortic valve regurgitation is not  visualized.  6. The inferior vena cava is dilated in size with >50% respiratory  variability, suggesting right atrial pressure of 8 mmHg.   Comparison(s): Changes from prior study are noted. 02/26/2020: LVEF 25-30%,  global hypokinesis.   Laboratory Data:  Chemistry Recent Labs  Lab 12/25/20 0712 12/25/20 1233  12/25/20 2305 12/26/20 0206  NA 141 138  --  138  K 3.7 3.6 3.6 3.8  CL 105 103  --  105  CO2 21* 19*  --  18*  GLUCOSE 213* 261*  --  232*  BUN 27* 35*  --  43*  CREATININE 1.09 1.25*  --  1.27*  CALCIUM 8.7* 8.8*  --  8.9  GFRNONAA >60 59*  --  58*  ANIONGAP 15 16*  --  15    Recent Labs  Lab 12/25/20 0712 12/25/20 1233 12/26/20 0206  PROT 7.2 7.0 6.5  ALBUMIN 2.8* 3.0* 3.0*  AST 33 47* 36  ALT 22 25 21   ALKPHOS 60 57 52  BILITOT 1.1 0.7 0.8   Hematology Recent Labs  Lab 12/24/20 1802 12/25/20 0200 12/26/20 0206  WBC 6.3 4.6 12.3*  RBC 4.94 5.07 4.88  HGB 14.7 15.1 14.9  HCT 41.7 43.0 40.4  MCV 84.4 84.8 82.8  MCH 29.8 29.8 30.5  MCHC 35.3 35.1 36.9*  RDW 14.9 15.2 15.1  PLT 233 258 319   Cardiac EnzymesNo results for input(s): TROPONINI in the last 168 hours. No results for input(s): TROPIPOC in the last 168 hours.  BNP Recent Labs  Lab 12/24/20 0928  BNP 553.8*    DDimer  Recent Labs  Lab 12/24/20 1802 12/25/20 0200 12/26/20 0206  DDIMER >20.00* >20.00* 18.98*    Radiology/Studies:  CT Angio Chest PE W and/or Wo Contrast  Addendum Date: 12/24/2020   ADDENDUM REPORT: 12/24/2020 13:30 ADDENDUM: Critical Value/emergent results were called by telephone at the time of interpretation on 12/24/2020 at 1:24 pm to provider JOSHUA ZAVITZ , who verbally acknowledged these results. Electronically Signed   By: Delbert Phenix M.D.   On: 12/24/2020 13:30   Result Date: 12/24/2020 CLINICAL DATA:  COVID positive.  Hypoxia.  Dyspnea.  Tachycardia. EXAM: CT ANGIOGRAPHY CHEST WITH CONTRAST TECHNIQUE: Multidetector CT imaging of the chest was performed using the standard protocol during bolus administration of intravenous contrast. Multiplanar CT image reconstructions and MIPs were obtained to evaluate the vascular anatomy. CONTRAST:  77mL OMNIPAQUE IOHEXOL 350 MG/ML SOLN COMPARISON:  Chest radiograph from earlier today. 04/20/2015 chest CT angiogram. FINDINGS: Cardiovascular:  The study is moderate quality for the evaluation of pulmonary embolism, with some motion degradation. Acute segmental and subsegmental anterior left lower lobe pulmonary emboli (series 6/image 161). Tiny subsegmental medial right lower lobe pulmonary embolus (series 6/image 188). No central pulmonary emboli. Atherosclerotic nonaneurysmal thoracic aorta. Markedly dilated main pulmonary artery (4.9 cm diameter), unchanged. Normal heart size. No significant pericardial fluid/thickening. Three-vessel coronary atherosclerosis. Mediastinum/Nodes: No discrete thyroid nodules. Unremarkable esophagus. No pathologically enlarged axillary, mediastinal or hilar lymph nodes. Lungs/Pleura: No pneumothorax. No pleural effusion. Moderate centrilobular and paraseptal emphysema. Extensive patchy ground-glass opacity throughout both lungs involving all lung lobes, new. No lung masses or significant pulmonary nodules. Upper abdomen: Simple 2.5 cm medial upper left renal cyst. Musculoskeletal: No aggressive appearing focal osseous lesions. Mild thoracic spondylosis. Review of the MIP images confirms the above findings. IMPRESSION: 1. Acute segmental and subsegmental bilateral lower lobe pulmonary emboli. No central pulmonary emboli. 2. Extensive patchy ground-glass opacity throughout both lungs involving all lung lobes, new, compatible with COVID-19 pneumonia. 3. Markedly dilated main pulmonary artery, unchanged, suggesting chronic pulmonary arterial hypertension. 4. Three-vessel coronary atherosclerosis. 5. Aortic Atherosclerosis (ICD10-I70.0) and Emphysema (ICD10-J43.9). Electronically Signed: By: Delbert PhenixJason A Poff M.D. On: 12/24/2020 13:28   DG Chest Portable 1 View  Result Date: 12/24/2020 CLINICAL DATA:  Shortness of breath. EXAM: PORTABLE CHEST 1 VIEW COMPARISON:  May 26, 2015. FINDINGS: The heart size and mediastinal contours are within normal limits. Both lungs are clear. No pneumothorax or pleural effusion is noted. The  visualized skeletal structures are unremarkable. IMPRESSION: No active disease. Electronically Signed   By: Lupita RaiderJames  Green Jr M.D.   On: 12/24/2020 09:46   ECHOCARDIOGRAM COMPLETE Result Date: 12/25/2020 1. Left ventricular ejection fraction, by estimation, is 30 to 35%. The  left ventricle has moderately decreased function. The left ventricle  demonstrates global hypokinesis. There is mild left ventricular  hypertrophy. Left ventricular diastolic function  could not be evaluated. There is severe hypokinesis of the left  ventricular, mid-apical anterior wall, apical segment and anteroseptal  wall.  2. Right ventricular systolic function is mildly reduced. The right  ventricular size is normal. There is normal pulmonary artery systolic  pressure.  3. The mitral valve was not assessed. No evidence of mitral valve  regurgitation.  4. Tricuspid valve regurgitation is mild to moderate.  5. The aortic valve was not assessed. Aortic valve regurgitation is not  visualized.  6. The inferior vena cava is dilated in size with >50% respiratory  variability, suggesting right atrial pressure of 8 mmHg.    Assessment & Plan    1. Chronic Combined Systolic and Diastolic CHF/Cardiomyopathy - His EF was previously at 25-30% by echo in 02/2020 and repeat imaging this admission shows it has actually improved to 30-35% with global HK and mildly reduced RV function. -The patient is completely asymptomatic and denies any chest pain and his shortness of breath has improved. -His troponin elevation is slightly higher than we would expect in the settings of demand ischemia however it most probably represents severe sepsis with Covid infection, acute pulmonary embolism. - Given his COVID-19 and acute PE, he is  not a candidate for aggressive cardiac work-up at this time and given his underlying dementia, it is unclear if this would be a reasonable option prior to his acute illness. At this time, would focus on  medical therapy. Currently on Losartan 100mg  daily and Lopressor 12.5mg  BID was recently initiated. Would plan to transition Lopressor to Toprol-XL 50 mg p.o. daily.  2. NSTEMI - Initial HS Troponin was 59 with repeat values of 102, and 164 on admission. More recently elevated to 2703 and 2412. He did not report any anginal symptoms prior to admission but history is limited secondary to his dementia.  - He is on full-dose Lovenox. Continue this along with ASA 81mg  daily, Atorvastatin 40mg  daily and Toprol-XL 50 mg p.o. daily. No plans for ischemic evaluation at this time in the setting of COVID PNA.   3. COVID PNA - He has been started on Remdesivir and steroids. Further management pad admitting team.   4. Acute PE - CTA on admission showed acute segmental and subsegmental bilateral lower lobe pulmonary emboli. Currently on full-dose Lovenox.    For questions or updates, please contact [facility] Please consult [email or web address] for contact info under Cardiology/STEMI.  Tobias Alexander, MD 12/26/2020

## 2020-12-26 NOTE — Progress Notes (Signed)
Floor coverage overnight event  Patient admitted for acute hypoxic respiratory failure secondary to COVID-19 viral pneumonia, acute segmental and subsegmental bilateral lower lobe PE.  Notified by RN earlier tonight that telemetry called her to inform her that patient was tachycardic to the 170s. When she went into the room to check on the patient he was asymptomatic and tachycardia had resolved. No change in supplemental oxygen requirement.  -Stat EKG showing sinus rhythm, PACs, RBBB, QTC 537. T wave inversions in V2 and V3 were present on prior tracing as well but now more prominent.  In addition, showing new T wave inversions in V4 and V5. -Stat high-sensitivity troponin elevated at 2703.  Patient is not endorsing chest pain. -No documented history of CAD but does have significant risk factors. -Echo done yesterday showing LVEF 30 to 35% and severe hypokinesis of the left ventricular mid apical anterior wall, apical segment, and anteroseptal wall. -Patient is currently on therapeutic dose Lovenox for PE. -Discussed with Dr. Aron Baba from cardiology who feels that the troponin elevation is likely due to demand ischemia from Covid pneumonia and PE.  He recommends continuing therapeutic low-dose Lovenox for anticoagulation and trending troponin.  Recommends repeating echocardiogram in a few days to reassess left ventricular wall abnormalities.  If persistent, will need invasive work-up. -Magnesium 2.2, potassium 3.6.  Goal is to keep magnesium above 2 and potassium above 4 given QT prolongation on EKG.

## 2020-12-27 ENCOUNTER — Inpatient Hospital Stay (HOSPITAL_COMMUNITY): Payer: Medicare Other

## 2020-12-27 DIAGNOSIS — R748 Abnormal levels of other serum enzymes: Secondary | ICD-10-CM | POA: Diagnosis not present

## 2020-12-27 DIAGNOSIS — U071 COVID-19: Secondary | ICD-10-CM | POA: Diagnosis not present

## 2020-12-27 DIAGNOSIS — I2699 Other pulmonary embolism without acute cor pulmonale: Secondary | ICD-10-CM | POA: Diagnosis not present

## 2020-12-27 DIAGNOSIS — F0390 Unspecified dementia without behavioral disturbance: Secondary | ICD-10-CM | POA: Diagnosis not present

## 2020-12-27 DIAGNOSIS — E1140 Type 2 diabetes mellitus with diabetic neuropathy, unspecified: Secondary | ICD-10-CM | POA: Diagnosis not present

## 2020-12-27 LAB — CBC WITH DIFFERENTIAL/PLATELET
Abs Immature Granulocytes: 0 10*3/uL (ref 0.00–0.07)
Basophils Absolute: 0 10*3/uL (ref 0.0–0.1)
Basophils Relative: 0 %
Eosinophils Absolute: 0 10*3/uL (ref 0.0–0.5)
Eosinophils Relative: 0 %
HCT: 39.7 % (ref 39.0–52.0)
Hemoglobin: 14.6 g/dL (ref 13.0–17.0)
Lymphocytes Relative: 4 %
Lymphs Abs: 0.6 10*3/uL — ABNORMAL LOW (ref 0.7–4.0)
MCH: 30.4 pg (ref 26.0–34.0)
MCHC: 36.8 g/dL — ABNORMAL HIGH (ref 30.0–36.0)
MCV: 82.7 fL (ref 80.0–100.0)
Monocytes Absolute: 0.6 10*3/uL (ref 0.1–1.0)
Monocytes Relative: 4 %
Neutro Abs: 12.7 10*3/uL — ABNORMAL HIGH (ref 1.7–7.7)
Neutrophils Relative %: 92 %
Platelets: 361 10*3/uL (ref 150–400)
RBC: 4.8 MIL/uL (ref 4.22–5.81)
RDW: 15.2 % (ref 11.5–15.5)
WBC: 13.8 10*3/uL — ABNORMAL HIGH (ref 4.0–10.5)
nRBC: 0 % (ref 0.0–0.2)
nRBC: 0 /100 WBC

## 2020-12-27 LAB — GLUCOSE, CAPILLARY
Glucose-Capillary: 262 mg/dL — ABNORMAL HIGH (ref 70–99)
Glucose-Capillary: 276 mg/dL — ABNORMAL HIGH (ref 70–99)
Glucose-Capillary: 122 mg/dL — ABNORMAL HIGH (ref 70–99)
Glucose-Capillary: 109 mg/dL — ABNORMAL HIGH (ref 70–99)

## 2020-12-27 LAB — COMPREHENSIVE METABOLIC PANEL
ALT: 18 U/L (ref 0–44)
AST: 21 U/L (ref 15–41)
Albumin: 2.8 g/dL — ABNORMAL LOW (ref 3.5–5.0)
Alkaline Phosphatase: 55 U/L (ref 38–126)
Anion gap: 12 (ref 5–15)
BUN: 37 mg/dL — ABNORMAL HIGH (ref 8–23)
CO2: 21 mmol/L — ABNORMAL LOW (ref 22–32)
Calcium: 8.7 mg/dL — ABNORMAL LOW (ref 8.9–10.3)
Chloride: 105 mmol/L (ref 98–111)
Creatinine, Ser: 1.26 mg/dL — ABNORMAL HIGH (ref 0.61–1.24)
GFR, Estimated: 59 mL/min — ABNORMAL LOW (ref >60)
Glucose, Bld: 207 mg/dL — ABNORMAL HIGH (ref 70–99)
Potassium: 4 mmol/L (ref 3.5–5.1)
Sodium: 138 mmol/L (ref 135–145)
Total Bilirubin: 0.8 mg/dL (ref 0.3–1.2)
Total Protein: 6.2 g/dL — ABNORMAL LOW (ref 6.5–8.1)

## 2020-12-27 LAB — PHOSPHORUS: Phosphorus: 3.4 mg/dL (ref 2.5–4.6)

## 2020-12-27 LAB — C-REACTIVE PROTEIN: CRP: 3.4 mg/dL — ABNORMAL HIGH (ref <1.0)

## 2020-12-27 LAB — FERRITIN: Ferritin: 981 ng/mL — ABNORMAL HIGH (ref 24–336)

## 2020-12-27 LAB — MAGNESIUM: Magnesium: 2.2 mg/dL (ref 1.7–2.4)

## 2020-12-27 LAB — D-DIMER, QUANTITATIVE: D-Dimer, Quant: 7.18 ug/mL-FEU — ABNORMAL HIGH (ref 0.00–0.50)

## 2020-12-27 MED ORDER — ALLOPURINOL 300 MG PO TABS: 300.0000 mg | ORAL_TABLET | Freq: Every day | ORAL | Status: DC

## 2020-12-27 MED ORDER — FUROSEMIDE 10 MG/ML IJ SOLN
40.0000 mg | Freq: Once | INTRAMUSCULAR | Status: AC
  Administered 2020-12-27: 40 mg via INTRAVENOUS
  Filled 2020-12-27: qty 4

## 2020-12-27 MED ORDER — LINAGLIPTIN 5 MG PO TABS
5.0000 mg | ORAL_TABLET | Freq: Every day | ORAL | Status: DC
  Administered 2020-12-27 – 2020-12-30 (×4): 5 mg via ORAL
  Filled 2020-12-27 (×5): qty 1

## 2020-12-27 NOTE — Progress Notes (Signed)
Per RN patient had a coughing spell when transferring from the bed to the wheelchair.  He became bradycardic and short of breath.  O2 sats were difficult to obtain.  She placed patient on a NRB and returned patient back to bed.  Upon my arrival patient lying comfortably in bed.  He states he feels much better.  BP 121/77 SR with occasional PVCs and PACs 85  RR 31 O2 sat 96% on NRB  Placed patient on 15L HFNC.  O2 sats 90-92%  Patient tolerated well.   Dr Jerral Ralph came to bedside to assess patient.

## 2020-12-27 NOTE — Progress Notes (Signed)
RN and Nurse Tech transferring patient to wheelchair when patient began having coughing spell. Pt's breathing was unlabored but reported shortness of breath.  RN obtained o2 sats in 70s-80s.  Pt's heart rate also reading brady in the 40s and jumping up to 80s and falling back to 40s-50s.  RN palpated pulses and patien't heart rate was irregular and brady at times. Difficulty getting o2 sats on patient.  Patient had to be placed on NRB where oxygen slowly came back up to high 80s.  RN administered PRN cough medicine and patient stopped coughing shortly after.  Pt able to be weaned off the NRB and on to 15L high flow.  RN and nurse tech assisted patient back into the bed once stabilized.  RN paged MD and called rapid response.  New orders received for EKG, chest xr, and lasix.

## 2020-12-27 NOTE — Plan of Care (Signed)
  Problem: Education: Goal: Knowledge of General Education information will improve Description: Including pain rating scale, medication(s)/side effects and non-pharmacologic comfort measures Outcome: Progressing   Problem: Clinical Measurements: Goal: Diagnostic test results will improve Outcome: Progressing Goal: Respiratory complications will improve Outcome: Progressing   Problem: Activity: Goal: Risk for activity intolerance will decrease Outcome: Progressing   Problem: Safety: Goal: Ability to remain free from injury will improve Outcome: Progressing   Problem: Education: Goal: Knowledge of risk factors and measures for prevention of condition will improve Outcome: Progressing   Problem: Respiratory: Goal: Will maintain a patent airway Outcome: Progressing

## 2020-12-27 NOTE — Progress Notes (Signed)
Progress Note  Patient Name: Gary Mcneil Date of Encounter: 12/27/2020  [facility] Cardiologist: No primary care provider on file.   Subjective   Laying in bed, appears comfortable, less short of breath eating breakfast  Inpatient Medications    Scheduled Meds: . albuterol  2 puff Inhalation Q6H  . allopurinol  200 mg Oral Daily  . aspirin  81 mg Oral Daily  . atorvastatin  40 mg Oral QHS  . memantine  28 mg Oral QHS   And  . donepezil  10 mg Oral QHS  . enoxaparin (LOVENOX) injection  1 mg/kg Subcutaneous Q12H  . folic acid  1 mg Oral Daily  . insulin aspart  0-15 Units Subcutaneous TID WC  . linagliptin  5 mg Oral Daily  . losartan  100 mg Oral Daily  . metoprolol succinate  50 mg Oral Daily  . multivitamin with minerals  1 tablet Oral Daily  . pantoprazole  40 mg Oral Q1200  . predniSONE  50 mg Oral Daily  . thiamine  100 mg Oral Daily   Or  . thiamine  100 mg Intravenous Daily   Continuous Infusions: . remdesivir 100 mg in NS 100 mL 100 mg (12/27/20 0923)   PRN Meds: guaiFENesin, LORazepam **OR** LORazepam   Vital Signs    Vitals:   12/26/20 1627 12/26/20 2049 12/27/20 0300 12/27/20 0500  BP: (!) 144/106 123/89 132/87   Pulse: 96 85 92   Resp: 18 18 16    Temp: 97.6 F (36.4 C) 97.7 F (36.5 C) 97.8 F (36.6 C)   TempSrc: Oral Oral    SpO2: 93% 90% 94%   Weight:    81.4 kg  Height:        Intake/Output Summary (Last 24 hours) at 12/27/2020 1019 Last data filed at 12/27/2020 0400 Gross per 24 hour  Intake 420 ml  Output 1550 ml  Net -1130 ml   Last 3 Weights 12/27/2020 12/24/2020 02/09/2020  Weight (lbs) 179 lb 7.3 oz 177 lb 175 lb 12.8 oz  Weight (kg) 81.4 kg 80.287 kg 79.742 kg      Telemetry    Sinus rhythm, occasional PVCs, no adverse arrhythmias detected- Personally Reviewed  ECG    No new- Personally Reviewed  Physical Exam   GEN: No acute distress.  Fairly comfortable Neck: No JVD Cardiac: RRR, no murmurs, rubs, or gallops.   Respiratory: Clear to auscultation bilaterally. GI: Soft, nontender, non-distended  MS: No edema; No deformity. Neuro:  Nonfocal  Psych: Normal affect   Labs    High Sensitivity Troponin:   Recent Labs  Lab 12/24/20 0928 12/24/20 1232 12/24/20 1802 12/25/20 2305 12/26/20 0206  TROPONINIHS 59* 102* 164* 2,703* 2,412*      Chemistry Recent Labs  Lab 12/25/20 1233 12/25/20 2305 12/26/20 0206 12/27/20 0149  NA 138  --  138 138  K 3.6 3.6 3.8 4.0  CL 103  --  105 105  CO2 19*  --  18* 21*  GLUCOSE 261*  --  232* 207*  BUN 35*  --  43* 37*  CREATININE 1.25*  --  1.27* 1.26*  CALCIUM 8.8*  --  8.9 8.7*  PROT 7.0  --  6.5 6.2*  ALBUMIN 3.0*  --  3.0* 2.8*  AST 47*  --  36 21  ALT 25  --  21 18  ALKPHOS 57  --  52 55  BILITOT 0.7  --  0.8 0.8  GFRNONAA 59*  --  58* 59*  ANIONGAP 16*  --  15 12     Hematology Recent Labs  Lab 12/25/20 0200 12/26/20 0206 12/27/20 0149  WBC 4.6 12.3* 13.8*  RBC 5.07 4.88 4.80  HGB 15.1 14.9 14.6  HCT 43.0 40.4 39.7  MCV 84.8 82.8 82.7  MCH 29.8 30.5 30.4  MCHC 35.1 36.9* 36.8*  RDW 15.2 15.1 15.2  PLT 258 319 361    BNP Recent Labs  Lab 12/24/20 0928  BNP 553.8*     DDimer  Recent Labs  Lab 12/25/20 0200 12/26/20 0206 12/27/20 0149  DDIMER >20.00* 18.98* 7.18*     Radiology    ECHOCARDIOGRAM COMPLETE  Result Date: 12/25/2020    ECHOCARDIOGRAM REPORT   Patient Name:   Gary Mcneil Date of Exam: 12/25/2020 Medical Rec #:  [id number]       Height:       72.0 in Accession #:    [id number]      Weight:       177.0 lb Date of Birth:  [birth year]        BSA:          2.023 m Patient Age:    79 years        BP:           147/88 mmHg Patient Gender: M               HR:           92 bpm. Exam Location:  Inpatient Procedure: 2D Echo, Cardiac Doppler and Color Doppler Indications:    I50.31 Acute diastolic (congestive) heart failure  History:        Patient has prior history of Echocardiogram examinations, most                  recent 02/26/2020. Covid 19 positive.  Sonographer:    Roosvelt Maser RDCS Referring Phys: [phone] EKTA V PATEL  Sonographer Comments: No parasternal window and Technically difficult study due to poor echo windows. Covid 19 coughing. IMPRESSIONS  1. Left ventricular ejection fraction, by estimation, is 30 to 35%. The left ventricle has moderately decreased function. The left ventricle demonstrates global hypokinesis. There is mild left ventricular hypertrophy. Left ventricular diastolic function  could not be evaluated. There is severe hypokinesis of the left ventricular, mid-apical anterior wall, apical segment and anteroseptal wall.  2. Right ventricular systolic function is mildly reduced. The right ventricular size is normal. There is normal pulmonary artery systolic pressure.  3. The mitral valve was not assessed. No evidence of mitral valve regurgitation.  4. Tricuspid valve regurgitation is mild to moderate.  5. The aortic valve was not assessed. Aortic valve regurgitation is not visualized.  6. The inferior vena cava is dilated in size with >50% respiratory variability, suggesting right atrial pressure of 8 mmHg. Comparison(s): Changes from prior study are noted. 02/26/2020: LVEF 25-30%, global hypokinesis. FINDINGS  Left Ventricle: Left ventricular ejection fraction, by estimation, is 30 to 35%. The left ventricle has moderately decreased function. The left ventricle demonstrates global hypokinesis. Severe hypokinesis of the left ventricular, mid-apical anterior wall, apical segment and anteroseptal wall. The left ventricular internal cavity size was normal in size. There is mild left ventricular hypertrophy. Left ventricular diastolic function could not be evaluated. Right Ventricle: The right ventricular size is normal. No increase in right ventricular wall thickness. Right ventricular systolic function is mildly reduced. There is normal pulmonary artery systolic pressure. The tricuspid regurgitant velocity is  2.29  m/s, and with an assumed right atrial pressure of 8 mmHg, the estimated right ventricular systolic pressure is 29.0 mmHg. Left Atrium: Left atrial size was normal in size. Right Atrium: Right atrial size was normal in size. Pericardium: There is no evidence of pericardial effusion. Mitral Valve: The mitral valve was not assessed. No evidence of mitral valve regurgitation. Tricuspid Valve: The tricuspid valve is grossly normal. Tricuspid valve regurgitation is mild to moderate. Aortic Valve: The aortic valve was not assessed. Aortic valve regurgitation is not visualized. Pulmonic Valve: The pulmonic valve was not assessed. Pulmonic valve regurgitation is not visualized. Aorta: Aortic root could not be assessed. Venous: The inferior vena cava is dilated in size with greater than 50% respiratory variability, suggesting right atrial pressure of 8 mmHg. IAS/Shunts: No atrial level shunt detected by color flow Doppler.  IVC IVC diam: 2.10 cm LEFT ATRIUM           Index       RIGHT ATRIUM           Index LA Vol (A4C): 43.7 ml 21.60 ml/m RA Area:     12.20 cm                                   RA Volume:   30.90 ml  15.28 ml/m  TRICUSPID VALVE TR Peak grad:   21.0 mmHg TR Vmax:        229.00 cm/s Zoila Shutter MD Electronically signed by Zoila Shutter MD Signature Date/Time: 12/25/2020/4:34:18 PM    Final    VAS [place] LOWER EXTREMITY VENOUS (DVT)  Result Date: 12/26/2020  Lower Venous DVT Study Indications: Covid-19, elevated D-Dimer.  Limitations: Body habitus. Comparison Study: Prior negative left LEV duplex is available from 04/18/12 Performing Technologist: Sherren Kerns RVS  Examination Guidelines: A complete evaluation includes B-mode imaging, spectral Doppler, color Doppler, and power Doppler as needed of all accessible portions of each vessel. Bilateral testing is considered an integral part of a complete examination. Limited examinations for reoccurring indications may be performed as noted. The reflux  portion of the exam is performed with the patient in reverse Trendelenburg.  +---------+---------------+---------+-----------+----------+-------------------+ RIGHT    CompressibilityPhasicitySpontaneityPropertiesThrombus Aging      +---------+---------------+---------+-----------+----------+-------------------+ CFV      Full           Yes      Yes                  sluggish flow noted +---------+---------------+---------+-----------+----------+-------------------+ SFJ      Full                                                             +---------+---------------+---------+-----------+----------+-------------------+ FV Prox  Full                                         sluggish flow noted +---------+---------------+---------+-----------+----------+-------------------+ FV Mid   Full                                                             +---------+---------------+---------+-----------+----------+-------------------+  FV DistalFull                                                             +---------+---------------+---------+-----------+----------+-------------------+ PFV      Full                                         sluggish flow noted +---------+---------------+---------+-----------+----------+-------------------+ POP      Full                                                             +---------+---------------+---------+-----------+----------+-------------------+ PTV                                                   patent by color     +---------+---------------+---------+-----------+----------+-------------------+ PERO                                                  patent by color     +---------+---------------+---------+-----------+----------+-------------------+   +---------+---------------+---------+-----------+----------+---------------+ LEFT     CompressibilityPhasicitySpontaneityPropertiesThrombus Aging   +---------+---------------+---------+-----------+----------+---------------+ CFV      Full           Yes      Yes                  sluggish flow   +---------+---------------+---------+-----------+----------+---------------+ SFJ      Full                                                         +---------+---------------+---------+-----------+----------+---------------+ FV Prox  Full                                                         +---------+---------------+---------+-----------+----------+---------------+ FV Mid   Full                                                         +---------+---------------+---------+-----------+----------+---------------+ FV DistalFull                                                         +---------+---------------+---------+-----------+----------+---------------+  PFV      Full                                                         +---------+---------------+---------+-----------+----------+---------------+ POP                     Yes      Yes                  patent by color +---------+---------------+---------+-----------+----------+---------------+ PTV      Full                                                         +---------+---------------+---------+-----------+----------+---------------+ PERO     Full                                                         +---------+---------------+---------+-----------+----------+---------------+     Summary: BILATERAL: - No evidence of deep vein thrombosis seen in the lower extremities, bilaterally. -   *See table(s) above for measurements and observations. Electronically signed by Sherald Hesshristopher Clark MD on 12/26/2020 at 2:28:21 PM.    Final     Cardiac Studies   Echo this admission shows EF 30 to 35% with RV function mildly reduced.  Fairly similar to prior.  Patient Profile     80 y.o. male with Covid pneumonia, pulmonary embolism, non-ST elevation myocardial  infarction  Assessment & Plan    Non-ST elevation myocardial infarction -Agree with Dr. Delton SeeNelson, no invasive work-up at this time. -Likely a demand process in the setting of Covid pneumonia as well as pulmonary emboli.  Of course this does increase his overall morbidity mortality. -No chest pain no anginal symptoms currently.  Continue with aggressive secondary risk factor prevention. -Once he is on Eliquis, watch for bleeding with concomitant aspirin. -Continue with Toprol.  Chronic systolic heart failure -EF 30 to 35%, NYHA class II. -Continue with current goal-directed medical therapy.  Diabetes mellitus -Consider SGLT2 inhibitor such as Jardiance considering his systolic heart failure  Pulmonary hypertension -Likely secondary from chronic systolic heart failure.  Pulmonary arteries were noted to be enlarged on CT scan.  Pulmonary embolism may have been playing a role as well.  Acute pulmonary embolism -Agree with transition to Eliquis soon.  Currently on Lovenox.  Acute subsegment mental and segmental bilateral lower lobe emboli.  Covid pneumonia -Remdesivir.  Dr. Windell NorfolkGhimire's note reviewed.       For questions or updates, please contact [facility] Please consult [email or web address] for contact info under        Signed, Donato SchultzMark Bodey Frizell, MD  12/27/2020, 10:19 AM

## 2020-12-27 NOTE — Progress Notes (Signed)
RNs transferred patient up to 5W on 6L high flow. 5W nurse updated and patient resting comfortably.  Night shift nurse made aware day shift had not had time to get EKG yet and still needs done.

## 2020-12-27 NOTE — Progress Notes (Signed)
Informed by RN that when patient was moved from bed to wheelchair (was being transferred to 5 W.) he had a coughing spell-following which he became short of breath and became transiently bradycardic.  He was placed on a nonrebreather mask.    On my arrival-he was already weaned to 15 L HFNC-and was lying very comfortably in bed.  Lungs-few bibasilar rales CVS: S1-S2 regular Extremities: No edema  While I was in the room-he was weaned down to 6-7 L of HFNC.  He continues to be very comfortable and not in any sort of distress.  Impression/plan 1) worsening hypoxemia-?  Bronchospasm from coughing-seems to be improving spontaneously.  However has chronic systolic heart failure-has had a type II non-STEMI.  Although no obvious signs of volume overload-we will give 1 dose of Lasix 40 mg IV x1.  Awaiting chest x-ray.  We will continue to monitor.  I will sign out to our night shift/flow coverage as well.

## 2020-12-27 NOTE — Progress Notes (Signed)
Inpatient Diabetes Program Recommendations  AACE/ADA: New Consensus Statement on Inpatient Glycemic Control (2015)  Target Ranges:  Prepandial:   less than 140 mg/dL      Peak postprandial:   less than 180 mg/dL (1-2 hours)      Critically ill patients:  140 - 180 mg/dL   Lab Results  Component Value Date   GLUCAP 276 (H) 12/27/2020   HGBA1C 7.0 (H) 12/24/2020    Review of Glycemic Control Results for Gary Mcneil, Gary Mcneil (MRN [id number]) as of 12/27/2020 12:44  Ref. Range 12/26/2020 12:11 12/26/2020 16:27 12/26/2020 20:47 12/27/2020 07:57 12/27/2020 12:38  Glucose-Capillary Latest Ref Range: 70 - 99 mg/dL 778 (H) 242 (H) 353 (H) 262 (H) 276 (H)   Diabetes history:  DM2 Outpatient Diabetes medications:  Tradjenta 5 daily Metformin 500 mg daily Current orders for Inpatient glycemic control:  Novolog 0-15 units TID  Tradjenta 5 mg daily Prednisone 50 daily (tapering steroids)  Inpatient Diabetes Program Recommendations:    If appropriate, might consider adding Levemir 6 units BID  Will continue to follow while inpatient.  Thank you, Dulce Sellar, RN, BSN Diabetes Coordinator Inpatient Diabetes Program [phone] (team pager from 8a-5p)

## 2020-12-27 NOTE — Progress Notes (Signed)
Per MD and rapid response, patient stable to transfer to 5W at this time.  Pt currently weaned to 6L high flow oxygen and resting comfortably.  RN updated 5W RN on patient's condition.

## 2020-12-27 NOTE — Progress Notes (Signed)
PROGRESS NOTE                                                                                                                                                                                                             Patient Demographics:    Gary Mcneil, is a 80 y.o. male, DOB - 12-24-1940, QBH:[id number]  Outpatient Primary MD for the patient is [place], MD   Admit date - 12/24/2020   LOS - 3  Chief Complaint  Patient presents with  . Shortness of Breath       Brief Narrative: Patient is a 80 y.o. male with PMHx of dementia, HTN, DM-2, chronic systolic heart failure-who presented with 1 week history of shortness of breath-found to have acute hypoxic respiratory failure-due to COVID-19 pneumonia and PE and subsequently admitted to the hospitalist service.  COVID-19 vaccinated status: Vaccinated.  Significant Events: 2/5>> Admit to [facility] for hypoxia due to COVID-19 pneumonia  Significant studies: 12/24/2020>> CTA chest: Segmental and subsegmental bilateral lower lobe emboli, multifocal pneumonia 12/25/2020>> Echo: EF 30-35%, RV systolic function mildly reduced. 12/25/2020>> lower extremity Doppler: No DVT 02/26/2020>> Echo: EF 25-30%, right RV systolic function mildly reduced-RVSP 31.5  COVID-19 medications: Steroids: 2/4>> Remdesivir: 2/4>>  Antibiotics: None  Microbiology data: None  Procedures: None  Consults: None  DVT prophylaxis: SCDs Start: 12/24/20 1354 Lovenox at therapeutic dosing    Subjective:   Significantly better-on room air.  Eating breakfast this morning.  Per nursing staff-continues to have some mild intermittent confusion.   Assessment  & Plan :   Acute Hypoxic Resp Failure due to Covid 19 Viral pneumonia and pulmonary embolism: Hypoxia resolved-on room air this morning-continue to taper steroids-he remains on Remdesivir.  On anticoagulation with Lovenox-with plans to switch  to Eliquis soon.  Fever: afebrile O2 requirements:  SpO2: 94 % O2 Flow Rate (L/min): 2 L/min FiO2 (%): 40 %   COVID-19 Labs: Recent Labs    12/24/20 1802 12/25/20 0200 12/26/20 0206 12/27/20 0149  DDIMER >20.00* >20.00* 18.98* 7.18*  FERRITIN 790* 819* 834* 981*  LDH 290*  --   --   --   CRP 14.0* 14.5* 7.1* 3.4*       Component Value Date/Time   BNP 553.8 (H) 12/24/2020 0928    Recent Labs  Lab 12/24/20 1802  PROCALCITON <0.10  Lab Results  Component Value Date   SARSCOV2NAA POSITIVE (A) 12/24/2020     Prone/Incentive Spirometry: Confused-unlikely will be able to follow commands for incentive spirometry.  Pulmonary embolism: Provoked by COVID-19-remains on therapeutic anticoagulation-lower extremity Dopplers negative for DVT-echo with mildly reduced RV-which probably is chronic.  Non-STEMI-type II: Secondary demand ischemia-appreciate cardiology input-no further work-up recommended-on aspirin/statin/beta-blocker.   Chronic systolic heart failure: Volume status stable  Pulmonary hypertension: CT chest shows enlarged pulmonary artery-prior echo with findings of pulmonary hypertension.    EtOH use: Family acknowledges significant alcohol use-no signs of alcohol withdrawal-on Ativan per CIWA protocol.  HTN: BP stable-continue losartan  HLD: Continue statin-watch LFTs  Gout: No flare-continue allopurinol  DM-2: CBG stable with SSI-as steroid dosage gets tapered down-suspect will improve further.  Resuming home dose Tradjenta.  Recent Labs    12/26/20 1627 12/26/20 2047 12/27/20 0757  GLUCAP 277* 244* 262*   Dementia: Mildly confused-expect delirium during this hospital stay-maintain delirium precautions-continue Aricept/Namenda  GERD: Continue PPI  GI prophylaxis: PPI  ABG:    Component Value Date/Time   PHART 7.408 05/01/2015 0406   PCO2ART 37.3 05/01/2015 0406   PO2ART 77.0 (L) 05/01/2015 0406   HCO3 23.4 12/24/2020 0940   TCO2 25  12/24/2020 0940   ACIDBASEDEF 2.0 12/24/2020 0940   O2SAT 59.0 12/24/2020 0940    Vent Settings:     Condition - Extremely Guarded-  Family Communication  :Rony Ratz [phone] updated over the phone on 2/7  Code Status :  Full Code  Diet :  Diet Order            Diet Heart Room service appropriate? Yes; Fluid consistency: Thin  Diet effective now                  Disposition Plan  :   Status is: Inpatient  Remains inpatient appropriate because:Inpatient level of care appropriate due to severity of illness   Dispo: The patient is from: Home              Anticipated d/c is to: Home              Anticipated d/c date is: > 3 days              Patient currently is not medically stable to d/c.   Difficult to place patient No     Barriers to discharge: Hypoxia requiring O2 supplementation/complete 5 days of IV Remdesivir  Antimicorbials  :    Anti-infectives (From admission, onward)   Start     Dose/Rate Route Frequency Ordered Stop   12/25/20 1000  remdesivir 100 mg in sodium chloride 0.9 % 100 mL IVPB       "Followed by" Linked Group Details   100 mg 200 mL/hr over 30 Minutes Intravenous Daily 12/24/20 1354 12/29/20 0959   12/24/20 1353  remdesivir 200 mg in sodium chloride 0.9% 250 mL IVPB       "Followed by" Linked Group Details   200 mg 580 mL/hr over 30 Minutes Intravenous Once 12/24/20 1354 12/24/20 1821      Inpatient Medications  Scheduled Meds: . albuterol  2 puff Inhalation Q6H  . allopurinol  200 mg Oral Daily  . aspirin  81 mg Oral Daily  . atorvastatin  40 mg Oral QHS  . memantine  28 mg Oral QHS   And  . donepezil  10 mg Oral QHS  . enoxaparin (LOVENOX) injection  1 mg/kg Subcutaneous Q12H  . folic acid  1 mg Oral Daily  . insulin aspart  0-15 Units Subcutaneous TID WC  . losartan  100 mg Oral Daily  . metoprolol succinate  50 mg Oral Daily  . multivitamin with minerals  1 tablet Oral Daily  . pantoprazole  40 mg Oral Q1200   . predniSONE  50 mg Oral Daily  . thiamine  100 mg Oral Daily   Or  . thiamine  100 mg Intravenous Daily   Continuous Infusions: . remdesivir 100 mg in NS 100 mL 100 mg (12/27/20 0923)   PRN Meds:.guaiFENesin, LORazepam **OR** LORazepam   Time Spent in minutes  35   See all Orders from today for further details   Jeoffrey Massed M.D on 12/27/2020 at 9:55 AM  To page go to [email or web address] - use universal password  [facility] -  Office  [phone]    Objective:   Vitals:   12/26/20 1627 12/26/20 2049 12/27/20 0300 12/27/20 0500  BP: (!) 144/106 123/89 132/87   Pulse: 96 85 92   Resp: 18 18 16    Temp: 97.6 F (36.4 C) 97.7 F (36.5 C) 97.8 F (36.6 C)   TempSrc: Oral Oral    SpO2: 93% 90% 94%   Weight:    81.4 kg  Height:        Wt Readings from Last 3 Encounters:  12/27/20 81.4 kg  02/09/20 79.7 kg  11/20/19 80.3 kg     Intake/Output Summary (Last 24 hours) at 12/27/2020 0955 Last data filed at 12/27/2020 0400 Gross per 24 hour  Intake 420 ml  Output 1550 ml  Net -1130 ml     Physical Exam Gen Exam:Alert awake-not in any distress HEENT:atraumatic, normocephalic Chest: B/L clear to auscultation anteriorly CVS:S1S2 regular Abdomen:soft non tender, non distended Extremities:no edema Neurology: Non focal Skin: no rash   Data Review:    CBC Recent Labs  Lab 12/24/20 0928 12/24/20 0940 12/24/20 1802 12/25/20 0200 12/26/20 0206 12/27/20 0149  WBC 8.3  --  6.3 4.6 12.3* 13.8*  HGB 15.8 15.6 14.7 15.1 14.9 14.6  HCT 44.9 46.0 41.7 43.0 40.4 39.7  PLT 271  --  233 258 319 361  MCV 85.2  --  84.4 84.8 82.8 82.7  MCH 30.0  --  29.8 29.8 30.5 30.4  MCHC 35.2  --  35.3 35.1 36.9* 36.8*  RDW 15.0  --  14.9 15.2 15.1 15.2  LYMPHSABS 1.4  --   --  0.8 1.1 0.6*  MONOABS 0.5  --   --  0.1 0.6 0.6  EOSABS 0.1  --   --  0.0 0.0 0.0  BASOSABS 0.1  --   --  0.0 0.1 0.0    Chemistries  Recent Labs  Lab 12/24/20 0928 12/24/20 0940 12/24/20 1802  12/25/20 0200 12/25/20 0712 12/25/20 1233 12/25/20 2305 12/26/20 0206 12/27/20 0149  NA 139 141  --   --  141 138  --  138 138  K 3.2* 3.0*  --   --  3.7 3.6 3.6 3.8 4.0  CL 104  --   --   --  105 103  --  105 105  CO2 20*  --   --   --  21* 19*  --  18* 21*  GLUCOSE 165*  --   --   --  213* 261*  --  232* 207*  BUN 26*  --   --   --  27* 35*  --  43* 37*  CREATININE 1.13  --  1.05  --  1.09 1.25*  --  1.27* 1.26*  CALCIUM 9.0  --   --   --  8.7* 8.8*  --  8.9 8.7*  MG  --   --   --  2.2  --   --  2.2 2.2 2.2  AST 48*  --   --   --  33 47*  --  36 21  ALT 24  --   --   --  22 25  --  21 18  ALKPHOS 64  --   --   --  60 57  --  52 55  BILITOT 1.4*  --   --   --  1.1 0.7  --  0.8 0.8   ------------------------------------------------------------------------------------------------------------------ No results for input(s): CHOL, HDL, LDLCALC, TRIG, CHOLHDL, LDLDIRECT in the last 72 hours.  Lab Results  Component Value Date   HGBA1C 7.0 (H) 12/24/2020   ------------------------------------------------------------------------------------------------------------------ No results for input(s): TSH, T4TOTAL, T3FREE, THYROIDAB in the last 72 hours.  Invalid input(s): FREET3 ------------------------------------------------------------------------------------------------------------------ Recent Labs    12/26/20 0206 12/27/20 0149  FERRITIN 834* 981*    Coagulation profile No results for input(s): INR, PROTIME in the last 168 hours.  Recent Labs    12/26/20 0206 12/27/20 0149  DDIMER 18.98* 7.18*    Cardiac Enzymes No results for input(s): CKMB, TROPONINI, MYOGLOBIN in the last 168 hours.  Invalid input(s): CK ------------------------------------------------------------------------------------------------------------------    Component Value Date/Time   BNP 553.8 (H) 12/24/2020 1610    Micro Results Recent Results (from the past 240 hour(s))  SARS Coronavirus 2 by  RT PCR (hospital order, performed in [facility] hospital lab) Nasopharyngeal Nasopharyngeal Swab     Status: Abnormal   Collection Time: 12/24/20  9:20 AM   Specimen: Nasopharyngeal Swab  Result Value Ref Range Status   SARS Coronavirus 2 POSITIVE (A) NEGATIVE Final    Comment: RESULT CALLED TO, READ BACK BY AND VERIFIED WITH: RN S.KLONKET AT 1153 ON 12/24/2020 BY T.SAAD (NOTE) SARS-CoV-2 target nucleic acids are DETECTED  SARS-CoV-2 RNA is generally detectable in upper respiratory specimens  during the acute phase of infection.  Positive results are indicative  of the presence of the identified virus, but do not rule out bacterial infection or co-infection with other pathogens not detected by the test.  Clinical correlation with patient history and  other diagnostic information is necessary to determine patient infection status.  The expected result is negative.  Fact Sheet for Patients:   BoilerBrush.com.cy   Fact Sheet for Healthcare Providers:   https://pope.com/    This test is not yet approved or cleared by the [place] FDA and  has been authorized for detection and/or diagnosis of SARS-CoV-2 by FDA under an Emergency Use Authorization (EUA).  This EUA will remain in effect (meani ng this test can be used) for the duration of  the COVID-19 declaration under Section 564(b)(1) of the Act, 21 U.S.C. section 360-bbb-3(b)(1), unless the authorization is terminated or revoked sooner.  Performed at [facility], [address], [place], [place] [address]     Radiology Reports CT Angio Chest PE W and/or Wo Contrast  Addendum Date: 12/24/2020   ADDENDUM REPORT: 12/24/2020 13:30 ADDENDUM: Critical Value/emergent results were called by telephone at the time of interpretation on 12/24/2020 at 1:24 pm to provider [facility] , who verbally acknowledged these results. Electronically Signed   By: Delbert Phenix M.D.   On:  12/24/2020 13:30   Result Date: 12/24/2020  CLINICAL DATA:  COVID positive.  Hypoxia.  Dyspnea.  Tachycardia. EXAM: CT ANGIOGRAPHY CHEST WITH CONTRAST TECHNIQUE: Multidetector CT imaging of the chest was performed using the standard protocol during bolus administration of intravenous contrast. Multiplanar CT image reconstructions and MIPs were obtained to evaluate the vascular anatomy. CONTRAST:  5mL OMNIPAQUE IOHEXOL 350 MG/ML SOLN COMPARISON:  Chest radiograph from earlier today. 04/20/2015 chest CT angiogram. FINDINGS: Cardiovascular: The study is moderate quality for the evaluation of pulmonary embolism, with some motion degradation. Acute segmental and subsegmental anterior left lower lobe pulmonary emboli (series 6/image 161). Tiny subsegmental medial right lower lobe pulmonary embolus (series 6/image 188). No central pulmonary emboli. Atherosclerotic nonaneurysmal thoracic aorta. Markedly dilated main pulmonary artery (4.9 cm diameter), unchanged. Normal heart size. No significant pericardial fluid/thickening. Three-vessel coronary atherosclerosis. Mediastinum/Nodes: No discrete thyroid nodules. Unremarkable esophagus. No pathologically enlarged axillary, mediastinal or hilar lymph nodes. Lungs/Pleura: No pneumothorax. No pleural effusion. Moderate centrilobular and paraseptal emphysema. Extensive patchy ground-glass opacity throughout both lungs involving all lung lobes, new. No lung masses or significant pulmonary nodules. Upper abdomen: Simple 2.5 cm medial upper left renal cyst. Musculoskeletal: No aggressive appearing focal osseous lesions. Mild thoracic spondylosis. Review of the MIP images confirms the above findings. IMPRESSION: 1. Acute segmental and subsegmental bilateral lower lobe pulmonary emboli. No central pulmonary emboli. 2. Extensive patchy ground-glass opacity throughout both lungs involving all lung lobes, new, compatible with COVID-19 pneumonia. 3. Markedly dilated main pulmonary artery,  unchanged, suggesting chronic pulmonary arterial hypertension. 4. Three-vessel coronary atherosclerosis. 5. Aortic Atherosclerosis (ICD10-I70.0) and Emphysema (ICD10-J43.9). Electronically Signed: By: Delbert Phenix M.D. On: 12/24/2020 13:28   DG Chest Portable 1 View  Result Date: 12/24/2020 CLINICAL DATA:  Shortness of breath. EXAM: PORTABLE CHEST 1 VIEW COMPARISON:  May 26, 2015. FINDINGS: The heart size and mediastinal contours are within normal limits. Both lungs are clear. No pneumothorax or pleural effusion is noted. The visualized skeletal structures are unremarkable. IMPRESSION: No active disease. Electronically Signed   By: Lupita Raider M.D.   On: 12/24/2020 09:46   ECHOCARDIOGRAM COMPLETE  Result Date: 12/25/2020    ECHOCARDIOGRAM REPORT   Patient Name:   Gary Mcneil Date of Exam: 12/25/2020 Medical Rec #:  [id number]       Height:       72.0 in Accession #:    [id number]      Weight:       177.0 lb Date of Birth:  [birth year]        BSA:          2.023 m Patient Age:    79 years        BP:           147/88 mmHg Patient Gender: M               HR:           92 bpm. Exam Location:  Inpatient Procedure: 2D Echo, Cardiac Doppler and Color Doppler Indications:    I50.31 Acute diastolic (congestive) heart failure  History:        Patient has prior history of Echocardiogram examinations, most                 recent 02/26/2020. Covid 19 positive.  Sonographer:    Roosvelt Maser RDCS Referring Phys: [phone] EKTA V PATEL  Sonographer Comments: No parasternal window and Technically difficult study due to poor echo windows. Covid 19 coughing. IMPRESSIONS  1. Left ventricular ejection fraction, by estimation, is  30 to 35%. The left ventricle has moderately decreased function. The left ventricle demonstrates global hypokinesis. There is mild left ventricular hypertrophy. Left ventricular diastolic function  could not be evaluated. There is severe hypokinesis of the left ventricular, mid-apical anterior wall, apical  segment and anteroseptal wall.  2. Right ventricular systolic function is mildly reduced. The right ventricular size is normal. There is normal pulmonary artery systolic pressure.  3. The mitral valve was not assessed. No evidence of mitral valve regurgitation.  4. Tricuspid valve regurgitation is mild to moderate.  5. The aortic valve was not assessed. Aortic valve regurgitation is not visualized.  6. The inferior vena cava is dilated in size with >50% respiratory variability, suggesting right atrial pressure of 8 mmHg. Comparison(s): Changes from prior study are noted. 02/26/2020: LVEF 25-30%, global hypokinesis. FINDINGS  Left Ventricle: Left ventricular ejection fraction, by estimation, is 30 to 35%. The left ventricle has moderately decreased function. The left ventricle demonstrates global hypokinesis. Severe hypokinesis of the left ventricular, mid-apical anterior wall, apical segment and anteroseptal wall. The left ventricular internal cavity size was normal in size. There is mild left ventricular hypertrophy. Left ventricular diastolic function could not be evaluated. Right Ventricle: The right ventricular size is normal. No increase in right ventricular wall thickness. Right ventricular systolic function is mildly reduced. There is normal pulmonary artery systolic pressure. The tricuspid regurgitant velocity is 2.29 m/s, and with an assumed right atrial pressure of 8 mmHg, the estimated right ventricular systolic pressure is 29.0 mmHg. Left Atrium: Left atrial size was normal in size. Right Atrium: Right atrial size was normal in size. Pericardium: There is no evidence of pericardial effusion. Mitral Valve: The mitral valve was not assessed. No evidence of mitral valve regurgitation. Tricuspid Valve: The tricuspid valve is grossly normal. Tricuspid valve regurgitation is mild to moderate. Aortic Valve: The aortic valve was not assessed. Aortic valve regurgitation is not visualized. Pulmonic Valve: The  pulmonic valve was not assessed. Pulmonic valve regurgitation is not visualized. Aorta: Aortic root could not be assessed. Venous: The inferior vena cava is dilated in size with greater than 50% respiratory variability, suggesting right atrial pressure of 8 mmHg. IAS/Shunts: No atrial level shunt detected by color flow Doppler.  IVC IVC diam: 2.10 cm LEFT ATRIUM           Index       RIGHT ATRIUM           Index LA Vol (A4C): 43.7 ml 21.60 ml/m RA Area:     12.20 cm                                   RA Volume:   30.90 ml  15.28 ml/m  TRICUSPID VALVE TR Peak grad:   21.0 mmHg TR Vmax:        229.00 cm/s Zoila ShutterKenneth Hilty MD Electronically signed by Zoila ShutterKenneth Hilty MD Signature Date/Time: 12/25/2020/4:34:18 PM    Final    VAS US LOWER EXTREMITY VENOUS (DVT)  Result Date: 12/26/2020  Lower Venous DVT Study Indications: Covid-19, elevated D-Dimer.  Limitations: Body habitus. Comparison Study: Prior negative left LEV duplex is available from 04/18/12 Performing Technologist: Sherren Kernsandace Kanady RVS  Examination Guidelines: A complete evaluation includes B-mode imaging, spectral Doppler, color Doppler, and power Doppler as needed of all accessible portions of each vessel. Bilateral testing is considered an integral part of a complete examination. Limited examinations for reoccurring indications  may be performed as noted. The reflux portion of the exam is performed with the patient in reverse Trendelenburg.  +---------+---------------+---------+-----------+----------+-------------------+ RIGHT    CompressibilityPhasicitySpontaneityPropertiesThrombus Aging      +---------+---------------+---------+-----------+----------+-------------------+ CFV      Full           Yes      Yes                  sluggish flow noted +---------+---------------+---------+-----------+----------+-------------------+ SFJ      Full                                                              +---------+---------------+---------+-----------+----------+-------------------+ FV Prox  Full                                         sluggish flow noted +---------+---------------+---------+-----------+----------+-------------------+ FV Mid   Full                                                             +---------+---------------+---------+-----------+----------+-------------------+ FV DistalFull                                                             +---------+---------------+---------+-----------+----------+-------------------+ PFV      Full                                         sluggish flow noted +---------+---------------+---------+-----------+----------+-------------------+ POP      Full                                                             +---------+---------------+---------+-----------+----------+-------------------+ PTV                                                   patent by color     +---------+---------------+---------+-----------+----------+-------------------+ PERO                                                  patent by color     +---------+---------------+---------+-----------+----------+-------------------+   +---------+---------------+---------+-----------+----------+---------------+ LEFT     CompressibilityPhasicitySpontaneityPropertiesThrombus Aging  +---------+---------------+---------+-----------+----------+---------------+ CFV      Full           Yes      Yes  sluggish flow   +---------+---------------+---------+-----------+----------+---------------+ SFJ      Full                                                         +---------+---------------+---------+-----------+----------+---------------+ FV Prox  Full                                                         +---------+---------------+---------+-----------+----------+---------------+ FV Mid   Full                                                          +---------+---------------+---------+-----------+----------+---------------+ FV DistalFull                                                         +---------+---------------+---------+-----------+----------+---------------+ PFV      Full                                                         +---------+---------------+---------+-----------+----------+---------------+ POP                     Yes      Yes                  patent by color +---------+---------------+---------+-----------+----------+---------------+ PTV      Full                                                         +---------+---------------+---------+-----------+----------+---------------+ PERO     Full                                                         +---------+---------------+---------+-----------+----------+---------------+     Summary: BILATERAL: - No evidence of deep vein thrombosis seen in the lower extremities, bilaterally. -   *See table(s) above for measurements and observations. Electronically signed by Sherald Hess MD on 12/26/2020 at 2:28:21 PM.    Final

## 2020-12-27 NOTE — TOC Initial Note (Signed)
Transition of Care [facility]) - Initial/Assessment Note    Patient Details  Name: Gary Mcneil MRN: [id number] Date of Birth: 01/06/1941  Transition of Care [facility] CM/SW Contact:    Beckie Busing, RN Phone Number: [phone]  12/27/2020, 2:42 PM  Clinical Narrative:                 [facility] consulted for SNF vs HH needs. CM called patient on phone. Patient states that he does not want to go to a rehab facility. Patient states I dont need it. Patient states that he takes care of himself at home and he does not need any help now. Patient states he is independent and just wants to get back home with his dog. Patient gives CM permission to speak with his son but states that he is not going to a rehab facility. CM has attempted to call [name] but no answer. Message left for son to call CM.   Expected Discharge Plan: Home w Home Health Services Barriers to Discharge: Continued Medical Work up   Patient Goals and CMS Choice Patient states their goals for this hospitalization and ongoing recovery are:: Just wants to go home CMS Medicare.gov Compare Post Acute Care list provided to:: Patient Choice offered to / list presented to : Patient  Expected Discharge Plan and Services Expected Discharge Plan: Home w Home Health Services In-house Referral: NA Discharge Planning Services: CM Consult   Living arrangements for the past 2 months: Single Family Home                 DME Arranged: N/A DME Agency: NA       HH Arranged: NA HH Agency: NA        Prior Living Arrangements/Services Living arrangements for the past 2 months: Single Family Home Lives with:: Self Patient language and need for interpreter reviewed:: Yes Do you feel safe going back to the place where you live?: Yes      Need for Family Participation in Patient Care: Yes (Comment) Care giver support system in place?: Yes (comment) Current home services: Other (comment) (n/a) Criminal Activity/Legal Involvement Pertinent to  Current Situation/Hospitalization: No - Comment as needed  Activities of Daily Living      Permission Sought/Granted Permission sought to share information with : Family Supports Permission granted to share information with : Yes, Verbal Permission Granted  Share Information with NAME: Gary Mcneil     Permission granted to share info w Relationship: son     Emotional Assessment Appearance:: Other (Comment Required (n/a) Attitude/Demeanor/Rapport: Unable to Assess Affect (typically observed): Unable to Assess Orientation: : Oriented to Self,Oriented to Place,Oriented to Situation,Oriented to  Time Alcohol / Substance Use: Not Applicable Psych Involvement: No (comment)  Admission diagnosis:  Hypokalemia [E87.6] Hypoxia [R09.02] Acute dyspnea [R06.00] Acute pulmonary embolism without acute cor pulmonale, unspecified pulmonary embolism type (HCC) [I26.99] Acute hypoxemic respiratory failure due to COVID-19 (HCC) [U07.1, J96.01] Pneumonia due to COVID-19 virus [U07.1, J12.82] Patient Active Problem List   Diagnosis Date Noted  . Acute hypoxemic respiratory failure due to COVID-19 (HCC) 12/24/2020  . Pulmonary embolism (HCC) 12/24/2020  . Tobacco use disorder   . Hypertension   . GERD (gastroesophageal reflux disease)   . Acute kidney failure, unspecified (HCC)   . 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 Empyema lung (HCC) 04/30/2015  . Loculated pleural effusion   . Hyperglycemia   . Right lower lobe lung mass   . Lung mass 04/24/2015  .  Lethargy   . Essential hypertension   . HLD (hyperlipidemia)   . Right-sided chest pain 04/20/2015  . Transaminitis 04/20/2015  . Positive D dimer 04/20/2015  . Pleural effusion, right 04/20/2015  . Hypokalemia 04/20/2015  . Dementia without behavioral disturbance (HCC) 09/02/2014  . Type 2 diabetes mellitus with diabetic neuropathy (HCC) 05/20/2014  . Gout 04/29/2014  . Elevated liver enzymes 01/27/2014  . Anemia 05/02/2012  .  Constipation 04/24/2012  . Dementia (HCC) 04/24/2012  . Arthritis   . HTN (hypertension) 04/13/2012  . Hyperlipidemia 04/13/2012   PCP:  [place], MD Pharmacy:   [facility] [phone] [address] [address], [place] - [phone] AT [facility] & [address] [place] [address] Phone: [phone] Fax: [phone]     Social Determinants of Health (SDOH) Interventions    Readmission Risk Interventions Readmission Risk Prevention Plan 12/27/2020  Transportation Screening Complete  PCP or Specialist Appt within 5-7 Days Complete  Home Care Screening Complete  Medication Review (RN CM) Referral to Pharmacy  Some recent data might be hidden

## 2020-12-28 DIAGNOSIS — R748 Abnormal levels of other serum enzymes: Secondary | ICD-10-CM | POA: Diagnosis not present

## 2020-12-28 DIAGNOSIS — F0390 Unspecified dementia without behavioral disturbance: Secondary | ICD-10-CM | POA: Diagnosis not present

## 2020-12-28 DIAGNOSIS — U071 COVID-19: Secondary | ICD-10-CM | POA: Diagnosis not present

## 2020-12-28 DIAGNOSIS — I2699 Other pulmonary embolism without acute cor pulmonale: Secondary | ICD-10-CM | POA: Diagnosis not present

## 2020-12-28 LAB — CBC WITH DIFFERENTIAL/PLATELET
Abs Immature Granulocytes: 0.11 10*3/uL — ABNORMAL HIGH (ref 0.00–0.07)
Basophils Absolute: 0 10*3/uL (ref 0.0–0.1)
Basophils Relative: 0 %
Eosinophils Absolute: 0.6 10*3/uL — ABNORMAL HIGH (ref 0.0–0.5)
Eosinophils Relative: 6 %
HCT: 44.5 % (ref 39.0–52.0)
Hemoglobin: 16.5 g/dL (ref 13.0–17.0)
Immature Granulocytes: 1 %
Lymphocytes Relative: 8 %
Lymphs Abs: 0.9 10*3/uL (ref 0.7–4.0)
MCH: 30.6 pg (ref 26.0–34.0)
MCHC: 37.1 g/dL — ABNORMAL HIGH (ref 30.0–36.0)
MCV: 82.4 fL (ref 80.0–100.0)
Monocytes Absolute: 0.6 10*3/uL (ref 0.1–1.0)
Monocytes Relative: 5 %
Neutro Abs: 8.7 10*3/uL — ABNORMAL HIGH (ref 1.7–7.7)
Neutrophils Relative %: 80 %
Platelets: 394 10*3/uL (ref 150–400)
RBC: 5.4 MIL/uL (ref 4.22–5.81)
RDW: 15.5 % (ref 11.5–15.5)
WBC: 10.9 10*3/uL — ABNORMAL HIGH (ref 4.0–10.5)
nRBC: 0 % (ref 0.0–0.2)

## 2020-12-28 LAB — COMPREHENSIVE METABOLIC PANEL
ALT: 20 U/L (ref 0–44)
AST: 21 U/L (ref 15–41)
Albumin: 3.1 g/dL — ABNORMAL LOW (ref 3.5–5.0)
Alkaline Phosphatase: 56 U/L (ref 38–126)
Anion gap: 14 (ref 5–15)
BUN: 33 mg/dL — ABNORMAL HIGH (ref 8–23)
CO2: 22 mmol/L (ref 22–32)
Calcium: 8.9 mg/dL (ref 8.9–10.3)
Chloride: 104 mmol/L (ref 98–111)
Creatinine, Ser: 1.12 mg/dL (ref 0.61–1.24)
GFR, Estimated: >60 mL/min (ref >60)
Glucose, Bld: 171 mg/dL — ABNORMAL HIGH (ref 70–99)
Potassium: 4 mmol/L (ref 3.5–5.1)
Sodium: 140 mmol/L (ref 135–145)
Total Bilirubin: 1 mg/dL (ref 0.3–1.2)
Total Protein: 6.7 g/dL (ref 6.5–8.1)

## 2020-12-28 LAB — GLUCOSE, CAPILLARY
Glucose-Capillary: 206 mg/dL — ABNORMAL HIGH (ref 70–99)
Glucose-Capillary: 240 mg/dL — ABNORMAL HIGH (ref 70–99)
Glucose-Capillary: 197 mg/dL — ABNORMAL HIGH (ref 70–99)
Glucose-Capillary: 193 mg/dL — ABNORMAL HIGH (ref 70–99)

## 2020-12-28 LAB — TROPONIN I (HIGH SENSITIVITY): Troponin I (High Sensitivity): 753 ng/L (ref <18)

## 2020-12-28 LAB — MAGNESIUM: Magnesium: 2.1 mg/dL (ref 1.7–2.4)

## 2020-12-28 LAB — PROCALCITONIN: Procalcitonin: <0.1 ng/mL

## 2020-12-28 LAB — C-REACTIVE PROTEIN: CRP: 2.2 mg/dL — ABNORMAL HIGH (ref <1.0)

## 2020-12-28 LAB — D-DIMER, QUANTITATIVE: D-Dimer, Quant: 4.67 ug/mL-FEU — ABNORMAL HIGH (ref 0.00–0.50)

## 2020-12-28 LAB — PHOSPHORUS: Phosphorus: 3.9 mg/dL (ref 2.5–4.6)

## 2020-12-28 LAB — FERRITIN: Ferritin: 1217 ng/mL — ABNORMAL HIGH (ref 24–336)

## 2020-12-28 LAB — BRAIN NATRIURETIC PEPTIDE: B Natriuretic Peptide: 347.9 pg/mL — ABNORMAL HIGH (ref 0.0–100.0)

## 2020-12-28 MED ORDER — FUROSEMIDE 10 MG/ML IJ SOLN
40.0000 mg | Freq: Once | INTRAMUSCULAR | Status: AC
  Administered 2020-12-28: 40 mg via INTRAVENOUS
  Filled 2020-12-28: qty 4

## 2020-12-28 MED ORDER — METHYLPREDNISOLONE SODIUM SUCC 125 MG IJ SOLR
60.0000 mg | Freq: Two times a day (BID) | INTRAMUSCULAR | Status: DC
  Administered 2020-12-28 – 2020-12-29 (×3): 60 mg via INTRAVENOUS
  Filled 2020-12-28 (×3): qty 2

## 2020-12-28 MED ORDER — INSULIN GLARGINE 100 UNIT/ML ~~LOC~~ SOLN
10.0000 [IU] | Freq: Every day | SUBCUTANEOUS | Status: DC
  Administered 2020-12-28 – 2020-12-29 (×2): 10 [IU] via SUBCUTANEOUS
  Filled 2020-12-28 (×2): qty 0.1

## 2020-12-28 NOTE — TOC Progression Note (Signed)
Transition of Care [facility]) - Progression Note    Patient Details  Name: Gary Mcneil MRN: [id number] Date of Birth: [birth year]  Transition of Care [facility]) CM/SW Contact  Lawerance Sabal, RN Phone Number: 12/28/2020, 2:26 PM  Clinical Narrative:    Sherron Monday w patient's son Ahmaad. He states that between him and his sister they will be able to provide 24 hour supervision for patient.  Discussed HH agencies and [facility] decided upon and accepted referral. Patient will need 3/1 and possibly home oxygen, ambulatory sats pending.     Expected Discharge Plan: Home w Home Health Services Barriers to Discharge: Continued Medical Work up  Expected Discharge Plan and Services Expected Discharge Plan: Home w Home Health Services In-house Referral: Clinical Social Work Discharge Planning Services: CM Consult Post Acute Care Choice: Home Health,Durable Medical Equipment Living arrangements for the past 2 months: Single Family Home                 DME Arranged: N/A DME Agency: NA       HH Arranged: PT,OT,Nurse's Aide,Social Work [employer] Agency: [facility] Date HH Agency Contacted: 12/28/20 Time HH Agency Contacted: 1425 Representative spoke with at [facility] Agency: Kandee Keen   Social Determinants of Health (SDOH) Interventions    Readmission Risk Interventions Readmission Risk Prevention Plan 12/27/2020  Transportation Screening Complete  PCP or Specialist Appt within 5-7 Days Complete  Home Care Screening Complete  Medication Review (RN CM) Referral to Pharmacy  Some recent data might be hidden

## 2020-12-28 NOTE — Plan of Care (Signed)
  Problem: Clinical Measurements: Goal: Diagnostic test results will improve Outcome: Progressing Goal: Respiratory complications will improve Outcome: Progressing   Problem: Activity: Goal: Risk for activity intolerance will decrease Outcome: Progressing   Problem: Respiratory: Goal: Will maintain a patent airway Outcome: Progressing   Problem: Education: Goal: Knowledge of General Education information will improve Description: Including pain rating scale, medication(s)/side effects and non-pharmacologic comfort measures Outcome: Not Progressing   Problem: Safety: Goal: Ability to remain free from injury will improve Outcome: Not Progressing   Problem: Education: Goal: Knowledge of risk factors and measures for prevention of condition will improve Outcome: Not Progressing

## 2020-12-28 NOTE — TOC Progression Note (Signed)
Transition of Care [facility]) - Progression Note    Patient Details  Name: Gary Mcneil MRN: [id number] Date of Birth: 04/04/1941  Transition of Care [facility]) CM/SW Contact  Mearl Latin, LCSW Phone Number: 12/28/2020, 12:13 PM  Clinical Narrative:    CSW spoke with patient's son, Yuriy, to confirm plan for patient to discharge home when ready. Patient's son reported that patient lives alone at: [address], [place], [place] [address]. He reported that he would like home health services for patient. No preference stated as long as in network with his insurance. CSW discussed equipment needs with patient's son and he requested a 3in1 bedside commode and walker if recommended. CSW confirmed PCP. CSW will follow for home oxygen needs.    Expected Discharge Plan: Home w Home Health Services Barriers to Discharge: Continued Medical Work up  Expected Discharge Plan and Services Expected Discharge Plan: Home w Home Health Services In-house Referral: Clinical Social Work Discharge Planning Services: CM Consult Post Acute Care Choice: Home Health,Durable Medical Equipment Living arrangements for the past 2 months: Single Family Home                 DME Arranged: N/A DME Agency: NA       HH Arranged: NA HH Agency: NA         Social Determinants of Health (SDOH) Interventions    Readmission Risk Interventions Readmission Risk Prevention Plan 12/27/2020  Transportation Screening Complete  PCP or Specialist Appt within 5-7 Days Complete  Home Care Screening Complete  Medication Review (RN CM) Referral to Pharmacy  Some recent data might be hidden

## 2020-12-28 NOTE — Progress Notes (Signed)
  No new recs  Please let us know if further assistance is needed.  Donato Schultz, MD

## 2020-12-28 NOTE — Progress Notes (Signed)
PROGRESS NOTE                                                                                                                                                                                                             Patient Demographics:    Gary Mcneil, is a 80 y.o. male, DOB - Dec 23, 1940, VFI:[id number]  Outpatient Primary MD for the patient is [place], MD   Admit date - 12/24/2020   LOS - 4  Chief Complaint  Patient presents with  . Shortness of Breath       Brief Narrative: Patient is a 80 y.o. male with PMHx of dementia, HTN, DM-2, chronic systolic heart failure-who presented with 1 week history of shortness of breath-found to have acute hypoxic respiratory failure-due to COVID-19 pneumonia and PE and subsequently admitted to the hospitalist service.  COVID-19 vaccinated status: Vaccinated.  Significant Events: 2/5>> Admit to [facility] for hypoxia due to COVID-19 pneumonia 2/5>> worsening hypoxemia after coughing spell-Lasix 40 mg IV x1  Significant studies: 12/27/2020>> chest x-ray: Multifocal pneumonia-similar to prior radiograph 12/24/2020>> CTA chest: Segmental and subsegmental bilateral lower lobe emboli, multifocal pneumonia 12/25/2020>> Echo: EF 30-35%, RV systolic function mildly reduced. 12/25/2020>> lower extremity Doppler: No DVT 02/26/2020>> Echo: EF 25-30%, right RV systolic function mildly reduced-RVSP 31.5  COVID-19 medications: Steroids: 2/4>> Remdesivir: 2/4>> 2/8  Antibiotics: None  Microbiology data: None  Procedures: None  Consults: None  DVT prophylaxis: SCDs Start: 12/24/20 1354 Lovenox at therapeutic dosing    Subjective:   Lying comfortably in bed-titrated down to 2 L this morning.   Assessment  & Plan :   Acute Hypoxic Resp Failure due to Covid 19 Viral pneumonia and pulmonary embolism: Hypoxia had completely resolved-patient was on room air-however on 2/7 evening-he  had a coughing spell-following which he developed hypoxemia-requiring NRB-seems to have responded to IV Lasix-has he was down to 2 L this morning-suspect may have had some amount of pulmonary edema. Will give 1 additional dose of IV Lasix today-change prednisone to IV Solu-Medrol-remains on therapeutic anticoagulation.  Watch for a few more days before consideration of discharge.  Continue to attempt to titrate off oxygen.  Fever: afebrile O2 requirements:  SpO2: (!) 87 % O2 Flow Rate (L/min): 4 L/min FiO2 (%): 40 %   COVID-19 Labs: Recent Labs    12/26/20 0206 12/27/20 0149  12/28/20 0215  DDIMER 18.98* 7.18* 4.67*  FERRITIN 834* 981* 1,217*  CRP 7.1* 3.4* 2.2*       Component Value Date/Time   BNP 347.9 (H) 12/28/2020 0744    Recent Labs  Lab 12/24/20 1802 12/28/20 0744  PROCALCITON <0.10 <0.10    Lab Results  Component Value Date   SARSCOV2NAA POSITIVE (A) 12/24/2020     Prone/Incentive Spirometry: Confused-unlikely will be able to follow commands for incentive spirometry.  Pulmonary embolism: Provoked by COVID-19-remains on therapeutic anticoagulation-lower extremity Dopplers negative for DVT-echo with mildly reduced RV-which probably is chronic.  Non-STEMI-type II: Secondary demand ischemia-appreciate cardiology input-no further work-up recommended-on aspirin/statin/beta-blocker.   Acute on chronic systolic heart failure: Had worsening hypoxemia yesterday-improved-still on 2 L of oxygen- no obvious signs of excess of volume peripherally-repeat IV Lasix today.  Pulmonary hypertension: CT chest shows enlarged pulmonary artery-prior echo with findings of pulmonary hypertension.    EtOH use: Family acknowledges significant alcohol use-no signs of alcohol withdrawal-on Ativan per CIWA protocol.  HTN: BP stable-continue losartan  HLD: Continue statin-watch LFTs  Gout: No flare-continue allopurinol  DM-2: CBG at 10 units of Lantus, continue SSI and Tradjenta.   Follow and adjust.    Recent Labs    12/27/20 2023 12/28/20 0730 12/28/20 1130  GLUCAP 109* 206* 240*   Dementia: Mildly confused-expect delirium during this hospital stay-maintain delirium precautions-continue Aricept/Namenda  GERD: Continue PPI  GI prophylaxis: PPI  ABG:    Component Value Date/Time   PHART 7.408 05/01/2015 0406   PCO2ART 37.3 05/01/2015 0406   PO2ART 77.0 (L) 05/01/2015 0406   HCO3 23.4 12/24/2020 0940   TCO2 25 12/24/2020 0940   ACIDBASEDEF 2.0 12/24/2020 0940   O2SAT 59.0 12/24/2020 0940    Vent Settings:     Condition - Extremely Guarded-  Family Communication  :Spencer Cardinal [phone] updated over the phone on 2/8  Code Status :  Full Code  Diet :  Diet Order            Diet Heart Room service appropriate? Yes; Fluid consistency: Thin  Diet effective now                  Disposition Plan  :   Status is: Inpatient  Remains inpatient appropriate because:Inpatient level of care appropriate due to severity of illness   Dispo: The patient is from: Home              Anticipated d/c is to: Home              Anticipated d/c date is: 1-2 days              Patient currently is not medically stable to d/c.   Difficult to place patient No   Barriers to discharge: Hypoxia requiring O2 supplementation/complete 5 days of IV Remdesivir  Antimicorbials  :    Anti-infectives (From admission, onward)   Start     Dose/Rate Route Frequency Ordered Stop   12/25/20 1000  remdesivir 100 mg in sodium chloride 0.9 % 100 mL IVPB       "Followed by" Linked Group Details   100 mg 200 mL/hr over 30 Minutes Intravenous Daily 12/24/20 1354 12/28/20 0913   12/24/20 1353  remdesivir 200 mg in sodium chloride 0.9% 250 mL IVPB       "Followed by" Linked Group Details   200 mg 580 mL/hr over 30 Minutes Intravenous Once 12/24/20 1354 12/24/20 1821      Inpatient Medications  Scheduled Meds: . albuterol  2 puff Inhalation Q6H  . allopurinol   200 mg Oral Daily  . aspirin  81 mg Oral Daily  . atorvastatin  40 mg Oral QHS  . memantine  28 mg Oral QHS   And  . donepezil  10 mg Oral QHS  . enoxaparin (LOVENOX) injection  1 mg/kg Subcutaneous Q12H  . folic acid  1 mg Oral Daily  . insulin aspart  0-15 Units Subcutaneous TID WC  . linagliptin  5 mg Oral Daily  . losartan  100 mg Oral Daily  . methylPREDNISolone (SOLU-MEDROL) injection  60 mg Intravenous Q12H  . metoprolol succinate  50 mg Oral Daily  . multivitamin with minerals  1 tablet Oral Daily  . pantoprazole  40 mg Oral Q1200  . thiamine  100 mg Oral Daily   Continuous Infusions:  PRN Meds:.guaiFENesin, LORazepam **OR** LORazepam   Time Spent in minutes  35   See all Orders from today for further details   Jeoffrey Massed M.D on 12/28/2020 at 11:49 AM  To page go to [email or web address] - use universal password  [facility] -  Office  [phone]    Objective:   Vitals:   12/27/20 2018 12/28/20 0450 12/28/20 0500 12/28/20 0733  BP: (!) 134/96 133/85  121/87  Pulse:  65  90  Resp: 18 19  (!) 23  Temp: (!) 97.5 F (36.4 C) 97.6 F (36.4 C)  97.8 F (36.6 C)  TempSrc: Oral Oral  Axillary  SpO2:  96%  (!) 87%  Weight:   81.4 kg   Height:        Wt Readings from Last 3 Encounters:  12/28/20 81.4 kg  02/09/20 79.7 kg  11/20/19 80.3 kg     Intake/Output Summary (Last 24 hours) at 12/28/2020 1149 Last data filed at 12/27/2020 2320 Gross per 24 hour  Intake --  Output 1250 ml  Net -1250 ml     Physical Exam Gen Exam:Alert awake-not in any distress HEENT:atraumatic, normocephalic Chest: B/L clear to auscultation anteriorly CVS:S1S2 regular Abdomen:soft non tender, non distended Extremities:no edema Neurology: Non focal Skin: no rash   Data Review:    CBC Recent Labs  Lab 12/24/20 0928 12/24/20 0940 12/24/20 1802 12/25/20 0200 12/26/20 0206 12/27/20 0149 12/28/20 0215  WBC 8.3  --  6.3 4.6 12.3* 13.8* 10.9*  HGB 15.8   < > 14.7  15.1 14.9 14.6 16.5  HCT 44.9   < > 41.7 43.0 40.4 39.7 44.5  PLT 271  --  233 258 319 361 394  MCV 85.2  --  84.4 84.8 82.8 82.7 82.4  MCH 30.0  --  29.8 29.8 30.5 30.4 30.6  MCHC 35.2  --  35.3 35.1 36.9* 36.8* 37.1*  RDW 15.0  --  14.9 15.2 15.1 15.2 15.5  LYMPHSABS 1.4  --   --  0.8 1.1 0.6* 0.9  MONOABS 0.5  --   --  0.1 0.6 0.6 0.6  EOSABS 0.1  --   --  0.0 0.0 0.0 0.6*  BASOSABS 0.1  --   --  0.0 0.1 0.0 0.0   < > = values in this interval not displayed.    Chemistries  Recent Labs  Lab 12/25/20 0200 12/25/20 0712 12/25/20 1233 12/25/20 2305 12/26/20 0206 12/27/20 0149 12/28/20 0215  NA  --  141 138  --  138 138 140  K  --  3.7 3.6 3.6 3.8 4.0 4.0  CL  --  105  103  --  105 105 104  CO2  --  21* 19*  --  18* 21* 22  GLUCOSE  --  213* 261*  --  232* 207* 171*  BUN  --  27* 35*  --  43* 37* 33*  CREATININE  --  1.09 1.25*  --  1.27* 1.26* 1.12  CALCIUM  --  8.7* 8.8*  --  8.9 8.7* 8.9  MG 2.2  --   --  2.2 2.2 2.2 2.1  AST  --  33 47*  --  36 21 21  ALT  --  22 25  --  21 18 20   ALKPHOS  --  60 57  --  52 55 56  BILITOT  --  1.1 0.7  --  0.8 0.8 1.0   ------------------------------------------------------------------------------------------------------------------ No results for input(s): CHOL, HDL, LDLCALC, TRIG, CHOLHDL, LDLDIRECT in the last 72 hours.  Lab Results  Component Value Date   HGBA1C 7.0 (H) 12/24/2020   ------------------------------------------------------------------------------------------------------------------ No results for input(s): TSH, T4TOTAL, T3FREE, THYROIDAB in the last 72 hours.  Invalid input(s): FREET3 ------------------------------------------------------------------------------------------------------------------ Recent Labs    12/27/20 0149 12/28/20 0215  FERRITIN 981* 1,217*    Coagulation profile No results for input(s): INR, PROTIME in the last 168 hours.  Recent Labs    12/27/20 0149 12/28/20 0215  DDIMER 7.18*  4.67*    Cardiac Enzymes No results for input(s): CKMB, TROPONINI, MYOGLOBIN in the last 168 hours.  Invalid input(s): CK ------------------------------------------------------------------------------------------------------------------    Component Value Date/Time   BNP 347.9 (H) 12/28/2020 0744    Micro Results Recent Results (from the past 240 hour(s))  SARS Coronavirus 2 by RT PCR (hospital order, performed in [facility] hospital lab) Nasopharyngeal Nasopharyngeal Swab     Status: Abnormal   Collection Time: 12/24/20  9:20 AM   Specimen: Nasopharyngeal Swab  Result Value Ref Range Status   SARS Coronavirus 2 POSITIVE (A) NEGATIVE Final    Comment: RESULT CALLED TO, READ BACK BY AND VERIFIED WITH: RN S.KLONKET AT 1153 ON 12/24/2020 BY T.SAAD (NOTE) SARS-CoV-2 target nucleic acids are DETECTED  SARS-CoV-2 RNA is generally detectable in upper respiratory specimens  during the acute phase of infection.  Positive results are indicative  of the presence of the identified virus, but do not rule out bacterial infection or co-infection with other pathogens not detected by the test.  Clinical correlation with patient history and  other diagnostic information is necessary to determine patient infection status.  The expected result is negative.  Fact Sheet for Patients:   02/21/2021   Fact Sheet for Healthcare Providers:   BoilerBrush.com.cy    This test is not yet approved or cleared by the https://pope.com/ FDA and  has been authorized for detection and/or diagnosis of SARS-CoV-2 by FDA under an Emergency Use Authorization (EUA).  This EUA will remain in effect (meani ng this test can be used) for the duration of  the COVID-19 declaration under Section 564(b)(1) of the Act, 21 U.S.C. section 360-bbb-3(b)(1), unless the authorization is terminated or revoked sooner.  Performed at [facility], [address]  [place], [place]     Radiology Reports CT Angio Chest PE W and/or Wo Contrast  Addendum Date: 12/24/2020   ADDENDUM REPORT: 12/24/2020 13:30 ADDENDUM: Critical Value/emergent results were called by telephone at the time of interpretation on 12/24/2020 at 1:24 pm to provider [facility] , who verbally acknowledged these results. Electronically Signed   By: [facility].D.  On: 12/24/2020 13:30   Result Date: 12/24/2020 CLINICAL DATA:  COVID positive.  Hypoxia.  Dyspnea.  Tachycardia. EXAM: CT ANGIOGRAPHY CHEST WITH CONTRAST TECHNIQUE: Multidetector CT imaging of the chest was performed using the standard protocol during bolus administration of intravenous contrast. Multiplanar CT image reconstructions and MIPs were obtained to evaluate the vascular anatomy. CONTRAST:  77mL OMNIPAQUE IOHEXOL 350 MG/ML SOLN COMPARISON:  Chest radiograph from earlier today. 04/20/2015 chest CT angiogram. FINDINGS: Cardiovascular: The study is moderate quality for the evaluation of pulmonary embolism, with some motion degradation. Acute segmental and subsegmental anterior left lower lobe pulmonary emboli (series 6/image 161). Tiny subsegmental medial right lower lobe pulmonary embolus (series 6/image 188). No central pulmonary emboli. Atherosclerotic nonaneurysmal thoracic aorta. Markedly dilated main pulmonary artery (4.9 cm diameter), unchanged. Normal heart size. No significant pericardial fluid/thickening. Three-vessel coronary atherosclerosis. Mediastinum/Nodes: No discrete thyroid nodules. Unremarkable esophagus. No pathologically enlarged axillary, mediastinal or hilar lymph nodes. Lungs/Pleura: No pneumothorax. No pleural effusion. Moderate centrilobular and paraseptal emphysema. Extensive patchy ground-glass opacity throughout both lungs involving all lung lobes, new. No lung masses or significant pulmonary nodules. Upper abdomen: Simple 2.5 cm medial upper left renal cyst. Musculoskeletal: No aggressive appearing  focal osseous lesions. Mild thoracic spondylosis. Review of the MIP images confirms the above findings. IMPRESSION: 1. Acute segmental and subsegmental bilateral lower lobe pulmonary emboli. No central pulmonary emboli. 2. Extensive patchy ground-glass opacity throughout both lungs involving all lung lobes, new, compatible with COVID-19 pneumonia. 3. Markedly dilated main pulmonary artery, unchanged, suggesting chronic pulmonary arterial hypertension. 4. Three-vessel coronary atherosclerosis. 5. Aortic Atherosclerosis (ICD10-I70.0) and Emphysema (ICD10-J43.9). Electronically Signed: By: Delbert Phenix M.D. On: 12/24/2020 13:28   DG Chest Port 1 View  Result Date: 12/27/2020 CLINICAL DATA:  80 year old male with shortness of breath EXAM: PORTABLE CHEST 1 VIEW COMPARISON:  Chest radiograph dated 12/24/2020. chest CT dated 12/24/2020 FINDINGS: Bilateral mid to lower lung field patchy and streaky densities as seen on the prior CT and similar or slightly progressed since the prior radiograph and in keeping with multilobar pneumonia and in keeping with COVID-19. No pleural effusion pneumothorax. Stable cardiac silhouette. No acute osseous pathology. IMPRESSION: Multilobar pneumonia similar or slightly progressed since the prior radiograph. Electronically Signed   By: Elgie Collard M.D.   On: 12/27/2020 19:43   DG Chest Portable 1 View  Result Date: 12/24/2020 CLINICAL DATA:  Shortness of breath. EXAM: PORTABLE CHEST 1 VIEW COMPARISON:  May 26, 2015. FINDINGS: The heart size and mediastinal contours are within normal limits. Both lungs are clear. No pneumothorax or pleural effusion is noted. The visualized skeletal structures are unremarkable. IMPRESSION: No active disease. Electronically Signed   By: Lupita Raider M.D.   On: 12/24/2020 09:46   ECHOCARDIOGRAM COMPLETE  Result Date: 12/25/2020    ECHOCARDIOGRAM REPORT   Patient Name:   CLARKSON ROSSELLI Date of Exam: 12/25/2020 Medical Rec #:  [id number]        Height:       72.0 in Accession #:    [id number]      Weight:       177.0 lb Date of Birth:  November 15, 1941        BSA:          2.023 m Patient Age:    79 years        BP:           147/88 mmHg Patient Gender: M  HR:           92 bpm. Exam Location:  Inpatient Procedure: 2D Echo, Cardiac Doppler and Color Doppler Indications:    I50.31 Acute diastolic (congestive) heart failure  History:        Patient has prior history of Echocardiogram examinations, most                 recent 02/26/2020. Covid 19 positive.  Sonographer:    Roosvelt Maser RDCS Referring Phys: [phone] EKTA V PATEL  Sonographer Comments: No parasternal window and Technically difficult study due to poor echo windows. Covid 19 coughing. IMPRESSIONS  1. Left ventricular ejection fraction, by estimation, is 30 to 35%. The left ventricle has moderately decreased function. The left ventricle demonstrates global hypokinesis. There is mild left ventricular hypertrophy. Left ventricular diastolic function  could not be evaluated. There is severe hypokinesis of the left ventricular, mid-apical anterior wall, apical segment and anteroseptal wall.  2. Right ventricular systolic function is mildly reduced. The right ventricular size is normal. There is normal pulmonary artery systolic pressure.  3. The mitral valve was not assessed. No evidence of mitral valve regurgitation.  4. Tricuspid valve regurgitation is mild to moderate.  5. The aortic valve was not assessed. Aortic valve regurgitation is not visualized.  6. The inferior vena cava is dilated in size with >50% respiratory variability, suggesting right atrial pressure of 8 mmHg. Comparison(s): Changes from prior study are noted. 02/26/2020: LVEF 25-30%, global hypokinesis. FINDINGS  Left Ventricle: Left ventricular ejection fraction, by estimation, is 30 to 35%. The left ventricle has moderately decreased function. The left ventricle demonstrates global hypokinesis. Severe hypokinesis of the left  ventricular, mid-apical anterior wall, apical segment and anteroseptal wall. The left ventricular internal cavity size was normal in size. There is mild left ventricular hypertrophy. Left ventricular diastolic function could not be evaluated. Right Ventricle: The right ventricular size is normal. No increase in right ventricular wall thickness. Right ventricular systolic function is mildly reduced. There is normal pulmonary artery systolic pressure. The tricuspid regurgitant velocity is 2.29 m/s, and with an assumed right atrial pressure of 8 mmHg, the estimated right ventricular systolic pressure is 29.0 mmHg. Left Atrium: Left atrial size was normal in size. Right Atrium: Right atrial size was normal in size. Pericardium: There is no evidence of pericardial effusion. Mitral Valve: The mitral valve was not assessed. No evidence of mitral valve regurgitation. Tricuspid Valve: The tricuspid valve is grossly normal. Tricuspid valve regurgitation is mild to moderate. Aortic Valve: The aortic valve was not assessed. Aortic valve regurgitation is not visualized. Pulmonic Valve: The pulmonic valve was not assessed. Pulmonic valve regurgitation is not visualized. Aorta: Aortic root could not be assessed. Venous: The inferior vena cava is dilated in size with greater than 50% respiratory variability, suggesting right atrial pressure of 8 mmHg. IAS/Shunts: No atrial level shunt detected by color flow Doppler.  IVC IVC diam: 2.10 cm LEFT ATRIUM           Index       RIGHT ATRIUM           Index LA Vol (A4C): 43.7 ml 21.60 ml/m RA Area:     12.20 cm                                   RA Volume:   30.90 ml  15.28 ml/m  TRICUSPID VALVE TR Peak grad:  21.0 mmHg TR Vmax:        229.00 cm/s Zoila Shutter MD Electronically signed by Zoila Shutter MD Signature Date/Time: 12/25/2020/4:34:18 PM    Final    VAS [place] LOWER EXTREMITY VENOUS (DVT)  Result Date: 12/26/2020  Lower Venous DVT Study Indications: Covid-19, elevated D-Dimer.   Limitations: Body habitus. Comparison Study: Prior negative left LEV duplex is available from 04/18/12 Performing Technologist: Sherren Kerns RVS  Examination Guidelines: A complete evaluation includes B-mode imaging, spectral Doppler, color Doppler, and power Doppler as needed of all accessible portions of each vessel. Bilateral testing is considered an integral part of a complete examination. Limited examinations for reoccurring indications may be performed as noted. The reflux portion of the exam is performed with the patient in reverse Trendelenburg.  +---------+---------------+---------+-----------+----------+-------------------+ RIGHT    CompressibilityPhasicitySpontaneityPropertiesThrombus Aging      +---------+---------------+---------+-----------+----------+-------------------+ CFV      Full           Yes      Yes                  sluggish flow noted +---------+---------------+---------+-----------+----------+-------------------+ SFJ      Full                                                             +---------+---------------+---------+-----------+----------+-------------------+ FV Prox  Full                                         sluggish flow noted +---------+---------------+---------+-----------+----------+-------------------+ FV Mid   Full                                                             +---------+---------------+---------+-----------+----------+-------------------+ FV DistalFull                                                             +---------+---------------+---------+-----------+----------+-------------------+ PFV      Full                                         sluggish flow noted +---------+---------------+---------+-----------+----------+-------------------+ POP      Full                                                             +---------+---------------+---------+-----------+----------+-------------------+ PTV  patent by color     +---------+---------------+---------+-----------+----------+-------------------+ PERO                                                  patent by color     +---------+---------------+---------+-----------+----------+-------------------+   +---------+---------------+---------+-----------+----------+---------------+ LEFT     CompressibilityPhasicitySpontaneityPropertiesThrombus Aging  +---------+---------------+---------+-----------+----------+---------------+ CFV      Full           Yes      Yes                  sluggish flow   +---------+---------------+---------+-----------+----------+---------------+ SFJ      Full                                                         +---------+---------------+---------+-----------+----------+---------------+ FV Prox  Full                                                         +---------+---------------+---------+-----------+----------+---------------+ FV Mid   Full                                                         +---------+---------------+---------+-----------+----------+---------------+ FV DistalFull                                                         +---------+---------------+---------+-----------+----------+---------------+ PFV      Full                                                         +---------+---------------+---------+-----------+----------+---------------+ POP                     Yes      Yes                  patent by color +---------+---------------+---------+-----------+----------+---------------+ PTV      Full                                                         +---------+---------------+---------+-----------+----------+---------------+ PERO     Full                                                         +---------+---------------+---------+-----------+----------+---------------+  Summary:  BILATERAL: - No evidence of deep vein thrombosis seen in the lower extremities, bilaterally. -   *See table(s) above for measurements and observations. Electronically signed by Sherald Hesshristopher Clark MD on 12/26/2020 at 2:28:21 PM.    Final

## 2020-12-28 NOTE — Progress Notes (Signed)
Physical Therapy Treatment Patient Details Name: Gary Mcneil MRN: [id number] DOB: 01-Apr-1941 Today's Date: 12/28/2020    History of Present Illness Pt is a 80 y.o. male admitted 12/24/20 with SOB. Workup for acute hypoxic respiratory failure due to COVID-19, as well as PE. PMH includes HTN, DM, gout, dementia.   PT Comments    Pt slowly progressing with mobility; remains limited by fatigue and difficult to reason with regarding importance of mobility and strengthening to allow for safe return home. Pt requires up to minA to maintain balance with standing activity. Pt declines ambulation attempts this session. Pt declining SNF-level therapies, adamant about returning home to his dog; reports son able to provide necessary assist. Question reliable historian due to h/o dementia. Will continue to follow acutely to address established goals.  SpO2 88-91% on 2L O2 (when pleth reliable); HR 84   Follow Up Recommendations  Home health PT;Supervision for mobility/OOB (pt declined SNF)     Equipment Recommendations  None recommended by PT    Recommendations for Other Services       Precautions / Restrictions Precautions Precautions: Fall;Other (comment) Precaution Comments: Urine incontinence Restrictions Weight Bearing Restrictions: No    Mobility  Bed Mobility Overal bed mobility: Modified Independent Bed Mobility: Supine to Sit           General bed mobility comments: HOB elevated  Transfers Overall transfer level: Needs assistance Equipment used: 1 person hand held assist Transfers: Sit to/from Stand Sit to Stand: Min assist;Min guard         General transfer comment: Pt reaching for UE support to stand from EOB, requiring minA for HHA to elevate trunk; additional sit<>stands from recliner with min guard for balance, pt using bedside table for UE support to balance once standing upright  Ambulation/Gait Ambulation/Gait assistance: Min guard Gait Distance (Feet): 2  Feet Assistive device: None   Gait velocity: Decreased   General Gait Details: Pt only agreeable to pivotal steps to recliner, min guard for balance; pt reports "I've been walking up and down the hallway with those other people"   Stairs             Wheelchair Mobility    Modified Rankin (Stroke Patients Only)       Balance Overall balance assessment: Needs assistance Sitting-balance support: No upper extremity supported;Feet supported Sitting balance-Leahy Scale: Fair     Standing balance support: No upper extremity supported;Single extremity supported Standing balance-Leahy Scale: Poor Standing balance comment: stability improved with at least single UE support for static standing                            Cognition Arousal/Alertness: Awake/alert Behavior During Therapy: WFL for tasks assessed/performed Overall Cognitive Status: History of cognitive impairments - at baseline Area of Impairment: Orientation;Attention;Memory;Following commands;Safety/judgement;Awareness;Problem solving                 Orientation Level: Disoriented to;Time Current Attention Level: Sustained;Selective Memory: Decreased short-term memory Following Commands: Follows one step commands consistently Safety/Judgement: Decreased awareness of deficits;Decreased awareness of safety Awareness: Emergent Problem Solving: Slow processing;Requires verbal cues;Requires tactile cues General Comments: Per chart, h/o dementia. Pt A&Ox3 (states it's February 2020). Pt following commands appropriately, but difficult to reason with regarding need to mobilize beyond bed to chair. Pt continues to want to save items (food, drink) in pt belongings bag "to take home later" requiring cues that he is likely not d/c today and he can  be provided with additional drinks, etc. Question fully reliable historian      Exercises Other Exercises Other Exercises: Repeated sit<>stand from recliner,  heavy reliance on UE support    General Comments General comments (skin integrity, edema, etc.): SpO2 88-91% on 2L O2 when pleth reliable; HR 85      Pertinent Vitals/Pain Pain Assessment: No/denies pain    Home Living                      Prior Function            PT Goals (current goals can now be found in the care plan section) Acute Rehab PT Goals Patient Stated Goal: Return home to his dog PT Goal Formulation: With patient Time For Goal Achievement: 01/11/21 Potential to Achieve Goals: Good Progress towards PT goals: Progressing toward goals    Frequency    Min 3X/week      PT Plan Frequency needs to be updated;Discharge plan needs to be updated    Co-evaluation              AM-PAC PT "6 Clicks" Mobility   Outcome Measure  Help needed turning from your back to your side while in a flat bed without using bedrails?: None Help needed moving from lying on your back to sitting on the side of a flat bed without using bedrails?: None Help needed moving to and from a bed to a chair (including a wheelchair)?: A Little Help needed standing up from a chair using your arms (e.g., wheelchair or bedside chair)?: A Little Help needed to walk in hospital room?: A Little Help needed climbing 3-5 steps with a railing? : A Lot 6 Click Score: 19    End of Session Equipment Utilized During Treatment: Oxygen Activity Tolerance: Patient tolerated treatment well;Patient limited by fatigue Patient left: in chair;with call bell/phone within reach;with chair alarm set Nurse Communication: Mobility status PT Visit Diagnosis: Unsteadiness on feet (R26.81);Muscle weakness (generalized) (M62.81);Other abnormalities of gait and mobility (R26.89)     Time: 1914-7829 PT Time Calculation (min) (ACUTE ONLY): 17 min  Charges:  $Therapeutic Activity: 8-22 mins                    Ina Homes, PT, DPT Acute Rehabilitation Services  Pager [phone] Office  [phone]  Malachy Chamber 12/28/2020, 10:57 AM

## 2020-12-28 NOTE — Progress Notes (Signed)
Inpatient Diabetes Program Recommendations  AACE/ADA: New Consensus Statement on Inpatient Glycemic Control (2015)  Target Ranges:  Prepandial:   less than 140 mg/dL      Peak postprandial:   less than 180 mg/dL (1-2 hours)      Critically ill patients:  140 - 180 mg/dL   Lab Results  Component Value Date   GLUCAP 206 (H) 12/28/2020   HGBA1C 7.0 (H) 12/24/2020    Review of Glycemic Control Results for Gary Mcneil, Gary Mcneil (MRN [id number]) as of 12/28/2020 11:20  Ref. Range 12/27/2020 12:38 12/27/2020 15:49 12/27/2020 20:23 12/28/2020 07:30  Glucose-Capillary Latest Ref Range: 70 - 99 mg/dL 409 (H) 811 (H) 914 (H) 206 (H)   Diabetes history:  DM2 Outpatient Diabetes medications:  Tradjenta 5 daily Metformin 500 mg daily Current orders for Inpatient glycemic control:  Novolog 0-15 units TID  Tradjenta 5 mg daily Solumedrol 60 mg BID added back today.  Inpatient Diabetes Program Recommendations:    Consider adding Levemir 6 units BID  Will continue to follow while inpatient.  Thanks, Lujean Rave, MSN, RNC-OB Diabetes Coordinator [phone] (8a-5p)

## 2020-12-29 ENCOUNTER — Encounter (HOSPITAL_COMMUNITY): Payer: Self-pay | Admitting: Internal Medicine

## 2020-12-29 DIAGNOSIS — U071 COVID-19: Secondary | ICD-10-CM | POA: Diagnosis not present

## 2020-12-29 DIAGNOSIS — I2699 Other pulmonary embolism without acute cor pulmonale: Secondary | ICD-10-CM | POA: Diagnosis not present

## 2020-12-29 DIAGNOSIS — R748 Abnormal levels of other serum enzymes: Secondary | ICD-10-CM | POA: Diagnosis not present

## 2020-12-29 DIAGNOSIS — F0390 Unspecified dementia without behavioral disturbance: Secondary | ICD-10-CM | POA: Diagnosis not present

## 2020-12-29 LAB — CBC WITH DIFFERENTIAL/PLATELET
Abs Immature Granulocytes: 0 10*3/uL (ref 0.00–0.07)
Basophils Absolute: 0 10*3/uL (ref 0.0–0.1)
Basophils Relative: 0 %
Eosinophils Absolute: 0 10*3/uL (ref 0.0–0.5)
Eosinophils Relative: 0 %
HCT: 46 % (ref 39.0–52.0)
Hemoglobin: 16.9 g/dL (ref 13.0–17.0)
Lymphocytes Relative: 4 %
Lymphs Abs: 0.4 10*3/uL — ABNORMAL LOW (ref 0.7–4.0)
MCH: 30.8 pg (ref 26.0–34.0)
MCHC: 36.7 g/dL — ABNORMAL HIGH (ref 30.0–36.0)
MCV: 83.8 fL (ref 80.0–100.0)
Monocytes Absolute: 0.4 10*3/uL (ref 0.1–1.0)
Monocytes Relative: 4 %
Neutro Abs: 8.6 10*3/uL — ABNORMAL HIGH (ref 1.7–7.7)
Neutrophils Relative %: 92 %
Platelets: 420 10*3/uL — ABNORMAL HIGH (ref 150–400)
RBC: 5.49 MIL/uL (ref 4.22–5.81)
RDW: 15.6 % — ABNORMAL HIGH (ref 11.5–15.5)
WBC: 9.3 10*3/uL (ref 4.0–10.5)
nRBC: 0 % (ref 0.0–0.2)
nRBC: 0 /100 WBC

## 2020-12-29 LAB — COMPREHENSIVE METABOLIC PANEL
ALT: 19 U/L (ref 0–44)
AST: 21 U/L (ref 15–41)
Albumin: 3 g/dL — ABNORMAL LOW (ref 3.5–5.0)
Alkaline Phosphatase: 51 U/L (ref 38–126)
Anion gap: 14 (ref 5–15)
BUN: 46 mg/dL — ABNORMAL HIGH (ref 8–23)
CO2: 19 mmol/L — ABNORMAL LOW (ref 22–32)
Calcium: 8.7 mg/dL — ABNORMAL LOW (ref 8.9–10.3)
Chloride: 106 mmol/L (ref 98–111)
Creatinine, Ser: 1.19 mg/dL (ref 0.61–1.24)
GFR, Estimated: >60 mL/min (ref >60)
Glucose, Bld: 152 mg/dL — ABNORMAL HIGH (ref 70–99)
Potassium: 4.6 mmol/L (ref 3.5–5.1)
Sodium: 139 mmol/L (ref 135–145)
Total Bilirubin: 0.9 mg/dL (ref 0.3–1.2)
Total Protein: 6.2 g/dL — ABNORMAL LOW (ref 6.5–8.1)

## 2020-12-29 LAB — D-DIMER, QUANTITATIVE: D-Dimer, Quant: 2.94 ug/mL-FEU — ABNORMAL HIGH (ref 0.00–0.50)

## 2020-12-29 LAB — GLUCOSE, CAPILLARY
Glucose-Capillary: 200 mg/dL — ABNORMAL HIGH (ref 70–99)
Glucose-Capillary: 295 mg/dL — ABNORMAL HIGH (ref 70–99)
Glucose-Capillary: 187 mg/dL — ABNORMAL HIGH (ref 70–99)
Glucose-Capillary: 184 mg/dL — ABNORMAL HIGH (ref 70–99)

## 2020-12-29 LAB — PHOSPHORUS: Phosphorus: 3.8 mg/dL (ref 2.5–4.6)

## 2020-12-29 LAB — C-REACTIVE PROTEIN: CRP: 1.6 mg/dL — ABNORMAL HIGH (ref <1.0)

## 2020-12-29 LAB — MAGNESIUM: Magnesium: 2.3 mg/dL (ref 1.7–2.4)

## 2020-12-29 LAB — FERRITIN: Ferritin: 967 ng/mL — ABNORMAL HIGH (ref 24–336)

## 2020-12-29 MED ORDER — FUROSEMIDE 10 MG/ML IJ SOLN
40.0000 mg | Freq: Once | INTRAMUSCULAR | Status: AC
  Administered 2020-12-29: 40 mg via INTRAVENOUS
  Filled 2020-12-29: qty 4

## 2020-12-29 MED ORDER — METHYLPREDNISOLONE SODIUM SUCC 40 MG IJ SOLR
40.0000 mg | Freq: Two times a day (BID) | INTRAMUSCULAR | Status: DC
  Administered 2020-12-29 – 2020-12-30 (×2): 40 mg via INTRAVENOUS
  Filled 2020-12-29 (×2): qty 1

## 2020-12-29 MED ORDER — APIXABAN 5 MG PO TABS: 5.0000 mg | ORAL_TABLET | Freq: Two times a day (BID) | ORAL | Status: DC

## 2020-12-29 MED ORDER — APIXABAN 5 MG PO TABS
10.0000 mg | ORAL_TABLET | Freq: Two times a day (BID) | ORAL | Status: DC
  Administered 2020-12-29 – 2020-12-30 (×3): 10 mg via ORAL
  Filled 2020-12-29 (×3): qty 2

## 2020-12-29 MED ORDER — INSULIN GLARGINE 100 UNIT/ML ~~LOC~~ SOLN
14.0000 [IU] | Freq: Every day | SUBCUTANEOUS | Status: DC
  Administered 2020-12-30: 14 [IU] via SUBCUTANEOUS
  Filled 2020-12-29: qty 0.14

## 2020-12-29 MED ORDER — INSULIN ASPART 100 UNIT/ML ~~LOC~~ SOLN
4.0000 [IU] | Freq: Three times a day (TID) | SUBCUTANEOUS | Status: DC
  Administered 2020-12-29 – 2020-12-30 (×3): 4 [IU] via SUBCUTANEOUS

## 2020-12-29 NOTE — Progress Notes (Signed)
Pharmacy - Lovenox to Eliquis  To transition to Eliquis for PE Last dose of Lovenox 2/8  Plan: Eliquis 10 mg po BID x 7 days then 5 mg po BID starting AM 2/9 Follow CBC  Thank you Okey Regal, PharmD

## 2020-12-29 NOTE — Progress Notes (Signed)
PROGRESS NOTE                                                                                                                                                                                                             Patient Demographics:    Gary Mcneil, is a 80 y.o. male, DOB - 10/25/1941, VWU:[id number]  Outpatient Primary MD for the patient is Harvest ForestBakare, Mobolaji B, MD   Admit date - 12/24/2020   LOS - 5  Chief Complaint  Patient presents with  . Shortness of Breath       Brief Narrative: Patient is a 80 y.o. male with PMHx of dementia, HTN, DM-2, chronic systolic heart failure-who presented with 1 week history of shortness of breath-found to have acute hypoxic respiratory failure-due to COVID-19 pneumonia and PE and subsequently admitted to the hospitalist service.  COVID-19 vaccinated status: Vaccinated.  Significant Events: 2/5>> [facility] hypoxia due to COVID-19 pneumonia 2/5>> worsening hypoxemia after coughing spell-Lasix 40 mg IV x1  Significant studies: 12/27/2020>> chest x-ray: Multifocal pneumonia-similar to prior radiograph 12/24/2020>> CTA chest: Segmental and subsegmental bilateral lower lobe emboli, multifocal pneumonia 12/25/2020>> Echo: EF 30-35%, RV systolic function mildly reduced. 12/25/2020>> lower extremity Doppler: No DVT 02/26/2020>> Echo: EF 25-30%, right RV systolic function mildly reduced-RVSP 31.5  COVID-19 medications: Steroids: 2/4>> Remdesivir: 2/4>> 2/8  Antibiotics: None  Microbiology data: None  Procedures: None  Consults: None  DVT prophylaxis: SCDs Start: 12/24/20 1354 Lovenox at therapeutic dosing    Subjective:   Titrated to room air today.  Wants to go home.   Assessment  & Plan :   Acute Hypoxic Resp Failure due to Covid 19 Viral pneumonia and pulmonary embolism: Improved-on room air-but requires around 2-3 L with ambulation.  Although no signs of volume  overload-no clear-cut explanation why he had worsening hypoxemia on 2/5 after several days of improvement.  Repeat IV Lasix today-titrate down Solu-Medrol slightly-change Lovenox to Eliquis.  Reassess on 2/10-if remains clinically improved-suspect can go home.  Probably will require home O2.    Fever: afebrile O2 requirements:  SpO2: 93 % O2 Flow Rate (L/min): 4 L/min FiO2 (%): 40 %   COVID-19 Labs: Recent Labs    12/27/20 0149 12/28/20 0215 12/29/20 0033  DDIMER 7.18* 4.67* 2.94*  FERRITIN 981* 1,217* 967*  CRP 3.4* 2.2* 1.6*  Component Value Date/Time   BNP 347.9 (H) 12/28/2020 0744    Recent Labs  Lab 12/24/20 1802 12/28/20 0744  PROCALCITON <0.10 <0.10    Lab Results  Component Value Date   SARSCOV2NAA POSITIVE (A) 12/24/2020     Prone/Incentive Spirometry: Confused-unlikely will be able to follow commands for incentive spirometry.  Pulmonary embolism: Provoked by COVID-19-remains on therapeutic anticoagulation-lower extremity Dopplers negative for DVT-echo with mildly reduced RV-which probably is chronic.  Non-STEMI-type II: Secondary demand ischemia-appreciate cardiology input-no further work-up recommended-on aspirin/statin/beta-blocker.   Acute on chronic systolic heart failure: Stable-no excess volume on exam-due to mild lingering hypoxemia-repeat 1 additional dose of IV Lasix today.  Pulmonary hypertension: CT chest shows enlarged pulmonary artery-prior echo with findings of pulmonary hypertension.    EtOH use: Family acknowledges significant alcohol use-no signs of alcohol withdrawal-on Ativan per CIWA protocol.  HTN: BP stable-continue losartan  HLD: Continue statin-watch LFTs  Gout: No flare-continue allopurinol  DM-2 with uncontrolled hyperglycemia due to steroids: CBGs still on the higher side-increase Lantus to 14 units, add 4 units of NovoLog with meals.  Suspect has steroids get tapered down-should improve further.    Recent Labs     12/28/20 2021 12/29/20 0802 12/29/20 1128  GLUCAP 193* 200* 295*   Dementia: Mildly confused-expect delirium during this hospital stay-maintain delirium precautions-continue Aricept/Namenda  GERD: Continue PPI  GI prophylaxis: PPI  ABG:    Component Value Date/Time   PHART 7.408 05/01/2015 0406   PCO2ART 37.3 05/01/2015 0406   PO2ART 77.0 (L) 05/01/2015 0406   HCO3 23.4 12/24/2020 0940   TCO2 25 12/24/2020 0940   ACIDBASEDEF 2.0 12/24/2020 0940   O2SAT 59.0 12/24/2020 0940    Vent Settings:     Condition - Extremely Guarded-  Family Communication  :Casen Pryor [phone] updated over the phone on 2/9  Code Status :  Full Code  Diet :  Diet Order            Diet Heart Room service appropriate? Yes; Fluid consistency: Thin  Diet effective now                  Disposition Plan  :   Status is: Inpatient  Remains inpatient appropriate because:Inpatient level of care appropriate due to severity of illness   Dispo: The patient is from: Home              Anticipated d/c is to: Home              Anticipated d/c date is: 1-2 days              Patient currently is not medically stable to d/c.   Difficult to place patient No   Barriers to discharge: Hypoxia requiring O2 supplementation/complete 5 days of IV Remdesivir  Antimicorbials  :    Anti-infectives (From admission, onward)   Start     Dose/Rate Route Frequency Ordered Stop   12/25/20 1000  remdesivir 100 mg in sodium chloride 0.9 % 100 mL IVPB       "Followed by" Linked Group Details   100 mg 200 mL/hr over 30 Minutes Intravenous Daily 12/24/20 1354 12/28/20 1923   12/24/20 1353  remdesivir 200 mg in sodium chloride 0.9% 250 mL IVPB       "Followed by" Linked Group Details   200 mg 580 mL/hr over 30 Minutes Intravenous Once 12/24/20 1354 12/24/20 1821      Inpatient Medications  Scheduled Meds: . albuterol  2 puff Inhalation Q6H  .  allopurinol  200 mg Oral Daily  . apixaban  10 mg  Oral BID   Followed by  . [START ON 01/05/2021] apixaban  5 mg Oral BID  . aspirin  81 mg Oral Daily  . atorvastatin  40 mg Oral QHS  . memantine  28 mg Oral QHS   And  . donepezil  10 mg Oral QHS  . folic acid  1 mg Oral Daily  . insulin aspart  0-15 Units Subcutaneous TID WC  . insulin glargine  10 Units Subcutaneous Daily  . linagliptin  5 mg Oral Daily  . losartan  100 mg Oral Daily  . methylPREDNISolone (SOLU-MEDROL) injection  40 mg Intravenous Q12H  . metoprolol succinate  50 mg Oral Daily  . multivitamin with minerals  1 tablet Oral Daily  . pantoprazole  40 mg Oral Q1200  . thiamine  100 mg Oral Daily   Continuous Infusions:  PRN Meds:.[email or web address]   Time Spent in minutes  25   See all Orders from today for further details   Jeoffrey Massed M.D on 12/29/2020 at 2:04 PM  To page go to [email or web address] - use universal password  [facility] -  Office  [phone]    Objective:   Vitals:   12/28/20 2008 12/29/20 0415 12/29/20 0500 12/29/20 1220  BP: 100/76 120/89  126/88  Pulse: 73 67  77  Resp: 20 17  (!) 21  Temp: 97.9 F (36.6 C) (!) 97.5 F (36.4 C)  97.8 F (36.6 C)  TempSrc: Axillary Oral  Oral  SpO2: 90% 96%  93%  Weight:   81.5 kg   Height:        Wt Readings from Last 3 Encounters:  12/29/20 81.5 kg  02/09/20 79.7 kg  11/20/19 80.3 kg     Intake/Output Summary (Last 24 hours) at 12/29/2020 1404 Last data filed at 12/29/2020 1217 Gross per 24 hour  Intake 360 ml  Output 1250 ml  Net -890 ml     Physical Exam Gen Exam:Alert awake-not in any distress HEENT:atraumatic, normocephalic Chest: B/L clear to auscultation anteriorly CVS:S1S2 regular Abdomen:soft non tender, non distended Extremities:no edema Neurology: Non focal Skin: no rash   Data Review:    CBC Recent Labs  Lab 12/25/20 0200 12/26/20 0206 12/27/20 0149 12/28/20 0215 12/29/20 0033  WBC 4.6 12.3* 13.8* 10.9* 9.3  HGB 15.1 14.9 14.6 16.5 16.9  HCT 43.0 40.4  39.7 44.5 46.0  PLT 258 319 361 394 420*  MCV 84.8 82.8 82.7 82.4 83.8  MCH 29.8 30.5 30.4 30.6 30.8  MCHC 35.1 36.9* 36.8* 37.1* 36.7*  RDW 15.2 15.1 15.2 15.5 15.6*  LYMPHSABS 0.8 1.1 0.6* 0.9 0.4*  MONOABS 0.1 0.6 0.6 0.6 0.4  EOSABS 0.0 0.0 0.0 0.6* 0.0  BASOSABS 0.0 0.1 0.0 0.0 0.0    Chemistries  Recent Labs  Lab 12/25/20 1233 12/25/20 2305 12/26/20 0206 12/27/20 0149 12/28/20 0215 12/29/20 0033  NA 138  --  138 138 140 139  K 3.6 3.6 3.8 4.0 4.0 4.6  CL 103  --  105 105 104 106  CO2 19*  --  18* 21* 22 19*  GLUCOSE 261*  --  232* 207* 171* 152*  BUN 35*  --  43* 37* 33* 46*  CREATININE 1.25*  --  1.27* 1.26* 1.12 1.19  CALCIUM 8.8*  --  8.9 8.7* 8.9 8.7*  MG  --  2.2 2.2 2.2 2.1 2.3  AST 47*  --  36 21 21 21  ALT 25  --  21 18 20 19   ALKPHOS 57  --  52 55 56 51  BILITOT 0.7  --  0.8 0.8 1.0 0.9   ------------------------------------------------------------------------------------------------------------------ No results for input(s): CHOL, HDL, LDLCALC, TRIG, CHOLHDL, LDLDIRECT in the last 72 hours.  Lab Results  Component Value Date   HGBA1C 7.0 (H) 12/24/2020   ------------------------------------------------------------------------------------------------------------------ No results for input(s): TSH, T4TOTAL, T3FREE, THYROIDAB in the last 72 hours.  Invalid input(s): FREET3 ------------------------------------------------------------------------------------------------------------------ Recent Labs    12/28/20 0215 12/29/20 0033  FERRITIN 1,217* 967*    Coagulation profile No results for input(s): INR, PROTIME in the last 168 hours.  Recent Labs    12/28/20 0215 12/29/20 0033  DDIMER 4.67* 2.94*    Cardiac Enzymes No results for input(s): CKMB, TROPONINI, MYOGLOBIN in the last 168 hours.  Invalid input(s): CK ------------------------------------------------------------------------------------------------------------------    Component  Value Date/Time   BNP 347.9 (H) 12/28/2020 0744    Micro Results Recent Results (from the past 240 hour(s))  SARS Coronavirus 2 by RT PCR (hospital order, performed in [facility] hospital lab) Nasopharyngeal Nasopharyngeal Swab     Status: Abnormal   Collection Time: 12/24/20  9:20 AM   Specimen: Nasopharyngeal Swab  Result Value Ref Range Status   SARS Coronavirus 2 POSITIVE (A) NEGATIVE Final    Comment: RESULT CALLED TO, READ BACK BY AND VERIFIED WITH: RN S.KLONKET AT 1153 ON 12/24/2020 BY T.SAAD (NOTE) SARS-CoV-2 target nucleic acids are DETECTED  SARS-CoV-2 RNA is generally detectable in upper respiratory specimens  during the acute phase of infection.  Positive results are indicative  of the presence of the identified virus, but do not rule out bacterial infection or co-infection with other pathogens not detected by the test.  Clinical correlation with patient history and  other diagnostic information is necessary to determine patient infection status.  The expected result is negative.  Fact Sheet for Patients:   02/21/2021   Fact Sheet for Healthcare Providers:   BoilerBrush.com.cy    This test is not yet approved or cleared by the https://pope.com/ FDA and  has been authorized for detection and/or diagnosis of SARS-CoV-2 by FDA under an Emergency Use Authorization (EUA).  This EUA will remain in effect (meani ng this test can be used) for the duration of  the COVID-19 declaration under Section 564(b)(1) of the Act, 21 U.S.C. section 360-bbb-3(b)(1), unless the authorization is terminated or revoked sooner.  Performed at [facility], [address], [place], [place]     Radiology Reports CT Angio Chest PE W and/or Wo Contrast  Addendum Date: 12/24/2020   ADDENDUM REPORT: 12/24/2020 13:30 ADDENDUM: Critical Value/emergent results were called by telephone at the time of interpretation on 12/24/2020 at  1:24 pm to provider [facility] , who verbally acknowledged these results. Electronically Signed   By: [facility] M.D.   On: 12/24/2020 13:30   Result Date: 12/24/2020 CLINICAL DATA:  COVID positive.  Hypoxia.  Dyspnea.  Tachycardia. EXAM: CT ANGIOGRAPHY CHEST WITH CONTRAST TECHNIQUE: Multidetector CT imaging of the chest was performed using the standard protocol during bolus administration of intravenous contrast. Multiplanar CT image reconstructions and MIPs were obtained to evaluate the vascular anatomy. CONTRAST:  94mL OMNIPAQUE IOHEXOL 350 MG/ML SOLN COMPARISON:  Chest radiograph from earlier today. 04/20/2015 chest CT angiogram. FINDINGS: Cardiovascular: The study is moderate quality for the evaluation of pulmonary embolism, with some motion degradation. Acute segmental and subsegmental anterior left lower lobe pulmonary emboli (series 6/image 161).  Tiny subsegmental medial right lower lobe pulmonary embolus (series 6/image 188). No central pulmonary emboli. Atherosclerotic nonaneurysmal thoracic aorta. Markedly dilated main pulmonary artery (4.9 cm diameter), unchanged. Normal heart size. No significant pericardial fluid/thickening. Three-vessel coronary atherosclerosis. Mediastinum/Nodes: No discrete thyroid nodules. Unremarkable esophagus. No pathologically enlarged axillary, mediastinal or hilar lymph nodes. Lungs/Pleura: No pneumothorax. No pleural effusion. Moderate centrilobular and paraseptal emphysema. Extensive patchy ground-glass opacity throughout both lungs involving all lung lobes, new. No lung masses or significant pulmonary nodules. Upper abdomen: Simple 2.5 cm medial upper left renal cyst. Musculoskeletal: No aggressive appearing focal osseous lesions. Mild thoracic spondylosis. Review of the MIP images confirms the above findings. IMPRESSION: 1. Acute segmental and subsegmental bilateral lower lobe pulmonary emboli. No central pulmonary emboli. 2. Extensive patchy ground-glass opacity  throughout both lungs involving all lung lobes, new, compatible with COVID-19 pneumonia. 3. Markedly dilated main pulmonary artery, unchanged, suggesting chronic pulmonary arterial hypertension. 4. Three-vessel coronary atherosclerosis. 5. Aortic Atherosclerosis (ICD10-I70.0) and Emphysema (ICD10-J43.9). Electronically Signed: By: Delbert Phenix M.D. On: 12/24/2020 13:28   DG Chest Port 1 View  Result Date: 12/27/2020 CLINICAL DATA:  80 year old male with shortness of breath EXAM: PORTABLE CHEST 1 VIEW COMPARISON:  Chest radiograph dated 12/24/2020. chest CT dated 12/24/2020 FINDINGS: Bilateral mid to lower lung field patchy and streaky densities as seen on the prior CT and similar or slightly progressed since the prior radiograph and in keeping with multilobar pneumonia and in keeping with COVID-19. No pleural effusion pneumothorax. Stable cardiac silhouette. No acute osseous pathology. IMPRESSION: Multilobar pneumonia similar or slightly progressed since the prior radiograph. Electronically Signed   By: Elgie Collard M.D.   On: 12/27/2020 19:43   DG Chest Portable 1 View  Result Date: 12/24/2020 CLINICAL DATA:  Shortness of breath. EXAM: PORTABLE CHEST 1 VIEW COMPARISON:  May 26, 2015. FINDINGS: The heart size and mediastinal contours are within normal limits. Both lungs are clear. No pneumothorax or pleural effusion is noted. The visualized skeletal structures are unremarkable. IMPRESSION: No active disease. Electronically Signed   By: Lupita Raider M.D.   On: 12/24/2020 09:46   ECHOCARDIOGRAM COMPLETE  Result Date: 12/25/2020    ECHOCARDIOGRAM REPORT   Patient Name:   ADRIAAN MALTESE Date of Exam: 12/25/2020 Medical Rec #:  [id number]       Height:       72.0 in Accession #:    [id number]      Weight:       177.0 lb Date of Birth:  03/29/1941        BSA:          2.023 m Patient Age:    79 years        BP:           147/88 mmHg Patient Gender: M               HR:           92 bpm. Exam Location:   Inpatient Procedure: 2D Echo, Cardiac Doppler and Color Doppler Indications:    I50.31 Acute diastolic (congestive) heart failure  History:        Patient has prior history of Echocardiogram examinations, most                 recent 02/26/2020. Covid 19 positive.  Sonographer:    Roosvelt Maser RDCS Referring Phys: [phone] EKTA V PATEL  Sonographer Comments: No parasternal window and Technically difficult study due to poor echo windows. Covid 19  coughing. IMPRESSIONS  1. Left ventricular ejection fraction, by estimation, is 30 to 35%. The left ventricle has moderately decreased function. The left ventricle demonstrates global hypokinesis. There is mild left ventricular hypertrophy. Left ventricular diastolic function  could not be evaluated. There is severe hypokinesis of the left ventricular, mid-apical anterior wall, apical segment and anteroseptal wall.  2. Right ventricular systolic function is mildly reduced. The right ventricular size is normal. There is normal pulmonary artery systolic pressure.  3. The mitral valve was not assessed. No evidence of mitral valve regurgitation.  4. Tricuspid valve regurgitation is mild to moderate.  5. The aortic valve was not assessed. Aortic valve regurgitation is not visualized.  6. The inferior vena cava is dilated in size with >50% respiratory variability, suggesting right atrial pressure of 8 mmHg. Comparison(s): Changes from prior study are noted. 02/26/2020: LVEF 25-30%, global hypokinesis. FINDINGS  Left Ventricle: Left ventricular ejection fraction, by estimation, is 30 to 35%. The left ventricle has moderately decreased function. The left ventricle demonstrates global hypokinesis. Severe hypokinesis of the left ventricular, mid-apical anterior wall, apical segment and anteroseptal wall. The left ventricular internal cavity size was normal in size. There is mild left ventricular hypertrophy. Left ventricular diastolic function could not be evaluated. Right Ventricle: The  right ventricular size is normal. No increase in right ventricular wall thickness. Right ventricular systolic function is mildly reduced. There is normal pulmonary artery systolic pressure. The tricuspid regurgitant velocity is 2.29 m/s, and with an assumed right atrial pressure of 8 mmHg, the estimated right ventricular systolic pressure is 29.0 mmHg. Left Atrium: Left atrial size was normal in size. Right Atrium: Right atrial size was normal in size. Pericardium: There is no evidence of pericardial effusion. Mitral Valve: The mitral valve was not assessed. No evidence of mitral valve regurgitation. Tricuspid Valve: The tricuspid valve is grossly normal. Tricuspid valve regurgitation is mild to moderate. Aortic Valve: The aortic valve was not assessed. Aortic valve regurgitation is not visualized. Pulmonic Valve: The pulmonic valve was not assessed. Pulmonic valve regurgitation is not visualized. Aorta: Aortic root could not be assessed. Venous: The inferior vena cava is dilated in size with greater than 50% respiratory variability, suggesting right atrial pressure of 8 mmHg. IAS/Shunts: No atrial level shunt detected by color flow Doppler.  IVC IVC diam: 2.10 cm LEFT ATRIUM           Index       RIGHT ATRIUM           Index LA Vol (A4C): 43.7 ml 21.60 ml/m RA Area:     12.20 cm                                   RA Volume:   30.90 ml  15.28 ml/m  TRICUSPID VALVE TR Peak grad:   21.0 mmHg TR Vmax:        229.00 cm/s Zoila Shutter MD Electronically signed by Zoila Shutter MD Signature Date/Time: 12/25/2020/4:34:18 PM    Final    VAS [place] LOWER EXTREMITY VENOUS (DVT)  Result Date: 12/26/2020  Lower Venous DVT Study Indications: Covid-19, elevated D-Dimer.  Limitations: Body habitus. Comparison Study: Prior negative left LEV duplex is available from 04/18/12 Performing Technologist: Sherren Kerns RVS  Examination Guidelines: A complete evaluation includes B-mode imaging, spectral Doppler, color Doppler, and power  Doppler as needed of all accessible portions of each vessel. Bilateral testing is considered an  integral part of a complete examination. Limited examinations for reoccurring indications may be performed as noted. The reflux portion of the exam is performed with the patient in reverse Trendelenburg.  +---------+---------------+---------+-----------+----------+-------------------+ RIGHT    CompressibilityPhasicitySpontaneityPropertiesThrombus Aging      +---------+---------------+---------+-----------+----------+-------------------+ CFV      Full           Yes      Yes                  sluggish flow noted +---------+---------------+---------+-----------+----------+-------------------+ SFJ      Full                                                             +---------+---------------+---------+-----------+----------+-------------------+ FV Prox  Full                                         sluggish flow noted +---------+---------------+---------+-----------+----------+-------------------+ FV Mid   Full                                                             +---------+---------------+---------+-----------+----------+-------------------+ FV DistalFull                                                             +---------+---------------+---------+-----------+----------+-------------------+ PFV      Full                                         sluggish flow noted +---------+---------------+---------+-----------+----------+-------------------+ POP      Full                                                             +---------+---------------+---------+-----------+----------+-------------------+ PTV                                                   patent by color     +---------+---------------+---------+-----------+----------+-------------------+ PERO                                                  patent by color      +---------+---------------+---------+-----------+----------+-------------------+   +---------+---------------+---------+-----------+----------+---------------+ LEFT     CompressibilityPhasicitySpontaneityPropertiesThrombus Aging  +---------+---------------+---------+-----------+----------+---------------+ CFV      Full  Yes      Yes                  sluggish flow   +---------+---------------+---------+-----------+----------+---------------+ SFJ      Full                                                         +---------+---------------+---------+-----------+----------+---------------+ FV Prox  Full                                                         +---------+---------------+---------+-----------+----------+---------------+ FV Mid   Full                                                         +---------+---------------+---------+-----------+----------+---------------+ FV DistalFull                                                         +---------+---------------+---------+-----------+----------+---------------+ PFV      Full                                                         +---------+---------------+---------+-----------+----------+---------------+ POP                     Yes      Yes                  patent by color +---------+---------------+---------+-----------+----------+---------------+ PTV      Full                                                         +---------+---------------+---------+-----------+----------+---------------+ PERO     Full                                                         +---------+---------------+---------+-----------+----------+---------------+     Summary: BILATERAL: - No evidence of deep vein thrombosis seen in the lower extremities, bilaterally. -   *See table(s) above for measurements and observations. Electronically signed by Sherald Hess MD on 12/26/2020 at 2:28:21 PM.    Final

## 2020-12-29 NOTE — TOC Progression Note (Signed)
Transition of Care [facility]) - Progression Note    Patient Details  Name: Gary Mcneil MRN: [id number] Date of Birth: July 04, 1941  Transition of Care [facility]) CM/SW Contact  Lawerance Sabal, RN Phone Number: 12/29/2020, 4:11 PM  Clinical Narrative:    Sherron Monday w son Vuong. Discussed home oxygen needs, and set up. [employer] to deliver Home O2 and 3/1 to the unit for patient to take home. Son will provide transport home. Frances Furbish will provide [facility] services.     Expected Discharge Plan: Home w Home Health Services Barriers to Discharge: Continued Medical Work up  Expected Discharge Plan and Services Expected Discharge Plan: Home w Home Health Services In-house Referral: Clinical Social Work Discharge Planning Services: CM Consult Post Acute Care Choice: Home Health,Durable Medical Equipment Living arrangements for the past 2 months: Single Family Home                 DME Arranged: 3-N-1,Oxygen DME Agency: [employer] Date DME Agency Contacted: 12/29/20 Time DME Agency Contacted: [phone] Representative spoke with at DME Agency: Maud Deed HH Arranged: PT,OT,Nurse's Aide,Social Work [employer] Agency: [facility] Date [facility] Agency Contacted: 12/28/20 Time HH Agency Contacted: 1425 Representative spoke with at [facility] Agency: Kandee Keen   Social Determinants of Health (SDOH) Interventions    Readmission Risk Interventions Readmission Risk Prevention Plan 12/27/2020  Transportation Screening Complete  PCP or Specialist Appt within 5-7 Days Complete  Home Care Screening Complete  Medication Review (RN CM) Referral to Pharmacy  Some recent data might be hidden

## 2020-12-29 NOTE — Progress Notes (Signed)
Occupational Therapy Treatment Patient Details Name: Gary Mcneil MRN: [id number] DOB: [birth year] Today's Date: 12/29/2020    History of present illness Pt is a 80 y.o. male admitted 12/24/20 with SOB. Workup for acute hypoxic respiratory failure due to COVID-19, as well as PE. PMH includes HTN, DM, gout, dementia.   OT comments  Pt continues to be demonstrate extremely limited activity tolerance.  He ambulated around his bed, from the recliner, and had to sit due to fatigue and coughing episode.  He was unable to continue to sink for grooming despite max encouragement. Will continue to follow.   Follow Up Recommendations  Home health OT;Supervision/Assistance - 24 hour    Equipment Recommendations  3 in 1 bedside commode    Recommendations for Other Services Rehab consult    Precautions / Restrictions Precautions Precautions: Fall;Other (comment)       Mobility Bed Mobility Overal bed mobility: Needs Assistance Bed Mobility: Sit to Supine       Sit to supine: Min guard   General bed mobility comments: min guard for safety  Transfers Overall transfer level: Needs assistance Equipment used: Rolling walker (2 wheeled) Transfers: Stand Pivot Transfers;Sit to/from Stand Sit to Stand: Min assist Stand pivot transfers: Min assist       General transfer comment: assist to steady s and assist to maneuver RW    Balance Overall balance assessment: Needs assistance Sitting-balance support: No upper extremity supported;Feet supported Sitting balance-Leahy Scale: Fair     Standing balance support: No upper extremity supported;Single extremity supported Standing balance-Leahy Scale: Poor Standing balance comment: requires UE support                           ADL either performed or assessed with clinical judgement   ADL Overall ADL's : Needs assistance/impaired                     Lower Body Dressing: Moderate assistance;Sit to/from stand Lower Body  Dressing Details (indicate cue type and reason): able to adjust socks independently.  Min A for dynamic standing.  Requires cues and assist to complete tasks due to impaired cognition Toilet Transfer: Minimal assistance;Ambulation;BSC;RW           Functional mobility during ADLs: Minimal assistance;Rolling walker       Vision       Perception     Praxis      Cognition Arousal/Alertness: Awake/alert Behavior During Therapy: WFL for tasks assessed/performed Overall Cognitive Status: No family/caregiver present to determine baseline cognitive functioning                                 General Comments: Pt follows commands with prompting.  He self distracts        Exercises     Shoulder Instructions       General Comments Pt fatigued quite quickly with activity and sat on EOB as he walked around the bed to sink.  He was unable to continue activity due to fatigue.  Sp02 78-96% on RA (poor pleth, so unsure of reliability, as Sp02 rebounded quite rapidly)    Pertinent Vitals/ Pain       Pain Assessment: No/denies pain  Home Living  Prior Functioning/Environment              Frequency  Min 2X/week        Progress Toward Goals  OT Goals(current goals can now be found in the care plan section)  Progress towards OT goals: Progressing toward goals     Plan Discharge plan needs to be updated    Co-evaluation                 AM-PAC OT "6 Clicks" Daily Activity     Outcome Measure   Help from another person eating meals?: A Little Help from another person taking care of personal grooming?: A Little Help from another person toileting, which includes using toliet, bedpan, or urinal?: A Lot Help from another person bathing (including washing, rinsing, drying)?: A Lot Help from another person to put on and taking off regular upper body clothing?: A Lot Help from another person to  put on and taking off regular lower body clothing?: A Lot 6 Click Score: 14    End of Session Equipment Utilized During Treatment: Rolling walker  OT Visit Diagnosis: Unsteadiness on feet (R26.81);Other abnormalities of gait and mobility (R26.89);Muscle weakness (generalized) (M62.81)   Activity Tolerance Patient limited by fatigue   Patient Left in bed;with call bell/phone within reach;with bed alarm set   Nurse Communication Mobility status        Time: 8115-7262 OT Time Calculation (min): 18 min  Charges: OT General Charges $OT Visit: 1 Visit OT Treatments $Therapeutic Activity: 8-22 mins  Eber Jones., OTR/L Acute Rehabilitation Services Pager [phone] Office [phone]    Jeani Hawking M 12/29/2020, 5:16 PM

## 2020-12-29 NOTE — Care Management Important Message (Signed)
Important Message  Patient Details  Name: Gary Mcneil MRN: [id number] Date of Birth: 10-Nov-1941   Medicare Important Message Given:  Yes - Important Message mailed due to current National Emergency   Verbal consent obtained due to current National Emergency  Relationship to patient: Self Contact Name: Deklan Call Date: 12/29/20  Time: 1428 Phone: [phone] Outcome: No Answer/Busy Important Message mailed to: Patient address on file    Orson Aloe 12/29/2020, 2:28 PM

## 2020-12-29 NOTE — Significant Event (Signed)
Discussed w/ covering MD Rathore about placing new PIV on pt during daytime as pt is a bit more alert compared to night where pt sundowns and has hx of dementia. Loney Loh, MD agreeable w/ plan of care.

## 2020-12-29 NOTE — Progress Notes (Signed)
SATURATION QUALIFICATIONS: (This note is used to comply with regulatory documentation for home oxygen)  Patient Saturations on Room Air at Rest = 94%  Patient Saturations on Room Air while Ambulating = 82%  Patient Saturations on 3 Liters of oxygen while Ambulating = 89%  Please briefly explain why patient needs home oxygen:

## 2020-12-30 ENCOUNTER — Other Ambulatory Visit: Payer: Self-pay | Admitting: Internal Medicine

## 2020-12-30 DIAGNOSIS — U071 COVID-19: Secondary | ICD-10-CM | POA: Diagnosis not present

## 2020-12-30 DIAGNOSIS — F0390 Unspecified dementia without behavioral disturbance: Secondary | ICD-10-CM | POA: Diagnosis not present

## 2020-12-30 DIAGNOSIS — J1282 Pneumonia due to coronavirus disease 2019: Secondary | ICD-10-CM

## 2020-12-30 DIAGNOSIS — I2699 Other pulmonary embolism without acute cor pulmonale: Secondary | ICD-10-CM | POA: Diagnosis not present

## 2020-12-30 DIAGNOSIS — E1140 Type 2 diabetes mellitus with diabetic neuropathy, unspecified: Secondary | ICD-10-CM | POA: Diagnosis not present

## 2020-12-30 LAB — COMPREHENSIVE METABOLIC PANEL
ALT: 17 U/L (ref 0–44)
AST: 18 U/L (ref 15–41)
Albumin: 2.8 g/dL — ABNORMAL LOW (ref 3.5–5.0)
Alkaline Phosphatase: 54 U/L (ref 38–126)
Anion gap: 11 (ref 5–15)
BUN: 45 mg/dL — ABNORMAL HIGH (ref 8–23)
CO2: 26 mmol/L (ref 22–32)
Calcium: 9 mg/dL (ref 8.9–10.3)
Chloride: 100 mmol/L (ref 98–111)
Creatinine, Ser: 1.38 mg/dL — ABNORMAL HIGH (ref 0.61–1.24)
GFR, Estimated: 53 mL/min — ABNORMAL LOW (ref >60)
Glucose, Bld: 155 mg/dL — ABNORMAL HIGH (ref 70–99)
Potassium: 3.9 mmol/L (ref 3.5–5.1)
Sodium: 137 mmol/L (ref 135–145)
Total Bilirubin: 0.5 mg/dL (ref 0.3–1.2)
Total Protein: 6 g/dL — ABNORMAL LOW (ref 6.5–8.1)

## 2020-12-30 LAB — GLUCOSE, CAPILLARY
Glucose-Capillary: 163 mg/dL — ABNORMAL HIGH (ref 70–99)
Glucose-Capillary: 128 mg/dL — ABNORMAL HIGH (ref 70–99)

## 2020-12-30 MED ORDER — FOLIC ACID 1 MG PO TABS: 1.0000 mg | ORAL_TABLET | Freq: Every day | ORAL | 0 refills | Status: AC

## 2020-12-30 MED ORDER — METOPROLOL SUCCINATE ER 50 MG PO TB24: 50.0000 mg | ORAL_TABLET | Freq: Every day | ORAL | 0 refills | Status: AC

## 2020-12-30 MED ORDER — THIAMINE HCL 100 MG PO TABS: 100.0000 mg | ORAL_TABLET | Freq: Every day | ORAL | 0 refills | Status: AC

## 2020-12-30 MED ORDER — PANTOPRAZOLE SODIUM 40 MG PO TBEC: 40.0000 mg | DELAYED_RELEASE_TABLET | Freq: Every day | ORAL | 0 refills | Status: DC

## 2020-12-30 MED ORDER — PREDNISONE 10 MG PO TABS: ORAL_TABLET | ORAL | 0 refills | Status: DC

## 2020-12-30 MED ORDER — BENZONATATE 100 MG PO CAPS: 100.0000 mg | ORAL_CAPSULE | Freq: Three times a day (TID) | ORAL | 0 refills | Status: AC | PRN

## 2020-12-30 MED ORDER — APIXABAN (ELIQUIS) VTE STARTER PACK (10MG AND 5MG): ORAL_TABLET | ORAL | 0 refills | Status: DC

## 2020-12-30 MED ORDER — ALBUTEROL SULFATE HFA 108 (90 BASE) MCG/ACT IN AERS: 2.0000 | INHALATION_SPRAY | Freq: Four times a day (QID) | RESPIRATORY_TRACT | 0 refills | Status: AC

## 2020-12-30 MED ORDER — ASPIRIN 81 MG PO CHEW: 81.0000 mg | CHEWABLE_TABLET | Freq: Every day | ORAL | 0 refills | Status: DC

## 2020-12-30 MED ORDER — ATORVASTATIN CALCIUM 40 MG PO TABS: 40.0000 mg | ORAL_TABLET | Freq: Every day | ORAL | 0 refills | Status: AC

## 2020-12-30 MED FILL — FOLIC ACID 1 MG TABS: 1 | 30 days supply | Qty: 30 | Fill #0

## 2020-12-30 MED FILL — PANTOPRAZOLE SOD DR 40 MG T: 40 | 30 days supply | Qty: 30 | Fill #0

## 2020-12-30 MED FILL — METOPROLOL SUCCINATE ER 50: 50 | 30 days supply | Qty: 30 | Fill #0

## 2020-12-30 MED FILL — VITAMIN B-1 100 MG TABS: 100 | 30 days supply | Qty: 30 | Fill #0

## 2020-12-30 MED FILL — predniSONE 10 MG TABS: 10 | 4 days supply | Qty: 10 | Fill #0

## 2020-12-30 MED FILL — ELIQUIS STARTER PACK 5 MG T: 5 | 30 days supply | Qty: 74 | Fill #0

## 2020-12-30 MED FILL — ASPIRIN LOW DOSE 81 MG CHEW: 81 | 30 days supply | Qty: 30 | Fill #0

## 2020-12-30 MED FILL — ATORVASTATIN CALCIUM 40 MG: 40 | 30 days supply | Qty: 30 | Fill #0

## 2020-12-30 MED FILL — BENZONATATE 100 MG CAPS: 100 | 10 days supply | Qty: 30 | Fill #0

## 2020-12-30 MED FILL — ALBUTEROL SULFATE HFA 108 (: 108 (90 BAS | 25 days supply | Qty: 9 | Fill #0

## 2020-12-30 NOTE — Progress Notes (Signed)
Patient was accompanied to patient's lounge (Room 34) until his raid will be here.

## 2020-12-30 NOTE — Discharge Instructions (Addendum)
Person Under Monitoring Name: Gary Mcneil  Location: [address] [place] [address]   Infection Prevention Recommendations for Individuals Confirmed to have, or Being Evaluated for, 2019 Novel Coronavirus (COVID-19) Infection Who Receive Care at Home  Individuals who are confirmed to have, or are being evaluated for, COVID-19 should follow the prevention steps below until a healthcare provider or local or state health department says they can return to normal activities.  Stay home except to get medical care You should restrict activities outside your home, except for getting medical care. Do not go to work, school, or public areas, and do not use public transportation or taxis.  Call ahead before visiting your doctor Before your medical appointment, call the healthcare provider and tell them that you have, or are being evaluated for, COVID-19 infection. This will help the healthcare provider's office take steps to keep other people from getting infected. Ask your healthcare provider to call the local or state health department.  Monitor your symptoms Seek prompt medical attention if your illness is worsening (e.g., difficulty breathing). Before going to your medical appointment, call the healthcare provider and tell them that you have, or are being evaluated for, COVID-19 infection. Ask your healthcare provider to call the local or state health department.  Wear a facemask You should wear a facemask that covers your nose and mouth when you are in the same room with other people and when you visit a healthcare provider. People who live with or visit you should also wear a facemask while they are in the same room with you.  Separate yourself from other people in your home As much as possible, you should stay in a different room from other people in your home. Also, you should use a separate bathroom, if available.  Avoid sharing household items You should not  share dishes, drinking glasses, cups, eating utensils, towels, bedding, or other items with other people in your home. After using these items, you should wash them thoroughly with soap and water.  Cover your coughs and sneezes Cover your mouth and nose with a tissue when you cough or sneeze, or you can cough or sneeze into your sleeve. Throw used tissues in a lined trash can, and immediately wash your hands with soap and water for at least 20 seconds or use an alcohol-based hand rub.  Wash your [employer] your hands often and thoroughly with soap and water for at least 20 seconds. You can use an alcohol-based hand sanitizer if soap and water are not available and if your hands are not visibly dirty. Avoid touching your eyes, nose, and mouth with unwashed hands.   Prevention Steps for Caregivers and Household Members of Individuals Confirmed to have, or Being Evaluated for, COVID-19 Infection Being Cared for in the Home  If you live with, or provide care at home for, a person confirmed to have, or being evaluated for, COVID-19 infection please follow these guidelines to prevent infection:  Follow healthcare provider's instructions Make sure that you understand and can help the patient follow any healthcare provider instructions for all care.  Provide for the patient's basic needs You should help the patient with basic needs in the home and provide support for getting groceries, prescriptions, and other personal needs.  Monitor the patient's symptoms If they are getting sicker, call his or her medical provider and tell them that the patient has, or is being evaluated for, COVID-19 infection. This will help the healthcare provider's office  take steps to keep other people from getting infected. Ask the healthcare provider to call the local or state health department.  Limit the number of people who have contact with the patient  If possible, have only one caregiver for the  patient.  Other household members should stay in another home or place of residence. If this is not possible, they should stay  in another room, or be separated from the patient as much as possible. Use a separate bathroom, if available.  Restrict visitors who do not have an essential need to be in the home.  Keep older adults, very young children, and other sick people away from the patient Keep older adults, very young children, and those who have compromised immune systems or chronic health conditions away from the patient. This includes people with chronic heart, lung, or kidney conditions, diabetes, and cancer.  Ensure good ventilation Make sure that shared spaces in the home have good air flow, such as from an air conditioner or an opened window, weather permitting.  Wash your hands often  Wash your hands often and thoroughly with soap and water for at least 20 seconds. You can use an alcohol based hand sanitizer if soap and water are not available and if your hands are not visibly dirty.  Avoid touching your eyes, nose, and mouth with unwashed hands.  Use disposable paper towels to dry your hands. If not available, use dedicated cloth towels and replace them when they become wet.  Wear a facemask and gloves  Wear a disposable facemask at all times in the room and gloves when you touch or have contact with the patient's blood, body fluids, and/or secretions or excretions, such as sweat, saliva, sputum, nasal mucus, vomit, urine, or feces.  Ensure the mask fits over your nose and mouth tightly, and do not touch it during use.  Throw out disposable facemasks and gloves after using them. Do not reuse.  Wash your hands immediately after removing your facemask and gloves.  If your personal clothing becomes contaminated, carefully remove clothing and launder. Wash your hands after handling contaminated clothing.  Place all used disposable facemasks, gloves, and other waste in a lined  container before disposing them with other household waste.  Remove gloves and wash your hands immediately after handling these items.  Do not share dishes, glasses, or other household items with the patient  Avoid sharing household items. You should not share dishes, drinking glasses, cups, eating utensils, towels, bedding, or other items with a patient who is confirmed to have, or being evaluated for, COVID-19 infection.  After the person uses these items, you should wash them thoroughly with soap and water.  Wash laundry thoroughly  Immediately remove and wash clothes or bedding that have blood, body fluids, and/or secretions or excretions, such as sweat, saliva, sputum, nasal mucus, vomit, urine, or feces, on them.  Wear gloves when handling laundry from the patient.  Read and follow directions on labels of laundry or clothing items and detergent. In general, wash and dry with the warmest temperatures recommended on the label.  Clean all areas the individual has used often  Clean all touchable surfaces, such as counters, tabletops, doorknobs, bathroom fixtures, toilets, phones, keyboards, tablets, and bedside tables, every day. Also, clean any surfaces that may have blood, body fluids, and/or secretions or excretions on them.  Wear gloves when cleaning surfaces the patient has come in contact with.  Use a diluted bleach solution (e.g., dilute bleach with 1 part  bleach and 10 parts water) or a household disinfectant with a label that says EPA-registered for coronaviruses. To make a bleach solution at home, add 1 tablespoon of bleach to 1 quart (4 cups) of water. For a larger supply, add  cup of bleach to 1 gallon (16 cups) of water.  Read labels of cleaning products and follow recommendations provided on product labels. Labels contain instructions for safe and effective use of the cleaning product including precautions you should take when applying the product, such as wearing gloves or  eye protection and making sure you have good ventilation during use of the product.  Remove gloves and wash hands immediately after cleaning.  Monitor yourself for signs and symptoms of illness Caregivers and household members are considered close contacts, should monitor their health, and will be asked to limit movement outside of the home to the extent possible. Follow the monitoring steps for close contacts listed on the symptom monitoring form.   ? If you have additional questions, contact your local health department or call the epidemiologist on call at [phone] (available 24/7).   Information on my medicine - ELIQUIS (apixaban)  This medication education was reviewed with me or my healthcare representative as part of my discharge preparation. Why was Eliquis prescribed for you? Eliquis was prescribed to treat blood clots that may have been found in the veins of your legs (deep vein thrombosis) or in your lungs (pulmonary embolism) and to reduce the risk of them occurring again.  What do You need to know about Eliquis ? The starting dose is 10 mg (two 5 mg tablets) taken TWICE daily for the FIRST SEVEN (7) DAYS, then  the dose is reduced to ONE 5 mg tablet taken TWICE daily.  Eliquis may be taken with or without food.   Try to take the dose about the same time in the morning and in the evening. If you have difficulty swallowing the tablet whole please discuss with your pharmacist how to take the medication safely.  Take Eliquis exactly as prescribed and DO NOT stop taking Eliquis without talking to the doctor who prescribed the medication.  Stopping may increase your risk of developing a new blood clot.  Refill your prescription before you run out.  After discharge, you should have regular check-up appointments with your healthcare provider that is prescribing your Eliquis.    What do you do if you miss a dose? If a dose of ELIQUIS is not taken at the scheduled time,  take it as soon as possible on the same day and twice-daily administration should be resumed. The dose should not be doubled to make up for a missed dose.  Important Safety Information A possible side effect of Eliquis is bleeding. You should call your healthcare provider right away if you experience any of the following: ? Bleeding from an injury or your nose that does not stop. ? Unusual colored urine (red or dark brown) or unusual colored stools (red or black). ? Unusual bruising for unknown reasons. ? A serious fall or if you hit your head (even if there is no bleeding).  Some medicines may interact with Eliquis and might increase your risk of bleeding or clotting while on Eliquis. To help avoid this, consult your healthcare provider or pharmacist prior to using any new prescription or non-prescription medications, including herbals, vitamins, non-steroidal anti-inflammatory drugs (NSAIDs) and supplements.  This website has more information on Eliquis (apixaban): http://www.eliquis.com/eliquis/home  ? This guidance is subject to change. For  the most up-to-date guidance from [facility], please refer to their website: [employer]Met[employer].hu

## 2020-12-30 NOTE — Discharge Summary (Signed)
PATIENT DETAILS Name: Gary Mcneil Age: 80 y.o. Sex: male Date of Birth: [birth year] MRN: [id number]. Admitting Physician: Gertha Calkin, MD WUJ:[id number], Cyndee Brightly, MD  Admit Date: 12/24/2020 Discharge date: 12/30/2020  Recommendations for Outpatient Follow-up:  1. Follow up with PCP in 1-2 weeks 2. Please obtain CMP/CBC in one week 3. Repeat Chest Xray in 4-6 week 4. New medication: Eliquis-we will need uninterrupted therapy for at least 6 months-defer to PCP whether patient needs outpatient follow-up with hematology before discontinuing anticoagulation 5. Being discharged on home O2-mostly with  walking-please reassess at next visit.  Admitted From:  Home  Disposition: Home with home health services   Home Health: Yes  Equipment/Devices: None  Discharge Condition: Stable  CODE STATUS: FULL CODE  Diet recommendation:  Diet Order            Diet - low sodium heart healthy           Diet Carb Modified           Diet Heart Room service appropriate? Yes; Fluid consistency: Thin  Diet effective now                  Brief Narrative: Patient is a 80 y.o. male with PMHx of dementia, HTN, DM-2, chronic systolic heart failure-who presented with 1 week history of shortness of breath-found to have acute hypoxic respiratory failure-due to COVID-19 pneumonia and PE and subsequently admitted to the hospitalist service.  COVID-19 vaccinated status: Vaccinated.  Significant Events: 2/5>> Admit to [facility] for hypoxia due to COVID-19 pneumonia 2/5>> worsening hypoxemia after coughing spell-Lasix 40 mg IV x1  Significant studies: 12/27/2020>> chest x-ray: Multifocal pneumonia-similar to prior radiograph 12/24/2020>> CTA chest: Segmental and subsegmental bilateral lower lobe emboli, multifocal pneumonia 12/25/2020>> Echo: EF 30-35%, RV systolic function mildly reduced. 12/25/2020>> lower extremity Doppler: No DVT 02/26/2020>> Echo: EF 25-30%, right RV systolic function mildly  reduced-RVSP 31.5  COVID-19 medications: Steroids: 2/4>> Remdesivir: 2/4>> 2/8  Antibiotics: None  Microbiology data: None  Procedures: None  Consults: Cardiology  Brief Hospital Course: Acute Hypoxic Resp Failure due to Covid 19 Viral pneumonia and pulmonary embolism:  Significantly better-treated with steroids/Remdesivir/Lasix/full dose anticoagulation.  Had improved-but suspect developed mild pulmonary edema causing worsening of hypoxemia-given IV Lasix with significant improvement over the past several days.  He is on room air-but does require O2 with ambulation.  Plans are to discharge on tapering steroids-volume status is stable.  He will also continue with anticoagulation-Eliquis on discharge.  Suspect needs 6 months of uninterrupted anticoagulation-defer to PCP whether patient needs hematology evaluation prior to discontinuing anticoagulation.  Suspect PE to be provoked due to COVID-19.    COVID-19 Labs:  Recent Labs    12/28/20 0215 12/29/20 0033  DDIMER 4.67* 2.94*  FERRITIN 1,217* 967*  CRP 2.2* 1.6*    Lab Results  Component Value Date   SARSCOV2NAA POSITIVE (A) 12/24/2020     Pulmonary embolism: Provoked by COVID-19-remains on therapeutic anticoagulation-lower extremity Dopplers negative for DVT-echo with mildly reduced RV-which probably is chronic.  Non-STEMI-type II: Secondary demand ischemia-appreciate cardiology input-no further work-up recommended-on aspirin/statin/beta-blocker.  PCP to refer to cardiology MD outpatient setting  Acute on chronic systolic heart failure:  Significantly improved-compensated-suspect does not require Lasix on discharge.  PCP to reassess volume status at next visit.  Pulmonary hypertension: CT chest shows enlarged pulmonary artery-prior echo with findings of pulmonary hypertension.    EtOH use: Family acknowledges significant alcohol use-no signs of alcohol withdrawal-on Ativan per CIWA protocol.  Counseled  extensively regarding importance of stopping any further alcohol use-patient has history of dementia-now has a PE and has been started on anticoagulation.  Family aware of the risk of catastrophic falls if intoxicated.  HTN: BP stable-continue losartan/Lopressor  HLD: Continue statin  Gout: No flare-continue allopurinol  DM-2 with uncontrolled hyperglycemia due to steroids: CBGs improved-since steroids being significantly tapered down-will be placed back on usual home regimen.  PCP to optimize further.    Dementia: Mildly confused-expect delirium during this hospital stay-maintain delirium precautions-continue Aricept/Namenda  GERD: Continue PPI  Discharge Diagnoses:  Principal Problem:   Acute hypoxemic respiratory failure due to COVID-19 [facility]) Active Problems:   HTN (hypertension)   Anemia   Elevated liver enzymes   Gout   Type 2 diabetes mellitus with diabetic neuropathy (HCC)   Dementia without behavioral disturbance (HCC)   Hypokalemia   Tobacco use disorder   GERD (gastroesophageal reflux disease)   Pulmonary embolism [facility])   Discharge Instructions:    Person Under Monitoring Name: Gary Mcneil  Location: [address] [place] [address]   Infection Prevention Recommendations for Individuals Confirmed to have, or Being Evaluated for, 2019 Novel Coronavirus (COVID-19) Infection Who Receive Care at Home  Individuals who are confirmed to have, or are being evaluated for, COVID-19 should follow the prevention steps below until a healthcare provider or local or state health department says they can return to normal activities.  Stay home except to get medical care You should restrict activities outside your home, except for getting medical care. Do not go to work, school, or public areas, and do not use public transportation or taxis.  Call ahead before visiting your doctor Before your medical appointment, call the healthcare provider and tell them  that you have, or are being evaluated for, COVID-19 infection. This will help the healthcare provider's office take steps to keep other people from getting infected. Ask your healthcare provider to call the local or state health department.  Monitor your symptoms Seek prompt medical attention if your illness is worsening (e.g., difficulty breathing). Before going to your medical appointment, call the healthcare provider and tell them that you have, or are being evaluated for, COVID-19 infection. Ask your healthcare provider to call the local or state health department.  Wear a facemask You should wear a facemask that covers your nose and mouth when you are in the same room with other people and when you visit a healthcare provider. People who live with or visit you should also wear a facemask while they are in the same room with you.  Separate yourself from other people in your home As much as possible, you should stay in a different room from other people in your home. Also, you should use a separate bathroom, if available.  Avoid sharing household items You should not share dishes, drinking glasses, cups, eating utensils, towels, bedding, or other items with other people in your home. After using these items, you should wash them thoroughly with soap and water.  Cover your coughs and sneezes Cover your mouth and nose with a tissue when you cough or sneeze, or you can cough or sneeze into your sleeve. Throw used tissues in a lined trash can, and immediately wash your hands with soap and water for at least 20 seconds or use an alcohol-based hand rub.  Wash your [employer] your hands often and thoroughly with soap and water for at least 20 seconds. You can use an alcohol-based hand sanitizer if  soap and water are not available and if your hands are not visibly dirty. Avoid touching your eyes, nose, and mouth with unwashed hands.   Prevention Steps for Caregivers and Household Members  of Individuals Confirmed to have, or Being Evaluated for, COVID-19 Infection Being Cared for in the Home  If you live with, or provide care at home for, a person confirmed to have, or being evaluated for, COVID-19 infection please follow these guidelines to prevent infection:  Follow healthcare provider's instructions Make sure that you understand and can help the patient follow any healthcare provider instructions for all care.  Provide for the patient's basic needs You should help the patient with basic needs in the home and provide support for getting groceries, prescriptions, and other personal needs.  Monitor the patient's symptoms If they are getting sicker, call his or her medical provider and tell them that the patient has, or is being evaluated for, COVID-19 infection. This will help the healthcare provider's office take steps to keep other people from getting infected. Ask the healthcare provider to call the local or state health department.  Limit the number of people who have contact with the patient  If possible, have only one caregiver for the patient.  Other household members should stay in another home or place of residence. If this is not possible, they should stay  in another room, or be separated from the patient as much as possible. Use a separate bathroom, if available.  Restrict visitors who do not have an essential need to be in the home.  Keep older adults, very young children, and other sick people away from the patient Keep older adults, very young children, and those who have compromised immune systems or chronic health conditions away from the patient. This includes people with chronic heart, lung, or kidney conditions, diabetes, and cancer.  Ensure good ventilation Make sure that shared spaces in the home have good air flow, such as from an air conditioner or an opened window, weather permitting.  Wash your hands often  Wash your hands often and  thoroughly with soap and water for at least 20 seconds. You can use an alcohol based hand sanitizer if soap and water are not available and if your hands are not visibly dirty.  Avoid touching your eyes, nose, and mouth with unwashed hands.  Use disposable paper towels to dry your hands. If not available, use dedicated cloth towels and replace them when they become wet.  Wear a facemask and gloves  Wear a disposable facemask at all times in the room and gloves when you touch or have contact with the patient's blood, body fluids, and/or secretions or excretions, such as sweat, saliva, sputum, nasal mucus, vomit, urine, or feces.  Ensure the mask fits over your nose and mouth tightly, and do not touch it during use.  Throw out disposable facemasks and gloves after using them. Do not reuse.  Wash your hands immediately after removing your facemask and gloves.  If your personal clothing becomes contaminated, carefully remove clothing and launder. Wash your hands after handling contaminated clothing.  Place all used disposable facemasks, gloves, and other waste in a lined container before disposing them with other household waste.  Remove gloves and wash your hands immediately after handling these items.  Do not share dishes, glasses, or other household items with the patient  Avoid sharing household items. You should not share dishes, drinking glasses, cups, eating utensils, towels, bedding, or other items with a patient  who is confirmed to have, or being evaluated for, COVID-19 infection.  After the person uses these items, you should wash them thoroughly with soap and water.  Wash laundry thoroughly  Immediately remove and wash clothes or bedding that have blood, body fluids, and/or secretions or excretions, such as sweat, saliva, sputum, nasal mucus, vomit, urine, or feces, on them.  Wear gloves when handling laundry from the patient.  Read and follow directions on labels of laundry or  clothing items and detergent. In general, wash and dry with the warmest temperatures recommended on the label.  Clean all areas the individual has used often  Clean all touchable surfaces, such as counters, tabletops, doorknobs, bathroom fixtures, toilets, phones, keyboards, tablets, and bedside tables, every day. Also, clean any surfaces that may have blood, body fluids, and/or secretions or excretions on them.  Wear gloves when cleaning surfaces the patient has come in contact with.  Use a diluted bleach solution (e.g., dilute bleach with 1 part bleach and 10 parts water) or a household disinfectant with a label that says EPA-registered for coronaviruses. To make a bleach solution at home, add 1 tablespoon of bleach to 1 quart (4 cups) of water. For a larger supply, add  cup of bleach to 1 gallon (16 cups) of water.  Read labels of cleaning products and follow recommendations provided on product labels. Labels contain instructions for safe and effective use of the cleaning product including precautions you should take when applying the product, such as wearing gloves or eye protection and making sure you have good ventilation during use of the product.  Remove gloves and wash hands immediately after cleaning.  Monitor yourself for signs and symptoms of illness Caregivers and household members are considered close contacts, should monitor their health, and will be asked to limit movement outside of the home to the extent possible. Follow the monitoring steps for close contacts listed on the symptom monitoring form.   ? If you have additional questions, contact your local health department or call the epidemiologist on call at [phone] (available 24/7). ? This guidance is subject to change. For the most up-to-date guidance from CDC, please refer to their website: [employer]Met[employer].hu    Activity:  As tolerated with Full fall  precautions use walker/cane & assistance as needed   Discharge Instructions    Call MD for:  difficulty breathing, headache or visual disturbances   Complete by: As directed    Diet - low sodium heart healthy   Complete by: As directed    Diet Carb Modified   Complete by: As directed    Discharge instructions   Complete by: As directed    1.)  21 days of isolation from 2/4  2.)  If you develop worsening shortness of breath-please seek immediate medical attention  3.)  You have been started on a blood thinner called Eliquis-you will be provided a starter pack on discharge-you will need refills-please contact your primary care practitioner for further refills.  4.)  If you develop black stools, bloody stools or vomit blood or suffer from a head injury-please seek immediate medical attention.  5.)  Avoid alcohol use-you are on a blood thinner-you will be at increasedrisk for catastrophic falls if intoxicated.  6.)  Home O2 ordered-mostly for ambulation-please ask your primary care to reassess whether you still require home O2 at next visit   Follow with Primary MD  Jamison Oka B, MD in 1-2 weeks  Please get a complete blood count and chemistry  panel checked by your Primary MD at your next visit, and again as instructed by your Primary MD.  Get Medicines reviewed and adjusted: Please take all your medications with you for your next visit with your Primary MD  Laboratory/radiological data: Please request your Primary MD to go over all hospital tests and procedure/radiological results at the follow up, please ask your Primary MD to get all Hospital records sent to his/her office.  In some cases, they will be blood work, cultures and biopsy results pending at the time of your discharge. Please request that your primary care M.D. follows up on these results.  Also Note the following: If you experience worsening of your admission symptoms, develop shortness of breath, life threatening  emergency, suicidal or homicidal thoughts you must seek medical attention immediately by calling 911 or calling your MD immediately  if symptoms less severe.  You must read complete instructions/literature along with all the possible adverse reactions/side effects for all the Medicines you take and that have been prescribed to you. Take any new Medicines after you have completely understood and accpet all the possible adverse reactions/side effects.   Do not drive when taking Pain medications or sleeping medications (Benzodaizepines)  Do not take more than prescribed Pain, Sleep and Anxiety Medications. It is not advisable to combine anxiety,sleep and pain medications without talking with your primary care practitioner  Special Instructions: If you have smoked or chewed Tobacco  in the last 2 yrs please stop smoking, stop any regular Alcohol  and or any Recreational drug use.  Wear Seat belts while driving.  Please note: You were cared for by a hospitalist during your hospital stay. Once you are discharged, your primary care physician will handle any further medical issues. Please note that NO REFILLS for any discharge medications will be authorized once you are discharged, as it is imperative that you return to your primary care physician (or establish a relationship with a primary care physician if you do not have one) for your post hospital discharge needs so that they can reassess your need for medications and monitor your lab values.   Increase activity slowly   Complete by: As directed      Allergies as of 12/30/2020      Reactions   Penicillins Rash      Medication List    TAKE these medications   albuterol 108 (90 Base) MCG/ACT inhaler Commonly known as: VENTOLIN HFA Inhale 2 puffs into the lungs every 6 (six) hours.   allopurinol 300 MG tablet Commonly known as: ZYLOPRIM Take 300 mg by mouth daily.   Apixaban Starter Pack (10mg  and 5mg ) Commonly known as: ELIQUIS STARTER  PACK Take as directed on package: start with two-5mg  tablets twice daily for 7 days. On day 8, switch to one-5mg  tablet twice daily.   aspirin 81 MG chewable tablet Chew 1 tablet (81 mg total) by mouth daily. Start taking on: December 31, 2020   atorvastatin 40 MG tablet Commonly known as: LIPITOR Take 1 tablet (40 mg total) by mouth at bedtime. What changed:   medication strength  how much to take   benzonatate 100 MG capsule Commonly known as: Tessalon Perles Take 1 capsule (100 mg total) by mouth 3 (three) times daily as needed for cough.   folic acid 1 MG tablet Commonly known as: FOLVITE Take 1 tablet (1 mg total) by mouth daily. Start taking on: December 31, 2020   linagliptin 5 MG Tabs tablet Commonly known as: TRADJENTA  Take 5 mg by mouth daily.   losartan 100 MG tablet Commonly known as: COZAAR TAKE 1 TABLET(100 MG) BY MOUTH DAILY   metFORMIN 500 MG tablet Commonly known as: GLUCOPHAGE TAKE 1 TABLET BY MOUTH EVERY DAY WITH BREAKFAST What changed:   how much to take  how to take this  when to take this   metoprolol succinate 50 MG 24 hr tablet Commonly known as: TOPROL-XL Take 1 tablet (50 mg total) by mouth daily. Take with or immediately following a meal. Start taking on: December 31, 2020   multivitamin with minerals Tabs tablet Take 1 tablet by mouth daily.   Namzaric 28-10 MG Cp24 Generic drug: Memantine HCl-Donepezil HCl Take 1 capsule by mouth daily.   pantoprazole 40 MG tablet Commonly known as: PROTONIX Take 1 tablet (40 mg total) by mouth daily at 12 noon.   potassium chloride SA 20 MEQ tablet Commonly known as: KLOR-CON Take 3 tablets (60 mEq total) by mouth daily.   predniSONE 10 MG tablet Commonly known as: DELTASONE Take 40 mg daily for 1 day, 30 mg daily for 1 day, 20 mg daily for 1 days,10 mg daily for 1 day, then stop   thiamine 100 MG tablet Take 1 tablet (100 mg total) by mouth daily. Start taking on: December 31, 2020             Durable Medical Equipment  (From admission, onward)         Start     Ordered   12/29/20 1610  For home use only DME 3 n 1  Once        12/29/20 1609   12/29/20 1404  For home use only DME oxygen  Once       Question Answer Comment  Length of Need 6 Months   Mode or (Route) Nasal cannula   Liters per Minute 3   Frequency Continuous (stationary and portable oxygen unit needed)   Oxygen delivery system Gas      12/29/20 1403          Follow-up Information    [facility] Follow up.   Specialty: Home Health Services Why: For home health services. They will call you in 1-2 days to set up your first home appointment Contact information: [address] [place] [address] [phone]        [facility] Follow up.   Why: for home oxygen Contact information: [address] [address] [phone]        [place], MD. Schedule an appointment as soon as possible for a visit in 1 week(s).   Specialty: Internal Medicine Contact information: [address] Raeanne Gathers [place] [address] [phone]              Allergies  Allergen Reactions  . Penicillins Rash    Other Procedures/Studies: CT Angio Chest PE W and/or Wo Contrast  Addendum Date: 12/24/2020   ADDENDUM REPORT: 12/24/2020 13:30 ADDENDUM: Critical Value/emergent results were called by telephone at the time of interpretation on 12/24/2020 at 1:24 pm to provider [facility] , who verbally acknowledged these results. Electronically Signed   By: Delbert Phenix M.D.   On: 12/24/2020 13:30   Result Date: 12/24/2020 CLINICAL DATA:  COVID positive.  Hypoxia.  Dyspnea.  Tachycardia. EXAM: CT ANGIOGRAPHY CHEST WITH CONTRAST TECHNIQUE: Multidetector CT imaging of the chest was performed using the standard protocol during bolus administration of intravenous contrast.  Multiplanar CT image reconstructions and MIPs were obtained  to evaluate the vascular anatomy. CONTRAST:  77mL OMNIPAQUE IOHEXOL 350 MG/ML SOLN COMPARISON:  Chest radiograph from earlier today. 04/20/2015 chest CT angiogram. FINDINGS: Cardiovascular: The study is moderate quality for the evaluation of pulmonary embolism, with some motion degradation. Acute segmental and subsegmental anterior left lower lobe pulmonary emboli (series 6/image 161). Tiny subsegmental medial right lower lobe pulmonary embolus (series 6/image 188). No central pulmonary emboli. Atherosclerotic nonaneurysmal thoracic aorta. Markedly dilated main pulmonary artery (4.9 cm diameter), unchanged. Normal heart size. No significant pericardial fluid/thickening. Three-vessel coronary atherosclerosis. Mediastinum/Nodes: No discrete thyroid nodules. Unremarkable esophagus. No pathologically enlarged axillary, mediastinal or hilar lymph nodes. Lungs/Pleura: No pneumothorax. No pleural effusion. Moderate centrilobular and paraseptal emphysema. Extensive patchy ground-glass opacity throughout both lungs involving all lung lobes, new. No lung masses or significant pulmonary nodules. Upper abdomen: Simple 2.5 cm medial upper left renal cyst. Musculoskeletal: No aggressive appearing focal osseous lesions. Mild thoracic spondylosis. Review of the MIP images confirms the above findings. IMPRESSION: 1. Acute segmental and subsegmental bilateral lower lobe pulmonary emboli. No central pulmonary emboli. 2. Extensive patchy ground-glass opacity throughout both lungs involving all lung lobes, new, compatible with COVID-19 pneumonia. 3. Markedly dilated main pulmonary artery, unchanged, suggesting chronic pulmonary arterial hypertension. 4. Three-vessel coronary atherosclerosis. 5. Aortic Atherosclerosis (ICD10-I70.0) and Emphysema (ICD10-J43.9). Electronically Signed: By: Delbert Phenix M.D. On: 12/24/2020 13:28   DG Chest Port 1 View  Result Date: 12/27/2020 CLINICAL DATA:  80 year old male with shortness of breath EXAM:  PORTABLE CHEST 1 VIEW COMPARISON:  Chest radiograph dated 12/24/2020. chest CT dated 12/24/2020 FINDINGS: Bilateral mid to lower lung field patchy and streaky densities as seen on the prior CT and similar or slightly progressed since the prior radiograph and in keeping with multilobar pneumonia and in keeping with COVID-19. No pleural effusion pneumothorax. Stable cardiac silhouette. No acute osseous pathology. IMPRESSION: Multilobar pneumonia similar or slightly progressed since the prior radiograph. Electronically Signed   By: Elgie Collard M.D.   On: 12/27/2020 19:43   DG Chest Portable 1 View  Result Date: 12/24/2020 CLINICAL DATA:  Shortness of breath. EXAM: PORTABLE CHEST 1 VIEW COMPARISON:  May 26, 2015. FINDINGS: The heart size and mediastinal contours are within normal limits. Both lungs are clear. No pneumothorax or pleural effusion is noted. The visualized skeletal structures are unremarkable. IMPRESSION: No active disease. Electronically Signed   By: Lupita Raider M.D.   On: 12/24/2020 09:46   ECHOCARDIOGRAM COMPLETE  Result Date: 12/25/2020    ECHOCARDIOGRAM REPORT   Patient Name:   Gary Mcneil Date of Exam: 12/25/2020 Medical Rec #:  [id number]       Height:       72.0 in Accession #:    [id number]      Weight:       177.0 lb Date of Birth:  05-30-1941        BSA:          2.023 m Patient Age:    79 years        BP:           147/88 mmHg Patient Gender: M               HR:           92 bpm. Exam Location:  Inpatient Procedure: 2D Echo, Cardiac Doppler and Color Doppler Indications:    I50.31 Acute diastolic (congestive) heart failure  History:  Patient has prior history of Echocardiogram examinations, most                 recent 02/26/2020. Covid 19 positive.  Sonographer:    Roosvelt Maser RDCS Referring Phys: [phone] EKTA V PATEL  Sonographer Comments: No parasternal window and Technically difficult study due to poor echo windows. Covid 19 coughing. IMPRESSIONS  1. Left ventricular  ejection fraction, by estimation, is 30 to 35%. The left ventricle has moderately decreased function. The left ventricle demonstrates global hypokinesis. There is mild left ventricular hypertrophy. Left ventricular diastolic function  could not be evaluated. There is severe hypokinesis of the left ventricular, mid-apical anterior wall, apical segment and anteroseptal wall.  2. Right ventricular systolic function is mildly reduced. The right ventricular size is normal. There is normal pulmonary artery systolic pressure.  3. The mitral valve was not assessed. No evidence of mitral valve regurgitation.  4. Tricuspid valve regurgitation is mild to moderate.  5. The aortic valve was not assessed. Aortic valve regurgitation is not visualized.  6. The inferior vena cava is dilated in size with >50% respiratory variability, suggesting right atrial pressure of 8 mmHg. Comparison(s): Changes from prior study are noted. 02/26/2020: LVEF 25-30%, global hypokinesis. FINDINGS  Left Ventricle: Left ventricular ejection fraction, by estimation, is 30 to 35%. The left ventricle has moderately decreased function. The left ventricle demonstrates global hypokinesis. Severe hypokinesis of the left ventricular, mid-apical anterior wall, apical segment and anteroseptal wall. The left ventricular internal cavity size was normal in size. There is mild left ventricular hypertrophy. Left ventricular diastolic function could not be evaluated. Right Ventricle: The right ventricular size is normal. No increase in right ventricular wall thickness. Right ventricular systolic function is mildly reduced. There is normal pulmonary artery systolic pressure. The tricuspid regurgitant velocity is 2.29 m/s, and with an assumed right atrial pressure of 8 mmHg, the estimated right ventricular systolic pressure is 29.0 mmHg. Left Atrium: Left atrial size was normal in size. Right Atrium: Right atrial size was normal in size. Pericardium: There is no evidence  of pericardial effusion. Mitral Valve: The mitral valve was not assessed. No evidence of mitral valve regurgitation. Tricuspid Valve: The tricuspid valve is grossly normal. Tricuspid valve regurgitation is mild to moderate. Aortic Valve: The aortic valve was not assessed. Aortic valve regurgitation is not visualized. Pulmonic Valve: The pulmonic valve was not assessed. Pulmonic valve regurgitation is not visualized. Aorta: Aortic root could not be assessed. Venous: The inferior vena cava is dilated in size with greater than 50% respiratory variability, suggesting right atrial pressure of 8 mmHg. IAS/Shunts: No atrial level shunt detected by color flow Doppler.  IVC IVC diam: 2.10 cm LEFT ATRIUM           Index       RIGHT ATRIUM           Index LA Vol (A4C): 43.7 ml 21.60 ml/m RA Area:     12.20 cm                                   RA Volume:   30.90 ml  15.28 ml/m  TRICUSPID VALVE TR Peak grad:   21.0 mmHg TR Vmax:        229.00 cm/s Zoila Shutter MD Electronically signed by Zoila Shutter MD Signature Date/Time: 12/25/2020/4:34:18 PM    Final    VAS [place] LOWER EXTREMITY VENOUS (DVT)  Result Date:  12/26/2020  Lower Venous DVT Study Indications: Covid-19, elevated D-Dimer.  Limitations: Body habitus. Comparison Study: Prior negative left LEV duplex is available from 04/18/12 Performing Technologist: Sherren Kerns RVS  Examination Guidelines: A complete evaluation includes B-mode imaging, spectral Doppler, color Doppler, and power Doppler as needed of all accessible portions of each vessel. Bilateral testing is considered an integral part of a complete examination. Limited examinations for reoccurring indications may be performed as noted. The reflux portion of the exam is performed with the patient in reverse Trendelenburg.  +---------+---------------+---------+-----------+----------+-------------------+ RIGHT    CompressibilityPhasicitySpontaneityPropertiesThrombus Aging       +---------+---------------+---------+-----------+----------+-------------------+ CFV      Full           Yes      Yes                  sluggish flow noted +---------+---------------+---------+-----------+----------+-------------------+ SFJ      Full                                                             +---------+---------------+---------+-----------+----------+-------------------+ FV Prox  Full                                         sluggish flow noted +---------+---------------+---------+-----------+----------+-------------------+ FV Mid   Full                                                             +---------+---------------+---------+-----------+----------+-------------------+ FV DistalFull                                                             +---------+---------------+---------+-----------+----------+-------------------+ PFV      Full                                         sluggish flow noted +---------+---------------+---------+-----------+----------+-------------------+ POP      Full                                                             +---------+---------------+---------+-----------+----------+-------------------+ PTV                                                   patent by color     +---------+---------------+---------+-----------+----------+-------------------+ PERO  patent by color     +---------+---------------+---------+-----------+----------+-------------------+   +---------+---------------+---------+-----------+----------+---------------+ LEFT     CompressibilityPhasicitySpontaneityPropertiesThrombus Aging  +---------+---------------+---------+-----------+----------+---------------+ CFV      Full           Yes      Yes                  sluggish flow   +---------+---------------+---------+-----------+----------+---------------+ SFJ      Full                                                          +---------+---------------+---------+-----------+----------+---------------+ FV Prox  Full                                                         +---------+---------------+---------+-----------+----------+---------------+ FV Mid   Full                                                         +---------+---------------+---------+-----------+----------+---------------+ FV DistalFull                                                         +---------+---------------+---------+-----------+----------+---------------+ PFV      Full                                                         +---------+---------------+---------+-----------+----------+---------------+ POP                     Yes      Yes                  patent by color +---------+---------------+---------+-----------+----------+---------------+ PTV      Full                                                         +---------+---------------+---------+-----------+----------+---------------+ PERO     Full                                                         +---------+---------------+---------+-----------+----------+---------------+     Summary: BILATERAL: - No evidence of deep vein thrombosis seen in the lower extremities, bilaterally. -   *See table(s) above for measurements and observations. Electronically signed by Sherald Hesshristopher Clark MD on 12/26/2020 at 2:28:21 PM.    Final  TODAY-DAY OF DISCHARGE:  Subjective:   Gary Mcneil today has no headache,no chest abdominal pain,no new weakness tingling or numbness, feels much better wants to go home today.  Objective:   Blood pressure 93/72, pulse 68, temperature 98.3 F (36.8 C), temperature source Axillary, resp. rate 18, height 6' (1.829 m), weight 81.5 kg, SpO2 90 %.  Intake/Output Summary (Last 24 hours) at 12/30/2020 0935 Last data filed at 12/30/2020 0929 Gross per 24 hour  Intake 800 ml   Output 1000 ml  Net -200 ml   Filed Weights   12/27/20 0500 12/28/20 0500 12/29/20 0500  Weight: 81.4 kg 81.4 kg 81.5 kg    Exam: Awake Alert, Oriented *3, No new F.N deficits, Normal affect [place].AT,PERRAL Supple Neck,No JVD, No cervical lymphadenopathy appriciated.  Symmetrical Chest wall movement, Good air movement bilaterally, CTAB RRR,No Gallops,Rubs or new Murmurs, No Parasternal Heave +ve B.Sounds, Abd Soft, Non tender, No organomegaly appriciated, No rebound -guarding or rigidity. No Cyanosis, Clubbing or edema, No new Rash or bruise   PERTINENT RADIOLOGIC STUDIES: CT Angio Chest PE W and/or Wo Contrast  Addendum Date: 12/24/2020   ADDENDUM REPORT: 12/24/2020 13:30 ADDENDUM: Critical Value/emergent results were called by telephone at the time of interpretation on 12/24/2020 at 1:24 pm to provider JOSHUA ZAVITZ , who verbally acknowledged these results. Electronically Signed   By: Delbert Phenix M.D.   On: 12/24/2020 13:30   Result Date: 12/24/2020 CLINICAL DATA:  COVID positive.  Hypoxia.  Dyspnea.  Tachycardia. EXAM: CT ANGIOGRAPHY CHEST WITH CONTRAST TECHNIQUE: Multidetector CT imaging of the chest was performed using the standard protocol during bolus administration of intravenous contrast. Multiplanar CT image reconstructions and MIPs were obtained to evaluate the vascular anatomy. CONTRAST:  77mL OMNIPAQUE IOHEXOL 350 MG/ML SOLN COMPARISON:  Chest radiograph from earlier today. 04/20/2015 chest CT angiogram. FINDINGS: Cardiovascular: The study is moderate quality for the evaluation of pulmonary embolism, with some motion degradation. Acute segmental and subsegmental anterior left lower lobe pulmonary emboli (series 6/image 161). Tiny subsegmental medial right lower lobe pulmonary embolus (series 6/image 188). No central pulmonary emboli. Atherosclerotic nonaneurysmal thoracic aorta. Markedly dilated main pulmonary artery (4.9 cm diameter), unchanged. Normal heart size. No significant  pericardial fluid/thickening. Three-vessel coronary atherosclerosis. Mediastinum/Nodes: No discrete thyroid nodules. Unremarkable esophagus. No pathologically enlarged axillary, mediastinal or hilar lymph nodes. Lungs/Pleura: No pneumothorax. No pleural effusion. Moderate centrilobular and paraseptal emphysema. Extensive patchy ground-glass opacity throughout both lungs involving all lung lobes, new. No lung masses or significant pulmonary nodules. Upper abdomen: Simple 2.5 cm medial upper left renal cyst. Musculoskeletal: No aggressive appearing focal osseous lesions. Mild thoracic spondylosis. Review of the MIP images confirms the above findings. IMPRESSION: 1. Acute segmental and subsegmental bilateral lower lobe pulmonary emboli. No central pulmonary emboli. 2. Extensive patchy ground-glass opacity throughout both lungs involving all lung lobes, new, compatible with COVID-19 pneumonia. 3. Markedly dilated main pulmonary artery, unchanged, suggesting chronic pulmonary arterial hypertension. 4. Three-vessel coronary atherosclerosis. 5. Aortic Atherosclerosis (ICD10-I70.0) and Emphysema (ICD10-J43.9). Electronically Signed: By: Delbert Phenix M.D. On: 12/24/2020 13:28   DG Chest Port 1 View  Result Date: 12/27/2020 CLINICAL DATA:  80 year old male with shortness of breath EXAM: PORTABLE CHEST 1 VIEW COMPARISON:  Chest radiograph dated 12/24/2020. chest CT dated 12/24/2020 FINDINGS: Bilateral mid to lower lung field patchy and streaky densities as seen on the prior CT and similar or slightly progressed since the prior radiograph and in keeping with multilobar pneumonia and in keeping with COVID-19. No pleural effusion pneumothorax. Stable cardiac  silhouette. No acute osseous pathology. IMPRESSION: Multilobar pneumonia similar or slightly progressed since the prior radiograph. Electronically Signed   By: Elgie Collard M.D.   On: 12/27/2020 19:43   DG Chest Portable 1 View  Result Date: 12/24/2020 CLINICAL  DATA:  Shortness of breath. EXAM: PORTABLE CHEST 1 VIEW COMPARISON:  May 26, 2015. FINDINGS: The heart size and mediastinal contours are within normal limits. Both lungs are clear. No pneumothorax or pleural effusion is noted. The visualized skeletal structures are unremarkable. IMPRESSION: No active disease. Electronically Signed   By: Lupita Raider M.D.   On: 12/24/2020 09:46   ECHOCARDIOGRAM COMPLETE  Result Date: 12/25/2020    ECHOCARDIOGRAM REPORT   Patient Name:   Gary Mcneil Date of Exam: 12/25/2020 Medical Rec #:  [id number]       Height:       72.0 in Accession #:    [id number]      Weight:       177.0 lb Date of Birth:  [birth year]        BSA:          2.023 m Patient Age:    79 years        BP:           147/88 mmHg Patient Gender: M               HR:           92 bpm. Exam Location:  Inpatient Procedure: 2D Echo, Cardiac Doppler and Color Doppler Indications:    I50.31 Acute diastolic (congestive) heart failure  History:        Patient has prior history of Echocardiogram examinations, most                 recent 02/26/2020. Covid 19 positive.  Sonographer:    Roosvelt Maser RDCS Referring Phys: [id number] EKTA V PATEL  Sonographer Comments: No parasternal window and Technically difficult study due to poor echo windows. Covid 19 coughing. IMPRESSIONS  1. Left ventricular ejection fraction, by estimation, is 30 to 35%. The left ventricle has moderately decreased function. The left ventricle demonstrates global hypokinesis. There is mild left ventricular hypertrophy. Left ventricular diastolic function  could not be evaluated. There is severe hypokinesis of the left ventricular, mid-apical anterior wall, apical segment and anteroseptal wall.  2. Right ventricular systolic function is mildly reduced. The right ventricular size is normal. There is normal pulmonary artery systolic pressure.  3. The mitral valve was not assessed. No evidence of mitral valve regurgitation.  4. Tricuspid valve regurgitation is mild to  moderate.  5. The aortic valve was not assessed. Aortic valve regurgitation is not visualized.  6. The inferior vena cava is dilated in size with >50% respiratory variability, suggesting right atrial pressure of 8 mmHg. Comparison(s): Changes from prior study are noted. 02/26/2020: LVEF 25-30%, global hypokinesis. FINDINGS  Left Ventricle: Left ventricular ejection fraction, by estimation, is 30 to 35%. The left ventricle has moderately decreased function. The left ventricle demonstrates global hypokinesis. Severe hypokinesis of the left ventricular, mid-apical anterior wall, apical segment and anteroseptal wall. The left ventricular internal cavity size was normal in size. There is mild left ventricular hypertrophy. Left ventricular diastolic function could not be evaluated. Right Ventricle: The right ventricular size is normal. No increase in right ventricular wall thickness. Right ventricular systolic function is mildly reduced. There is normal pulmonary artery systolic pressure. The tricuspid regurgitant velocity is 2.29 m/s, and with an assumed right  atrial pressure of 8 mmHg, the estimated right ventricular systolic pressure is 29.0 mmHg. Left Atrium: Left atrial size was normal in size. Right Atrium: Right atrial size was normal in size. Pericardium: There is no evidence of pericardial effusion. Mitral Valve: The mitral valve was not assessed. No evidence of mitral valve regurgitation. Tricuspid Valve: The tricuspid valve is grossly normal. Tricuspid valve regurgitation is mild to moderate. Aortic Valve: The aortic valve was not assessed. Aortic valve regurgitation is not visualized. Pulmonic Valve: The pulmonic valve was not assessed. Pulmonic valve regurgitation is not visualized. Aorta: Aortic root could not be assessed. Venous: The inferior vena cava is dilated in size with greater than 50% respiratory variability, suggesting right atrial pressure of 8 mmHg. IAS/Shunts: No atrial level shunt detected by  color flow Doppler.  IVC IVC diam: 2.10 cm LEFT ATRIUM           Index       RIGHT ATRIUM           Index LA Vol (A4C): 43.7 ml 21.60 ml/m RA Area:     12.20 cm                                   RA Volume:   30.90 ml  15.28 ml/m  TRICUSPID VALVE TR Peak grad:   21.0 mmHg TR Vmax:        229.00 cm/s Zoila Shutter MD Electronically signed by Zoila Shutter MD Signature Date/Time: 12/25/2020/4:34:18 PM    Final    VAS [place] LOWER EXTREMITY VENOUS (DVT)  Result Date: 12/26/2020  Lower Venous DVT Study Indications: Covid-19, elevated D-Dimer.  Limitations: Body habitus. Comparison Study: Prior negative left LEV duplex is available from 04/18/12 Performing Technologist: Sherren Kerns RVS  Examination Guidelines: A complete evaluation includes B-mode imaging, spectral Doppler, color Doppler, and power Doppler as needed of all accessible portions of each vessel. Bilateral testing is considered an integral part of a complete examination. Limited examinations for reoccurring indications may be performed as noted. The reflux portion of the exam is performed with the patient in reverse Trendelenburg.  +---------+---------------+---------+-----------+----------+-------------------+ RIGHT    CompressibilityPhasicitySpontaneityPropertiesThrombus Aging      +---------+---------------+---------+-----------+----------+-------------------+ CFV      Full           Yes      Yes                  sluggish flow noted +---------+---------------+---------+-----------+----------+-------------------+ SFJ      Full                                                             +---------+---------------+---------+-----------+----------+-------------------+ FV Prox  Full                                         sluggish flow noted +---------+---------------+---------+-----------+----------+-------------------+ FV Mid   Full                                                              +---------+---------------+---------+-----------+----------+-------------------+  FV DistalFull                                                             +---------+---------------+---------+-----------+----------+-------------------+ PFV      Full                                         sluggish flow noted +---------+---------------+---------+-----------+----------+-------------------+ POP      Full                                                             +---------+---------------+---------+-----------+----------+-------------------+ PTV                                                   patent by color     +---------+---------------+---------+-----------+----------+-------------------+ PERO                                                  patent by color     +---------+---------------+---------+-----------+----------+-------------------+   +---------+---------------+---------+-----------+----------+---------------+ LEFT     CompressibilityPhasicitySpontaneityPropertiesThrombus Aging  +---------+---------------+---------+-----------+----------+---------------+ CFV      Full           Yes      Yes                  sluggish flow   +---------+---------------+---------+-----------+----------+---------------+ SFJ      Full                                                         +---------+---------------+---------+-----------+----------+---------------+ FV Prox  Full                                                         +---------+---------------+---------+-----------+----------+---------------+ FV Mid   Full                                                         +---------+---------------+---------+-----------+----------+---------------+ FV DistalFull                                                         +---------+---------------+---------+-----------+----------+---------------+  PFV      Full                                                          +---------+---------------+---------+-----------+----------+---------------+ POP                     Yes      Yes                  patent by color +---------+---------------+---------+-----------+----------+---------------+ PTV      Full                                                         +---------+---------------+---------+-----------+----------+---------------+ PERO     Full                                                         +---------+---------------+---------+-----------+----------+---------------+     Summary: BILATERAL: - No evidence of deep vein thrombosis seen in the lower extremities, bilaterally. -   *See table(s) above for measurements and observations. Electronically signed by Sherald Hess MD on 12/26/2020 at 2:28:21 PM.    Final      PERTINENT LAB RESULTS: CBC: Recent Labs    12/28/20 0215 12/29/20 0033  WBC 10.9* 9.3  HGB 16.5 16.9  HCT 44.5 46.0  PLT 394 420*   CMET CMP     Component Value Date/Time   NA 137 12/30/2020 0107   NA 142 05/05/2016 1554   K 3.9 12/30/2020 0107   CL 100 12/30/2020 0107   CO2 26 12/30/2020 0107   GLUCOSE 155 (H) 12/30/2020 0107   BUN 45 (H) 12/30/2020 0107   BUN 12 05/05/2016 1554   CREATININE 1.38 (H) 12/30/2020 0107   CREATININE 1.14 02/09/2020 1027   CALCIUM 9.0 12/30/2020 0107   PROT 6.0 (L) 12/30/2020 0107   PROT 6.8 05/05/2016 1554   ALBUMIN 2.8 (L) 12/30/2020 0107   ALBUMIN 4.5 05/05/2016 1554   AST 18 12/30/2020 0107   ALT 17 12/30/2020 0107   ALKPHOS 54 12/30/2020 0107   BILITOT 0.5 12/30/2020 0107   BILITOT 0.7 05/05/2016 1554   GFRNONAA 53 (L) 12/30/2020 0107   GFRNONAA 62 02/09/2020 1027   GFRAA 72 02/09/2020 1027    GFR Estimated Creatinine Clearance: 49.2 mL/min (A) (by C-G formula based on SCr of 1.38 mg/dL (H)). No results for input(s): LIPASE, AMYLASE in the last 72 hours. No results for input(s): CKTOTAL, CKMB, CKMBINDEX, TROPONINI in the last 72  hours. Invalid input(s): POCBNP Recent Labs    12/28/20 0215 12/29/20 0033  DDIMER 4.67* 2.94*   No results for input(s): HGBA1C in the last 72 hours. No results for input(s): CHOL, HDL, LDLCALC, TRIG, CHOLHDL, LDLDIRECT in the last 72 hours. No results for input(s): TSH, T4TOTAL, T3FREE, THYROIDAB in the last 72 hours.  Invalid input(s): FREET3 Recent Labs    12/28/20 0215 12/29/20 0033  FERRITIN 1,217* 967*   Coags: No results for input(s): INR  in the last 72 hours.  Invalid input(s): PT Microbiology: Recent Results (from the past 240 hour(s))  SARS Coronavirus 2 by RT PCR (hospital order, performed in [facility] hospital lab) Nasopharyngeal Nasopharyngeal Swab     Status: Abnormal   Collection Time: 12/24/20  9:20 AM   Specimen: Nasopharyngeal Swab  Result Value Ref Range Status   SARS Coronavirus 2 POSITIVE (A) NEGATIVE Final    Comment: RESULT CALLED TO, READ BACK BY AND VERIFIED WITH: RN S.KLONKET AT 1153 ON 12/24/2020 BY T.SAAD (NOTE) SARS-CoV-2 target nucleic acids are DETECTED  SARS-CoV-2 RNA is generally detectable in upper respiratory specimens  during the acute phase of infection.  Positive results are indicative  of the presence of the identified virus, but do not rule out bacterial infection or co-infection with other pathogens not detected by the test.  Clinical correlation with patient history and  other diagnostic information is necessary to determine patient infection status.  The expected result is negative.  Fact Sheet for Patients:   BoilerBrush.com.cy   Fact Sheet for Healthcare Providers:   https://pope.com/    This test is not yet approved or cleared by the [place] FDA and  has been authorized for detection and/or diagnosis of SARS-CoV-2 by FDA under an Emergency Use Authorization (EUA).  This EUA will remain in effect (meani ng this test can be used) for the duration of  the COVID-19  declaration under Section 564(b)(1) of the Act, 21 U.S.C. section 360-bbb-3(b)(1), unless the authorization is terminated or revoked sooner.  Performed at [facility], [address] [address], [place], [place] [address]     FURTHER DISCHARGE INSTRUCTIONS:  Get Medicines reviewed and adjusted: Please take all your medications with you for your next visit with your Primary MD  Laboratory/radiological data: Please request your Primary MD to go over all hospital tests and procedure/radiological results at the follow up, please ask your Primary MD to get all Hospital records sent to his/her office.  In some cases, they will be blood work, cultures and biopsy results pending at the time of your discharge. Please request that your primary care M.D. goes through all the records of your hospital data and follows up on these results.  Also Note the following: If you experience worsening of your admission symptoms, develop shortness of breath, life threatening emergency, suicidal or homicidal thoughts you must seek medical attention immediately by calling 911 or calling your MD immediately  if symptoms less severe.  You must read complete instructions/literature along with all the possible adverse reactions/side effects for all the Medicines you take and that have been prescribed to you. Take any new Medicines after you have completely understood and accpet all the possible adverse reactions/side effects.   Do not drive when taking Pain medications or sleeping medications (Benzodaizepines)  Do not take more than prescribed Pain, Sleep and Anxiety Medications. It is not advisable to combine anxiety,sleep and pain medications without talking with your primary care practitioner  Special Instructions: If you have smoked or chewed Tobacco  in the last 2 yrs please stop smoking, stop any regular Alcohol  and or any Recreational drug use.  Wear Seat belts while driving.  Please note: You were cared for  by a hospitalist during your hospital stay. Once you are discharged, your primary care physician will handle any further medical issues. Please note that NO REFILLS for any discharge medications will be authorized once you are discharged, as it is imperative that you return to your  primary care physician (or establish a relationship with a primary care physician if you do not have one) for your post hospital discharge needs so that they can reassess your need for medications and monitor your lab values.  Total Time spent coordinating discharge including counseling, education and face to face time equals 35 minutes.  SignedJeoffrey Massed 12/30/2020 9:35 AM

## 2020-12-30 NOTE — Progress Notes (Signed)
Patient was discharged home with home health by MD order; discharged instructions review and give to patient with care notes; IV DIC; skin intact; TOC pharmacy delivered patient's medicine in his room; oxygen and 3/1 were delivered to patient's room. Patient's son was called and informed that patient can be picked up around 13:00 o'clock and per son his sister will come to take patient's home. Patient will be escorted to the car by nurse tech via wheelchair.

## 2021-01-04 ENCOUNTER — Telehealth (HOSPITAL_COMMUNITY): Payer: Self-pay

## 2021-01-04 NOTE — Telephone Encounter (Signed)
Pharmacy Transitions of Care Follow-up Telephone Call  Date of discharge: 12/30/20 Discharge Diagnosis: COVID/PE  How have you been since you were released from the hospital? Spoke to son who manages medications. Patient has been well. On Eliquis starter pack but does not have refills. Knows s/sx of bleeding to look out for.  Medication changes made at discharge: yes   Medication changes obtained and verified? yes    Medication Accessibility:  . Home Pharmacy: not discussed with patient. No refills given at [facility] pharmacy and patient does not have follow up currently scheduled. Spoke with son and explained patient was still at risk for another clot after this month so he will make his father an appointment with his general practitioner for additional refills on his eliquis.    Medication Review:  APIXABEN (ELIQUIS)  Apixaban 10 mg BID initiated on 12/30/20. Will switch to apixaban 5 mg BID after 7 days.  - Discussed importance of taking medication around the same time everyday  - Advised patient of medications to avoid (NSAIDs, ASA)  - Educated that Tylenol (acetaminophen) will be the preferred analgesic to prevent risk of bleeding  - Emphasized importance of monitoring for signs and symptoms of bleeding (abnormal bruising, prolonged bleeding, nose bleeds, bleeding from gums, discolored urine, black tarry stools)  - Advised patient to alert all providers of anticoagulation therapy prior to starting a new medication or having a procedure   Follow-up Appointments:  Son will schedule follow up with primary care provider now that he know patient should continue on his Eliquis. Will get refills at that visit.  If their condition worsens, is the pt aware to call PCP or go to the Emergency Dept.? yes  Final Patient Assessment: Patient is doing well. Son is helping manage meds. Will schedule follow up with PCP for more refills.

## 2021-01-11 ENCOUNTER — Emergency Department (HOSPITAL_COMMUNITY): Payer: Medicare Other

## 2021-01-11 ENCOUNTER — Encounter (HOSPITAL_COMMUNITY): Payer: Self-pay

## 2021-01-11 ENCOUNTER — Inpatient Hospital Stay (HOSPITAL_COMMUNITY)
Admission: EM | Admit: 2021-01-11 | Discharge: 2021-01-18 | DRG: 291 | Disposition: A | Discharge status: Skilled Nursing Facility | Payer: Medicare Other | Attending: Internal Medicine | Admitting: Internal Medicine

## 2021-01-11 DIAGNOSIS — I10 Essential (primary) hypertension: Secondary | ICD-10-CM | POA: Diagnosis present

## 2021-01-11 DIAGNOSIS — F1090 Alcohol use, unspecified, uncomplicated: Secondary | ICD-10-CM

## 2021-01-11 DIAGNOSIS — Z7901 Long term (current) use of anticoagulants: Secondary | ICD-10-CM

## 2021-01-11 DIAGNOSIS — J439 Emphysema, unspecified: Secondary | ICD-10-CM | POA: Diagnosis present

## 2021-01-11 DIAGNOSIS — F1010 Alcohol abuse, uncomplicated: Secondary | ICD-10-CM | POA: Diagnosis present

## 2021-01-11 DIAGNOSIS — U071 COVID-19: Secondary | ICD-10-CM

## 2021-01-11 DIAGNOSIS — Z7289 Other problems related to lifestyle: Secondary | ICD-10-CM

## 2021-01-11 DIAGNOSIS — J9601 Acute respiratory failure with hypoxia: Secondary | ICD-10-CM | POA: Diagnosis present

## 2021-01-11 DIAGNOSIS — I2510 Atherosclerotic heart disease of native coronary artery without angina pectoris: Secondary | ICD-10-CM | POA: Diagnosis present

## 2021-01-11 DIAGNOSIS — Z87891 Personal history of nicotine dependence: Secondary | ICD-10-CM

## 2021-01-11 DIAGNOSIS — N179 Acute kidney failure, unspecified: Secondary | ICD-10-CM | POA: Diagnosis present

## 2021-01-11 DIAGNOSIS — E1140 Type 2 diabetes mellitus with diabetic neuropathy, unspecified: Secondary | ICD-10-CM

## 2021-01-11 DIAGNOSIS — Z789 Other specified health status: Secondary | ICD-10-CM

## 2021-01-11 DIAGNOSIS — Z8249 Family history of ischemic heart disease and other diseases of the circulatory system: Secondary | ICD-10-CM

## 2021-01-11 DIAGNOSIS — Z880 Allergy status to penicillin: Secondary | ICD-10-CM

## 2021-01-11 DIAGNOSIS — I5043 Acute on chronic combined systolic (congestive) and diastolic (congestive) heart failure: Secondary | ICD-10-CM | POA: Diagnosis present

## 2021-01-11 DIAGNOSIS — F0390 Unspecified dementia without behavioral disturbance: Secondary | ICD-10-CM | POA: Diagnosis present

## 2021-01-11 DIAGNOSIS — Z8616 Personal history of COVID-19: Secondary | ICD-10-CM

## 2021-01-11 DIAGNOSIS — J189 Pneumonia, unspecified organism: Secondary | ICD-10-CM

## 2021-01-11 DIAGNOSIS — K219 Gastro-esophageal reflux disease without esophagitis: Secondary | ICD-10-CM | POA: Diagnosis present

## 2021-01-11 DIAGNOSIS — R197 Diarrhea, unspecified: Secondary | ICD-10-CM | POA: Diagnosis present

## 2021-01-11 DIAGNOSIS — Z79899 Other long term (current) drug therapy: Secondary | ICD-10-CM

## 2021-01-11 DIAGNOSIS — G609 Hereditary and idiopathic neuropathy, unspecified: Secondary | ICD-10-CM | POA: Diagnosis present

## 2021-01-11 DIAGNOSIS — Z86711 Personal history of pulmonary embolism: Secondary | ICD-10-CM

## 2021-01-11 DIAGNOSIS — I110 Hypertensive heart disease with heart failure: Secondary | ICD-10-CM | POA: Diagnosis not present

## 2021-01-11 DIAGNOSIS — R0603 Acute respiratory distress: Secondary | ICD-10-CM | POA: Diagnosis not present

## 2021-01-11 DIAGNOSIS — J690 Pneumonitis due to inhalation of food and vomit: Secondary | ICD-10-CM | POA: Diagnosis present

## 2021-01-11 DIAGNOSIS — R339 Retention of urine, unspecified: Secondary | ICD-10-CM | POA: Diagnosis present

## 2021-01-11 DIAGNOSIS — I2699 Other pulmonary embolism without acute cor pulmonale: Secondary | ICD-10-CM | POA: Diagnosis present

## 2021-01-11 DIAGNOSIS — R310 Gross hematuria: Secondary | ICD-10-CM | POA: Diagnosis present

## 2021-01-11 DIAGNOSIS — J9621 Acute and chronic respiratory failure with hypoxia: Secondary | ICD-10-CM | POA: Diagnosis present

## 2021-01-11 DIAGNOSIS — R52 Pain, unspecified: Secondary | ICD-10-CM

## 2021-01-11 DIAGNOSIS — M109 Gout, unspecified: Secondary | ICD-10-CM | POA: Diagnosis present

## 2021-01-11 DIAGNOSIS — Z7984 Long term (current) use of oral hypoglycemic drugs: Secondary | ICD-10-CM

## 2021-01-11 LAB — CBC WITH DIFFERENTIAL/PLATELET
Abs Immature Granulocytes: 0.05 10*3/uL (ref 0.00–0.07)
Basophils Absolute: 0 10*3/uL (ref 0.0–0.1)
Basophils Relative: 0 %
Eosinophils Absolute: 0.3 10*3/uL (ref 0.0–0.5)
Eosinophils Relative: 3 %
HCT: 37 % — ABNORMAL LOW (ref 39.0–52.0)
Hemoglobin: 13.5 g/dL (ref 13.0–17.0)
Immature Granulocytes: 1 %
Lymphocytes Relative: 10 %
Lymphs Abs: 1 10*3/uL (ref 0.7–4.0)
MCH: 31.3 pg (ref 26.0–34.0)
MCHC: 36.5 g/dL — ABNORMAL HIGH (ref 30.0–36.0)
MCV: 85.8 fL (ref 80.0–100.0)
Monocytes Absolute: 0.8 10*3/uL (ref 0.1–1.0)
Monocytes Relative: 8 %
Neutro Abs: 8.5 10*3/uL — ABNORMAL HIGH (ref 1.7–7.7)
Neutrophils Relative %: 78 %
Platelets: 137 10*3/uL — ABNORMAL LOW (ref 150–400)
RBC: 4.31 MIL/uL (ref 4.22–5.81)
RDW: 15.2 % (ref 11.5–15.5)
WBC: 10.8 10*3/uL — ABNORMAL HIGH (ref 4.0–10.5)
nRBC: 0 % (ref 0.0–0.2)

## 2021-01-11 LAB — BRAIN NATRIURETIC PEPTIDE: B Natriuretic Peptide: 697.7 pg/mL — ABNORMAL HIGH (ref 0.0–100.0)

## 2021-01-11 LAB — COMPREHENSIVE METABOLIC PANEL
ALT: 25 U/L (ref 0–44)
AST: 32 U/L (ref 15–41)
Albumin: 2.7 g/dL — ABNORMAL LOW (ref 3.5–5.0)
Alkaline Phosphatase: 66 U/L (ref 38–126)
Anion gap: 10 (ref 5–15)
BUN: 18 mg/dL (ref 8–23)
CO2: 23 mmol/L (ref 22–32)
Calcium: 8.5 mg/dL — ABNORMAL LOW (ref 8.9–10.3)
Chloride: 106 mmol/L (ref 98–111)
Creatinine, Ser: 0.93 mg/dL (ref 0.61–1.24)
GFR, Estimated: >60 mL/min (ref >60)
Glucose, Bld: 126 mg/dL — ABNORMAL HIGH (ref 70–99)
Potassium: 4 mmol/L (ref 3.5–5.1)
Sodium: 139 mmol/L (ref 135–145)
Total Bilirubin: 1.1 mg/dL (ref 0.3–1.2)
Total Protein: 5.8 g/dL — ABNORMAL LOW (ref 6.5–8.1)

## 2021-01-11 LAB — TROPONIN I (HIGH SENSITIVITY)
Troponin I (High Sensitivity): 45 ng/L — ABNORMAL HIGH (ref <18)
Troponin I (High Sensitivity): 41 ng/L — ABNORMAL HIGH (ref <18)

## 2021-01-11 MED ORDER — VANCOMYCIN HCL 1750 MG/350ML IV SOLN
1750.0000 mg | Freq: Once | INTRAVENOUS | Status: DC
  Administered 2021-01-12: 1750 mg via INTRAVENOUS
  Filled 2021-01-11: qty 350

## 2021-01-11 MED ORDER — SODIUM CHLORIDE 0.9 % IV SOLN
2.0000 g | Freq: Three times a day (TID) | INTRAVENOUS | Status: DC
  Administered 2021-01-12: 2 g via INTRAVENOUS
  Filled 2021-01-11: qty 2

## 2021-01-11 MED ORDER — APIXABAN 5 MG PO TABS
5.0000 mg | ORAL_TABLET | Freq: Two times a day (BID) | ORAL | Status: DC
  Administered 2021-01-12 – 2021-01-18 (×14): 5 mg via ORAL
  Filled 2021-01-11 (×14): qty 1

## 2021-01-11 MED ORDER — IOHEXOL 350 MG/ML SOLN
80.0000 mL | Freq: Once | INTRAVENOUS | Status: AC | PRN
  Administered 2021-01-11: 80 mL via INTRAVENOUS

## 2021-01-11 MED ORDER — VANCOMYCIN HCL 1000 MG/200ML IV SOLN: 1000.0000 mg | Freq: Two times a day (BID) | INTRAVENOUS | Status: DC

## 2021-01-11 MED ORDER — IOHEXOL 350 MG/ML SOLN
105.0000 mL | Freq: Once | INTRAVENOUS | Status: AC | PRN
  Administered 2021-01-11: 105 mL via INTRAVENOUS

## 2021-01-11 NOTE — ED Provider Notes (Signed)
[facility] Provider Note   CSN: [id number] Arrival date & time: 01/11/21  1828     History Chief Complaint  Patient presents with  . Hypoxia    [facility] is a 80 y.o. male.  HPI      80 year old male with history of dementia, hypertension, type 2 diabetes, chronic systolic heart failure, recent mission for acute hypoxic respiratory failure due to COVID-19 and pulmonary embolus on 2 L of oxygen who presents with concern for fall at home, continued shortness of breath and fatigue.  Patient lives alone with family checking in on him.  He last had family check in at 49, and when they returned at 4 PM they found him down on the floor.  He appeared to be out of it.  Per EMS, his oxygen saturations were in the 60s on their arrival.  He reports he has had continued shortness of breath, particularly on exertion, and fatigue.  Denies chest pain, fever, vomiting, diarrhea, black or bloody stools.  He reports that when he fell, he did hit the back of his head, initially had a severe headache.  He is not sure exactly how long he was laying on the floor prior to his family coming in and finding him.  Reports he felt too exhausted to get up.  He thinks that he slipped, does not believe that he lost consciousness.  Past Medical History:  Diagnosis Date  . "walking corpse" syndrome   . Acute kidney failure, unspecified (HCC)   . Acute kidney failure, unspecified (HCC)   . Anemia, unspecified   . Arthritis   . Dementia   . Dementia (HCC) 04/24/2012  . Disorder of bone and cartilage, unspecified   . Dizziness and giddiness   . GERD (gastroesophageal reflux disease)   . Gout   . Gout, unspecified   . Hypertension   . Hypopotassemia   . Hypotension, unspecified   . Hypotension, unspecified   . Memory loss   . Memory loss   . Muscle weakness (generalized)   . Myalgia and myositis, unspecified   . Neuromuscular disorder (HCC)   . Other and  unspecified hyperlipidemia   . Screening for ischemic heart disease   . Tobacco use disorder   . Type II or unspecified type diabetes mellitus without mention of complication, uncontrolled   . Type II or unspecified type diabetes mellitus without mention of complication, uncontrolled   . Unspecified hereditary and idiopathic peripheral neuropathy   . Unspecified vitamin D deficiency     Patient Active Problem List   Diagnosis Date Noted  . Acute hypoxemic respiratory failure due to COVID-19 (HCC) 12/24/2020  . Pulmonary embolism (HCC) 12/24/2020  . Tobacco use disorder   . Hypertension   . GERD (gastroesophageal reflux disease)   . Acute kidney failure, unspecified (HCC)   . 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 Empyema lung (HCC) 04/30/2015  . Loculated pleural effusion   . Hyperglycemia   . Right lower lobe lung mass   . Lung mass 04/24/2015  . Lethargy   . Essential hypertension   . HLD (hyperlipidemia)   . Right-sided chest pain 04/20/2015  . Transaminitis 04/20/2015  . Positive D dimer 04/20/2015  . Acute respiratory failure with hypoxia (HCC) 04/20/2015  . Pleural effusion, right 04/20/2015  . Hypokalemia 04/20/2015  . Dementia without behavioral disturbance (HCC) 09/02/2014  . Type 2 diabetes mellitus with diabetic neuropathy (HCC) 05/20/2014  . Gout 04/29/2014  . Elevated liver  enzymes 01/27/2014  . Anemia 05/02/2012  . Constipation 04/24/2012  . Dementia (HCC) 04/24/2012  . Arthritis   . HTN (hypertension) 04/13/2012  . Hyperlipidemia 04/13/2012    Past Surgical History:  Procedure Laterality Date  . INGUINAL HERNIA REPAIR    . VIDEO ASSISTED THORACOSCOPY (VATS)/EMPYEMA Right 04/30/2015   Procedure: VIDEO ASSISTED THORACOSCOPY (VATS)/DRAIN EMPYEMA;  Surgeon: [name], MD;  Location: [facility] OR;  Service: Thoracic;  Laterality: Right;  [name] VIDEO BRONCHOSCOPY N/A 04/30/2015   Procedure: VIDEO BRONCHOSCOPY;  Surgeon: [name], MD;  Location: [facility] OR;   Service: Thoracic;  Laterality: N/A;       Family History  Problem Relation Age of Onset  . Diabetes Mother   . Hypertension Mother   . Diabetes Father   . Hypertension Father   . Kidney disease Father   . Diabetes Sister   . Kidney disease Brother     Social History   Tobacco Use  . Smoking status: Former Smoker    Quit date: 11/20/1969    Years since quitting: 51.1  . Smokeless tobacco: Never Used  Vaping Use  . Vaping Use: Never used  Substance Use Topics  . Alcohol use: Yes    Comment: daily- to every other day, combination or beer, wine, and liquor.per son patient drinks beer and liquor heavy  . Drug use: Not Currently    Types: Marijuana    Home Medications Prior to Admission medications   Medication Sig Start Date End Date Taking? Authorizing Provider  albuterol (VENTOLIN HFA) 108 (90 Base) MCG/ACT inhaler Inhale 2 puffs into the lungs every 6 (six) hours. Patient taking differently: Inhale 2 puffs into the lungs in the morning and at bedtime. 12/30/20  Yes Ghimire, Werner Lean, MD  allopurinol (ZYLOPRIM) 300 MG tablet Take 300 mg by mouth daily.   Yes [provider]  APIXABAN Everlene Balls) VTE STARTER PACK (10MG  AND 5MG ) Take as directed on package: start with two-5mg  tablets twice daily for 7 days. On day 8, switch to one-5mg  tablet twice daily. 12/30/20  Yes Ghimire, , MD  atorvastatin (LIPITOR) 40 MG tablet Take 1 tablet (40 mg total) by mouth at bedtime. 12/30/20  Yes Ghimire, Werner Lean, MD  benzonatate (TESSALON PERLES) 100 MG capsule Take 1 capsule (100 mg total) by mouth 3 (three) times daily as needed for cough. 12/30/20 12/30/21 Yes Ghimire, 02/27/21, MD  folic acid (FOLVITE) 1 MG tablet Take 1 tablet (1 mg total) by mouth daily. 12/31/20  Yes Ghimire, Werner Lean, MD  linagliptin (TRADJENTA) 5 MG TABS tablet Take 5 mg by mouth daily.   Yes [provider]  losartan (COZAAR) 100 MG tablet TAKE 1 TABLET(100 MG) BY MOUTH DAILY Patient taking  differently: Take 100 mg by mouth daily. 06/10/19  Yes Werner Lean, NP  Memantine HCl-Donepezil HCl (NAMZARIC) 28-10 MG CP24 Take 1 capsule by mouth daily. 06/10/19  Yes [name], NP  metFORMIN (GLUCOPHAGE) 500 MG tablet TAKE 1 TABLET BY MOUTH EVERY DAY WITH BREAKFAST Patient taking differently: Take 500 mg by mouth daily with breakfast. TAKE 1 TABLET BY MOUTH EVERY DAY WITH BREAKFAST 06/10/19  Yes [name], NP  metoprolol succinate (TOPROL-XL) 50 MG 24 hr tablet Take 1 tablet (50 mg total) by mouth daily. Take with or immediately following a meal. 12/31/20  Yes Ghimire, [name], MD  Multiple Vitamin (MULTIVITAMIN WITH MINERALS) TABS tablet Take 1 tablet by mouth daily.   Yes [provider]  pantoprazole (PROTONIX) 40 MG tablet Take 1 tablet (40 mg total) by mouth daily at 12 noon. 12/30/20  Yes Ghimire, Werner Lean, MD  potassium chloride SA (KLOR-CON) 20 MEQ tablet Take 3 tablets (60 mEq total) by mouth daily. 11/20/19  Yes [name], NP  thiamine 100 MG tablet Take 1 tablet (100 mg total) by mouth daily. 12/31/20  Yes Ghimire, Werner Lean, MD  aspirin 81 MG chewable tablet Chew 1 tablet (81 mg total) by mouth daily. Patient not taking: Reported on 01/11/2021 12/31/20   Maretta Bees, MD    Allergies    Penicillins  Review of Systems   Review of Systems  Constitutional: Positive for fatigue. Negative for fever.  HENT: Negative for sore throat.   Eyes: Negative for visual disturbance.  Respiratory: Positive for shortness of breath. Negative for cough.   Cardiovascular: Negative for chest pain.  Gastrointestinal: Negative for abdominal pain, nausea and vomiting.  Genitourinary: Negative for difficulty urinating.  Musculoskeletal: Negative for back pain and neck stiffness.  Skin: Negative for rash.  Neurological: Positive for light-headedness and headaches. Negative for syncope.    Physical Exam Updated Vital Signs BP 135/88   Pulse 91   Temp  98.3 F (36.8 C) (Oral)   Resp (!) 28   Ht 6' (1.829 m)   Wt 82 kg   SpO2 96%   BMI 24.52 kg/m   Physical Exam Vitals and nursing note reviewed.  Constitutional:      General: He is not in acute distress.    Appearance: He is well-developed and well-nourished. He is not diaphoretic.  HENT:     Head: Normocephalic and atraumatic.  Eyes:     Extraocular Movements: EOM normal.     Conjunctiva/sclera: Conjunctivae normal.  Cardiovascular:     Rate and Rhythm: Normal rate and regular rhythm.     Pulses: Intact distal pulses.     Heart sounds: Normal heart sounds. No murmur heard. No friction rub. No gallop.   Pulmonary:     Effort: Pulmonary effort is normal. No respiratory distress.     Breath sounds: Normal breath sounds. No wheezing or rales.  Abdominal:     General: There is no distension.     Palpations: Abdomen is soft.     Tenderness: There is no abdominal tenderness. There is no guarding.  Musculoskeletal:        General: No edema.     Cervical back: Normal range of motion.  Skin:    General: Skin is warm and dry.  Neurological:     Mental Status: He is alert and oriented to person, place, and time.     ED Results / Procedures / Treatments   Labs (all labs ordered are listed, but only abnormal results are displayed) Labs Reviewed  CBC WITH DIFFERENTIAL/PLATELET - Abnormal; Notable for the following components:      Result Value   WBC 10.8 (*)    HCT 37.0 (*)    MCHC 36.5 (*)    Platelets 137 (*)    Neutro Abs 8.5 (*)    All other components within normal limits  COMPREHENSIVE METABOLIC PANEL - Abnormal; Notable for the following components:   Glucose, Bld 126 (*)    Calcium 8.5 (*)    Total Protein 5.8 (*)    Albumin 2.7 (*)    All other components within normal limits  BRAIN NATRIURETIC PEPTIDE - Abnormal; Notable for the following components:   B Natriuretic Peptide 697.7 (*)  All other components within normal limits  TROPONIN I (HIGH SENSITIVITY)  - Abnormal; Notable for the following components:   Troponin I (High Sensitivity) 45 (*)    All other components within normal limits  TROPONIN I (HIGH SENSITIVITY) - Abnormal; Notable for the following components:   Troponin I (High Sensitivity) 41 (*)    All other components within normal limits  CULTURE, BLOOD (ROUTINE X 2)  CULTURE, BLOOD (ROUTINE X 2)  LACTIC ACID, PLASMA  LACTIC ACID, PLASMA  PROCALCITONIN    EKG EKG Interpretation  Date/Time:  Tuesday January 11 2021 20:46:48 EST Ventricular Rate:  108 PR Interval:    QRS Duration: 144 QT Interval:  358 QTC Calculation: 480 R Axis:   -130 Text Interpretation: Sinus tachycardia Atrial premature complexes Left atrial enlargement Nonspecific intraventricular conduction delay Since prior ECG<rate has increased Confirmed by Alvira Monday ([id number]) on 01/12/2021 1:52:29 AM   Radiology CT Head Wo Contrast  Result Date: 01/11/2021 CLINICAL DATA:  Status post fall.  Recent history of COVID positive. EXAM: CT HEAD WITHOUT CONTRAST TECHNIQUE: Contiguous axial images were obtained from the base of the skull through the vertex without intravenous contrast. COMPARISON:  December 30, 2014 FINDINGS: Brain: There is mild to moderate severity cerebral atrophy with widening of the extra-axial spaces and stable ventricular dilatation. There are areas of decreased attenuation within the white matter tracts of the supratentorial brain, consistent with microvascular disease changes. Vascular: No hyperdense vessel or unexpected calcification. Skull: Negative for an acute fracture. A stable 9 mm x 5 mm benign-appearing sclerotic focus is seen along the vertex on the right (axial CT image 73, CT series number 4). Sinuses/Orbits: No acute finding. Other: None. IMPRESSION: 1. Generalized cerebral atrophy with stable ventriculomegaly. A component of superimposed communicating hydrocephalus cannot be excluded. 2. No acute intracranial abnormality.  Electronically Signed   By: Aram Candela M.D.   On: 01/11/2021 20:34   CT Angio Chest PE W and/or Wo Contrast  Result Date: 01/11/2021 CLINICAL DATA:  Weakness and shortness of breath after discharge for COVID 19 EXAM: CT ANGIOGRAPHY CHEST WITH CONTRAST TECHNIQUE: Multidetector CT imaging of the chest was performed using the standard protocol during bolus administration of intravenous contrast. Multiplanar CT image reconstructions and MIPs were obtained to evaluate the vascular anatomy. CONTRAST:  80mL OMNIPAQUE IOHEXOL 350 MG/ML SOLN, OMNIPAQUE IOHEXOL 350 MG/ML SOLN COMPARISON:  CT 12/24/2020 FINDINGS: Cardiovascular: Initial contrast administration resulting in suboptimal bolus timing significantly limiting detection of pulmonary artery emboli. Repeat imaging was performed with a slightly improved central pulmonary artery attenuation though port technique results in persistently suboptimal distal opacification beyond the left and right main pulmonary arteries with exam further degraded by extensive respiratory motion artifact. No large central pulmonary artery filling defects are identified. A previously seen subsegmental filling defect in the right lower lobe is beyond the limits of detection on this study. Central pulmonary arteries are notably enlarged though similar to comparison. Normal heart size. No pericardial effusion. Three-vessel coronary artery atherosclerosis. Atherosclerotic plaque within the normal caliber aorta. Suboptimal opacification for a luminal assessment of the aorta. No gross aortic abnormality is discernible. Normal 3 vessel branching of the great vessels. Minimal plaque in the proximal great vessels. Mediastinum/Nodes: No mediastinal fluid or gas. Normal thyroid gland and thoracic inlet. No acute abnormality of the trachea or esophagus. No worrisome mediastinal, hilar or axillary adenopathy. Lungs/Pleura: Diffuse heterogeneous opacities again seen throughout the lungs,  with increasingly coalescent consolidative opacity in the regions of previously seen ground-glass  compatible with a worsening infectious process. Findings on a background of centrilobular and paraseptal emphysematous change. Diffuse airways thickening and scattered secretions are noted. No pneumothorax. Small left pleural effusion is present. Dependent atelectatic features bilaterally. Upper Abdomen: Stable fluid attenuation cyst in the upper pole left kidney. No acute abnormalities present in the visualized portions of the upper abdomen. Musculoskeletal: Degenerative changes are present in the imaged spine and shoulders. No acute osseous abnormality or suspicious osseous lesion. No worrisome chest wall masses or lesions. Review of the MIP images confirms the above findings. IMPRESSION: 1. Markedly suboptimal pulmonary artery assessment given technical factors as detailed above. No large central filling defects are seen. A previously seen subsegmental filling defect in the right lower lobe is beyond the limits of detection. Chronic pulmonary artery enlargement most compatible with pulmonary artery hypertension. 2. Diffuse heterogeneous opacities again seen throughout the lungs, with increasingly coalescent consolidative opacity in the regions of previously seen ground-glass compatible with a worsening infectious process. 3. Small left pleural effusion. 4. Three-vessel coronary artery atherosclerosis. 5.  Emphysema (ICD10-J43.9). 6. Aortic Atherosclerosis (ICD10-I70.0) Electronically Signed   By: Kreg ShropshirePrice  DeHay M.D.   On: 01/11/2021 22:49   CT Cervical Spine Wo Contrast  Result Date: 01/11/2021 CLINICAL DATA:  Status post fall. EXAM: CT CERVICAL SPINE WITHOUT CONTRAST TECHNIQUE: Multidetector CT imaging of the cervical spine was performed without intravenous contrast. Multiplanar CT image reconstructions were also generated. COMPARISON:  None. FINDINGS: Alignment: There is approximately 2 mm anterolisthesis of the  C2 vertebral body on C3. Skull base and vertebrae: No acute fracture. Chronic changes are seen along the tip of the dens. Soft tissues and spinal canal: No prevertebral fluid or swelling. No visible canal hematoma. Disc levels: Marked severity endplate sclerosis and anterior osteophyte formation is seen at the levels of C3-C4, C4-C5, C5-C6 and C6-C7. Moderate severity intervertebral disc space narrowing is also seen at the levels of C4-C5 and C5-C6. Marked severity intervertebral disc space narrowing is present at the level of C6-C7. Marked severity bilateral multilevel facet joint hypertrophy is noted. Upper chest: Negative. Other: None. IMPRESSION: 1. Marked severity multilevel degenerative changes, most prominent at the levels of C3-C4, C4-C5, C5-C6 and C6-C7. 2. No acute cervical spine fracture. Electronically Signed   By: Aram Candelahaddeus  Houston M.D.   On: 01/11/2021 20:38   DG Chest Portable 1 View  Result Date: 01/11/2021 CLINICAL DATA:  80 year old male with fall. EXAM: PORTABLE CHEST 1 VIEW COMPARISON:  Chest radiograph dated 12/27/2020. FINDINGS: Background of emphysema. Diffuse interstitial coarsening primarily involving the mid to lower lung field and peripherally. Interval progression of associated airspace densities since the prior radiograph concerning for worsening pneumonia. Clinical correlation is recommended. No large pleural effusion. No pneumothorax. Stable cardiac silhouette. No acute osseous pathology. IMPRESSION: 1. Findings concerning for worsening pneumonia. Clinical correlation is recommended. 2. Emphysema. Electronically Signed   By: Elgie CollardArash  Radparvar M.D.   On: 01/11/2021 20:05    Procedures Procedures   Medications Ordered in ED Medications  ceFEPIme (MAXIPIME) 2 g in sodium chloride 0.9 % 100 mL IVPB (2 g Intravenous New Bag/Given 01/12/21 0047)  vancomycin (VANCOREADY) IVPB 1750 mg/350 mL (1,750 mg Intravenous New Bag/Given 01/12/21 0134)    Followed by  vancomycin (VANCOREADY)  IVPB 1000 mg/200 mL (has no administration in time range)  apixaban (ELIQUIS) tablet 5 mg (5 mg Oral Given 01/12/21 0051)  iohexol (OMNIPAQUE) 350 MG/ML injection 80 mL (80 mLs Intravenous Contrast Given 01/11/21 2144)  iohexol (OMNIPAQUE) 350 MG/ML injection 105 mL (  105 mLs Intravenous Contrast Given 01/11/21 2239)    ED Course  I have reviewed the triage vital signs and the nursing notes.  Pertinent labs & imaging results that were available during my care of the patient were reviewed by me and considered in my medical decision making (see chart for details).    MDM Rules/Calculators/A&P                          80 year old male with history of dementia, hypertension, type 2 diabetes, chronic systolic heart failure, recent mission for acute hypoxic respiratory failure due to COVID-19 and pulmonary embolus on eliquis and 2 L of oxygen who presents with concern for fall at home, continued shortness of breath and fatigue, oxygen saturations 60 on EMS arrival on home 2L.  atient presents as a level trauma as a fall while on Eliquis.  X-ray shows worsening opacities.  CT head and cervical spine done showing no sign of acute injury.  CT PE study done to evaluate for signs of worsening PE, and further evaluation of lungs is also concerning for increasingly coalescent consolidative opacities.   Given increased oxygen demand, will admit for hypoxia secondary to pneumonia superimposed on COVID 19.  Given vancomycin and cefepime.   Final Clinical Impression(s) / ED Diagnoses Final diagnoses:  HCAP (healthcare-associated pneumonia)  COVID-19    Rx / DC Orders ED Discharge Orders    None       Alvira Monday, MD 01/12/21 [phone]

## 2021-01-11 NOTE — Progress Notes (Signed)
Pharmacy Antibiotic Note  Gary Mcneil is a 80 y.o. male admitted on 01/11/2021 with sepsis.  Pharmacy has been consulted for Cefepime and Vancomycin dosing.   Height: 6' (182.9 cm) Weight: 82 kg (180 lb 12.4 oz) IBW/kg (Calculated) : 77.6  Temp (24hrs), Avg:98.3 F (36.8 C), Min:97.6 F (36.4 C), Max:98.9 F (37.2 C)  Recent Labs  Lab 01/11/21 2020  WBC 10.8*  CREATININE 0.93    Estimated Creatinine Clearance: 73 mL/min (by C-G formula based on SCr of 0.93 mg/dL).    Allergies  Allergen Reactions  . Penicillins Rash    Antimicrobials this admission: 2/22 Cefepime >>  2/22 Vancomycin >>   Dose adjustments this admission: N/a  Microbiology results: Pending   Plan:  - Cefepime 2g IV q8h - Vancomycin 1750mg  IV x 1 dose  - Followed by Vancomycin 1000mg  IV q12h - Est Calc AUC 521 - Monitor patients renal function and urine output  - De-escalate ABX when appropriate   Thank you for allowing pharmacy to be a part of this patient's care.  PharmD. BCPS 01/11/2021 9:59 PM

## 2021-01-11 NOTE — ED Notes (Signed)
Pt transported to radiology at this time °

## 2021-01-11 NOTE — Progress Notes (Signed)
Orthopedic Tech Progress Note Patient Details:  Gary Mcneil February 15, 1941 [id number] Level 2 Trauma Patient ID: Gary Mcneil, male   DOB: [birth year], 80 y.o.   MRN: [id number]   Gary Mcneil 01/11/2021, 8:59 PM

## 2021-01-11 NOTE — ED Triage Notes (Signed)
Pt BIB [facility] for eval of weakness/SOB since being discharged from here about a week ago after being treated for covid. Daughter found him laying on ground this afternoon around 1700 this evening. Satting in the 60s on EMS arrival. Pt on 2LPM home O2 since covid dx. Pt fell and struck head, on eliquis.

## 2021-01-12 ENCOUNTER — Other Ambulatory Visit: Payer: Self-pay

## 2021-01-12 ENCOUNTER — Observation Stay (HOSPITAL_COMMUNITY): Payer: Medicare Other

## 2021-01-12 ENCOUNTER — Encounter (HOSPITAL_COMMUNITY): Payer: Self-pay | Admitting: Internal Medicine

## 2021-01-12 ENCOUNTER — Inpatient Hospital Stay (HOSPITAL_COMMUNITY): Payer: Medicare Other

## 2021-01-12 DIAGNOSIS — J9601 Acute respiratory failure with hypoxia: Secondary | ICD-10-CM | POA: Diagnosis not present

## 2021-01-12 DIAGNOSIS — U071 COVID-19: Secondary | ICD-10-CM

## 2021-01-12 DIAGNOSIS — I10 Essential (primary) hypertension: Secondary | ICD-10-CM

## 2021-01-12 DIAGNOSIS — I5043 Acute on chronic combined systolic (congestive) and diastolic (congestive) heart failure: Secondary | ICD-10-CM

## 2021-01-12 DIAGNOSIS — R0603 Acute respiratory distress: Secondary | ICD-10-CM | POA: Diagnosis present

## 2021-01-12 DIAGNOSIS — R197 Diarrhea, unspecified: Secondary | ICD-10-CM | POA: Diagnosis present

## 2021-01-12 DIAGNOSIS — F0390 Unspecified dementia without behavioral disturbance: Secondary | ICD-10-CM

## 2021-01-12 DIAGNOSIS — F1090 Alcohol use, unspecified, uncomplicated: Secondary | ICD-10-CM

## 2021-01-12 DIAGNOSIS — Z8249 Family history of ischemic heart disease and other diseases of the circulatory system: Secondary | ICD-10-CM | POA: Diagnosis not present

## 2021-01-12 DIAGNOSIS — Z7289 Other problems related to lifestyle: Secondary | ICD-10-CM | POA: Diagnosis not present

## 2021-01-12 DIAGNOSIS — I2782 Chronic pulmonary embolism: Secondary | ICD-10-CM

## 2021-01-12 DIAGNOSIS — E1140 Type 2 diabetes mellitus with diabetic neuropathy, unspecified: Secondary | ICD-10-CM | POA: Diagnosis present

## 2021-01-12 DIAGNOSIS — R339 Retention of urine, unspecified: Secondary | ICD-10-CM | POA: Diagnosis present

## 2021-01-12 DIAGNOSIS — Z880 Allergy status to penicillin: Secondary | ICD-10-CM | POA: Diagnosis not present

## 2021-01-12 DIAGNOSIS — F1010 Alcohol abuse, uncomplicated: Secondary | ICD-10-CM | POA: Diagnosis present

## 2021-01-12 DIAGNOSIS — Z86711 Personal history of pulmonary embolism: Secondary | ICD-10-CM | POA: Diagnosis not present

## 2021-01-12 DIAGNOSIS — Z8616 Personal history of COVID-19: Secondary | ICD-10-CM | POA: Diagnosis not present

## 2021-01-12 DIAGNOSIS — J690 Pneumonitis due to inhalation of food and vomit: Secondary | ICD-10-CM | POA: Diagnosis present

## 2021-01-12 DIAGNOSIS — J189 Pneumonia, unspecified organism: Secondary | ICD-10-CM

## 2021-01-12 DIAGNOSIS — Z87891 Personal history of nicotine dependence: Secondary | ICD-10-CM | POA: Diagnosis not present

## 2021-01-12 DIAGNOSIS — I2609 Other pulmonary embolism with acute cor pulmonale: Secondary | ICD-10-CM

## 2021-01-12 DIAGNOSIS — Z7901 Long term (current) use of anticoagulants: Secondary | ICD-10-CM | POA: Diagnosis not present

## 2021-01-12 DIAGNOSIS — I2510 Atherosclerotic heart disease of native coronary artery without angina pectoris: Secondary | ICD-10-CM | POA: Diagnosis present

## 2021-01-12 DIAGNOSIS — J439 Emphysema, unspecified: Secondary | ICD-10-CM | POA: Diagnosis present

## 2021-01-12 DIAGNOSIS — I110 Hypertensive heart disease with heart failure: Secondary | ICD-10-CM | POA: Diagnosis present

## 2021-01-12 DIAGNOSIS — I5041 Acute combined systolic (congestive) and diastolic (congestive) heart failure: Secondary | ICD-10-CM | POA: Diagnosis not present

## 2021-01-12 DIAGNOSIS — M109 Gout, unspecified: Secondary | ICD-10-CM | POA: Diagnosis present

## 2021-01-12 DIAGNOSIS — Z789 Other specified health status: Secondary | ICD-10-CM

## 2021-01-12 DIAGNOSIS — N179 Acute kidney failure, unspecified: Secondary | ICD-10-CM | POA: Diagnosis present

## 2021-01-12 DIAGNOSIS — Z79899 Other long term (current) drug therapy: Secondary | ICD-10-CM | POA: Diagnosis not present

## 2021-01-12 DIAGNOSIS — R310 Gross hematuria: Secondary | ICD-10-CM | POA: Diagnosis present

## 2021-01-12 DIAGNOSIS — G609 Hereditary and idiopathic neuropathy, unspecified: Secondary | ICD-10-CM | POA: Diagnosis present

## 2021-01-12 DIAGNOSIS — K219 Gastro-esophageal reflux disease without esophagitis: Secondary | ICD-10-CM | POA: Diagnosis present

## 2021-01-12 DIAGNOSIS — J9621 Acute and chronic respiratory failure with hypoxia: Secondary | ICD-10-CM | POA: Diagnosis present

## 2021-01-12 DIAGNOSIS — Z7984 Long term (current) use of oral hypoglycemic drugs: Secondary | ICD-10-CM | POA: Diagnosis not present

## 2021-01-12 HISTORY — DX: Acute on chronic combined systolic (congestive) and diastolic (congestive) heart failure: I50.43

## 2021-01-12 LAB — I-STAT ARTERIAL BLOOD GAS, ED
Acid-base deficit: 2 mmol/L (ref 0.0–2.0)
Bicarbonate: 21.3 mmol/L (ref 20.0–28.0)
Calcium, Ion: 1.17 mmol/L (ref 1.15–1.40)
HCT: 37 % — ABNORMAL LOW (ref 39.0–52.0)
Hemoglobin: 12.6 g/dL — ABNORMAL LOW (ref 13.0–17.0)
O2 Saturation: 93 %
Patient temperature: 98
Potassium: 3.5 mmol/L (ref 3.5–5.1)
Sodium: 138 mmol/L (ref 135–145)
TCO2: 22 mmol/L (ref 22–32)
pCO2 arterial: 30.4 mmHg — ABNORMAL LOW (ref 32.0–48.0)
pH, Arterial: 7.452 — ABNORMAL HIGH (ref 7.350–7.450)
pO2, Arterial: 63 mmHg — ABNORMAL LOW (ref 83.0–108.0)

## 2021-01-12 LAB — ECHOCARDIOGRAM LIMITED
Calc EF: 29.8 %
Height: 72 in
P 1/2 time: 501 msec
S' Lateral: 4.4 cm
Single Plane A2C EF: 24.2 %
Single Plane A4C EF: 39 %
Weight: 2892.44 oz

## 2021-01-12 LAB — GLUCOSE, CAPILLARY: Glucose-Capillary: 130 mg/dL — ABNORMAL HIGH (ref 70–99)

## 2021-01-12 LAB — PROCALCITONIN: Procalcitonin: <0.1 ng/mL

## 2021-01-12 LAB — CBG MONITORING, ED
Glucose-Capillary: 129 mg/dL — ABNORMAL HIGH (ref 70–99)
Glucose-Capillary: 141 mg/dL — ABNORMAL HIGH (ref 70–99)
Glucose-Capillary: 94 mg/dL (ref 70–99)

## 2021-01-12 LAB — PHOSPHORUS: Phosphorus: 2.8 mg/dL (ref 2.5–4.6)

## 2021-01-12 LAB — LACTIC ACID, PLASMA: Lactic Acid, Venous: 1.7 mmol/L (ref 0.5–1.9)

## 2021-01-12 LAB — MAGNESIUM: Magnesium: 2 mg/dL (ref 1.7–2.4)

## 2021-01-12 MED ORDER — INSULIN ASPART 100 UNIT/ML ~~LOC~~ SOLN: 0.0000 [IU] | Freq: Every day | SUBCUTANEOUS | Status: DC

## 2021-01-12 MED ORDER — FUROSEMIDE 10 MG/ML IJ SOLN
40.0000 mg | Freq: Once | INTRAMUSCULAR | Status: AC
  Administered 2021-01-12: 40 mg via INTRAVENOUS
  Filled 2021-01-12: qty 4

## 2021-01-12 MED ORDER — THIAMINE HCL 100 MG PO TABS
100.0000 mg | ORAL_TABLET | Freq: Every day | ORAL | Status: DC
Start: 2021-01-12 — End: 2021-01-18
  Administered 2021-01-12 – 2021-01-18 (×7): 100 mg via ORAL
  Filled 2021-01-12 (×7): qty 1

## 2021-01-12 MED ORDER — PANTOPRAZOLE SODIUM 40 MG PO TBEC
40.0000 mg | DELAYED_RELEASE_TABLET | Freq: Every day | ORAL | Status: DC
  Administered 2021-01-12 – 2021-01-18 (×7): 40 mg via ORAL
  Filled 2021-01-12 (×7): qty 1

## 2021-01-12 MED ORDER — MEMANTINE HCL-DONEPEZIL HCL ER 28-10 MG PO CP24: 1.0000 | ORAL_CAPSULE | Freq: Every day | ORAL | Status: DC

## 2021-01-12 MED ORDER — DONEPEZIL HCL 5 MG PO TABS
10.0000 mg | ORAL_TABLET | Freq: Every day | ORAL | Status: DC
  Administered 2021-01-12 – 2021-01-17 (×6): 10 mg via ORAL
  Filled 2021-01-12 (×6): qty 2

## 2021-01-12 MED ORDER — LOSARTAN POTASSIUM 50 MG PO TABS
100.0000 mg | ORAL_TABLET | Freq: Every day | ORAL | Status: DC
  Administered 2021-01-12 – 2021-01-18 (×6): 100 mg via ORAL
  Filled 2021-01-12 (×7): qty 2

## 2021-01-12 MED ORDER — VANCOMYCIN HCL 1750 MG/350ML IV SOLN
1750.0000 mg | Freq: Once | INTRAVENOUS | Status: AC
  Administered 2021-01-12: 1750 mg via INTRAVENOUS
  Filled 2021-01-12: qty 350

## 2021-01-12 MED ORDER — VANCOMYCIN HCL 1000 MG/200ML IV SOLN
1000.0000 mg | Freq: Two times a day (BID) | INTRAVENOUS | Status: DC
  Administered 2021-01-13 – 2021-01-14 (×3): 1000 mg via INTRAVENOUS
  Filled 2021-01-12 (×5): qty 200

## 2021-01-12 MED ORDER — SODIUM CHLORIDE 0.9% FLUSH
3.0000 mL | Freq: Two times a day (BID) | INTRAVENOUS | Status: DC
  Administered 2021-01-12 – 2021-01-18 (×14): 3 mL via INTRAVENOUS

## 2021-01-12 MED ORDER — NITROGLYCERIN 0.4 MG SL SUBL
SUBLINGUAL_TABLET | SUBLINGUAL | Status: AC
  Administered 2021-01-12: 0.4 mg via SUBLINGUAL
  Filled 2021-01-12: qty 1

## 2021-01-12 MED ORDER — LABETALOL HCL 5 MG/ML IV SOLN: 10.0000 mg | Freq: Once | INTRAVENOUS | Status: DC

## 2021-01-12 MED ORDER — ATORVASTATIN CALCIUM 40 MG PO TABS
40.0000 mg | ORAL_TABLET | Freq: Every day | ORAL | Status: DC
  Administered 2021-01-12 – 2021-01-17 (×6): 40 mg via ORAL
  Filled 2021-01-12 (×6): qty 1

## 2021-01-12 MED ORDER — MEMANTINE HCL ER 28 MG PO CP24
28.0000 mg | ORAL_CAPSULE | Freq: Every day | ORAL | Status: DC
  Administered 2021-01-12 – 2021-01-17 (×6): 28 mg via ORAL
  Filled 2021-01-12 (×7): qty 1

## 2021-01-12 MED ORDER — LORAZEPAM 2 MG/ML IJ SOLN: 1.0000 mg | INTRAMUSCULAR | Status: AC | PRN

## 2021-01-12 MED ORDER — BENZONATATE 100 MG PO CAPS
100.0000 mg | ORAL_CAPSULE | Freq: Three times a day (TID) | ORAL | Status: DC | PRN
  Administered 2021-01-12: 100 mg via ORAL
  Filled 2021-01-12: qty 1

## 2021-01-12 MED ORDER — LORAZEPAM 1 MG PO TABS
1.0000 mg | ORAL_TABLET | ORAL | Status: AC | PRN
  Administered 2021-01-13: 1 mg via ORAL
  Filled 2021-01-12: qty 1

## 2021-01-12 MED ORDER — ALBUTEROL SULFATE HFA 108 (90 BASE) MCG/ACT IN AERS
2.0000 | INHALATION_SPRAY | Freq: Four times a day (QID) | RESPIRATORY_TRACT | Status: DC | PRN
  Filled 2021-01-12: qty 6.7

## 2021-01-12 MED ORDER — PIPERACILLIN-TAZOBACTAM 3.375 G IVPB 30 MIN
3.3750 g | Freq: Once | INTRAVENOUS | Status: AC
  Administered 2021-01-12: 3.375 g via INTRAVENOUS
  Filled 2021-01-12: qty 50

## 2021-01-12 MED ORDER — METOPROLOL SUCCINATE ER 50 MG PO TB24
50.0000 mg | ORAL_TABLET | Freq: Every day | ORAL | Status: DC
  Administered 2021-01-12 – 2021-01-18 (×6): 50 mg via ORAL
  Filled 2021-01-12 (×3): qty 1
  Filled 2021-01-12: qty 2
  Filled 2021-01-12 (×3): qty 1

## 2021-01-12 MED ORDER — FUROSEMIDE 10 MG/ML IJ SOLN
40.0000 mg | Freq: Two times a day (BID) | INTRAMUSCULAR | Status: DC
  Administered 2021-01-12 – 2021-01-13 (×2): 40 mg via INTRAVENOUS
  Filled 2021-01-12 (×3): qty 4

## 2021-01-12 MED ORDER — NITROGLYCERIN 0.4 MG SL SUBL: 0.4000 mg | SUBLINGUAL_TABLET | SUBLINGUAL | Status: DC | PRN

## 2021-01-12 MED ORDER — ONDANSETRON HCL 4 MG/2ML IJ SOLN: 4.0000 mg | Freq: Four times a day (QID) | INTRAMUSCULAR | Status: DC | PRN

## 2021-01-12 MED ORDER — ACETAMINOPHEN 325 MG PO TABS
650.0000 mg | ORAL_TABLET | ORAL | Status: DC | PRN
  Administered 2021-01-16 – 2021-01-17 (×2): 650 mg via ORAL
  Filled 2021-01-12 (×2): qty 2

## 2021-01-12 MED ORDER — ADULT MULTIVITAMIN W/MINERALS CH
1.0000 | ORAL_TABLET | Freq: Every day | ORAL | Status: DC
  Administered 2021-01-12 – 2021-01-18 (×7): 1 via ORAL
  Filled 2021-01-12 (×7): qty 1

## 2021-01-12 MED ORDER — FOLIC ACID 1 MG PO TABS: 1.0000 mg | ORAL_TABLET | Freq: Every day | ORAL | Status: DC

## 2021-01-12 MED ORDER — ALLOPURINOL 300 MG PO TABS
300.0000 mg | ORAL_TABLET | Freq: Every day | ORAL | Status: DC
  Administered 2021-01-12 – 2021-01-18 (×7): 300 mg via ORAL
  Filled 2021-01-12 (×5): qty 1
  Filled 2021-01-12: qty 3
  Filled 2021-01-12: qty 1

## 2021-01-12 MED ORDER — INSULIN ASPART 100 UNIT/ML ~~LOC~~ SOLN
0.0000 [IU] | Freq: Three times a day (TID) | SUBCUTANEOUS | Status: DC
  Administered 2021-01-12 – 2021-01-13 (×3): 2 [IU] via SUBCUTANEOUS
  Administered 2021-01-13: 5 [IU] via SUBCUTANEOUS
  Administered 2021-01-14: 3 [IU] via SUBCUTANEOUS
  Administered 2021-01-14 – 2021-01-18 (×6): 2 [IU] via SUBCUTANEOUS

## 2021-01-12 MED ORDER — SODIUM CHLORIDE 0.9% FLUSH: 3.0000 mL | INTRAVENOUS | Status: DC | PRN

## 2021-01-12 MED ORDER — PIPERACILLIN-TAZOBACTAM 3.375 G IVPB
3.3750 g | Freq: Three times a day (TID) | INTRAVENOUS | Status: DC
  Administered 2021-01-12 – 2021-01-14 (×5): 3.375 g via INTRAVENOUS
  Filled 2021-01-12 (×6): qty 50

## 2021-01-12 MED ORDER — SODIUM CHLORIDE 0.9 % IV SOLN: 250.0000 mL | INTRAVENOUS | Status: DC | PRN

## 2021-01-12 MED ORDER — FOLIC ACID 1 MG PO TABS
1.0000 mg | ORAL_TABLET | Freq: Every day | ORAL | Status: DC
Start: 2021-01-12 — End: 2021-01-18
  Administered 2021-01-12 – 2021-01-18 (×7): 1 mg via ORAL
  Filled 2021-01-12 (×7): qty 1

## 2021-01-12 NOTE — ED Notes (Signed)
Checked patient blood sugar it was 129 notified RN Paige of blood sugar got patient up to the bedside toilet patient is resting with call bell in reach

## 2021-01-12 NOTE — Progress Notes (Addendum)
Paged by RN:  Pt with increased WOB, satting okay until he moves, no CP.  S. Tach to 110s, BP running high (160/120 on one arm, 178/99 on the other arm).  300cc uop thus far from lasix.  1) trying SL NTG now (I suspect hes not tolerating these BPs with his reduced EF). 2) stat ABG 3) RRT to eval and try BIPAP. 4) ordering repeat CXR now 5) repeat EKG done, no change 6) changing bed request to progressive

## 2021-01-12 NOTE — Progress Notes (Signed)
  Echocardiogram 2D Echocardiogram has been performed.  Augustine Radar 01/12/2021, 3:02 PM

## 2021-01-12 NOTE — ED Notes (Signed)
Dr. Dalene Seltzer came to bedside to speak with the patient. Pt given a cup of water.

## 2021-01-12 NOTE — ED Notes (Signed)
Attempted to call report

## 2021-01-12 NOTE — Progress Notes (Signed)
Pharmacy Antibiotic Note  Gary Mcneil is a 80 y.o. male admitted on 01/11/2021 with pneumonia.  Pharmacy has been consulted for Vancomycin and Zosyn dosing. WBC 10.8, afebrile, Scr 0.93, LA 1.7.  Plan: Vancomycin 1750 mg IV once, then 1000 mg IV Q12 hrs (AUC 521, Scr 0.93, Vd 0.72) Zosyn 3.375 mg IV Q8 hrs (4-hr infusion) Monitor renal function, cultures/sensitivities, clinical progression F/u MRSA PCR   Height: 6' (182.9 cm) Weight: 82 kg (180 lb 12.4 oz) IBW/kg (Calculated) : 77.6  Temp (24hrs), Avg:98.1 F (36.7 C), Min:97.6 F (36.4 C), Max:98.9 F (37.2 C)  Recent Labs  Lab 01/11/21 2020 01/11/21 2352  WBC 10.8*  --   CREATININE 0.93  --   LATICACIDVEN  --  1.7    Estimated Creatinine Clearance: 73 mL/min (by C-G formula based on SCr of 0.93 mg/dL).    Allergies  Allergen Reactions  . Penicillins Rash    Antimicrobials this admission: Vancomycin 2/23 >>  Zosyn 2/23 >>   Dose adjustments this admission: N/A  Microbiology results: 2/23 BCx: pend 2/23 MRSA PCR:   Tama Headings, PharmD, BCPS PGY2 Cardiology Pharmacy Resident Phone: [phone] 01/12/2021  2:56 PM  Please check [email or web address] for unit-specific pharmacy phone numbers.

## 2021-01-12 NOTE — H&P (Signed)
History and Physical    Gary Mcneil Gary Mcneil DOB: December 07, 1940 DOA: 01/11/2021  PCP: Harvest Forest, MD  Patient coming from: Home  I have personally briefly reviewed patient's old medical records in [facility]  Chief Complaint: Hypoxia  HPI: Gary Mcneil is a 80 y.o. 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  Pt recently admitted for acute hypoxic resp failure due to COVID-19 PNA and pulmonary embolism on 2/4.  Also noted to have acute CHF during that admit treated with dose of lasix.  Pt discharged home on O2 with ambulation on 2/10.  Pt lives alone with family checking in on him.  They checked on him at noon and he was fine.  They checked back in on him at 4pm they found him down on the floor and he appeared to be unconscious.  EMS called.  Pt had O2 sats in the 60s on their arrival per report.  Pt reports he has had continued SOB, DOE, fatigue since the time of discharge.  No CP, fever, vomiting, diarrhea.  When he fell he hit back of head, initially had severe headache.  Not sure how long on floor before family found him (presumably <4h from above history).  He reports he doesn't think he had LOC.   ED Course: CT head and neck is neg. CTA chest: 1) poor contrast timing 2) lungs show increasingly confluent opacities compared to earlier this month.  Satting upper 90s on 4L via [place].  WBC 10.8k  EDP was concerned for superimposed bacterial PNA so put pt on ABx.  BNP 697, trop 45 and 41.  Procalcitonin has since come back <0.10.  BNP earlier this month: 553 on 2/4 and 347 on 2/8.   Review of Systems: As per HPI, otherwise all review of systems negative.  Past Medical History:  Diagnosis Date  . "walking corpse" syndrome   . Acute kidney failure, unspecified (HCC)   . Acute kidney failure, unspecified (HCC)   . Acute on chronic combined systolic and diastolic CHF  (congestive heart failure) (HCC) 01/12/2021  . Anemia, unspecified   . Arthritis   . Dementia   . Dementia (HCC) 04/24/2012  . Disorder of bone and cartilage, unspecified   . Dizziness and giddiness   . GERD (gastroesophageal reflux disease)   . Gout   . Gout, unspecified   . Hypertension   . Hypopotassemia   . Hypotension, unspecified   . Hypotension, unspecified   . Memory loss   . Memory loss   . Muscle weakness (generalized)   . Myalgia and myositis, unspecified   . Neuromuscular disorder (HCC)   . Other and unspecified hyperlipidemia   . Screening for ischemic heart disease   . Tobacco use disorder   . Type II or unspecified type diabetes mellitus without mention of complication, uncontrolled   . Type II or unspecified type diabetes mellitus without mention of complication, uncontrolled   . Unspecified hereditary and idiopathic peripheral neuropathy   . Unspecified vitamin D deficiency     Past Surgical History:  Procedure Laterality Date  . INGUINAL HERNIA REPAIR    . VIDEO ASSISTED THORACOSCOPY (VATS)/EMPYEMA Right 04/30/2015   Procedure: VIDEO ASSISTED THORACOSCOPY (VATS)/DRAIN EMPYEMA;  Surgeon: [name], MD;  Location: [facility] OR;  Service: Thoracic;  Laterality: Right;  [name] VIDEO BRONCHOSCOPY N/A 04/30/2015   Procedure: VIDEO BRONCHOSCOPY;  Surgeon: Kathlee Nations Trigt,  MD;  Location: [facility] OR;  Service: Thoracic;  Laterality: N/A;     reports that he quit smoking about 51 years ago. He has never used smokeless tobacco. He reports current alcohol use. He reports previous drug use. Drug: Marijuana.  Allergies  Allergen Reactions  . Penicillins Rash    Family History  Problem Relation Age of Onset  . Diabetes Mother   . Hypertension Mother   . Diabetes Father   . Hypertension Father   . Kidney disease Father   . Diabetes Sister   . Kidney disease Brother      Prior to Admission medications   Medication Sig Start Date End Date Taking? Authorizing Provider   albuterol (VENTOLIN HFA) 108 (90 Base) MCG/ACT inhaler Inhale 2 puffs into the lungs every 6 (six) hours. Patient taking differently: Inhale 2 puffs into the lungs in the morning and at bedtime. 12/30/20  Yes Ghimire, Werner Lean, MD  allopurinol (ZYLOPRIM) 300 MG tablet Take 300 mg by mouth daily.   Yes [provider]  APIXABAN Everlene Balls) VTE STARTER PACK (10MG  AND 5MG ) Take as directed on package: start with two-5mg  tablets twice daily for 7 days. On day 8, switch to one-5mg  tablet twice daily. 12/30/20  Yes Ghimire, , MD  atorvastatin (LIPITOR) 40 MG tablet Take 1 tablet (40 mg total) by mouth at bedtime. 12/30/20  Yes Ghimire, Werner Lean, MD  benzonatate (TESSALON PERLES) 100 MG capsule Take 1 capsule (100 mg total) by mouth 3 (three) times daily as needed for cough. 12/30/20 12/30/21 Yes Ghimire, 02/27/21, MD  folic acid (FOLVITE) 1 MG tablet Take 1 tablet (1 mg total) by mouth daily. 12/31/20  Yes Ghimire, Werner Lean, MD  linagliptin (TRADJENTA) 5 MG TABS tablet Take 5 mg by mouth daily.   Yes [provider]  losartan (COZAAR) 100 MG tablet TAKE 1 TABLET(100 MG) BY MOUTH DAILY Patient taking differently: Take 100 mg by mouth daily. 06/10/19  Yes Werner Lean, NP  Memantine HCl-Donepezil HCl (NAMZARIC) 28-10 MG CP24 Take 1 capsule by mouth daily. 06/10/19  Yes [name], NP  metFORMIN (GLUCOPHAGE) 500 MG tablet TAKE 1 TABLET BY MOUTH EVERY DAY WITH BREAKFAST Patient taking differently: Take 500 mg by mouth daily with breakfast. TAKE 1 TABLET BY MOUTH EVERY DAY WITH BREAKFAST 06/10/19  Yes [name], NP  metoprolol succinate (TOPROL-XL) 50 MG 24 hr tablet Take 1 tablet (50 mg total) by mouth daily. Take with or immediately following a meal. 12/31/20  Yes Ghimire, [name], MD  Multiple Vitamin (MULTIVITAMIN WITH MINERALS) TABS tablet Take 1 tablet by mouth daily.   Yes [provider]  pantoprazole (PROTONIX) 40 MG tablet Take 1 tablet (40 mg  total) by mouth daily at 12 noon. 12/30/20  Yes Ghimire, Werner Lean, MD  potassium chloride SA (KLOR-CON) 20 MEQ tablet Take 3 tablets (60 mEq total) by mouth daily. 11/20/19  Yes Werner Lean, NP  thiamine 100 MG tablet Take 1 tablet (100 mg total) by mouth daily. 12/31/20  Yes [name], MD    Physical Exam: Vitals:   01/12/21 0045 01/12/21 0130 01/12/21 0200 01/12/21 0230  BP: (!) 144/81 135/88 (!) 150/93 133/86  Pulse: (!) 107 91 87 (!) 38  Resp: (!) 27 (!) 28 (!) 27 19  Temp:      TempSrc:      SpO2: 91% 96% 96% 95%  Weight:      Height:  Constitutional: NAD, calm, comfortable Eyes: PERRL, lids and conjunctivae normal ENMT: Mucous membranes are moist. Posterior pharynx clear of any exudate or lesions.Normal dentition.  Neck: normal, supple, no masses, no thyromegaly Respiratory: clear to auscultation bilaterally, no wheezing, no crackles. Normal respiratory effort. No accessory muscle use.  Cardiovascular: Regular rate and rhythm, no murmurs / rubs / gallops. No extremity edema. 2+ pedal pulses. No carotid bruits.  Abdomen: no tenderness, no masses palpated. No hepatosplenomegaly. Bowel sounds positive.  Musculoskeletal: no clubbing / cyanosis. No joint deformity upper and lower extremities. Good ROM, no contractures. Normal muscle tone.  Skin: no rashes, lesions, ulcers. No induration Neurologic: CN 2-12 grossly intact. Sensation intact, DTR normal. Strength 5/5 in all 4.  Psychiatric: Normal judgment and insight. Alert and oriented x 3. Normal mood.    Labs on Admission: I have personally reviewed following labs and imaging studies  CBC: Recent Labs  Lab 01/11/21 2020  WBC 10.8*  NEUTROABS 8.5*  HGB 13.5  HCT 37.0*  MCV 85.8  PLT 137*   Basic Metabolic Panel: Recent Labs  Lab 01/11/21 2020  NA 139  K 4.0  CL 106  CO2 23  GLUCOSE 126*  BUN 18  CREATININE 0.93  CALCIUM 8.5*   GFR: Estimated Creatinine Clearance: 73 mL/min (by C-G  formula based on SCr of 0.93 mg/dL). Liver Function Tests: Recent Labs  Lab 01/11/21 2020  AST 32  ALT 25  ALKPHOS 66  BILITOT 1.1  PROT 5.8*  ALBUMIN 2.7*   No results for input(s): LIPASE, AMYLASE in the last 168 hours. No results for input(s): AMMONIA in the last 168 hours. Coagulation Profile: No results for input(s): INR, PROTIME in the last 168 hours. Cardiac Enzymes: No results for input(s): CKTOTAL, CKMB, CKMBINDEX, TROPONINI in the last 168 hours. BNP (last 3 results) No results for input(s): PROBNP in the last 8760 hours. HbA1C: No results for input(s): HGBA1C in the last 72 hours. CBG: No results for input(s): GLUCAP in the last 168 hours. Lipid Profile: No results for input(s): CHOL, HDL, LDLCALC, TRIG, CHOLHDL, LDLDIRECT in the last 72 hours. Thyroid Function Tests: No results for input(s): TSH, T4TOTAL, FREET4, T3FREE, THYROIDAB in the last 72 hours. Anemia Panel: No results for input(s): VITAMINB12, FOLATE, FERRITIN, TIBC, IRON, RETICCTPCT in the last 72 hours. Urine analysis:    Component Value Date/Time   COLORURINE AMBER (A) 04/29/2015 1030   APPEARANCEUR Clear 05/05/2016 1554   LABSPEC 1.024 04/29/2015 1030   PHURINE 6.0 04/29/2015 1030   GLUCOSEU 1+ (A) 05/05/2016 1554   HGBUR NEGATIVE 04/29/2015 1030   BILIRUBINUR Negative 05/05/2016 1554   KETONESUR NEGATIVE 04/29/2015 1030   PROTEINUR Trace 05/05/2016 1554   PROTEINUR NEGATIVE 04/29/2015 1030   UROBILINOGEN 1.0 04/29/2015 1030   NITRITE Negative 05/05/2016 1554   NITRITE NEGATIVE 04/29/2015 1030   LEUKOCYTESUR Negative 05/05/2016 1554    Radiological Exams on Admission: CT Head Wo Contrast  Result Date: 01/11/2021 CLINICAL DATA:  Status post fall.  Recent history of COVID positive. EXAM: CT HEAD WITHOUT CONTRAST TECHNIQUE: Contiguous axial images were obtained from the base of the skull through the vertex without intravenous contrast. COMPARISON:  December 30, 2014 FINDINGS: Brain: There is  mild to moderate severity cerebral atrophy with widening of the extra-axial spaces and stable ventricular dilatation. There are areas of decreased attenuation within the white matter tracts of the supratentorial brain, consistent with microvascular disease changes. Vascular: No hyperdense vessel or unexpected calcification. Skull: Negative for an acute fracture. A stable  9 mm x 5 mm benign-appearing sclerotic focus is seen along the vertex on the right (axial CT image 73, CT series number 4). Sinuses/Orbits: No acute finding. Other: None. IMPRESSION: 1. Generalized cerebral atrophy with stable ventriculomegaly. A component of superimposed communicating hydrocephalus cannot be excluded. 2. No acute intracranial abnormality. Electronically Signed   By: Aram Candelahaddeus  Houston M.D.   On: 01/11/2021 20:34   CT Angio Chest PE W and/or Wo Contrast  Result Date: 01/11/2021 CLINICAL DATA:  Weakness and shortness of breath after discharge for COVID 19 EXAM: CT ANGIOGRAPHY CHEST WITH CONTRAST TECHNIQUE: Multidetector CT imaging of the chest was performed using the standard protocol during bolus administration of intravenous contrast. Multiplanar CT image reconstructions and MIPs were obtained to evaluate the vascular anatomy. CONTRAST:  80mL OMNIPAQUE IOHEXOL 350 MG/ML SOLN, 105mL OMNIPAQUE IOHEXOL 350 MG/ML SOLN COMPARISON:  CT 12/24/2020 FINDINGS: Cardiovascular: Initial contrast administration resulting in suboptimal bolus timing significantly limiting detection of pulmonary artery emboli. Repeat imaging was performed with a slightly improved central pulmonary artery attenuation though port technique results in persistently suboptimal distal opacification beyond the left and right main pulmonary arteries with exam further degraded by extensive respiratory motion artifact. No large central pulmonary artery filling defects are identified. A previously seen subsegmental filling defect in the right lower lobe is beyond the  limits of detection on this study. Central pulmonary arteries are notably enlarged though similar to comparison. Normal heart size. No pericardial effusion. Three-vessel coronary artery atherosclerosis. Atherosclerotic plaque within the normal caliber aorta. Suboptimal opacification for a luminal assessment of the aorta. No gross aortic abnormality is discernible. Normal 3 vessel branching of the great vessels. Minimal plaque in the proximal great vessels. Mediastinum/Nodes: No mediastinal fluid or gas. Normal thyroid gland and thoracic inlet. No acute abnormality of the trachea or esophagus. No worrisome mediastinal, hilar or axillary adenopathy. Lungs/Pleura: Diffuse heterogeneous opacities again seen throughout the lungs, with increasingly coalescent consolidative opacity in the regions of previously seen ground-glass compatible with a worsening infectious process. Findings on a background of centrilobular and paraseptal emphysematous change. Diffuse airways thickening and scattered secretions are noted. No pneumothorax. Small left pleural effusion is present. Dependent atelectatic features bilaterally. Upper Abdomen: Stable fluid attenuation cyst in the upper pole left kidney. No acute abnormalities present in the visualized portions of the upper abdomen. Musculoskeletal: Degenerative changes are present in the imaged spine and shoulders. No acute osseous abnormality or suspicious osseous lesion. No worrisome chest wall masses or lesions. Review of the MIP images confirms the above findings. IMPRESSION: 1. Markedly suboptimal pulmonary artery assessment given technical factors as detailed above. No large central filling defects are seen. A previously seen subsegmental filling defect in the right lower lobe is beyond the limits of detection. Chronic pulmonary artery enlargement most compatible with pulmonary artery hypertension. 2. Diffuse heterogeneous opacities again seen throughout the lungs, with increasingly  coalescent consolidative opacity in the regions of previously seen ground-glass compatible with a worsening infectious process. 3. Small left pleural effusion. 4. Three-vessel coronary artery atherosclerosis. 5.  Emphysema (ICD10-J43.9). 6. Aortic Atherosclerosis (ICD10-I70.0) Electronically Signed   By: Kreg ShropshirePrice  DeHay M.D.   On: 01/11/2021 22:49   CT Cervical Spine Wo Contrast  Result Date: 01/11/2021 CLINICAL DATA:  Status post fall. EXAM: CT CERVICAL SPINE WITHOUT CONTRAST TECHNIQUE: Multidetector CT imaging of the cervical spine was performed without intravenous contrast. Multiplanar CT image reconstructions were also generated. COMPARISON:  None. FINDINGS: Alignment: There is approximately 2 mm anterolisthesis of the C2 vertebral body  on C3. Skull base and vertebrae: No acute fracture. Chronic changes are seen along the tip of the dens. Soft tissues and spinal canal: No prevertebral fluid or swelling. No visible canal hematoma. Disc levels: Marked severity endplate sclerosis and anterior osteophyte formation is seen at the levels of C3-C4, C4-C5, C5-C6 and C6-C7. Moderate severity intervertebral disc space narrowing is also seen at the levels of C4-C5 and C5-C6. Marked severity intervertebral disc space narrowing is present at the level of C6-C7. Marked severity bilateral multilevel facet joint hypertrophy is noted. Upper chest: Negative. Other: None. IMPRESSION: 1. Marked severity multilevel degenerative changes, most prominent at the levels of C3-C4, C4-C5, C5-C6 and C6-C7. 2. No acute cervical spine fracture. Electronically Signed   By: Aram Candela M.D.   On: 01/11/2021 20:38   DG Chest Portable 1 View  Result Date: 01/11/2021 CLINICAL DATA:  80 year old male with fall. EXAM: PORTABLE CHEST 1 VIEW COMPARISON:  Chest radiograph dated 12/27/2020. FINDINGS: Background of emphysema. Diffuse interstitial coarsening primarily involving the mid to lower lung field and peripherally. Interval progression  of associated airspace densities since the prior radiograph concerning for worsening pneumonia. Clinical correlation is recommended. No large pleural effusion. No pneumothorax. Stable cardiac silhouette. No acute osseous pathology. IMPRESSION: 1. Findings concerning for worsening pneumonia. Clinical correlation is recommended. 2. Emphysema. Electronically Signed   By: Elgie Collard M.D.   On: 01/11/2021 20:05    EKG: Independently reviewed.  Assessment/Plan Principal Problem:   Acute respiratory failure with hypoxia (HCC) Active Problems:   HTN (hypertension)   Type 2 diabetes mellitus with diabetic neuropathy (HCC)   Dementia without behavioral disturbance (HCC)   Essential hypertension   Pulmonary embolism (HCC)   Acute on chronic combined systolic and diastolic CHF (congestive heart failure) (HCC)   Alcohol use    1. Acute on chronic resp failure with hypoxia - 1. Superimposed bacterial PNA felt less likely based on procalcitonin, lack of fever, etc. 2. More suspicious that he may be having decompensated CHF 3. CHF pathway 4. Giving test dose of  IV lasix x1 to see how he responds 5. Strict intake and output 6. Tele monitor 7. Repeat 2d echo 2. HTN - 1. Cont home BP meds 3. PE - 1. Cont eliquis 4. EtOH use -  1. CIWA 5. DM2 - 1. Hold home PO meds 2. Mod scale SSI AC/HS 6. Dementia - 1. Cont memantine - donepezil  DVT prophylaxis: Eliquis Code Status: Full Family Communication: No family in room Disposition Plan: Home after admit Consults called: None Admission status: Place in 86    GARDNER, JARED M. DO [facility]  How to contact the [facility] Attending or Consulting provider 7A - 7P or covering provider during after hours 7P -7A, for this patient?  1. Check the care team in [facility] and look for a) attending/consulting [facility] provider listed and b) the [facility] team listed 2. Log into [email or web address]  Amion Physician Scheduling and messaging for groups and whole  hospitals  On call and physician scheduling software for group practices, residents, hospitalists and other medical providers for call, clinic, rotation and shift schedules. On[employer] is a hospital-wide system for scheduling doctors and paging doctors on call. EasyPlot is for scientific plotting and data analysis.  [email or web address]  and use [place]'s universal password to access. If you do not have the password, please contact the hospital operator.  3. Locate the [facility] provider you are looking for under [facility] and page to a number that you  can be directly reached. 4. If you still have difficulty reaching the provider, please page the [facility] (Director on Call) for the Hospitalists listed on amion for assistance.  01/12/2021, 3:02 AM

## 2021-01-12 NOTE — ED Notes (Signed)
Pt cleaned of bowel incontinence. Pt could not tolerate for nurse to change bed linens. Pt became SOB with movement. IV team came to see pt and pt advised that he was SOB and requested for her to come back at a later time.

## 2021-01-12 NOTE — Progress Notes (Signed)
Pts order for BIPAP is PRN. Pts respiratory status is stable on  4Lpm at this time w/no distress noted. RT will continue to monitor.

## 2021-01-12 NOTE — Progress Notes (Signed)
PROGRESS NOTE    Gary Mcneil  DJM:[id number] DOB: [birth year] DOA: 01/11/2021 PCP: [place], MD   Brief Narrative:  Gary Mcneil is a 80 y.o. 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 Pt recently admitted for acute hypoxic resp failure due to COVID-19 PNA and pulmonary embolism on 2/4.  Also noted to have acute CHF during that admit treated with dose of lasix.  Pt discharged home on O2 with ambulation on 2/10. Pt lives alone with family checking in on him.  They checked on him at noon and he was fine.  They checked back in on him at 4pm they found him down on the floor and he appeared to be unconscious with hypoxia into the 60s on their arrival per report. In ED: CT head and neck is neg. CTA chest: Poor contrast timing, lungs show increasingly confluent opacities compared to earlier this month.  Assessment & Plan: Principal Problem:   Acute respiratory failure with hypoxia (HCC) Active Problems:   HTN (hypertension)   Type 2 diabetes mellitus with diabetic neuropathy (HCC)   Dementia without behavioral disturbance (HCC)   Essential hypertension   Pulmonary embolism (HCC)   Acute on chronic combined systolic and diastolic CHF (congestive heart failure) (HCC)   Alcohol use  Acute on chronic resp failure with hypoxia Rule out aspiration versus HCAP; rule out heart failure exacerbation Negative procalcitonin, continue antibiotics given symptoms concern for aspiration Vancomycin/Zosyn x5 days, will de-escalate appropriately, MRSA nares pending Continue diuretics in the setting of questionable heart failure exacerbation Hypoxia resolving quickly with supportive care and diuretics Repeat echo pending  HTN, essential Continue home medications  History of PE, POA Continue eliquis  Chronic alcohol use    CIWA protocol ongoing, not scoring  DM2 Hold home PO meds Mod scale SSI  AC/HS  Dementia Continue home memantine - donepezil  DVT prophylaxis: Eliquis Code Status: Full Family Communication: None available  Status is: Inpatient  Dispo: The patient is from: Home              Anticipated d/c is to: To be determined              Anticipated d/c date is: 24 to 48 hours              Patient currently not medically stable for discharge  Consultants:   None  Procedures:   None  Antimicrobials:  Zosyn, vancomycin  Subjective: No acute issues or events overnight, patient appears to be back to baseline mental status, denies nausea vomiting diarrhea constipation headache fevers or chills.  Objective: Vitals:   01/12/21 0630 01/12/21 0645 01/12/21 0650 01/12/21 0655  BP: (!) 184/97 (!) 161/120 (!) 178/99   Pulse: (!) 110 (!) 108 (!) 101   Resp: (!) 36 (!) 30 (!) 27   Temp:    98 F (36.7 C)  TempSrc:    Oral  SpO2: 99% 100% 94%   Weight:      Height:        Intake/Output Summary (Last 24 hours) at 01/12/2021 0658 Last data filed at 01/12/2021 0654 Gross per 24 hour  Intake --  Output 300 ml  Net -300 ml   Filed Weights   01/11/21 1846  Weight: 82 kg    Examination:  General exam: Appears calm and comfortable  Respiratory system: Clear to auscultation. Respiratory effort normal. Cardiovascular system: S1 &  S2 heard, RRR. No JVD, murmurs, rubs, gallops or clicks. No pedal edema. Gastrointestinal system: Abdomen is nondistended, soft and nontender. No organomegaly or masses felt. Normal bowel sounds heard. Central nervous system: Alert and oriented. No focal neurological deficits. Extremities: Symmetric 5 x 5 power. Skin: No rashes, lesions or ulcers Psychiatry: Judgement and insight appear normal. Mood & affect appropriate.     Data Reviewed: I have personally reviewed following labs and imaging studies  CBC: Recent Labs  Lab 01/11/21 2020  WBC 10.8*  NEUTROABS 8.5*  HGB 13.5  HCT 37.0*  MCV 85.8  PLT 137*   Basic  Metabolic Panel: Recent Labs  Lab 01/11/21 2020 01/12/21 0300  NA 139  --   K 4.0  --   CL 106  --   CO2 23  --   GLUCOSE 126*  --   BUN 18  --   CREATININE 0.93  --   CALCIUM 8.5*  --   MG  --  2.0  PHOS  --  2.8   GFR: Estimated Creatinine Clearance: 73 mL/min (by C-G formula based on SCr of 0.93 mg/dL). Liver Function Tests: Recent Labs  Lab 01/11/21 2020  AST 32  ALT 25  ALKPHOS 66  BILITOT 1.1  PROT 5.8*  ALBUMIN 2.7*   No results for input(s): LIPASE, AMYLASE in the last 168 hours. No results for input(s): AMMONIA in the last 168 hours. Coagulation Profile: No results for input(s): INR, PROTIME in the last 168 hours. Cardiac Enzymes: No results for input(s): CKTOTAL, CKMB, CKMBINDEX, TROPONINI in the last 168 hours. BNP (last 3 results) No results for input(s): PROBNP in the last 8760 hours. HbA1C: No results for input(s): HGBA1C in the last 72 hours. CBG: No results for input(s): GLUCAP in the last 168 hours. Lipid Profile: No results for input(s): CHOL, HDL, LDLCALC, TRIG, CHOLHDL, LDLDIRECT in the last 72 hours. Thyroid Function Tests: No results for input(s): TSH, T4TOTAL, FREET4, T3FREE, THYROIDAB in the last 72 hours. Anemia Panel: No results for input(s): VITAMINB12, FOLATE, FERRITIN, TIBC, IRON, RETICCTPCT in the last 72 hours. Sepsis Labs: Recent Labs  Lab 01/11/21 2052 01/11/21 2352  PROCALCITON <0.10  --   LATICACIDVEN  --  1.7    No results found for this or any previous visit (from the past 240 hour(s)).   Radiology Studies: CT Head Wo Contrast  Result Date: 01/11/2021 CLINICAL DATA:  Status post fall.  Recent history of COVID positive. EXAM: CT HEAD WITHOUT CONTRAST TECHNIQUE: Contiguous axial images were obtained from the base of the skull through the vertex without intravenous contrast. COMPARISON:  December 30, 2014 FINDINGS: Brain: There is mild to moderate severity cerebral atrophy with widening of the extra-axial spaces and stable  ventricular dilatation. There are areas of decreased attenuation within the white matter tracts of the supratentorial brain, consistent with microvascular disease changes. Vascular: No hyperdense vessel or unexpected calcification. Skull: Negative for an acute fracture. A stable 9 mm x 5 mm benign-appearing sclerotic focus is seen along the vertex on the right (axial CT image 73, CT series number 4). Sinuses/Orbits: No acute finding. Other: None. IMPRESSION: 1. Generalized cerebral atrophy with stable ventriculomegaly. A component of superimposed communicating hydrocephalus cannot be excluded. 2. No acute intracranial abnormality. Electronically Signed   By: Aram Candela M.D.   On: 01/11/2021 20:34   CT Angio Chest PE W and/or Wo Contrast  Result Date: 01/11/2021 CLINICAL DATA:  Weakness and shortness of breath after discharge for COVID 19 EXAM:  CT ANGIOGRAPHY CHEST WITH CONTRAST TECHNIQUE: Multidetector CT imaging of the chest was performed using the standard protocol during bolus administration of intravenous contrast. Multiplanar CT image reconstructions and MIPs were obtained to evaluate the vascular anatomy. CONTRAST:  42mL OMNIPAQUE IOHEXOL 350 MG/ML SOLN, OMNIPAQUE IOHEXOL 350 MG/ML SOLN COMPARISON:  CT 12/24/2020 FINDINGS: Cardiovascular: Initial contrast administration resulting in suboptimal bolus timing significantly limiting detection of pulmonary artery emboli. Repeat imaging was performed with a slightly improved central pulmonary artery attenuation though port technique results in persistently suboptimal distal opacification beyond the left and right main pulmonary arteries with exam further degraded by extensive respiratory motion artifact. No large central pulmonary artery filling defects are identified. A previously seen subsegmental filling defect in the right lower lobe is beyond the limits of detection on this study. Central pulmonary arteries are notably enlarged though similar  to comparison. Normal heart size. No pericardial effusion. Three-vessel coronary artery atherosclerosis. Atherosclerotic plaque within the normal caliber aorta. Suboptimal opacification for a luminal assessment of the aorta. No gross aortic abnormality is discernible. Normal 3 vessel branching of the great vessels. Minimal plaque in the proximal great vessels. Mediastinum/Nodes: No mediastinal fluid or gas. Normal thyroid gland and thoracic inlet. No acute abnormality of the trachea or esophagus. No worrisome mediastinal, hilar or axillary adenopathy. Lungs/Pleura: Diffuse heterogeneous opacities again seen throughout the lungs, with increasingly coalescent consolidative opacity in the regions of previously seen ground-glass compatible with a worsening infectious process. Findings on a background of centrilobular and paraseptal emphysematous change. Diffuse airways thickening and scattered secretions are noted. No pneumothorax. Small left pleural effusion is present. Dependent atelectatic features bilaterally. Upper Abdomen: Stable fluid attenuation cyst in the upper pole left kidney. No acute abnormalities present in the visualized portions of the upper abdomen. Musculoskeletal: Degenerative changes are present in the imaged spine and shoulders. No acute osseous abnormality or suspicious osseous lesion. No worrisome chest wall masses or lesions. Review of the MIP images confirms the above findings. IMPRESSION: 1. Markedly suboptimal pulmonary artery assessment given technical factors as detailed above. No large central filling defects are seen. A previously seen subsegmental filling defect in the right lower lobe is beyond the limits of detection. Chronic pulmonary artery enlargement most compatible with pulmonary artery hypertension. 2. Diffuse heterogeneous opacities again seen throughout the lungs, with increasingly coalescent consolidative opacity in the regions of previously seen ground-glass compatible with a  worsening infectious process. 3. Small left pleural effusion. 4. Three-vessel coronary artery atherosclerosis. 5.  Emphysema (ICD10-J43.9). 6. Aortic Atherosclerosis (ICD10-I70.0) Electronically Signed   By: Kreg Shropshire M.D.   On: 01/11/2021 22:49   CT Cervical Spine Wo Contrast  Result Date: 01/11/2021 CLINICAL DATA:  Status post fall. EXAM: CT CERVICAL SPINE WITHOUT CONTRAST TECHNIQUE: Multidetector CT imaging of the cervical spine was performed without intravenous contrast. Multiplanar CT image reconstructions were also generated. COMPARISON:  None. FINDINGS: Alignment: There is approximately 2 mm anterolisthesis of the C2 vertebral body on C3. Skull base and vertebrae: No acute fracture. Chronic changes are seen along the tip of the dens. Soft tissues and spinal canal: No prevertebral fluid or swelling. No visible canal hematoma. Disc levels: Marked severity endplate sclerosis and anterior osteophyte formation is seen at the levels of C3-C4, C4-C5, C5-C6 and C6-C7. Moderate severity intervertebral disc space narrowing is also seen at the levels of C4-C5 and C5-C6. Marked severity intervertebral disc space narrowing is present at the level of C6-C7. Marked severity bilateral multilevel facet joint hypertrophy is noted. Upper chest:  Negative. Other: None. IMPRESSION: 1. Marked severity multilevel degenerative changes, most prominent at the levels of C3-C4, C4-C5, C5-C6 and C6-C7. 2. No acute cervical spine fracture. Electronically Signed   By: Aram Candelahaddeus  Houston M.D.   On: 01/11/2021 20:38   DG Chest Portable 1 View  Result Date: 01/11/2021 CLINICAL DATA:  80 year old male with fall. EXAM: PORTABLE CHEST 1 VIEW COMPARISON:  Chest radiograph dated 12/27/2020. FINDINGS: Background of emphysema. Diffuse interstitial coarsening primarily involving the mid to lower lung field and peripherally. Interval progression of associated airspace densities since the prior radiograph concerning for worsening pneumonia.  Clinical correlation is recommended. No large pleural effusion. No pneumothorax. Stable cardiac silhouette. No acute osseous pathology. IMPRESSION: 1. Findings concerning for worsening pneumonia. Clinical correlation is recommended. 2. Emphysema. Electronically Signed   By: Elgie CollardArash  Radparvar M.D.   On: 01/11/2021 20:05        Scheduled Meds: . allopurinol  300 mg Oral Daily  . apixaban  5 mg Oral BID  . atorvastatin  40 mg Oral QHS  . folic acid  1 mg Oral Daily  . insulin aspart  0-15 Units Subcutaneous TID WC  . insulin aspart  0-5 Units Subcutaneous QHS  . labetalol  10 mg Intravenous Once  . losartan  100 mg Oral Daily  . Memantine HCl-Donepezil HCl  1 capsule Oral Daily  . metoprolol succinate  50 mg Oral Daily  . multivitamin with minerals  1 tablet Oral Daily  . pantoprazole  40 mg Oral Q1200  . sodium chloride flush  3 mL Intravenous Q12H  . thiamine  100 mg Oral Daily   Continuous Infusions: . sodium chloride       LOS: 0 days    Time spent: 40min  Azucena FallenWilliam C Cornie Mccomber, DO [facility]  If 7PM-7AM, please contact night-coverage [email or web address]  01/12/2021, 6:58 AM

## 2021-01-12 NOTE — ED Notes (Signed)
Pt was cleaned or bowel incontinence.

## 2021-01-12 NOTE — ED Notes (Addendum)
Pt c/o "not feeling right" and SOB. O2 sat is 95% on 4lpm via [place]. Pt is anxious and restless. Paged Dr. Julian Reil and updated on pt status and complaints. New orders received. Respiratory Therapy contacted to assess patient as MD may want Bipap for patient.

## 2021-01-12 NOTE — ED Notes (Signed)
Tele Breakfast Order Placed 

## 2021-01-12 NOTE — ED Notes (Signed)
Pt hit call button. Nurse entered room and pt upset that alarms are sounding. Pt stated that he wants to go home if the monitor is going to go off all night. Advised that this nurse cannot guarantee that the alarms won't sound throughout the night and the monitor cannot be turned off due to safety. Pt requesting to leave. Advised pt that I would notify MD of request.

## 2021-01-12 NOTE — ED Notes (Signed)
Attempted to call report to floor 

## 2021-01-12 NOTE — Progress Notes (Signed)
IVT consult note:  Pt sitting at foot of bed coughing;explained IV access placement procedure;stated he is not ready for it. Informed unit RN.

## 2021-01-12 NOTE — ED Notes (Signed)
Pt stood at side of bed to use urinal. Had to sit immediately down due to becoming SOB and O2 sat decreasing to 80% while on 4lpm via . Pt cleaned of urinary incontinence. Linens changed, new gown applied, and applied a condom cath.

## 2021-01-12 NOTE — ED Notes (Signed)
Echo at bedside

## 2021-01-13 DIAGNOSIS — J9601 Acute respiratory failure with hypoxia: Secondary | ICD-10-CM | POA: Diagnosis not present

## 2021-01-13 LAB — CBC
HCT: 40.1 % (ref 39.0–52.0)
Hemoglobin: 14.5 g/dL (ref 13.0–17.0)
MCH: 30.1 pg (ref 26.0–34.0)
MCHC: 36.2 g/dL — ABNORMAL HIGH (ref 30.0–36.0)
MCV: 83.2 fL (ref 80.0–100.0)
Platelets: 150 10*3/uL (ref 150–400)
RBC: 4.82 MIL/uL (ref 4.22–5.81)
RDW: 15.3 % (ref 11.5–15.5)
WBC: 13.2 10*3/uL — ABNORMAL HIGH (ref 4.0–10.5)
nRBC: 0 % (ref 0.0–0.2)

## 2021-01-13 LAB — BASIC METABOLIC PANEL
Anion gap: 15 (ref 5–15)
BUN: 17 mg/dL (ref 8–23)
CO2: 23 mmol/L (ref 22–32)
Calcium: 9 mg/dL (ref 8.9–10.3)
Chloride: 104 mmol/L (ref 98–111)
Creatinine, Ser: 1.33 mg/dL — ABNORMAL HIGH (ref 0.61–1.24)
GFR, Estimated: 55 mL/min — ABNORMAL LOW (ref >60)
Glucose, Bld: 134 mg/dL — ABNORMAL HIGH (ref 70–99)
Potassium: 3.7 mmol/L (ref 3.5–5.1)
Sodium: 142 mmol/L (ref 135–145)

## 2021-01-13 LAB — GLUCOSE, CAPILLARY
Glucose-Capillary: 203 mg/dL — ABNORMAL HIGH (ref 70–99)
Glucose-Capillary: 143 mg/dL — ABNORMAL HIGH (ref 70–99)
Glucose-Capillary: 107 mg/dL — ABNORMAL HIGH (ref 70–99)
Glucose-Capillary: 148 mg/dL — ABNORMAL HIGH (ref 70–99)

## 2021-01-13 LAB — MRSA PCR SCREENING: MRSA by PCR: NEGATIVE

## 2021-01-13 NOTE — Progress Notes (Signed)
PROGRESS NOTE    Gary Mcneil  YNW:[id number] DOB: 01/04/1941 DOA: 01/11/2021 PCP: Harvest Forest, MD   Brief Narrative:  Gary Mcneil is a 80 y.o. 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 Pt recently admitted for acute hypoxic resp failure due to COVID-19 PNA and pulmonary embolism on 2/4.  Also noted to have acute CHF during that admit treated with dose of lasix.  Pt discharged home on O2 with ambulation on 2/10. Pt lives alone with family checking in on him.  They checked on him at noon and he was fine.  They checked back in on him at 4pm they found him down on the floor and he appeared to be unconscious with hypoxia into the 60s on their arrival per report. In ED: CT head and neck is neg. CTA chest: Poor contrast timing, lungs show increasingly confluent opacities compared to earlier this month.  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) and diastolic (congestive) heart failure (HCC)  Acute on chronic resp failure with hypoxia resolving quickly Likely heart failure exacerbation Cannot rule out aspiration or HCAP pneumonia - Continue diuretics in the setting of likely heart failure exacerbation - Hypoxia resolving quickly with diuretics consistent with heart failure exacerbation, would not expect patient to resolve so quickly after 1-2 doses of antibiotics - Negative procalcitonin, continue antibiotics given symptoms concern for aspiration/pneumonitis and high risk for infection: Vancomycin/Zosyn x5  days, will de-escalate appropriately, MRSA nares pending - Repeat echo  - 20-25% with severely decreased function of the left ventricle and global hypokinesis  HTN, essential Continue home medications  History of PE, POA Continue eliquis  Chronic alcohol use    CIWA protocol ongoing, not scoring  DM2 Hold home PO meds Mod scale SSI AC/HS  Dementia Continue home memantine - donepezil  DVT prophylaxis: Eliquis Code Status: Full Family Communication: None available  Status is: Inpatient  Dispo: The patient is from: Home              Anticipated d/c is to: To be determined              Anticipated d/c date is: 24 to 48 hours              Patient currently not medically stable for discharge  Consultants:   None  Procedures:   None  Antimicrobials:  Zosyn, vancomycin  Subjective: No acute issues or events overnight, patient appears to be back to baseline mental status, denies nausea vomiting diarrhea constipation headache fevers or chills.  Objective: Vitals:   01/12/21 1807 01/12/21 2019 01/13/21 0030 01/13/21 0459  BP: (!) 148/95 (!) 150/95 (!) 143/115 130/87  Pulse: (!) 106 (!) 106 (!) 107 (!) 102  Resp: Temp: 98.7 F (37.1 C) 97.8 F (36.6 C) 98.2 F (36.8 C) (!) 97.4 F (36.3 C)  TempSrc: Oral  Oral Oral  SpO2: 95% 100% 98% 95%  Weight:   75.1 kg   Height:        Intake/Output Summary (  Last 24 hours) at 01/13/2021 1610 Last data filed at 01/13/2021 0324 Gross per 24 hour  Intake 250 ml  Output 400 ml  Net -150 ml   Filed Weights   01/11/21 1846 01/13/21 0030  Weight: 82 kg 75.1 kg    Examination:  General exam: Appears calm and comfortable  Respiratory system: Clear to auscultation. Respiratory effort normal. Cardiovascular system: S1 & S2 heard, RRR. No JVD, murmurs, rubs, gallops or clicks. No pedal edema. Gastrointestinal system: Abdomen is nondistended, soft and nontender. No organomegaly or masses felt. Normal bowel sounds  heard. Central nervous system: Alert and oriented. No focal neurological deficits. Extremities: Symmetric 5 x 5 power. Skin: No rashes, lesions or ulcers  Data Reviewed: I have personally reviewed following labs and imaging studies  CBC: Recent Labs  Lab 01/11/21 2020 01/12/21 0717 01/13/21 0341  WBC 10.8*  --  13.2*  NEUTROABS 8.5*  --   --   HGB 13.5 12.6* 14.5  HCT 37.0* 37.0* 40.1  MCV 85.8  --  83.2  PLT 137*  --  150   Basic Metabolic Panel: Recent Labs  Lab 01/11/21 2020 01/12/21 0300 01/12/21 0717 01/13/21 0341  NA 139  --  138 142  K 4.0  --  3.5 3.7  CL 106  --   --  104  CO2 23  --   --  23  GLUCOSE 126*  --   --  134*  BUN 18  --   --  17  CREATININE 0.93  --   --  1.33*  CALCIUM 8.5*  --   --  9.0  MG  --  2.0  --   --   PHOS  --  2.8  --   --    GFR: Estimated Creatinine Clearance: 49.4 mL/min (A) (by C-G formula based on SCr of 1.33 mg/dL (H)). Liver Function Tests: Recent Labs  Lab 01/11/21 2020  AST 32  ALT 25  ALKPHOS 66  BILITOT 1.1  PROT 5.8*  ALBUMIN 2.7*   No results for input(s): LIPASE, AMYLASE in the last 168 hours. No results for input(s): AMMONIA in the last 168 hours. Coagulation Profile: No results for input(s): INR, PROTIME in the last 168 hours. Cardiac Enzymes: No results for input(s): CKTOTAL, CKMB, CKMBINDEX, TROPONINI in the last 168 hours. BNP (last 3 results) No results for input(s): PROBNP in the last 8760 hours. HbA1C: No results for input(s): HGBA1C in the last 72 hours. CBG: Recent Labs  Lab 01/12/21 0836 01/12/21 1214 01/12/21 1701 01/12/21 2141  GLUCAP 129* 141* 94 130*   Lipid Profile: No results for input(s): CHOL, HDL, LDLCALC, TRIG, CHOLHDL, LDLDIRECT in the last 72 hours. Thyroid Function Tests: No results for input(s): TSH, T4TOTAL, FREET4, T3FREE, THYROIDAB in the last 72 hours. Anemia Panel: No results for input(s): VITAMINB12, FOLATE, FERRITIN, TIBC, IRON, RETICCTPCT in the last 72  hours. Sepsis Labs: Recent Labs  Lab 01/11/21 2052 01/11/21 2352  PROCALCITON <0.10  --   LATICACIDVEN  --  1.7    No results found for this or any previous visit (from the past 240 hour(s)).   Radiology Studies: CT Head Wo Contrast  Result Date: 01/11/2021 CLINICAL DATA:  Status post fall.  Recent history of COVID positive. EXAM: CT HEAD WITHOUT CONTRAST TECHNIQUE: Contiguous axial images were obtained from the base of the skull through the vertex without intravenous contrast. COMPARISON:  December 30, 2014 FINDINGS: Brain: There is mild to moderate severity cerebral atrophy with widening  of the extra-axial spaces and stable ventricular dilatation. There are areas of decreased attenuation within the white matter tracts of the supratentorial brain, consistent with microvascular disease changes. Vascular: No hyperdense vessel or unexpected calcification. Skull: Negative for an acute fracture. A stable 9 mm x 5 mm benign-appearing sclerotic focus is seen along the vertex on the right (axial CT image 73, CT series number 4). Sinuses/Orbits: No acute finding. Other: None. IMPRESSION: 1. Generalized cerebral atrophy with stable ventriculomegaly. A component of superimposed communicating hydrocephalus cannot be excluded. 2. No acute intracranial abnormality. Electronically Signed   By: Aram Candelahaddeus  Houston M.D.   On: 01/11/2021 20:34   CT Angio Chest PE W and/or Wo Contrast  Result Date: 01/11/2021 CLINICAL DATA:  Weakness and shortness of breath after discharge for COVID 19 EXAM: CT ANGIOGRAPHY CHEST WITH CONTRAST TECHNIQUE: Multidetector CT imaging of the chest was performed using the standard protocol during bolus administration of intravenous contrast. Multiplanar CT image reconstructions and MIPs were obtained to evaluate the vascular anatomy. CONTRAST:  80mL OMNIPAQUE IOHEXOL 350 MG/ML SOLN, 105mL OMNIPAQUE IOHEXOL 350 MG/ML SOLN COMPARISON:  CT 12/24/2020 FINDINGS: Cardiovascular: Initial contrast  administration resulting in suboptimal bolus timing significantly limiting detection of pulmonary artery emboli. Repeat imaging was performed with a slightly improved central pulmonary artery attenuation though port technique results in persistently suboptimal distal opacification beyond the left and right main pulmonary arteries with exam further degraded by extensive respiratory motion artifact. No large central pulmonary artery filling defects are identified. A previously seen subsegmental filling defect in the right lower lobe is beyond the limits of detection on this study. Central pulmonary arteries are notably enlarged though similar to comparison. Normal heart size. No pericardial effusion. Three-vessel coronary artery atherosclerosis. Atherosclerotic plaque within the normal caliber aorta. Suboptimal opacification for a luminal assessment of the aorta. No gross aortic abnormality is discernible. Normal 3 vessel branching of the great vessels. Minimal plaque in the proximal great vessels. Mediastinum/Nodes: No mediastinal fluid or gas. Normal thyroid gland and thoracic inlet. No acute abnormality of the trachea or esophagus. No worrisome mediastinal, hilar or axillary adenopathy. Lungs/Pleura: Diffuse heterogeneous opacities again seen throughout the lungs, with increasingly coalescent consolidative opacity in the regions of previously seen ground-glass compatible with a worsening infectious process. Findings on a background of centrilobular and paraseptal emphysematous change. Diffuse airways thickening and scattered secretions are noted. No pneumothorax. Small left pleural effusion is present. Dependent atelectatic features bilaterally. Upper Abdomen: Stable fluid attenuation cyst in the upper pole left kidney. No acute abnormalities present in the visualized portions of the upper abdomen. Musculoskeletal: Degenerative changes are present in the imaged spine and shoulders. No acute osseous abnormality or  suspicious osseous lesion. No worrisome chest wall masses or lesions. Review of the MIP images confirms the above findings. IMPRESSION: 1. Markedly suboptimal pulmonary artery assessment given technical factors as detailed above. No large central filling defects are seen. A previously seen subsegmental filling defect in the right lower lobe is beyond the limits of detection. Chronic pulmonary artery enlargement most compatible with pulmonary artery hypertension. 2. Diffuse heterogeneous opacities again seen throughout the lungs, with increasingly coalescent consolidative opacity in the regions of previously seen ground-glass compatible with a worsening infectious process. 3. Small left pleural effusion. 4. Three-vessel coronary artery atherosclerosis. 5.  Emphysema (ICD10-J43.9). 6. Aortic Atherosclerosis (ICD10-I70.0) Electronically Signed   By: Kreg ShropshirePrice  DeHay M.D.   On: 01/11/2021 22:49   CT Cervical Spine Wo Contrast  Result Date: 01/11/2021 CLINICAL DATA:  Status post  fall. EXAM: CT CERVICAL SPINE WITHOUT CONTRAST TECHNIQUE: Multidetector CT imaging of the cervical spine was performed without intravenous contrast. Multiplanar CT image reconstructions were also generated. COMPARISON:  None. FINDINGS: Alignment: There is approximately 2 mm anterolisthesis of the C2 vertebral body on C3. Skull base and vertebrae: No acute fracture. Chronic changes are seen along the tip of the dens. Soft tissues and spinal canal: No prevertebral fluid or swelling. No visible canal hematoma. Disc levels: Marked severity endplate sclerosis and anterior osteophyte formation is seen at the levels of C3-C4, C4-C5, C5-C6 and C6-C7. Moderate severity intervertebral disc space narrowing is also seen at the levels of C4-C5 and C5-C6. Marked severity intervertebral disc space narrowing is present at the level of C6-C7. Marked severity bilateral multilevel facet joint hypertrophy is noted. Upper chest: Negative. Other: None. IMPRESSION: 1.  Marked severity multilevel degenerative changes, most prominent at the levels of C3-C4, C4-C5, C5-C6 and C6-C7. 2. No acute cervical spine fracture. Electronically Signed   By: Aram Candela M.D.   On: 01/11/2021 20:38   DG CHEST PORT 1 VIEW  Result Date: 01/12/2021 CLINICAL DATA:  Hypoxia. EXAM: PORTABLE CHEST 1 VIEW COMPARISON:  January 11, 2021. FINDINGS: Stable cardiomediastinal silhouette. No pneumothorax is noted. Stable bilateral lung opacities are noted concerning for pneumonia. Bony thorax is unremarkable. IMPRESSION: Stable bilateral lung opacities are noted concerning for pneumonia. Electronically Signed   By: Lupita Raider M.D.   On: 01/12/2021 08:12   DG Chest Portable 1 View  Result Date: 01/11/2021 CLINICAL DATA:  80 year old male with fall. EXAM: PORTABLE CHEST 1 VIEW COMPARISON:  Chest radiograph dated 12/27/2020. FINDINGS: Background of emphysema. Diffuse interstitial coarsening primarily involving the mid to lower lung field and peripherally. Interval progression of associated airspace densities since the prior radiograph concerning for worsening pneumonia. Clinical correlation is recommended. No large pleural effusion. No pneumothorax. Stable cardiac silhouette. No acute osseous pathology. IMPRESSION: 1. Findings concerning for worsening pneumonia. Clinical correlation is recommended. 2. Emphysema. Electronically Signed   By: Elgie Collard M.D.   On: 01/11/2021 20:05   ECHOCARDIOGRAM LIMITED  Result Date: 01/12/2021    ECHOCARDIOGRAM LIMITED REPORT   Patient Name:   CHUKWUMA STRAUS Date of Exam: 01/12/2021 Medical Rec #:  [id number]       Height:       72.0 in Accession #:    [id number]      Weight:       180.8 lb Date of Birth:  30-Sep-1941        BSA:          2.041 m Patient Age:    79 years        BP:           150/139 mmHg Patient Gender: M               HR:           101 bpm. Exam Location:  Inpatient Procedure: Limited Echo, Limited Color Doppler and Cardiac Doppler  Indications:    CHF-Acute Systolic I50.21                 CHF-Acute Diastolic I50.31  History:        Patient has prior history of Echocardiogram examinations, most                 recent 12/25/2020. CHF; Risk Factors:Dyslipidemia, Hypertension                 and Diabetes.  Sonographer:    Eulah Pont RDCS Referring Phys: [id number] JARED M GARDNER IMPRESSIONS  1. Left ventricular ejection fraction, by estimation, is 20 to 25%. The left ventricle has severely decreased function. The left ventricle demonstrates global hypokinesis. Left ventricular diastolic function could not be evaluated.  2. Right ventricular systolic function is mildly reduced. The right ventricular size is normal. There is mildly elevated pulmonary artery systolic pressure. The estimated right ventricular systolic pressure is 40.7 mmHg.  3. The mitral valve is normal in structure. Trivial mitral valve regurgitation. No evidence of mitral stenosis.  4. The aortic valve was not well visualized. Aortic valve regurgitation is mild. Mild aortic valve sclerosis is present, with no evidence of aortic valve stenosis. Aortic regurgitation PHT measures 501 msec.  5. The inferior vena cava is normal in size with greater than 50% respiratory variability, suggesting right atrial pressure of 3 mmHg. FINDINGS  Left Ventricle: Left ventricular ejection fraction, by estimation, is 20 to 25%. The left ventricle has severely decreased function. The left ventricle demonstrates global hypokinesis. The left ventricular internal cavity size was normal in size. There is no left ventricular hypertrophy. Left ventricular diastolic function could not be evaluated. Right Ventricle: The right ventricular size is normal. No increase in right ventricular wall thickness. Right ventricular systolic function is mildly reduced. There is mildly elevated pulmonary artery systolic pressure. The tricuspid regurgitant velocity  is 3.07 m/s, and with an assumed right atrial pressure of 3  mmHg, the estimated right ventricular systolic pressure is 40.7 mmHg. Left Atrium: Left atrial size was normal in size. Right Atrium: Right atrial size was normal in size. Pericardium: There is no evidence of pericardial effusion. Mitral Valve: The mitral valve is normal in structure. Trivial mitral valve regurgitation. No evidence of mitral valve stenosis. Tricuspid Valve: The tricuspid valve is normal in structure. Tricuspid valve regurgitation is mild . No evidence of tricuspid stenosis. Aortic Valve: The aortic valve was not well visualized. Aortic valve regurgitation is mild. Aortic regurgitation PHT measures 501 msec. Mild aortic valve sclerosis is present, with no evidence of aortic valve stenosis. Pulmonic Valve: The pulmonic valve was normal in structure. Pulmonic valve regurgitation is not visualized. No evidence of pulmonic stenosis. Aorta: The aortic root is normal in size and structure. Venous: The inferior vena cava is normal in size with greater than 50% respiratory variability, suggesting right atrial pressure of 3 mmHg. IAS/Shunts: No atrial level shunt detected by color flow Doppler. LEFT VENTRICLE PLAX 2D LVIDd:         5.20 cm LVIDs:         4.40 cm LV PW:         0.80 cm LV IVS:        0.90 cm LVOT diam:     1.60 cm LV SV:         26 LV SV Index:   13 LVOT Area:     2.01 cm  LV Volumes (MOD) LV vol d, MOD A2C: 131.0 ml LV vol d, MOD A4C: 117.0 ml LV vol s, MOD A2C: 99.3 ml LV vol s, MOD A4C: 71.4 ml LV SV MOD A2C:     31.7 ml LV SV MOD A4C:     117.0 ml LV SV MOD BP:      38.2 ml RIGHT VENTRICLE TAPSE (M-mode): 1.4 cm LEFT ATRIUM         Index LA diam:    2.10 cm 1.03 cm/m  AORTIC VALVE LVOT Vmax:  85.50 cm/s LVOT Vmean:  56.200 cm/s LVOT VTI:    0.129 m AI PHT:      501 msec  AORTA Ao Root diam: 3.30 cm TRICUSPID VALVE TR Peak grad:   37.7 mmHg TR Vmax:        307.00 cm/s  SHUNTS Systemic VTI:  0.13 m Systemic Diam: 1.60 cm Armanda Magic MD Electronically signed by Armanda Magic MD Signature  Date/Time: 01/12/2021/5:06:13 PM    Final    Scheduled Meds: . allopurinol  300 mg Oral Daily  . apixaban  5 mg Oral BID  . atorvastatin  40 mg Oral QHS  . memantine  28 mg Oral Daily   And  . donepezil  10 mg Oral QHS  . folic acid  1 mg Oral Daily  . furosemide  40 mg Intravenous BID  . insulin aspart  0-15 Units Subcutaneous TID WC  . insulin aspart  0-5 Units Subcutaneous QHS  . losartan  100 mg Oral Daily  . metoprolol succinate  50 mg Oral Daily  . multivitamin with minerals  1 tablet Oral Daily  . pantoprazole  40 mg Oral Q1200  . sodium chloride flush  3 mL Intravenous Q12H  . thiamine  100 mg Oral Daily   Continuous Infusions: . sodium chloride    . piperacillin-tazobactam (ZOSYN)  IV 3.375 g (01/13/21 0501)  . vancomycin Stopped (01/13/21 0227)     LOS: 1 day   Time spent:  Azucena Fallen, DO [facility]  If 7PM-7AM, please contact night-coverage [email or web address]  01/13/2021, 7:12 AM

## 2021-01-13 NOTE — Progress Notes (Signed)
Heart Failure Nurse Navigator Progress Note  PCP: Har[place], MD PCP-Cardiologist: none on file Admission Diagnosis: acute resp failure Admitted from: home alone  Presentation:   Edwin Dada presented with SOB, found down at home by family member after about 4 hours from last known well. Pt does not remember what happened. SpO2 initially 60s, BiPAP initiated. On 4 LPM when interviewed today, pt states he is on oxygen when at home but couldn't recall how much. Pt states he does not drink, but prior notes are positive for significant alcohol use/abuse. Pt able to answer orientation questions appropriately, but cognitive function potential concern. Pt lives alone, but has 3 grown children in the area that check on him regularly.   ECHO/ LVEF: 20-25%, mildly reduced.  Clinical Course:  Past Medical History:  Diagnosis Date  . "walking corpse" syndrome   . Acute kidney failure, unspecified (HCC)   . Acute kidney failure, unspecified (HCC)   . Acute on chronic combined systolic and diastolic CHF (congestive heart failure) (HCC) 01/12/2021  . Anemia, unspecified   . Arthritis   . Dementia   . Dementia (HCC) 04/24/2012  . Disorder of bone and cartilage, unspecified   . Dizziness and giddiness   . GERD (gastroesophageal reflux disease)   . Gout   . Gout, unspecified   . Hypertension   . Hypopotassemia   . Hypotension, unspecified   . Hypotension, unspecified   . Memory loss   . Memory loss   . Muscle weakness (generalized)   . Myalgia and myositis, unspecified   . Neuromuscular disorder (HCC)   . Other and unspecified hyperlipidemia   . Screening for ischemic heart disease   . Tobacco use disorder   . Type II or unspecified type diabetes mellitus without mention of complication, uncontrolled   . Type II or unspecified type diabetes mellitus without mention of complication, uncontrolled   . Unspecified hereditary and idiopathic peripheral neuropathy   . Unspecified vitamin  D deficiency      Social History   Socioeconomic History  . Marital status: Single    Spouse name: Not on file  . Number of children: 3  . Years of education: Not on file  . Highest education level: Not on file  Occupational History  . Occupation: Retired Charity fundraiser from [employer]  . Occupation: Retired Education officer, environmental  Tobacco Use  . Smoking status: Former Smoker    Quit date: 11/20/1969    Years since quitting: 51.1  . Smokeless tobacco: Never Used  Vaping Use  . Vaping Use: Never used  Substance and Sexual Activity  . Alcohol use: Yes    Comment: daily- to every other day, combination or beer, wine, and liquor.per son patient drinks beer and liquor heavy  . Drug use: Not Currently    Types: Marijuana  . Sexual activity: Not Currently  Other Topics Concern  . Not on file  Social History Narrative   Bachelors in Nurse, adult in [place]   Attended [employer] and [employer]            Social Determinants of Health   Financial Resource Strain: Low Risk   . Difficulty of Paying Living Expenses: Not hard at all  Food Insecurity: No Food Insecurity  . Worried About Programme researcher, broadcasting/film/video in the Last Year: Never true  . Ran Out of Food in the Last Year: Never true  Transportation Needs: No Transportation Needs  . Lack of Transportation (Medical): No  . Lack  of Transportation (Non-Medical): No  Physical Activity: Not on file  Stress: Not on file  Social Connections: Not on file    High Risk Criteria for Readmission and/or Poor Patient Outcomes:  Heart failure hospital admissions (last 6 months): 1   No Show rate: 16%  Difficult social situation: yes  Demonstrates medication adherence: no  Primary Language: English  Literacy level: able to read/write. Concern for adequate/safe self-care.   Barriers of Care:   -cognitive function, hx dementia -medication compliance -safety at home  Considerations/Referrals:   Referral made to Heart Failure Pharmacist  Stewardship: yes, appreciated Referral made to Heart & Vascular TOC clinic: will follow to assess progression of hospitalization.   Items for Follow-up on DC/TOC: -medication optimization -medication compliance: pt states son sets up weekly medications, but pt has to remember to take them. -safe discharge plan -alcohol counseling  Ozella Rocks, RN, BSN Heart Failure Nurse Navigator [phone]

## 2021-01-13 NOTE — Discharge Instructions (Signed)
Information on my medicine - ELIQUIS (apixaban)  This medication education was reviewed with me or my healthcare representative as part of my discharge preparation.  The pharmacist that spoke with me during my hospital stay was:  Lori Liew, RPH-CPP  Why was Eliquis prescribed for you? Eliquis was prescribed to treat blood clots that may have been found in the veins of your legs (deep vein thrombosis) or in your lungs (pulmonary embolism) and to reduce the risk of them occurring again.  What do You need to know about Eliquis ? Continue Eliquis 5mg TWICE daily.  Eliquis may be taken with or without food.   Try to take the dose about the same time in the morning and in the evening. If you have difficulty swallowing the tablet whole please discuss with your pharmacist how to take the medication safely.  Take Eliquis exactly as prescribed and DO NOT stop taking Eliquis without talking to the doctor who prescribed the medication.  Stopping may increase your risk of developing a new blood clot.  Refill your prescription before you run out.  After discharge, you should have regular check-up appointments with your healthcare provider that is prescribing your Eliquis.    What do you do if you miss a dose? If a dose of ELIQUIS is not taken at the scheduled time, take it as soon as possible on the same day and twice-daily administration should be resumed. The dose should not be doubled to make up for a missed dose.  Important Safety Information A possible side effect of Eliquis is bleeding. You should call your healthcare provider right away if you experience any of the following: ? Bleeding from an injury or your nose that does not stop. ? Unusual colored urine (red or dark brown) or unusual colored stools (red or black). ? Unusual bruising for unknown reasons. ? A serious fall or if you hit your head (even if there is no bleeding).  Some medicines may interact with Eliquis and might  increase your risk of bleeding or clotting while on Eliquis. To help avoid this, consult your healthcare provider or pharmacist prior to using any new prescription or non-prescription medications, including herbals, vitamins, non-steroidal anti-inflammatory drugs (NSAIDs) and supplements.  This website has more information on Eliquis (apixaban): http://[email or web address].com/eliquis/home   

## 2021-01-13 NOTE — Progress Notes (Signed)
Pt not in need of BIPAP at this time. Pts respiratory status stable on [place] 4 Lpm w/no distress noted at this time. RT will continue to monitor.

## 2021-01-14 ENCOUNTER — Inpatient Hospital Stay (HOSPITAL_COMMUNITY): Payer: Medicare Other

## 2021-01-14 DIAGNOSIS — J9601 Acute respiratory failure with hypoxia: Secondary | ICD-10-CM | POA: Diagnosis not present

## 2021-01-14 LAB — BASIC METABOLIC PANEL
Anion gap: 13 (ref 5–15)
BUN: 32 mg/dL — ABNORMAL HIGH (ref 8–23)
CO2: 23 mmol/L (ref 22–32)
Calcium: 8.4 mg/dL — ABNORMAL LOW (ref 8.9–10.3)
Chloride: 105 mmol/L (ref 98–111)
Creatinine, Ser: 1.84 mg/dL — ABNORMAL HIGH (ref 0.61–1.24)
GFR, Estimated: 37 mL/min — ABNORMAL LOW (ref >60)
Glucose, Bld: 143 mg/dL — ABNORMAL HIGH (ref 70–99)
Potassium: 2.9 mmol/L — ABNORMAL LOW (ref 3.5–5.1)
Sodium: 141 mmol/L (ref 135–145)

## 2021-01-14 LAB — GLUCOSE, CAPILLARY
Glucose-Capillary: 125 mg/dL — ABNORMAL HIGH (ref 70–99)
Glucose-Capillary: 160 mg/dL — ABNORMAL HIGH (ref 70–99)
Glucose-Capillary: 111 mg/dL — ABNORMAL HIGH (ref 70–99)
Glucose-Capillary: 114 mg/dL — ABNORMAL HIGH (ref 70–99)

## 2021-01-14 LAB — CBC
HCT: 36.8 % — ABNORMAL LOW (ref 39.0–52.0)
Hemoglobin: 12.8 g/dL — ABNORMAL LOW (ref 13.0–17.0)
MCH: 29.1 pg (ref 26.0–34.0)
MCHC: 34.8 g/dL (ref 30.0–36.0)
MCV: 83.6 fL (ref 80.0–100.0)
Platelets: 126 10*3/uL — ABNORMAL LOW (ref 150–400)
RBC: 4.4 MIL/uL (ref 4.22–5.81)
RDW: 15.2 % (ref 11.5–15.5)
WBC: 12.3 10*3/uL — ABNORMAL HIGH (ref 4.0–10.5)
nRBC: 0 % (ref 0.0–0.2)

## 2021-01-14 MED ORDER — POTASSIUM CHLORIDE CRYS ER 20 MEQ PO TBCR
40.0000 meq | EXTENDED_RELEASE_TABLET | ORAL | Status: AC
  Administered 2021-01-14 (×2): 40 meq via ORAL
  Filled 2021-01-14 (×2): qty 2

## 2021-01-14 MED ORDER — POTASSIUM CHLORIDE CRYS ER 20 MEQ PO TBCR: 40.0000 meq | EXTENDED_RELEASE_TABLET | Freq: Two times a day (BID) | ORAL | Status: DC

## 2021-01-14 MED ORDER — LACTATED RINGERS IV BOLUS
500.0000 mL | Freq: Once | INTRAVENOUS | Status: AC
  Administered 2021-01-15: 500 mL via INTRAVENOUS

## 2021-01-14 MED ORDER — AMOXICILLIN-POT CLAVULANATE 875-125 MG PO TABS
1.0000 | ORAL_TABLET | Freq: Two times a day (BID) | ORAL | Status: AC
  Administered 2021-01-14 – 2021-01-17 (×8): 1 via ORAL
  Filled 2021-01-14 (×8): qty 1

## 2021-01-14 NOTE — Progress Notes (Signed)
PROGRESS NOTE    Gary Mcneil  CVE:[id number] DOB: [birth year] DOA: 01/11/2021 PCP: Gary Forest, MD   Brief Narrative:  Gary Mcneil is a 80 y.o. 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 Pt recently admitted for acute hypoxic resp failure due to COVID-19 PNA and pulmonary embolism on 2/4.  Also noted to have acute CHF during that admit treated with dose of lasix.  Pt discharged home on O2 with ambulation on 2/10. Pt lives alone with family checking in on him.  They checked on him at noon and he was fine.  They checked back in on him at 4pm they found him down on the floor and he appeared to be unconscious with hypoxia into the 60s on their arrival per report. In ED: CT head and neck is neg. CTA chest: Poor contrast timing, lungs show increasingly confluent opacities compared to earlier this month.  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) and diastolic (congestive) heart failure (HCC)   Acute on chronic resp failure with hypoxia resolving quickly Likely heart failure exacerbation Cannot rule out aspiration or HCAP pneumonia - Hold IV diuretics -creatinine bump overnight - Hypoxia resolving quickly with diuretics consistent with heart failure exacerbation, would not expect patient to resolve so quickly after 1-2 doses of antibiotics - Negative procalcitonin, continue antibiotics given symptoms concern for aspiration/pneumonitis and high risk for infection: Vancomycin/Zosyn discontinued, de-escalating to  Augmentin, MRSA negative - Repeat echo  - 20-25% with severely decreased function of the left ventricle and global hypokinesis  Mild AKI without history of CKD the setting of diuretics  -Holding IV Lasix as above  HTN, essential Continue home medications  History of PE, POA Continue eliquis  Chronic alcohol use    CIWA protocol ongoing, not scoring  DM2 Hold home PO meds Mod scale SSI AC/HS  Dementia Continue home memantine - donepezil  DVT prophylaxis: Eliquis Code Status: Full Family Communication: None available  Status is: Inpatient  Dispo: The patient is from: Home              Anticipated d/c is to: To be determined              Anticipated d/c date is: 24 to 48 hours              Patient currently not medically stable for discharge  Consultants:   None  Procedures:   None  Antimicrobials:  Zosyn, vancomycin -discontinued 01/14/2021 Augmentin  Subjective: No acute issues or events overnight, patient's mental status continues to wax and wane but generally improving.  Patient denies overt symptoms of chest pain, nausea, vomiting, diarrhea, constipation, headache, fevers, chills.  Objective: Vitals:   01/13/21 1550 01/13/21 2134 01/14/21 0113 01/14/21 0331  BP: 113/85 117/78  122/73  Pulse: (!) 105 (!) 101  (!) 102  Resp: 19 16  16   Temp: 98 F (36.7 C) 99.2 F (37.3 C)  97.7 F (36.5 C)  TempSrc: Oral Oral  Oral  SpO2: 96% 97%  97%  Weight:  76.3 kg   Height:        Intake/Output Summary (Last 24 hours) at 01/14/2021 0733 Last data filed at 01/14/2021 0357 Gross per 24 hour  Intake 894.85 ml  Output 400 ml  Net 494.85 ml   Filed Weights   01/11/21 1846 01/13/21 0030 01/14/21 0113  Weight: 82 kg 75.1 kg 76.3 kg    Examination:  General:  Pleasantly resting in bed, No acute distress. HEENT:  Normocephalic atraumatic.  Sclerae nonicteric, noninjected.  Extraocular movements intact bilaterally. Neck:  Without mass or deformity.  Trachea  is midline. Lungs: Scant bibasilar rales without overt wheeze or rhonchi. Heart:  Regular rate and rhythm.  Without murmurs, rubs, or gallops. Abdomen:  Soft, nontender, nondistended.  Without guarding or rebound. Extremities: Without cyanosis, clubbing, edema, or obvious deformity. Vascular:  Dorsalis pedis and posterior tibial pulses palpable bilaterally. Skin:  Warm and dry, no erythema, no ulcerations.   Data Reviewed: I have personally reviewed following labs and imaging studies  CBC: Recent Labs  Lab 01/11/21 2020 01/12/21 0717 01/13/21 0341 01/14/21 0249  WBC 10.8*  --  13.2* 12.3*  NEUTROABS 8.5*  --   --   --   HGB 13.5 12.6* 14.5 12.8*  HCT 37.0* 37.0* 40.1 36.8*  MCV 85.8  --  83.2 83.6  PLT 137*  --  150 126*   Basic Metabolic Panel: Recent Labs  Lab 01/11/21 2020 01/12/21 0300 01/12/21 0717 01/13/21 0341 01/14/21 0249  NA 139  --  138 142 141  K 4.0  --  3.5 3.7 2.9*  CL 106  --   --  104 105  CO2 23  --   --  23 23  GLUCOSE 126*  --   --  134* 143*  BUN 18  --   --  17 32*  CREATININE 0.93  --   --  1.33* 1.84*  CALCIUM 8.5*  --   --  9.0 8.4*  MG  --  2.0  --   --   --   PHOS  --  2.8  --   --   --    GFR: Estimated Creatinine Clearance: 36.3 mL/min (A) (by C-G formula based on SCr of 1.84 mg/dL (H)). Liver Function Tests: Recent Labs  Lab 01/11/21 2020  AST 32  ALT 25  ALKPHOS 66  BILITOT 1.1  PROT 5.8*  ALBUMIN 2.7*   No results for input(s): LIPASE, AMYLASE in the last 168 hours. No results for input(s): AMMONIA in the last 168 hours. Coagulation Profile: No results for input(s): INR, PROTIME in the last 168 hours. Cardiac Enzymes: No results for input(s): CKTOTAL, CKMB, CKMBINDEX, TROPONINI in the last 168 hours. BNP (last 3 results) No results for input(s): PROBNP in the last 8760 hours. HbA1C: No results for input(s): HGBA1C in the last 72 hours. CBG: Recent Labs  Lab 01/13/21 0733 01/13/21 1224 01/13/21 1552 01/13/21 2141  01/14/21 0616  GLUCAP 203* 143* 107* 148* 125*   Lipid Profile: No results for input(s): CHOL, HDL, LDLCALC, TRIG, CHOLHDL, LDLDIRECT in the last 72 hours. Thyroid Function Tests: No results for input(s): TSH, T4TOTAL, FREET4, T3FREE, THYROIDAB in the last 72 hours. Anemia Panel: No results for input(s): VITAMINB12, FOLATE, FERRITIN, TIBC, IRON, RETICCTPCT in the last 72 hours. Sepsis Labs: Recent Labs  Lab 01/11/21 2052 01/11/21 2352  PROCALCITON <0.10  --   LATICACIDVEN  --  1.7    Recent Results (from the past 240 hour(s))  Blood culture (routine  x 2)     Status: None (Preliminary result)   Collection Time: 01/11/21 11:36 PM   Specimen: BLOOD RIGHT HAND  Result Value Ref Range Status   Specimen Description BLOOD RIGHT HAND  Final   Special Requests   Final    BOTTLES DRAWN AEROBIC AND ANAEROBIC Blood Culture results may not be optimal due to an inadequate volume of blood received in culture bottles   Culture   Final    NO GROWTH 1 DAY Performed at [facility], [address] [place], [place] [address]    Report Status PENDING  Incomplete  Blood culture (routine x 2)     Status: None (Preliminary result)   Collection Time: 01/12/21  4:44 AM   Specimen: BLOOD  Result Value Ref Range Status   Specimen Description BLOOD SITE NOT SPECIFIED  Final   Special Requests   Final    AEROBIC BOTTLE ONLY Blood Culture results may not be optimal due to an inadequate volume of blood received in culture bottles   Culture   Final    NO GROWTH 1 DAY Performed at [facility], [address] [place], [place] [address]    Report Status PENDING  Incomplete  MRSA PCR Screening     Status: None   Collection Time: 01/13/21  6:32 PM   Specimen: Nasopharyngeal  Result Value Ref Range Status   MRSA by PCR NEGATIVE NEGATIVE Final    Comment:        The GeneXpert MRSA Assay (FDA approved for NASAL specimens only), is one component of a comprehensive MRSA  colonization surveillance program. It is not intended to diagnose MRSA infection nor to guide or monitor treatment for MRSA infections. Performed at [facility], [address], [place], [place] [address]      Radiology Studies: DG CHEST PORT 1 VIEW  Result Date: 01/12/2021 CLINICAL DATA:  Hypoxia. EXAM: PORTABLE CHEST 1 VIEW COMPARISON:  January 11, 2021. FINDINGS: Stable cardiomediastinal silhouette. No pneumothorax is noted. Stable bilateral lung opacities are noted concerning for pneumonia. Bony thorax is unremarkable. IMPRESSION: Stable bilateral lung opacities are noted concerning for pneumonia. Electronically Signed   By: Lupita Raider M.D.   On: 01/12/2021 08:12   ECHOCARDIOGRAM LIMITED  Result Date: 01/12/2021    ECHOCARDIOGRAM LIMITED REPORT   Patient Name:   Gary Mcneil Date of Exam: 01/12/2021 Medical Rec #:  [id number]       Height:       72.0 in Accession #:    [id number]      Weight:       180.8 lb Date of Birth:  10-23-1941        BSA:          2.041 m Patient Age:    79 years        BP:           150/139 mmHg Patient Gender: M               HR:           101 bpm. Exam Location:  Inpatient Procedure: Limited Echo, Limited Color Doppler and Cardiac Doppler Indications:    CHF-Acute Systolic I50.21                 CHF-Acute Diastolic I50.31  History:        Patient has prior history of Echocardiogram examinations, most  recent 12/25/2020. CHF; Risk Factors:Dyslipidemia, Hypertension                 and Diabetes.  Sonographer:    Eulah Pont RDCS Referring Phys: [phone] JARED M GARDNER IMPRESSIONS  1. Left ventricular ejection fraction, by estimation, is 20 to 25%. The left ventricle has severely decreased function. The left ventricle demonstrates global hypokinesis. Left ventricular diastolic function could not be evaluated.  2. Right ventricular systolic function is mildly reduced. The right ventricular size is normal. There is mildly elevated pulmonary artery  systolic pressure. The estimated right ventricular systolic pressure is 40.7 mmHg.  3. The mitral valve is normal in structure. Trivial mitral valve regurgitation. No evidence of mitral stenosis.  4. The aortic valve was not well visualized. Aortic valve regurgitation is mild. Mild aortic valve sclerosis is present, with no evidence of aortic valve stenosis. Aortic regurgitation PHT measures 501 msec.  5. The inferior vena cava is normal in size with greater than 50% respiratory variability, suggesting right atrial pressure of 3 mmHg. FINDINGS  Left Ventricle: Left ventricular ejection fraction, by estimation, is 20 to 25%. The left ventricle has severely decreased function. The left ventricle demonstrates global hypokinesis. The left ventricular internal cavity size was normal in size. There is no left ventricular hypertrophy. Left ventricular diastolic function could not be evaluated. Right Ventricle: The right ventricular size is normal. No increase in right ventricular wall thickness. Right ventricular systolic function is mildly reduced. There is mildly elevated pulmonary artery systolic pressure. The tricuspid regurgitant velocity  is 3.07 m/s, and with an assumed right atrial pressure of 3 mmHg, the estimated right ventricular systolic pressure is 40.7 mmHg. Left Atrium: Left atrial size was normal in size. Right Atrium: Right atrial size was normal in size. Pericardium: There is no evidence of pericardial effusion. Mitral Valve: The mitral valve is normal in structure. Trivial mitral valve regurgitation. No evidence of mitral valve stenosis. Tricuspid Valve: The tricuspid valve is normal in structure. Tricuspid valve regurgitation is mild . No evidence of tricuspid stenosis. Aortic Valve: The aortic valve was not well visualized. Aortic valve regurgitation is mild. Aortic regurgitation PHT measures 501 msec. Mild aortic valve sclerosis is present, with no evidence of aortic valve stenosis. Pulmonic Valve:  The pulmonic valve was normal in structure. Pulmonic valve regurgitation is not visualized. No evidence of pulmonic stenosis. Aorta: The aortic root is normal in size and structure. Venous: The inferior vena cava is normal in size with greater than 50% respiratory variability, suggesting right atrial pressure of 3 mmHg. IAS/Shunts: No atrial level shunt detected by color flow Doppler. LEFT VENTRICLE PLAX 2D LVIDd:         5.20 cm LVIDs:         4.40 cm LV PW:         0.80 cm LV IVS:        0.90 cm LVOT diam:     1.60 cm LV SV:         26 LV SV Index:   13 LVOT Area:     2.01 cm  LV Volumes (MOD) LV vol d, MOD A2C: 131.0 ml LV vol d, MOD A4C: 117.0 ml LV vol s, MOD A2C: 99.3 ml LV vol s, MOD A4C: 71.4 ml LV SV MOD A2C:     31.7 ml LV SV MOD A4C:     117.0 ml LV SV MOD BP:      38.2 ml RIGHT VENTRICLE TAPSE (M-mode): 1.4 cm LEFT  ATRIUM         Index LA diam:    2.10 cm 1.03 cm/m  AORTIC VALVE LVOT Vmax:   85.50 cm/s LVOT Vmean:  56.200 cm/s LVOT VTI:    0.129 m AI PHT:      501 msec  AORTA Ao Root diam: 3.30 cm TRICUSPID VALVE TR Peak grad:   37.7 mmHg TR Vmax:        307.00 cm/s  SHUNTS Systemic VTI:  0.13 m Systemic Diam: 1.60 cm Armanda Magicraci Turner MD Electronically signed by Armanda Magicraci Turner MD Signature Date/Time: 01/12/2021/5:06:13 PM    Final    Scheduled Meds:  allopurinol  300 mg Oral Daily   apixaban  5 mg Oral BID   atorvastatin  40 mg Oral QHS   memantine  28 mg Oral Daily   And   donepezil  10 mg Oral QHS   folic acid  1 mg Oral Daily   furosemide  40 mg Intravenous BID   insulin aspart  0-15 Units Subcutaneous TID WC   insulin aspart  0-5 Units Subcutaneous QHS   losartan  100 mg Oral Daily   metoprolol succinate  50 mg Oral Daily   multivitamin with minerals  1 tablet Oral Daily   pantoprazole  40 mg Oral Q1200   sodium chloride flush  3 mL Intravenous Q12H   thiamine  100 mg Oral Daily   Continuous Infusions:  sodium chloride     piperacillin-tazobactam (ZOSYN)  IV 3.375 g  (01/14/21 0512)   vancomycin Stopped (01/14/21 0253)     LOS: 2 days   Time spent: 40min  Azucena FallenWilliam C Krislynn Gronau, DO [facility]  If 7PM-7AM, please contact night-coverage [email or web address]  01/14/2021, 7:33 AM

## 2021-01-14 NOTE — TOC Initial Note (Signed)
Transition of Care [facility]) - Initial/Assessment Note    Patient Details  Name: Gary Mcneil MRN: [id number] Date of Birth: [birth year]  [facility]) CM/SW Contact:    Erin Sons, LCSW Phone Number: 01/14/2021, 4:18 PM  Clinical Narrative:                  Pt is not oriented. CSW called pt son Eligha Kmetz.; no answer left voicemail.  CSW called daughter Jasmine December; no answer left voicemail.   CSW called daughter Maximiliano Cromartie. She answered and CSW discussed snf versus HH. Family has been working on getting pt an assisted living facility. [facility] did add Child psychotherapist services to [facility] to assist with ALF placement. Daughter explains that family cannot manage pt at home and do want pt to go to SNF for short term rehab prior to returning home or to ALF. No preferences in facilities at this time. CSW completed FL2 and faxed bed requests in the hub. Daughter states pt would need a memory care unit.   Expected Discharge Plan: Skilled Nursing Facility     Patient Goals and CMS Choice Patient states their goals for this hospitalization and ongoing recovery are:: Daughter wants SNF. Pt not oriented      Expected Discharge Plan and Services Expected Discharge Plan: Skilled Nursing Facility                                              Prior Living Arrangements/Services              Need for Family Participation in Patient Care: Yes (Comment) Care giver support system in place?: Yes (comment)      Activities of Daily Living Home Assistive Devices/Equipment: None ADL Screening (condition at time of admission) Patient's cognitive ability adequate to safely complete daily activities?: Yes Is the patient deaf or have difficulty hearing?: No Does the patient have difficulty seeing, even when wearing glasses/contacts?: No Does the patient have difficulty concentrating, remembering, or making decisions?: Yes Patient able to express need for assistance with ADLs?: No Does  the patient have difficulty dressing or bathing?: No Independently performs ADLs?: Yes (appropriate for developmental age) Does the patient have difficulty walking or climbing stairs?: Yes Weakness of Legs: Both Weakness of Arms/Hands: None  Permission Sought/Granted                  Emotional Assessment         Alcohol / Substance Use: Alcohol Use Psych Involvement: No (comment)  Admission diagnosis:  Respiratory distress [R06.03] Acute on chronic combined systolic (congestive) and diastolic (congestive) heart failure (HCC) [I50.43] HCAP (healthcare-associated pneumonia) [J18.9] Acute on chronic combined systolic and diastolic CHF (congestive heart failure) (HCC) [I50.43] COVID-19 [U07.1] Patient Active Problem List   Diagnosis Date Noted  . Acute on chronic combined systolic and diastolic CHF (congestive heart failure) (HCC) 01/12/2021  . Alcohol use 01/12/2021  . 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 Acute hypoxemic respiratory failure due to COVID-19 (HCC) 12/24/2020  . Pulmonary embolism (HCC) 12/24/2020  . Tobacco use disorder   . Hypertension   . GERD (gastroesophageal reflux disease)   . Acute kidney failure, unspecified (HCC)   . 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 Empyema lung (HCC) 04/30/2015  . Loculated pleural effusion   . Hyperglycemia   .  Right lower lobe lung mass   . Lung mass 04/24/2015  . Lethargy   . Essential hypertension   . HLD (hyperlipidemia)   . Right-sided chest pain 04/20/2015  . Transaminitis 04/20/2015  . Positive D dimer 04/20/2015  . Acute respiratory failure with hypoxia (HCC) 04/20/2015  . Pleural effusion, right 04/20/2015  . Hypokalemia 04/20/2015  . Dementia without behavioral disturbance (HCC) 09/02/2014  . Type 2 diabetes mellitus with diabetic neuropathy (HCC) 05/20/2014  . Gout 04/29/2014  . Elevated liver enzymes 01/27/2014  . Anemia 05/02/2012   . Constipation 04/24/2012  . Dementia (HCC) 04/24/2012  . Arthritis   . HTN (hypertension) 04/13/2012  . Hyperlipidemia 04/13/2012   PCP:  Harvest Forest, MD Pharmacy:   [facility] [phone] [address], [place] - [phone] AT [facility] [address] [place] [address] Phone: [phone] Fax: [phone]  [facility] [place], [place] - [address] [place] [address] Phone: [phone] Fax: [phone]     Social Determinants of Health (SDOH) Interventions Food Insecurity Interventions: Intervention Not Indicated Financial Strain Interventions: Intervention Not Indicated Housing Interventions: Intervention Not Indicated Transportation Interventions: Intervention Not Indicated Alcohol Brief Interventions/Follow-up: AUDIT Score <7 follow-up not indicated  Readmission Risk Interventions Readmission Risk Prevention Plan 12/27/2020  Transportation Screening Complete  PCP or Specialist Appt within 5-7 Days Complete  Home Care Screening Complete  Medication Review (RN CM) Referral to Pharmacy  Some recent data might be hidden

## 2021-01-14 NOTE — Plan of Care (Signed)

## 2021-01-14 NOTE — Progress Notes (Signed)
Heart Failure Stewardship Pharmacist Progress Note   PCP: Harvest Forest, MD PCP-Cardiologist: No primary care provider on file.    HPI:  80 yo M with PMH of dementia, HTN, T2DM, HFrEF, and heavy alcohol use. He presented back to the ED on 01/11/21 and was admitted for acute respiratory failure following a recent hospitalization from 12/24/20-12/30/20 with COVID-19 PNA and PE. An ECHO wad done on 01/12/21 and LVEF is 20-25%.  Current HF Medications: Metoprolol XL 50 mg daily Losartan 100 mg daily  Prior to admission HF Medications: Metoprolol XL 50 mg daily Losartan 100 mg daily  Pertinent Lab Values: . Serum creatinine 1.84, BUN 32, Potassium 2.9, Sodium 141, BNP 697.7, Magnesium 2.0   Vital Signs: . Weight: 168 lbs (admission weight: 180 lbs) . Blood pressure: 100/70s  . Heart rate: 90s   Medication Assistance / Insurance Benefits Check: Does the patient have prescription insurance?  Yes Type of insurance plan: UHC Medicare  Does the patient qualify for medication assistance through manufacturers or grants? Pending . Eligible grants and/or patient assistance programs: pending . Medication assistance applications in progress: none  . Medication assistance applications approved: none Approved medication assistance renewals will be completed by: pending  Outpatient Pharmacy:  Prior to admission outpatient pharmacy: [facility] Is the patient willing to use [facility] TOC pharmacy at discharge? Yes Is the patient willing to transition their outpatient pharmacy to utilize a [facility] outpatient pharmacy?   Pending    Assessment: 1. Acute on chronic systolic CHF (EF 76-16%), due to unknown etiology. NYHA class III symptoms. SCr bump from 1.33 to 1.84 today. - Holding diuretics with SCr bump overnight - Continue metoprolol XL 50 mg daily - On losartan 100 mg daily. Consider optimizing to [facility] prior to discharge - Consider starting spironolactone and Farxiga prior to discharge  (pending renal improvement) - If renal function does not improve prior to discharge, could use hydral/nitrate to optimize GDMT   Plan: 1) Medication changes recommended at this time: - Continue current regimen; pending renal improvement  2) Patient assistance application(s): - None pending  3)  Education  - To be completed prior to discharge  Sharen Hones, PharmD, BCPS Heart Failure Stewardship Pharmacist Phone [phone]

## 2021-01-14 NOTE — Progress Notes (Addendum)
Pharmacy Antibiotic Note  Gary Mcneil is a 80 y.o. male admitted on 01/11/2021 with pneumonia.  Pharmacy has been consulted for Vancomycin and Zosyn dosing. WBC 10.8, afebrile, Scr 0.93, LA 1.7.   Pt has been on abx since 2/23 PM. MRSA came back neg. Ok to [employer] and transition zosyn to Augmentin to complete a total of 5d of abx per Dr. Natale Milch. Pt has a PCN allergy listed but has tolerated Zosyn without issue.   CrCl~36 ml/min  Plan: Dc vanc/zosyn Augmentin 875mg  PO BID x4d   Height: 6' (182.9 cm) Weight: 76.3 kg (168 lb 3.4 oz) IBW/kg (Calculated) : 77.6  Temp (24hrs), Avg:98.4 F (36.9 C), Min:97.7 F (36.5 C), Max:99.2 F (37.3 C)  Recent Labs  Lab 01/11/21 2020 01/11/21 2352 01/13/21 0341 01/14/21 0249  WBC 10.8*  --  13.2* 12.3*  CREATININE 0.93  --  1.33* 1.84*  LATICACIDVEN  --  1.7  --   --     Estimated Creatinine Clearance: 36.3 mL/min (A) (by C-G formula based on SCr of 1.84 mg/dL (H)).    Allergies  Allergen Reactions  . Penicillins Rash    Antimicrobials this admission: Vancomycin 2/23 >> 2/25 Zosyn 2/23 >> 2/25  Dose adjustments this admission: N/A  Microbiology results: 2/23 BCx: ngtd 2/23 MRSA PCR: neg  3/23, PharmD, BCIDP, AAHIVP, CPP Infectious Disease Pharmacist 01/14/2021 10:11 AM

## 2021-01-14 NOTE — NC FL2 (Signed)
[place] MEDICAID FL2 LEVEL OF CARE SCREENING TOOL     IDENTIFICATION  Patient Name: Gary Mcneil Birthdate: [birth year] Sex: male Admission Date (Current Location): 01/11/2021  [name] and [place] Number:  Producer, television/film/video and Address:  [facility] [place], [address], [place], [place] [address]      Provider Number: [id number]  Attending Physician Name and Address:  Azucena Fallen, MD  Relative Name and Phone Number:  Zeric, [facility])   [phone] (Mobile    Current Level of Care: Hospital Recommended Level of Care: Skilled Nursing Facility Prior Approval Number: [id number]  Date Approved/Denied:   PASRR Number: [id number]  Discharge Plan: SNF    Current Diagnoses: Patient Active Problem List   Diagnosis Date Noted  . Acute on chronic combined systolic and diastolic CHF (congestive heart failure) (HCC) 01/12/2021  . Alcohol use 01/12/2021  . 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 Acute hypoxemic respiratory failure due to COVID-19 (HCC) 12/24/2020  . Pulmonary embolism (HCC) 12/24/2020  . Tobacco use disorder   . Hypertension   . GERD (gastroesophageal reflux disease)   . Acute kidney failure, unspecified (HCC)   . 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 Empyema lung (HCC) 04/30/2015  . Loculated pleural effusion   . Hyperglycemia   . Right lower lobe lung mass   . Lung mass 04/24/2015  . Lethargy   . Essential hypertension   . HLD (hyperlipidemia)   . Right-sided chest pain 04/20/2015  . Transaminitis 04/20/2015  . Positive D dimer 04/20/2015  . Acute respiratory failure with hypoxia (HCC) 04/20/2015  . Pleural effusion, right 04/20/2015  . Hypokalemia 04/20/2015  . Dementia without behavioral disturbance (HCC) 09/02/2014  . Type 2 diabetes mellitus with diabetic neuropathy (HCC) 05/20/2014  . Gout 04/29/2014  . Elevated liver enzymes 01/27/2014   . Anemia 05/02/2012  . Constipation 04/24/2012  . Dementia (HCC) 04/24/2012  . Arthritis   . HTN (hypertension) 04/13/2012  . Hyperlipidemia 04/13/2012    Orientation RESPIRATION BLADDER Height & Weight     Self  O2 (4L [place]) Incontinent,External catheter Weight: 168 lb 3.4 oz (76.3 kg) Height:  6' (182.9 cm)  BEHAVIORAL SYMPTOMS/MOOD NEUROLOGICAL BOWEL NUTRITION STATUS  Other (Comment) (Poor safety awareness)   Continent Diet (see d/c summary)  AMBULATORY STATUS COMMUNICATION OF NEEDS Skin   Extensive Assist Verbally Normal                       Personal Care Assistance Level of Assistance  Bathing,Feeding,Dressing Bathing Assistance: Maximum assistance Feeding assistance: Independent Dressing Assistance: Limited assistance     Functional Limitations Info  Sight,Hearing,Speech Sight Info: Impaired Hearing Info: Adequate Speech Info: Adequate    SPECIAL CARE FACTORS FREQUENCY  PT (By licensed PT),OT (By licensed OT)     PT Frequency: 5x/week OT Frequency: 5x/week            Contractures Contractures Info: Not present    Additional Factors Info  Code Status,Allergies Code Status Info: FULL Allergies Info: Penicillins           Current Medications (01/14/2021):  This is the current hospital active medication list Current Facility-Administered Medications  Medication Dose Route Frequency Provider Last Rate Last Admin  . 0.9 %  sodium chloride infusion  250 mL Intravenous PRN Hillary Bow, DO      . acetaminophen (TYLENOL) tablet 650 mg  650 mg Oral Q4H PRN  Hillary Bow, DO      . albuterol (VENTOLIN HFA) 108 (90 Base) MCG/ACT inhaler 2 puff  2 puff Inhalation Q6H PRN Hillary Bow, DO      . allopurinol (ZYLOPRIM) tablet 300 mg  300 mg Oral Daily Lyda Perone M, DO   300 mg at 01/14/21 2094  . amoxicillin-clavulanate (AUGMENTIN) 875-125 MG per tablet 1 tablet  1 tablet Oral Q12H Pham, Minh Q, RPH-CPP   1 tablet at 01/14/21 1146  . apixaban  (ELIQUIS) tablet 5 mg  5 mg Oral BID Hillary Bow, DO   5 mg at 01/14/21 7096  . atorvastatin (LIPITOR) tablet 40 mg  40 mg Oral QHS Lyda Perone M, DO   40 mg at 01/13/21 2138  . benzonatate (TESSALON) capsule 100 mg  100 mg Oral TID PRN Hillary Bow, DO   100 mg at 01/12/21 2037  . memantine (NAMENDA XR) 24 hr capsule 28 mg  28 mg Oral Daily Azucena Fallen, MD   28 mg at 01/13/21 2137   And  . donepezil (ARICEPT) tablet 10 mg  10 mg Oral QHS Azucena Fallen, MD   10 mg at 01/13/21 2137  . folic acid (FOLVITE) tablet 1 mg  1 mg Oral Daily Lyda Perone M, DO   1 mg at 01/14/21 2836  . insulin aspart (novoLOG) injection 0-15 Units  0-15 Units Subcutaneous TID WC Hillary Bow, DO   3 Units at 01/14/21 1146  . insulin aspart (novoLOG) injection 0-5 Units  0-5 Units Subcutaneous QHS Hillary Bow, DO      . LORazepam (ATIVAN) tablet 1-4 mg  1-4 mg Oral Q1H PRN Hillary Bow, DO   1 mg at 01/13/21 2137   Or  . LORazepam (ATIVAN) injection 1-4 mg  1-4 mg Intravenous Q1H PRN Hillary Bow, DO      . losartan (COZAAR) tablet 100 mg  100 mg Oral Daily Lyda Perone M, DO   100 mg at 01/14/21 0843  . metoprolol succinate (TOPROL-XL) 24 hr tablet 50 mg  50 mg Oral Daily Lyda Perone M, DO   50 mg at 01/14/21 0843  . multivitamin with minerals tablet 1 tablet  1 tablet Oral Daily Hillary Bow, DO   1 tablet at 01/14/21 [id number]  . nitroGLYCERIN (NITROSTAT) SL tablet 0.4 mg  0.4 mg Sublingual Q5 min PRN Hillary Bow, DO   0.4 mg at 01/12/21 0702  . ondansetron (ZOFRAN) injection 4 mg  4 mg Intravenous Q6H PRN Hillary Bow, DO      . pantoprazole (PROTONIX) EC tablet 40 mg  40 mg Oral Q1200 Hillary Bow, DO   40 mg at 01/14/21 1143  . potassium chloride SA (KLOR-CON) CR tablet 40 mEq  40 mEq Oral Q4H Azucena Fallen, MD   40 mEq at 01/14/21 1142  . sodium chloride flush (NS) 0.9 % injection 3 mL  3 mL Intravenous Q12H Lyda Perone M, DO   3 mL at  01/14/21 0844  . sodium chloride flush (NS) 0.9 % injection 3 mL  3 mL Intravenous PRN Hillary Bow, DO      . thiamine tablet 100 mg  100 mg Oral Daily Lyda Perone M, DO   100 mg at 01/14/21 7654     Discharge Medications: Please see discharge summary for a list of discharge medications.  Relevant Imaging Results:  Relevant Lab Results:   Additional Information SSN [id number]  Pt was covid positive on 12/24/20. He is 21 days post covid at this time  [employer], LCSW

## 2021-01-14 NOTE — Progress Notes (Signed)
Scr went up to 1.84 today. MRSA PCR came back neg. We will hold vanc for now while waiting to see if it can be dced. Messaged MD.  Ulyses Southward, PharmD, BCIDP, AAHIVP, CPP Infectious Disease Pharmacist 01/14/2021 9:19 AM

## 2021-01-14 NOTE — Evaluation (Addendum)
Physical Therapy Evaluation Patient Details Name: Gary Mcneil MRN: [id number] DOB: 1941-09-27 Today's Date: 01/14/2021   History of Present Illness  Gary Mcneil is a 80 y.o. male admitted following family finding him on the floor unconscious and hypoxic  into the 60s on their arrival per report. In ED: CT head and neck is neg. CTA chest: Poor contrast timing, lungs show increasingly confluent opacities compared to earlier this month. Pt with medical history significant of dementia, HTN, DM2, HFrEF with EF 30-35% as of earlier this month, heavy EtOH use / abuse. Pt recently admitted for acute hypoxic resp failure due to COVID-19 PNA and pulmonary embolism on 2/4.  Also noted to have acute CHF during that admit treated with dose of lasix.  Pt discharged home on O2 with ambulation on 2/10.   Clinical Impression  Pt readmitted following d/c earlier this month. Per pt. He was I with family checking in on him. He is currently incontinent requiring assist for hygiene. Pt is also requiring min A for OOB activities due to weakness and balance deficits. Pt is also demonstrating deficits in gait, endurance and safety awareness. Pt will benefit from skilled PT to address deficits to maximize independence with functional mobility prior ot discharge.     Follow Up Recommendations Home health PT vs SNF;Supervision for mobility/OOB    Equipment Recommendations  None recommended by PT    Recommendations for Other Services       Precautions / Restrictions Precautions Precautions: Other (comment);Fall Precaution Comments: incontinent Restrictions Weight Bearing Restrictions: No      Mobility  Bed Mobility Overal bed mobility: Needs Assistance Bed Mobility: Rolling;Supine to Sit Rolling: Supervision   Supine to sit: Supervision          Transfers Overall transfer level: Needs assistance Equipment used: Rolling walker (2 wheeled)   Sit to Stand: Min assist             Ambulation/Gait Ambulation/Gait assistance: Min assist Gait Distance (Feet): 6 Feet Assistive device: Rolling walker (2 wheeled)       General Gait Details: pt with decreased proximity to RW. pt incontinent during shor bout of ambulation  Stairs            Wheelchair Mobility    Modified Rankin (Stroke Patients Only)       Balance Overall balance assessment: Needs assistance Sitting-balance support: No upper extremity supported;Feet supported Sitting balance-Leahy Scale: Fair     Standing balance support: Bilateral upper extremity supported Standing balance-Leahy Scale: Poor                               Pertinent Vitals/Pain Pain Assessment: No/denies pain    Home Living Family/patient expects to be discharged to:: Private residence Living Arrangements: Alone Available Help at Discharge: Family;Available PRN/intermittently Type of Home: House Home Access: Level entry     Home Layout: One level Home Equipment: Walker - 2 wheels;Cane - single point;Shower seat;Grab bars - tub/shower;Grab bars - toilet Additional Comments: reports his son and daughter in law live 4 blocks away; unsure of accuracy of home setup (pt with hx of dementia)    Prior Function Level of Independence: Independent   Gait / Transfers Assistance Needed: Pt reports typically not using AD for mobility has access to DME if he needs.  ADL's / Homemaking Assistance Needed: pt states he does everything for himself. Will need to confirm  Comments: unsure of accuracy as  pt with hx of dementia     Hand Dominance   Dominant Hand: Right    Extremity/Trunk Assessment   Upper Extremity Assessment Upper Extremity Assessment: Generalized weakness    Lower Extremity Assessment Lower Extremity Assessment: Generalized weakness       Communication      Cognition Arousal/Alertness: Awake/alert Behavior During Therapy: WFL for tasks assessed/performed Overall Cognitive Status:  No family/caregiver present to determine baseline cognitive functioning                     Current Attention Level: Sustained;Selective Memory: Decreased short-term memory Following Commands: Follows one step commands consistently Safety/Judgement: Decreased awareness of deficits;Decreased awareness of safety            General Comments General comments (skin integrity, edema, etc.): Pt fatigued very quickly following activity. increased time needed sitting EOB prior to standing and short ambulation. Following ambulation pt requiring increased time to rest; RN informed of incontience    Exercises     Assessment/Plan    PT Assessment Patient needs continued PT services  PT Problem List Decreased strength;Decreased mobility;Decreased safety awareness;Decreased coordination;Decreased activity tolerance;Decreased balance       PT Treatment Interventions Therapeutic exercise;Gait training;Balance training;Functional mobility training;Therapeutic activities;Patient/family education;Stair training    PT Goals (Current goals can be found in the Care Plan section)  Acute Rehab PT Goals Patient Stated Goal: To get better PT Goal Formulation: With patient Time For Goal Achievement: 01/27/21 Potential to Achieve Goals: Good    Frequency Min 3X/week   Barriers to discharge        Co-evaluation               AM-PAC PT "6 Clicks" Mobility  Outcome Measure Help needed turning from your back to your side while in a flat bed without using bedrails?: None Help needed moving from lying on your back to sitting on the side of a flat bed without using bedrails?: A Little Help needed moving to and from a bed to a chair (including a wheelchair)?: A Little Help needed standing up from a chair using your arms (e.g., wheelchair or bedside chair)?: A Little Help needed to walk in hospital room?: A Little Help needed climbing 3-5 steps with a railing? : A Lot 6 Click Score: 18     End of Session Equipment Utilized During Treatment: Oxygen;Gait belt Activity Tolerance: Patient tolerated treatment well;Patient limited by fatigue Patient left: in chair;with call bell/phone within reach;with chair alarm set Nurse Communication: Mobility status PT Visit Diagnosis: Unsteadiness on feet (R26.81);Muscle weakness (generalized) (M62.81);Other abnormalities of gait and mobility (R26.89)    Time: 1020-1058 PT Time Calculation (min) (ACUTE ONLY): 38 min   Charges:   PT Evaluation $PT Eval Low Complexity: 1 Low PT Treatments $Therapeutic Activity: 23-37 mins        Ginette Otto, DPT Acute Rehabilitation Services [phone]  Lucretia Field 01/14/2021, 12:06 PM

## 2021-01-15 DIAGNOSIS — J9601 Acute respiratory failure with hypoxia: Secondary | ICD-10-CM | POA: Diagnosis not present

## 2021-01-15 LAB — GLUCOSE, CAPILLARY
Glucose-Capillary: 85 mg/dL (ref 70–99)
Glucose-Capillary: 131 mg/dL — ABNORMAL HIGH (ref 70–99)
Glucose-Capillary: 91 mg/dL (ref 70–99)
Glucose-Capillary: 130 mg/dL — ABNORMAL HIGH (ref 70–99)

## 2021-01-15 LAB — BASIC METABOLIC PANEL
Anion gap: 10 (ref 5–15)
BUN: 45 mg/dL — ABNORMAL HIGH (ref 8–23)
CO2: 22 mmol/L (ref 22–32)
Calcium: 8.2 mg/dL — ABNORMAL LOW (ref 8.9–10.3)
Chloride: 106 mmol/L (ref 98–111)
Creatinine, Ser: 1.91 mg/dL — ABNORMAL HIGH (ref 0.61–1.24)
GFR, Estimated: 36 mL/min — ABNORMAL LOW (ref >60)
Glucose, Bld: 129 mg/dL — ABNORMAL HIGH (ref 70–99)
Potassium: 3.2 mmol/L — ABNORMAL LOW (ref 3.5–5.1)
Sodium: 138 mmol/L (ref 135–145)

## 2021-01-15 LAB — CBC
HCT: 31 % — ABNORMAL LOW (ref 39.0–52.0)
Hemoglobin: 10.9 g/dL — ABNORMAL LOW (ref 13.0–17.0)
MCH: 29.3 pg (ref 26.0–34.0)
MCHC: 35.2 g/dL (ref 30.0–36.0)
MCV: 83.3 fL (ref 80.0–100.0)
Platelets: 114 10*3/uL — ABNORMAL LOW (ref 150–400)
RBC: 3.72 MIL/uL — ABNORMAL LOW (ref 4.22–5.81)
RDW: 15.3 % (ref 11.5–15.5)
WBC: 8.2 10*3/uL (ref 4.0–10.5)
nRBC: 0 % (ref 0.0–0.2)

## 2021-01-15 MED ORDER — LIDOCAINE HCL URETHRAL/MUCOSAL 2 % EX GEL
1.0000 "application " | Freq: Once | CUTANEOUS | Status: AC
  Administered 2021-01-15: 1 via URETHRAL
  Filled 2021-01-15: qty 11

## 2021-01-15 MED ORDER — CHLORHEXIDINE GLUCONATE CLOTH 2 % EX PADS
6.0000 | MEDICATED_PAD | Freq: Every day | CUTANEOUS | Status: DC
  Administered 2021-01-15 – 2021-01-18 (×4): 6 via TOPICAL

## 2021-01-15 NOTE — Plan of Care (Signed)
°  Problem: Clinical Measurements: Goal: Respiratory complications will improve Outcome: Progressing   Problem: Activity: Goal: Risk for activity intolerance will decrease Outcome: Progressing   

## 2021-01-15 NOTE — Plan of Care (Signed)
  Problem: Nutrition: Goal: Adequate nutrition will be maintained Outcome: Progressing   Problem: Activity: Goal: Risk for activity intolerance will decrease Outcome: Progressing   

## 2021-01-15 NOTE — TOC Progression Note (Signed)
Transition of Care [facility]) - Progression Note    Patient Details  Name: Gary Mcneil MRN: [id number] Date of Birth: November 11, 1941  [facility] CM/SW Contact  Patrice [place], [place] Phone Number: [phone] 01/15/2021, 12:22 PM  Clinical Narrative:     Pt currently has no bed offers.  TOC team will continue to assist with discharge planning needs.   Expected Discharge Plan: Skilled Nursing Facility    Expected Discharge Plan and Services Expected Discharge Plan: Skilled Nursing Facility                                               Social Determinants of Health (SDOH) Interventions Food Insecurity Interventions: Intervention Not Indicated Financial Strain Interventions: Intervention Not Indicated Housing Interventions: Intervention Not Indicated Transportation Interventions: Intervention Not Indicated Alcohol Brief Interventions/Follow-up: AUDIT Score <7 follow-up not indicated  Readmission Risk Interventions Readmission Risk Prevention Plan 12/27/2020  Transportation Screening Complete  PCP or Specialist Appt within 5-7 Days Complete  Home Care Screening Complete  Medication Review (RN CM) Referral to Pharmacy  Some recent data might be hidden

## 2021-01-15 NOTE — Progress Notes (Signed)
PROGRESS NOTE    Gary Mcneil  FKC:[id number] DOB: Mar 14, 1941 DOA: 01/11/2021 PCP: Har[place], MD   Brief Narrative:  Gary Mcneil is a 80 y.o. 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 Pt recently admitted for acute hypoxic resp failure due to COVID-19 PNA and pulmonary embolism on 2/4.  Also noted to have acute CHF during that admit treated with dose of lasix.  Pt discharged home on O2 with ambulation on 2/10. Pt lives alone with family checking in on him.  They checked on him at noon and he was fine.  They checked back in on him at 4pm they found him down on the floor and he appeared to be unconscious with hypoxia into the 60s on their arrival per report. In ED: CT head and neck is neg. CTA chest: Poor contrast timing, lungs show increasingly confluent opacities compared to earlier this month.  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) and diastolic (congestive) heart failure (HCC)   Acute on chronic resp failure with hypoxia resolving quickly Likely heart failure exacerbation Cannot rule out aspiration or HCAP pneumonia - Hold IV diuretics -creatinine bump overnight - Hypoxia resolving quickly with diuretics consistent with heart failure exacerbation, would not expect patient to resolve so quickly after 1-2 doses of antibiotics - Negative procalcitonin, continue antibiotics given symptoms concern for aspiration/pneumonitis and high risk for infection: Vancomycin/Zosyn discontinued, de-escalating to  Augmentin, MRSA negative - Repeat echo - 20-25% with severely decreased function of the left ventricle and global hypokinesis  Mild AKI without history of CKD the setting of diuretics  - Holding IV Lasix as above  R knee fluctuance -Improving with diuresis -Plain film shows no acute process, likely enlarged bursa -does not appear to affect patient's ambulatory status.  HTN, essential Continue home medications  History of PE, POA Continue eliquis  Chronic alcohol use    CIWA protocol ongoing, not scoring  DM2 Hold home PO meds Mod scale SSI AC/HS  Dementia Continue home memantine - donepezil  DVT prophylaxis: Eliquis Code Status: Full Family Communication: Son updated  Status is: Inpatient  Dispo: The patient is from: Home              Anticipated d/c is to: SNF              Anticipated d/c date is: 24 to 48 hours              Patient currently IS medically stable for discharge - awaiting placement  Consultants:   None  Procedures:   None  Antimicrobials:  Zosyn, vancomycin -discontinued 01/14/2021 Augmentin -   Subjective: No acute issues or events overnight, patient's mental status continues to improve. Patient denies overt symptoms of chest pain, nausea, vomiting, diarrhea, constipation, headache, fevers, chills.  Objective: Vitals:   01/14/21 1938 01/14/21 2343 01/15/21 0111 01/15/21 0352  BP: 128/81 (!) 83/59 105/62 100/66  Pulse: 94 98 93 94  Resp: 18 14  16   Temp: (!) 97.3 F (36.3 C) 97.8 F (36.6 C)  98.2 F (36.8 C)  TempSrc: Oral Oral  Oral  SpO2: 96% 97%  93%  Weight:      Height:        Intake/Output Summary (Last 24 hours) at 01/15/2021 0730 Last data filed at 01/15/2021 0631 Gross per 24 hour  Intake 1769 ml  Output 1275 ml  Net 494 ml   Filed Weights   01/11/21 1846 01/13/21 0030 01/14/21 0113  Weight: 82 kg 75.1 kg 76.3 kg    Examination:  General:  Pleasantly resting in bed, No acute distress. HEENT:  Normocephalic  atraumatic.  Sclerae nonicteric, noninjected.  Extraocular movements intact bilaterally. Neck:  Without mass or deformity.  Trachea is midline. Lungs: Scant bibasilar rales without overt wheeze or rhonchi. Heart:  Regular rate and rhythm.  Without murmurs, rubs, or gallops. Abdomen:  Soft, nontender, nondistended.  Without guarding or rebound. Extremities: Without cyanosis, clubbing, edema, or obvious deformity. Minimal fluctuance superior and medial to R knee. Vascular:  Dorsalis pedis and posterior tibial pulses palpable bilaterally. Skin:  Warm and dry, no erythema, no ulcerations.  Data Reviewed: I have personally reviewed following labs and imaging studies  CBC: Recent Labs  Lab 01/11/21 2020 01/12/21 0717 01/13/21 0341 01/14/21 0249 01/15/21 0247  WBC 10.8*  --  13.2* 12.3* 8.2  NEUTROABS 8.5*  --   --   --   --   HGB 13.5 12.6* 14.5 12.8* 10.9*  HCT 37.0* 37.0* 40.1 36.8* 31.0*  MCV 85.8  --  83.2 83.6 83.3  PLT 137*  --  150 126* 114*   Basic Metabolic Panel: Recent Labs  Lab 01/11/21 2020 01/12/21 0300 01/12/21 0717 01/13/21 0341 01/14/21 0249 01/15/21 0247  NA 139  --  138 142 141 138  K 4.0  --  3.5 3.7 2.9* 3.2*  CL 106  --   --  104 105 106  CO2 23  --   --  23 23 22   GLUCOSE 126*  --   --  134* 143* 129*  BUN 18  --   --  17 32* 45*  CREATININE 0.93  --   --  1.33* 1.84* 1.91*  CALCIUM 8.5*  --   --  9.0 8.4* 8.2*  MG  --  2.0  --   --   --   --   PHOS  --  2.8  --   --   --   --    GFR: Estimated Creatinine Clearance: 35 mL/min (A) (by C-G formula based on SCr of 1.91 mg/dL (H)). Liver Function Tests: Recent Labs  Lab 01/11/21 2020  AST 32  ALT 25  ALKPHOS 66  BILITOT 1.1  PROT 5.8*  ALBUMIN 2.7*   No results for input(s): LIPASE, AMYLASE in the last 168 hours. No results for input(s): AMMONIA in the last 168 hours. Coagulation Profile: No results for input(s): INR, PROTIME in the last 168 hours. Cardiac Enzymes: No results for input(s):  CKTOTAL, CKMB, CKMBINDEX, TROPONINI in the last 168 hours. BNP (last 3 results) No results for input(s): PROBNP in the last 8760 hours. HbA1C: No results for input(s): HGBA1C in the last 72 hours. CBG: Recent Labs  Lab 01/14/21 0616 01/14/21 1117 01/14/21 1547 01/14/21 2115 01/15/21 0519  GLUCAP 125* 160* 111* 114* 85   Lipid Profile: No results for input(s): CHOL, HDL, LDLCALC, TRIG, CHOLHDL, LDLDIRECT in the last 72 hours. Thyroid Function Tests: No results for input(s): TSH, T4TOTAL, FREET4, T3FREE, THYROIDAB in the last 72 hours. Anemia  Panel: No results for input(s): VITAMINB12, FOLATE, FERRITIN, TIBC, IRON, RETICCTPCT in the last 72 hours. Sepsis Labs: Recent Labs  Lab 01/11/21 2052 01/11/21 2352  PROCALCITON <0.10  --   LATICACIDVEN  --  1.7    Recent Results (from the past 240 hour(s))  Blood culture (routine x 2)     Status: None (Preliminary result)   Collection Time: 01/11/21 11:36 PM   Specimen: BLOOD RIGHT HAND  Result Value Ref Range Status   Specimen Description BLOOD RIGHT HAND  Final   Special Requests   Final    BOTTLES DRAWN AEROBIC AND ANAEROBIC Blood Culture results may not be optimal due to an inadequate volume of blood received in culture bottles   Culture   Final    NO GROWTH 2 DAYS Performed at [facility], [address], [place], [place] [address]    Report Status PENDING  Incomplete  Blood culture (routine x 2)     Status: None (Preliminary result)   Collection Time: 01/12/21  4:44 AM   Specimen: BLOOD  Result Value Ref Range Status   Specimen Description BLOOD SITE NOT SPECIFIED  Final   Special Requests   Final    AEROBIC BOTTLE ONLY Blood Culture results may not be optimal due to an inadequate volume of blood received in culture bottles   Culture   Final    NO GROWTH 2 DAYS Performed at [facility], [address], [place], [place] [address]    Report Status PENDING  Incomplete  MRSA PCR Screening     Status: None    Collection Time: 01/13/21  6:32 PM   Specimen: Nasopharyngeal  Result Value Ref Range Status   MRSA by PCR NEGATIVE NEGATIVE Final    Comment:        The GeneXpert MRSA Assay (FDA approved for NASAL specimens only), is one component of a comprehensive MRSA colonization surveillance program. It is not intended to diagnose MRSA infection nor to guide or monitor treatment for MRSA infections. Performed at [facility], [address] [place], [place] [address]      Radiology Studies: DG Knee 1-2 Views Right  Result Date: 01/14/2021 CLINICAL DATA:  Knee pain with significant swelling EXAM: RIGHT KNEE - 1-2 VIEW COMPARISON:  08/11/2008 FINDINGS: No fracture or malalignment. Mild patellofemoral and lateral joint space degenerative change. Faint joint space calcifications. Generalized soft tissue swelling with small moderate suprapatellar joint effusion. Vascular calcifications. IMPRESSION: 1. No acute osseous abnormality. 2. Generalized soft tissue swelling with small to moderate suprapatellar joint effusion. Electronically Signed   By: Jasmine Pang M.D.   On: 01/14/2021 19:20   Scheduled Meds: . allopurinol  300 mg Oral Daily  . amoxicillin-clavulanate  1 tablet Oral Q12H  . apixaban  5 mg Oral BID  . atorvastatin  40 mg Oral QHS  . memantine  28 mg Oral Daily   And  . donepezil  10 mg Oral QHS  . folic acid  1 mg Oral Daily  . insulin aspart  0-15 Units Subcutaneous TID WC  . insulin aspart  0-5 Units Subcutaneous QHS  . losartan  100 mg Oral Daily  . metoprolol succinate  50 mg Oral Daily  . multivitamin with minerals  1 tablet Oral Daily  . pantoprazole  40 mg Oral Q1200  . sodium chloride flush  3 mL Intravenous Q12H  . thiamine  100 mg Oral Daily   Continuous Infusions: . sodium chloride  LOS: 3 days   Time spent:  Azucena Fallen, DO [facility]  If 7PM-7AM, please contact night-coverage [email or web address]  01/15/2021, 7:30 AM

## 2021-01-16 DIAGNOSIS — J9601 Acute respiratory failure with hypoxia: Secondary | ICD-10-CM | POA: Diagnosis not present

## 2021-01-16 LAB — GLUCOSE, CAPILLARY
Glucose-Capillary: 144 mg/dL — ABNORMAL HIGH (ref 70–99)
Glucose-Capillary: 139 mg/dL — ABNORMAL HIGH (ref 70–99)
Glucose-Capillary: 104 mg/dL — ABNORMAL HIGH (ref 70–99)

## 2021-01-16 LAB — BASIC METABOLIC PANEL
Anion gap: 10 (ref 5–15)
BUN: 22 mg/dL (ref 8–23)
CO2: 25 mmol/L (ref 22–32)
Calcium: 8.4 mg/dL — ABNORMAL LOW (ref 8.9–10.3)
Chloride: 108 mmol/L (ref 98–111)
Creatinine, Ser: 1.16 mg/dL (ref 0.61–1.24)
GFR, Estimated: >60 mL/min (ref >60)
Glucose, Bld: 131 mg/dL — ABNORMAL HIGH (ref 70–99)
Potassium: 3.6 mmol/L (ref 3.5–5.1)
Sodium: 143 mmol/L (ref 135–145)

## 2021-01-16 LAB — CBC
HCT: 33.6 % — ABNORMAL LOW (ref 39.0–52.0)
Hemoglobin: 11.7 g/dL — ABNORMAL LOW (ref 13.0–17.0)
MCH: 29.1 pg (ref 26.0–34.0)
MCHC: 34.8 g/dL (ref 30.0–36.0)
MCV: 83.6 fL (ref 80.0–100.0)
Platelets: 120 10*3/uL — ABNORMAL LOW (ref 150–400)
RBC: 4.02 MIL/uL — ABNORMAL LOW (ref 4.22–5.81)
RDW: 15.2 % (ref 11.5–15.5)
WBC: 7.4 10*3/uL (ref 4.0–10.5)
nRBC: 0 % (ref 0.0–0.2)

## 2021-01-16 MED ORDER — DIAZEPAM 2 MG PO TABS
2.0000 mg | ORAL_TABLET | Freq: Two times a day (BID) | ORAL | Status: DC | PRN
  Administered 2021-01-16 – 2021-01-17 (×3): 2 mg via ORAL
  Filled 2021-01-16 (×3): qty 1

## 2021-01-16 MED ORDER — SODIUM CHLORIDE 0.9 % IR SOLN
3000.0000 mL | Status: DC
  Administered 2021-01-16: 3000 mL

## 2021-01-16 MED ORDER — FUROSEMIDE 40 MG PO TABS
40.0000 mg | ORAL_TABLET | Freq: Every day | ORAL | Status: DC
  Administered 2021-01-16 – 2021-01-18 (×3): 40 mg via ORAL
  Filled 2021-01-16 (×3): qty 1

## 2021-01-16 MED ORDER — OXYBUTYNIN CHLORIDE 5 MG PO TABS
5.0000 mg | ORAL_TABLET | Freq: Two times a day (BID) | ORAL | Status: DC
  Administered 2021-01-16 – 2021-01-18 (×6): 5 mg via ORAL
  Filled 2021-01-16 (×6): qty 1

## 2021-01-16 MED ORDER — FUROSEMIDE 40 MG PO TABS: 40.0000 mg | ORAL_TABLET | Freq: Two times a day (BID) | ORAL | Status: DC

## 2021-01-16 NOTE — Plan of Care (Signed)
?  Problem: Elimination: ?Goal: Will not experience complications related to urinary retention ?Outcome: Progressing ?  ?

## 2021-01-16 NOTE — Progress Notes (Addendum)
PROGRESS NOTE    Gary Mcneil  UKG:[id number] DOB: 02/04/1941 DOA: 01/11/2021 PCP: [place], MD   Brief Narrative:  Gary Mcneil is a 80 y.o. 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 Pt recently admitted for acute hypoxic resp failure due to COVID-19 PNA and pulmonary embolism on 2/4.  Also noted to have acute CHF during that admit treated with dose of lasix.  Pt discharged home on O2 with ambulation on 2/10. Pt lives alone with family checking in on him.  They checked on him at noon and he was fine.  They checked back in on him at 4pm they found him down on the floor and he appeared to be unconscious with hypoxia into the 60s on their arrival per report. In ED: CT head and neck is neg. CTA chest: Poor contrast timing, lungs show increasingly confluent opacities compared to earlier this month.  Patient admitted for acute hypoxic respiratory failure in the setting of heart failure exacerbation with questionable underlying pneumonia.  Patient diuresed quite well, continues on antibiotics through 01/17/2021.  Patient currently being evaluated by PT recommending SNF due to somewhat profound ambulatory dysfunction in the setting of acute hypoxia and heart failure exacerbation.  At this time patient medically stable for discharge awaiting SNF placement and approval.  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 Lasix 40 daily - Hypoxia resolving quickly with diuretics consistent with heart failure exacerbation, would not expect patient to resolve so quickly after 1-2 doses of antibiotics - Negative procalcitonin, continue antibiotics given symptoms concern for aspiration/pneumonitis and high risk for infection: Vancomycin/Zosyn discontinued, de-escalating to Augmentin, MRSA negative - Repeat echo - 20-25% with severely decreased function of the left ventricle and global hypokinesis - Off COVID precautions - completed quarantine already  Mild AKI without history of CKD the setting of diuretics  Concurrent urinary obstruction - Issues with coude catheter - will have urology evaluate given clotting issues - Resume p.o. Lasix 40 daily  R knee fluctuance -Improving with diuresis -Plain film shows no acute process, likely enlarged bursa -does not appear to affect patient's ambulatory status.  HTN, essential Continue home medications  History of PE, POA Continue eliquis  Chronic alcohol use    CIWA protocol ongoing, not scoring  DM2 Hold home PO meds Mod scale SSI AC/HS  Dementia Continue home memantine - donepezil  DVT prophylaxis: Eliquis Code Status: Full Family Communication: Son updated  Status is: Inpatient  Dispo: The patient is from: Home              Anticipated d/c is to: SNF              Anticipated d/c date is: 24 to 48 hours              Patient currently IS medically stable for discharge -  awaiting placement  Consultants:   None  Procedures:   None  Antimicrobials:  Zosyn, vancomycin -discontinued 01/14/2021 Augmentin -   Subjective: No acute issues or events overnight, patient's mental status continues to improve. Patient denies overt symptoms of chest pain, nausea, vomiting, diarrhea, constipation, headache, fevers,  chills.  Objective: Vitals:   01/15/21 1620 01/15/21 1930 01/16/21 0011 01/16/21 0355  BP: (!) 111/91 107/62 (!) 99/55 112/77  Pulse: 82 92 92 88  Resp: 16 17 17 18   Temp: 97.8 F (36.6 C) 98.1 F (36.7 C) 98.7 F (37.1 C) 98.1 F (36.7 C)  TempSrc: Oral Oral Oral Oral  SpO2: 95% 96% 97% 99%  Weight:   76.3 kg   Height:        Intake/Output Summary (Last 24 hours) at 01/16/2021 0720 Last data filed at 01/16/2021 0355 Gross per 24 hour  Intake 1367 ml  Output 1875 ml  Net -508 ml   Filed Weights   01/13/21 0030 01/14/21 0113 01/16/21 0011  Weight: 75.1 kg 76.3 kg 76.3 kg    Examination:  General:  Pleasantly resting in bed, No acute distress. HEENT:  Normocephalic atraumatic.  Sclerae nonicteric, noninjected.  Extraocular movements intact bilaterally. Neck:  Without mass or deformity.  Trachea is midline. Lungs: Scant bibasilar rales without overt wheeze or rhonchi. Heart:  Regular rate and rhythm.  Without murmurs, rubs, or gallops. Abdomen:  Soft, nontender, nondistended.  Without guarding or rebound. Extremities: Without cyanosis, clubbing, edema, or obvious deformity. Minimal fluctuance superior and medial to R knee. Vascular:  Dorsalis pedis and posterior tibial pulses palpable bilaterally. Skin:  Warm and dry, no erythema, no ulcerations.  Data Reviewed: I have personally reviewed following labs and imaging studies  CBC: Recent Labs  Lab 01/11/21 2020 01/12/21 0717 01/13/21 0341 01/14/21 0249 01/15/21 0247 01/16/21 0349  WBC 10.8*  --  13.2* 12.3* 8.2 7.4  NEUTROABS 8.5*  --   --   --   --   --   HGB 13.5 12.6* 14.5 12.8* 10.9* 11.7*  HCT 37.0* 37.0* 40.1 36.8* 31.0* 33.6*  MCV 85.8  --  83.2 83.6 83.3 83.6  PLT 137*  --  150 126* 114* 120*   Basic Metabolic Panel: Recent Labs  Lab 01/11/21 2020 01/12/21 0300 01/12/21 0717 01/13/21 0341 01/14/21 0249 01/15/21 0247 01/16/21 0349  NA 139  --  138 142 141 138 143  K 4.0  --  3.5 3.7 2.9* 3.2*  3.6  CL 106  --   --  104 105 106 108  CO2 23  --   --  23 23 22 25   GLUCOSE 126*  --   --  134* 143* 129* 131*  BUN 18  --   --  17 32* 45* 22  CREATININE 0.93  --   --  1.33* 1.84* 1.91* 1.16  CALCIUM 8.5*  --   --  9.0 8.4* 8.2* 8.4*  MG  --  2.0  --   --   --   --   --   PHOS  --  2.8  --   --   --   --   --    GFR: Estimated Creatinine Clearance: 57.6 mL/min (by C-G formula based on SCr of 1.16 mg/dL). Liver Function Tests: Recent Labs  Lab 01/11/21 2020  AST 32  ALT 25  ALKPHOS 66  BILITOT 1.1  PROT 5.8*  ALBUMIN 2.7*   No results for input(s): LIPASE, AMYLASE in the last 168 hours.  No results for input(s): AMMONIA in the last 168 hours. Coagulation Profile: No results for input(s): INR, PROTIME in the last 168 hours. Cardiac Enzymes: No results for input(s): CKTOTAL, CKMB, CKMBINDEX, TROPONINI in the last 168 hours. BNP (last 3 results) No results for input(s): PROBNP in the last 8760 hours. HbA1C: No results for input(s): HGBA1C in the last 72 hours. CBG: Recent Labs  Lab 01/15/21 0519 01/15/21 1217 01/15/21 1617 01/15/21 2105 01/16/21 0602  GLUCAP 85 131* 91 130* 144*   Lipid Profile: No results for input(s): CHOL, HDL, LDLCALC, TRIG, CHOLHDL, LDLDIRECT in the last 72 hours. Thyroid Function Tests: No results for input(s): TSH, T4TOTAL, FREET4, T3FREE, THYROIDAB in the last 72 hours. Anemia Panel: No results for input(s): VITAMINB12, FOLATE, FERRITIN, TIBC, IRON, RETICCTPCT in the last 72 hours. Sepsis Labs: Recent Labs  Lab 01/11/21 2052 01/11/21 2352  PROCALCITON <0.10  --   LATICACIDVEN  --  1.7    Recent Results (from the past 240 hour(s))  Blood culture (routine x 2)     Status: None (Preliminary result)   Collection Time: 01/11/21 11:36 PM   Specimen: BLOOD RIGHT HAND  Result Value Ref Range Status   Specimen Description BLOOD RIGHT HAND  Final   Special Requests   Final    BOTTLES DRAWN AEROBIC AND ANAEROBIC Blood Culture results may  not be optimal due to an inadequate volume of blood received in culture bottles   Culture   Final    NO GROWTH 3 DAYS Performed at [facility], [address] [place], [place] [address]    Report Status PENDING  Incomplete  Blood culture (routine x 2)     Status: None (Preliminary result)   Collection Time: 01/12/21  4:44 AM   Specimen: BLOOD  Result Value Ref Range Status   Specimen Description BLOOD SITE NOT SPECIFIED  Final   Special Requests   Final    AEROBIC BOTTLE ONLY Blood Culture results may not be optimal due to an inadequate volume of blood received in culture bottles   Culture   Final    NO GROWTH 3 DAYS Performed at [facility], [address] [place], [place] [address]    Report Status PENDING  Incomplete  MRSA PCR Screening     Status: None   Collection Time: 01/13/21  6:32 PM   Specimen: Nasopharyngeal  Result Value Ref Range Status   MRSA by PCR NEGATIVE NEGATIVE Final    Comment:        The GeneXpert MRSA Assay (FDA approved for NASAL specimens only), is one component of a comprehensive MRSA colonization surveillance program. It is not intended to diagnose MRSA infection nor to guide or monitor treatment for MRSA infections. Performed at [facility], [address] [place], [place] [address]      Radiology Studies: DG Knee 1-2 Views Right  Result Date: 01/14/2021 CLINICAL DATA:  Knee pain with significant swelling EXAM: RIGHT KNEE - 1-2 VIEW COMPARISON:  08/11/2008 FINDINGS: No fracture or malalignment. Mild patellofemoral and lateral joint space degenerative change. Faint joint space calcifications. Generalized soft tissue swelling with small moderate suprapatellar joint effusion. Vascular calcifications. IMPRESSION: 1. No acute osseous abnormality. 2. Generalized soft tissue swelling with small to moderate suprapatellar joint effusion. Electronically Signed   By: Jasmine PangKim  Fujinaga M.D.   On: 01/14/2021 19:20   Scheduled Meds: .  allopurinol  300 mg Oral Daily  . amoxicillin-clavulanate  1 tablet Oral Q12H  .  apixaban  5 mg Oral BID  . atorvastatin  40 mg Oral QHS  . Chlorhexidine Gluconate Cloth  6 each Topical Daily  . memantine  28 mg Oral Daily   And  . donepezil  10 mg Oral QHS  . folic acid  1 mg Oral Daily  . insulin aspart  0-15 Units Subcutaneous TID WC  . insulin aspart  0-5 Units Subcutaneous QHS  . losartan  100 mg Oral Daily  . metoprolol succinate  50 mg Oral Daily  . multivitamin with minerals  1 tablet Oral Daily  . oxybutynin  5 mg Oral BID  . pantoprazole  40 mg Oral Q1200  . sodium chloride flush  3 mL Intravenous Q12H  . thiamine  100 mg Oral Daily   Continuous Infusions: . sodium chloride       LOS: 4 days   Time spent:  Azucena Fallen, DO [facility]  If 7PM-7AM, please contact night-coverage [email or web address]  01/16/2021, 7:20 AM

## 2021-01-16 NOTE — Consult Note (Signed)
Consultation: Gross hematuria, difficult Foley Requested by: Dr. Carma LeavenWilliam Lancaster  History of Present Illness: [name] is a 80 year old male with a history of CHF and recent PE.  He is on Eliquis.  He was admitted with hypoxic respiratory failure.  He was being diuresed and may have had some issues voiding yesterday.  Also he had a bump in his creatinine to 1.91 from baseline of around 1 and coud catheter was attempted by nursing and reported as difficult.  Report of initial ability to flush catheter but catheter did not flush this afternoon and more blood per meatus.  He denies any prior issues voiding.  He is complaining of pain from the Foley and rectal tube.  Past Medical History:  Diagnosis Date  . "walking corpse" syndrome   . Acute kidney failure, unspecified (HCC)   . Acute kidney failure, unspecified (HCC)   . Acute on chronic combined systolic and diastolic CHF (congestive heart failure) (HCC) 01/12/2021  . Anemia, unspecified   . Arthritis   . Dementia   . Dementia (HCC) 04/24/2012  . Disorder of bone and cartilage, unspecified   . Dizziness and giddiness   . GERD (gastroesophageal reflux disease)   . Gout   . Gout, unspecified   . Hypertension   . Hypopotassemia   . Hypotension, unspecified   . Hypotension, unspecified   . Memory loss   . Memory loss   . Muscle weakness (generalized)   . Myalgia and myositis, unspecified   . Neuromuscular disorder (HCC)   . Other and unspecified hyperlipidemia   . Screening for ischemic heart disease   . Tobacco use disorder   . Type II or unspecified type diabetes mellitus without mention of complication, uncontrolled   . Type II or unspecified type diabetes mellitus without mention of complication, uncontrolled   . Unspecified hereditary and idiopathic peripheral neuropathy   . Unspecified vitamin D deficiency    Past Surgical History:  Procedure Laterality Date  . INGUINAL HERNIA REPAIR    . VIDEO ASSISTED THORACOSCOPY  (VATS)/EMPYEMA Right 04/30/2015   Procedure: VIDEO ASSISTED THORACOSCOPY (VATS)/DRAIN EMPYEMA;  Surgeon: Kerin PernaPeter Van Trigt, MD;  [facility];  Service: Thoracic;  Laterality: Right;  [name]. VIDEO BRONCHOSCOPY N/A 04/30/2015   Procedure: VIDEO BRONCHOSCOPY;  Surgeon: Kerin PernaPeter Van Trigt, MD;  [facility];  Service: Thoracic;  Laterality: N/A;    Home Medications:  Medications Prior to Admission  Medication Sig Dispense Refill Last Dose  . albuterol (VENTOLIN HFA) 108 (90 Base) MCG/ACT inhaler Inhale 2 puffs into the lungs every 6 (six) hours. (Patient taking differently: Inhale 2 puffs into the lungs in the morning and at bedtime.) 6.7 g 0 01/10/2021 at Unknown time  . allopurinol (ZYLOPRIM) 300 MG tablet Take 300 mg by mouth daily.   01/10/2021 at Unknown time  . APIXABAN (ELIQUIS) VTE STARTER PACK (10MG  AND 5MG ) Take as directed on package: start with two-5mg  tablets twice daily for 7 days. On day 8, switch to one-5mg  tablet twice daily. 1 each 0 01/10/2021 at 2000  . atorvastatin (LIPITOR) 40 MG tablet Take 1 tablet (40 mg total) by mouth at bedtime. 30 tablet 0 01/10/2021 at Unknown time  . benzonatate (TESSALON PERLES) 100 MG capsule Take 1 capsule (100 mg total) by mouth 3 (three) times daily as needed for cough. 30 capsule 0 01/10/2021 at Unknown time  . folic acid (FOLVITE) 1 MG tablet Take 1 tablet (1 mg total) by mouth daily. 30 tablet 0 01/10/2021 at Unknown time  .  linagliptin (TRADJENTA) 5 MG TABS tablet Take 5 mg by mouth daily.   01/10/2021 at Unknown time  . losartan (COZAAR) 100 MG tablet TAKE 1 TABLET(100 MG) BY MOUTH DAILY (Patient taking differently: Take 100 mg by mouth daily.) 90 tablet 1 01/10/2021 at Unknown time  . Memantine HCl-Donepezil HCl (NAMZARIC) 28-10 MG CP24 Take 1 capsule by mouth daily. 90 capsule 1 01/10/2021 at Unknown time  . metFORMIN (GLUCOPHAGE) 500 MG tablet TAKE 1 TABLET BY MOUTH EVERY DAY WITH BREAKFAST (Patient taking differently: Take 500 mg by mouth daily with  breakfast. TAKE 1 TABLET BY MOUTH EVERY DAY WITH BREAKFAST) 90 tablet 1 01/10/2021 at Unknown time  . metoprolol succinate (TOPROL-XL) 50 MG 24 hr tablet Take 1 tablet (50 mg total) by mouth daily. Take with or immediately following a meal. 30 tablet 0 01/10/2021 at Unknown time  . Multiple Vitamin (MULTIVITAMIN WITH MINERALS) TABS tablet Take 1 tablet by mouth daily.   01/10/2021 at Unknown time  . pantoprazole (PROTONIX) 40 MG tablet Take 1 tablet (40 mg total) by mouth daily at 12 noon. 30 tablet 0 01/10/2021 at Unknown time  . potassium chloride SA (KLOR-CON) 20 MEQ tablet Take 3 tablets (60 mEq total) by mouth daily. 90 tablet 3 01/10/2021 at Unknown time  . thiamine 100 MG tablet Take 1 tablet (100 mg total) by mouth daily. 30 tablet 0 01/10/2021 at Unknown time   Allergies:  Allergies  Allergen Reactions  . Penicillins Rash    Family History  Problem Relation Age of Onset  . Diabetes Mother   . Hypertension Mother   . Diabetes Father   . Hypertension Father   . Kidney disease Father   . Diabetes Sister   . Kidney disease Brother    Social History:  reports that he quit smoking about 51 years ago. He has never used smokeless tobacco. He reports current alcohol use. He reports previous drug use. Drug: Marijuana.  ROS: A complete review of systems was performed.  All systems are negative except for pertinent findings as noted. Review of Systems  All other systems reviewed and are negative.    Physical Exam:  Vital signs in last 24 hours: Temp:  [98.1 F (36.7 C)-98.7 F (37.1 C)] 98.4 F (36.9 C) (02/27 1144) Pulse Rate:  [77-92] 77 (02/27 1144) Resp:  [16-18] 18 (02/27 1144) BP: (91-112)/(55-77) 91/59 (02/27 1144) SpO2:  [96 %-99 %] 99 % (02/27 1144) Weight:  [76.3 kg] 76.3 kg (02/27 0011) General:  Alert and oriented, No acute distress, looks uncomfortable HEENT: Normocephalic, atraumatic Cardiovascular: Regular rate and rhythm Lungs: Regular rate and effort Abdomen:  Soft, bladder palpably distended  Extremities: No edema Neurologic: Grossly intact GU: Penis circumcised without mass or lesion.  Foreskin, glans and meatus normal without lesion.  Blood per meatus around Foley catheter. Foley catheter extending too far out from urethra and balloon palpable in the distal bulbar urethra through the scrotum.  Procedure: The balloon was deflated and the catheter removed.  He was bleeding per meatus.  He was prepped and draped in the usual sterile fashion and a 20 [place] coud hematuria catheter was attempted to be passed but I could not make it up through the prostate.  Therefore, given the patient's pain and distress, gross hematuria and distended bladder he needs urgent decompression.  Therefore the flexible cystoscope was passed and the light was dim I could not see any specific urethral trauma but I was able to follow the urethra and mucosa and  find the prostate and then up into the bladder.  It was too dark to visualize the bladder.  The cheater was passed into the cystoscope and urine drained.  A sensor wire was advanced and coiled in the bladder and the flexible cystoscope back out.  Now the 20 [place] coud hematuria catheter which has an open scooped end was able to be passed over the wire into the bladder.  The wire was removed and 15 cc put in the balloon.  The balloon was seated at the bladder neck.  In proper position much of the catheter was within the urethra.  1400 cc of bloody urine was drained.  I then irrigated the catheter and it was quite bloody but no clots.  I placed it on a slow CBI and it quickly went to clear to light pink.  Laboratory Data:  Results for orders placed or performed during the hospital encounter of 01/11/21 (from the past 24 hour(s))  Glucose, capillary     Status: Abnormal   Collection Time: 01/15/21  9:05 PM  Result Value Ref Range   Glucose-Capillary 130 (H) 70 - 99 mg/dL   Comment 1 Notify RN    Comment 2 Document in Chart    Basic metabolic panel     Status: Abnormal   Collection Time: 01/16/21  3:49 AM  Result Value Ref Range   Sodium 143 135 - 145 mmol/L   Potassium 3.6 3.5 - 5.1 mmol/L   Chloride 108 98 - 111 mmol/L   CO2 25 22 - 32 mmol/L   Glucose, Bld 131 (H) 70 - 99 mg/dL   BUN 22 8 - 23 mg/dL   Creatinine, Ser 7.25 0.61 - 1.24 mg/dL   Calcium 8.4 (L) 8.9 - 10.3 mg/dL   GFR, Estimated >36 >64 mL/min   Anion gap 10 5 - 15  CBC     Status: Abnormal   Collection Time: 01/16/21  3:49 AM  Result Value Ref Range   WBC 7.4 4.0 - 10.5 K/uL   RBC 4.02 (L) 4.22 - 5.81 MIL/uL   Hemoglobin 11.7 (L) 13.0 - 17.0 g/dL   HCT 40.3 (L) 47.4 - 25.9 %   MCV 83.6 80.0 - 100.0 fL   MCH 29.1 26.0 - 34.0 pg   MCHC 34.8 30.0 - 36.0 g/dL   RDW 56.3 87.5 - 64.3 %   Platelets 120 (L) 150 - 400 K/uL   nRBC 0.0 0.0 - 0.2 %  Glucose, capillary     Status: Abnormal   Collection Time: 01/16/21  6:02 AM  Result Value Ref Range   Glucose-Capillary 144 (H) 70 - 99 mg/dL   Comment 1 Notify RN    Comment 2 Document in Chart   Glucose, capillary     Status: Abnormal   Collection Time: 01/16/21 11:41 AM  Result Value Ref Range   Glucose-Capillary 139 (H) 70 - 99 mg/dL   Recent Results (from the past 240 hour(s))  Blood culture (routine x 2)     Status: None (Preliminary result)   Collection Time: 01/11/21 11:36 PM   Specimen: BLOOD RIGHT HAND  Result Value Ref Range Status   Specimen Description BLOOD RIGHT HAND  Final   Special Requests   Final    BOTTLES DRAWN AEROBIC AND ANAEROBIC Blood Culture results may not be optimal due to an inadequate volume of blood received in culture bottles   Culture   Final    NO GROWTH 4 DAYS Performed at [facility]  Lab, [address], [place], [place] [address]    Report Status PENDING  Incomplete  Blood culture (routine x 2)     Status: None (Preliminary result)   Collection Time: 01/12/21  4:44 AM   Specimen: BLOOD  Result Value Ref Range Status   Specimen Description BLOOD  SITE NOT SPECIFIED  Final   Special Requests   Final    AEROBIC BOTTLE ONLY Blood Culture results may not be optimal due to an inadequate volume of blood received in culture bottles   Culture   Final    NO GROWTH 4 DAYS Performed at [facility], [address], [place], [place] [address]    Report Status PENDING  Incomplete  MRSA PCR Screening     Status: None   Collection Time: 01/13/21  6:32 PM   Specimen: Nasopharyngeal  Result Value Ref Range Status   MRSA by PCR NEGATIVE NEGATIVE Final    Comment:        The GeneXpert MRSA Assay (FDA approved for NASAL specimens only), is one component of a comprehensive MRSA colonization surveillance program. It is not intended to diagnose MRSA infection nor to guide or monitor treatment for MRSA infections. Performed at [facility], [address] [place], [place] [address]    Creatinine: Recent Labs    01/11/21 2020 01/13/21 0341 01/14/21 0249 01/15/21 0247 01/16/21 0349  CREATININE 0.93 1.33* 1.84* 1.91* 1.16    Impression/Assessment/plan:  Gross hematuria-this is likely related to Foley trauma.  It is difficult to tell how large his prostate is or contributing but he was definitely having a lot of pain and has a long urethra and that might have made catheter placement difficult and he might of pulled on the catheter to pull it out into the urethra. Recommend renal ultrasound to screen the kidneys and rule out any significant clot in the bladder.  Urinary retention-I would recommend Foley catheter drainage for at least 5 to 7 days to allow his bladder to recover and the urethra to heal.  If needed he could follow-up in office for voiding trial. Add tamsulosin 0.4 mg po qhs.   Jerilee Field 01/16/2021, 5:29 PM

## 2021-01-17 DIAGNOSIS — J9601 Acute respiratory failure with hypoxia: Secondary | ICD-10-CM | POA: Diagnosis not present

## 2021-01-17 LAB — CBC
HCT: 33.1 % — ABNORMAL LOW (ref 39.0–52.0)
Hemoglobin: 11.6 g/dL — ABNORMAL LOW (ref 13.0–17.0)
MCH: 29.1 pg (ref 26.0–34.0)
MCHC: 35 g/dL (ref 30.0–36.0)
MCV: 83 fL (ref 80.0–100.0)
Platelets: 130 10*3/uL — ABNORMAL LOW (ref 150–400)
RBC: 3.99 MIL/uL — ABNORMAL LOW (ref 4.22–5.81)
RDW: 15.2 % (ref 11.5–15.5)
WBC: 7.2 10*3/uL (ref 4.0–10.5)
nRBC: 0 % (ref 0.0–0.2)

## 2021-01-17 LAB — BASIC METABOLIC PANEL
Anion gap: 10 (ref 5–15)
BUN: 12 mg/dL (ref 8–23)
CO2: 24 mmol/L (ref 22–32)
Calcium: 8.2 mg/dL — ABNORMAL LOW (ref 8.9–10.3)
Chloride: 105 mmol/L (ref 98–111)
Creatinine, Ser: 1 mg/dL (ref 0.61–1.24)
GFR, Estimated: >60 mL/min (ref >60)
Glucose, Bld: 90 mg/dL (ref 70–99)
Potassium: 3.3 mmol/L — ABNORMAL LOW (ref 3.5–5.1)
Sodium: 139 mmol/L (ref 135–145)

## 2021-01-17 LAB — CULTURE, BLOOD (ROUTINE X 2)
Culture: NO GROWTH
Culture: NO GROWTH

## 2021-01-17 LAB — GLUCOSE, CAPILLARY
Glucose-Capillary: 76 mg/dL (ref 70–99)
Glucose-Capillary: 105 mg/dL — ABNORMAL HIGH (ref 70–99)
Glucose-Capillary: 139 mg/dL — ABNORMAL HIGH (ref 70–99)
Glucose-Capillary: 132 mg/dL — ABNORMAL HIGH (ref 70–99)

## 2021-01-17 MED ORDER — LOPERAMIDE HCL 1 MG/7.5ML PO SUSP
2.0000 mg | ORAL | Status: DC | PRN
  Administered 2021-01-17: 2 mg via ORAL
  Filled 2021-01-17 (×2): qty 15

## 2021-01-17 NOTE — Plan of Care (Signed)
  Problem: Elimination: Goal: Will not experience complications related to urinary retention Outcome: Progressing   Problem: Pain Managment: Goal: General experience of comfort will improve Outcome: Progressing   Problem: Safety: Goal: Ability to remain free from injury will improve Outcome: Progressing   

## 2021-01-17 NOTE — Progress Notes (Signed)
Heart Failure Stewardship Pharmacist Progress Note   PCP: [place], MD PCP-Cardiologist: No primary care provider on file.    HPI:  80 yo M with PMH of dementia, HTN, T2DM, HFrEF, and heavy alcohol use. He presented back to the ED on 01/11/21 and was admitted for acute respiratory failure following a recent hospitalization from 12/24/20-12/30/20 with COVID-19 PNA and PE. An ECHO wad done on 01/12/21 and LVEF is 20-25%.  Current HF Medications: Furosemide 40 mg daily Metoprolol XL 50 mg daily Losartan 100 mg daily  Prior to admission HF Medications: Metoprolol XL 50 mg daily Losartan 100 mg daily  Pertinent Lab Values: . Serum creatinine 1.00, BUN 12, Potassium 3.3, Sodium 139, BNP 697.7   Vital Signs: . Weight: 163 lbs (admission weight: 180 lbs) . Blood pressure: 110/70-90s  . Heart rate: 90s   Medication Assistance / Insurance Benefits Check: Does the patient have prescription insurance?  Yes Type of insurance plan: UHC Medicare  Does the patient qualify for medication assistance through manufacturers or grants? Pending . Eligible grants and/or patient assistance programs: [place] . Medication assistance applications in progress: none  . Medication assistance applications approved: none Approved medication assistance renewals will be completed by: pending  Outpatient Pharmacy:  Prior to admission outpatient pharmacy: [facility] Is the patient willing to use [facility] TOC pharmacy at discharge? Yes Is the patient willing to transition their outpatient pharmacy to utilize a [facility] outpatient pharmacy?   Pending    Assessment: 1. Acute on chronic systolic CHF (EF 82-95%), due to unknown etiology. NYHA class III symptoms.  - Continue furosemide 40 mg daily - Continue metoprolol XL 50 mg daily - On losartan 100 mg daily. Consider optimizing to Entresto 24/26 mg BID. - Consider starting spironolactone and Farxiga prior to discharge pending SCr trends    Plan: 1) Medication changes recommended at this time: - Stop losartan - Start Entresto 24/26 mg BID  2) Patient assistance application(s): - None pending - Entresto copay $47 per month - can help enroll in patient assistance to bring copay down to $0 per month - Farxiga copay $47 per month - can help enroll in patient assistance to bring copay down to $0 per month  3)  Education  - To be completed prior to discharge  Sharen Hones, PharmD, BCPS Heart Failure Engineer, building services Phone [phone]

## 2021-01-17 NOTE — TOC Progression Note (Addendum)
Transition of Care [facility]) - Progression Note    Patient Details  Name: ATZEL MCCAMBRIDGE MRN: [id number] Date of Birth: 1941/01/29  Transition [facility]) CM/SW Contact  Katina Remick [place], [place] Phone Number: 01/17/2021, 2:22 PM  Clinical Narrative:     [facility] and [facility] can both accept pt  CSW calls pt daughter Docia Furl and provides bed offers. She plans to talk with her other siblings to discuss further.   CSW received call from pt's son Colburn Asper. Pt's son asks questions about future care with ALF facilities. CSW explains that pt's social worker at [facility] rehab could assist with ALF placement moving further. CSW provides general information about levels of care and medicare coverage including that pt could get DME through his medicare in the case that pt needs to return home with aides. Son explains he is leaning towards [facility] as choice but wants to discuss further with siblings  1633: CSW started insurance auth. Ref# [id number]   Clinicals faxed to [employer]. A facility needs to be added to auth  Expected Discharge Plan: Skilled Nursing Facility    Expected Discharge Plan and Services Expected Discharge Plan: Skilled Nursing Facility                                               Social Determinants of Health (SDOH) Interventions Food Insecurity Interventions: Intervention Not Indicated Financial Strain Interventions: Intervention Not Indicated Housing Interventions: Intervention Not Indicated Transportation Interventions: Intervention Not Indicated Alcohol Brief Interventions/Follow-up: AUDIT Score <7 follow-up not indicated  Readmission Risk Interventions Readmission Risk Prevention Plan 12/27/2020  Transportation Screening Complete  PCP or Specialist Appt within 5-7 Days Complete  Home Care Screening Complete  Medication Review (RN CM) Referral to Pharmacy  Some recent data might be hidden

## 2021-01-17 NOTE — Progress Notes (Addendum)
PROGRESS NOTE    [name]  ZOX:[id number] DOB: 08-04-1941 DOA: 01/11/2021 PCP: [place], MD   Brief Narrative:  Gary Mcneil is a 80 y.o. 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 Pt recently admitted for acute hypoxic resp failure due to COVID-19 PNA and pulmonary embolism on 2/4.  Also noted to have acute CHF during that admit treated with dose of lasix.  Pt discharged home on O2 with ambulation on 2/10. Pt lives alone with family checking in on him.  They checked on him at noon and he was fine.  They checked back in on him at 4pm they found him down on the floor and he appeared to be unconscious with hypoxia into the 60s on their arrival per report. In ED: CT head and neck is neg. CTA chest: Poor contrast timing, lungs show increasingly confluent opacities compared to earlier this month.  Patient admitted for acute hypoxic respiratory failure in the setting of heart failure exacerbation with questionable underlying pneumonia.  Patient diuresed quite well, continues on antibiotics through 01/17/2021.  Patient currently being evaluated by PT recommending SNF due to somewhat profound ambulatory dysfunction in the setting of acute hypoxia and heart failure exacerbation.  Patient incidentally noted to have urinary obstruction, acute on chronic, urology consulted due to issues with clogged coud catheter, replaced -will discharge with catheter in place follow-up with urology in 1 week per their office schedule.  Patient also had loose bowel movements without clear etiology -treated with Imodium, rectal tube removed. At this time patient medically stable for discharge awaiting SNF placement and approval.  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 Lasix 40 daily - Hypoxia resolving quickly with diuretics consistent with heart failure exacerbation and rapid diuresis - Negative procalcitonin, continue antibiotics given symptoms concern for aspiration/pneumonitis and high risk for infection: Vancomycin/Zosyn discontinued, de-escalating to Augmentin -completed course 01/17/2021 - Repeat echo - 20-25% with severely decreased function of the left ventricle and global hypokinesis -continue diuresis as below - Off COVID precautions - completed quarantine already  Mild AKI without history of CKD the setting of diuretics  Concurrent urinary obstruction - Urology replaced catheter, now draining blood-tinged urine without obstruction -Continue p.o. Lasix 40 daily  Diarrhea, improving  -Unlikely infectious given patient symptoms, unclear if diet related or somewhat more chronic in the setting of previous alcohol use/dietary changes -Improving with Imodium, discontinue rectal tube  R knee fluctuance -Improving with diuresis -Plain film shows no acute process, likely enlarged bursa -does not appear to affect patient's ambulatory status.  HTN, essential Continue home medications  History of PE, POA Continue eliquis  Chronic alcohol use    CIWA protocol ongoing, not scoring  DM2 Hold home PO meds Mod scale SSI AC/HS  Dementia Continue home memantine - donepezil  DVT prophylaxis: Eliquis Code Status: Full  Family Communication: Son updated at length about  disposition/prognosis/acute changes and issues  Status is: Inpatient  Dispo: The patient is from: Home              Anticipated d/c is to: SNF              Anticipated d/c date is: 24 to 48 hours              Patient currently IS medically stable for discharge - awaiting placement  Consultants:   Urology  Procedures:   None  Antimicrobials:  Zosyn, vancomycin -discontinued 01/14/2021 Augmentin -stop 01/17/2021  Subjective: No acute issues or events overnight, patient's mental status essentially back to baseline. Patient denies overt symptoms of chest pain, nausea, vomiting, diarrhea, constipation, headache, fevers, chills.  Objective: Vitals:   01/17/21 0409 01/17/21 0500 01/17/21 0719 01/17/21 1137  BP: (!) 103/92  115/71 125/69  Pulse: 91  88 92  Resp: (!) 22  16 16   Temp: 98.6 F (37 C)  98.1 F (36.7 C) 98.5 F (36.9 C)  TempSrc: Oral  Oral Oral  SpO2: 98%  98% 97%  Weight:  74.2 kg    Height:        Intake/Output Summary (Last 24 hours) at 01/17/2021 1217 Last data filed at 01/17/2021 1036 Gross per 24 hour  Intake 1600 ml  Output 5650 ml  Net -4050 ml   Filed Weights   01/16/21 0011 01/17/21 0051 01/17/21 0500  Weight: 76.3 kg 74.2 kg 74.2 kg    Examination:  General:  Pleasantly resting in bed, No acute distress. HEENT:  Normocephalic atraumatic.  Sclerae nonicteric, noninjected.  Extraocular movements intact bilaterally. Neck:  Without mass or deformity.  Trachea is midline. Lungs: Scant bibasilar rales without overt wheeze or rhonchi. Heart:  Regular rate and rhythm.  Without murmurs, rubs, or gallops. Abdomen:  Soft, nontender, nondistended.  Without guarding or rebound.  Foley draining blood-tinged urine, rectal tube empty-minimal output Extremities: Without cyanosis, clubbing, edema, or obvious deformity. Minimal fluctuance superior and medial to R knee. Vascular:  Dorsalis pedis and posterior tibial pulses palpable bilaterally. Skin:  Warm and  dry, no erythema, no ulcerations.  Data Reviewed: I have personally reviewed following labs and imaging studies  CBC: Recent Labs  Lab 01/11/21 2020 01/12/21 0717 01/13/21 0341 01/14/21 0249 01/15/21 0247 01/16/21 0349 01/17/21 0246  WBC 10.8*  --  13.2* 12.3* 8.2 7.4 7.2  NEUTROABS 8.5*  --   --   --   --   --   --   HGB 13.5   < > 14.5 12.8* 10.9* 11.7* 11.6*  HCT 37.0*   < > 40.1 36.8* 31.0* 33.6* 33.1*  MCV 85.8  --  83.2 83.6 83.3 83.6 83.0  PLT 137*  --  150 126* 114* 120* 130*   < > = values in this interval not displayed.   Basic Metabolic Panel: Recent Labs  Lab 01/12/21 0300 01/12/21 0717 01/13/21 0341 01/14/21 0249 01/15/21 0247 01/16/21 0349 01/17/21 0246  NA  --    < > 142 141 138 143 139  K  --    < > 3.7 2.9* 3.2* 3.6 3.3*  CL  --   --  104 105 106 108 105  CO2  --   --  23 23 22 25 24   GLUCOSE  --   --  134* 143* 129* 131* 90  BUN  --   --  17 32* 45* 22 12  CREATININE  --   --  1.33* 1.84* 1.91* 1.16 1.00  CALCIUM  --   --  9.0 8.4* 8.2* 8.4* 8.2*  MG 2.0  --   --   --   --   --   --   PHOS 2.8  --   --   --   --   --   --    < > = values in this interval not displayed.   GFR: Estimated Creatinine Clearance: 64.9 mL/min (by C-G formula based on SCr of 1 mg/dL). Liver Function Tests: Recent Labs  Lab 01/11/21 2020  AST 32  ALT 25  ALKPHOS 66  BILITOT 1.1  PROT 5.8*  ALBUMIN 2.7*   No results for input(s): LIPASE, AMYLASE in the last 168 hours. No results for input(s): AMMONIA in the last 168 hours. Coagulation Profile: No results for input(s): INR, PROTIME in the last 168 hours. Cardiac Enzymes: No results for input(s): CKTOTAL, CKMB, CKMBINDEX, TROPONINI in the last 168 hours. BNP (last 3 results) No results for input(s): PROBNP in the last 8760 hours. HbA1C: No results for input(s): HGBA1C in the last 72 hours. CBG: Recent Labs  Lab 01/16/21 0602 01/16/21 1141 01/16/21 2127 01/17/21 0616 01/17/21 1112  GLUCAP 144* 139* 104*  76 105*   Lipid Profile: No results for input(s): CHOL, HDL, LDLCALC, TRIG, CHOLHDL, LDLDIRECT in the last 72 hours. Thyroid Function Tests: No results for input(s): TSH, T4TOTAL, FREET4, T3FREE, THYROIDAB in the last 72 hours. Anemia Panel: No results for input(s): VITAMINB12, FOLATE, FERRITIN, TIBC, IRON, RETICCTPCT in the last 72 hours. Sepsis Labs: Recent Labs  Lab 01/11/21 2052 01/11/21 2352  PROCALCITON <0.10  --   LATICACIDVEN  --  1.7    Recent Results (from the past 240 hour(s))  Blood culture (routine x 2)     Status: None (Preliminary result)   Collection Time: 01/11/21 11:36 PM   Specimen: BLOOD RIGHT HAND  Result Value Ref Range Status   Specimen Description BLOOD RIGHT HAND  Final   Special Requests   Final    BOTTLES DRAWN AEROBIC AND ANAEROBIC Blood Culture results may not be optimal due to an inadequate volume of blood received in culture bottles   Culture   Final    NO GROWTH 4 DAYS Performed at [facility], [address] [place], [place] [address]    Report Status PENDING  Incomplete  Blood culture (routine x 2)     Status: None (Preliminary result)   Collection Time: 01/12/21  4:44 AM   Specimen: BLOOD  Result Value Ref Range Status   Specimen Description BLOOD SITE NOT SPECIFIED  Final   Special Requests   Final    AEROBIC BOTTLE ONLY Blood Culture results may not be optimal due to an inadequate volume of blood received in culture bottles   Culture   Final    NO GROWTH 4 DAYS Performed at [facility], [address] [place], [place] [address]    Report Status PENDING  Incomplete  MRSA PCR Screening     Status: None   Collection Time: 01/13/21  6:32 PM   Specimen: Nasopharyngeal  Result Value Ref Range Status   MRSA by PCR NEGATIVE NEGATIVE Final    Comment:        The GeneXpert MRSA Assay (FDA approved for NASAL specimens only), is one component of a comprehensive MRSA colonization surveillance program. It is not intended to  diagnose MRSA infection nor to guide or monitor treatment for MRSA infections.  Performed at [facility], [address] [place], [place] [address]      Radiology Studies: No results found. Scheduled Meds: . allopurinol  300 mg Oral Daily  . amoxicillin-clavulanate  1 tablet Oral Q12H  . apixaban  5 mg Oral BID  . atorvastatin  40 mg Oral QHS  . Chlorhexidine Gluconate Cloth  6 each Topical Daily  . memantine  28 mg Oral Daily   And  . donepezil  10 mg Oral QHS  . folic acid  1 mg Oral Daily  . furosemide  40 mg Oral Daily  . insulin aspart  0-15 Units Subcutaneous TID WC  . insulin aspart  0-5 Units Subcutaneous QHS  . losartan  100 mg Oral Daily  . metoprolol succinate  50 mg Oral Daily  . multivitamin with minerals  1 tablet Oral Daily  . oxybutynin  5 mg Oral BID  . pantoprazole  40 mg Oral Q1200  . sodium chloride flush  3 mL Intravenous Q12H  . thiamine  100 mg Oral Daily   Continuous Infusions: . sodium chloride    . sodium chloride irrigation       LOS: 5 days   Time spent:  Azucena Fallen, DO [facility]  If 7PM-7AM, please contact night-coverage [email or web address]  01/17/2021, 12:17 PM

## 2021-01-17 NOTE — Care Management Important Message (Signed)
Important Message  Patient Details  Name: Gary Mcneil MRN: [id number] Date of Birth: 1941/07/27   Medicare Important Message Given:  Yes     Renie Ora 01/17/2021, 9:36 AM

## 2021-01-17 NOTE — Progress Notes (Signed)
Physical Therapy Treatment Patient Details Name: Gary Mcneil MRN: [id number] DOB: July 16, 1941 Today's Date: 01/17/2021    History of Present Illness Gary Mcneil is a 80 y.o. 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 Pt recently admitted for acute hypoxic resp failure due to COVID-19 PNA and pulmonary embolism on 2/4.  Also noted to have acute CHF during that admit treated with dose of lasix.  Pt discharged home on O2 with ambulation on 2/10. Pt lives alone with family checking in on him.  They checked on him at noon and he was fine.  They checked back in on him at 4pm they found him down on the floor and he appeared to be unconscious with hypoxia into the 60s on their arrival per report. In ED: CT head and neck is neg. CTA chest: Poor contrast timing, lungs show increasingly confluent opacities compared to earlier this month.    PT Comments    Cognition limiting session today. Pt initially agreeable to ambulating with PT in the hallway and became adamant that he will not attempt once mobility was initiated. Then reports he wanted to try and sit EOB however again became adamant that he will not attempt once mobility initiated. Focus of session was redirected to positioning in the bed. Pt appeared fatigued and all movement appeared effortful. Will continue to follow and progress as able per POC.    Follow Up Recommendations  SNF;Supervision/Assistance - 24 hour     Equipment Recommendations  None recommended by PT    Recommendations for Other Services       Precautions / Restrictions Precautions Precautions: Other (comment);Fall Precaution Comments: Flexi-seal Restrictions Weight Bearing Restrictions: No    Mobility  Bed Mobility Overal bed mobility: Needs Assistance             General bed mobility comments: Repositioned in the bed. Pt with active movement of LE's however requires  assist for all other aspects today.    Transfers                 General transfer comment: Pt refused  Ambulation/Gait             General Gait Details: Pt refused   Stairs             Wheelchair Mobility    Modified Rankin (Stroke Patients Only)       Balance Overall balance assessment: Needs assistance Sitting-balance support: No upper extremity supported;Feet supported Sitting balance-Leahy Scale: Fair     Standing balance support: Bilateral upper extremity supported Standing balance-Leahy Scale: Poor Standing balance comment: requires UE support                            Cognition Arousal/Alertness: Awake/alert Behavior During Therapy: WFL for tasks assessed/performed Overall Cognitive Status: History of cognitive impairments - at baseline                                        Exercises      General Comments        Pertinent Vitals/Pain Pain Assessment: No/denies pain    Home Living                      Prior Function  PT Goals (current goals can now be found in the care plan section) Acute Rehab PT Goals Patient Stated Goal: To get better PT Goal Formulation: With patient Time For Goal Achievement: 01/27/21 Potential to Achieve Goals: Good Progress towards PT goals: Progressing toward goals    Frequency    Min 2X/week      PT Plan Frequency needs to be updated;Discharge plan needs to be updated    Co-evaluation              AM-PAC PT "6 Clicks" Mobility   Outcome Measure  Help needed turning from your back to your side while in a flat bed without using bedrails?: None Help needed moving from lying on your back to sitting on the side of a flat bed without using bedrails?: A Little Help needed moving to and from a bed to a chair (including a wheelchair)?: A Little Help needed standing up from a chair using your arms (e.g., wheelchair or bedside chair)?: A  Little Help needed to walk in hospital room?: A Little Help needed climbing 3-5 steps with a railing? : A Lot 6 Click Score: 18    End of Session Equipment Utilized During Treatment: Oxygen Activity Tolerance: Patient tolerated treatment well;Patient limited by fatigue Patient left: in chair;with call bell/phone within reach;with chair alarm set Nurse Communication: Mobility status PT Visit Diagnosis: Unsteadiness on feet (R26.81);Muscle weakness (generalized) (M62.81);Other abnormalities of gait and mobility (R26.89)     Time: 5176-1607 PT Time Calculation (min) (ACUTE ONLY): 15 min  Charges:  $Therapeutic Activity: 8-22 mins                     Conni Slipper, PT, DPT Acute Rehabilitation Services Pager: [phone] Office: [phone]    Marylynn Pearson 01/17/2021, 1:49 PM

## 2021-01-17 NOTE — Progress Notes (Signed)
S: Pt without complaints. 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   O:  Intake/Output Summary (Last 24 hours) at 01/17/2021 1738 Last data filed at 01/17/2021 1700 Gross per 24 hour  Intake 480 ml  Output 3800 ml  Net -3320 ml   NAD Foley in proper position - urine light red in tubing - no clots  A/P:  1) urethral trauma - continue foley until Monday 3/7 and can void trial if pt stable.   2) gross hematuria - likely from urethral trauma but could benefit from outpt evaluation ([place] and cysto). He does not require CBI. Irrigate foley as needed.   I will sign off but please page GU with any questions, concerns or changes in pt status.

## 2021-01-18 ENCOUNTER — Other Ambulatory Visit: Payer: Self-pay | Admitting: Internal Medicine

## 2021-01-18 DIAGNOSIS — J9601 Acute respiratory failure with hypoxia: Secondary | ICD-10-CM | POA: Diagnosis not present

## 2021-01-18 LAB — BASIC METABOLIC PANEL
Anion gap: 10 (ref 5–15)
BUN: 13 mg/dL (ref 8–23)
CO2: 25 mmol/L (ref 22–32)
Calcium: 8.3 mg/dL — ABNORMAL LOW (ref 8.9–10.3)
Chloride: 103 mmol/L (ref 98–111)
Creatinine, Ser: 1.11 mg/dL (ref 0.61–1.24)
GFR, Estimated: >60 mL/min (ref >60)
Glucose, Bld: 120 mg/dL — ABNORMAL HIGH (ref 70–99)
Potassium: 3.3 mmol/L — ABNORMAL LOW (ref 3.5–5.1)
Sodium: 138 mmol/L (ref 135–145)

## 2021-01-18 LAB — CBC
HCT: 33.3 % — ABNORMAL LOW (ref 39.0–52.0)
Hemoglobin: 11.7 g/dL — ABNORMAL LOW (ref 13.0–17.0)
MCH: 29.4 pg (ref 26.0–34.0)
MCHC: 35.1 g/dL (ref 30.0–36.0)
MCV: 83.7 fL (ref 80.0–100.0)
Platelets: 174 10*3/uL (ref 150–400)
RBC: 3.98 MIL/uL — ABNORMAL LOW (ref 4.22–5.81)
RDW: 15 % (ref 11.5–15.5)
WBC: 6 10*3/uL (ref 4.0–10.5)
nRBC: 0 % (ref 0.0–0.2)

## 2021-01-18 LAB — GLUCOSE, CAPILLARY
Glucose-Capillary: 100 mg/dL — ABNORMAL HIGH (ref 70–99)
Glucose-Capillary: 138 mg/dL — ABNORMAL HIGH (ref 70–99)

## 2021-01-18 MED ORDER — LOSARTAN POTASSIUM 100 MG PO TABS: 50.0000 mg | ORAL_TABLET | Freq: Every day | ORAL | 0 refills | Status: AC

## 2021-01-18 MED ORDER — LOPERAMIDE HCL 1 MG/7.5ML PO SUSP: 2.0000 mg | ORAL | 0 refills | Status: DC | PRN

## 2021-01-18 MED ORDER — FUROSEMIDE 40 MG PO TABS: 40.0000 mg | ORAL_TABLET | Freq: Every day | ORAL | 0 refills | Status: AC

## 2021-01-18 MED ORDER — APIXABAN 5 MG PO TABS: 5.0000 mg | ORAL_TABLET | Freq: Two times a day (BID) | ORAL | 0 refills | Status: DC

## 2021-01-18 MED FILL — FUROSEMIDE 40 MG TABLET: 40 | 30 days supply | Qty: 30 | Fill #0

## 2021-01-18 MED FILL — LOSARTAN POTASSIUM 100 MG T: 100 | 60 days supply | Qty: 30 | Fill #0

## 2021-01-18 MED FILL — LOPERAMIDE 2 MG CAPSULE: 2 | 15 days supply | Qty: 30 | Fill #0

## 2021-01-18 MED FILL — ELIQUIS 5 MG TABLET: 5 | 30 days supply | Qty: 60 | Fill #0

## 2021-01-18 NOTE — Progress Notes (Signed)
PROGRESS NOTE    Gary Mcneil  ZOX:[id number] DOB: 08/21/1941 DOA: 01/11/2021 PCP: Harvest ForestBakare, Mobolaji B, MD   Brief Narrative:  Gary Mcneil is a 80 y.o. 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 Pt recently admitted for acute hypoxic resp failure due to COVID-19 PNA and pulmonary embolism on 2/4.  Also noted to have acute CHF during that admit treated with dose of lasix.  Pt discharged home on O2 with ambulation on 2/10. Pt lives alone with family checking in on him.  They checked on him at noon and he was fine.  They checked back in on him at 4pm they found him down on the floor and he appeared to be unconscious with hypoxia into the 60s on their arrival per report. In ED: CT head and neck is neg. CTA chest: Poor contrast timing, lungs show increasingly confluent opacities compared to earlier this month.  Patient admitted for acute hypoxic respiratory failure in the setting of heart failure exacerbation with questionable underlying pneumonia.  Patient diuresed quite well, continues on antibiotics through 01/17/2021.  Patient currently being evaluated by PT recommending SNF due to somewhat profound ambulatory dysfunction in the setting of acute hypoxia and heart failure exacerbation.  Patient incidentally noted to have urinary obstruction, acute on chronic, urology consulted due to issues with clogged coud catheter (it appears patient had pulled the catheter into his urethra causing obstruction and trauma) urology replaced -will discharge with catheter in place follow-up with urology in 1 week per their office schedule.  Patient also had loose bowel movements without clear etiology -treated with Imodium, rectal tube removed. At this time patient medically stable for discharge awaiting SNF placement and approval.  He continues to request discharge home, he remains quite unstable and remains inappropriate for  discharge home at this time given ongoing ambulatory dysfunction and weakness, hypoxia and new indwelling Foley catheter.  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) and diastolic (congestive) heart failure (HCC)  Acute on chronic resp failure with hypoxia, improving Likely heart failure exacerbation Cannot rule out aspiration vs HCAP pneumonia - Creatinine within normal limits, resume p.o. Lasix 40 daily - Hypoxia resolving quickly with diuretics consistent with heart failure exacerbation and rapid diuresis - Negative procalcitonin, continue antibiotics given symptoms concern for aspiration/pneumonitis and high risk for infection: Vancomycin/Zosyn discontinued, de-escalating to Augmentin -completed course 01/17/2021 - Repeat echo - 20-25% with severely decreased function of the left ventricle and global hypokinesis -continue diuresis -continue losartan and metoprolol for core measures -unfortunately patient's blood pressure appears to be quite low, holding off on initiation of Entresto.  Defer to outpatient cardiology. - Off COVID precautions - completed quarantine  Mild AKI without history of CKD the setting of diuretics  Concurrent urinary obstruction - Urology replaced daily catheter, now draining blood-tinged urine without obstruction -Continue p.o. Lasix 40 daily  Diarrhea, improving  -Unlikely infectious given patient symptoms, unclear if diet related or somewhat more chronic in the setting of previous alcohol use/dietary changes -Improving with Imodium, discontinue rectal tube  R  knee  fluctuance/bursitis -Improving with diuresis -Plain film shows no acute process, likely enlarged bursa -does not appear to affect patient's ambulatory status.  HTN, essential Continue home medications  History of PE, POA Continue eliquis  Chronic alcohol use    CIWA protocol ongoing, not scoring  DM2 Hold home PO meds Mod scale SSI AC/HS  Dementia Continue home memantine - donepezil  DVT prophylaxis: Eliquis Code Status: Full Family Communication: Son updated at length about disposition/prognosis/acute changes and issues  Status is: Inpatient  Dispo: The patient is from: Home              Anticipated d/c is to: SNF              Anticipated d/c date is: 24 to 48 hours              Patient currently IS medically stable for discharge - awaiting placement  Consultants:   Urology  Procedures:   None  Antimicrobials:  Zosyn, vancomycin -discontinued 01/14/2021 Augmentin -stop 01/17/2021  Subjective: No acute issues or events overnight, patient denies nausea vomiting diarrhea constipation headache fevers or chills. He continues to request discharge home which we discussed was likely inappropriate given his ongoing ambulatory dysfunction, hypoxia, indwelling Foley and high risk for falls and decompensation.  We encouraged him to continue to talk to his family about possible discharge with them so he may have further support at home but at this time even with 24-hour support at home he would be fairly unstable and high risk for discharge.  Objective: Vitals:   01/17/21 2202 01/17/21 2206 01/18/21 0045 01/18/21 0429  BP:  109/69 (!) 96/54 119/68  Pulse:  90 82 83  Resp: (!) 31 (!) 21 17 (!) 22  Temp:  99.3 F (37.4 C) 98.4 F (36.9 C) 98.4 F (36.9 C)  TempSrc:  Oral Oral Oral  SpO2:  100% 96% 91%  Weight:   74 kg   Height:        Intake/Output Summary (Last 24 hours) at 01/18/2021 0721 Last data filed at 01/18/2021 0347 Gross per 24 hour  Intake 720 ml  Output 1450  ml  Net -730 ml   Filed Weights   01/17/21 0051 01/17/21 0500 01/18/21 0045  Weight: 74.2 kg 74.2 kg 74 kg    Examination:  General:  Pleasantly resting in bed, No acute distress. HEENT:  Normocephalic atraumatic.  Sclerae nonicteric, noninjected.  Extraocular movements intact bilaterally. Neck:  Without mass or deformity.  Trachea is midline. Lungs: Scant bibasilar rales without overt wheeze or rhonchi. Heart:  Regular rate and rhythm.  Without murmurs, rubs, or gallops. Abdomen:  Soft, nontender, nondistended. Without guarding or rebound.  Foley draining blood-tinged urine. Extremities: Without cyanosis, clubbing, edema, or obvious deformity. Minimal fluctuance superior and medial to R knee. Vascular:  Dorsalis pedis and posterior tibial pulses palpable bilaterally. Skin:  Warm and dry, no erythema, no ulcerations.  Data Reviewed: I have personally reviewed following labs and imaging studies  CBC: Recent Labs  Lab 01/11/21 2020 01/12/21 0717 01/14/21 0249 01/15/21 0247 01/16/21 0349 01/17/21 0246 01/18/21 0627  WBC 10.8*   < > 12.3* 8.2 7.4 7.2 6.0  NEUTROABS 8.5*  --   --   --   --   --   --   HGB 13.5   < > 12.8* 10.9* 11.7* 11.6* 11.7*  HCT 37.0*   < > 36.8* 31.0* 33.6* 33.1* 33.3*  MCV 85.8   < > 83.6 83.3 83.6  83.0 83.7  PLT 137*   < > 126* 114* 120* 130* 174   < > = values in this interval not displayed.   Basic Metabolic Panel: Recent Labs  Lab 01/12/21 0300 01/12/21 0717 01/13/21 0341 01/14/21 0249 01/15/21 0247 01/16/21 0349 01/17/21 0246  NA  --    < > 142 141 138 143 139  K  --    < > 3.7 2.9* 3.2* 3.6 3.3*  CL  --   --  104 105 106 108 105  CO2  --   --  23 23 22 25 24   GLUCOSE  --   --  134* 143* 129* 131* 90  BUN  --   --  17 32* 45* 22 12  CREATININE  --   --  1.33* 1.84* 1.91* 1.16 1.00  CALCIUM  --   --  9.0 8.4* 8.2* 8.4* 8.2*  MG 2.0  --   --   --   --   --   --   PHOS 2.8  --   --   --   --   --   --    < > = values in this interval  not displayed.   GFR: Estimated Creatinine Clearance: 64.8 mL/min (by C-G formula based on SCr of 1 mg/dL). Liver Function Tests: Recent Labs  Lab 01/11/21 2020  AST 32  ALT 25  ALKPHOS 66  BILITOT 1.1  PROT 5.8*  ALBUMIN 2.7*   No results for input(s): LIPASE, AMYLASE in the last 168 hours. No results for input(s): AMMONIA in the last 168 hours. Coagulation Profile: No results for input(s): INR, PROTIME in the last 168 hours. Cardiac Enzymes: No results for input(s): CKTOTAL, CKMB, CKMBINDEX, TROPONINI in the last 168 hours. BNP (last 3 results) No results for input(s): PROBNP in the last 8760 hours. HbA1C: No results for input(s): HGBA1C in the last 72 hours. CBG: Recent Labs  Lab 01/17/21 0616 01/17/21 1112 01/17/21 1601 01/17/21 2201 01/18/21 0620  GLUCAP 76 105* 139* 132* 100*   Lipid Profile: No results for input(s): CHOL, HDL, LDLCALC, TRIG, CHOLHDL, LDLDIRECT in the last 72 hours. Thyroid Function Tests: No results for input(s): TSH, T4TOTAL, FREET4, T3FREE, THYROIDAB in the last 72 hours. Anemia Panel: No results for input(s): VITAMINB12, FOLATE, FERRITIN, TIBC, IRON, RETICCTPCT in the last 72 hours. Sepsis Labs: Recent Labs  Lab 01/11/21 2052 01/11/21 2352  PROCALCITON <0.10  --   LATICACIDVEN  --  1.7    Recent Results (from the past 240 hour(s))  Blood culture (routine x 2)     Status: None   Collection Time: 01/11/21 11:36 PM   Specimen: BLOOD RIGHT HAND  Result Value Ref Range Status   Specimen Description BLOOD RIGHT HAND  Final   Special Requests   Final    BOTTLES DRAWN AEROBIC AND ANAEROBIC Blood Culture results may not be optimal due to an inadequate volume of blood received in culture bottles   Culture   Final    NO GROWTH 5 DAYS Performed at [facility], [address], [place], [place]    Report Status 01/17/2021 FINAL  Final  Blood culture (routine x 2)     Status: None   Collection Time: 01/12/21  4:44 AM    Specimen: BLOOD  Result Value Ref Range Status   Specimen Description BLOOD SITE NOT SPECIFIED  Final   Special Requests   Final    AEROBIC BOTTLE ONLY Blood Culture results  may not be optimal due to an inadequate volume of blood received in culture bottles   Culture   Final    NO GROWTH 5 DAYS Performed at [facility], [address], [place], [place] [address]    Report Status 01/17/2021 FINAL  Final  MRSA PCR Screening     Status: None   Collection Time: 01/13/21  6:32 PM   Specimen: Nasopharyngeal  Result Value Ref Range Status   MRSA by PCR NEGATIVE NEGATIVE Final    Comment:        The GeneXpert MRSA Assay (FDA approved for NASAL specimens only), is one component of a comprehensive MRSA colonization surveillance program. It is not intended to diagnose MRSA infection nor to guide or monitor treatment for MRSA infections. Performed at [facility], [address] [place], [place] [address]      Radiology Studies: No results found. Scheduled Meds: . allopurinol  300 mg Oral Daily  . apixaban  5 mg Oral BID  . atorvastatin  40 mg Oral QHS  . Chlorhexidine Gluconate Cloth  6 each Topical Daily  . memantine  28 mg Oral Daily   And  . donepezil  10 mg Oral QHS  . folic acid  1 mg Oral Daily  . furosemide  40 mg Oral Daily  . insulin aspart  0-15 Units Subcutaneous TID WC  . insulin aspart  0-5 Units Subcutaneous QHS  . losartan  100 mg Oral Daily  . metoprolol succinate  50 mg Oral Daily  . multivitamin with minerals  1 tablet Oral Daily  . oxybutynin  5 mg Oral BID  . pantoprazole  40 mg Oral Q1200  . sodium chloride flush  3 mL Intravenous Q12H  . thiamine  100 mg Oral Daily   Continuous Infusions: . sodium chloride    . sodium chloride irrigation       LOS: 6 days   Time spent:  Azucena Fallen, DO [facility]  If 7PM-7AM, please contact night-coverage [email or web address]  01/18/2021, 7:21 AM

## 2021-01-18 NOTE — Progress Notes (Signed)
NURSING PROGRESS NOTE  [facility] STOCKING [id number] Discharge Data: 01/18/2021 4:39 PM Attending Provider: Azucena Fallen, MD [id number], Cyndee Brightly, MD     Edwin Dada to be D/C'd Skilled nursing facility per MD order.  Discussed with the patient the After Visit Summary and all questions fully answered. All IV's discontinued with no bleeding noted. All belongings returned to patient for patient to take home. Report called to Ashely at [facility].  Last Vital Signs:  Blood pressure (!) 88/56, pulse 92, temperature 97.8 F (36.6 C), resp. rate 16, height 6' (1.829 m), weight 74 kg, SpO2 98 %.  Discharge Medication List Allergies as of 01/18/2021      Reactions   Penicillins Rash      Medication List    STOP taking these medications   Apixaban Starter Pack (10mg  and 5mg ) Commonly known as: ELIQUIS STARTER PACK Replaced by: apixaban 5 MG Tabs tablet     TAKE these medications   albuterol 108 (90 Base) MCG/ACT inhaler Commonly known as: VENTOLIN HFA Inhale 2 puffs into the lungs every 6 (six) hours. What changed: when to take this   allopurinol 300 MG tablet Commonly known as: ZYLOPRIM Take 300 mg by mouth daily.   apixaban 5 MG Tabs tablet Commonly known as: ELIQUIS Take 1 tablet (5 mg total) by mouth 2 (two) times daily. Replaces: Apixaban Starter Pack (10mg  and 5mg )   atorvastatin 40 MG tablet Commonly known as: LIPITOR Take 1 tablet (40 mg total) by mouth at bedtime.   benzonatate 100 MG capsule Commonly known as: Tessalon Perles Take 1 capsule (100 mg total) by mouth 3 (three) times daily as needed for cough.   folic acid 1 MG tablet Commonly known as: FOLVITE Take 1 tablet (1 mg total) by mouth daily.   furosemide 40 MG tablet Commonly known as: LASIX Take 1 tablet (40 mg total) by mouth daily.   linagliptin 5 MG Tabs tablet Commonly known as: TRADJENTA Take 5 mg by mouth daily.   loperamide HCl 1 MG/7.5ML suspension Commonly known as: IMODIUM Take  15 mLs (2 mg total) by mouth as needed for diarrhea or loose stools.   losartan 100 MG tablet Commonly known as: COZAAR Take 0.5 tablets (50 mg total) by mouth daily. Start taking on: January 19, 2021 What changed:   how much to take  how to take this  when to take this  additional instructions   metFORMIN 500 MG tablet Commonly known as: GLUCOPHAGE TAKE 1 TABLET BY MOUTH EVERY DAY WITH BREAKFAST What changed:   how much to take  how to take this  when to take this   metoprolol succinate 50 MG 24 hr tablet Commonly known as: TOPROL-XL Take 1 tablet (50 mg total) by mouth daily. Take with or immediately following a meal.   multivitamin with minerals Tabs tablet Take 1 tablet by mouth daily.   Namzaric 28-10 MG Cp24 Generic drug: Memantine HCl-Donepezil HCl Take 1 capsule by mouth daily.   pantoprazole 40 MG tablet Commonly known as: PROTONIX Take 1 tablet (40 mg total) by mouth daily at 12 noon.   potassium chloride SA 20 MEQ tablet Commonly known as: KLOR-CON Take 3 tablets (60 mEq total) by mouth daily.   thiamine 100 MG tablet Take 1 tablet (100 mg total) by mouth daily.

## 2021-01-18 NOTE — Discharge Summary (Signed)
Physician Discharge Summary  Gary Mcneil:[id number] DOB: 07/07/1941 DOA: 01/11/2021  PCP: Har[place], MD  Admit date: 01/11/2021 Discharge date: 01/18/2021  Admitted From: Home Disposition: SNF  Recommendations for Outpatient Follow-up:  1. Follow up with PCP in 1-2 weeks 2. Please obtain BMP/CBC in one week 3. Please follow up with urology next week as scheduled:  Discharge Condition: Stable CODE STATUS: Full Diet recommendation: Low-carb low-salt diet  Brief/Interim Summary: Gary Mcneil a 80 y.o.malewith medical history significant ofdementia, HTN, DM2, HFrEF with EF 30-35% as of earlier this month, heavy EtOH use / abuse. Pt recently admitted for acute hypoxic resp failuredue to COVID-19 PNA and pulmonary embolism on 2/4. Also noted to have acute CHF during that admit treated with dose of lasix. Pt discharged home on O2 with ambulation on 2/10. Pt lives alone with family checking in on him. They checked on him at noon and he was fine. They checked back in on him at 4pm they found him down on the floor and he appeared to be unconscious with hypoxia into the 60s on their arrival per report. In ED: CT head and neck is neg. CTA chest: Poor contrast timing, lungs show increasingly confluent opacities compared to earlier this month.  Patient admitted for acute hypoxic respiratory failure in the setting of heart failure exacerbation with questionable underlying pneumonia.  Patient diuresed quite well, continues on antibiotics through 01/17/2021.  Patient currently being evaluated by PT recommending SNF due to somewhat profound ambulatory dysfunction in the setting of acute hypoxia and heart failure exacerbation.  Patient incidentally noted to have urinary obstruction, acute on chronic, urology consulted due to issues with clogged coud catheter (it appears patient had pulled the catheter into his urethra causing obstruction and trauma) urology replaced -will discharge with  catheter in place follow-up with urology in 1 week per their office schedule.  Patient also had loose bowel movements without clear etiology -treated with Imodium, rectal tube removed. At this time patient medically stable for discharge awaiting SNF placement and approval.  He continues to request discharge home, he remains quite unstable and remains inappropriate for discharge home at this time given ongoing ambulatory dysfunction and weakness, hypoxia and new indwelling Foley catheter.  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) and diastolic (congestive) heart failure [facility])    Discharge Instructions  Discharge Instructions    Call MD for:  difficulty breathing, headache or visual disturbances   Complete by: As directed    Call MD for:  difficulty breathing, headache or visual disturbances   Complete by: As directed    Call MD for:  extreme fatigue   Complete by: As directed    Call MD for:  temperature >100.4   Complete by: As directed    Call MD for:  temperature >100.4   Complete by: As directed    Diet - low sodium heart healthy   Complete by: As directed    Diet Carb Modified   Complete by: As directed    Increase activity slowly   Complete by: As directed    Increase activity slowly   Complete  by: As directed      Allergies as of 01/18/2021      Reactions   Penicillins Rash      Medication List    TAKE these medications   albuterol 108 (90 Base) MCG/ACT inhaler Commonly known as: VENTOLIN HFA Inhale 2 puffs into the lungs every 6 (six) hours. What changed: when to take  this   allopurinol 300 MG tablet Commonly known as: ZYLOPRIM Take 300 mg by mouth daily.   Apixaban Starter Pack (10mg  and 5mg ) Commonly known as: ELIQUIS STARTER PACK Take as directed on package: start with two-5mg  tablets twice daily for 7 days. On day 8, switch to one-5mg  tablet twice daily.   atorvastatin 40 MG tablet Commonly known as: LIPITOR Take 1 tablet (40 mg total) by mouth at bedtime.   benzonatate 100 MG capsule Commonly known as: Tessalon Perles Take 1 capsule (100 mg total) by mouth 3 (three) times daily as needed for cough.   folic acid 1 MG tablet Commonly known as: FOLVITE Take 1 tablet (1 mg total) by mouth daily.   furosemide 40 MG tablet Commonly known as: LASIX Take 1 tablet (40 mg total) by mouth daily.   linagliptin 5 MG Tabs tablet Commonly known as: TRADJENTA Take 5 mg by mouth daily.   loperamide HCl 1 MG/7.5ML suspension Commonly known as: IMODIUM Take 15 mLs (2 mg total) by mouth as needed for diarrhea or loose stools.   losartan 100 MG tablet Commonly known as: COZAAR Take 0.5 tablets (50 mg total) by mouth daily. Start taking on: January 19, 2021 What changed:   how much to take  how to take this  when to take this  additional instructions   metFORMIN 500 MG tablet Commonly known as: GLUCOPHAGE TAKE 1 TABLET BY MOUTH EVERY DAY WITH BREAKFAST What changed:   how much to take  how to take this  when to take this   metoprolol succinate 50 MG 24 hr tablet Commonly known as: TOPROL-XL Take 1 tablet (50 mg total) by mouth daily. Take with or immediately following a meal.   multivitamin with minerals Tabs tablet Take 1 tablet by mouth daily.   Namzaric 28-10 MG Cp24 Generic drug: Memantine HCl-Donepezil HCl Take 1 capsule by mouth daily.   pantoprazole 40 MG tablet Commonly known as: PROTONIX Take 1 tablet (40 mg total) by mouth daily at 12 noon.   potassium chloride SA 20 MEQ tablet Commonly known as: KLOR-CON Take 3  tablets (60 mEq total) by mouth daily.   thiamine 100 MG tablet Take 1 tablet (100 mg total) by mouth daily.       Allergies  Allergen Reactions  . Penicillins Rash    Consultations: Urology  Procedures/Studies: DG Knee 1-2 Views Right  Result Date: 01/14/2021 CLINICAL DATA:  Knee pain with significant swelling EXAM: RIGHT KNEE - 1-2 VIEW COMPARISON:  08/11/2008 FINDINGS: No fracture or malalignment. Mild patellofemoral and lateral joint space degenerative change. Faint joint space calcifications. Generalized soft tissue swelling with small moderate suprapatellar joint effusion. Vascular calcifications. IMPRESSION: 1. No acute osseous abnormality. 2. Generalized soft tissue swelling with small to moderate suprapatellar joint effusion. Electronically Signed   By: 01/16/2021 M.D.   On: 01/14/2021 19:20   CT Head Wo Contrast  Result Date: 01/11/2021 CLINICAL DATA:  Status post fall.  Recent history of COVID positive. EXAM: CT HEAD WITHOUT CONTRAST TECHNIQUE: Contiguous axial images were obtained from the base of the skull through the vertex without intravenous  contrast. COMPARISON:  December 30, 2014 FINDINGS: Brain: There is mild to moderate severity cerebral atrophy with widening of the extra-axial spaces and stable ventricular dilatation. There are areas of decreased attenuation within the white matter tracts of the supratentorial brain, consistent with microvascular disease changes. Vascular: No hyperdense vessel or unexpected calcification. Skull: Negative for an acute fracture. A stable 9 mm x 5 mm benign-appearing sclerotic focus is seen along the vertex on the right (axial CT image 73, CT series number 4). Sinuses/Orbits: No acute finding. Other: None. IMPRESSION: 1. Generalized cerebral atrophy with stable ventriculomegaly. A component of superimposed communicating hydrocephalus cannot be excluded. 2. No acute intracranial abnormality. Electronically Signed   By: Aram Candela  M.D.   On: 01/11/2021 20:34   CT Angio Chest PE W and/or Wo Contrast  Result Date: 01/11/2021 CLINICAL DATA:  Weakness and shortness of breath after discharge for COVID 19 EXAM: CT ANGIOGRAPHY CHEST WITH CONTRAST TECHNIQUE: Multidetector CT imaging of the chest was performed using the standard protocol during bolus administration of intravenous contrast. Multiplanar CT image reconstructions and MIPs were obtained to evaluate the vascular anatomy. CONTRAST:  80mL OMNIPAQUE IOHEXOL 350 MG/ML SOLN, OMNIPAQUE IOHEXOL 350 MG/ML SOLN COMPARISON:  CT 12/24/2020 FINDINGS: Cardiovascular: Initial contrast administration resulting in suboptimal bolus timing significantly limiting detection of pulmonary artery emboli. Repeat imaging was performed with a slightly improved central pulmonary artery attenuation though port technique results in persistently suboptimal distal opacification beyond the left and right main pulmonary arteries with exam further degraded by extensive respiratory motion artifact. No large central pulmonary artery filling defects are identified. A previously seen subsegmental filling defect in the right lower lobe is beyond the limits of detection on this study. Central pulmonary arteries are notably enlarged though similar to comparison. Normal heart size. No pericardial effusion. Three-vessel coronary artery atherosclerosis. Atherosclerotic plaque within the normal caliber aorta. Suboptimal opacification for a luminal assessment of the aorta. No gross aortic abnormality is discernible. Normal 3 vessel branching of the great vessels. Minimal plaque in the proximal great vessels. Mediastinum/Nodes: No mediastinal fluid or gas. Normal thyroid gland and thoracic inlet. No acute abnormality of the trachea or esophagus. No worrisome mediastinal, hilar or axillary adenopathy. Lungs/Pleura: Diffuse heterogeneous opacities again seen throughout the lungs, with increasingly coalescent consolidative  opacity in the regions of previously seen ground-glass compatible with a worsening infectious process. Findings on a background of centrilobular and paraseptal emphysematous change. Diffuse airways thickening and scattered secretions are noted. No pneumothorax. Small left pleural effusion is present. Dependent atelectatic features bilaterally. Upper Abdomen: Stable fluid attenuation cyst in the upper pole left kidney. No acute abnormalities present in the visualized portions of the upper abdomen. Musculoskeletal: Degenerative changes are present in the imaged spine and shoulders. No acute osseous abnormality or suspicious osseous lesion. No worrisome chest wall masses or lesions. Review of the MIP images confirms the above findings. IMPRESSION: 1. Markedly suboptimal pulmonary artery assessment given technical factors as detailed above. No large central filling defects are seen. A previously seen subsegmental filling defect in the right lower lobe is beyond the limits of detection. Chronic pulmonary artery enlargement most compatible with pulmonary artery hypertension. 2. Diffuse heterogeneous opacities again seen throughout the lungs, with increasingly coalescent consolidative opacity in the regions of previously seen ground-glass compatible with a worsening infectious process. 3. Small left pleural effusion. 4. Three-vessel coronary artery atherosclerosis. 5.  Emphysema (ICD10-J43.9). 6. Aortic Atherosclerosis (ICD10-I70.0) Electronically Signed   By: Kreg Shropshire M.D.   On:  01/11/2021 22:49   CT Angio Chest PE W and/or Wo Contrast  Addendum Date: 12/24/2020   ADDENDUM REPORT: 12/24/2020 13:30 ADDENDUM: Critical Value/emergent results were called by telephone at the time of interpretation on 12/24/2020 at 1:24 pm to provider [facility] , who verbally acknowledged these results. Electronically Signed   By: Delbert Phenix M.D.   On: 12/24/2020 13:30   Result Date: 12/24/2020 CLINICAL DATA:  COVID positive.   Hypoxia.  Dyspnea.  Tachycardia. EXAM: CT ANGIOGRAPHY CHEST WITH CONTRAST TECHNIQUE: Multidetector CT imaging of the chest was performed using the standard protocol during bolus administration of intravenous contrast. Multiplanar CT image reconstructions and MIPs were obtained to evaluate the vascular anatomy. CONTRAST:  77mL OMNIPAQUE IOHEXOL 350 MG/ML SOLN COMPARISON:  Chest radiograph from earlier today. 04/20/2015 chest CT angiogram. FINDINGS: Cardiovascular: The study is moderate quality for the evaluation of pulmonary embolism, with some motion degradation. Acute segmental and subsegmental anterior left lower lobe pulmonary emboli (series 6/image 161). Tiny subsegmental medial right lower lobe pulmonary embolus (series 6/image 188). No central pulmonary emboli. Atherosclerotic nonaneurysmal thoracic aorta. Markedly dilated main pulmonary artery (4.9 cm diameter), unchanged. Normal heart size. No significant pericardial fluid/thickening. Three-vessel coronary atherosclerosis. Mediastinum/Nodes: No discrete thyroid nodules. Unremarkable esophagus. No pathologically enlarged axillary, mediastinal or hilar lymph nodes. Lungs/Pleura: No pneumothorax. No pleural effusion. Moderate centrilobular and paraseptal emphysema. Extensive patchy ground-glass opacity throughout both lungs involving all lung lobes, new. No lung masses or significant pulmonary nodules. Upper abdomen: Simple 2.5 cm medial upper left renal cyst. Musculoskeletal: No aggressive appearing focal osseous lesions. Mild thoracic spondylosis. Review of the MIP images confirms the above findings. IMPRESSION: 1. Acute segmental and subsegmental bilateral lower lobe pulmonary emboli. No central pulmonary emboli. 2. Extensive patchy ground-glass opacity throughout both lungs involving all lung lobes, new, compatible with COVID-19 pneumonia. 3. Markedly dilated main pulmonary artery, unchanged, suggesting chronic pulmonary arterial hypertension. 4.  Three-vessel coronary atherosclerosis. 5. Aortic Atherosclerosis (ICD10-I70.0) and Emphysema (ICD10-J43.9). Electronically Signed: By: Delbert Phenix M.D. On: 12/24/2020 13:28   CT Cervical Spine Wo Contrast  Result Date: 01/11/2021 CLINICAL DATA:  Status post fall. EXAM: CT CERVICAL SPINE WITHOUT CONTRAST TECHNIQUE: Multidetector CT imaging of the cervical spine was performed without intravenous contrast. Multiplanar CT image reconstructions were also generated. COMPARISON:  None. FINDINGS: Alignment: There is approximately 2 mm anterolisthesis of the C2 vertebral body on C3. Skull base and vertebrae: No acute fracture. Chronic changes are seen along the tip of the dens. Soft tissues and spinal canal: No prevertebral fluid or swelling. No visible canal hematoma. Disc levels: Marked severity endplate sclerosis and anterior osteophyte formation is seen at the levels of C3-C4, C4-C5, C5-C6 and C6-C7. Moderate severity intervertebral disc space narrowing is also seen at the levels of C4-C5 and C5-C6. Marked severity intervertebral disc space narrowing is present at the level of C6-C7. Marked severity bilateral multilevel facet joint hypertrophy is noted. Upper chest: Negative. Other: None. IMPRESSION: 1. Marked severity multilevel degenerative changes, most prominent at the levels of C3-C4, C4-C5, C5-C6 and C6-C7. 2. No acute cervical spine fracture. Electronically Signed   By: Aram Candela M.D.   On: 01/11/2021 20:38   DG CHEST PORT 1 VIEW  Result Date: 01/12/2021 CLINICAL DATA:  Hypoxia. EXAM: PORTABLE CHEST 1 VIEW COMPARISON:  January 11, 2021. FINDINGS: Stable cardiomediastinal silhouette. No pneumothorax is noted. Stable bilateral lung opacities are noted concerning for pneumonia. Bony thorax is unremarkable. IMPRESSION: Stable bilateral lung opacities are noted concerning for pneumonia. Electronically Signed  By: Lupita Raider M.D.   On: 01/12/2021 08:12   DG Chest Portable 1 View  Result Date:  01/11/2021 CLINICAL DATA:  80 year old male with fall. EXAM: PORTABLE CHEST 1 VIEW COMPARISON:  Chest radiograph dated 12/27/2020. FINDINGS: Background of emphysema. Diffuse interstitial coarsening primarily involving the mid to lower lung field and peripherally. Interval progression of associated airspace densities since the prior radiograph concerning for worsening pneumonia. Clinical correlation is recommended. No large pleural effusion. No pneumothorax. Stable cardiac silhouette. No acute osseous pathology. IMPRESSION: 1. Findings concerning for worsening pneumonia. Clinical correlation is recommended. 2. Emphysema. Electronically Signed   By: Elgie Collard M.D.   On: 01/11/2021 20:05   DG Chest Port 1 View  Result Date: 12/27/2020 CLINICAL DATA:  80 year old male with shortness of breath EXAM: PORTABLE CHEST 1 VIEW COMPARISON:  Chest radiograph dated 12/24/2020. chest CT dated 12/24/2020 FINDINGS: Bilateral mid to lower lung field patchy and streaky densities as seen on the prior CT and similar or slightly progressed since the prior radiograph and in keeping with multilobar pneumonia and in keeping with COVID-19. No pleural effusion pneumothorax. Stable cardiac silhouette. No acute osseous pathology. IMPRESSION: Multilobar pneumonia similar or slightly progressed since the prior radiograph. Electronically Signed   By: Elgie Collard M.D.   On: 12/27/2020 19:43   DG Chest Portable 1 View  Result Date: 12/24/2020 CLINICAL DATA:  Shortness of breath. EXAM: PORTABLE CHEST 1 VIEW COMPARISON:  May 26, 2015. FINDINGS: The heart size and mediastinal contours are within normal limits. Both lungs are clear. No pneumothorax or pleural effusion is noted. The visualized skeletal structures are unremarkable. IMPRESSION: No active disease. Electronically Signed   By: Lupita Raider M.D.   On: 12/24/2020 09:46   ECHOCARDIOGRAM COMPLETE  Result Date: 12/25/2020    ECHOCARDIOGRAM REPORT   Patient Name:   Gary Mcneil Date of Exam: 12/25/2020 Medical Rec #:  [id number]       Height:       72.0 in Accession #:    [id number]      Weight:       177.0 lb Date of Birth:  [birth year]        BSA:          2.023 m Patient Age:    79 years        BP:           147/88 mmHg Patient Gender: M               HR:           92 bpm. Exam Location:  Inpatient Procedure: 2D Echo, Cardiac Doppler and Color Doppler Indications:    I50.31 Acute diastolic (congestive) heart failure  History:        Patient has prior history of Echocardiogram examinations, most                 recent 02/26/2020. Covid 19 positive.  Sonographer:    Roosvelt Maser RDCS Referring Phys: [phone] EKTA V PATEL  Sonographer Comments: No parasternal window and Technically difficult study due to poor echo windows. Covid 19 coughing. IMPRESSIONS  1. Left ventricular ejection fraction, by estimation, is 30 to 35%. The left ventricle has moderately decreased function. The left ventricle demonstrates global hypokinesis. There is mild left ventricular hypertrophy. Left ventricular diastolic function  could not be evaluated. There is severe hypokinesis of the left ventricular, mid-apical anterior wall, apical segment and anteroseptal wall.  2. Right ventricular systolic  function is mildly reduced. The right ventricular size is normal. There is normal pulmonary artery systolic pressure.  3. The mitral valve was not assessed. No evidence of mitral valve regurgitation.  4. Tricuspid valve regurgitation is mild to moderate.  5. The aortic valve was not assessed. Aortic valve regurgitation is not visualized.  6. The inferior vena cava is dilated in size with >50% respiratory variability, suggesting right atrial pressure of 8 mmHg. Comparison(s): Changes from prior study are noted. 02/26/2020: LVEF 25-30%, global hypokinesis. FINDINGS  Left Ventricle: Left ventricular ejection fraction, by estimation, is 30 to 35%. The left ventricle has moderately decreased function. The left ventricle  demonstrates global hypokinesis. Severe hypokinesis of the left ventricular, mid-apical anterior wall, apical segment and anteroseptal wall. The left ventricular internal cavity size was normal in size. There is mild left ventricular hypertrophy. Left ventricular diastolic function could not be evaluated. Right Ventricle: The right ventricular size is normal. No increase in right ventricular wall thickness. Right ventricular systolic function is mildly reduced. There is normal pulmonary artery systolic pressure. The tricuspid regurgitant velocity is 2.29 m/s, and with an assumed right atrial pressure of 8 mmHg, the estimated right ventricular systolic pressure is 29.0 mmHg. Left Atrium: Left atrial size was normal in size. Right Atrium: Right atrial size was normal in size. Pericardium: There is no evidence of pericardial effusion. Mitral Valve: The mitral valve was not assessed. No evidence of mitral valve regurgitation. Tricuspid Valve: The tricuspid valve is grossly normal. Tricuspid valve regurgitation is mild to moderate. Aortic Valve: The aortic valve was not assessed. Aortic valve regurgitation is not visualized. Pulmonic Valve: The pulmonic valve was not assessed. Pulmonic valve regurgitation is not visualized. Aorta: Aortic root could not be assessed. Venous: The inferior vena cava is dilated in size with greater than 50% respiratory variability, suggesting right atrial pressure of 8 mmHg. IAS/Shunts: No atrial level shunt detected by color flow Doppler.  IVC IVC diam: 2.10 cm LEFT ATRIUM           Index       RIGHT ATRIUM           Index LA Vol (A4C): 43.7 ml 21.60 ml/m RA Area:     12.20 cm                                   RA Volume:   30.90 ml  15.28 ml/m  TRICUSPID VALVE TR Peak grad:   21.0 mmHg TR Vmax:        229.00 cm/s Zoila Shutter MD Electronically signed by Zoila Shutter MD Signature Date/Time: 12/25/2020/4:34:18 PM    Final    VAS [place] LOWER EXTREMITY VENOUS (DVT)  Result Date: 12/26/2020   Lower Venous DVT Study Indications: Covid-19, elevated D-Dimer.  Limitations: Body habitus. Comparison Study: Prior negative left LEV duplex is available from 04/18/12 Performing Technologist: Sherren Kerns RVS  Examination Guidelines: A complete evaluation includes B-mode imaging, spectral Doppler, color Doppler, and power Doppler as needed of all accessible portions of each vessel. Bilateral testing is considered an integral part of a complete examination. Limited examinations for reoccurring indications may be performed as noted. The reflux portion of the exam is performed with the patient in reverse Trendelenburg.  +---------+---------------+---------+-----------+----------+-------------------+ RIGHT    CompressibilityPhasicitySpontaneityPropertiesThrombus Aging      +---------+---------------+---------+-----------+----------+-------------------+ CFV      Full           Yes  Yes                  sluggish flow noted +---------+---------------+---------+-----------+----------+-------------------+ SFJ      Full                                                             +---------+---------------+---------+-----------+----------+-------------------+ FV Prox  Full                                         sluggish flow noted +---------+---------------+---------+-----------+----------+-------------------+ FV Mid   Full                                                             +---------+---------------+---------+-----------+----------+-------------------+ FV DistalFull                                                             +---------+---------------+---------+-----------+----------+-------------------+ PFV      Full                                         sluggish flow noted +---------+---------------+---------+-----------+----------+-------------------+ POP      Full                                                              +---------+---------------+---------+-----------+----------+-------------------+ PTV                                                   patent by color     +---------+---------------+---------+-----------+----------+-------------------+ PERO                                                  patent by color     +---------+---------------+---------+-----------+----------+-------------------+   +---------+---------------+---------+-----------+----------+---------------+ LEFT     CompressibilityPhasicitySpontaneityPropertiesThrombus Aging  +---------+---------------+---------+-----------+----------+---------------+ CFV      Full           Yes      Yes                  sluggish flow   +---------+---------------+---------+-----------+----------+---------------+ SFJ      Full                                                         +---------+---------------+---------+-----------+----------+---------------+  FV Prox  Full                                                         +---------+---------------+---------+-----------+----------+---------------+ FV Mid   Full                                                         +---------+---------------+---------+-----------+----------+---------------+ FV DistalFull                                                         +---------+---------------+---------+-----------+----------+---------------+ PFV      Full                                                         +---------+---------------+---------+-----------+----------+---------------+ POP                     Yes      Yes                  patent by color +---------+---------------+---------+-----------+----------+---------------+ PTV      Full                                                         +---------+---------------+---------+-----------+----------+---------------+ PERO     Full                                                          +---------+---------------+---------+-----------+----------+---------------+     Summary: BILATERAL: - No evidence of deep vein thrombosis seen in the lower extremities, bilaterally. -   *See table(s) above for measurements and observations. Electronically signed by Sherald Hess MD on 12/26/2020 at 2:28:21 PM.    Final    ECHOCARDIOGRAM LIMITED  Result Date: 01/12/2021    ECHOCARDIOGRAM LIMITED REPORT   Patient Name:   Gary Mcneil Date of Exam: 01/12/2021 Medical Rec #:  [id number]       Height:       72.0 in Accession #:    [id number]      Weight:       180.8 lb Date of Birth:  Aug 21, 1941        BSA:          2.041 m Patient Age:    79 years        BP:           150/139 mmHg Patient Gender: M  HR:           101 bpm. Exam Location:  Inpatient Procedure: Limited Echo, Limited Color Doppler and Cardiac Doppler Indications:    CHF-Acute Systolic I50.21                 CHF-Acute Diastolic I50.31  History:        Patient has prior history of Echocardiogram examinations, most                 recent 12/25/2020. CHF; Risk Factors:Dyslipidemia, Hypertension                 and Diabetes.  Sonographer:    Eulah Pont RDCS Referring Phys: [phone] JARED M GARDNER IMPRESSIONS  1. Left ventricular ejection fraction, by estimation, is 20 to 25%. The left ventricle has severely decreased function. The left ventricle demonstrates global hypokinesis. Left ventricular diastolic function could not be evaluated.  2. Right ventricular systolic function is mildly reduced. The right ventricular size is normal. There is mildly elevated pulmonary artery systolic pressure. The estimated right ventricular systolic pressure is 40.7 mmHg.  3. The mitral valve is normal in structure. Trivial mitral valve regurgitation. No evidence of mitral stenosis.  4. The aortic valve was not well visualized. Aortic valve regurgitation is mild. Mild aortic valve sclerosis is present, with no evidence of aortic valve stenosis. Aortic  regurgitation PHT measures 501 msec.  5. The inferior vena cava is normal in size with greater than 50% respiratory variability, suggesting right atrial pressure of 3 mmHg. FINDINGS  Left Ventricle: Left ventricular ejection fraction, by estimation, is 20 to 25%. The left ventricle has severely decreased function. The left ventricle demonstrates global hypokinesis. The left ventricular internal cavity size was normal in size. There is no left ventricular hypertrophy. Left ventricular diastolic function could not be evaluated. Right Ventricle: The right ventricular size is normal. No increase in right ventricular wall thickness. Right ventricular systolic function is mildly reduced. There is mildly elevated pulmonary artery systolic pressure. The tricuspid regurgitant velocity  is 3.07 m/s, and with an assumed right atrial pressure of 3 mmHg, the estimated right ventricular systolic pressure is 40.7 mmHg. Left Atrium: Left atrial size was normal in size. Right Atrium: Right atrial size was normal in size. Pericardium: There is no evidence of pericardial effusion. Mitral Valve: The mitral valve is normal in structure. Trivial mitral valve regurgitation. No evidence of mitral valve stenosis. Tricuspid Valve: The tricuspid valve is normal in structure. Tricuspid valve regurgitation is mild . No evidence of tricuspid stenosis. Aortic Valve: The aortic valve was not well visualized. Aortic valve regurgitation is mild. Aortic regurgitation PHT measures 501 msec. Mild aortic valve sclerosis is present, with no evidence of aortic valve stenosis. Pulmonic Valve: The pulmonic valve was normal in structure. Pulmonic valve regurgitation is not visualized. No evidence of pulmonic stenosis. Aorta: The aortic root is normal in size and structure. Venous: The inferior vena cava is normal in size with greater than 50% respiratory variability, suggesting right atrial pressure of 3 mmHg. IAS/Shunts: No atrial level shunt detected by  color flow Doppler. LEFT VENTRICLE PLAX 2D LVIDd:         5.20 cm LVIDs:         4.40 cm LV PW:         0.80 cm LV IVS:        0.90 cm LVOT diam:     1.60 cm LV SV:  26 LV SV Index:   13 LVOT Area:     2.01 cm  LV Volumes (MOD) LV vol d, MOD A2C: 131.0 ml LV vol d, MOD A4C: 117.0 ml LV vol s, MOD A2C: 99.3 ml LV vol s, MOD A4C: 71.4 ml LV SV MOD A2C:     31.7 ml LV SV MOD A4C:     117.0 ml LV SV MOD BP:      38.2 ml RIGHT VENTRICLE TAPSE (M-mode): 1.4 cm LEFT ATRIUM         Index LA diam:    2.10 cm 1.03 cm/m  AORTIC VALVE LVOT Vmax:   85.50 cm/s LVOT Vmean:  56.200 cm/s LVOT VTI:    0.129 m AI PHT:      501 msec  AORTA Ao Root diam: 3.30 cm TRICUSPID VALVE TR Peak grad:   37.7 mmHg TR Vmax:        307.00 cm/s  SHUNTS Systemic VTI:  0.13 m Systemic Diam: 1.60 cm Armanda Magic MD Electronically signed by Armanda Magic MD Signature Date/Time: 01/12/2021/5:06:13 PM    Final      Subjective: No acute issues or events overnight   Discharge Exam: Vitals:   01/18/21 0839 01/18/21 1130  BP: 123/66 (!) 88/56  Pulse: 88 92  Resp: 20 16  Temp:  97.8 F (36.6 C)  SpO2: 96% 98%   Vitals:   01/18/21 0429 01/18/21 0742 01/18/21 0839 01/18/21 1130  BP: 119/68 126/75 123/66 (!) 88/56  Pulse: 83 86 88 92  Resp: (!) 22 16 20 16   Temp: 98.4 F (36.9 C) 98.4 F (36.9 C)  97.8 F (36.6 C)  TempSrc: Oral Oral    SpO2: 91% 90% 96% 98%  Weight:      Height:        General: Pt is alert, awake, not in acute distress Cardiovascular: RRR, S1/S2 +, no rubs, no gallops Respiratory: CTA bilaterally, no wheezing, no rhonchi Abdominal: Soft, NT, ND, bowel sounds + Extremities: no edema, no cyanosis    The results of significant diagnostics from this hospitalization (including imaging, microbiology, ancillary and laboratory) are listed below for reference.     Microbiology: Recent Results (from the past 240 hour(s))  Blood culture (routine x 2)     Status: None   Collection Time: 01/11/21 11:36 PM    Specimen: BLOOD RIGHT HAND  Result Value Ref Range Status   Specimen Description BLOOD RIGHT HAND  Final   Special Requests   Final    BOTTLES DRAWN AEROBIC AND ANAEROBIC Blood Culture results may not be optimal due to an inadequate volume of blood received in culture bottles   Culture   Final    NO GROWTH 5 DAYS Performed at [facility], [address] [place], [place] [address]    Report Status 01/17/2021 FINAL  Final  Blood culture (routine x 2)     Status: None   Collection Time: 01/12/21  4:44 AM   Specimen: BLOOD  Result Value Ref Range Status   Specimen Description BLOOD SITE NOT SPECIFIED  Final   Special Requests   Final    AEROBIC BOTTLE ONLY Blood Culture results may not be optimal due to an inadequate volume of blood received in culture bottles   Culture   Final    NO GROWTH 5 DAYS Performed at [facility], [address], [place], [place] [address]    Report Status 01/17/2021 FINAL  Final  MRSA PCR Screening  Status: None   Collection Time: 01/13/21  6:32 PM   Specimen: Nasopharyngeal  Result Value Ref Range Status   MRSA by PCR NEGATIVE NEGATIVE Final    Comment:        The GeneXpert MRSA Assay (FDA approved for NASAL specimens only), is one component of a comprehensive MRSA colonization surveillance program. It is not intended to diagnose MRSA infection nor to guide or monitor treatment for MRSA infections. Performed at [facility], [address] [place], [place] [address]      Labs: BNP (last 3 results) Recent Labs    12/24/20 0928 12/28/20 0744 01/11/21 2020  BNP 553.8* 347.9* 697.7*   Basic Metabolic Panel: Recent Labs  Lab 01/12/21 0300 01/12/21 0717 01/14/21 0249 01/15/21 0247 01/16/21 0349 01/17/21 0246 01/18/21 0627  NA  --    < > 141 138 143 139 138  K  --    < > 2.9* 3.2* 3.6 3.3* 3.3*  CL  --    < > 105 106 108 105 103  CO2  --    < > 23 22 25 24 25   GLUCOSE  --    < > 143* 129* 131* 90 120*  BUN   --    < > 32* 45* 22 12 13   CREATININE  --    < > 1.84* 1.91* 1.16 1.00 1.11  CALCIUM  --    < > 8.4* 8.2* 8.4* 8.2* 8.3*  MG 2.0  --   --   --   --   --   --   PHOS 2.8  --   --   --   --   --   --    < > = values in this interval not displayed.   Liver Function Tests: Recent Labs  Lab 01/11/21 2020  AST 32  ALT 25  ALKPHOS 66  BILITOT 1.1  PROT 5.8*  ALBUMIN 2.7*   No results for input(s): LIPASE, AMYLASE in the last 168 hours. No results for input(s): AMMONIA in the last 168 hours. CBC: Recent Labs  Lab 01/11/21 2020 01/12/21 0717 01/14/21 0249 01/15/21 0247 01/16/21 0349 01/17/21 0246 01/18/21 0627  WBC 10.8*   < > 12.3* 8.2 7.4 7.2 6.0  NEUTROABS 8.5*  --   --   --   --   --   --   HGB 13.5   < > 12.8* 10.9* 11.7* 11.6* 11.7*  HCT 37.0*   < > 36.8* 31.0* 33.6* 33.1* 33.3*  MCV 85.8   < > 83.6 83.3 83.6 83.0 83.7  PLT 137*   < > 126* 114* 120* 130* 174   < > = values in this interval not displayed.   Cardiac Enzymes: No results for input(s): CKTOTAL, CKMB, CKMBINDEX, TROPONINI in the last 168 hours. BNP: Invalid input(s): POCBNP CBG: Recent Labs  Lab 01/17/21 1112 01/17/21 1601 01/17/21 2201 01/18/21 0620 01/18/21 1116  GLUCAP 105* 139* 132* 100* 138*   D-Dimer No results for input(s): DDIMER in the last 72 hours. Hgb A1c No results for input(s): HGBA1C in the last 72 hours. Lipid Profile No results for input(s): CHOL, HDL, LDLCALC, TRIG, CHOLHDL, LDLDIRECT in the last 72 hours. Thyroid function studies No results for input(s): TSH, T4TOTAL, T3FREE, THYROIDAB in the last 72 hours.  Invalid input(s): FREET3 Anemia work up No results for input(s): VITAMINB12, FOLATE, FERRITIN, TIBC, IRON, RETICCTPCT in the last 72 hours. Urinalysis    Component Value Date/Time   COLORURINE  AMBER (A) 04/29/2015 1030   APPEARANCEUR Clear 05/05/2016 1554   LABSPEC 1.024 04/29/2015 1030   PHURINE 6.0 04/29/2015 1030   GLUCOSEU 1+ (A) 05/05/2016 1554   HGBUR  NEGATIVE 04/29/2015 1030   BILIRUBINUR Negative 05/05/2016 1554   KETONESUR NEGATIVE 04/29/2015 1030   PROTEINUR Trace 05/05/2016 1554   PROTEINUR NEGATIVE 04/29/2015 1030   UROBILINOGEN 1.0 04/29/2015 1030   NITRITE Negative 05/05/2016 1554   NITRITE NEGATIVE 04/29/2015 1030   LEUKOCYTESUR Negative 05/05/2016 1554   Sepsis Labs Invalid input(s): PROCALCITONIN,  WBC,  LACTICIDVEN Microbiology Recent Results (from the past 240 hour(s))  Blood culture (routine x 2)     Status: None   Collection Time: 01/11/21 11:36 PM   Specimen: BLOOD RIGHT HAND  Result Value Ref Range Status   Specimen Description BLOOD RIGHT HAND  Final   Special Requests   Final    BOTTLES DRAWN AEROBIC AND ANAEROBIC Blood Culture results may not be optimal due to an inadequate volume of blood received in culture bottles   Culture   Final    NO GROWTH 5 DAYS Performed at [facility], [address], [place], [place] [address]    Report Status 01/17/2021 FINAL  Final  Blood culture (routine x 2)     Status: None   Collection Time: 01/12/21  4:44 AM   Specimen: BLOOD  Result Value Ref Range Status   Specimen Description BLOOD SITE NOT SPECIFIED  Final   Special Requests   Final    AEROBIC BOTTLE ONLY Blood Culture results may not be optimal due to an inadequate volume of blood received in culture bottles   Culture   Final    NO GROWTH 5 DAYS Performed at [facility], [address] [place], [place] [address]    Report Status 01/17/2021 FINAL  Final  MRSA PCR Screening     Status: None   Collection Time: 01/13/21  6:32 PM   Specimen: Nasopharyngeal  Result Value Ref Range Status   MRSA by PCR NEGATIVE NEGATIVE Final    Comment:        The GeneXpert MRSA Assay (FDA approved for NASAL specimens only), is one component of a comprehensive MRSA colonization surveillance program. It is not intended to diagnose MRSA infection nor to guide or monitor treatment for MRSA infections. Performed  at [facility], [address], [place], [place] [address]      Time coordinating discharge: Over 30 minutes  SIGNED:   Azucena Fallen, DO [facility] 01/18/2021, 1:26 PM Pager   If 7PM-7AM, please contact night-coverage [email or web address]

## 2021-01-18 NOTE — Progress Notes (Signed)
Heart Failure Stewardship Pharmacist Progress Note   PCP: [place], MD PCP-Cardiologist: No primary care provider on file.    HPI:  80 yo M with PMH of dementia, HTN, T2DM, HFrEF, and heavy alcohol use. He presented back to the ED on 01/11/21 and was admitted for acute respiratory failure following a recent hospitalization from 12/24/20-12/30/20 with COVID-19 PNA and PE. An ECHO wad done on 01/12/21 and LVEF is 20-25%.  Current HF Medications: Furosemide 40 mg daily Metoprolol XL 50 mg daily Losartan 100 mg daily  Prior to admission HF Medications: Metoprolol XL 50 mg daily Losartan 100 mg daily  Pertinent Lab Values: . Serum creatinine 1.11, BUN 13, Potassium 3.3, Sodium 138, BNP 697.7   Vital Signs: . Weight: 163 lbs (admission weight: 180 lbs) . Blood pressure: 120/60s . Heart rate: 80-90s   Medication Assistance / Insurance Benefits Check: Does the patient have prescription insurance?  Yes Type of insurance plan: UHC Medicare  Does the patient qualify for medication assistance through manufacturers or grants? Pending . Eligible grants and/or patient assistance programs: [place] . Medication assistance applications in progress: none  . Medication assistance applications approved: none Approved medication assistance renewals will be completed by: pending  Outpatient Pharmacy:  Prior to admission outpatient pharmacy: [facility] Is the patient willing to use [facility] TOC pharmacy at discharge? Yes   Assessment: 1. Acute on chronic systolic CHF (EF 06-23%), due to unknown etiology. NYHA class III symptoms.  - Continue furosemide 40 mg daily - Continue metoprolol XL 50 mg daily - On losartan 100 mg daily. Consider optimizing to Entresto 24/26 mg BID. - Consider starting spironolactone prior to discharge pending SCr trends - Would hold off starting [place] given urinary obstruction - higher risk for adverse events with SGLT2i therapy   Plan: 1) Medication  changes recommended at this time: - Stop losartan and start Entresto 24/26 mg BID - Or start spironolactone 12.5 mg daily   2) Patient assistance application(s): - None pending - Entresto copay $47 per month - can help enroll in patient assistance to bring copay down to $0 per month following stay at [facility] - Farxiga copay $47 per month - can help enroll in patient assistance to bring copay down to $0 per month following stay at SNF  3)  Education  - To be completed prior to discharge - Dispo: SNF  Sharen Hones, PharmD, BCPS Heart Failure Stewardship Pharmacist Phone [phone]

## 2021-01-18 NOTE — Plan of Care (Signed)
  Problem: Clinical Measurements: Goal: Ability to maintain clinical measurements within normal limits will improve Outcome: Progressing   Problem: Clinical Measurements: Goal: Will remain free from infection Outcome: Progressing   

## 2021-01-18 NOTE — Progress Notes (Signed)
Heart Failure Navigator Progress Note  Assessed for Heart & Vascular TOC clinic readiness.  Unfortunately at this time the patient does not meet criteria due to DC to SNF for rehab, then plan for [facility]F.   Navigator available for reassessment of patient.   Ozella Rocks, RN, BSN Heart Failure Nurse Navigator [phone]

## 2021-01-18 NOTE — TOC Transition Note (Signed)
Transition of Care [facility]) - CM/SW Discharge Note   Patient Details  Name: Gary Mcneil MRN: [id number] Date of Birth: 06-10-1941  Transition of Care [facility]) CM/SW Contact:  Erin Sons, LCSW Phone Number: 01/18/2021, 2:02 PM   Clinical Narrative:     Patient will DC to: [facility] Anticipated DC date: 01/18/21 Family notified: Enzo Bi Sr.  Transport by: Sharin Mons   Per MD patient ready for DC to [facility]. RN, patient, patient's family, and facility notified of DC. Discharge Summary and FL2 sent to facility. RN to call report prior to discharge [phone]). DC packet on chart. Ambulance transport requested for patient.   CSW will sign off for now as social work intervention is no longer needed. Please consult [place] again if new needs arise.   Final next level of care: Skilled Nursing Facility Barriers to Discharge: No Barriers Identified   Patient Goals and CMS Choice Patient states their goals for this hospitalization and ongoing recovery are:: Daughter wants SNF. Pt not oriented      Discharge Placement              Patient chooses bed at:  ([facility]) Patient to be transferred to facility by: [facility] Name of family member notified: Hanley Ben. Son Patient and family notified of of transfer: 01/18/21  Discharge Plan and Services                                     Social Determinants of Health (SDOH) Interventions Food Insecurity Interventions: Intervention Not Indicated Financial Strain Interventions: Intervention Not Indicated Housing Interventions: Intervention Not Indicated Transportation Interventions: Intervention Not Indicated Alcohol Brief Interventions/Follow-up: AUDIT Score <7 follow-up not indicated   Readmission Risk Interventions Readmission Risk Prevention Plan 12/27/2020  Transportation Screening Complete  PCP or Specialist Appt within 5-7 Days Complete  Home Care Screening Complete  Medication Review (RN CM) Referral to  Pharmacy  Some recent data might be hidden

## 2021-01-18 NOTE — TOC Progression Note (Addendum)
Transition of Care [facility]) - Progression Note    Patient Details  Name: Gary Mcneil MRN: [id number] Date of Birth: [birth year]  Transition of Care [facility]) CM/SW Contact  Erin Sons, [place] Phone Number: 01/18/2021, 9:19 AM  Clinical Narrative:     Pt son left voicemail notifying CSW they choose [facility]   CSW added facility to [facility] insurance auth. Talbot Grumbling is requesting updated PT note with pt participating before approving auth. CSW called acute rehab and requested PT to see pt.   1105 CSW faxed updated PT note to [employer] for SNF auth  130: CSW checked [employer] portal. Auth approved from 3/1 -- 3/3. [facility] can accept today.   Expected Discharge Plan: Skilled Nursing Facility    Expected Discharge Plan and Services Expected Discharge Plan: Skilled Nursing Facility                                               Social Determinants of Health (SDOH) Interventions Food Insecurity Interventions: Intervention Not Indicated Financial Strain Interventions: Intervention Not Indicated Housing Interventions: Intervention Not Indicated Transportation Interventions: Intervention Not Indicated Alcohol Brief Interventions/Follow-up: AUDIT Score <7 follow-up not indicated  Readmission Risk Interventions Readmission Risk Prevention Plan 12/27/2020  Transportation Screening Complete  PCP or Specialist Appt within 5-7 Days Complete  Home Care Screening Complete  Medication Review (RN CM) Referral to Pharmacy  Some recent data might be hidden

## 2021-01-18 NOTE — Progress Notes (Signed)
Physical Therapy Treatment Patient Details Name: Gary Mcneil MRN: [id number] DOB: 09/01/1941 Today's Date: 01/18/2021    History of Present Illness Gary Mcneil is a 80 y.o. male with medical history significant of dementia, HTN, DM2, CHF, heavy EtOH use / abuse. Pt recently admitted for acute hypoxic resp failure due to COVID-19 PNA and pulmonary embolism on 2/4.  Also noted to have acute CHF during that admit treated with dose of lasix. Pt admitted after being founddown on the floor and he appeared to be unconscious with hypoxia into the 60s. In ED: CT head and neck is neg. CTA chest: Poor contrast timing, lungs show increasingly confluent opacities compared to earlier this month.    PT Comments    Pt progressing towards goals. Pt pleasant and cooperative this session and agreeable to mobility tasks. Pt limited secondary to fatigue and SOB. Pt requiring min A to stand and take steps to chair this session. Oxygen sats decreasing to 83% on 3L, and required seated rest and pursed lip breathing to return to >90% on 3L. Current recommendations appropriate. Will continue to follow acutely.    Follow Up Recommendations  SNF;Supervision/Assistance - 24 hour     Equipment Recommendations  None recommended by PT    Recommendations for Other Services       Precautions / Restrictions Precautions Precautions: Fall Restrictions Weight Bearing Restrictions: No    Mobility  Bed Mobility Overal bed mobility: Needs Assistance Bed Mobility: Supine to Sit     Supine to sit: Supervision     General bed mobility comments: Supervision for safety. Increased time required.    Transfers Overall transfer level: Needs assistance Equipment used: 1 person hand held assist Transfers: Stand Pivot Transfers;Sit to/from Stand Sit to Stand: Min assist Stand pivot transfers: Min assist       General transfer comment: Min A for lift assist and steadying to stand and take steps to chair this  session. Pt with increased SOB. Sats at 83% on 3L following transfer and required seated rest and pursed lip breathing to return to >90% on 3L.  Ambulation/Gait                 Stairs             Wheelchair Mobility    Modified Rankin (Stroke Patients Only)       Balance Overall balance assessment: Needs assistance Sitting-balance support: No upper extremity supported;Feet supported Sitting balance-Leahy Scale: Fair     Standing balance support: Single extremity supported Standing balance-Leahy Scale: Poor Standing balance comment: Reliant on UE and external support                            Cognition Arousal/Alertness: Awake/alert Behavior During Therapy: WFL for tasks assessed/performed Overall Cognitive Status: History of cognitive impairments - at baseline                                 General Comments: Dementia at baseline. Overall pleasant and cooperative      Exercises      General Comments        Pertinent Vitals/Pain Pain Assessment: No/denies pain    Home Living                      Prior Function            PT  Goals (current goals can now be found in the care plan section) Acute Rehab PT Goals Patient Stated Goal: To get better PT Goal Formulation: With patient Time For Goal Achievement: 01/27/21 Potential to Achieve Goals: Good Progress towards PT goals: Progressing toward goals    Frequency    Min 2X/week      PT Plan Current plan remains appropriate    Co-evaluation              AM-PAC PT "6 Clicks" Mobility   Outcome Measure  Help needed turning from your back to your side while in a flat bed without using bedrails?: None Help needed moving from lying on your back to sitting on the side of a flat bed without using bedrails?: A Little Help needed moving to and from a bed to a chair (including a wheelchair)?: A Little Help needed standing up from a chair using your arms  (e.g., wheelchair or bedside chair)?: A Little Help needed to walk in hospital room?: A Little Help needed climbing 3-5 steps with a railing? : A Lot 6 Click Score: 18    End of Session Equipment Utilized During Treatment: Oxygen;Gait belt Activity Tolerance: Patient tolerated treatment well Patient left: in chair;with call bell/phone within reach;with chair alarm set Nurse Communication: Mobility status PT Visit Diagnosis: Unsteadiness on feet (R26.81);Muscle weakness (generalized) (M62.81);Other abnormalities of gait and mobility (R26.89)     Time: 8756-4332 PT Time Calculation (min) (ACUTE ONLY): 16 min  Charges:  $Therapeutic Activity: 8-22 mins                     Cindee Salt, DPT  Acute Rehabilitation Services  Pager: [phone] Office: [phone]    Gary Mcneil 01/18/2021, 10:55 AM

## 2021-01-29 ENCOUNTER — Encounter (HOSPITAL_COMMUNITY): Payer: Self-pay | Admitting: Emergency Medicine

## 2021-01-29 ENCOUNTER — Emergency Department (HOSPITAL_COMMUNITY): Payer: Medicare Other

## 2021-01-29 ENCOUNTER — Inpatient Hospital Stay (HOSPITAL_COMMUNITY)
Admission: EM | Admit: 2021-01-29 | Discharge: 2021-02-02 | DRG: 663 | Disposition: A | Discharge status: Skilled Nursing Facility | Payer: Medicare Other | Source: Skilled Nursing Facility | Attending: Internal Medicine | Admitting: Internal Medicine

## 2021-01-29 ENCOUNTER — Other Ambulatory Visit: Payer: Self-pay

## 2021-01-29 DIAGNOSIS — F1010 Alcohol abuse, uncomplicated: Secondary | ICD-10-CM | POA: Diagnosis present

## 2021-01-29 DIAGNOSIS — I10 Essential (primary) hypertension: Secondary | ICD-10-CM | POA: Diagnosis not present

## 2021-01-29 DIAGNOSIS — Z66 Do not resuscitate: Secondary | ICD-10-CM | POA: Diagnosis present

## 2021-01-29 DIAGNOSIS — E559 Vitamin D deficiency, unspecified: Secondary | ICD-10-CM | POA: Diagnosis present

## 2021-01-29 DIAGNOSIS — I5020 Unspecified systolic (congestive) heart failure: Secondary | ICD-10-CM

## 2021-01-29 DIAGNOSIS — D72829 Elevated white blood cell count, unspecified: Secondary | ICD-10-CM | POA: Diagnosis present

## 2021-01-29 DIAGNOSIS — E1140 Type 2 diabetes mellitus with diabetic neuropathy, unspecified: Secondary | ICD-10-CM | POA: Diagnosis present

## 2021-01-29 DIAGNOSIS — Z833 Family history of diabetes mellitus: Secondary | ICD-10-CM

## 2021-01-29 DIAGNOSIS — Z880 Allergy status to penicillin: Secondary | ICD-10-CM | POA: Diagnosis not present

## 2021-01-29 DIAGNOSIS — I110 Hypertensive heart disease with heart failure: Secondary | ICD-10-CM | POA: Diagnosis present

## 2021-01-29 DIAGNOSIS — N401 Enlarged prostate with lower urinary tract symptoms: Secondary | ICD-10-CM | POA: Diagnosis present

## 2021-01-29 DIAGNOSIS — N3289 Other specified disorders of bladder: Principal | ICD-10-CM | POA: Diagnosis present

## 2021-01-29 DIAGNOSIS — D696 Thrombocytopenia, unspecified: Secondary | ICD-10-CM | POA: Diagnosis present

## 2021-01-29 DIAGNOSIS — I5022 Chronic systolic (congestive) heart failure: Secondary | ICD-10-CM | POA: Diagnosis present

## 2021-01-29 DIAGNOSIS — F0390 Unspecified dementia without behavioral disturbance: Secondary | ICD-10-CM | POA: Diagnosis not present

## 2021-01-29 DIAGNOSIS — Z8249 Family history of ischemic heart disease and other diseases of the circulatory system: Secondary | ICD-10-CM

## 2021-01-29 DIAGNOSIS — Y92239 Unspecified place in hospital as the place of occurrence of the external cause: Secondary | ICD-10-CM | POA: Diagnosis not present

## 2021-01-29 DIAGNOSIS — N390 Urinary tract infection, site not specified: Secondary | ICD-10-CM | POA: Diagnosis present

## 2021-01-29 DIAGNOSIS — Z7901 Long term (current) use of anticoagulants: Secondary | ICD-10-CM | POA: Diagnosis not present

## 2021-01-29 DIAGNOSIS — Z87891 Personal history of nicotine dependence: Secondary | ICD-10-CM | POA: Diagnosis not present

## 2021-01-29 DIAGNOSIS — Z841 Family history of disorders of kidney and ureter: Secondary | ICD-10-CM

## 2021-01-29 DIAGNOSIS — N421 Congestion and hemorrhage of prostate: Secondary | ICD-10-CM | POA: Diagnosis present

## 2021-01-29 DIAGNOSIS — I2782 Chronic pulmonary embolism: Secondary | ICD-10-CM | POA: Diagnosis not present

## 2021-01-29 DIAGNOSIS — R319 Hematuria, unspecified: Principal | ICD-10-CM

## 2021-01-29 DIAGNOSIS — Z7984 Long term (current) use of oral hypoglycemic drugs: Secondary | ICD-10-CM | POA: Diagnosis not present

## 2021-01-29 DIAGNOSIS — F0150 Vascular dementia without behavioral disturbance: Secondary | ICD-10-CM | POA: Diagnosis not present

## 2021-01-29 DIAGNOSIS — R310 Gross hematuria: Secondary | ICD-10-CM

## 2021-01-29 DIAGNOSIS — K219 Gastro-esophageal reflux disease without esophagitis: Secondary | ICD-10-CM | POA: Diagnosis present

## 2021-01-29 DIAGNOSIS — E785 Hyperlipidemia, unspecified: Secondary | ICD-10-CM | POA: Diagnosis present

## 2021-01-29 DIAGNOSIS — Z20822 Contact with and (suspected) exposure to covid-19: Secondary | ICD-10-CM | POA: Diagnosis present

## 2021-01-29 DIAGNOSIS — N400 Enlarged prostate without lower urinary tract symptoms: Secondary | ICD-10-CM | POA: Diagnosis not present

## 2021-01-29 DIAGNOSIS — R102 Pelvic and perineal pain: Secondary | ICD-10-CM

## 2021-01-29 DIAGNOSIS — Z7189 Other specified counseling: Secondary | ICD-10-CM | POA: Diagnosis not present

## 2021-01-29 DIAGNOSIS — E1165 Type 2 diabetes mellitus with hyperglycemia: Secondary | ICD-10-CM | POA: Diagnosis present

## 2021-01-29 DIAGNOSIS — W19XXXA Unspecified fall, initial encounter: Secondary | ICD-10-CM | POA: Diagnosis not present

## 2021-01-29 DIAGNOSIS — Z86711 Personal history of pulmonary embolism: Secondary | ICD-10-CM | POA: Diagnosis not present

## 2021-01-29 DIAGNOSIS — I2609 Other pulmonary embolism with acute cor pulmonale: Secondary | ICD-10-CM | POA: Diagnosis not present

## 2021-01-29 DIAGNOSIS — Z79899 Other long term (current) drug therapy: Secondary | ICD-10-CM | POA: Diagnosis not present

## 2021-01-29 DIAGNOSIS — R338 Other retention of urine: Secondary | ICD-10-CM | POA: Diagnosis present

## 2021-01-29 DIAGNOSIS — E876 Hypokalemia: Secondary | ICD-10-CM

## 2021-01-29 DIAGNOSIS — Z515 Encounter for palliative care: Secondary | ICD-10-CM | POA: Diagnosis not present

## 2021-01-29 LAB — BASIC METABOLIC PANEL
Anion gap: 10 (ref 5–15)
BUN: 33 mg/dL — ABNORMAL HIGH (ref 8–23)
CO2: 21 mmol/L — ABNORMAL LOW (ref 22–32)
Calcium: 8.4 mg/dL — ABNORMAL LOW (ref 8.9–10.3)
Chloride: 103 mmol/L (ref 98–111)
Creatinine, Ser: 1.25 mg/dL — ABNORMAL HIGH (ref 0.61–1.24)
GFR, Estimated: 59 mL/min — ABNORMAL LOW (ref >60)
Glucose, Bld: 189 mg/dL — ABNORMAL HIGH (ref 70–99)
Potassium: 4.8 mmol/L (ref 3.5–5.1)
Sodium: 134 mmol/L — ABNORMAL LOW (ref 135–145)

## 2021-01-29 LAB — CBC WITH DIFFERENTIAL/PLATELET
Abs Immature Granulocytes: 0.71 10*3/uL — ABNORMAL HIGH (ref 0.00–0.07)
Basophils Absolute: 0.1 10*3/uL (ref 0.0–0.1)
Basophils Relative: 1 %
Eosinophils Absolute: 0.1 10*3/uL (ref 0.0–0.5)
Eosinophils Relative: 1 %
HCT: 38.1 % — ABNORMAL LOW (ref 39.0–52.0)
Hemoglobin: 12.5 g/dL — ABNORMAL LOW (ref 13.0–17.0)
Immature Granulocytes: 4 %
Lymphocytes Relative: 13 %
Lymphs Abs: 2.6 10*3/uL (ref 0.7–4.0)
MCH: 29 pg (ref 26.0–34.0)
MCHC: 32.8 g/dL (ref 30.0–36.0)
MCV: 88.4 fL (ref 80.0–100.0)
Monocytes Absolute: 1.6 10*3/uL — ABNORMAL HIGH (ref 0.1–1.0)
Monocytes Relative: 8 %
Neutro Abs: 14.5 10*3/uL — ABNORMAL HIGH (ref 1.7–7.7)
Neutrophils Relative %: 73 %
Platelets: 58 10*3/uL — ABNORMAL LOW (ref 150–400)
RBC: 4.31 MIL/uL (ref 4.22–5.81)
RDW: 16 % — ABNORMAL HIGH (ref 11.5–15.5)
WBC: 19.7 10*3/uL — ABNORMAL HIGH (ref 4.0–10.5)
nRBC: 0.2 % (ref 0.0–0.2)

## 2021-01-29 LAB — PROTIME-INR
INR: 1.3 — ABNORMAL HIGH (ref 0.8–1.2)
Prothrombin Time: 15.7 seconds — ABNORMAL HIGH (ref 11.4–15.2)

## 2021-01-29 LAB — URINALYSIS, ROUTINE W REFLEX MICROSCOPIC
Bilirubin Urine: NEGATIVE
Glucose, UA: NEGATIVE mg/dL
Ketones, ur: NEGATIVE mg/dL
Nitrite: NEGATIVE
Protein, ur: 30 mg/dL — AB
Specific Gravity, Urine: 1.01 (ref 1.005–1.030)
pH: 5.5 (ref 5.0–8.0)

## 2021-01-29 LAB — URINALYSIS, MICROSCOPIC (REFLEX): RBC / HPF: >50 RBC/hpf (ref 0–5)

## 2021-01-29 MED ORDER — FENTANYL CITRATE (PF) 100 MCG/2ML IJ SOLN
50.0000 ug | Freq: Once | INTRAMUSCULAR | Status: AC
  Administered 2021-01-29: 50 ug via INTRAVENOUS
  Filled 2021-01-29: qty 2
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
## 2021-01-29 NOTE — H&P (Signed)
History and Physical    Gary Mcneil FAO:[id number] DOB: [birth year] DOA: 01/29/2021  PCP: Har[place], MD   Patient coming from: SNF  Chief Complaint: blood in urine, pelvic pain  HPI: Gary Mcneil is a 80 y.o. male with medical history significant for dementia, HTN, DM2, HFrEF with EF 30-35% as of earlier this month, heavy EtOH use / abuse who was admitted last month with COVID-19 and PE. Is on Eliquis. He was initially admitted in early Feb and then readmitted on 01/13/21 for respiratory failure and during that admission developed urinary retention and required foley to be placed by urology. Foley was removed during outpatient follow up with urology but was subsequently replaced in the SNF.  Foley catheter was removed this afternoon at the SNF. He was sent to hospital by SNF with complaint of dark red urine in foley bag. Pt has been complaining of pain right lower abdomen and pelvic region for the past few days. He has not had fever, nausea or vomiting.   ED Course: Patient was seen by urology in the emergency room and Foley catheter was placed placed on continuous bladder irrigation.  Patient did have clots that were removed by urology during Foley insertion.  Review of Systems:  Review of system cannot be obtained secondary to dementia.  Reports a week history of pelvic pain and having blood in his foley bag  Past Medical History:  Diagnosis Date  . "walking corpse" syndrome   . Acute kidney failure, unspecified (HCC)   . Acute kidney failure, unspecified (HCC)   . Acute on chronic combined systolic and diastolic CHF (congestive heart failure) (HCC) 01/12/2021  . Anemia, unspecified   . Arthritis   . Dementia   . Dementia (HCC) 04/24/2012  . Disorder of bone and cartilage, unspecified   . Dizziness and giddiness   . GERD (gastroesophageal reflux disease)   . Gout   . Gout, unspecified   . Hypertension   . Hypopotassemia   . Hypotension, unspecified   . Hypotension,  unspecified   . Memory loss   . Memory loss   . Muscle weakness (generalized)   . Myalgia and myositis, unspecified   . Neuromuscular disorder (HCC)   . Other and unspecified hyperlipidemia   . Screening for ischemic heart disease   . Tobacco use disorder   . Type II or unspecified type diabetes mellitus without mention of complication, uncontrolled   . Type II or unspecified type diabetes mellitus without mention of complication, uncontrolled   . Unspecified hereditary and idiopathic peripheral neuropathy   . Unspecified vitamin D deficiency     Past Surgical History:  Procedure Laterality Date  . INGUINAL HERNIA REPAIR    . VIDEO ASSISTED THORACOSCOPY (VATS)/EMPYEMA Right 04/30/2015   Procedure: VIDEO ASSISTED THORACOSCOPY (VATS)/DRAIN EMPYEMA;  Surgeon: [name], MD;  Location: [facility] OR;  Service: Thoracic;  Laterality: Right;  [name] VIDEO BRONCHOSCOPY N/A 04/30/2015   Procedure: VIDEO BRONCHOSCOPY;  Surgeon: [name], MD;  Location: [facility] OR;  Service: Thoracic;  Laterality: N/A;    Social History  reports that he quit smoking about 51 years ago. He has never used smokeless tobacco. He reports current alcohol use. He reports previous drug use. Drug: Marijuana.  Allergies  Allergen Reactions  . Penicillins Rash    Family History  Problem Relation Age of Onset  . Diabetes Mother   . Hypertension Mother   . Diabetes Father   . Hypertension Father   . Kidney  disease Father   . Diabetes Sister   . Kidney disease Brother      Prior to Admission medications   Medication Sig Start Date End Date Taking? Authorizing Provider  albuterol (VENTOLIN HFA) 108 (90 Base) MCG/ACT inhaler Inhale 2 puffs into the lungs every 6 (six) hours. Patient taking differently: Inhale 2 puffs into the lungs in the morning and at bedtime. 12/30/20   Ghimire, Werner Lean, MD  allopurinol (ZYLOPRIM) 300 MG tablet Take 300 mg by mouth daily.    [provider]  apixaban (ELIQUIS) 5 MG TABS  tablet Take 1 tablet (5 mg total) by mouth 2 (two) times daily. 01/18/21   Azucena Fallen, MD  atorvastatin (LIPITOR) 40 MG tablet Take 1 tablet (40 mg total) by mouth at bedtime. 12/30/20   Ghimire, Werner Lean, MD  benzonatate (TESSALON PERLES) 100 MG capsule Take 1 capsule (100 mg total) by mouth 3 (three) times daily as needed for cough. 12/30/20 12/30/21  GhimireWerner Lean, MD  folic acid (FOLVITE) 1 MG tablet Take 1 tablet (1 mg total) by mouth daily. 12/31/20   Ghimire, Werner Lean, MD  furosemide (LASIX) 40 MG tablet Take 1 tablet (40 mg total) by mouth daily. 01/18/21   Azucena Fallen, MD  linagliptin (TRADJENTA) 5 MG TABS tablet Take 5 mg by mouth daily.    [provider]  loperamide HCl (IMODIUM) 1 MG/7.5ML suspension Take 15 mLs (2 mg total) by mouth as needed for diarrhea or loose stools. 01/18/21   Azucena Fallen, MD  losartan (COZAAR) 100 MG tablet Take 0.5 tablets (50 mg total) by mouth daily. 01/19/21   Azucena Fallen, MD  Memantine HCl-Donepezil HCl [facility]) 28-10 MG CP24 Take 1 capsule by mouth daily. 06/10/19   [name], NP  metFORMIN (GLUCOPHAGE) 500 MG tablet TAKE 1 TABLET BY MOUTH EVERY DAY WITH BREAKFAST Patient taking differently: Take 500 mg by mouth daily with breakfast. TAKE 1 TABLET BY MOUTH EVERY DAY WITH BREAKFAST 06/10/19   [name], NP  metoprolol succinate (TOPROL-XL) 50 MG 24 hr tablet Take 1 tablet (50 mg total) by mouth daily. Take with or immediately following a meal. 12/31/20   Ghimire, Werner Lean, MD  Multiple Vitamin (MULTIVITAMIN WITH MINERALS) TABS tablet Take 1 tablet by mouth daily.    [provider]  pantoprazole (PROTONIX) 40 MG tablet Take 1 tablet (40 mg total) by mouth daily at 12 noon. 12/30/20   Ghimire, Werner Lean, MD  potassium chloride SA (KLOR-CON) 20 MEQ tablet Take 3 tablets (60 mEq total) by mouth daily. 11/20/19   [name], NP  thiamine 100 MG tablet Take 1 tablet (100 mg total) by mouth  daily. 12/31/20   Maretta Bees, MD    Physical Exam: Vitals:   01/29/21 1915 01/29/21 1930 01/29/21 1945 01/29/21 2045  BP: 90/63 99/62 100/66 105/76  Pulse: 93 91 92 93  Resp: (!) 22 20 20  (!) 35  Temp:      SpO2: 100% 99% 95% 99%    Constitutional: NAD, calm, comfortable Vitals:   01/29/21 1915 01/29/21 1930 01/29/21 1945 01/29/21 2045  BP: 90/63 99/62 100/66 105/76  Pulse: 93 91 92 93  Resp: (!) 22 20 20  (!) 35  Temp:      SpO2: 100% 99% 95% 99%   General: WDWN, Alert and oriented to self and place.  Eyes: EOMI, PERRL, conjunctivae normal.  Sclera nonicteric HENT:  [place]/AT, external ears normal.  Nares patent  without epistasis.  Mucous membranes are moist.  Neck: Soft, normal range of motion, supple, no masses, no thyromegaly.  Trachea midline Respiratory: clear to auscultation bilaterally, no wheezing, no crackles. Normal respiratory effort. No accessory muscle use.  Cardiovascular: Regular rate and rhythm, no murmurs / rubs / gallops. No extremity edema. Abdomen: Soft, RLQ and groin tenderness to palpation, nondistended, no rebound or guarding. No flank pain.No masses palpated. No hepatosplenomegaly. Bowel sounds normoactive Musculoskeletal: FROM. no cyanosis. Normal muscle tone.  Skin: Warm, dry, intact no rashes, lesions, ulcers. No induration Neurologic: Normal speech.  Sensation intact, Strength 4/5 in all extremities.   Psychiatric: Normal mood.    Labs on Admission: I have personally reviewed following labs and imaging studies  CBC: Recent Labs  Lab 01/29/21 1850  WBC 19.7*  NEUTROABS 14.5*  HGB 12.5*  HCT 38.1*  MCV 88.4  PLT 58*    Basic Metabolic Panel: Recent Labs  Lab 01/29/21 1850  NA 134*  K 4.8  CL 103  CO2 21*  GLUCOSE 189*  BUN 33*  CREATININE 1.25*  CALCIUM 8.4*    GFR: Estimated Creatinine Clearance: 51.8 mL/min (A) (by C-G formula based on SCr of 1.25 mg/dL (H)).  Liver Function Tests: No results for input(s): AST, ALT,  ALKPHOS, BILITOT, PROT, ALBUMIN in the last 168 hours.  Urine analysis:    Component Value Date/Time   COLORURINE AMBER (A) 04/29/2015 1030   APPEARANCEUR Clear 05/05/2016 1554   LABSPEC 1.024 04/29/2015 1030   PHURINE 6.0 04/29/2015 1030   GLUCOSEU 1+ (A) 05/05/2016 1554   HGBUR NEGATIVE 04/29/2015 1030   BILIRUBINUR Negative 05/05/2016 1554   KETONESUR NEGATIVE 04/29/2015 1030   PROTEINUR Trace 05/05/2016 1554   PROTEINUR NEGATIVE 04/29/2015 1030   UROBILINOGEN 1.0 04/29/2015 1030   NITRITE Negative 05/05/2016 1554   NITRITE NEGATIVE 04/29/2015 1030   LEUKOCYTESUR Negative 05/05/2016 1554    Radiological Exams on Admission: US PELVIS (TRANSABDOMINAL ONLY)  Result Date: 01/29/2021 CLINICAL DATA:  Gross hematuria. EXAM: PELVIC ULTRASOUND COMPLETE COMPARISON:  None. FINDINGS: Bladder: A Foley catheter is seen within the urinary bladder which measures 3.6 cm x 2.5 cm x 8.3 cm (61.8 cm3). A mild-to-moderate amount of layering heterogeneous echogenic debris is seen within the dependent portion of the bladder lumen. This area measures approximately 8.3 cm x 5.6 cm x 2.5 cm. IMPRESSION: Echogenic debris within the urinary bladder, likely consistent with residual blood products. Electronically Signed   By: Aram Candelahaddeus  Houston M.D.   On: 01/29/2021 22:04   US Renal  Result Date: 01/29/2021 CLINICAL DATA:  Urinary retention, evaluate for bladder clot EXAM: RENAL / URINARY TRACT ULTRASOUND COMPLETE COMPARISON:  None. FINDINGS: Right Kidney: Renal measurements: 10.5 x 5.8 x 5.4 cm = volume: 170 mL. Echogenicity within normal limits. Simple cysts. No mass or hydronephrosis visualized. Left Kidney: Renal measurements: 11.1 x 3 2 x 5.4 cm = volume: 193 mL. Echogenicity within normal limits. No mass or hydronephrosis visualized. Bladder: Large volume of clot and debris within the urinary bladder. Prevoid volume 931 ml. Other: None. IMPRESSION: 1. Large volume of clot and debris within the urinary bladder.  Bladder volume 931 mL. 2.  No hydronephrosis. Electronically Signed   By: Lauralyn PrimesAlex  Bibbey M.D.   On: 01/29/2021 19:50    Assessment/Plan Principal Problem:   Hematuria Mr. Julious OkaLilly will be admitted to MedSurg floor.  He has been evaluated by urology and Foley catheter has been replaced.  He is on continuous bladder irrigation which are being managed by  urology. Differential diagnosis includes UTI, urolithiasis, bladder neoplasm.  Renal ultrasound shows no hydronephrosis but there were clots present in the bladder. Obtain CT urogram to rule out kidney stone Patient may need cystoscopy and further work-up by urology.  Urology will continue to follow  Active Problems:   Type 2 diabetes mellitus with diabetic neuropathy  Continue home medication Metformin and Tradjenta Monitor blood sugars with meals and bedtime.  Sliding scale insulin provided as needed for glycemic control.  Patient had hemoglobin A1c last month so would not be repeated now    Essential hypertension Continue home medications. Monitor BP    Leukocytosis WBC 19,000.  Awaiting UA and ordered CXR     Thrombocytopenia Platelet count 58000. Transfuse platelets if drops below 50,000 in setting of hematuria    HFrEF (heart failure with reduced ejection fraction)  Stable    Pelvic pain Pain control provided. Dilaudid prn ordered    Dementia without behavioral disturbance  Chronic, stable. Continue home meds     DVT prophylaxis: Patient has been on Eliquis for the last month.  We will hold tonight's dose with hematuria and thrombocytopenia Code Status:   Full code Disposition Plan:   Patient is from:  SNF  Anticipated DC to:  SNF  Anticipated DC date:  Anticipate 2 midnight stay in hospital   Consults called:  Urology, has been seen by Dr. Lindajo Royal in ER.  Admission status:  Inpatient   Claudean Severance Allisyn Kunz MD [facility]  How to contact the [facility] Attending or Consulting provider 7A - 7P or covering provider  during after hours 7P -7A, for this patient?   1. Check the care team in [facility] and look for a) attending/consulting [facility] provider listed and b) the [facility] team listed 2. Log into [email or web address] and use [place]'s universal password to access. If you do not have the password, please contact the hospital operator. 3. Locate the [facility] provider you are looking for under [facility] and page to a number that you can be directly reached. 4. If you still have difficulty reaching the provider, please page the [facility] (Director on Call) for the Hospitalists listed on amion for assistance.  01/29/2021, 10:19 PM

## 2021-01-29 NOTE — ED Notes (Signed)
Attempted foley placement, unsuccessful, provider notified.

## 2021-01-29 NOTE — ED Provider Notes (Signed)
[facility] EMERGENCY DEPARTMENT Provider Note   CSN: [id number] Arrival date & time: 01/29/21  1555     History Chief Complaint  Patient presents with  . blood in foley    Gary Mcneil is a 80 y.o. male history of dementia, hypertension, diabetes, CHF, alcohol abuse, presenting to the ED from SNF for hematuria.  Per review of medical record, he was admitted in early February and again on 222.  He initially was admitted for acute hypoxia in the setting of COVID-19 pneumonia and PE on 24 February. Pt is on eliquis for PE.  He was admitted again on February 22 with heart failure exacerbation for IV diuresis.  During his admission it appears he developed urinary retention.  Foley catheter was difficult to place, urology was consulted.  Per Dr. Estil Daft note, it appears there was quite a bit of blood present, Foley was placed with assistance of cystoscopy.  He was placed on CBI with improvement in hematuria and resolution at discharge.    Per urology resident Dr. Lindajo Royal, she reports patient was seen outpatient after his discharge and Foley was removed, however it appears he needed it replaced in the SNF.  He is presenting today from his SNF with large blood clots in the Foley catheter which eventually caused obstruction.  Per EMS, the SNF removed the Foley prior to transfer to the ED.  Patient is endorsing lower abdominal discomfort, worse in his right groin.  The history is provided by medical records and the patient.       Past Medical History:  Diagnosis Date  . "walking corpse" syndrome   . Acute kidney failure, unspecified (HCC)   . Acute kidney failure, unspecified (HCC)   . Acute on chronic combined systolic and diastolic CHF (congestive heart failure) (HCC) 01/12/2021  . Anemia, unspecified   . Arthritis   . Dementia   . Dementia (HCC) 04/24/2012  . Disorder of bone and cartilage, unspecified   . Dizziness and giddiness   . GERD (gastroesophageal reflux  disease)   . Gout   . Gout, unspecified   . Hypertension   . Hypopotassemia   . Hypotension, unspecified   . Hypotension, unspecified   . Memory loss   . Memory loss   . Muscle weakness (generalized)   . Myalgia and myositis, unspecified   . Neuromuscular disorder (HCC)   . Other and unspecified hyperlipidemia   . Screening for ischemic heart disease   . Tobacco use disorder   . Type II or unspecified type diabetes mellitus without mention of complication, uncontrolled   . Type II or unspecified type diabetes mellitus without mention of complication, uncontrolled   . Unspecified hereditary and idiopathic peripheral neuropathy   . Unspecified vitamin D deficiency     Patient Active Problem List   Diagnosis Date Noted  . Hematuria 01/29/2021  . Leukocytosis 01/29/2021  . Thrombocytopenia (HCC) 01/29/2021  . HFrEF (heart failure with reduced ejection fraction) (HCC) 01/29/2021  . Pelvic pain 01/29/2021  . Acute on chronic combined systolic and diastolic CHF (congestive heart failure) (HCC) 01/12/2021  . Alcohol use 01/12/2021  . 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 Acute hypoxemic respiratory failure due to COVID-19 (HCC) 12/24/2020  . Pulmonary embolism (HCC) 12/24/2020  . Tobacco use disorder   . Hypertension   . GERD (gastroesophageal reflux disease)   . Acute kidney failure, unspecified (HCC)   . 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  Empyema lung (HCC) 04/30/2015  . Loculated pleural effusion   . Hyperglycemia   . Right lower lobe lung mass   . Lung mass 04/24/2015  . Lethargy   . Essential hypertension   . HLD (hyperlipidemia)   . Right-sided chest pain 04/20/2015  . Transaminitis 04/20/2015  . Positive D dimer 04/20/2015  . Acute respiratory failure with hypoxia (HCC) 04/20/2015  . Pleural effusion, right 04/20/2015  . Hypokalemia 04/20/2015  . Dementia without behavioral disturbance (HCC)  09/02/2014  . Type 2 diabetes mellitus with diabetic neuropathy (HCC) 05/20/2014  . Gout 04/29/2014  . Elevated liver enzymes 01/27/2014  . Anemia 05/02/2012  . Constipation 04/24/2012  . Dementia (HCC) 04/24/2012  . Arthritis   . HTN (hypertension) 04/13/2012  . Hyperlipidemia 04/13/2012    Past Surgical History:  Procedure Laterality Date  . INGUINAL HERNIA REPAIR    . VIDEO ASSISTED THORACOSCOPY (VATS)/EMPYEMA Right 04/30/2015   Procedure: VIDEO ASSISTED THORACOSCOPY (VATS)/DRAIN EMPYEMA;  Surgeon: [name], MD;  Location: [facility] OR;  Service: Thoracic;  Laterality: Right;  [name] VIDEO BRONCHOSCOPY N/A 04/30/2015   Procedure: VIDEO BRONCHOSCOPY;  Surgeon: [name], MD;  Location: [facility] OR;  Service: Thoracic;  Laterality: N/A;       Family History  Problem Relation Age of Onset  . Diabetes Mother   . Hypertension Mother   . Diabetes Father   . Hypertension Father   . Kidney disease Father   . Diabetes Sister   . Kidney disease Brother     Social History   Tobacco Use  . Smoking status: Former Smoker    Quit date: 11/20/1969    Years since quitting: 51.2  . Smokeless tobacco: Never Used  Vaping Use  . Vaping Use: Never used  Substance Use Topics  . Alcohol use: Yes    Comment: daily- to every other day, combination or beer, wine, and liquor.per son patient drinks beer and liquor heavy  . Drug use: Not Currently    Types: Marijuana    Home Medications Prior to Admission medications   Medication Sig Start Date End Date Taking? Authorizing Provider  albuterol (VENTOLIN HFA) 108 (90 Base) MCG/ACT inhaler Inhale 2 puffs into the lungs every 6 (six) hours. Patient taking differently: Inhale 2 puffs into the lungs in the morning and at bedtime. 12/30/20   Ghimire, Werner Lean, MD  allopurinol (ZYLOPRIM) 300 MG tablet Take 300 mg by mouth daily.    [provider]  apixaban (ELIQUIS) 5 MG TABS tablet Take 1 tablet (5 mg total) by mouth 2 (two) times daily.  01/18/21   Azucena Fallen, MD  atorvastatin (LIPITOR) 40 MG tablet Take 1 tablet (40 mg total) by mouth at bedtime. 12/30/20   Ghimire, Werner Lean, MD  benzonatate (TESSALON PERLES) 100 MG capsule Take 1 capsule (100 mg total) by mouth 3 (three) times daily as needed for cough. 12/30/20 12/30/21  GhimireWerner Lean, MD  folic acid (FOLVITE) 1 MG tablet Take 1 tablet (1 mg total) by mouth daily. 12/31/20   Ghimire, Werner Lean, MD  furosemide (LASIX) 40 MG tablet Take 1 tablet (40 mg total) by mouth daily. 01/18/21   Azucena Fallen, MD  linagliptin (TRADJENTA) 5 MG TABS tablet Take 5 mg by mouth daily.    [provider]  loperamide HCl (IMODIUM) 1 MG/7.5ML suspension Take 15 mLs (2 mg total) by mouth as needed for diarrhea or loose stools. 01/18/21   Azucena Fallen, MD  losartan (COZAAR) 100  MG tablet Take 0.5 tablets (50 mg total) by mouth daily. 01/19/21   Azucena FallenLancaster, William C, MD  Memantine HCl-Donepezil HCl [facility](NAMZARIC) 28-10 MG CP24 Take 1 capsule by mouth daily. 06/10/19   Sharon SellerEubanks, Jessica K, NP  metFORMIN (GLUCOPHAGE) 500 MG tablet TAKE 1 TABLET BY MOUTH EVERY DAY WITH BREAKFAST Patient taking differently: Take 500 mg by mouth daily with breakfast. TAKE 1 TABLET BY MOUTH EVERY DAY WITH BREAKFAST 06/10/19   Sharon SellerEubanks, Jessica K, NP  metoprolol succinate (TOPROL-XL) 50 MG 24 hr tablet Take 1 tablet (50 mg total) by mouth daily. Take with or immediately following a meal. 12/31/20   Ghimire, Werner LeanShanker M, MD  Multiple Vitamin (MULTIVITAMIN WITH MINERALS) TABS tablet Take 1 tablet by mouth daily.    [provider]  pantoprazole (PROTONIX) 40 MG tablet Take 1 tablet (40 mg total) by mouth daily at 12 noon. 12/30/20   Ghimire, Werner LeanShanker M, MD  potassium chloride SA (KLOR-CON) 20 MEQ tablet Take 3 tablets (60 mEq total) by mouth daily. 11/20/19   Sharon SellerEubanks, Jessica K, NP  thiamine 100 MG tablet Take 1 tablet (100 mg total) by mouth daily. 12/31/20   Ghimire, Werner LeanShanker M, MD    Allergies     Penicillins  Review of Systems   Review of Systems  Gastrointestinal: Positive for abdominal distention and abdominal pain.  Genitourinary: Positive for difficulty urinating and hematuria.  Hematological: Bruises/bleeds easily.  All other systems reviewed and are negative.   Physical Exam Updated Vital Signs BP 105/76   Pulse 93   Temp 97.8 F (36.6 C)   Resp (!) 35   SpO2 99%   Physical Exam Vitals and nursing note reviewed.  Constitutional:      Appearance: He is well-developed.  HENT:     Head: Normocephalic and atraumatic.  Eyes:     Conjunctiva/sclera: Conjunctivae normal.  Cardiovascular:     Rate and Rhythm: Normal rate and regular rhythm.  Pulmonary:     Effort: Pulmonary effort is normal. No respiratory distress.  Abdominal:     General: Bowel sounds are normal. There is distension (lower abdomen is distended).     Palpations: Abdomen is soft.     Tenderness: There is abdominal tenderness.  Genitourinary:    Comments: Frank blood at the urethral meatus. RN chaperone present Skin:    General: Skin is warm.  Neurological:     Mental Status: He is alert.  Psychiatric:        Behavior: Behavior normal.     ED Results / Procedures / Treatments   Labs (all labs ordered are listed, but only abnormal results are displayed) Labs Reviewed  BASIC METABOLIC PANEL - Abnormal; Notable for the following components:      Result Value   Sodium 134 (*)    CO2 21 (*)    Glucose, Bld 189 (*)    BUN 33 (*)    Creatinine, Ser 1.25 (*)    Calcium 8.4 (*)    GFR, Estimated 59 (*)    All other components within normal limits  CBC WITH DIFFERENTIAL/PLATELET - Abnormal; Notable for the following components:   WBC 19.7 (*)    Hemoglobin 12.5 (*)    HCT 38.1 (*)    RDW 16.0 (*)    Platelets 58 (*)    Neutro Abs 14.5 (*)    Monocytes Absolute 1.6 (*)    Abs Immature Granulocytes 0.71 (*)    All other components within normal limits  URINE CULTURE  PROTIME-INR  URINALYSIS, ROUTINE W REFLEX MICROSCOPIC    EKG None  Radiology US PELVIS (TRANSABDOMINAL ONLY)  Result Date: 01/29/2021 CLINICAL DATA:  Gross hematuria. EXAM: PELVIC ULTRASOUND COMPLETE COMPARISON:  None. FINDINGS: Bladder: A Foley catheter is seen within the urinary bladder which measures 3.6 cm x 2.5 cm x 8.3 cm (61.8 cm3). A mild-to-moderate amount of layering heterogeneous echogenic debris is seen within the dependent portion of the bladder lumen. This area measures approximately 8.3 cm x 5.6 cm x 2.5 cm. IMPRESSION: Echogenic debris within the urinary bladder, likely consistent with residual blood products. Electronically Signed   By: Aram Candela M.D.   On: 01/29/2021 22:04   US Renal  Result Date: 01/29/2021 CLINICAL DATA:  Urinary retention, evaluate for bladder clot EXAM: RENAL / URINARY TRACT ULTRASOUND COMPLETE COMPARISON:  None. FINDINGS: Right Kidney: Renal measurements: 10.5 x 5.8 x 5.4 cm = volume: 170 mL. Echogenicity within normal limits. Simple cysts. No mass or hydronephrosis visualized. Left Kidney: Renal measurements: 11.1 x 3 2 x 5.4 cm = volume: 193 mL. Echogenicity within normal limits. No mass or hydronephrosis visualized. Bladder: Large volume of clot and debris within the urinary bladder. Prevoid volume 931 ml. Other: None. IMPRESSION: 1. Large volume of clot and debris within the urinary bladder. Bladder volume 931 mL. 2.  No hydronephrosis. Electronically Signed   By: Lauralyn Primes M.D.   On: 01/29/2021 19:50    Procedures Procedures   Medications Ordered in ED Medications  fentaNYL (SUBLIMAZE) injection 50 mcg (50 mcg Intravenous Given 01/29/21 2035)    ED Course  I have reviewed the triage vital signs and the nursing notes.  Pertinent labs & imaging results that were available during my care of the patient were reviewed by me and considered in my medical decision making (see chart for details).  Clinical Course as of 01/29/21 2214  Sat Jan 29, 2021   1836 Dr. Margette Fast with urology consulted. Does recommend nursing attempt coude, will also obtain US to evaluate for large clot burden in the bladder. Recommends 18Fr 2-way coude. [phone] [JR]  2103 Foley successfully placed by urology, placed on CBI. Urology requests medical admission. Will continue to consult in the morning. [JR]  2144 Dr. Rachael Darby accepting admission [JR]  2214 Platelets(!): 58 low [JR]  2214 Hemoglobin(!): 12.5 stable [JR]    Clinical Course User Index [JR] Eliza Green, [place] N, PA-C   MDM Rules/Calculators/A&P                          Patient presenting from SNF for hematuria and blood clots causing obstructed Foley catheter.  Facility apparently removed the Foley prior to transfer to the ED.  Patient is on Eliquis for recent PE last month in the setting of COVID-19 illness.  He has frank blood at the urethral meatus on examination, lower abdomen is distended and tender.  Bladder scan reveals greater than 600 cc.  Labs reveal stable hemoglobin of 12.5, new leukocytosis, platelets are significantly low at 58.    Nursing had difficulty placing Foley catheter, likely due to BPH.  Urology was consulted and was able to successfully place Foley, continuous bladder irrigation was started.  Urology requests medical admission.  Dr. Rachael Darby accepting medical admission.  Final Clinical Impression(s) / ED Diagnoses Final diagnoses:  Gross hematuria    Rx / DC Orders ED Discharge Orders    None       Autymn Omlor, [place] N, PA-C 01/29/21 2216  Wynetta Fines, MD 01/30/21 1455

## 2021-01-29 NOTE — ED Triage Notes (Signed)
Pt to triage via [facility] from [facility].  Staff reports blood with clots in foley today.  Pt reports R groin pain since yesterday.

## 2021-01-29 NOTE — ED Notes (Signed)
U/s at bedside

## 2021-01-30 ENCOUNTER — Encounter (HOSPITAL_COMMUNITY): Payer: Self-pay | Admitting: Certified Registered"

## 2021-01-30 DIAGNOSIS — Z7189 Other specified counseling: Secondary | ICD-10-CM

## 2021-01-30 DIAGNOSIS — I10 Essential (primary) hypertension: Secondary | ICD-10-CM | POA: Diagnosis not present

## 2021-01-30 DIAGNOSIS — F0390 Unspecified dementia without behavioral disturbance: Secondary | ICD-10-CM

## 2021-01-30 DIAGNOSIS — R319 Hematuria, unspecified: Secondary | ICD-10-CM | POA: Diagnosis not present

## 2021-01-30 DIAGNOSIS — R338 Other retention of urine: Secondary | ICD-10-CM

## 2021-01-30 DIAGNOSIS — E1140 Type 2 diabetes mellitus with diabetic neuropathy, unspecified: Secondary | ICD-10-CM

## 2021-01-30 DIAGNOSIS — I5020 Unspecified systolic (congestive) heart failure: Secondary | ICD-10-CM

## 2021-01-30 DIAGNOSIS — I2609 Other pulmonary embolism with acute cor pulmonale: Secondary | ICD-10-CM

## 2021-01-30 DIAGNOSIS — N401 Enlarged prostate with lower urinary tract symptoms: Secondary | ICD-10-CM

## 2021-01-30 DIAGNOSIS — N400 Enlarged prostate without lower urinary tract symptoms: Secondary | ICD-10-CM

## 2021-01-30 DIAGNOSIS — I2782 Chronic pulmonary embolism: Secondary | ICD-10-CM

## 2021-01-30 DIAGNOSIS — I5022 Chronic systolic (congestive) heart failure: Secondary | ICD-10-CM

## 2021-01-30 LAB — BASIC METABOLIC PANEL
Anion gap: 7 (ref 5–15)
BUN: 34 mg/dL — ABNORMAL HIGH (ref 8–23)
CO2: 26 mmol/L (ref 22–32)
Calcium: 8.8 mg/dL — ABNORMAL LOW (ref 8.9–10.3)
Chloride: 106 mmol/L (ref 98–111)
Creatinine, Ser: 1.09 mg/dL (ref 0.61–1.24)
GFR, Estimated: >60 mL/min (ref >60)
Glucose, Bld: 177 mg/dL — ABNORMAL HIGH (ref 70–99)
Potassium: 4.7 mmol/L (ref 3.5–5.1)
Sodium: 139 mmol/L (ref 135–145)

## 2021-01-30 LAB — CBC
HCT: 32 % — ABNORMAL LOW (ref 39.0–52.0)
Hemoglobin: 11.3 g/dL — ABNORMAL LOW (ref 13.0–17.0)
MCH: 29.5 pg (ref 26.0–34.0)
MCHC: 35.3 g/dL (ref 30.0–36.0)
MCV: 83.6 fL (ref 80.0–100.0)
Platelets: 58 10*3/uL — ABNORMAL LOW (ref 150–400)
RBC: 3.83 MIL/uL — ABNORMAL LOW (ref 4.22–5.81)
RDW: 15.5 % (ref 11.5–15.5)
WBC: 13 10*3/uL — ABNORMAL HIGH (ref 4.0–10.5)
nRBC: 0 % (ref 0.0–0.2)

## 2021-01-30 LAB — GLUCOSE, CAPILLARY
Glucose-Capillary: 120 mg/dL — ABNORMAL HIGH (ref 70–99)
Glucose-Capillary: 121 mg/dL — ABNORMAL HIGH (ref 70–99)
Glucose-Capillary: 124 mg/dL — ABNORMAL HIGH (ref 70–99)
Glucose-Capillary: 179 mg/dL — ABNORMAL HIGH (ref 70–99)
Glucose-Capillary: 99 mg/dL (ref 70–99)

## 2021-01-30 MED ORDER — METFORMIN HCL 500 MG PO TABS
500.0000 mg | ORAL_TABLET | Freq: Every day | ORAL | Status: DC
  Administered 2021-01-30: 500 mg via ORAL
  Filled 2021-01-30: qty 1

## 2021-01-30 MED ORDER — PROPOFOL 10 MG/ML IV BOLUS
INTRAVENOUS | Status: AC
  Filled 2021-01-30: qty 20

## 2021-01-30 MED ORDER — SODIUM CHLORIDE 0.9 % IV SOLN: INTRAVENOUS | Status: DC

## 2021-01-30 MED ORDER — ACETAMINOPHEN 325 MG PO TABS: 650.0000 mg | ORAL_TABLET | Freq: Four times a day (QID) | ORAL | Status: DC | PRN

## 2021-01-30 MED ORDER — INSULIN ASPART 100 UNIT/ML ~~LOC~~ SOLN
0.0000 [IU] | Freq: Every day | SUBCUTANEOUS | Status: DC
  Administered 2021-01-31: 2 [IU] via SUBCUTANEOUS

## 2021-01-30 MED ORDER — HYDROMORPHONE HCL 1 MG/ML IJ SOLN
1.0000 mg | INTRAMUSCULAR | Status: AC | PRN
Start: 2021-01-30 — End: 2021-01-31
  Administered 2021-01-30 – 2021-01-31 (×3): 1 mg via INTRAVENOUS
  Filled 2021-01-30 (×3): qty 1

## 2021-01-30 MED ORDER — ACETAMINOPHEN 650 MG RE SUPP: 650.0000 mg | Freq: Four times a day (QID) | RECTAL | Status: DC | PRN

## 2021-01-30 MED ORDER — CHLORHEXIDINE GLUCONATE CLOTH 2 % EX PADS
6.0000 | MEDICATED_PAD | Freq: Every day | CUTANEOUS | Status: DC
  Administered 2021-01-30 – 2021-02-02 (×4): 6 via TOPICAL

## 2021-01-30 MED ORDER — MEMANTINE HCL ER 28 MG PO CP24
28.0000 mg | ORAL_CAPSULE | Freq: Every day | ORAL | Status: DC
  Administered 2021-01-30 – 2021-02-02 (×4): 28 mg via ORAL
  Filled 2021-01-30 (×4): qty 1

## 2021-01-30 MED ORDER — SODIUM CHLORIDE 0.9 % IV SOLN
1.0000 g | INTRAVENOUS | Status: DC
  Administered 2021-01-30 – 2021-02-02 (×3): 1 g via INTRAVENOUS
  Filled 2021-01-30 (×3): qty 1
  Filled 2021-01-30 (×2): qty 10

## 2021-01-30 MED ORDER — MEMANTINE HCL-DONEPEZIL HCL ER 28-10 MG PO CP24: 1.0000 | ORAL_CAPSULE | Freq: Every day | ORAL | Status: DC

## 2021-01-30 MED ORDER — LINAGLIPTIN 5 MG PO TABS
5.0000 mg | ORAL_TABLET | Freq: Every day | ORAL | Status: DC
  Administered 2021-01-30 – 2021-02-02 (×4): 5 mg via ORAL
  Filled 2021-01-30 (×4): qty 1

## 2021-01-30 MED ORDER — DONEPEZIL HCL 10 MG PO TABS
10.0000 mg | ORAL_TABLET | Freq: Every day | ORAL | Status: DC
  Administered 2021-01-30 – 2021-02-02 (×4): 10 mg via ORAL
  Filled 2021-01-30 (×4): qty 1

## 2021-01-30 MED ORDER — LOSARTAN POTASSIUM 50 MG PO TABS
50.0000 mg | ORAL_TABLET | Freq: Every day | ORAL | Status: DC
  Administered 2021-01-30 – 2021-02-02 (×4): 50 mg via ORAL
  Filled 2021-01-30 (×4): qty 1

## 2021-01-30 MED ORDER — ATORVASTATIN CALCIUM 40 MG PO TABS
40.0000 mg | ORAL_TABLET | Freq: Every day | ORAL | Status: DC
  Administered 2021-01-30 – 2021-02-01 (×3): 40 mg via ORAL
  Filled 2021-01-30 (×3): qty 1

## 2021-01-30 MED ORDER — FENTANYL CITRATE (PF) 250 MCG/5ML IJ SOLN
INTRAMUSCULAR | Status: AC
  Filled 2021-01-30: qty 5

## 2021-01-30 MED ORDER — METOPROLOL SUCCINATE ER 25 MG PO TB24
50.0000 mg | ORAL_TABLET | Freq: Every day | ORAL | Status: DC
  Administered 2021-01-30 – 2021-02-02 (×4): 50 mg via ORAL
  Filled 2021-01-30 (×4): qty 2

## 2021-01-30 MED ORDER — INSULIN ASPART 100 UNIT/ML ~~LOC~~ SOLN
0.0000 [IU] | Freq: Three times a day (TID) | SUBCUTANEOUS | Status: DC
  Administered 2021-01-30 – 2021-02-01 (×4): 1 [IU] via SUBCUTANEOUS
  Administered 2021-02-01 – 2021-02-02 (×2): 2 [IU] via SUBCUTANEOUS

## 2021-01-30 MED ORDER — ALBUTEROL SULFATE HFA 108 (90 BASE) MCG/ACT IN AERS
2.0000 | INHALATION_SPRAY | Freq: Four times a day (QID) | RESPIRATORY_TRACT | Status: DC | PRN
  Filled 2021-01-30: qty 6.7

## 2021-01-30 NOTE — Progress Notes (Signed)
PROGRESS NOTE  Gary Mcneil ACZ:[id number] DOB: October 21, 1941   PCP: Har[place], MD  Patient is from: SNF  DOA: 01/29/2021 LOS: 1  Chief complaints: Blood in urine and pelvic pain  Brief Narrative / Interim history: 80 year old male with PMH of dementia, systolic CHF, DM-2, urinary retention, recent hospitalization from 2/4-2/10 for respiratory failure due to COVID-19 PNA and PE, and 2/22-3/1 for respiratory failure due to CHF exacerbation and possible pneumonia returning from SNF with the above chief complaints, and admitted for hematuria.  Bedside US showed about a liter of urine and large volume blood clot in bladder.  CT renal confirmed large blood clots in bladder and enlarged prostate.  Evaluated by urology and started on CBI.  Urine culture obtained.  Started on ceftriaxone.  Urology planning cystoscopy with possible TURBT on 3/14.  Subjective: Seen and examined earlier this morning.  No major events overnight or this morning.  He complains low abdominal pain in his pelvis.  Denies chest pain, dyspnea, nausea or vomiting.  He refused abdominal palpation for exam.   Objective: Vitals:   01/30/21 0001 01/30/21 0556 01/30/21 0600 01/30/21 1005  BP: 118/66 102/82  113/61  Pulse: 93 (!) 42  80  Resp: 17 17  16   Temp: 98 F (36.7 C) 98.2 F (36.8 C)  97.6 F (36.4 C)  TempSrc: Oral   Axillary  SpO2: 91% (!) 87% 94% 100%    Intake/Output Summary (Last 24 hours) at 01/30/2021 1310 Last data filed at 01/30/2021 0700 Gross per 24 hour  Intake 3440 ml  Output 02/01/2021 ml  Net -8560 ml   There were no vitals filed for this visit.  Examination:  GENERAL: No apparent distress.  Nontoxic. HEENT: MMM.  Vision and hearing grossly intact.  NECK: Supple.  No apparent JVD.  RESP:  No IWOB.  Fair aeration bilaterally. CVS:  RRR. Heart sounds normal.  ABD/GI/GU: BS+.  Patient did not allow palpation.  On CBI.  Clear pink urine MSK/EXT:  Moves extremities. No apparent deformity. No  edema.  SKIN: no apparent skin lesion or wound NEURO: Awake, alert and oriented to self, place and situation.  No apparent focal neuro deficit. PSYCH: Calm. Normal affect.   Procedures:  Continuous bladder irrigation  Microbiology summarized: Urine culture pending.  Assessment & Plan: Hematuria/chronic urinary retention/enlarged prostate-bedside 10932 showed 930 cc urine and large blood clot.  CT confirmed large blood clots and also showed enlarged prostate. -On continuous bladder irrigation per urology -Plan for cystoscopy and TURBT on 3/14 -Started IV ceftriaxone to cover for possible UTI pending urine culture -Continue holding Eliquis  Chronic systolic CHF: TTE on 2/23 with LVEF of 20 to 25%, GH, RVSP of 40.7.  No cardiopulmonary symptoms.  Appears euvolemic.  On Lasix 40 mg daily at home. -Discontinue IV fluid  -Hold home Lasix -Continue home losartan and metoprolol -Monitor fluid status and renal function.  Recent pulmonary embolism: Diagnosed 2/4.  PE was segmental and subsegmental in bilateral lower lobes.  No central PE at that time. -Continue holding Eliquis in the setting of hematuria  NIDDM-2: On Tradjenta and Metformin at home. Recent Labs  Lab 01/30/21 0021 01/30/21 0727 01/30/21 1137  GLUCAP 179* 124* 120*  -Check hemoglobin A1c -Continue SSI-sensitive scale -Continue home Tradjenta  Essential hypertension: Normotensive -Continue home metoprolol and losartan  Dementia without behavioral disturbance -Continue home memantine -Reorientation and delirium precautions  Leukocytosis: Improved. -Continue monitoring  History of COVID-19 infection -Outside the window for airborne precautions.  Goal  of care: Patient with significant cardiac and pulmonary comorbidities as above.  Also dementia.  Prognosis probably less than 6 months.  He is still full code. I have discussed CODE STATUS and goals of care with patient's son, Karma who says he is HCPOA.  I have  discussed the pros and cons of CPR and intubation and recommended DNR and DNI. Oluwatimilehin appreciated the info but would like to discuss with his sister and let us know on 3/14.  I offered him palliative care consult that he appreciated. -Palliative care consulted.   There is no height or weight on file to calculate BMI.         DVT prophylaxis:  Place and maintain sequential compression device Start: 01/30/21 1311  Code Status: Full code Family Communication: Updated patient's son over the phone. Level of care: Med-Surg Status is: Inpatient  Remains inpatient appropriate because:Ongoing diagnostic testing needed not appropriate for outpatient work up, Unsafe d/c plan, IV treatments appropriate due to intensity of illness or inability to take PO and Inpatient level of care appropriate due to severity of illness   Dispo: The patient is from: SNF              Anticipated d/c is to: SNF              Patient currently is not medically stable to d/c.   Difficult to place patient No       Consultants:  Urology   Sch Meds:  Scheduled Meds: . atorvastatin  40 mg Oral QHS  . donepezil  10 mg Oral Daily  . insulin aspart  0-5 Units Subcutaneous QHS  . insulin aspart  0-9 Units Subcutaneous TID WC  . linagliptin  5 mg Oral Daily  . losartan  50 mg Oral Daily  . memantine  28 mg Oral Daily  . metoprolol succinate  50 mg Oral Daily   Continuous Infusions: . cefTRIAXone (ROCEPHIN)  IV 1 g (01/30/21 0957)   PRN Meds:.acetaminophen **OR** acetaminophen, albuterol, HYDROmorphone (DILAUDID) injection  Antimicrobials: Anti-infectives (From admission, onward)   Start     Dose/Rate Route Frequency Ordered Stop   01/30/21 1000  cefTRIAXone (ROCEPHIN) 1 g in sodium chloride 0.9 % 100 mL IVPB        1 g 200 mL/hr over 30 Minutes Intravenous Every 24 hours 01/30/21 0745 02/04/21 0959       I have personally reviewed the following labs and images: CBC: Recent Labs  Lab  01/29/21 1850 01/30/21 0329  WBC 19.7* 13.0*  NEUTROABS 14.5*  --   HGB 12.5* 11.3*  HCT 38.1* 32.0*  MCV 88.4 83.6  PLT 58* 58*   BMP &GFR Recent Labs  Lab 01/29/21 1850 01/30/21 0329  NA 134* 139  K 4.8 4.7  CL 103 106  CO2 21* 26  GLUCOSE 189* 177*  BUN 33* 34*  CREATININE 1.25* 1.09  CALCIUM 8.4* 8.8*   Estimated Creatinine Clearance: 59.4 mL/min (by C-G formula based on SCr of 1.09 mg/dL). Liver & Pancreas: No results for input(s): AST, ALT, ALKPHOS, BILITOT, PROT, ALBUMIN in the last 168 hours. No results for input(s): LIPASE, AMYLASE in the last 168 hours. No results for input(s): AMMONIA in the last 168 hours. Diabetic: No results for input(s): HGBA1C in the last 72 hours. Recent Labs  Lab 01/30/21 0021 01/30/21 0727 01/30/21 1137  GLUCAP 179* 124* 120*   Cardiac Enzymes: No results for input(s): CKTOTAL, CKMB, CKMBINDEX, TROPONINI in the last 168 hours. No results  for input(s): PROBNP in the last 8760 hours. Coagulation Profile: Recent Labs  Lab 01/29/21 2322  INR 1.3*   Thyroid Function Tests: No results for input(s): TSH, T4TOTAL, FREET4, T3FREE, THYROIDAB in the last 72 hours. Lipid Profile: No results for input(s): CHOL, HDL, LDLCALC, TRIG, CHOLHDL, LDLDIRECT in the last 72 hours. Anemia Panel: No results for input(s): VITAMINB12, FOLATE, FERRITIN, TIBC, IRON, RETICCTPCT in the last 72 hours. Urine analysis:    Component Value Date/Time   COLORURINE RED (A) 01/29/2021 2103   APPEARANCEUR TURBID (A) 01/29/2021 2103   APPEARANCEUR Clear 05/05/2016 1554   LABSPEC 1.010 01/29/2021 2103   PHURINE 5.5 01/29/2021 2103   GLUCOSEU NEGATIVE 01/29/2021 2103   HGBUR LARGE (A) 01/29/2021 2103   BILIRUBINUR NEGATIVE 01/29/2021 2103   BILIRUBINUR Negative 05/05/2016 1554   KETONESUR NEGATIVE 01/29/2021 2103   PROTEINUR 30 (A) 01/29/2021 2103   UROBILINOGEN 1.0 04/29/2015 1030   NITRITE NEGATIVE 01/29/2021 2103   LEUKOCYTESUR SMALL (A) 01/29/2021 2103    Sepsis Labs: Invalid input(s): PROCALCITONIN, LACTICIDVEN  Microbiology: No results found for this or any previous visit (from the past 240 hour(s)).  Radiology Studies: US PELVIS (TRANSABDOMINAL ONLY)  Result Date: 01/29/2021 CLINICAL DATA:  Gross hematuria. EXAM: PELVIC ULTRASOUND COMPLETE COMPARISON:  None. FINDINGS: Bladder: A Foley catheter is seen within the urinary bladder which measures 3.6 cm x 2.5 cm x 8.3 cm (61.8 cm3). A mild-to-moderate amount of layering heterogeneous echogenic debris is seen within the dependent portion of the bladder lumen. This area measures approximately 8.3 cm x 5.6 cm x 2.5 cm. IMPRESSION: Echogenic debris within the urinary bladder, likely consistent with residual blood products. Electronically Signed   By: Aram Candela M.D.   On: 01/29/2021 22:04   US Renal  Result Date: 01/29/2021 CLINICAL DATA:  Urinary retention, evaluate for bladder clot EXAM: RENAL / URINARY TRACT ULTRASOUND COMPLETE COMPARISON:  None. FINDINGS: Right Kidney: Renal measurements: 10.5 x 5.8 x 5.4 cm = volume: 170 mL. Echogenicity within normal limits. Simple cysts. No mass or hydronephrosis visualized. Left Kidney: Renal measurements: 11.1 x 3 2 x 5.4 cm = volume: 193 mL. Echogenicity within normal limits. No mass or hydronephrosis visualized. Bladder: Large volume of clot and debris within the urinary bladder. Prevoid volume 931 ml. Other: None. IMPRESSION: 1. Large volume of clot and debris within the urinary bladder. Bladder volume 931 mL. 2.  No hydronephrosis. Electronically Signed   By: Lauralyn Primes M.D.   On: 01/29/2021 19:50   DG CHEST PORT 1 VIEW  Result Date: 01/29/2021 CLINICAL DATA:  Leukocytosis EXAM: PORTABLE CHEST 1 VIEW COMPARISON:  01/12/2021 FINDINGS: Single frontal view of the chest demonstrates a stable cardiac silhouette. Chronic ectasia of the thoracic aorta. Fibrotic changes are seen within the lung bases. Marked improvement in the bilateral areas of airspace  disease seen on prior study. No effusion or pneumothorax. No acute bony abnormalities. IMPRESSION: 1. Basilar predominant fibrotic changes. 2. Resolution of the bilateral airspace disease seen previously. Electronically Signed   By: Sharlet Salina M.D.   On: 01/29/2021 22:33   CT RENAL STONE STUDY  Result Date: 01/29/2021 CLINICAL DATA:  Hematuria, blood clots in Foley catheter, right inguinal pain EXAM: CT ABDOMEN AND PELVIS WITHOUT CONTRAST TECHNIQUE: Multidetector CT imaging of the abdomen and pelvis was performed following the standard protocol without IV contrast. COMPARISON:  01/29/2021 FINDINGS: Lower chest: Diffuse fibrotic changes are seen throughout the lung bases. No acute pleural or parenchymal lung disease. Unenhanced CT was performed per clinician  order. Lack of IV contrast limits sensitivity and specificity, especially for evaluation of abdominal/pelvic solid viscera. Hepatobiliary: Calcified gallstones layer dependently within the gallbladder. No evidence of acute cholecystitis. Unenhanced imaging of the liver is unremarkable. Pancreas: Unremarkable. No pancreatic ductal dilatation or surrounding inflammatory changes. Spleen: Normal in size without focal abnormality. Adrenals/Urinary Tract: No evidence of urinary tract calculi or obstructive uropathy. There are bilateral simple renal cysts, with a 1.5 cm hyperdense cyst lateral aspect right kidney. These were visualized on preceding renal ultrasound. The adrenals are unremarkable. Foley catheter is seen within the urinary bladder. High attenuation material within the bladder lumen consistent with blood products seen on recent ultrasound. Stomach/Bowel: No bowel obstruction or ileus. Normal appendix right lower quadrant. Scattered diverticulosis of the distal colon without diverticulitis. No bowel wall thickening or inflammatory change. Vascular/Lymphatic: Aortic atherosclerosis. No enlarged abdominal or pelvic lymph nodes. Reproductive: Prostate  is enlarged measuring 6.4 x 6.0 x 5.5 cm. Limited visualization of the scrotum demonstrates a right-sided hydrocele. Other: There is no free fluid or free gas. No abdominal wall hernia. Musculoskeletal: No acute or destructive bony lesions. Extensive spondylosis and facet hypertrophy throughout the lumbar spine. Reconstructed images demonstrate no additional findings. IMPRESSION: 1. High attenuation material within the bladder lumen consistent with blood products identified on recent ultrasound. Foley catheter appears appropriately positioned. 2. Enlarged prostate. 3. Cholelithiasis without cholecystitis. 4. Right-sided hydrocele, partially imaged. 5. Bilateral simple and hyperdense renal cysts, evaluated on preceding ultrasound. 6. Aortic Atherosclerosis (ICD10-I70.0) and Emphysema (ICD10-J43.9). Electronically Signed   By: Sharlet SalinaMichael  Brown M.D.   On: 01/29/2021 22:51      Taye T[facility]  If 7PM-7AM, please contact night-coverage [email or web address] 01/30/2021, 1:10 PM

## 2021-01-30 NOTE — Progress Notes (Addendum)
Subjective: NAEON. AFHDS. Repeat pelvic ultrasound after hematuria catheter placement and bedside irrigation with persistent large bladder clot. Subsequent CT renal stone protocol with enlarged prostate and large bladder clot as well as bilateral renal cysts and right-sided hydrocele; no nephrolithiasis. Hgb stable. Cr normalized.   Urine currently clear on moderate drip CBI this morning. Irrigated catheter at bedside without return of clot. C/f persistent, well-formed clot that we are unable to remove just by bedside irrigation.   Objective: Vital signs in last 24 hours: Temp:  [97.6 F (36.4 C)-98.2 F (36.8 C)] 97.6 F (36.4 C) (03/13 1005) Pulse Rate:  [42-103] 80 (03/13 1005) Resp:  [16-35] 16 (03/13 1005) BP: (90-120)/(61-82) 113/61 (03/13 1005) SpO2:  [87 %-100 %] 100 % (03/13 1005)  Intake/Output from previous day: 03/12 0701 - 03/13 0700 In: 3440 [P.O.:240; I.V.:200] Out: 46568 [Urine:12000] Intake/Output this shift: No intake/output data recorded.  Physical Exam:  General: Alert and oriented CV: RRR Lungs: Clear Abdomen: Soft, ND Incisions: Ext: NT, No erythema  Lab Results: Recent Labs    01/29/21 1850 01/30/21 0329  HGB 12.5* 11.3*  HCT 38.1* 32.0*   BMET Recent Labs    01/29/21 1850 01/30/21 0329  NA 134* 139  K 4.8 4.7  CL 103 106  CO2 21* 26  GLUCOSE 189* 177*  BUN 33* 34*  CREATININE 1.25* 1.09  CALCIUM 8.4* 8.8*     Studies/Results: US PELVIS (TRANSABDOMINAL ONLY)  Result Date: 01/29/2021 CLINICAL DATA:  Gross hematuria. EXAM: PELVIC ULTRASOUND COMPLETE COMPARISON:  None. FINDINGS: Bladder: A Foley catheter is seen within the urinary bladder which measures 3.6 cm x 2.5 cm x 8.3 cm (61.8 cm3). A mild-to-moderate amount of layering heterogeneous echogenic debris is seen within the dependent portion of the bladder lumen. This area measures approximately 8.3 cm x 5.6 cm x 2.5 cm. IMPRESSION: Echogenic debris within the urinary bladder, likely  consistent with residual blood products. Electronically Signed   By: Aram Candela M.D.   On: 01/29/2021 22:04   US Renal  Result Date: 01/29/2021 CLINICAL DATA:  Urinary retention, evaluate for bladder clot EXAM: RENAL / URINARY TRACT ULTRASOUND COMPLETE COMPARISON:  None. FINDINGS: Right Kidney: Renal measurements: 10.5 x 5.8 x 5.4 cm = volume: 170 mL. Echogenicity within normal limits. Simple cysts. No mass or hydronephrosis visualized. Left Kidney: Renal measurements: 11.1 x 3 2 x 5.4 cm = volume: 193 mL. Echogenicity within normal limits. No mass or hydronephrosis visualized. Bladder: Large volume of clot and debris within the urinary bladder. Prevoid volume 931 ml. Other: None. IMPRESSION: 1. Large volume of clot and debris within the urinary bladder. Bladder volume 931 mL. 2.  No hydronephrosis. Electronically Signed   By: Lauralyn Primes M.D.   On: 01/29/2021 19:50   DG CHEST PORT 1 VIEW  Result Date: 01/29/2021 CLINICAL DATA:  Leukocytosis EXAM: PORTABLE CHEST 1 VIEW COMPARISON:  01/12/2021 FINDINGS: Single frontal view of the chest demonstrates a stable cardiac silhouette. Chronic ectasia of the thoracic aorta. Fibrotic changes are seen within the lung bases. Marked improvement in the bilateral areas of airspace disease seen on prior study. No effusion or pneumothorax. No acute bony abnormalities. IMPRESSION: 1. Basilar predominant fibrotic changes. 2. Resolution of the bilateral airspace disease seen previously. Electronically Signed   By: Sharlet Salina M.D.   On: 01/29/2021 22:33   CT RENAL STONE STUDY  Result Date: 01/29/2021 CLINICAL DATA:  Hematuria, blood clots in Foley catheter, right inguinal pain EXAM: CT ABDOMEN AND PELVIS WITHOUT CONTRAST TECHNIQUE:  Multidetector CT imaging of the abdomen and pelvis was performed following the standard protocol without IV contrast. COMPARISON:  01/29/2021 FINDINGS: Lower chest: Diffuse fibrotic changes are seen throughout the lung bases. No acute  pleural or parenchymal lung disease. Unenhanced CT was performed per clinician order. Lack of IV contrast limits sensitivity and specificity, especially for evaluation of abdominal/pelvic solid viscera. Hepatobiliary: Calcified gallstones layer dependently within the gallbladder. No evidence of acute cholecystitis. Unenhanced imaging of the liver is unremarkable. Pancreas: Unremarkable. No pancreatic ductal dilatation or surrounding inflammatory changes. Spleen: Normal in size without focal abnormality. Adrenals/Urinary Tract: No evidence of urinary tract calculi or obstructive uropathy. There are bilateral simple renal cysts, with a 1.5 cm hyperdense cyst lateral aspect right kidney. These were visualized on preceding renal ultrasound. The adrenals are unremarkable. Foley catheter is seen within the urinary bladder. High attenuation material within the bladder lumen consistent with blood products seen on recent ultrasound. Stomach/Bowel: No bowel obstruction or ileus. Normal appendix right lower quadrant. Scattered diverticulosis of the distal colon without diverticulitis. No bowel wall thickening or inflammatory change. Vascular/Lymphatic: Aortic atherosclerosis. No enlarged abdominal or pelvic lymph nodes. Reproductive: Prostate is enlarged measuring 6.4 x 6.0 x 5.5 cm. Limited visualization of the scrotum demonstrates a right-sided hydrocele. Other: There is no free fluid or free gas. No abdominal wall hernia. Musculoskeletal: No acute or destructive bony lesions. Extensive spondylosis and facet hypertrophy throughout the lumbar spine. Reconstructed images demonstrate no additional findings. IMPRESSION: 1. High attenuation material within the bladder lumen consistent with blood products identified on recent ultrasound. Foley catheter appears appropriately positioned. 2. Enlarged prostate. 3. Cholelithiasis without cholecystitis. 4. Right-sided hydrocele, partially imaged. 5. Bilateral simple and hyperdense renal  cysts, evaluated on preceding ultrasound. 6. Aortic Atherosclerosis (ICD10-I70.0) and Emphysema (ICD10-J43.9). Electronically Signed   By: Sharlet Salina M.D.   On: 01/29/2021 22:51    Assessment/Plan: Gary Mcneil is a 80 y.o. 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 He was incidentally found to have urinary retention and AKI during hospitalization c/b foley trauma and hematuria requiring urology to scope in a hematuria catheter on 2/27 with brief period on CBI. He was discharged with the foley, which was removed in urology clinic on 3/4. It is unclear if a foley was replaced at his SNF, but he represented to the ED on 3/12 in clot retention with palpable bladder on exam (931cc in bladder on ultrasound) and large bladder clot on imaging. Urology placed a 22 Fr 3-way hematuria catheter, manually irrigated bladder, and started CBI.  Patient has a persistent large bladder clot on imaging that we are unable to remove via bedside irrigation. He has remained AFHDS with stable Hgb, so there is lower concern for active bleeding, rather he likely has a well-formed clot that may have been there for some time. Given his large clot, we recommend proceeding with cystourethroscopy and clot evacuation +/- fulguration. We will also perform bilateral retrograde pyelograms to complete hematuria workup.   Unfortunately, patient wasn't made NPO at midnight and had oral intake this morning, so we are unable to perform his case today.   - Continue CBI. Titrate CBI to achieve pink urine. If foley catheter stops draining, please stop CBI, disconnect foley from tubing, and manually irrigate the foley with a toomey syringe to  relieve any possible obstructing clots. If unable to flush  foley catheter, please page urology - Follow up urine culture. Recommend empiric antibiotics for possible UTI given leukocytosis pending urine culture results - Please make patient NPO at midnight for above procedure tomorrow - Continue foley catheter to drainage. Patient will need foley catheter for at least 10-14 days for bladder rest given bladder stretch injury. We will arrange appropriate outpatient follow up pending his clinical course - Recommend platelet transfusion for thrombocytopenia </= 50 - We will continue to follow along     LOS: 1 day   Margette Fast 01/30/2021, 10:28 AM  I have seen and examined the patient and agree with the above assessment and plan.  Attempted to add on today; however, patient ate breakfast. Will add on tomorrow for cystoscopy, b/l RPG, clot evacuation, possible bladder biopsy, possible TURBT. NPO after midnight. -Urine is clear on CBI. He just has large well formed clot burden in his bladder that should be removed at some point.  Matt R. Cyrena Kuchenbecker MD [facility]  Pager: [phone]

## 2021-01-31 ENCOUNTER — Encounter (HOSPITAL_COMMUNITY)
Admission: EM | Disposition: A | Discharge status: Skilled Nursing Facility | Payer: Self-pay | Source: Skilled Nursing Facility | Attending: Internal Medicine

## 2021-01-31 DIAGNOSIS — R319 Hematuria, unspecified: Secondary | ICD-10-CM | POA: Diagnosis not present

## 2021-01-31 DIAGNOSIS — F0390 Unspecified dementia without behavioral disturbance: Secondary | ICD-10-CM | POA: Diagnosis not present

## 2021-01-31 DIAGNOSIS — Z515 Encounter for palliative care: Secondary | ICD-10-CM

## 2021-01-31 DIAGNOSIS — I5020 Unspecified systolic (congestive) heart failure: Secondary | ICD-10-CM | POA: Diagnosis not present

## 2021-01-31 DIAGNOSIS — Z7189 Other specified counseling: Secondary | ICD-10-CM | POA: Diagnosis not present

## 2021-01-31 DIAGNOSIS — I10 Essential (primary) hypertension: Secondary | ICD-10-CM | POA: Diagnosis not present

## 2021-01-31 LAB — CBC WITH DIFFERENTIAL/PLATELET
Abs Immature Granulocytes: 0.2 10*3/uL — ABNORMAL HIGH (ref 0.00–0.07)
Basophils Absolute: 0.1 10*3/uL (ref 0.0–0.1)
Basophils Relative: 1 %
Eosinophils Absolute: 0.3 10*3/uL (ref 0.0–0.5)
Eosinophils Relative: 3 %
HCT: 27.7 % — ABNORMAL LOW (ref 39.0–52.0)
Hemoglobin: 10.1 g/dL — ABNORMAL LOW (ref 13.0–17.0)
Immature Granulocytes: 2 %
Lymphocytes Relative: 20 %
Lymphs Abs: 2 10*3/uL (ref 0.7–4.0)
MCH: 29.9 pg (ref 26.0–34.0)
MCHC: 36.5 g/dL — ABNORMAL HIGH (ref 30.0–36.0)
MCV: 82 fL (ref 80.0–100.0)
Monocytes Absolute: 0.8 10*3/uL (ref 0.1–1.0)
Monocytes Relative: 8 %
Neutro Abs: 6.8 10*3/uL (ref 1.7–7.7)
Neutrophils Relative %: 66 %
Platelets: 65 10*3/uL — ABNORMAL LOW (ref 150–400)
RBC: 3.38 MIL/uL — ABNORMAL LOW (ref 4.22–5.81)
RDW: 15.6 % — ABNORMAL HIGH (ref 11.5–15.5)
WBC: 10.2 10*3/uL (ref 4.0–10.5)
nRBC: 0 % (ref 0.0–0.2)

## 2021-01-31 LAB — RENAL FUNCTION PANEL
Albumin: 2.6 g/dL — ABNORMAL LOW (ref 3.5–5.0)
Anion gap: 8 (ref 5–15)
BUN: 23 mg/dL (ref 8–23)
CO2: 24 mmol/L (ref 22–32)
Calcium: 8.7 mg/dL — ABNORMAL LOW (ref 8.9–10.3)
Chloride: 106 mmol/L (ref 98–111)
Creatinine, Ser: 0.87 mg/dL (ref 0.61–1.24)
GFR, Estimated: >60 mL/min (ref >60)
Glucose, Bld: 125 mg/dL — ABNORMAL HIGH (ref 70–99)
Phosphorus: 3.2 mg/dL (ref 2.5–4.6)
Potassium: 4.1 mmol/L (ref 3.5–5.1)
Sodium: 138 mmol/L (ref 135–145)

## 2021-01-31 LAB — HEMOGLOBIN A1C
Hgb A1c MFr Bld: 8 % — ABNORMAL HIGH (ref 4.8–5.6)
Mean Plasma Glucose: 182.9 mg/dL

## 2021-01-31 LAB — GLUCOSE, CAPILLARY
Glucose-Capillary: 134 mg/dL — ABNORMAL HIGH (ref 70–99)
Glucose-Capillary: 102 mg/dL — ABNORMAL HIGH (ref 70–99)
Glucose-Capillary: 142 mg/dL — ABNORMAL HIGH (ref 70–99)
Glucose-Capillary: 204 mg/dL — ABNORMAL HIGH (ref 70–99)

## 2021-01-31 LAB — MAGNESIUM: Magnesium: 1.6 mg/dL — ABNORMAL LOW (ref 1.7–2.4)

## 2021-01-31 SURGERY — CYSTOURETEROSCOPY, WITH RETROGRADE PYELOGRAM AND STENT INSERTION
Anesthesia: General | Laterality: Bilateral

## 2021-01-31 MED ORDER — SODIUM CHLORIDE 0.9 % IV SOLN: INTRAVENOUS | Status: DC

## 2021-01-31 NOTE — [place] FL2 (Signed)
[place] MEDICAID FL2 LEVEL OF CARE SCREENING TOOL     IDENTIFICATION  Patient Name: Gary Mcneil Birthdate: [birth year] Sex: male Admission Date (Current Location): 01/29/2021  [facility] and [place] Number:  Producer, television/film/video and Address:  [facility] [place], [address] [place], [place] [address]      Provider Number: [id number]  Attending Physician Name and Address:  Pennie Banter, DO  Relative Name and Phone Number:  Taishawn, Smaldone [facility])   [phone] (Mobile    Current Level of Care: Hospital Recommended Level of Care: Skilled Nursing Facility Prior Approval Number: [id number]  Date Approved/Denied:   PASRR Number:    Discharge Plan: SNF    Current Diagnoses: Patient Active Problem List   Diagnosis Date Noted  . Hematuria 01/29/2021  . Leukocytosis 01/29/2021  . Thrombocytopenia (HCC) 01/29/2021  . HFrEF (heart failure with reduced ejection fraction) (HCC) 01/29/2021  . Pelvic pain 01/29/2021  . Acute on chronic combined systolic and diastolic CHF (congestive heart failure) (HCC) 01/12/2021  . Alcohol use 01/12/2021  . 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 Acute hypoxemic respiratory failure due to COVID-19 (HCC) 12/24/2020  . Pulmonary embolism (HCC) 12/24/2020  . Tobacco use disorder   . Hypertension   . GERD (gastroesophageal reflux disease)   . Acute kidney failure, unspecified (HCC)   . 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 Empyema lung (HCC) 04/30/2015  . Loculated pleural effusion   . Hyperglycemia   . Right lower lobe lung mass   . Lung mass 04/24/2015  . Lethargy   . Essential hypertension   . HLD (hyperlipidemia)   . Right-sided chest pain 04/20/2015  . Transaminitis 04/20/2015  . Positive D dimer 04/20/2015  . Acute respiratory failure with hypoxia (HCC) 04/20/2015  . Pleural effusion, right 04/20/2015  . Hypokalemia 04/20/2015  .  Dementia without behavioral disturbance (HCC) 09/02/2014  . Type 2 diabetes mellitus with diabetic neuropathy (HCC) 05/20/2014  . Gout 04/29/2014  . Elevated liver enzymes 01/27/2014  . Anemia 05/02/2012  . Constipation 04/24/2012  . Dementia (HCC) 04/24/2012  . Arthritis   . HTN (hypertension) 04/13/2012  . Hyperlipidemia 04/13/2012    Orientation RESPIRATION BLADDER Height & Weight     Self,Time,Place  O2 (3L) Continent Weight:   Height:     BEHAVIORAL SYMPTOMS/MOOD NEUROLOGICAL BOWEL NUTRITION STATUS           AMBULATORY STATUS COMMUNICATION OF NEEDS Skin   Extensive Assist Verbally Normal                       Personal Care Assistance Level of Assistance  Bathing,Feeding,Dressing Bathing Assistance: Maximum assistance Feeding assistance: Limited assistance Dressing Assistance: Maximum assistance     Functional Limitations Info  Sight,Hearing,Speech Sight Info: Impaired Hearing Info: Adequate Speech Info: Adequate    SPECIAL CARE FACTORS FREQUENCY  PT (By licensed PT),OT (By licensed OT)     PT Frequency: 5x a week OT Frequency: 5x a week            Contractures Contractures Info: Not present    Additional Factors Info  Code Status,Allergies Code Status Info: Full Allergies Info: Penicillins           Current Medications (01/31/2021):  This is the current hospital active medication list Current Facility-Administered Medications  Medication Dose Route Frequency Provider Last Rate Last Admin  . acetaminophen (TYLENOL) tablet 650 mg  650 mg  Oral Q6H PRN Chotiner, Claudean Severance, MD       Or  . acetaminophen (TYLENOL) suppository 650 mg  650 mg Rectal Q6H PRN Chotiner, Claudean Severance, MD      . albuterol (VENTOLIN HFA) 108 (90 Base) MCG/ACT inhaler 2 puff  2 puff Inhalation Q6H PRN Chotiner, Claudean Severance, MD      . atorvastatin (LIPITOR) tablet 40 mg  40 mg Oral QHS Chotiner, Claudean Severance, MD   40 mg at 01/30/21 2056  . cefTRIAXone (ROCEPHIN) 1 g in sodium  chloride 0.9 % 100 mL IVPB  1 g Intravenous Q24H Candelaria Stagers T, MD 200 mL/hr at 01/31/21 0941 1 g at 01/31/21 0941  . Chlorhexidine Gluconate Cloth 2 % PADS 6 each  6 each Topical Daily Almon Hercules, MD   6 each at 01/30/21 2057  . donepezil (ARICEPT) tablet 10 mg  10 mg Oral Daily Chotiner, Claudean Severance, MD   10 mg at 01/31/21 0943  . HYDROmorphone (DILAUDID) injection 1 mg  1 mg Intravenous Q3H PRN Chotiner, Claudean Severance, MD   1 mg at 01/30/21 1001  . insulin aspart (novoLOG) injection 0-5 Units  0-5 Units Subcutaneous QHS Chotiner, Claudean Severance, MD      . insulin aspart (novoLOG) injection 0-9 Units  0-9 Units Subcutaneous TID WC Chotiner, Claudean Severance, MD   1 Units at 01/31/21 0848  . linagliptin (TRADJENTA) tablet 5 mg  5 mg Oral Daily Chotiner, Claudean Severance, MD   5 mg at 01/30/21 1001  . losartan (COZAAR) tablet 50 mg  50 mg Oral Daily Chotiner, Claudean Severance, MD   50 mg at 01/31/21 0942  . memantine (NAMENDA XR) 24 hr capsule 28 mg  28 mg Oral Daily Chotiner, Claudean Severance, MD   28 mg at 01/31/21 0944  . metoprolol succinate (TOPROL-XL) 24 hr tablet 50 mg  50 mg Oral Daily Chotiner, Claudean Severance, MD   50 mg at 01/31/21 5027     Discharge Medications: Please see discharge summary for a list of discharge medications.  Relevant Imaging Results:  Relevant Lab Results:   Additional Information SSN [id number]  Jimmy Picket, [place]

## 2021-01-31 NOTE — H&P (View-Only) (Signed)
Day of Surgery Subjective: NAEON. AFHDS. Hgb slightly downtrended to 10.1 today from 11.3 yesterday. Cr normalized.   Urine currently clear on slow drip CBI this morning. Irrigated catheter at bedside without return of clot. C/f persistent, well-formed clot that we are unable to remove just by bedside irrigation. NPO since MN for clot evacuation today under GA  Objective: Vital signs in last 24 hours: Temp:  [97.5 F (36.4 C)-98.5 F (36.9 C)] 97.7 F (36.5 C) (03/14 7564) Pulse Rate:  [80-90] 90 (03/14 0609) Resp:  [16-17] 17 (03/14 0608) BP: (104-128)/(61-112) 104/63 (03/14 0608) SpO2:  [74 %-100 %] 97 % (03/14 0609)  Intake/Output from previous day: 03/13 0701 - 03/14 0700 In: 3760 [P.O.:660; IV Piggyback:100] Out: 4001 [Urine:4000; Stool:1] Intake/Output this shift: No intake/output data recorded.  Physical Exam:  General: Alert and oriented CV: Regular rate Lungs: NWOB on RA GU: 22 Fr 3-way hematuria catheter in place draining clear urine on slow drip CBI Ext: Warm and well-perfused   Lab Results: Recent Labs    01/29/21 1850 01/30/21 0329 01/31/21 0210  HGB 12.5* 11.3* 10.1*  HCT 38.1* 32.0* 27.7*   BMET Recent Labs    01/30/21 0329 01/31/21 0210  NA 139 138  K 4.7 4.1  CL 106 106  CO2 26 24  GLUCOSE 177* 125*  BUN 34* 23  CREATININE 1.09 0.87  CALCIUM 8.8* 8.7*     Studies/Results: US PELVIS (TRANSABDOMINAL ONLY)  Result Date: 01/29/2021 CLINICAL DATA:  Gross hematuria. EXAM: PELVIC ULTRASOUND COMPLETE COMPARISON:  None. FINDINGS: Bladder: A Foley catheter is seen within the urinary bladder which measures 3.6 cm x 2.5 cm x 8.3 cm (61.8 cm3). A mild-to-moderate amount of layering heterogeneous echogenic debris is seen within the dependent portion of the bladder lumen. This area measures approximately 8.3 cm x 5.6 cm x 2.5 cm. IMPRESSION: Echogenic debris within the urinary bladder, likely consistent with residual blood products. Electronically Signed    By: Aram Candela M.D.   On: 01/29/2021 22:04   US Renal  Result Date: 01/29/2021 CLINICAL DATA:  Urinary retention, evaluate for bladder clot EXAM: RENAL / URINARY TRACT ULTRASOUND COMPLETE COMPARISON:  None. FINDINGS: Right Kidney: Renal measurements: 10.5 x 5.8 x 5.4 cm = volume: 170 mL. Echogenicity within normal limits. Simple cysts. No mass or hydronephrosis visualized. Left Kidney: Renal measurements: 11.1 x 3 2 x 5.4 cm = volume: 193 mL. Echogenicity within normal limits. No mass or hydronephrosis visualized. Bladder: Large volume of clot and debris within the urinary bladder. Prevoid volume 931 ml. Other: None. IMPRESSION: 1. Large volume of clot and debris within the urinary bladder. Bladder volume 931 mL. 2.  No hydronephrosis. Electronically Signed   By: Lauralyn Primes M.D.   On: 01/29/2021 19:50   DG CHEST PORT 1 VIEW  Result Date: 01/29/2021 CLINICAL DATA:  Leukocytosis EXAM: PORTABLE CHEST 1 VIEW COMPARISON:  01/12/2021 FINDINGS: Single frontal view of the chest demonstrates a stable cardiac silhouette. Chronic ectasia of the thoracic aorta. Fibrotic changes are seen within the lung bases. Marked improvement in the bilateral areas of airspace disease seen on prior study. No effusion or pneumothorax. No acute bony abnormalities. IMPRESSION: 1. Basilar predominant fibrotic changes. 2. Resolution of the bilateral airspace disease seen previously. Electronically Signed   By: Sharlet Salina M.D.   On: 01/29/2021 22:33   CT RENAL STONE STUDY  Result Date: 01/29/2021 CLINICAL DATA:  Hematuria, blood clots in Foley catheter, right inguinal pain EXAM: CT ABDOMEN AND PELVIS WITHOUT CONTRAST  TECHNIQUE: Multidetector CT imaging of the abdomen and pelvis was performed following the standard protocol without IV contrast. COMPARISON:  01/29/2021 FINDINGS: Lower chest: Diffuse fibrotic changes are seen throughout the lung bases. No acute pleural or parenchymal lung disease. Unenhanced CT was performed  per clinician order. Lack of IV contrast limits sensitivity and specificity, especially for evaluation of abdominal/pelvic solid viscera. Hepatobiliary: Calcified gallstones layer dependently within the gallbladder. No evidence of acute cholecystitis. Unenhanced imaging of the liver is unremarkable. Pancreas: Unremarkable. No pancreatic ductal dilatation or surrounding inflammatory changes. Spleen: Normal in size without focal abnormality. Adrenals/Urinary Tract: No evidence of urinary tract calculi or obstructive uropathy. There are bilateral simple renal cysts, with a 1.5 cm hyperdense cyst lateral aspect right kidney. These were visualized on preceding renal ultrasound. The adrenals are unremarkable. Foley catheter is seen within the urinary bladder. High attenuation material within the bladder lumen consistent with blood products seen on recent ultrasound. Stomach/Bowel: No bowel obstruction or ileus. Normal appendix right lower quadrant. Scattered diverticulosis of the distal colon without diverticulitis. No bowel wall thickening or inflammatory change. Vascular/Lymphatic: Aortic atherosclerosis. No enlarged abdominal or pelvic lymph nodes. Reproductive: Prostate is enlarged measuring 6.4 x 6.0 x 5.5 cm. Limited visualization of the scrotum demonstrates a right-sided hydrocele. Other: There is no free fluid or free gas. No abdominal wall hernia. Musculoskeletal: No acute or destructive bony lesions. Extensive spondylosis and facet hypertrophy throughout the lumbar spine. Reconstructed images demonstrate no additional findings. IMPRESSION: 1. High attenuation material within the bladder lumen consistent with blood products identified on recent ultrasound. Foley catheter appears appropriately positioned. 2. Enlarged prostate. 3. Cholelithiasis without cholecystitis. 4. Right-sided hydrocele, partially imaged. 5. Bilateral simple and hyperdense renal cysts, evaluated on preceding ultrasound. 6. Aortic  Atherosclerosis (ICD10-I70.0) and Emphysema (ICD10-J43.9). Electronically Signed   By: Sharlet Salina M.D.   On: 01/29/2021 22:51    Assessment/Plan: Gary Mcneil is a 80 y.o. 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 He was incidentally found to have urinary retention and AKI during hospitalization c/b foley trauma and hematuria requiring urology to scope in a hematuria catheter on 2/27 with brief period on CBI. He was discharged with the foley, which was removed in urology clinic on 3/4. It is unclear if a foley was replaced at his SNF, but he represented to the ED on 3/12 in clot retention with palpable bladder on exam (931cc in bladder on ultrasound) and large bladder clot on imaging. Urology placed a 22 Fr 3-way hematuria catheter, manually irrigated bladder, and started CBI.  Patient has a persistent large bladder clot on imaging that we are unable to remove via bedside irrigation. He has remained AFHDS with fairly stable Hgb, so there is lower concern for active bleeding, rather he likely has a well-formed clot that may have been there for some time. Given his large clot, we recommend proceeding with cystourethroscopy and clot evacuation +/- fulguration today. We will also perform bilateral retrograde pyelograms to complete hematuria workup.   - Please keep patient NPO at MN for above procedure today  - Continue CBI. Titrate CBI to achieve pink urine. If foley catheter stops draining, please stop CBI, disconnect foley from tubing, and manually irrigate the foley with a toomey syringe to relieve any possible obstructing clots. If unable to flush  foley catheter, please page urology - Continue foley catheter to drainage. Patient will need foley catheter  for at least 10-14 days for bladder rest given bladder stretch injury. We will arrange appropriate outpatient follow up pending his clinical course - Recommend platelet transfusion for thrombocytopenia </= 50 - We will continue to follow along     LOS: 2 days   Margette Fast 01/31/2021, 7:22 AM  I have seen and examined the patient and agree with the above assessment and plan.  Urine remains clear. Irrigates without difficulty. Planning for OR tomorrow for cysto, b/l RPG, clot evacuation, possible bladder biopsy, possible TURBT. NPO after midnight.  Matt R. Hollie Wojahn MD [facility]  Pager: [phone]

## 2021-01-31 NOTE — Progress Notes (Signed)
Pt case (cysto/clot evac/b/l RPG) moved to tomorrow. Ordered diet. NPO after midnight. Discussed with nursing.  Matt R. Colburn Asper MD [facility]  Pager: [phone]

## 2021-01-31 NOTE — Progress Notes (Addendum)
PROGRESS NOTE    Edwin DadaKenneth E [place]   ZHY:[id number]  DOB: 01/09/1941  PCP: Harvest ForestBakare, Mobolaji B, MD    DOA: 01/29/2021 LOS: 2   Brief Narrative   80 year old male with PMH of dementia, systolic CHF, DM-2, urinary retention, recent hospitalization from 2/4-2/10 for respiratory failure due to COVID-19 PNA and PE, and 2/22-3/1 for respiratory failure due to CHF exacerbation and possible pneumonia returning from SNF with the above chief complaints, and admitted for hematuria.  Bedside US showed about a liter of urine and large volume blood clot in bladder.  CT renal confirmed large blood clots in bladder and enlarged prostate.  Evaluated by urology and started on CBI.  Urine culture obtained.  Started on ceftriaxone.    Urology planning cystoscopy with possible TURBT on 3/14.  Assessment & Plan   Principal Problem:   Hematuria Active Problems:   Type 2 diabetes mellitus with diabetic neuropathy (HCC)   Dementia without behavioral disturbance (HCC)   Essential hypertension   Leukocytosis   Thrombocytopenia (HCC)   HFrEF (heart failure with reduced ejection fraction) (HCC)   Pelvic pain   Hematuria /chronic urinary retention /prostate enlargement -present admission.  Bedside ultrasound showed 9 3 0 cc retained urine a large blood clot.  CT confirmed large clots and also showed prostate management. --Urology following --On continuous bladder irrigation --N.p.o. for cystoscopy and TURBT today --Holding Eliquis --Continue empiric Rocephin pending urine culture  Chronic systolic CHF -echo in February showed EF 20 to 25%.  Patient appears euvolemic and overall compensated.  Takes Lasix 40 mg daily at home. --Off IV fluids --Holding home Lasix --Continue losartan, metoprolol --Monitor renal function, electrolytes and volume status closely  Recent pulmonary embolism -diagnosed on 2/4.  PE with segmental and subsegmental in the bilateral lower lobes.  No central PE was seen at that  time. --Eliquis on hold due to hematuria  Non-insulin-dependent type 2 diabetes -takes Tradjenta and Metformin at home. --Continue sliding scale NovoLog and Tradjenta  Essential hypertension -chronic, stable.  Continue metoprolol and losartan.  Dementia without behavioral disturbance -continue Namenda.   --Delirium precautions:     -Lights and TV off, minimize interruptions at night    -Blinds open and lights on during day    -Glasses/hearing aid with patient    -Frequent reorientation    -PT/OT when able    -Avoid sedation medications/Beers list medications  Leukocytosis -improved.  Monitor CBC  History of COVID-19 infection -at outside window for isolation, no precautions needed  Goals of care: Patient has significant cardiac and pulmonary comorbidities in addition to dementia.  Prognosis likely to be less than 6 months.  He is full CODE STATUS.  Prior attending discussed with patient's son, Iantha FallenKenneth, who says he is HCPOA, initially wanted pt to remain full code but has since stated he agrees with DNR. --Palliative care consulted, appreciate assistance   DVT prophylaxis: Place and maintain sequential compression device Start: 01/30/21 1311   Diet:  Diet Orders (From admission, onward)    Start     Ordered   01/31/21 0001  Diet NPO time specified  Diet effective midnight        01/30/21 1039            Code Status: Full Code    Subjective 01/31/21    Patient seen at bedside this morning.  Denies any acute issues including fevers or chills, pain or discomfort, nausea vomiting or trouble breathing.  Requests chocolate milk.   Disposition Plan & Communication  Status is: Inpatient  Remains inpatient appropriate because:Ongoing diagnostic testing needed not appropriate for outpatient work up   Dispo: The patient is from: SNF              Anticipated d/c is to: SNF              Patient currently is not medically stable to d/c.   Difficult to place patient  No    Family Communication: None at bedside, will attempt to call   Consults, Procedures, Significant Events   Consultants:   Urology  Palliative care  Procedures:   None  Antimicrobials:  Anti-infectives (From admission, onward)   Start     Dose/Rate Route Frequency Ordered Stop   01/30/21 1000  cefTRIAXone (ROCEPHIN) 1 g in sodium chloride 0.9 % 100 mL IVPB        1 g 200 mL/hr over 30 Minutes Intravenous Every 24 hours 01/30/21 0745 02/04/21 0959        Micro    Objective   Vitals:   01/30/21 1511 01/30/21 2104 01/31/21 0608 01/31/21 0609  BP: 124/75 (!) 128/112 104/63   Pulse: 83 82 88 90  Resp: 16 17 17    Temp: 98.5 F (36.9 C) (!) 97.5 F (36.4 C) 97.7 F (36.5 C)   TempSrc: Oral Oral Oral   SpO2: 92% 100% (!) 74% 97%    Intake/Output Summary (Last 24 hours) at 01/31/2021 1138 Last data filed at 01/31/2021 0618 Gross per 24 hour  Intake 3760 ml  Output 4001 ml  Net -241 ml   There were no vitals filed for this visit.  Physical Exam:  General exam: awake, alert, no acute distress Respiratory system: CTAB, no wheezes, rales or rhonchi, normal respiratory effort. Cardiovascular system: normal S1/S2, RRR, no pedal edema.   Gastrointestinal system: soft, NT, ND, +bowel sounds. Central nervous system: no gross focal neurologic deficits, normal speech Genitourinary: Foley in place with continuous bladder irrigation running, 8 urines pale yellow transparent   Labs   Data Reviewed: I have personally reviewed following labs and imaging studies  CBC: Recent Labs  Lab 01/29/21 1850 01/30/21 0329 01/31/21 0210  WBC 19.7* 13.0* 10.2  NEUTROABS 14.5*  --  6.8  HGB 12.5* 11.3* 10.1*  HCT 38.1* 32.0* 27.7*  MCV 88.4 83.6 82.0  PLT 58* 58* 65*   Basic Metabolic Panel: Recent Labs  Lab 01/29/21 1850 01/30/21 0329 01/31/21 0210  NA 134* 139 138  K 4.8 4.7 4.1  CL 103 106 106  CO2 21* 26 24  GLUCOSE 189* 177* 125*  BUN 33* 34* 23   CREATININE 1.25* 1.09 0.87  CALCIUM 8.4* 8.8* 8.7*  MG  --   --  1.6*  PHOS  --   --  3.2   GFR: Estimated Creatinine Clearance: 74.4 mL/min (by C-G formula based on SCr of 0.87 mg/dL). Liver Function Tests: Recent Labs  Lab 01/31/21 0210  ALBUMIN 2.6*   No results for input(s): LIPASE, AMYLASE in the last 168 hours. No results for input(s): AMMONIA in the last 168 hours. Coagulation Profile: Recent Labs  Lab 01/29/21 2322  INR 1.3*   Cardiac Enzymes: No results for input(s): CKTOTAL, CKMB, CKMBINDEX, TROPONINI in the last 168 hours. BNP (last 3 results) No results for input(s): PROBNP in the last 8760 hours. HbA1C: Recent Labs    01/31/21 0210  HGBA1C 8.0*   CBG: Recent Labs  Lab 01/30/21 0727 01/30/21 1137 01/30/21 1627 01/30/21 2249 01/31/21 0748  GLUCAP 124*  120* 99 121* 134*   Lipid Profile: No results for input(s): CHOL, HDL, LDLCALC, TRIG, CHOLHDL, LDLDIRECT in the last 72 hours. Thyroid Function Tests: No results for input(s): TSH, T4TOTAL, FREET4, T3FREE, THYROIDAB in the last 72 hours. Anemia Panel: No results for input(s): VITAMINB12, FOLATE, FERRITIN, TIBC, IRON, RETICCTPCT in the last 72 hours. Sepsis Labs: No results for input(s): PROCALCITON, LATICACIDVEN in the last 168 hours.  No results found for this or any previous visit (from the past 240 hour(s)).    Imaging Studies   US PELVIS (TRANSABDOMINAL ONLY)  Result Date: 01/29/2021 CLINICAL DATA:  Gross hematuria. EXAM: PELVIC ULTRASOUND COMPLETE COMPARISON:  None. FINDINGS: Bladder: A Foley catheter is seen within the urinary bladder which measures 3.6 cm x 2.5 cm x 8.3 cm (61.8 cm3). A mild-to-moderate amount of layering heterogeneous echogenic debris is seen within the dependent portion of the bladder lumen. This area measures approximately 8.3 cm x 5.6 cm x 2.5 cm. IMPRESSION: Echogenic debris within the urinary bladder, likely consistent with residual blood products. Electronically Signed    By: Aram Candela M.D.   On: 01/29/2021 22:04   US Renal  Result Date: 01/29/2021 CLINICAL DATA:  Urinary retention, evaluate for bladder clot EXAM: RENAL / URINARY TRACT ULTRASOUND COMPLETE COMPARISON:  None. FINDINGS: Right Kidney: Renal measurements: 10.5 x 5.8 x 5.4 cm = volume: 170 mL. Echogenicity within normal limits. Simple cysts. No mass or hydronephrosis visualized. Left Kidney: Renal measurements: 11.1 x 3 2 x 5.4 cm = volume: 193 mL. Echogenicity within normal limits. No mass or hydronephrosis visualized. Bladder: Large volume of clot and debris within the urinary bladder. Prevoid volume 931 ml. Other: None. IMPRESSION: 1. Large volume of clot and debris within the urinary bladder. Bladder volume 931 mL. 2.  No hydronephrosis. Electronically Signed   By: Lauralyn Primes M.D.   On: 01/29/2021 19:50   DG CHEST PORT 1 VIEW  Result Date: 01/29/2021 CLINICAL DATA:  Leukocytosis EXAM: PORTABLE CHEST 1 VIEW COMPARISON:  01/12/2021 FINDINGS: Single frontal view of the chest demonstrates a stable cardiac silhouette. Chronic ectasia of the thoracic aorta. Fibrotic changes are seen within the lung bases. Marked improvement in the bilateral areas of airspace disease seen on prior study. No effusion or pneumothorax. No acute bony abnormalities. IMPRESSION: 1. Basilar predominant fibrotic changes. 2. Resolution of the bilateral airspace disease seen previously. Electronically Signed   By: Sharlet Salina M.D.   On: 01/29/2021 22:33   CT RENAL STONE STUDY  Result Date: 01/29/2021 CLINICAL DATA:  Hematuria, blood clots in Foley catheter, right inguinal pain EXAM: CT ABDOMEN AND PELVIS WITHOUT CONTRAST TECHNIQUE: Multidetector CT imaging of the abdomen and pelvis was performed following the standard protocol without IV contrast. COMPARISON:  01/29/2021 FINDINGS: Lower chest: Diffuse fibrotic changes are seen throughout the lung bases. No acute pleural or parenchymal lung disease. Unenhanced CT was performed  per clinician order. Lack of IV contrast limits sensitivity and specificity, especially for evaluation of abdominal/pelvic solid viscera. Hepatobiliary: Calcified gallstones layer dependently within the gallbladder. No evidence of acute cholecystitis. Unenhanced imaging of the liver is unremarkable. Pancreas: Unremarkable. No pancreatic ductal dilatation or surrounding inflammatory changes. Spleen: Normal in size without focal abnormality. Adrenals/Urinary Tract: No evidence of urinary tract calculi or obstructive uropathy. There are bilateral simple renal cysts, with a 1.5 cm hyperdense cyst lateral aspect right kidney. These were visualized on preceding renal ultrasound. The adrenals are unremarkable. Foley catheter is seen within the urinary bladder. High attenuation material within the  bladder lumen consistent with blood products seen on recent ultrasound. Stomach/Bowel: No bowel obstruction or ileus. Normal appendix right lower quadrant. Scattered diverticulosis of the distal colon without diverticulitis. No bowel wall thickening or inflammatory change. Vascular/Lymphatic: Aortic atherosclerosis. No enlarged abdominal or pelvic lymph nodes. Reproductive: Prostate is enlarged measuring 6.4 x 6.0 x 5.5 cm. Limited visualization of the scrotum demonstrates a right-sided hydrocele. Other: There is no free fluid or free gas. No abdominal wall hernia. Musculoskeletal: No acute or destructive bony lesions. Extensive spondylosis and facet hypertrophy throughout the lumbar spine. Reconstructed images demonstrate no additional findings. IMPRESSION: 1. High attenuation material within the bladder lumen consistent with blood products identified on recent ultrasound. Foley catheter appears appropriately positioned. 2. Enlarged prostate. 3. Cholelithiasis without cholecystitis. 4. Right-sided hydrocele, partially imaged. 5. Bilateral simple and hyperdense renal cysts, evaluated on preceding ultrasound. 6. Aortic  Atherosclerosis (ICD10-I70.0) and Emphysema (ICD10-J43.9). Electronically Signed   By: Sharlet Salina M.D.   On: 01/29/2021 22:51     Medications   Scheduled Meds: . atorvastatin  40 mg Oral QHS  . Chlorhexidine Gluconate Cloth  6 each Topical Daily  . donepezil  10 mg Oral Daily  . insulin aspart  0-5 Units Subcutaneous QHS  . insulin aspart  0-9 Units Subcutaneous TID WC  . linagliptin  5 mg Oral Daily  . losartan  50 mg Oral Daily  . memantine  28 mg Oral Daily  . metoprolol succinate  50 mg Oral Daily   Continuous Infusions: . cefTRIAXone (ROCEPHIN)  IV 1 g (01/31/21 0941)       LOS: 2 days    Time spent: 30 minutes with greater than 50% spent at bedside and coronation of care    Pennie Banter, DO [facility]  01/31/2021, 11:38 AM      If 7PM-7AM, please contact night-coverage. How to contact the [facility] Attending or Consulting provider 7A - 7P or covering provider during after hours 7P -7A, for this patient?    1. Check the care team in [facility] and look for a) attending/consulting [facility] provider listed and b) the [facility] team listed 2. Log into [email or web address] and use [facility] universal password to access. If you do not have the password, please contact the hospital operator. 3. Locate the [facility] provider you are looking for under [facility] and page to a number that you can be directly reached. 4. If you still have difficulty reaching the provider, please page the [facility] (Director on Call) for the Hospitalists listed on amion for assistance.

## 2021-01-31 NOTE — Progress Notes (Signed)
Transportion to [facility] for  7:35 am pick up.Patient is to return back to [facility] after procedure.

## 2021-01-31 NOTE — Consult Note (Signed)
Consultation Note Date: 01/31/2021   Patient Name: Gary Mcneil  DOB: Apr 12, 1941  MRN: [id number]  Age / Sex: 80 y.o., male  PCP: Audley Hose, MD Referring Physician: Ezekiel Slocumb, DO  Reason for Consultation: Establishing goals of care  HPI/Patient Profile: 80 y.o. male  with past medical history of dementia, CHF EF 20-25%, hypertension, diabetes, recent COVID infection and PE on Eliquis, recent urinary retention admitted on 01/29/2021 with hematuria and pelvis pain with clots and requiring continuous bladder irrigation. Urology planning cystoscopy 02/01/21.   Clinical Assessment and Goals of Care: I met today at Gary Mcneil bedside but no family present. Gary Mcneil is currently very confused and attempting to get out of bed. He has disconnected his foley from bag and irrigation, removed his clothing, pulled out his IV. He says he is trying to get up and get dressed. He does tell me that he believes he is at [facility] and complaints of pain from foley. Unable to tell me anymore about his health condition.   I called and discussed with Gary Mcneil son, Lyan. Gary Mcneil has 1 son and 2 daughters. They have put in place DNR already today. We discussed Gary Mcneil health decline with dementia prior to COVID and multiple complications to his health with multiple admissions to hospital. I worry with his hematuria and being off blood thinners with recent PE. Gary Mcneil is anxious that his father have urology procedure to further assess and hopefully be able to resolve hematuria (this procedure has been rescheduled for tomorrow).   I discussed further with Gary Mcneil goals of care. He is hopeful for resolution of hematuria and return to SNF rehab. They are making plans to proceed with long term care after rehab. I discussed with Gary Mcneil concern for further health complications and progression of dementia. We  discussed MOST form and decisions on form and I have left MOST with Gary Mcneil for him at bedside. Encouraged Gary Mcneil to discuss form and decisions with rest of his family.   All questions/concerns addressed. Emotional support provided.   Primary Decision Maker NEXT OF KIN 3 children    SUMMARY OF RECOMMENDATIONS   - DNR decided - Discussed MOST form - Hopeful for resolution of hematuria and return to SNF rehab  Code Status/Advance Care Planning:  DNR   Symptom Management:   Per attending and urology.   Palliative Prophylaxis:   Aspiration, Bowel Regimen, Delirium Protocol, Frequent Pain Assessment and Turn Reposition   Psycho-social/Spiritual:   Desire for further Chaplaincy support:no  Additional Recommendations: Caregiving  Support/Resources  Prognosis:   < 6 months likely  Discharge Planning: [place] for rehab with Palliative care service follow-up      Primary Diagnoses: Present on Admission: . Essential hypertension . Type 2 diabetes mellitus with diabetic neuropathy ([place]) . Dementia without behavioral disturbance ([place])   I have reviewed the medical record, interviewed the patient and family, and examined the patient. The following aspects are pertinent.  Past Medical History:  Diagnosis Date  . "  walking corpse" syndrome   . Acute kidney failure, unspecified ([place])   . Acute kidney failure, unspecified ([place])   . Acute on chronic combined systolic and diastolic CHF (congestive heart failure) ([place]) 01/12/2021  . Anemia, unspecified   . Arthritis   . Dementia   . Dementia ([place]) 04/24/2012  . Disorder of bone and cartilage, unspecified   . Dizziness and giddiness   . GERD (gastroesophageal reflux disease)   . Gout   . Gout, unspecified   . Hypertension   . Hypopotassemia   . Hypotension, unspecified   . Hypotension, unspecified   . Memory loss   . Memory loss   . Muscle weakness (generalized)   . Myalgia and myositis,  unspecified   . Neuromuscular disorder ([place])   . Other and unspecified hyperlipidemia   . Screening for ischemic heart disease   . Tobacco use disorder   . Type II or unspecified type diabetes mellitus without mention of complication, uncontrolled   . Type II or unspecified type diabetes mellitus without mention of complication, uncontrolled   . Unspecified hereditary and idiopathic peripheral neuropathy   . Unspecified vitamin D deficiency    Social History   Socioeconomic History  . Marital status: Single    Spouse name: Not on file  . Number of children: 3  . Years of education: Not on file  . Highest education level: Not on file  Occupational History  . Occupation: Retired English as a second language teacher from [employer]  . Occupation: Retired Theme park manager  Tobacco Use  . Smoking status: Former Smoker    Quit date: 11/20/1969    Years since quitting: 51.2  . Smokeless tobacco: Never Used  Vaping Use  . Vaping Use: Never used  Substance and Sexual Activity  . Alcohol use: Yes    Comment: daily- to every other day, combination or beer, wine, and liquor.per son patient drinks beer and liquor heavy  . Drug use: Not Currently    Types: Marijuana  . Sexual activity: Not Currently  Other Topics Concern  . Not on file  Social History Narrative   Bachelors in Personnel officer in [place]   Attended [employer] and [employer]            Social Determinants of Health   Financial Resource Strain: Low Risk   . Difficulty of Paying Living Expenses: Not Gary at all  Food Insecurity: No Food Insecurity  . Worried About Charity fundraiser in the Last Year: Never true  . Ran Out of Food in the Last Year: Never true  Transportation Needs: No Transportation Needs  . Lack of Transportation (Medical): No  . Lack of Transportation (Non-Medical): No  Physical Activity: Not on file  Stress: Not on file  Social Connections: Not on file   Family History  Problem Relation Age of Onset  . Diabetes Mother   .  Hypertension Mother   . Diabetes Father   . Hypertension Father   . Kidney disease Father   . Diabetes Sister   . Kidney disease Brother    Scheduled Meds: . atorvastatin  40 mg Oral QHS  . Chlorhexidine Gluconate Cloth  6 each Topical Daily  . donepezil  10 mg Oral Daily  . insulin aspart  0-5 Units Subcutaneous QHS  . insulin aspart  0-9 Units Subcutaneous TID WC  . linagliptin  5 mg Oral Daily  . losartan  50 mg Oral Daily  . memantine  28 mg Oral Daily  . metoprolol succinate  50 mg Oral Daily   Continuous Infusions: . cefTRIAXone (ROCEPHIN)  IV 1 g (01/31/21 0941)   PRN Meds:.acetaminophen **OR** acetaminophen, albuterol, HYDROmorphone (DILAUDID) injection Allergies  Allergen Reactions  . Penicillins Rash   Review of Systems  Unable to perform ROS: Dementia    Physical Exam Vitals and nursing note reviewed.  Constitutional:      General: He is awake. He is not in acute distress.    Appearance: He is ill-appearing.  Pulmonary:     Effort: Pulmonary effort is normal. No tachypnea, accessory muscle usage or respiratory distress.  Abdominal:     General: Abdomen is flat.     Palpations: Abdomen is soft.  Neurological:     Mental Status: He is alert. He is confused.     Vital Signs: BP 104/63 (BP Location: Right Arm)   Pulse 90   Temp 97.7 F (36.5 C) (Oral)   Resp 17   SpO2 97%  Pain Scale: 0-10   Pain Score: 3    SpO2: SpO2: 97 % O2 Device:SpO2: 97 % O2 Flow Rate: .O2 Flow Rate (L/min): 3 L/min  IO: Intake/output summary:   Intake/Output Summary (Last 24 hours) at 01/31/2021 1110 Last data filed at 01/31/2021 2080 Gross per 24 hour  Intake 3760 ml  Output 4001 ml  Net -241 ml    LBM: Last BM Date: 01/28/21 Baseline Weight:   Most recent weight:       Palliative Assessment/Data:     Time In: 1400 Time Out: 1450 Time Total: 50 min Greater than 50%  of this time was spent counseling and coordinating care related to the above assessment and  plan.  Signed by: Vinie Sill, NP Palliative Medicine Team Pager # [phone] (M-F 8a-5p) Team Phone # [phone] (Nights/Weekends)

## 2021-01-31 NOTE — Progress Notes (Addendum)
Day of Surgery Subjective: NAEON. AFHDS. Hgb slightly downtrended to 10.1 today from 11.3 yesterday. Cr normalized.   Urine currently clear on slow drip CBI this morning. Irrigated catheter at bedside without return of clot. C/f persistent, well-formed clot that we are unable to remove just by bedside irrigation. NPO since MN for clot evacuation today under GA  Objective: Vital signs in last 24 hours: Temp:  [97.5 F (36.4 C)-98.5 F (36.9 C)] 97.7 F (36.5 C) (03/14 7564) Pulse Rate:  [80-90] 90 (03/14 0609) Resp:  [16-17] 17 (03/14 0608) BP: (104-128)/(61-112) 104/63 (03/14 0608) SpO2:  [74 %-100 %] 97 % (03/14 0609)  Intake/Output from previous day: 03/13 0701 - 03/14 0700 In: 3760 [P.O.:660; IV Piggyback:100] Out: 4001 [Urine:4000; Stool:1] Intake/Output this shift: No intake/output data recorded.  Physical Exam:  General: Alert and oriented CV: Regular rate Lungs: NWOB on RA GU: 22 Fr 3-way hematuria catheter in place draining clear urine on slow drip CBI Ext: Warm and well-perfused   Lab Results: Recent Labs    01/29/21 1850 01/30/21 0329 01/31/21 0210  HGB 12.5* 11.3* 10.1*  HCT 38.1* 32.0* 27.7*   BMET Recent Labs    01/30/21 0329 01/31/21 0210  NA 139 138  K 4.7 4.1  CL 106 106  CO2 26 24  GLUCOSE 177* 125*  BUN 34* 23  CREATININE 1.09 0.87  CALCIUM 8.8* 8.7*     Studies/Results: US PELVIS (TRANSABDOMINAL ONLY)  Result Date: 01/29/2021 CLINICAL DATA:  Gross hematuria. EXAM: PELVIC ULTRASOUND COMPLETE COMPARISON:  None. FINDINGS: Bladder: A Foley catheter is seen within the urinary bladder which measures 3.6 cm x 2.5 cm x 8.3 cm (61.8 cm3). A mild-to-moderate amount of layering heterogeneous echogenic debris is seen within the dependent portion of the bladder lumen. This area measures approximately 8.3 cm x 5.6 cm x 2.5 cm. IMPRESSION: Echogenic debris within the urinary bladder, likely consistent with residual blood products. Electronically Signed    By: Aram Candela M.D.   On: 01/29/2021 22:04   US Renal  Result Date: 01/29/2021 CLINICAL DATA:  Urinary retention, evaluate for bladder clot EXAM: RENAL / URINARY TRACT ULTRASOUND COMPLETE COMPARISON:  None. FINDINGS: Right Kidney: Renal measurements: 10.5 x 5.8 x 5.4 cm = volume: 170 mL. Echogenicity within normal limits. Simple cysts. No mass or hydronephrosis visualized. Left Kidney: Renal measurements: 11.1 x 3 2 x 5.4 cm = volume: 193 mL. Echogenicity within normal limits. No mass or hydronephrosis visualized. Bladder: Large volume of clot and debris within the urinary bladder. Prevoid volume 931 ml. Other: None. IMPRESSION: 1. Large volume of clot and debris within the urinary bladder. Bladder volume 931 mL. 2.  No hydronephrosis. Electronically Signed   By: Lauralyn Primes M.D.   On: 01/29/2021 19:50   DG CHEST PORT 1 VIEW  Result Date: 01/29/2021 CLINICAL DATA:  Leukocytosis EXAM: PORTABLE CHEST 1 VIEW COMPARISON:  01/12/2021 FINDINGS: Single frontal view of the chest demonstrates a stable cardiac silhouette. Chronic ectasia of the thoracic aorta. Fibrotic changes are seen within the lung bases. Marked improvement in the bilateral areas of airspace disease seen on prior study. No effusion or pneumothorax. No acute bony abnormalities. IMPRESSION: 1. Basilar predominant fibrotic changes. 2. Resolution of the bilateral airspace disease seen previously. Electronically Signed   By: Sharlet Salina M.D.   On: 01/29/2021 22:33   CT RENAL STONE STUDY  Result Date: 01/29/2021 CLINICAL DATA:  Hematuria, blood clots in Foley catheter, right inguinal pain EXAM: CT ABDOMEN AND PELVIS WITHOUT CONTRAST  TECHNIQUE: Multidetector CT imaging of the abdomen and pelvis was performed following the standard protocol without IV contrast. COMPARISON:  01/29/2021 FINDINGS: Lower chest: Diffuse fibrotic changes are seen throughout the lung bases. No acute pleural or parenchymal lung disease. Unenhanced CT was performed  per clinician order. Lack of IV contrast limits sensitivity and specificity, especially for evaluation of abdominal/pelvic solid viscera. Hepatobiliary: Calcified gallstones layer dependently within the gallbladder. No evidence of acute cholecystitis. Unenhanced imaging of the liver is unremarkable. Pancreas: Unremarkable. No pancreatic ductal dilatation or surrounding inflammatory changes. Spleen: Normal in size without focal abnormality. Adrenals/Urinary Tract: No evidence of urinary tract calculi or obstructive uropathy. There are bilateral simple renal cysts, with a 1.5 cm hyperdense cyst lateral aspect right kidney. These were visualized on preceding renal ultrasound. The adrenals are unremarkable. Foley catheter is seen within the urinary bladder. High attenuation material within the bladder lumen consistent with blood products seen on recent ultrasound. Stomach/Bowel: No bowel obstruction or ileus. Normal appendix right lower quadrant. Scattered diverticulosis of the distal colon without diverticulitis. No bowel wall thickening or inflammatory change. Vascular/Lymphatic: Aortic atherosclerosis. No enlarged abdominal or pelvic lymph nodes. Reproductive: Prostate is enlarged measuring 6.4 x 6.0 x 5.5 cm. Limited visualization of the scrotum demonstrates a right-sided hydrocele. Other: There is no free fluid or free gas. No abdominal wall hernia. Musculoskeletal: No acute or destructive bony lesions. Extensive spondylosis and facet hypertrophy throughout the lumbar spine. Reconstructed images demonstrate no additional findings. IMPRESSION: 1. High attenuation material within the bladder lumen consistent with blood products identified on recent ultrasound. Foley catheter appears appropriately positioned. 2. Enlarged prostate. 3. Cholelithiasis without cholecystitis. 4. Right-sided hydrocele, partially imaged. 5. Bilateral simple and hyperdense renal cysts, evaluated on preceding ultrasound. 6. Aortic  Atherosclerosis (ICD10-I70.0) and Emphysema (ICD10-J43.9). Electronically Signed   By: Sharlet Salina M.D.   On: 01/29/2021 22:51    Assessment/Plan: Gary Mcneil is a 80 y.o. 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 He was incidentally found to have urinary retention and AKI during hospitalization c/b foley trauma and hematuria requiring urology to scope in a hematuria catheter on 2/27 with brief period on CBI. He was discharged with the foley, which was removed in urology clinic on 3/4. It is unclear if a foley was replaced at his SNF, but he represented to the ED on 3/12 in clot retention with palpable bladder on exam (931cc in bladder on ultrasound) and large bladder clot on imaging. Urology placed a 22 Fr 3-way hematuria catheter, manually irrigated bladder, and started CBI.  Patient has a persistent large bladder clot on imaging that we are unable to remove via bedside irrigation. He has remained AFHDS with fairly stable Hgb, so there is lower concern for active bleeding, rather he likely has a well-formed clot that may have been there for some time. Given his large clot, we recommend proceeding with cystourethroscopy and clot evacuation +/- fulguration today. We will also perform bilateral retrograde pyelograms to complete hematuria workup.   - Please keep patient NPO at MN for above procedure today  - Continue CBI. Titrate CBI to achieve pink urine. If foley catheter stops draining, please stop CBI, disconnect foley from tubing, and manually irrigate the foley with a toomey syringe to relieve any possible obstructing clots. If unable to flush  foley catheter, please page urology - Continue foley catheter to drainage. Patient will need foley catheter  for at least 10-14 days for bladder rest given bladder stretch injury. We will arrange appropriate outpatient follow up pending his clinical course - Recommend platelet transfusion for thrombocytopenia </= 50 - We will continue to follow along     LOS: 2 days   Hannah McCloskey 01/31/2021, 7:22 AM  I have seen and examined the patient and agree with the above assessment and plan.  Urine remains clear. Irrigates without difficulty. Planning for OR tomorrow for cysto, b/l RPG, clot evacuation, possible bladder biopsy, possible TURBT. NPO after midnight.  Matt R. Lateisha Thurlow MD [facility]  Pager: [phone]      

## 2021-01-31 NOTE — Evaluation (Signed)
Physical Therapy Evaluation Patient Details Name: Gary Mcneil MRN: [id number] DOB: 09-12-1941 Today's Date: 01/31/2021   History of Present Illness  Gary Mcneil is a 80 y.o. male admitted on 3/12 with blood in urine and pelvic pain PMH: dementia, HTN, DM2, HFrEF with EF 30-35% as of earlier this month, heavy EtOH use / abuse who was admitted last month with COVID-19 and PE.    Clinical Impression  Pt admitted with above. Suspect pt is functioning near baseline. Pt approriate to return to SNF as pt is min/min guard assist. Pt with poor cognition and is unable to manage self at home. Pt commented about this PT doing something everyday. PT stated, this is the first time I met you, pt then stated, "you said that yesterday too". Pt unaware of situation, date, or place. Acute PT to continue to follow.    Follow Up Recommendations SNF;Supervision/Assistance - 24 hour    Equipment Recommendations   (has DME at [facility])    Recommendations for Other Services       Precautions / Restrictions Precautions Precautions: Fall Precaution Comments: catheter with 2 foleys and irrigation Restrictions Weight Bearing Restrictions: No      Mobility  Bed Mobility Overal bed mobility: Needs Assistance Bed Mobility: Sit to Supine       Sit to supine: Min assist   General bed mobility comments: pt received sitting up in chair, pt requiring minA for LE management back into bed due to R hip/groin pain    Transfers Overall transfer level: Needs assistance Equipment used: Rolling walker (2 wheeled) Transfers: Sit to/from [facility] Sit to Stand: Min assist Stand pivot transfers: Min assist       General transfer comment: minA to power up and steady during transition of hands from arm rests to RW, line management also provided  Ambulation/Gait             General Gait Details: pt refused to amb due to R groin pain  Stairs            Wheelchair Mobility     Modified Rankin (Stroke Patients Only)       Balance Overall balance assessment: Needs assistance Sitting-balance support: No upper extremity supported;Feet supported Sitting balance-Leahy Scale: Fair     Standing balance support: Bilateral upper extremity supported Standing balance-Leahy Scale: Poor Standing balance comment: Reliant on UE and external support                             Pertinent Vitals/Pain Pain Assessment: No/denies pain    Home Living Family/patient expects to be discharged to:: Skilled nursing facility                 Additional Comments: pt has resided in SNF since last hospital admission, per previous admission pt was home alone. Pt poor historian as pt states he lives at [facility] and has been for 28years    Prior Function Level of Independence: Needs assistance   Gait / Transfers Assistance Needed: pt reports using RW at SNF  ADL's / Homemaking Assistance Needed: suspect he requires assist  Comments: pt poor historian, unsure of accuracy     Hand Dominance   Dominant Hand: Right    Extremity/Trunk Assessment   Upper Extremity Assessment Upper Extremity Assessment: Generalized weakness    Lower Extremity Assessment Lower Extremity Assessment: Generalized weakness    Cervical / Trunk Assessment Cervical / Trunk Assessment: Normal  Communication   Communication: No difficulties  Cognition Arousal/Alertness: Awake/alert Behavior During Therapy: WFL for tasks assessed/performed Overall Cognitive Status: History of cognitive impairments - at baseline                                 General Comments: pt with dementia at baseline, pt only oriented to self, pt with decreased safety awareness - unaware of multiple catheter bags      General Comments General comments (skin integrity, edema, etc.): noted SOB with std pvt to chair    Exercises     Assessment/Plan    PT Assessment Patient needs  continued PT services  PT Problem List Decreased strength;Decreased activity tolerance;Decreased balance;Decreased mobility;Decreased coordination;Decreased cognition;Decreased knowledge of use of DME;Decreased safety awareness       PT Treatment Interventions DME instruction;Gait training;Functional mobility training;Therapeutic activities;Therapeutic exercise;Balance training;Neuromuscular re-education    PT Goals (Current goals can be found in the Care Plan section)  Acute Rehab PT Goals Patient Stated Goal: didn't state PT Goal Formulation: With patient Time For Goal Achievement: 01/27/21 Potential to Achieve Goals: Good    Frequency Min 2X/week   Barriers to discharge        Co-evaluation               AM-PAC PT "6 Clicks" Mobility  Outcome Measure Help needed turning from your back to your side while in a flat bed without using bedrails?: A Little Help needed moving from lying on your back to sitting on the side of a flat bed without using bedrails?: A Little Help needed moving to and from a bed to a chair (including a wheelchair)?: A Little Help needed standing up from a chair using your arms (e.g., wheelchair or bedside chair)?: A Little Help needed to walk in hospital room?: A Little Help needed climbing 3-5 steps with a railing? : A Lot 6 Click Score: 17    End of Session Equipment Utilized During Treatment: Oxygen;Gait belt Activity Tolerance: Patient limited by fatigue Patient left: in bed;with call bell/phone within reach;with bed alarm set Nurse Communication: Mobility status PT Visit Diagnosis: Unsteadiness on feet (R26.81);Muscle weakness (generalized) (M62.81);Difficulty in walking, not elsewhere classified (R26.2)    Time: 2951-8841 PT Time Calculation (min) (ACUTE ONLY): 22 min   Charges:   PT Evaluation $PT Eval Low Complexity: 1 Low          Kittie Plater, PT, DPT Acute Rehabilitation Services Pager #: [phone] Office #:  [phone]   Berline Lopes 01/31/2021, 1:14 PM

## 2021-02-01 ENCOUNTER — Inpatient Hospital Stay (HOSPITAL_COMMUNITY): Payer: Medicare Other | Admitting: Certified Registered Nurse Anesthetist

## 2021-02-01 ENCOUNTER — Encounter (HOSPITAL_COMMUNITY): Payer: Self-pay | Admitting: Family Medicine

## 2021-02-01 ENCOUNTER — Encounter (HOSPITAL_COMMUNITY)
Admission: EM | Disposition: A | Discharge status: Skilled Nursing Facility | Payer: Self-pay | Source: Skilled Nursing Facility | Attending: Internal Medicine

## 2021-02-01 DIAGNOSIS — I10 Essential (primary) hypertension: Secondary | ICD-10-CM | POA: Diagnosis not present

## 2021-02-01 DIAGNOSIS — R102 Pelvic and perineal pain: Secondary | ICD-10-CM

## 2021-02-01 DIAGNOSIS — I5020 Unspecified systolic (congestive) heart failure: Secondary | ICD-10-CM | POA: Diagnosis not present

## 2021-02-01 DIAGNOSIS — F0390 Unspecified dementia without behavioral disturbance: Secondary | ICD-10-CM | POA: Diagnosis not present

## 2021-02-01 DIAGNOSIS — R319 Hematuria, unspecified: Secondary | ICD-10-CM | POA: Diagnosis not present

## 2021-02-01 DIAGNOSIS — Z7189 Other specified counseling: Secondary | ICD-10-CM | POA: Diagnosis not present

## 2021-02-01 DIAGNOSIS — Z515 Encounter for palliative care: Secondary | ICD-10-CM | POA: Diagnosis not present

## 2021-02-01 HISTORY — PX: CYSTOSCOPY: SHX5120

## 2021-02-01 LAB — GLUCOSE, CAPILLARY
Glucose-Capillary: 101 mg/dL — ABNORMAL HIGH (ref 70–99)
Glucose-Capillary: 94 mg/dL (ref 70–99)
Glucose-Capillary: 108 mg/dL — ABNORMAL HIGH (ref 70–99)
Glucose-Capillary: 128 mg/dL — ABNORMAL HIGH (ref 70–99)
Glucose-Capillary: 197 mg/dL — ABNORMAL HIGH (ref 70–99)
Glucose-Capillary: 164 mg/dL — ABNORMAL HIGH (ref 70–99)

## 2021-02-01 LAB — CBC
HCT: 30.3 % — ABNORMAL LOW (ref 39.0–52.0)
Hemoglobin: 10.3 g/dL — ABNORMAL LOW (ref 13.0–17.0)
MCH: 28.9 pg (ref 26.0–34.0)
MCHC: 34 g/dL (ref 30.0–36.0)
MCV: 84.9 fL (ref 80.0–100.0)
Platelets: 68 10*3/uL — ABNORMAL LOW (ref 150–400)
RBC: 3.57 MIL/uL — ABNORMAL LOW (ref 4.22–5.81)
RDW: 15.5 % (ref 11.5–15.5)
WBC: 8.7 10*3/uL (ref 4.0–10.5)
nRBC: 0 % (ref 0.0–0.2)

## 2021-02-01 LAB — TYPE AND SCREEN
ABO/RH(D): A POS
Antibody Screen: NEGATIVE

## 2021-02-01 SURGERY — CYSTOSCOPY
Anesthesia: General

## 2021-02-01 MED ORDER — ONDANSETRON HCL 4 MG/2ML IJ SOLN
INTRAMUSCULAR | Status: DC | PRN
  Administered 2021-02-01: 4 mg via INTRAVENOUS

## 2021-02-01 MED ORDER — DEXTROSE 5 % IV SOLN
INTRAVENOUS | Status: DC | PRN
  Administered 2021-02-01: 1 g via INTRAVENOUS

## 2021-02-01 MED ORDER — HYDROMORPHONE HCL 1 MG/ML IJ SOLN
INTRAMUSCULAR | Status: AC
  Administered 2021-02-01: 0.5 mg via INTRAVENOUS
  Filled 2021-02-01: qty 1

## 2021-02-01 MED ORDER — BELLADONNA ALKALOIDS-OPIUM 16.2-60 MG RE SUPP
1.0000 | Freq: Once | RECTAL | Status: AC
  Administered 2021-02-01: 1 via RECTAL

## 2021-02-01 MED ORDER — DEXAMETHASONE SODIUM PHOSPHATE 4 MG/ML IJ SOLN
INTRAMUSCULAR | Status: DC | PRN
  Administered 2021-02-01: 5 mg via INTRAVENOUS

## 2021-02-01 MED ORDER — FENTANYL CITRATE (PF) 100 MCG/2ML IJ SOLN
INTRAMUSCULAR | Status: AC
  Filled 2021-02-01: qty 2

## 2021-02-01 MED ORDER — FINASTERIDE 5 MG PO TABS: 5.0000 mg | ORAL_TABLET | Freq: Every day | ORAL | 3 refills | Status: AC

## 2021-02-01 MED ORDER — OXYBUTYNIN CHLORIDE 5 MG PO TABS
5.0000 mg | ORAL_TABLET | Freq: Once | ORAL | Status: AC
  Administered 2021-02-01: 10 mg via ORAL
  Filled 2021-02-01: qty 2

## 2021-02-01 MED ORDER — TAMSULOSIN HCL 0.4 MG PO CAPS
0.4000 mg | ORAL_CAPSULE | Freq: Every day | ORAL | Status: DC
  Administered 2021-02-01: 0.4 mg via ORAL
  Filled 2021-02-01: qty 1

## 2021-02-01 MED ORDER — PROPOFOL 10 MG/ML IV BOLUS
INTRAVENOUS | Status: DC | PRN
  Administered 2021-02-01: 30 mg via INTRAVENOUS
  Administered 2021-02-01: 70 mg via INTRAVENOUS

## 2021-02-01 MED ORDER — FINASTERIDE 5 MG PO TABS
5.0000 mg | ORAL_TABLET | Freq: Every day | ORAL | Status: DC
Start: 2021-02-01 — End: 2021-02-03
  Administered 2021-02-01 – 2021-02-02 (×2): 5 mg via ORAL
  Filled 2021-02-01 (×2): qty 1

## 2021-02-01 MED ORDER — LACTATED RINGERS IV SOLN: INTRAVENOUS | Status: DC

## 2021-02-01 MED ORDER — PHENYLEPHRINE 40 MCG/ML (10ML) SYRINGE FOR IV PUSH (FOR BLOOD PRESSURE SUPPORT)
PREFILLED_SYRINGE | INTRAVENOUS | Status: AC
  Filled 2021-02-01: qty 10

## 2021-02-01 MED ORDER — ONDANSETRON HCL 4 MG/2ML IJ SOLN
INTRAMUSCULAR | Status: AC
  Filled 2021-02-01: qty 2

## 2021-02-01 MED ORDER — PHENYLEPHRINE 40 MCG/ML (10ML) SYRINGE FOR IV PUSH (FOR BLOOD PRESSURE SUPPORT)
PREFILLED_SYRINGE | INTRAVENOUS | Status: DC | PRN
  Administered 2021-02-01 (×2): 120 ug via INTRAVENOUS
  Administered 2021-02-01: 80 ug via INTRAVENOUS
  Administered 2021-02-01: 160 ug via INTRAVENOUS
  Administered 2021-02-01: 80 ug via INTRAVENOUS
  Administered 2021-02-01: 120 ug via INTRAVENOUS

## 2021-02-01 MED ORDER — SUGAMMADEX SODIUM 200 MG/2ML IV SOLN
INTRAVENOUS | Status: DC | PRN
  Administered 2021-02-01: 400 mg via INTRAVENOUS

## 2021-02-01 MED ORDER — PROMETHAZINE HCL 25 MG/ML IJ SOLN: 6.2500 mg | INTRAMUSCULAR | Status: DC | PRN

## 2021-02-01 MED ORDER — DEXAMETHASONE SODIUM PHOSPHATE 10 MG/ML IJ SOLN
INTRAMUSCULAR | Status: AC
  Filled 2021-02-01: qty 1

## 2021-02-01 MED ORDER — ROCURONIUM BROMIDE 10 MG/ML (PF) SYRINGE
PREFILLED_SYRINGE | INTRAVENOUS | Status: DC | PRN
  Administered 2021-02-01: 50 mg via INTRAVENOUS

## 2021-02-01 MED ORDER — OXYCODONE HCL 5 MG PO TABS: 5.0000 mg | ORAL_TABLET | Freq: Once | ORAL | Status: DC | PRN

## 2021-02-01 MED ORDER — MEPERIDINE HCL 50 MG/ML IJ SOLN: 6.2500 mg | INTRAMUSCULAR | Status: DC | PRN

## 2021-02-01 MED ORDER — HYDROMORPHONE HCL 1 MG/ML IJ SOLN
0.2500 mg | INTRAMUSCULAR | Status: DC | PRN
  Administered 2021-02-01 (×2): 0.5 mg via INTRAVENOUS

## 2021-02-01 MED ORDER — EPHEDRINE 5 MG/ML INJ
INTRAVENOUS | Status: AC
  Filled 2021-02-01: qty 10

## 2021-02-01 MED ORDER — SODIUM CHLORIDE 0.9 % IR SOLN
Status: DC | PRN
  Administered 2021-02-01: 3000 mL

## 2021-02-01 MED ORDER — OXYCODONE HCL 5 MG/5ML PO SOLN: 5.0000 mg | Freq: Once | ORAL | Status: DC | PRN

## 2021-02-01 MED ORDER — BELLADONNA ALKALOIDS-OPIUM 16.2-60 MG RE SUPP
RECTAL | Status: AC
  Filled 2021-02-01: qty 1

## 2021-02-01 MED ORDER — PROPOFOL 500 MG/50ML IV EMUL
INTRAVENOUS | Status: AC
  Filled 2021-02-01: qty 50

## 2021-02-01 MED ORDER — MIDAZOLAM HCL 2 MG/2ML IJ SOLN: 0.5000 mg | Freq: Once | INTRAMUSCULAR | Status: DC | PRN

## 2021-02-01 MED ORDER — FENTANYL CITRATE (PF) 100 MCG/2ML IJ SOLN
INTRAMUSCULAR | Status: DC | PRN
  Administered 2021-02-01 (×2): 50 ug via INTRAVENOUS

## 2021-02-01 MED ORDER — LIDOCAINE 2% (20 MG/ML) 5 ML SYRINGE
INTRAMUSCULAR | Status: DC | PRN
  Administered 2021-02-01: 30 mg via INTRAVENOUS

## 2021-02-01 MED ORDER — HYDROMORPHONE HCL 1 MG/ML IJ SOLN: 0.2500 mg | INTRAMUSCULAR | Status: DC | PRN

## 2021-02-01 MED ORDER — LIDOCAINE 2% (20 MG/ML) 5 ML SYRINGE
INTRAMUSCULAR | Status: AC
  Filled 2021-02-01: qty 5

## 2021-02-01 MED ORDER — ROCURONIUM BROMIDE 10 MG/ML (PF) SYRINGE
PREFILLED_SYRINGE | INTRAVENOUS | Status: AC
  Filled 2021-02-01: qty 10

## 2021-02-01 SURGICAL SUPPLY — 32 items
BAG DRN RND TRDRP ANRFLXCHMBR (UROLOGICAL SUPPLIES) ×2
BAG URINE DRAIN 2000ML AR STRL (UROLOGICAL SUPPLIES) ×3 IMPLANT
BAG URO CATCHER STRL LF (MISCELLANEOUS) ×3 IMPLANT
BASKET ZERO TIP NITINOL 2.4FR (BASKET) IMPLANT
BSKT STON RTRVL ZERO TP 2.4FR (BASKET)
CATH INTERMIT  6FR 70CM (CATHETERS) IMPLANT
CLOTH BEACON ORANGE TIMEOUT ST (SAFETY) ×3 IMPLANT
COVER WAND RF STERILE (DRAPES) IMPLANT
DRAPE FOOT SWITCH (DRAPES) ×3 IMPLANT
EVACUATOR MICROVAS BLADDER (UROLOGICAL SUPPLIES) ×3 IMPLANT
GLOVE SURG ENC TEXT LTX SZ7.5 (GLOVE) ×3 IMPLANT
GOWN STRL REUS W/TWL LRG LVL3 (GOWN DISPOSABLE) ×3 IMPLANT
GOWN STRL REUS W/TWL XL LVL3 (GOWN DISPOSABLE) ×3 IMPLANT
GUIDEWIRE ANG ZIPWIRE 038X150 (WIRE) IMPLANT
GUIDEWIRE STR DUAL SENSOR (WIRE) ×3 IMPLANT
HOLDER FOLEY CATH W/STRAP (MISCELLANEOUS) IMPLANT
KIT TURNOVER KIT A (KITS) ×3 IMPLANT
LASER FIB FLEXIVA PULSE ID 365 (Laser) IMPLANT
LASER FIB FLEXIVA PULSE ID 550 (Laser) IMPLANT
LASER FIB FLEXIVA PULSE ID 910 (Laser) IMPLANT
LOOP CUT BIPOLAR 24F LRG (ELECTROSURGICAL) IMPLANT
MANIFOLD NEPTUNE II (INSTRUMENTS) ×3 IMPLANT
NS IRRIG 1000ML POUR BTL (IV SOLUTION) IMPLANT
PACK CYSTO (CUSTOM PROCEDURE TRAY) ×3 IMPLANT
PENCIL SMOKE EVACUATOR (MISCELLANEOUS) IMPLANT
SYR 30ML LL (SYRINGE) IMPLANT
SYR TOOMEY IRRIG 70ML (MISCELLANEOUS) ×3
SYRINGE TOOMEY IRRIG 70ML (MISCELLANEOUS) ×2 IMPLANT
TRACTIP FLEXIVA PULS ID 200XHI (Laser) IMPLANT
TRACTIP FLEXIVA PULSE ID 200 (Laser)
TUBING CONNECTING 10 (TUBING) ×3 IMPLANT
TUBING UROLOGY SET (TUBING) ×3 IMPLANT

## 2021-02-01 NOTE — TOC Progression Note (Signed)
Transition of Care [facility]) - Progression Note    Patient Details  Name: Gary Mcneil MRN: [id number] Date of Birth: [birth year]  [facility] of [facility]) CM/SW Contact  Jimmy Picket, [place] Phone Number: 02/01/2021, 4:53 PM  Clinical Narrative:     CSW called [facility] and confirmed they can accept pt tomorrow. CSW requested covid test from MD.        Expected Discharge Plan and Services                                                 Social Determinants of Health (SDOH) Interventions    Readmission Risk Interventions Readmission Risk Prevention Plan 12/27/2020  Transportation Screening Complete  PCP or Specialist Appt within 5-7 Days Complete  Home Care Screening Complete  Medication Review (RN CM) Referral to Pharmacy  Some recent data might be hidden   Jimmy Picket, Theresia Majors, [facility] Clinical Social Worker [phone]

## 2021-02-01 NOTE — Transfer of Care (Signed)
Immediate Anesthesia Transfer of Care Note  Patient: Gary Mcneil  Procedure(s) Performed: CYSTOSCOPY WITH FULGERATION (N/A )  Patient Location: PACU  Anesthesia Type:General  Level of Consciousness: drowsy  Airway & Oxygen Therapy: Patient Spontanous Breathing and Patient connected to face mask  Post-op Assessment: Report given to RN and Post -op Vital signs reviewed and stable  Post vital signs: Reviewed and stable  Last Vitals:  Vitals Value Taken Time  BP 111/66 02/01/21 1035  Temp    Pulse 81 02/01/21 1039  Resp 19 02/01/21 1039  SpO2 90 % 02/01/21 1039  Vitals shown include unvalidated device data.  Last Pain:  Vitals:   02/01/21 0905  TempSrc: Oral  PainSc:       Patients Stated Pain Goal: 1 (01/31/21 2347)  Complications: No complications documented.

## 2021-02-01 NOTE — Anesthesia Preprocedure Evaluation (Addendum)
Anesthesia Evaluation  Patient identified by MRN, date of birth, ID band Patient awake    Reviewed: Allergy & Precautions, NPO status , Patient's Chart, lab work & pertinent test results, reviewed documented beta blocker date and time   History of Anesthesia Complications Negative for: history of anesthetic complications  Airway Mallampati: I  TM Distance: >3 FB Neck ROM: Full    Dental  (+) Poor Dentition, Dental Advisory Given, Missing   Pulmonary COPD (O2 dependent since COVID),  oxygen dependent, former smoker,  12/24/2020 SARS coronavirus POS   breath sounds clear to auscultation       Cardiovascular hypertension, Pt. on medications and Pt. on home beta blockers (-) angina Rhythm:Regular Rate:Normal  01/12/2021 ECHO: EF 20-25%, global hypokinesis, mild RV hypokinesis, mild AI, but no significant valvular abnormalities   Neuro/Psych Schizophrenia Dementia negative neurological ROS     GI/Hepatic GERD  Medicated and Controlled,Elevated LFTs   Endo/Other  diabetes (glu 94), Oral Hypoglycemic Agents  Renal/GU Renal InsufficiencyRenal disease     Musculoskeletal  (+) Arthritis ,   Abdominal   Peds  Hematology  (+) Blood dyscrasia (Hb 10.3), anemia , Eliquis: holding for hematuria Thrombocytopenia: plt 62k   Anesthesia Other Findings   Reproductive/Obstetrics                           Anesthesia Physical Anesthesia Plan  ASA: III  Anesthesia Plan: General   Post-op Pain Management:    Induction: Intravenous  PONV Risk Score and Plan: 3 and Ondansetron, Dexamethasone and Treatment may vary due to age or medical condition  Airway Management Planned: Oral ETT  Additional Equipment: None  Intra-op Plan:   Post-operative Plan: Extubation in OR  Informed Consent: I have reviewed the patients History and Physical, chart, labs and discussed the procedure including the risks, benefits and  alternatives for the proposed anesthesia with the patient or authorized representative who has indicated his/her understanding and acceptance.   Patient has DNR.  Discussed DNR with patient, Discussed DNR with power of attorney and Suspend DNR.   Dental advisory given and Consent reviewed with POA  Plan Discussed with: CRNA and Surgeon  Anesthesia Plan Comments: (Consent from both pt and then his son by telephone)       Anesthesia Quick Evaluation

## 2021-02-01 NOTE — Anesthesia Procedure Notes (Signed)
Performed by: Caleesi Kohl Glenn, CRNA       

## 2021-02-01 NOTE — Op Note (Addendum)
Preoperative diagnosis:  - Bladder clot  Postoperative diagnosis: - Bladder clot - BPH  Procedure(s): 1. Cystourethroscopy with clot evacuation and fulguration  Surgeon: Jerilee Field, MD  Resident Surgeon: Margette Fast, MD  Anesthesia: General  Complications: None  EBL: 1cc  Specimens: None  Disposition of specimens: N/A  Intraoperative findings:  - Normal anterior urethra without injury. Large prostatic urethra with kissing lateral lobes and high riding bladder neck with friable, vascular tissue along bladder neck that was actively, mildly oozing along the 5-7 o'clock position. This area was fulgurated with excellent hemostasis. Actively oozing areas on the right lateral/anterior prostatic urethra were also fulgurated with excellent hemostasis.  - Orthotopic bilateral ureteral orifices. Moderate bladder trabeculations with small diverticula noted on right posterior wall. Several medium-sized clots that were evacuated. Areas of edema and hyperemia along posterior wall c/w chronic foley. Large capacity bladder without stones, tumors, or masses noted  -EUA-penis circumcised and without mass, glans and meatus normal. Scrotum normal. Testicles descended bilaterally and palpably normal. DRE - prostate about 60 g and smooth without hard area or nodule.   Indication:  Gary Mcneil is a 80 y.o. male with history of dementia, HTN, DM, HFrEF with EF 30-35%, heavy EtOH use/abuse, and PE (diagnosed 12/24/20) on Eliquis who was recently admitted from 2/22 to 01/18/21 for acute hypoxic respiratory failure in the setting of heart failure exacerbation with questionable underlying pneumonia. He was incidentally found to have urinary retention and AKI during hospitalization c/b foley trauma and hematuria requiring urology to scope in a hematuria catheter on 2/27 with brief period on CBI. He was discharged with the foley, which was removed in urology clinic on 3/4. It is unclear if a foley was  replaced at his SNF, but he represented to the ED on 3/12 in clot retention. Urology placed a 22 Fr 3-way hematuria catheter, manually irrigated bladder, and started CBI, but a persistent large bladder clot was present on imaging.  Thus, he presents today for cystourethroscopy and clot evacuation +/- fulguration today. We re-reviewed his CT A/P w/o contrast that was obtained during hospitalization, and while it isn't the optimal study to complete hematuria evaluation of upper tracts, we believe it is adequate and will thus defer bilateral retrograde pyelograms today. Informed consent was obtained from the patient's family after a discussion of procedural risks and benefits.   Description of procedure: The patient was correctly identified in the pre-operative area and taken to the operating room. After a pre-induction timeout, general anesthesia was induced. He had already received appropriate peri-procedural antibiotics of ceftriaxone. SCDs were in place for DVT ppx. The patient was positioned in the dorsal lithotomy position. I did an exam under anesthesia.[name] He was prepped and draped in the typical sterile fashion. A surgical timeout was performed confirming the correct patient and procedure.  We began by advancing a 22 Fr rigid cystoscope per the urethra with copious lubrication and normal saline irrigation fluid. We performed pan cystourethroscopy with the findings noted above. The medium-sized bladder clots were irrigated out. We then switched to a 26 Fr resectoscope with bipolar loop working element, with which we fulgurated the oozing bladder neck from 5-7 o'clock and oozing right lateral/anterior prostatic urethra, being careful to avoid injury to the ureteral orifices and verumontanem. After fulguration, hemostasis was excellent, which was confirmed with irrigation off. We then removed the resectoscope, leaving the bladder full. We inserted a 20 Fr straight-tipped foley catheter with 10cc sterile  water in the balloon. Draining urine was clear.  The foley was attached to a drainage bag.  The patient was awoken from anesthesia having tolerated the procedure well.  Recommendations: - Continue foley catheter to drainage on discharge given bladder stretch injury. We will arrange outpatient follow up in our clinic next week for foley removal/TOV - Prescribed flomax and finasteride on discharge. Please ensure patient is aware of these medications on discharge - Patient is ok to discharge from a urology standpoint. We will sign off at this time. Please page urology with any questions/concerns

## 2021-02-01 NOTE — Progress Notes (Signed)
Received report for surgery from 6 north floor nurse, did not catch her name.

## 2021-02-01 NOTE — Progress Notes (Signed)
Patient left facility to [facility] for his scheduled procedure via [facility].

## 2021-02-01 NOTE — Progress Notes (Signed)
Palliative:  HPI: 80 y.o. male  with past medical history of dementia, CHF EF 20-25%, hypertension, diabetes, recent COVID infection and PE on Eliquis, recent urinary retention admitted on 01/29/2021 with hematuria and pelvis pain with clots and requiring continuous bladder irrigation. Urology planning cystoscopy 02/01/21.   I met today with [name] who is still sleepy from procedure earlier and confused. No family at bedside. He denies pain or discomfort.   I called and spoke with son, Gary Mcneil. I provided update for Gary Mcneil that his father was resting comfortably. Plan per urology to leave foley catheter in for now. Main conversation is to continue to pursue rehab and Gary Mcneil is trying to obtain assistance from [place] but has not heard back from [place] yet. I did mention to him that many people who do not have other sources to cover long term care look into long term care Medicaid. I encouraged Gary Mcneil to look into Medicaid and see if this is an option if the [place] will not cover placement.   I also discussed further about MOST form I left for Gary Mcneil. Gary Mcneil reports that he filled out this form and left at nursing station. He has no questions or concerns regarding the decisions on form. Will review and make copies to be uploaded into electronic chart tomorrow.   All questions/concerns addressed. Emotional support provided.   Exam: Alert, lethargic. No distress. Denies pain. Breathing regular, unlabored. Abd soft, flat. Foley cath in place.   Plan: - MOST completed by family and will review tomorrow.  - Plans for SNF rehab.  - TOC provide information to family regarding long term care Medicaid.   25 min  Vinie Sill, NP Palliative Medicine Team Pager [phone] (Please see [email or web address] for schedule) Team Phone [phone]    Greater than 50%  of this time was spent counseling and coordinating care related to the above assessment and plan

## 2021-02-01 NOTE — Progress Notes (Addendum)
Subjective: Clot evacuation rescheduled to today. NAEON. AFHDS. Hgb stable. Cr normal.   Urine currently light yellow on slow drip CBI this morning. Patient adamently refused foley irrigation by urology this morning. NPO since MN for clot evacuation today under GA  Objective: Vital signs in last 24 hours: Temp:  [97.5 F (36.4 C)-98.4 F (36.9 C)] 98.2 F (36.8 C) (03/15 0331) Pulse Rate:  [83-106] 96 (03/15 0331) Resp:  [17-20] 18 (03/15 0331) BP: (101-106)/(60-70) 106/60 (03/15 0331) SpO2:  [99 %-100 %] 99 % (03/15 0331)  Intake/Output from previous day: 03/14 0701 - 03/15 0700 In: 2146.2 [P.O.:600; I.V.:366.2; IV Piggyback:80] Out: 8025 [Urine:8025] Intake/Output this shift: No intake/output data recorded.  Physical Exam:  General: Alert and oriented CV: Regular rate Lungs: NWOB on RA GU: 22 Fr 3-way hematuria catheter in place draining clear urine on slow drip CBI Ext: Warm and well-perfused   Lab Results: Recent Labs    01/30/21 0329 01/31/21 0210 02/01/21 0315  HGB 11.3* 10.1* 10.3*  HCT 32.0* 27.7* 30.3*   BMET Recent Labs    01/30/21 0329 01/31/21 0210  NA 139 138  K 4.7 4.1  CL 106 106  CO2 26 24  GLUCOSE 177* 125*  BUN 34* 23  CREATININE 1.09 0.87  CALCIUM 8.8* 8.7*     Studies/Results: No results found.  Assessment/Plan: Gary Mcneil is a 80 y.o. 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 He was incidentally found to have urinary retention and AKI during hospitalization c/b foley trauma and hematuria requiring urology to scope in a hematuria catheter on 2/27 with brief period on CBI. He was discharged with the foley, which was removed in urology clinic on 3/4. It is unclear if a foley was replaced at  his SNF, but he represented to the ED on 3/12 in clot retention with palpable bladder on exam (931cc in bladder on ultrasound) and large bladder clot on imaging. Urology placed a 22 Fr 3-way hematuria catheter, manually irrigated bladder, and started CBI.  Patient has a persistent large bladder clot on imaging that we are unable to remove via bedside irrigation. He has remained AFHDS with stable Hgb, so there is lower concern for active bleeding, rather he likely has a well-formed clot that may have been there for some time. Given his large clot, we recommend proceeding with cystourethroscopy and clot evacuation +/- fulguration today. We will also perform bilateral retrograde pyelograms to complete hematuria workup.   - Please keep patient NPO at MN for above procedure today  - Continue CBI. Titrate CBI to achieve pink urine. If foley catheter stops draining, please stop CBI, disconnect foley from tubing, and manually irrigate the foley with a toomey syringe to relieve any possible obstructing clots. If unable to flush foley catheter, please page urology - Continue foley catheter to drainage. Patient will need foley catheter for at least 10-14 days for bladder rest given bladder stretch injury. We will arrange appropriate outpatient follow up pending his clinical course - Continue home flomax QD. Start finasteride QD for likely prostatic bleeding - On ceftriaxone for empiric treatment of possible UTI. Urine culture doesn't appear to have been sent to micro from ED - Recommend platelet transfusion for thrombocytopenia </= 50 - We will continue to follow along     LOS:  3 days   Margette Fast 02/01/2021, 7:16 AM  I have seen and examined the patient and agree with the above assessment and plan.  Urine remains clear. Irrigates without difficulty. Planning for OR tomorrow for cysto, b/l RPG, clot evacuation, possible bladder biopsy, possible TURBT. NPO after midnight.  Gary R. Gay MD [facility]  Pager: [phone]

## 2021-02-01 NOTE — Interval H&P Note (Signed)
History and Physical Interval Note:  02/01/2021 8:57 AM  Gary Mcneil  has presented today for surgery, with the diagnosis of GROSS HEMATURIA, BENIGN PROSTATIC HYPERPLASIA.  The various methods of treatment have been discussed with the patient and family. After consideration of risks, benefits and other options for treatment, the patient has consented to  Procedure(s): CYSTOSCOPY WITH CLOT EVACUATION (N/A) POSSIBLE TRANSURETHRAL RESECTION OF THE PROSTATE (TURP) (N/A) POSSIBLE TRANSURETHRAL RESECTION OF BLADDER TUMOR (TURBT) (N/A) BILATERAL RETROGRADE PYELOGRAM (Bilateral) as a surgical intervention.  The patient's history has been reviewed, patient examined, no change in status, stable for surgery.  I have reviewed the patient's chart and labs.  Questions were answered to the patient's satisfaction.     Margette Fast

## 2021-02-01 NOTE — Anesthesia Procedure Notes (Signed)
Procedure Name: Intubation Date/Time: 02/01/2021 10:41 AM Performed by: Vanessa [place], CRNA Pre-anesthesia Checklist: Patient identified, Emergency Drugs available, Suction available and Patient being monitored Patient Re-evaluated:Patient Re-evaluated prior to induction Oxygen Delivery Method: Circle system utilized Preoxygenation: Pre-oxygenation with 100% oxygen Induction Type: IV induction Ventilation: Mask ventilation without difficulty Laryngoscope Size: 2 and Miller Grade View: Grade I Tube type: Oral Tube size: 7.5 mm Number of attempts: 1 Airway Equipment and Method: Stylet Placement Confirmation: ETT inserted through vocal cords under direct vision,  positive ETCO2 and breath sounds checked- equal and bilateral Secured at: 21 cm Tube secured with: Tape Dental Injury: Teeth and Oropharynx as per pre-operative assessment

## 2021-02-01 NOTE — Evaluation (Signed)
Occupational Therapy Evaluation Patient Details Name: Gary Mcneil MRN: [id number] DOB: 1941-09-14 Today's Date: 02/01/2021    History of Present Illness Gary Mcneil is a 80 y.o. male admitted on 3/12 with blood in urine and pelvic pain PMH: dementia, HTN, DM2, HFrEF with EF 30-35% as of earlier this month, heavy EtOH use / abuse who was admitted last month with COVID-19 and PE.   Clinical Impression   Pt in bed upon arrival. Suspect pt is functioning near baseline. Pt approriate to return to SNF as pt is min/min guard assist and will defer any further OT intervention to SNF. Pt with poor cognition and is unable to manage self at home. Pt stated, "you don't have to check on me, I'm straight". Pt unaware of situation, date, or place. No further acute OT services are indicated at this time. OT will sign off      Follow Up Recommendations  SNF;Supervision/Assistance - 24 hour    Equipment Recommendations  None recommended by OT    Recommendations for Other Services       Precautions / Restrictions Precautions Precautions: Fall Precaution Comments: catheter with 2 foleys and irrigation Restrictions Weight Bearing Restrictions: No      Mobility Bed Mobility Overal bed mobility: Needs Assistance Bed Mobility: Supine to Sit;Sit to Supine     Supine to sit: Min assist Sit to supine: Min assist   General bed mobility comments: min A for LE management back into bed due to R hip/groin pain    Transfers Overall transfer level: Needs assistance Equipment used: Rolling walker (2 wheeled) Transfers: Sit to/from [employer] Sit to Stand: Min assist Stand pivot transfers: Min assist       General transfer comment: minA to power up and steady during transition of hands from arm rests to RW, line management also provided    Balance Overall balance assessment: Needs assistance Sitting-balance support: No upper extremity supported;Feet supported Sitting  balance-Leahy Scale: Fair     Standing balance support: Bilateral upper extremity supported Standing balance-Leahy Scale: Poor                             ADL either performed or assessed with clinical judgement   ADL Overall ADL's : Needs assistance/impaired Eating/Feeding: Set up;Independent;Sitting   Grooming: Therapist, nutritional;Wash/dry hands;Sitting;Set up;Supervision/safety   Upper Body Bathing: Set up;Supervision/ safety;Sitting   Lower Body Bathing: Maximal assistance   Upper Body Dressing : Set up;Supervision/safety;Sitting   Lower Body Dressing: Maximal assistance   Toilet Transfer: Minimal assistance;Stand-pivot;BSC;Ambulation;RW;Cueing for safety;Cueing for sequencing   Toileting- Clothing Manipulation and Hygiene: Maximal assistance       Functional mobility during ADLs: Minimal assistance;Rolling walker;Cueing for safety;Cueing for sequencing       Vision Patient Visual Report: No change from baseline       Perception     Praxis      Pertinent Vitals/Pain Pain Assessment: No/denies pain     Hand Dominance Right   Extremity/Trunk Assessment Upper Extremity Assessment Upper Extremity Assessment: Generalized weakness   Lower Extremity Assessment Lower Extremity Assessment: Defer to PT evaluation   Cervical / Trunk Assessment Cervical / Trunk Assessment: Normal   Communication Communication Communication: No difficulties   Cognition Arousal/Alertness: Awake/alert Behavior During Therapy: WFL for tasks assessed/performed Overall Cognitive Status: History of cognitive impairments - at baseline  General Comments: pt with dementia at baseline, pt only oriented to self, pt with decreased safety awareness, unaware of deficits   General Comments       Exercises     Shoulder Instructions      Home Living Family/patient expects to be discharged to:: Skilled nursing facility (pt has been  living at [facility] since last admission)                                 Additional Comments: pt has resided in SNF since last hospital admission, per previous admission pt was home alone. Pt poor historian as pt states he lives at [facility] and has been for 28years      Prior Functioning/Environment Level of Independence: Needs assistance  Gait / Transfers Assistance Needed: pt reports using RW at SNF ADL's / Homemaking Assistance Needed: suspect that pt requires extensive assist with ADLs at baseline   Comments: pt poor historian, unsure of accuracy of PLOF info pt provided        OT Problem List: Decreased strength;Impaired balance (sitting and/or standing);Decreased cognition;Decreased coordination;Decreased activity tolerance;Decreased knowledge of use of DME or AE;Decreased safety awareness      OT Treatment/Interventions:      OT Goals(Current goals can be found in the care plan section) Acute Rehab OT Goals Patient Stated Goal: none stated OT Goal Formulation: With patient  OT Frequency:     Barriers to D/C:            Co-evaluation              AM-PAC OT "6 Clicks" Daily Activity     Outcome Measure Help from another person eating meals?: None Help from another person taking care of personal grooming?: A Little Help from another person toileting, which includes using toliet, bedpan, or urinal?: A Lot Help from another person bathing (including washing, rinsing, drying)?: A Lot Help from another person to put on and taking off regular upper body clothing?: A Little Help from another person to put on and taking off regular lower body clothing?: A Lot 6 Click Score: 16   End of Session Equipment Utilized During Treatment: Gait belt;Rolling walker;Other (comment) (BSC)  Activity Tolerance: Patient limited by fatigue Patient left: in bed;with call bell/phone within reach;with bed alarm set  OT Visit Diagnosis: Unsteadiness on feet (R26.81);Other  abnormalities of gait and mobility (R26.89);Muscle weakness (generalized) (M62.81);Other symptoms and signs involving cognitive function                Time: 8828-0034 OT Time Calculation (min): 19 min Charges:  OT General Charges $OT Visit: 1 Visit OT Evaluation $OT Eval Moderate Complexity: 1 Mod   Galen Manila 02/01/2021, 5:10 PM

## 2021-02-01 NOTE — Anesthesia Postprocedure Evaluation (Signed)
Anesthesia Post Note  Patient: Gary Mcneil  Procedure(s) Performed: CYSTOSCOPY WITH FULGERATION (N/A )     Patient location during evaluation: PACU Anesthesia Type: General Level of consciousness: sedated and patient cooperative Pain management: pain level controlled (pain improving) Vital Signs Assessment: post-procedure vital signs reviewed and stable Respiratory status: spontaneous breathing, nonlabored ventilation, respiratory function stable and patient connected to nasal cannula oxygen Cardiovascular status: blood pressure returned to baseline and stable Postop Assessment: no apparent nausea or vomiting and adequate PO intake Anesthetic complications: no   No complications documented.  Last Vitals:  Vitals:   02/01/21 0331 02/01/21 0905  BP: 106/60 117/74  Pulse: 96 88  Resp: 18 18  Temp: 36.8 C 36.6 C  SpO2: 99% 95%    Last Pain:  Vitals:   02/01/21 0905  TempSrc: Oral  PainSc:                  JACKSON,E. CARSWELL

## 2021-02-02 ENCOUNTER — Encounter (HOSPITAL_COMMUNITY): Payer: Self-pay | Admitting: Urology

## 2021-02-02 DIAGNOSIS — I5020 Unspecified systolic (congestive) heart failure: Secondary | ICD-10-CM | POA: Diagnosis not present

## 2021-02-02 DIAGNOSIS — F0150 Vascular dementia without behavioral disturbance: Secondary | ICD-10-CM | POA: Diagnosis not present

## 2021-02-02 DIAGNOSIS — E1140 Type 2 diabetes mellitus with diabetic neuropathy, unspecified: Secondary | ICD-10-CM | POA: Diagnosis not present

## 2021-02-02 DIAGNOSIS — R319 Hematuria, unspecified: Secondary | ICD-10-CM | POA: Diagnosis not present

## 2021-02-02 LAB — CBC
HCT: 28.6 % — ABNORMAL LOW (ref 39.0–52.0)
Hemoglobin: 9.8 g/dL — ABNORMAL LOW (ref 13.0–17.0)
MCH: 29 pg (ref 26.0–34.0)
MCHC: 34.3 g/dL (ref 30.0–36.0)
MCV: 84.6 fL (ref 80.0–100.0)
Platelets: 90 10*3/uL — ABNORMAL LOW (ref 150–400)
RBC: 3.38 MIL/uL — ABNORMAL LOW (ref 4.22–5.81)
RDW: 15.5 % (ref 11.5–15.5)
WBC: 10.2 10*3/uL (ref 4.0–10.5)
nRBC: 0 % (ref 0.0–0.2)

## 2021-02-02 LAB — GLUCOSE, CAPILLARY
Glucose-Capillary: 168 mg/dL — ABNORMAL HIGH (ref 70–99)
Glucose-Capillary: 111 mg/dL — ABNORMAL HIGH (ref 70–99)

## 2021-02-02 LAB — SARS CORONAVIRUS 2 (TAT 6-24 HRS): SARS Coronavirus 2: NEGATIVE

## 2021-02-02 MED ORDER — APIXABAN 5 MG PO TABS
5.0000 mg | ORAL_TABLET | Freq: Two times a day (BID) | ORAL | 0 refills | Status: AC
Start: 2021-02-02 — End: ?

## 2021-02-02 MED ORDER — POTASSIUM CHLORIDE CRYS ER 20 MEQ PO TBCR: 40.0000 meq | EXTENDED_RELEASE_TABLET | Freq: Every day | ORAL | 3 refills | Status: AC

## 2021-02-02 MED ORDER — INSULIN ASPART 100 UNIT/ML ~~LOC~~ SOLN: 0.0000 [IU] | Freq: Three times a day (TID) | SUBCUTANEOUS | 11 refills | Status: AC

## 2021-02-02 NOTE — Progress Notes (Signed)
POD1 Subjective: NAEON. AFHDS. No new labs today. Good UOP overnight. Foley draining yellow urine without clots this morning     Objective: Vital signs in last 24 hours: Temp:  [97.5 F (36.4 C)-98.3 F (36.8 C)] 98.3 F (36.8 C) (03/16 0345) Pulse Rate:  [77-96] 88 (03/16 0345) Resp:  [16-20] 18 (03/15 2123) BP: (107-122)/(66-77) 111/67 (03/16 0345) SpO2:  [91 %-97 %] 95 % (03/16 0345)  Intake/Output from previous day: 03/15 0701 - 03/16 0700 In: 1180.7 [P.O.:670; I.V.:410.7; IV Piggyback:100] Out: 615 [Urine:615] Intake/Output this shift: No intake/output data recorded.  Physical Exam:  General: Laying in bed in NAD CV: Regular rate Lungs: NWOB on RA GU: 20 Fr 2-way draining yellow urine without clots Ext: Warm and well-perfused   Lab Results: Recent Labs    01/31/21 0210 02/01/21 0315  HGB 10.1* 10.3*  HCT 27.7* 30.3*   BMET Recent Labs    01/31/21 0210  NA 138  K 4.1  CL 106  CO2 24  GLUCOSE 125*  BUN 23  CREATININE 0.87  CALCIUM 8.7*     Studies/Results: No results found.  Assessment/Plan: Gary Mcneil is a 80 y.o. 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 He was incidentally found to have urinary retention and AKI during hospitalization c/b foley trauma and hematuria requiring urology to scope in a hematuria catheter on 2/27 with brief period on CBI. He was discharged with the foley, which was removed in urology clinic on 3/4. It is unclear if a foley was replaced at his SNF, but he represented to the ED on 3/12 in clot retention. He was initially managed with a hematuria catheter + CBI, but there was persistent large bladder clot imaging c/f well-formed clot.  He is now POD1 s/p cysto, clot evacuation,  and fulguration of oozing bladder neck and right lateral/anterior prostatic urethra. 20 Fr 2-way foley in place draining yellow urine without clots. AFHDS.    - Continue foley catheter to drainage on discharge given bladder stretch injury. We will arrange outpatient follow up in our clinic next week for foley removal/TOV. We will keep his appointment with Dr. Mena Goes on 5/4 for re-evaluation but cancel his scheduled renal ultrasound/cysto as patient has completed his hematuria workup with CT + cysto during current hospitalization - Prescribed flomax and finasteride on discharge. Please ensure patient is aware of these medications on discharge - Patient is ok to discharge from a urology standpoint. We will sign off at this time. Please page urology with any questions/concerns      LOS: 4 days   Margette Fast 02/02/2021, 7:24 AM

## 2021-02-02 NOTE — Progress Notes (Signed)
PROGRESS NOTE    PAL SHELL   JQB:[id number]  DOB: 12/13/1940  PCP: Harvest [place], MD    DOA: 01/29/2021 LOS: 4   Brief Narrative   80 year old male with PMH of dementia, systolic CHF, DM-2, urinary retention, recent hospitalization from 2/4-2/10 for respiratory failure due to COVID-19 PNA and PE, and 2/22-3/1 for respiratory failure due to CHF exacerbation and possible pneumonia returning from SNF with the above chief complaints, and admitted for hematuria.  Bedside US showed about a liter of urine and large volume blood clot in bladder.  CT renal confirmed large blood clots in bladder and enlarged prostate.  Evaluated by urology and started on CBI.  Urine culture obtained.  Started on ceftriaxone.    Urology consulted and patient underwent cystoscopy with TURBT on 3/15.  He is stable for return to SNF.  TOC working on it.   Assessment & Plan   Principal Problem:   Hematuria Active Problems:   Type 2 diabetes mellitus with diabetic neuropathy (HCC)   Dementia without behavioral disturbance (HCC)   Essential hypertension   Leukocytosis   Thrombocytopenia (HCC)   HFrEF (heart failure with reduced ejection fraction) (HCC)   Pelvic pain   Hematuria /chronic urinary retention /prostate enlargement -present admission.  Bedside ultrasound showed 9 3 0 cc retained urine a large blood clot.  CT confirmed large clots and also showed prostate management. --Urology following, cleared for d/c after cysto today --Holding Eliquis, resume when safe from bleeding standpoint and Hbg stable --Continue empiric Rocephin pending urine culture  Chronic systolic CHF -echo in February showed EF 20 to 25%.  Patient appears euvolemic and overall compensated.  Takes Lasix 40 mg daily at home. --Off IV fluids --Holding home Lasix --Continue losartan, metoprolol --Monitor renal function, electrolytes and volume status closely  Recent pulmonary embolism -diagnosed on 2/4.  PE with segmental  and subsegmental in the bilateral lower lobes.  No central PE was seen at that time. --Eliquis on hold due to hematuria  Non-insulin-dependent type 2 diabetes -takes Tradjenta and Metformin at home. --Continue sliding scale NovoLog and Tradjenta  Essential hypertension -chronic, stable.  Continue metoprolol and losartan.  Dementia without behavioral disturbance -continue Namenda.   --Delirium precautions:     -Lights and TV off, minimize interruptions at night    -Blinds open and lights on during day    -Glasses/hearing aid with patient    -Frequent reorientation    -PT/OT when able    -Avoid sedation medications/Beers list medications  Leukocytosis -improved.  Monitor CBC  History of COVID-19 infection -at outside window for isolation, no precautions needed  Goals of care: Patient has significant cardiac and pulmonary comorbidities in addition to dementia.  Prognosis likely to be less than 6 months.  He is full CODE STATUS.  Prior attending discussed with patient's son, Jaquarius, who says he is HCPOA, initially wanted pt to remain full code but has since stated he agrees with DNR. --Palliative care consulted, appreciate assistance   DVT prophylaxis: Place and maintain sequential compression device Start: 01/30/21 1311   Diet:  Diet Orders (From admission, onward)    Start     Ordered   02/01/21 1236  Diet heart healthy/carb modified Room service appropriate? Yes with Assist; Fluid consistency: Thin  Diet effective now       Question Answer Comment  Diet-HS Snack? Nothing   Room service appropriate? Yes with Assist   Fluid consistency: Thin      02/01/21 1236  Code Status: DNR    Subjective 02/02/21    Patient seen after urology procedure at [facility] today.  Reports feeling well.  No acute complaints other than some mild right-sided groin / suprapubic pain.   Disposition Plan & Communication   Status is: Inpatient  Remains inpatient appropriate because: unsafe  d/c.  Discharge to SNF pending.   Dispo: The patient is from: SNF              Anticipated d/c is to: SNF              Patient currently is medically ready for d/c   Difficult to place patient No    Family Communication: None at bedside, will attempt to call   Consults, Procedures, Significant Events   Consultants:   Urology  Palliative care  Procedures:   None  Antimicrobials:  Anti-infectives (From admission, onward)   Start     Dose/Rate Route Frequency Ordered Stop   01/30/21 1000  cefTRIAXone (ROCEPHIN) 1 g in sodium chloride 0.9 % 100 mL IVPB        1 g 200 mL/hr over 30 Minutes Intravenous Every 24 hours 01/30/21 0745 02/04/21 0959        Micro    Objective   Vitals:   02/01/21 1100 02/01/21 1115 02/01/21 1218 02/01/21 2123  BP: 115/66 114/67 108/67 107/69  Pulse: 82 77 80 79  Resp: 17 20 16 18   Temp:   98 F (36.7 C) 98.1 F (36.7 C)  TempSrc:   Axillary   SpO2: 93% 97% 97%     Intake/Output Summary (Last 24 hours) at 02/02/2021 0032 Last data filed at 02/01/2021 2123 Gross per 24 hour  Intake 2308.3 ml  Output 1425 ml  Net 883.3 ml   There were no vitals filed for this visit.  Physical Exam:  General exam: awake, alert, no acute distress Respiratory system: CTAB, no wheezes, rales or rhonchi, normal respiratory effort. Cardiovascular system: normal S1/S2, RRR, no pedal edema.   Gastrointestinal system: soft, NT, ND, +bowel sounds. Central nervous system: no gross focal neurologic deficits, normal speech Genitourinary: Foley in place, urine light pink in bag  Labs   Data Reviewed: I have personally reviewed following labs and imaging studies  CBC: Recent Labs  Lab 01/29/21 1850 01/30/21 0329 01/31/21 0210 02/01/21 0315  WBC 19.7* 13.0* 10.2 8.7  NEUTROABS 14.5*  --  6.8  --   HGB 12.5* 11.3* 10.1* 10.3*  HCT 38.1* 32.0* 27.7* 30.3*  MCV 88.4 83.6 82.0 84.9  PLT 58* 58* 65* 68*   Basic Metabolic Panel: Recent Labs  Lab  01/29/21 1850 01/30/21 0329 01/31/21 0210  NA 134* 139 138  K 4.8 4.7 4.1  CL 103 106 106  CO2 21* 26 24  GLUCOSE 189* 177* 125*  BUN 33* 34* 23  CREATININE 1.25* 1.09 0.87  CALCIUM 8.4* 8.8* 8.7*  MG  --   --  1.6*  PHOS  --   --  3.2   GFR: CrCl cannot be calculated (Unknown ideal weight.). Liver Function Tests: Recent Labs  Lab 01/31/21 0210  ALBUMIN 2.6*   No results for input(s): LIPASE, AMYLASE in the last 168 hours. No results for input(s): AMMONIA in the last 168 hours. Coagulation Profile: Recent Labs  Lab 01/29/21 2322  INR 1.3*   Cardiac Enzymes: No results for input(s): CKTOTAL, CKMB, CKMBINDEX, TROPONINI in the last 168 hours. BNP (last 3 results) No results for input(s): PROBNP in the last 8760  hours. HbA1C: Recent Labs    01/31/21 0210  HGBA1C 8.0*   CBG: Recent Labs  Lab 02/01/21 0838 02/01/21 1143 02/01/21 1225 02/01/21 1816 02/01/21 2121  GLUCAP 94 108* 128* 197* 164*   Lipid Profile: No results for input(s): CHOL, HDL, LDLCALC, TRIG, CHOLHDL, LDLDIRECT in the last 72 hours. Thyroid Function Tests: No results for input(s): TSH, T4TOTAL, FREET4, T3FREE, THYROIDAB in the last 72 hours. Anemia Panel: No results for input(s): VITAMINB12, FOLATE, FERRITIN, TIBC, IRON, RETICCTPCT in the last 72 hours. Sepsis Labs: No results for input(s): PROCALCITON, LATICACIDVEN in the last 168 hours.  No results found for this or any previous visit (from the past 240 hour(s)).    Imaging Studies   No results found.   Medications   Scheduled Meds: . atorvastatin  40 mg Oral QHS  . Chlorhexidine Gluconate Cloth  6 each Topical Daily  . donepezil  10 mg Oral Daily  . finasteride  5 mg Oral Daily  . insulin aspart  0-5 Units Subcutaneous QHS  . insulin aspart  0-9 Units Subcutaneous TID WC  . linagliptin  5 mg Oral Daily  . losartan  50 mg Oral Daily  . memantine  28 mg Oral Daily  . metoprolol succinate  50 mg Oral Daily  . tamsulosin  0.4  mg Oral QPC supper   Continuous Infusions: . cefTRIAXone (ROCEPHIN)  IV 1 g (01/31/21 0941)       LOS: 4 days    Time spent: 25 minutes with greater than 50% spent at bedside and coronation of care    Pennie Banter, DO [facility]  02/02/2021, 12:32 AM      If 7PM-7AM, please contact night-coverage. How to contact the [facility] Attending or Consulting provider 7A - 7P or covering provider during after hours 7P -7A, for this patient?    1. Check the care team in [facility] and look for a) attending/consulting [facility] provider listed and b) the [facility] team listed 2. Log into [email or web address] and use [facility]'s universal password to access. If you do not have the password, please contact the hospital operator. 3. Locate the [facility] provider you are looking for under [facility] and page to a number that you can be directly reached. 4. If you still have difficulty reaching the provider, please page the [facility] (Director on Call) for the Hospitalists listed on amion for assistance.

## 2021-02-02 NOTE — Progress Notes (Signed)
Attempted to call [facility] for report but no answer.

## 2021-02-02 NOTE — Progress Notes (Signed)
Attempted to call SNF facility [phone] , left a message to give me a call back.

## 2021-02-02 NOTE — Progress Notes (Signed)
Patient discharged to [facility] via [place], reported to nurse Morrie Sheldon.

## 2021-02-02 NOTE — Progress Notes (Signed)
Palliative:  Patient resting and no family at bedside. Plans for d/c to SNF rehab today. MOST form and DNR completed and copies made to scan into electronic record. MOST: DNR, comfort measures with no rehospitalization, antibiotics and IV fluids as indicated, NO feeding tube.   No charge  Yong Channel, NP Palliative Medicine Team Pager [phone] (Please see [email or web address] for schedule) Team Phone [phone]

## 2021-02-02 NOTE — Progress Notes (Signed)
Patient had an unwitnessed fall, heard him calling out in his room by the nurses station. Patient claimed that he was trying to put on his pants but slowly slid off the chair. Noted some bleeding from his penis as his foley catheter came out with the balloon still inflated. We took him back to his bed after assessing him for injuries, with the help of PT who was about to work with him. No noted other injuries at this time, MD made aware, son Yordan Martindale. was also notified. The Urologist is also aware and replaced patient's foley catheter as ordered. Foley catheter is putting out dark pink output. Will continue to monitor for any delayed injuries.

## 2021-02-02 NOTE — Progress Notes (Addendum)
Physical Therapy Treatment Patient Details Name: Gary Mcneil MRN: [id number] DOB: 04/20/1941 Today's Date: 02/02/2021    History of Present Illness Gary Mcneil is a 80 y.o. male admitted on 3/12 with blood in urine and pelvic pain. Pt with fall and pulling catheter on 3/16 in room by himself. PMH: dementia, HTN, DM2, HFrEF with EF 30-35% as of earlier this month, heavy EtOH use / abuse who was admitted last month with COVID-19 and PE.    PT Comments    PT arrived to pt room with RN x2, and NT with pt, as pt recently assisted back into recliner s/p fall. PT requested to assist with back to bed. Pt requiring min +2 for bed mobility, transfers, and short distance gait, pt very limited by impaired command following and poor safety awareness with use of RW. Pt shaken and tearful during session, but mobilizes well despite this. PT to continue to follow acutely.   Pt's bed alarm set at end of session, but not alarming to nurse's desk when it goes off. RN notified, and is addressing this.    Follow Up Recommendations  SNF;Supervision/Assistance - 24 hour     Equipment Recommendations   (has DME at [facility])    Recommendations for Other Services       Precautions / Restrictions Precautions Precautions: Fall Restrictions Weight Bearing Restrictions: No    Mobility  Bed Mobility Overal bed mobility: Needs Assistance Bed Mobility: Sit to Supine;Rolling Rolling: Supervision     Sit to supine: Min assist   General bed mobility comments: min assist for LE lifting into bed, repositioning. Rolling bilaterally with supervision assist for safety, cuing pt to not roll too far.    Transfers Overall transfer level: Needs assistance Equipment used: Rolling walker (2 wheeled) Transfers: Sit to/from Stand Sit to Stand: Min assist;+2 physical assistance         General transfer comment: Min assist +2 for power up, rise, and steadying. Increased time and effort, pt standing from edge of  recliner.  Ambulation/Gait Ambulation/Gait assistance: Min assist Gait Distance (Feet): 5 Feet Assistive device: Rolling walker (2 wheeled) Gait Pattern/deviations: Step-through pattern;Decreased stride length;Trunk flexed;Drifts right/left Gait velocity: decr   General Gait Details: min assist +2 to steady, guide RW, cues for placement in RW and holding onto RW as pt letting go of RW multiple times with L hand. Additional assist for slow eccentric descent to sitting EOB.   Stairs             Wheelchair Mobility    Modified Rankin (Stroke Patients Only)       Balance Overall balance assessment: Needs assistance Sitting-balance support: No upper extremity supported;Feet supported Sitting balance-Leahy Scale: Fair     Standing balance support: Bilateral upper extremity supported Standing balance-Leahy Scale: Poor Standing balance comment: reliant on external support                            Cognition Arousal/Alertness: Awake/alert Behavior During Therapy: WFL for tasks assessed/performed Overall Cognitive Status: History of cognitive impairments - at baseline                                 General Comments: history of dementia, pt with inconsistent command following especially during mobility. Pt shaken up from recent fall, tearful and repeating over and over "I'm sorry"      Exercises  General Comments General comments (skin integrity, edema, etc.): PT assisted pt in washing up, clotting blood on legs, scrotum, and buttocks from pulling foley catheter out himself      Pertinent Vitals/Pain Pain Assessment: Faces Faces Pain Scale: Hurts little more Pain Location: generalized Pain Descriptors / Indicators: Sore;Discomfort Pain Intervention(s): Limited activity within patient's tolerance;Monitored during session;Repositioned    Home Living                      Prior Function            PT Goals (current goals can  now be found in the care plan section) Acute Rehab PT Goals PT Goal Formulation: With patient Time For Goal Achievement: 01/27/21 Potential to Achieve Goals: Good Progress towards PT goals: Progressing toward goals    Frequency    Min 2X/week      PT Plan Current plan remains appropriate    Co-evaluation              AM-PAC PT "6 Clicks" Mobility   Outcome Measure  Help needed turning from your back to your side while in a flat bed without using bedrails?: A Little Help needed moving from lying on your back to sitting on the side of a flat bed without using bedrails?: A Little Help needed moving to and from a bed to a chair (including a wheelchair)?: A Little Help needed standing up from a chair using your arms (e.g., wheelchair or bedside chair)?: A Little Help needed to walk in hospital room?: A Little Help needed climbing 3-5 steps with a railing? : A Lot 6 Click Score: 17    End of Session   Activity Tolerance: Patient limited by fatigue;Other (comment) (anxiety s/p fall) Patient left: in bed;with call bell/phone within reach;with bed alarm set Nurse Communication: Mobility status PT Visit Diagnosis: Unsteadiness on feet (R26.81);Muscle weakness (generalized) (M62.81);Difficulty in walking, not elsewhere classified (R26.2)     Time: 5093-2671 PT Time Calculation (min) (ACUTE ONLY): 10 min  Charges:  $Therapeutic Activity: 8-22 mins                     Marye Round, PT Acute Rehabilitation Services Pager [phone]  Office [phone]    Truddie Coco 02/02/2021, 12:27 PM

## 2021-02-02 NOTE — Discharge Summary (Signed)
Discharge Summary  Gary Mcneil WUJ:[id number] DOB: 02/13/1941  PCP: Harvest ForestBakare, Mobolaji B, MD  Admit date: 01/29/2021 Discharge date: 02/02/2021  Time spent: 45mins, more than 50% time spent on coordination of care.   Recommendations for Outpatient Follow-up:  1. F/u with SNF MD for hospital discharge follow up, repeat cbc/bmp at follow up 2. F/u with urology foley clinic next week, 3. F/u with urology Dr Mena GoesEskridge on 5/4 4. Palliative care continue to follow patient at skilled nursing facility  Code Status: DNR  Discharge Diagnoses:  Active Hospital Problems   Diagnosis Date Noted  . Hematuria 01/29/2021  . Leukocytosis 01/29/2021  . Thrombocytopenia (HCC) 01/29/2021  . HFrEF (heart failure with reduced ejection fraction) (HCC) 01/29/2021  . Pelvic pain 01/29/2021  . Essential hypertension   . Dementia without behavioral disturbance (HCC) 09/02/2014  . Type 2 diabetes mellitus with diabetic neuropathy (HCC) 05/20/2014    Resolved Hospital Problems  No resolved problems to display.    Discharge Condition: stable  Diet recommendation: heart healthy/carb modified  There were no vitals filed for this visit.  History of present illness: (Per admitting MD Dr. Rachael Darbyhotiner) Patient coming from: SNF  Chief Complaint: blood in urine, pelvic pain  HPI: Gary Mcneil is a 80 y.o. male with medical history significant for dementia, HTN, DM2, HFrEF with EF 30-35% as of earlier this month, heavy EtOH use / abuse who was admitted last month with COVID-19 and PE. Is on Eliquis. He was initially admitted in early Feb and then readmitted on 01/13/21 for respiratory failure and during that admission developed urinary retention and required foley to be placed by urology. Foley was removed during outpatient follow up with urology but was subsequently replaced in the SNF.  Foley catheter was removed this afternoon at the SNF. He was sent to hospital by SNF with complaint of dark red urine in  foley bag. Pt has been complaining of pain right lower abdomen and pelvic region for the past few days. He has not had fever, nausea or vomiting.   ED Course: Patient was seen by urology in the emergency room and Foley catheter was placed placed on continuous bladder irrigation.  Patient did have clots that were removed by urology during Foley insertion.  Hospital Course:  Principal Problem:   Hematuria Active Problems:   Type 2 diabetes mellitus with diabetic neuropathy (HCC)   Dementia without behavioral disturbance (HCC)   Essential hypertension   Leukocytosis   Thrombocytopenia (HCC)   HFrEF (heart failure with reduced ejection fraction) (HCC)   Pelvic pain  Hematuria /chronic urinary retention /prostate enlargement -present admission.   -Bedside ultrasound showed 9 3 0 cc retained urine  With Echogenic debris within the urinary bladder, likely consistent with residual blood products.  CT confirmed large bladder  clots and also showed prostate management. --Urology consulted in the ED, Foley inserted on admission with CBI S/p Cystourethroscopy with clot evacuation and fulguration on 3/15 --he did have leukocytosis initially on presentation, Urine culture was ordered but does not appear processed, he received rocephin in the hospital,wbc normalized,  per urology no need of abx at discharge --home meds Eliquis held in the hospital, ok to resume in 1-2 days per urology recommendation, -Flomax and finasteride prescribed at discharge -Follow-up  urology Foley clinic next week -Follow-up with urology Dr. Mena GoesEskridge on 5/4   Chronic systolic CHF - -echo in February showed EF 20 to 25%.  Patient appears euvolemic and overall compensated.  Takes Lasix 40 mg daily  at home. --he was on IV fluids initially , home lasix held in the hospital, resumed at discharge ---Continue losartan, metoprolol --Monitor renal function, electrolytes and volume status closely  Recent pulmonary embolism  -diagnosed on 2/4.   -PE with segmental and subsegmental in the bilateral lower lobes.  No central PE was seen at that time. --Eliquis on hold due to hematuria, resume in 1-2 days per urology recommendation  Non-insulin-dependent type 2 diabetes , uncontrolled, a1c 8% -takes Tradjenta and Metformin at home, continue --Continue sliding scale  Essential hypertension -chronic, stable.  Continue metoprolol , losartan, lasix  Dementia without behavioral disturbance -he is pleasant, not oriented to time but knows he is in the hospital  -continue Namenda, aricept --Delirium precautions:  -Lights and TV off, minimize interruptions at night -Blinds open and lights on during day -Glasses/hearing aid with patient -Frequent reorientation -PT/OT when able -Avoid sedation medications/Beers list medications    History of COVID-19 infection diagnosed on 2/4 -at outside window for isolation, no precautions needed  Procedures:  Foley insertion on admission with CBI Cystourethroscopy with clot evacuation and fulguration on 3/15  Consultations:  Urology  Palliative care  Discharge Exam: BP 111/67 (BP Location: Left Arm)   Pulse 88   Temp 98.3 F (36.8 C) (Oral)   Resp 18   SpO2 95%   General: NAD, pleasantly confused, not oriented to time but knows he is in the hospital Cardiovascular: RRR Respiratory: CTABL  Discharge Instructions You were cared for by a hospitalist during your hospital stay. If you have any questions about your discharge medications or the care you received while you were in the hospital after you are discharged, you can call the unit and asked to speak with the hospitalist on call if the hospitalist that took care of you is not available. Once you are discharged, your primary care physician will handle any further medical issues. Please note that NO REFILLS for any discharge medications will be authorized once you are discharged, as it is  imperative that you return to your primary care physician (or establish a relationship with a primary care physician if you do not have one) for your aftercare needs so that they can reassess your need for medications and monitor your lab values.  Discharge Instructions    Diet - low sodium heart healthy   Complete by: As directed    Carb modified   Increase activity slowly   Complete by: As directed      Allergies as of 02/02/2021      Reactions   Penicillins Rash      Medication List    STOP taking these medications   dexamethasone 6 MG tablet Commonly known as: DECADRON   levofloxacin 500 MG tablet Commonly known as: LEVAQUIN   linagliptin 5 MG Tabs tablet Commonly known as: TRADJENTA   loperamide HCl 1 MG/7.5ML suspension Commonly known as: IMODIUM   pantoprazole 40 MG tablet Commonly known as: PROTONIX     TAKE these medications   albuterol 108 (90 Base) MCG/ACT inhaler Commonly known as: VENTOLIN HFA Inhale 2 puffs into the lungs every 6 (six) hours. What changed:   when to take this  reasons to take this   allopurinol 300 MG tablet Commonly known as: ZYLOPRIM Take 300 mg by mouth in the morning.   apixaban 5 MG Tabs tablet Commonly known as: ELIQUIS Take 1 tablet (5 mg total) by mouth 2 (two) times daily. Resume in 1-2 days What changed: additional instructions  atorvastatin 40 MG tablet Commonly known as: LIPITOR Take 1 tablet (40 mg total) by mouth at bedtime.   benzonatate 100 MG capsule Commonly known as: Tessalon Perles Take 1 capsule (100 mg total) by mouth 3 (three) times daily as needed for cough. What changed: when to take this   finasteride 5 MG tablet Commonly known as: Proscar Take 1 tablet (5 mg total) by mouth daily.   folic acid 1 MG tablet Commonly known as: FOLVITE Take 1 tablet (1 mg total) by mouth daily.   furosemide 40 MG tablet Commonly known as: LASIX Take 1 tablet (40 mg total) by mouth daily.   insulin aspart  100 UNIT/ML injection Commonly known as: novoLOG Inject 0-9 Units into the skin 3 (three) times daily with meals. Insulin sliding scale: Blood sugar  120-150   3units                       151-200   4units                       201-250   7units                       251- 300  11units                       301-350   15uints                       351-400   20units                       >400         call MD immediately   losartan 100 MG tablet Commonly known as: COZAAR Take 0.5 tablets (50 mg total) by mouth daily.   memantine 10 MG tablet Commonly known as: NAMENDA Take 10 mg by mouth in the morning.   metFORMIN 500 MG tablet Commonly known as: GLUCOPHAGE TAKE 1 TABLET BY MOUTH EVERY DAY WITH BREAKFAST What changed:   how much to take  how to take this  when to take this  additional instructions   metoprolol succinate 50 MG 24 hr tablet Commonly known as: TOPROL-XL Take 1 tablet (50 mg total) by mouth daily. Take with or immediately following a meal.   multivitamin with minerals Tabs tablet Take 1 tablet by mouth daily.   Namzaric 28-10 MG Cp24 Generic drug: Memantine HCl-Donepezil HCl Take 1 capsule by mouth daily.   OXYGEN Inhale 4 L into the lungs continuous.   potassium chloride SA 20 MEQ tablet Commonly known as: KLOR-CON Take 2 tablets (40 mEq total) by mouth daily. What changed: how much to take   sitaGLIPtin 100 MG tablet Commonly known as: JANUVIA Take 100 mg by mouth in the morning.   tamsulosin 0.4 MG Caps capsule Commonly known as: FLOMAX Take 0.4 mg by mouth at bedtime.   thiamine 100 MG tablet Take 1 tablet (100 mg total) by mouth daily.      Allergies  Allergen Reactions  . Penicillins Rash    Follow-up Information    Bakare, Mobolaji B, MD Follow up.   Specialty: Internal Medicine Contact information: [address] [place]ae[place] [address] [phone]        Jerilee Field, MD Follow up.   Specialty:  Urology Contact information: [address] [place]  [address] [phone]        [facility] Follow up in 1 week(s).   Why: foley management  Contact information: [address] [place] [address] [phone]               The results of significant diagnostics from this hospitalization (including imaging, microbiology, ancillary and laboratory) are listed below for reference.    Significant Diagnostic Studies: DG Knee 1-2 Views Right  Result Date: 01/14/2021 CLINICAL DATA:  Knee pain with significant swelling EXAM: RIGHT KNEE - 1-2 VIEW COMPARISON:  08/11/2008 FINDINGS: No fracture or malalignment. Mild patellofemoral and lateral joint space degenerative change. Faint joint space calcifications. Generalized soft tissue swelling with small moderate suprapatellar joint effusion. Vascular calcifications. IMPRESSION: 1. No acute osseous abnormality. 2. Generalized soft tissue swelling with small to moderate suprapatellar joint effusion. Electronically Signed   By: Jasmine Pang M.D.   On: 01/14/2021 19:20   CT Head Wo Contrast  Result Date: 01/11/2021 CLINICAL DATA:  Status post fall.  Recent history of COVID positive. EXAM: CT HEAD WITHOUT CONTRAST TECHNIQUE: Contiguous axial images were obtained from the base of the skull through the vertex without intravenous contrast. COMPARISON:  December 30, 2014 FINDINGS: Brain: There is mild to moderate severity cerebral atrophy with widening of the extra-axial spaces and stable ventricular dilatation. There are areas of decreased attenuation within the white matter tracts of the supratentorial brain, consistent with microvascular disease changes. Vascular: No hyperdense vessel or unexpected calcification. Skull: Negative for an acute fracture. A stable 9 mm x 5 mm benign-appearing sclerotic focus is seen along the vertex on the right (axial CT image 73, CT series number 4). Sinuses/Orbits: No acute  finding. Other: None. IMPRESSION: 1. Generalized cerebral atrophy with stable ventriculomegaly. A component of superimposed communicating hydrocephalus cannot be excluded. 2. No acute intracranial abnormality. Electronically Signed   By: Aram Candela M.D.   On: 01/11/2021 20:34   CT Angio Chest PE W and/or Wo Contrast  Result Date: 01/11/2021 CLINICAL DATA:  Weakness and shortness of breath after discharge for COVID 19 EXAM: CT ANGIOGRAPHY CHEST WITH CONTRAST TECHNIQUE: Multidetector CT imaging of the chest was performed using the standard protocol during bolus administration of intravenous contrast. Multiplanar CT image reconstructions and MIPs were obtained to evaluate the vascular anatomy. CONTRAST:  47mL OMNIPAQUE IOHEXOL 350 MG/ML SOLN, OMNIPAQUE IOHEXOL 350 MG/ML SOLN COMPARISON:  CT 12/24/2020 FINDINGS: Cardiovascular: Initial contrast administration resulting in suboptimal bolus timing significantly limiting detection of pulmonary artery emboli. Repeat imaging was performed with a slightly improved central pulmonary artery attenuation though port technique results in persistently suboptimal distal opacification beyond the left and right main pulmonary arteries with exam further degraded by extensive respiratory motion artifact. No large central pulmonary artery filling defects are identified. A previously seen subsegmental filling defect in the right lower lobe is beyond the limits of detection on this study. Central pulmonary arteries are notably enlarged though similar to comparison. Normal heart size. No pericardial effusion. Three-vessel coronary artery atherosclerosis. Atherosclerotic plaque within the normal caliber aorta. Suboptimal opacification for a luminal assessment of the aorta. No gross aortic abnormality is discernible. Normal 3 vessel branching of the great vessels. Minimal plaque in the proximal great vessels. Mediastinum/Nodes: No mediastinal fluid or gas. Normal thyroid  gland and thoracic inlet. No acute abnormality of the trachea or esophagus. No worrisome mediastinal, hilar or axillary adenopathy. Lungs/Pleura: Diffuse heterogeneous opacities again seen throughout the lungs, with increasingly coalescent consolidative opacity  in the regions of previously seen ground-glass compatible with a worsening infectious process. Findings on a background of centrilobular and paraseptal emphysematous change. Diffuse airways thickening and scattered secretions are noted. No pneumothorax. Small left pleural effusion is present. Dependent atelectatic features bilaterally. Upper Abdomen: Stable fluid attenuation cyst in the upper pole left kidney. No acute abnormalities present in the visualized portions of the upper abdomen. Musculoskeletal: Degenerative changes are present in the imaged spine and shoulders. No acute osseous abnormality or suspicious osseous lesion. No worrisome chest wall masses or lesions. Review of the MIP images confirms the above findings. IMPRESSION: 1. Markedly suboptimal pulmonary artery assessment given technical factors as detailed above. No large central filling defects are seen. A previously seen subsegmental filling defect in the right lower lobe is beyond the limits of detection. Chronic pulmonary artery enlargement most compatible with pulmonary artery hypertension. 2. Diffuse heterogeneous opacities again seen throughout the lungs, with increasingly coalescent consolidative opacity in the regions of previously seen ground-glass compatible with a worsening infectious process. 3. Small left pleural effusion. 4. Three-vessel coronary artery atherosclerosis. 5.  Emphysema (ICD10-J43.9). 6. Aortic Atherosclerosis (ICD10-I70.0) Electronically Signed   By: Kreg Shropshire M.D.   On: 01/11/2021 22:49   CT Cervical Spine Wo Contrast  Result Date: 01/11/2021 CLINICAL DATA:  Status post fall. EXAM: CT CERVICAL SPINE WITHOUT CONTRAST TECHNIQUE: Multidetector CT imaging of  the cervical spine was performed without intravenous contrast. Multiplanar CT image reconstructions were also generated. COMPARISON:  None. FINDINGS: Alignment: There is approximately 2 mm anterolisthesis of the C2 vertebral body on C3. Skull base and vertebrae: No acute fracture. Chronic changes are seen along the tip of the dens. Soft tissues and spinal canal: No prevertebral fluid or swelling. No visible canal hematoma. Disc levels: Marked severity endplate sclerosis and anterior osteophyte formation is seen at the levels of C3-C4, C4-C5, C5-C6 and C6-C7. Moderate severity intervertebral disc space narrowing is also seen at the levels of C4-C5 and C5-C6. Marked severity intervertebral disc space narrowing is present at the level of C6-C7. Marked severity bilateral multilevel facet joint hypertrophy is noted. Upper chest: Negative. Other: None. IMPRESSION: 1. Marked severity multilevel degenerative changes, most prominent at the levels of C3-C4, C4-C5, C5-C6 and C6-C7. 2. No acute cervical spine fracture. Electronically Signed   By: Aram Candela M.D.   On: 01/11/2021 20:38   US PELVIS (TRANSABDOMINAL ONLY)  Result Date: 01/29/2021 CLINICAL DATA:  Gross hematuria. EXAM: PELVIC ULTRASOUND COMPLETE COMPARISON:  None. FINDINGS: Bladder: A Foley catheter is seen within the urinary bladder which measures 3.6 cm x 2.5 cm x 8.3 cm (61.8 cm3). A mild-to-moderate amount of layering heterogeneous echogenic debris is seen within the dependent portion of the bladder lumen. This area measures approximately 8.3 cm x 5.6 cm x 2.5 cm. IMPRESSION: Echogenic debris within the urinary bladder, likely consistent with residual blood products. Electronically Signed   By: Aram Candela M.D.   On: 01/29/2021 22:04   US Renal  Result Date: 01/29/2021 CLINICAL DATA:  Urinary retention, evaluate for bladder clot EXAM: RENAL / URINARY TRACT ULTRASOUND COMPLETE COMPARISON:  None. FINDINGS: Right Kidney: Renal measurements:  10.5 x 5.8 x 5.4 cm = volume: 170 mL. Echogenicity within normal limits. Simple cysts. No mass or hydronephrosis visualized. Left Kidney: Renal measurements: 11.1 x 3 2 x 5.4 cm = volume: 193 mL. Echogenicity within normal limits. No mass or hydronephrosis visualized. Bladder: Large volume of clot and debris within the urinary bladder. Prevoid volume 931 ml. Other: None.  IMPRESSION: 1. Large volume of clot and debris within the urinary bladder. Bladder volume 931 mL. 2.  No hydronephrosis. Electronically Signed   By: Lauralyn Primes M.D.   On: 01/29/2021 19:50   DG CHEST PORT 1 VIEW  Result Date: 01/29/2021 CLINICAL DATA:  Leukocytosis EXAM: PORTABLE CHEST 1 VIEW COMPARISON:  01/12/2021 FINDINGS: Single frontal view of the chest demonstrates a stable cardiac silhouette. Chronic ectasia of the thoracic aorta. Fibrotic changes are seen within the lung bases. Marked improvement in the bilateral areas of airspace disease seen on prior study. No effusion or pneumothorax. No acute bony abnormalities. IMPRESSION: 1. Basilar predominant fibrotic changes. 2. Resolution of the bilateral airspace disease seen previously. Electronically Signed   By: Sharlet Salina M.D.   On: 01/29/2021 22:33   DG CHEST PORT 1 VIEW  Result Date: 01/12/2021 CLINICAL DATA:  Hypoxia. EXAM: PORTABLE CHEST 1 VIEW COMPARISON:  January 11, 2021. FINDINGS: Stable cardiomediastinal silhouette. No pneumothorax is noted. Stable bilateral lung opacities are noted concerning for pneumonia. Bony thorax is unremarkable. IMPRESSION: Stable bilateral lung opacities are noted concerning for pneumonia. Electronically Signed   By: Lupita Raider M.D.   On: 01/12/2021 08:12   DG Chest Portable 1 View  Result Date: 01/11/2021 CLINICAL DATA:  80 year old male with fall. EXAM: PORTABLE CHEST 1 VIEW COMPARISON:  Chest radiograph dated 12/27/2020. FINDINGS: Background of emphysema. Diffuse interstitial coarsening primarily involving the mid to lower lung field  and peripherally. Interval progression of associated airspace densities since the prior radiograph concerning for worsening pneumonia. Clinical correlation is recommended. No large pleural effusion. No pneumothorax. Stable cardiac silhouette. No acute osseous pathology. IMPRESSION: 1. Findings concerning for worsening pneumonia. Clinical correlation is recommended. 2. Emphysema. Electronically Signed   By: Elgie Collard M.D.   On: 01/11/2021 20:05   CT RENAL STONE STUDY  Result Date: 01/29/2021 CLINICAL DATA:  Hematuria, blood clots in Foley catheter, right inguinal pain EXAM: CT ABDOMEN AND PELVIS WITHOUT CONTRAST TECHNIQUE: Multidetector CT imaging of the abdomen and pelvis was performed following the standard protocol without IV contrast. COMPARISON:  01/29/2021 FINDINGS: Lower chest: Diffuse fibrotic changes are seen throughout the lung bases. No acute pleural or parenchymal lung disease. Unenhanced CT was performed per clinician order. Lack of IV contrast limits sensitivity and specificity, especially for evaluation of abdominal/pelvic solid viscera. Hepatobiliary: Calcified gallstones layer dependently within the gallbladder. No evidence of acute cholecystitis. Unenhanced imaging of the liver is unremarkable. Pancreas: Unremarkable. No pancreatic ductal dilatation or surrounding inflammatory changes. Spleen: Normal in size without focal abnormality. Adrenals/Urinary Tract: No evidence of urinary tract calculi or obstructive uropathy. There are bilateral simple renal cysts, with a 1.5 cm hyperdense cyst lateral aspect right kidney. These were visualized on preceding renal ultrasound. The adrenals are unremarkable. Foley catheter is seen within the urinary bladder. High attenuation material within the bladder lumen consistent with blood products seen on recent ultrasound. Stomach/Bowel: No bowel obstruction or ileus. Normal appendix right lower quadrant. Scattered diverticulosis of the distal colon  without diverticulitis. No bowel wall thickening or inflammatory change. Vascular/Lymphatic: Aortic atherosclerosis. No enlarged abdominal or pelvic lymph nodes. Reproductive: Prostate is enlarged measuring 6.4 x 6.0 x 5.5 cm. Limited visualization of the scrotum demonstrates a right-sided hydrocele. Other: There is no free fluid or free gas. No abdominal wall hernia. Musculoskeletal: No acute or destructive bony lesions. Extensive spondylosis and facet hypertrophy throughout the lumbar spine. Reconstructed images demonstrate no additional findings. IMPRESSION: 1. High attenuation material within the bladder lumen consistent  with blood products identified on recent ultrasound. Foley catheter appears appropriately positioned. 2. Enlarged prostate. 3. Cholelithiasis without cholecystitis. 4. Right-sided hydrocele, partially imaged. 5. Bilateral simple and hyperdense renal cysts, evaluated on preceding ultrasound. 6. Aortic Atherosclerosis (ICD10-I70.0) and Emphysema (ICD10-J43.9). Electronically Signed   By: Sharlet Salina M.D.   On: 01/29/2021 22:51   ECHOCARDIOGRAM LIMITED  Result Date: 01/12/2021    ECHOCARDIOGRAM LIMITED REPORT   Patient Name:   DAMEL QUERRY Date of Exam: 01/12/2021 Medical Rec #:  [id number]       Height:       72.0 in Accession #:    [id number]      Weight:       180.8 lb Date of Birth:  1941-10-18        BSA:          2.041 m Patient Age:    79 years        BP:           150/139 mmHg Patient Gender: M               HR:           101 bpm. Exam Location:  Inpatient Procedure: Limited Echo, Limited Color Doppler and Cardiac Doppler Indications:    CHF-Acute Systolic I50.21                 CHF-Acute Diastolic I50.31  History:        Patient has prior history of Echocardiogram examinations, most                 recent 12/25/2020. CHF; Risk Factors:Dyslipidemia, Hypertension                 and Diabetes.  Sonographer:    Eulah Pont RDCS Referring Phys: [phone] JARED M GARDNER IMPRESSIONS  1. Left  ventricular ejection fraction, by estimation, is 20 to 25%. The left ventricle has severely decreased function. The left ventricle demonstrates global hypokinesis. Left ventricular diastolic function could not be evaluated.  2. Right ventricular systolic function is mildly reduced. The right ventricular size is normal. There is mildly elevated pulmonary artery systolic pressure. The estimated right ventricular systolic pressure is 40.7 mmHg.  3. The mitral valve is normal in structure. Trivial mitral valve regurgitation. No evidence of mitral stenosis.  4. The aortic valve was not well visualized. Aortic valve regurgitation is mild. Mild aortic valve sclerosis is present, with no evidence of aortic valve stenosis. Aortic regurgitation PHT measures 501 msec.  5. The inferior vena cava is normal in size with greater than 50% respiratory variability, suggesting right atrial pressure of 3 mmHg. FINDINGS  Left Ventricle: Left ventricular ejection fraction, by estimation, is 20 to 25%. The left ventricle has severely decreased function. The left ventricle demonstrates global hypokinesis. The left ventricular internal cavity size was normal in size. There is no left ventricular hypertrophy. Left ventricular diastolic function could not be evaluated. Right Ventricle: The right ventricular size is normal. No increase in right ventricular wall thickness. Right ventricular systolic function is mildly reduced. There is mildly elevated pulmonary artery systolic pressure. The tricuspid regurgitant velocity  is 3.07 m/s, and with an assumed right atrial pressure of 3 mmHg, the estimated right ventricular systolic pressure is 40.7 mmHg. Left Atrium: Left atrial size was normal in size. Right Atrium: Right atrial size was normal in size. Pericardium: There is no evidence of pericardial effusion. Mitral Valve: The mitral valve is normal in structure. Trivial mitral  valve regurgitation. No evidence of mitral valve stenosis. Tricuspid  Valve: The tricuspid valve is normal in structure. Tricuspid valve regurgitation is mild . No evidence of tricuspid stenosis. Aortic Valve: The aortic valve was not well visualized. Aortic valve regurgitation is mild. Aortic regurgitation PHT measures 501 msec. Mild aortic valve sclerosis is present, with no evidence of aortic valve stenosis. Pulmonic Valve: The pulmonic valve was normal in structure. Pulmonic valve regurgitation is not visualized. No evidence of pulmonic stenosis. Aorta: The aortic root is normal in size and structure. Venous: The inferior vena cava is normal in size with greater than 50% respiratory variability, suggesting right atrial pressure of 3 mmHg. IAS/Shunts: No atrial level shunt detected by color flow Doppler. LEFT VENTRICLE PLAX 2D LVIDd:         5.20 cm LVIDs:         4.40 cm LV PW:         0.80 cm LV IVS:        0.90 cm LVOT diam:     1.60 cm LV SV:         26 LV SV Index:   13 LVOT Area:     2.01 cm  LV Volumes (MOD) LV vol d, MOD A2C: 131.0 ml LV vol d, MOD A4C: 117.0 ml LV vol s, MOD A2C: 99.3 ml LV vol s, MOD A4C: 71.4 ml LV SV MOD A2C:     31.7 ml LV SV MOD A4C:     117.0 ml LV SV MOD BP:      38.2 ml RIGHT VENTRICLE TAPSE (M-mode): 1.4 cm LEFT ATRIUM         Index LA diam:    2.10 cm 1.03 cm/m  AORTIC VALVE LVOT Vmax:   85.50 cm/s LVOT Vmean:  56.200 cm/s LVOT VTI:    0.129 m AI PHT:      501 msec  AORTA Ao Root diam: 3.30 cm TRICUSPID VALVE TR Peak grad:   37.7 mmHg TR Vmax:        307.00 cm/s  SHUNTS Systemic VTI:  0.13 m Systemic Diam: 1.60 cm Armanda Magic MD Electronically signed by Armanda Magic MD Signature Date/Time: 01/12/2021/5:06:13 PM    Final     Microbiology: Recent Results (from the past 240 hour(s))  SARS CORONAVIRUS 2 (TAT 6-24 HRS) Nasopharyngeal Nasopharyngeal Swab     Status: None   Collection Time: 02/01/21  4:43 PM   Specimen: Nasopharyngeal Swab  Result Value Ref Range Status   SARS Coronavirus 2 NEGATIVE NEGATIVE Final    Comment:  (NOTE) SARS-CoV-2 target nucleic acids are NOT DETECTED.  The SARS-CoV-2 RNA is generally detectable in upper and lower respiratory specimens during the acute phase of infection. Negative results do not preclude SARS-CoV-2 infection, do not rule out co-infections with other pathogens, and should not be used as the sole basis for treatment or other patient management decisions. Negative results must be combined with clinical observations, patient history, and epidemiological information. The expected result is Negative.  Fact Sheet for Patients: HairSlick.no  Fact Sheet for Healthcare Providers: quierodirigir.com  This test is not yet approved or cleared by the [place] FDA and  has been authorized for detection and/or diagnosis of SARS-CoV-2 by FDA under an Emergency Use Authorization (EUA). This EUA will remain  in effect (meaning this test can be used) for the duration of the COVID-19 declaration under Se ction 564(b)(1) of the Act, 21 U.S.C. section 360bbb-3(b)(1), unless the authorization is terminated or revoked  sooner.  Performed at [facility], [address] [place], [place] [address]      Labs: Basic Metabolic Panel: Recent Labs  Lab 01/29/21 1850 01/30/21 0329 01/31/21 0210  NA 134* 139 138  K 4.8 4.7 4.1  CL 103 106 106  CO2 21* 26 24  GLUCOSE 189* 177* 125*  BUN 33* 34* 23  CREATININE 1.25* 1.09 0.87  CALCIUM 8.4* 8.8* 8.7*  MG  --   --  1.6*  PHOS  --   --  3.2   Liver Function Tests: Recent Labs  Lab 01/31/21 0210  ALBUMIN 2.6*   No results for input(s): LIPASE, AMYLASE in the last 168 hours. No results for input(s): AMMONIA in the last 168 hours. CBC: Recent Labs  Lab 01/29/21 1850 01/30/21 0329 01/31/21 0210 02/01/21 0315 02/02/21 0856  WBC 19.7* 13.0* 10.2 8.7 10.2  NEUTROABS 14.5*  --  6.8  --   --   HGB 12.5* 11.3* 10.1* 10.3* 9.8*  HCT 38.1* 32.0* 27.7* 30.3* 28.6*   MCV 88.4 83.6 82.0 84.9 84.6  PLT 58* 58* 65* 68* 90*   Cardiac Enzymes: No results for input(s): CKTOTAL, CKMB, CKMBINDEX, TROPONINI in the last 168 hours. BNP: BNP (last 3 results) Recent Labs    12/24/20 0928 12/28/20 0744 01/11/21 2020  BNP 553.8* 347.9* 697.7*    ProBNP (last 3 results) No results for input(s): PROBNP in the last 8760 hours.  CBG: Recent Labs  Lab 02/01/21 1143 02/01/21 1225 02/01/21 1816 02/01/21 2121 02/02/21 0734  GLUCAP 108* 128* 197* 164* 168*       Signed:  Albertine Grates MD, PhD, FACP  [facility] 02/02/2021, 11:07 AM

## 2021-02-02 NOTE — TOC Transition Note (Signed)
Transition of Care [facility]) - CM/SW Discharge Note   Patient Details  Name: Gary Mcneil MRN: [id number] Date of Birth: Apr 19, 1941  Transition of Care [facility]) CM/SW Contact:  Jimmy Picket, [place] Phone Number: 02/02/2021, 1:08 PM   Clinical Narrative:     Pt will discharge to [facility] via ptar. Pt is covid negative. Pt and son Giovanie have been notified. Ptar transportation have been requested.   Nurse to call report to [phone].  Final next level of care: Skilled Nursing Facility Barriers to Discharge: Barriers Resolved   Patient Goals and CMS Choice        Discharge Placement              Patient chooses bed at: Other - please specify in the comment section below: ([facility]) Patient to be transferred to facility by: ptar Name of family member notified: Son, Eliezer Khawaja Patient and family notified of of transfer: 02/02/21  Discharge Plan and Services                                     Social Determinants of Health (SDOH) Interventions     Readmission Risk Interventions Readmission Risk Prevention Plan 12/27/2020  Transportation Screening Complete  PCP or Specialist Appt within 5-7 Days Complete  Home Care Screening Complete  Medication Review (RN CM) Referral to Pharmacy  Some recent data might be hidden    Jimmy Picket, Theresia Majors, [facility] Clinical Social Worker [phone]

## 2021-02-04 ENCOUNTER — Emergency Department (HOSPITAL_COMMUNITY)
Admission: EM | Admit: 2021-02-04 | Discharge: 2021-02-04 | Disposition: A | Discharge status: Home / Self Care | Payer: Medicare Other | Attending: Emergency Medicine | Admitting: Emergency Medicine

## 2021-02-04 ENCOUNTER — Other Ambulatory Visit: Payer: Self-pay

## 2021-02-04 ENCOUNTER — Encounter (HOSPITAL_COMMUNITY): Payer: Self-pay

## 2021-02-04 DIAGNOSIS — E785 Hyperlipidemia, unspecified: Secondary | ICD-10-CM | POA: Diagnosis not present

## 2021-02-04 DIAGNOSIS — Z794 Long term (current) use of insulin: Secondary | ICD-10-CM | POA: Insufficient documentation

## 2021-02-04 DIAGNOSIS — E1140 Type 2 diabetes mellitus with diabetic neuropathy, unspecified: Secondary | ICD-10-CM | POA: Diagnosis not present

## 2021-02-04 DIAGNOSIS — E1169 Type 2 diabetes mellitus with other specified complication: Secondary | ICD-10-CM | POA: Diagnosis not present

## 2021-02-04 DIAGNOSIS — Z7984 Long term (current) use of oral hypoglycemic drugs: Secondary | ICD-10-CM | POA: Insufficient documentation

## 2021-02-04 DIAGNOSIS — Z87891 Personal history of nicotine dependence: Secondary | ICD-10-CM | POA: Insufficient documentation

## 2021-02-04 DIAGNOSIS — F0390 Unspecified dementia without behavioral disturbance: Secondary | ICD-10-CM | POA: Insufficient documentation

## 2021-02-04 DIAGNOSIS — R339 Retention of urine, unspecified: Secondary | ICD-10-CM | POA: Insufficient documentation

## 2021-02-04 DIAGNOSIS — Z79899 Other long term (current) drug therapy: Secondary | ICD-10-CM | POA: Insufficient documentation

## 2021-02-04 DIAGNOSIS — Z7901 Long term (current) use of anticoagulants: Secondary | ICD-10-CM | POA: Diagnosis not present

## 2021-02-04 LAB — BASIC METABOLIC PANEL
Anion gap: 11 (ref 5–15)
BUN: 17 mg/dL (ref 8–23)
CO2: 24 mmol/L (ref 22–32)
Calcium: 8.6 mg/dL — ABNORMAL LOW (ref 8.9–10.3)
Chloride: 103 mmol/L (ref 98–111)
Creatinine, Ser: 0.85 mg/dL (ref 0.61–1.24)
GFR, Estimated: >60 mL/min (ref >60)
Glucose, Bld: 118 mg/dL — ABNORMAL HIGH (ref 70–99)
Potassium: 3.3 mmol/L — ABNORMAL LOW (ref 3.5–5.1)
Sodium: 138 mmol/L (ref 135–145)

## 2021-02-04 LAB — CBC WITH DIFFERENTIAL/PLATELET
Abs Immature Granulocytes: 0.1 10*3/uL — ABNORMAL HIGH (ref 0.00–0.07)
Basophils Absolute: 0.1 10*3/uL (ref 0.0–0.1)
Basophils Relative: 1 %
Eosinophils Absolute: 0.4 10*3/uL (ref 0.0–0.5)
Eosinophils Relative: 4 %
HCT: 27.8 % — ABNORMAL LOW (ref 39.0–52.0)
Hemoglobin: 9.6 g/dL — ABNORMAL LOW (ref 13.0–17.0)
Immature Granulocytes: 1 %
Lymphocytes Relative: 30 %
Lymphs Abs: 2.5 10*3/uL (ref 0.7–4.0)
MCH: 29.4 pg (ref 26.0–34.0)
MCHC: 34.5 g/dL (ref 30.0–36.0)
MCV: 85 fL (ref 80.0–100.0)
Monocytes Absolute: 0.7 10*3/uL (ref 0.1–1.0)
Monocytes Relative: 9 %
Neutro Abs: 4.6 10*3/uL (ref 1.7–7.7)
Neutrophils Relative %: 55 %
Platelets: 120 10*3/uL — ABNORMAL LOW (ref 150–400)
RBC: 3.27 MIL/uL — ABNORMAL LOW (ref 4.22–5.81)
RDW: 15.7 % — ABNORMAL HIGH (ref 11.5–15.5)
WBC: 8.3 10*3/uL (ref 4.0–10.5)
nRBC: 0 % (ref 0.0–0.2)

## 2021-02-04 LAB — URINALYSIS, ROUTINE W REFLEX MICROSCOPIC
Bilirubin Urine: NEGATIVE
Glucose, UA: NEGATIVE mg/dL
Ketones, ur: NEGATIVE mg/dL
Leukocytes,Ua: NEGATIVE
Nitrite: NEGATIVE
Protein, ur: 30 mg/dL — AB
RBC / HPF: >50 RBC/hpf — ABNORMAL HIGH (ref 0–5)
Specific Gravity, Urine: 1.012 (ref 1.005–1.030)
pH: 6 (ref 5.0–8.0)

## 2021-02-04 MED ORDER — HALOPERIDOL LACTATE 5 MG/ML IJ SOLN
2.5000 mg | Freq: Once | INTRAMUSCULAR | Status: AC
  Administered 2021-02-04: 2.5 mg via INTRAMUSCULAR
  Filled 2021-02-04: qty 1

## 2021-02-04 NOTE — ED Notes (Signed)
Pt resting in bed at this time. Equal rise and fall of chest. Pt appears to be sleeping. NAD noted

## 2021-02-04 NOTE — Discharge Instructions (Addendum)
Gary Mcneil was seen in the emergency department today for his urinary retention.  He had a new catheter placed with drainage of nearly 2 L of urine from his bladder.  He tolerated the procedure well and was able to eat and drink prior to his departure.  He endorses resolution of his abdominal pain at this time and his vital signs remained stable throughout stay in the ER.  Additionally his hemoglobin is stable from his recent discharge lab work.  He should have his hemoglobin reevaluated within the next week.  Below is the contact information for Dr. Annabell Howells, one of the urologists with whom he should follow-up in the next 2 weeks or with the urologist with whom he already has appointments scheduled.   Return to the emergency department if he removes his catheter again, if he develops any new abdominal pain, nausea or vomiting does not stop, or any other new severe symptoms.

## 2021-02-04 NOTE — ED Notes (Signed)
Pt provided with sandwich and coke at this time.

## 2021-02-04 NOTE — ED Notes (Signed)
Attempted to insert foley. Unable to insert foley. Pt remains combative. Attempted 18 fr Coude foley. Minimal resistance met, however unable to pass through prostate. Bleeding noted prior to insertion. PA made aware.

## 2021-02-04 NOTE — Consult Note (Signed)
Subjective: CC: Urinary retention with difficult foley placement.    Consult requested by Dr. Kristine RoyalPeter Mcneil.  Gary Mcneil is a patient of Dr. Mena Mcneil in our office.   He has a history of urinary retention that is currently being managed with a foley.   He has been started on tamsulosin and finasteride after his recent diagnosis but has a large prostate.   He pulled his foley out this morning and the nursing staff was unable to replace it.  He is in the ER now with a markedly distended bladder and agitation.   ROS:  Review of Systems  Unable to perform ROS: Dementia    Allergies  Allergen Reactions  . Penicillins Rash    Past Medical History:  Diagnosis Date  . "walking corpse" syndrome   . Acute kidney failure, unspecified (HCC)   . Acute kidney failure, unspecified (HCC)   . Acute on chronic combined systolic and diastolic CHF (congestive heart failure) (HCC) 01/12/2021  . Anemia, unspecified   . Arthritis   . Dementia   . Dementia (HCC) 04/24/2012  . Disorder of bone and cartilage, unspecified   . Dizziness and giddiness   . GERD (gastroesophageal reflux disease)   . Gout   . Gout, unspecified   . Hypertension   . Hypopotassemia   . Hypotension, unspecified   . Hypotension, unspecified   . Memory loss   . Memory loss   . Muscle weakness (generalized)   . Myalgia and myositis, unspecified   . Neuromuscular disorder (HCC)   . Other and unspecified hyperlipidemia   . Screening for ischemic heart disease   . Tobacco use disorder   . Type II or unspecified type diabetes mellitus without mention of complication, uncontrolled   . Type II or unspecified type diabetes mellitus without mention of complication, uncontrolled   . Unspecified hereditary and idiopathic peripheral neuropathy   . Unspecified vitamin D deficiency     Past Surgical History:  Procedure Laterality Date  . CYSTOSCOPY N/A 02/01/2021   Procedure: CYSTOSCOPY WITH FULGERATION;  Surgeon: Jerilee FieldEskridge, Matthew,  MD;  Location: [facility] ORS;  Service: Urology;  Laterality: N/A;  . INGUINAL HERNIA REPAIR    . VIDEO ASSISTED THORACOSCOPY (VATS)/EMPYEMA Right 04/30/2015   Procedure: VIDEO ASSISTED THORACOSCOPY (VATS)/DRAIN EMPYEMA;  Surgeon: Kerin PernaPeter Van Trigt, MD;  [facility];  Service: Thoracic;  Laterality: Right;  [name]. VIDEO BRONCHOSCOPY N/A 04/30/2015   Procedure: VIDEO BRONCHOSCOPY;  Surgeon: Kerin PernaPeter Van Trigt, MD;  [facility];  Service: Thoracic;  Laterality: N/A;    Social History   Socioeconomic History  . Marital status: Single    Spouse name: Not on file  . Number of children: 3  . Years of education: Not on file  . Highest education level: Not on file  Occupational History  . Occupation: Retired Charity fundraiserchemist from [employer]  . Occupation: Retired Education officer, environmentalpastor  Tobacco Use  . Smoking status: Former Smoker    Quit date: 11/20/1969    Years since quitting: 51.2  . Smokeless tobacco: Never Used  Vaping Use  . Vaping Use: Never used  Substance and Sexual Activity  . Alcohol use: Yes    Comment: daily- to every other day, combination or beer, wine, and liquor.per son patient drinks beer and liquor heavy  . Drug use: Not Currently    Types: Marijuana  . Sexual activity: Not Currently  Other Topics Concern  . Not on file  Social History Narrative   Bachelors in Nurse, adultChemistry   Masters in  [employer]   Attended [employer] and [employer]            Social Determinants of Health   Financial Resource Strain: Low Risk   . Difficulty of Paying Living Expenses: Not hard at all  Food Insecurity: No Food Insecurity  . Worried About Programme researcher, broadcasting/film/video in the Last Year: Never true  . Ran Out of Food in the Last Year: Never true  Transportation Needs: No Transportation Needs  . Lack of Transportation (Medical): No  . Lack of Transportation (Non-Medical): No  Physical Activity: Not on file  Stress: Not on file  Social Connections: Not on file  Intimate Partner Violence: Not on file    Family History   Problem Relation Age of Onset  . Diabetes Mother   . Hypertension Mother   . Diabetes Father   . Hypertension Father   . Kidney disease Father   . Diabetes Sister   . Kidney disease Brother     Anti-infectives: Anti-infectives (From admission, onward)   None      No current facility-administered medications for this encounter.   Current Outpatient Medications  Medication Sig Dispense Refill  . albuterol (VENTOLIN HFA) 108 (90 Base) MCG/ACT inhaler Inhale 2 puffs into the lungs every 6 (six) hours. (Patient taking differently: Inhale 2 puffs into the lungs every 6 (six) hours as needed (acute respiratory failure with hypoxia).) 6.7 g 0  . allopurinol (ZYLOPRIM) 300 MG tablet Take 300 mg by mouth in the morning.    [name] apixaban (ELIQUIS) 5 MG TABS tablet Take 1 tablet (5 mg total) by mouth 2 (two) times daily. Resume in 1-2 days 60 tablet 0  . atorvastatin (LIPITOR) 40 MG tablet Take 1 tablet (40 mg total) by mouth at bedtime. 30 tablet 0  . benzonatate (TESSALON PERLES) 100 MG capsule Take 1 capsule (100 mg total) by mouth 3 (three) times daily as needed for cough. (Patient taking differently: Take 100 mg by mouth every 8 (eight) hours as needed for cough.) 30 capsule 0  . finasteride (PROSCAR) 5 MG tablet Take 1 tablet (5 mg total) by mouth daily. 90 tablet 3  . folic acid (FOLVITE) 1 MG tablet Take 1 tablet (1 mg total) by mouth daily. 30 tablet 0  . furosemide (LASIX) 40 MG tablet Take 1 tablet (40 mg total) by mouth daily. 30 tablet 0  . insulin aspart (NOVOLOG) 100 UNIT/ML injection Inject 0-9 Units into the skin 3 (three) times daily with meals. Insulin sliding scale: Blood sugar  120-150   3units                       151-200   4units                       201-250   7units                       251- 300  11units                       301-350   15uints                       351-400   20units                       >400         call  MD immediately 10 mL 11  . losartan (COZAAR)  100 MG tablet Take 0.5 tablets (50 mg total) by mouth daily. 30 tablet 0  . memantine (NAMENDA) 10 MG tablet Take 10 mg by mouth in the morning.    . Memantine HCl-Donepezil HCl (NAMZARIC) 28-10 MG CP24 Take 1 capsule by mouth daily. (Patient not taking: No sig reported) 90 capsule 1  . metFORMIN (GLUCOPHAGE) 500 MG tablet TAKE 1 TABLET BY MOUTH EVERY DAY WITH BREAKFAST (Patient taking differently: Take 500 mg by mouth in the morning.) 90 tablet 1  . metoprolol succinate (TOPROL-XL) 50 MG 24 hr tablet Take 1 tablet (50 mg total) by mouth daily. Take with or immediately following a meal. 30 tablet 0  . Multiple Vitamin (MULTIVITAMIN WITH MINERALS) TABS tablet Take 1 tablet by mouth daily.    . OXYGEN Inhale 4 L into the lungs continuous.    . potassium chloride SA (KLOR-CON) 20 MEQ tablet Take 2 tablets (40 mEq total) by mouth daily. 90 tablet 3  . sitaGLIPtin (JANUVIA) 100 MG tablet Take 100 mg by mouth in the morning.    . tamsulosin (FLOMAX) 0.4 MG CAPS capsule Take 0.4 mg by mouth at bedtime.    . thiamine 100 MG tablet Take 1 tablet (100 mg total) by mouth daily. 30 tablet 0     Objective: Vital signs in last 24 hours: BP (!) 145/75   Pulse 74   Temp 98.2 F (36.8 C) (Oral)   Resp 15   Ht 6' (1.829 m)   Wt 70.3 kg   SpO2 99%   BMI 21.02 kg/m   Intake/Output from previous day: No intake/output data recorded. Intake/Output this shift: No intake/output data recorded.   Physical Exam Constitutional:      Appearance: Normal appearance.  Abdominal:     Comments: Bladder is distended to the umbilicus.   Genitourinary:    Comments: Normal circumcised phallus with adequate Meatus.  Neurological:     Mental Status: He is alert.  Psychiatric:     Comments: Agitated when the foley was mentioned.      Lab Results:  Results for orders placed or performed during the hospital encounter of 02/04/21 (from the past 24 hour(s))  Basic metabolic panel     Status: Abnormal    Collection Time: 02/04/21  8:27 AM  Result Value Ref Range   Sodium 138 135 - 145 mmol/L   Potassium 3.3 (L) 3.5 - 5.1 mmol/L   Chloride 103 98 - 111 mmol/L   CO2 24 22 - 32 mmol/L   Glucose, Bld 118 (H) 70 - 99 mg/dL   BUN 17 8 - 23 mg/dL   Creatinine, Ser 4.81 0.61 - 1.24 mg/dL   Calcium 8.6 (L) 8.9 - 10.3 mg/dL   GFR, Estimated >85 >63 mL/min   Anion gap 11 5 - 15  CBC with Differential     Status: Abnormal   Collection Time: 02/04/21  8:27 AM  Result Value Ref Range   WBC 8.3 4.0 - 10.5 K/uL   RBC 3.27 (L) 4.22 - 5.81 MIL/uL   Hemoglobin 9.6 (L) 13.0 - 17.0 g/dL   HCT 14.9 (L) 70.2 - 63.7 %   MCV 85.0 80.0 - 100.0 fL   MCH 29.4 26.0 - 34.0 pg   MCHC 34.5 30.0 - 36.0 g/dL   RDW 85.8 (H) 85.0 - 27.7 %   Platelets 120 (L) 150 - 400 K/uL   nRBC 0.0 0.0 - 0.2 %  Neutrophils Relative % 55 %   Neutro Abs 4.6 1.7 - 7.7 K/uL   Lymphocytes Relative 30 %   Lymphs Abs 2.5 0.7 - 4.0 K/uL   Monocytes Relative 9 %   Monocytes Absolute 0.7 0.1 - 1.0 K/uL   Eosinophils Relative 4 %   Eosinophils Absolute 0.4 0.0 - 0.5 K/uL   Basophils Relative 1 %   Basophils Absolute 0.1 0.0 - 0.1 K/uL   Immature Granulocytes 1 %   Abs Immature Granulocytes 0.10 (H) 0.00 - 0.07 K/uL  Urinalysis, Routine w reflex microscopic Urine, Catheterized     Status: Abnormal   Collection Time: 02/04/21 10:30 AM  Result Value Ref Range   Color, Urine YELLOW YELLOW   APPearance CLEAR CLEAR   Specific Gravity, Urine 1.012 1.005 - 1.030   pH 6.0 5.0 - 8.0   Glucose, UA NEGATIVE NEGATIVE mg/dL   Hgb urine dipstick LARGE (A) NEGATIVE   Bilirubin Urine NEGATIVE NEGATIVE   Ketones, ur NEGATIVE NEGATIVE mg/dL   Protein, ur 30 (A) NEGATIVE mg/dL   Nitrite NEGATIVE NEGATIVE   Leukocytes,Ua NEGATIVE NEGATIVE   RBC / HPF >50 (H) 0 - 5 RBC/hpf   WBC, UA 0-5 0 - 5 WBC/hpf   Bacteria, UA RARE (A) NONE SEEN    BMET Recent Labs    02/04/21 0827  NA 138  K 3.3*  CL 103  CO2 24  GLUCOSE 118*  BUN 17   CREATININE 0.85  CALCIUM 8.6*   PT/INR No results for input(s): LABPROT, INR in the last 72 hours. ABG No results for input(s): PHART, HCO3 in the last 72 hours.  Invalid input(s): PCO2, PO2  Studies/Results: No results found.  Procedure:  Foley placement.  He was given haldol for the agitation and the nursing staff secured his mittened hands.  He was prepped with betadine and intraurethral lubricant.  Sterile drapes were placed.  An 45fr coude foley was easily passed into the bladder with return of clear urine.   The balloon was filled with 49ml to try to prevent removal.      Assessment/Plan: No problem-specific Assessment & Plan notes found for this encounter.         No follow-ups on file.    CC: Dr. Karma Greaser and Dr. Kristine Mcneil.     Bjorn Pippin 02/04/2021 [phone]

## 2021-02-04 NOTE — ED Notes (Signed)
Pt pulled off nasal cannula. Nasal cannula placed back on pt-3L O2 administered per pts baseline

## 2021-02-04 NOTE — ED Triage Notes (Addendum)
Pt arrives EMS from [facility]. Pt recently admitted for gross hematuria. Pt or staff removed catheter and pt will not allow them to reinsert. Pt becomes aggressive stating his penis hurts. Hx dementia. 3L [place] at baseline.

## 2021-02-04 NOTE — ED Notes (Signed)
PTAR notified need for transport at this time.  

## 2021-02-04 NOTE — ED Provider Notes (Signed)
[facility]-EMERGENCY DEPT Provider Note   CSN: [id number] Arrival date & time: 02/04/21  0327     History No chief complaint on file.   Gary Mcneil is a 80 y.o. male  who presents via EMS from [facility] for replacement of Foley catheter and evaluation of exacerbated confusion and aggression.  The patient's blood influence patient's nurse at a cardiovascular states that the patient has pleasantly confused at baseline.  She states he has severe BPH and is unable to urinate on his own, his Foley catheter dependent.  He has history of severe hematuria as he is on Eliquis, was recently admitted for this issue.  He pulls out his Foley catheters routinely, pulled out his Foley catheter yesterday.  His nurse states when she arrived there around 7 PM patient had not urinated for several hours; when they tried to replace his Foley catheter he became extremely agitated and aggressive, trying to hit providers.  As he had not urinated for nearly 12 hours at that time and would not allow them to place a Foley, and they are unable to chemically or physically restrain patients at [facility], they had him moved to the emergency department where Foley catheter can be replaced.  According to patient's nurse, his abdomen is usually flat and soft, but is distended and firm at this time.  LEVEL 5 CAVEAT due to patient's underlying dementia.  Appreciate collateral history obtained from patient's nurse at his facility.  I personally reviewed this patient's medical records.  He has history of hypertension, bulimia, type 2 diabetes, dementia, CHF, on Eliquis. Patient recently admitted for hematuria; underwent cystoscopy on 02/01/2021 was found to have fibrovascular tissue in the bladder neck that was actively mildly oozing.  This area was fulgurated with hemostasis and several clots were evaluated from the bladder.   HPI     Past Medical History:  Diagnosis Date  .  "walking corpse" syndrome   . Acute kidney failure, unspecified (HCC)   . Acute kidney failure, unspecified (HCC)   . Acute on chronic combined systolic and diastolic CHF (congestive heart failure) (HCC) 01/12/2021  . Anemia, unspecified   . Arthritis   . Dementia   . Dementia (HCC) 04/24/2012  . Disorder of bone and cartilage, unspecified   . Dizziness and giddiness   . GERD (gastroesophageal reflux disease)   . Gout   . Gout, unspecified   . Hypertension   . Hypopotassemia   . Hypotension, unspecified   . Hypotension, unspecified   . Memory loss   . Memory loss   . Muscle weakness (generalized)   . Myalgia and myositis, unspecified   . Neuromuscular disorder (HCC)   . Other and unspecified hyperlipidemia   . Screening for ischemic heart disease   . Tobacco use disorder   . Type II or unspecified type diabetes mellitus without mention of complication, uncontrolled   . Type II or unspecified type diabetes mellitus without mention of complication, uncontrolled   . Unspecified hereditary and idiopathic peripheral neuropathy   . Unspecified vitamin D deficiency     Patient Active Problem List   Diagnosis Date Noted  . Hematuria 01/29/2021  . Leukocytosis 01/29/2021  . Thrombocytopenia (HCC) 01/29/2021  . HFrEF (heart failure with reduced ejection fraction) (HCC) 01/29/2021  . Pelvic pain 01/29/2021  . Acute on chronic combined systolic and diastolic CHF (congestive heart failure) (HCC) 01/12/2021  . Alcohol use 01/12/2021  . Acute on chronic combined systolic (congestive) and diastolic (congestive)  heart failure (HCC) 01/12/2021  . Acute hypoxemic respiratory failure due to COVID-19 (HCC) 12/24/2020  . Pulmonary embolism (HCC) 12/24/2020  . Tobacco use disorder   . Hypertension   . GERD (gastroesophageal reflux disease)   . Acute kidney failure, unspecified (HCC)   . 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 Empyema lung (HCC) 04/30/2015  . Loculated pleural effusion   .  Hyperglycemia   . Right lower lobe lung mass   . Lung mass 04/24/2015  . Lethargy   . Essential hypertension   . HLD (hyperlipidemia)   . Right-sided chest pain 04/20/2015  . Transaminitis 04/20/2015  . Positive D dimer 04/20/2015  . Acute respiratory failure with hypoxia (HCC) 04/20/2015  . Pleural effusion, right 04/20/2015  . Hypokalemia 04/20/2015  . Dementia without behavioral disturbance (HCC) 09/02/2014  . Type 2 diabetes mellitus with diabetic neuropathy (HCC) 05/20/2014  . Gout 04/29/2014  . Elevated liver enzymes 01/27/2014  . Anemia 05/02/2012  . Constipation 04/24/2012  . Dementia (HCC) 04/24/2012  . Arthritis   . HTN (hypertension) 04/13/2012  . Hyperlipidemia 04/13/2012    Past Surgical History:  Procedure Laterality Date  . CYSTOSCOPY N/A 02/01/2021   Procedure: CYSTOSCOPY WITH FULGERATION;  Surgeon: Jerilee FieldEskridge, Matthew, MD;  Location: [facility]S;  Service: Urology;  Laterality: N/A;  . INGUINAL HERNIA REPAIR    . VIDEO ASSISTED THORACOSCOPY (VATS)/EMPYEMA Right 04/30/2015   Procedure: VIDEO ASSISTED THORACOSCOPY (VATS)/DRAIN EMPYEMA;  Surgeon: Kerin PernaPeter Van Trigt, MD;  [facility];  Service: Thoracic;  Laterality: Right;  [name]. VIDEO BRONCHOSCOPY N/A 04/30/2015   Procedure: VIDEO BRONCHOSCOPY;  Surgeon: Kerin PernaPeter Van Trigt, MD;  [facility];  Service: Thoracic;  Laterality: N/A;       Family History  Problem Relation Age of Onset  . Diabetes Mother   . Hypertension Mother   . Diabetes Father   . Hypertension Father   . Kidney disease Father   . Diabetes Sister   . Kidney disease Brother     Social History   Tobacco Use  . Smoking status: Former Smoker    Quit date: 11/20/1969    Years since quitting: 51.2  . Smokeless tobacco: Never Used  Vaping Use  . Vaping Use: Never used  Substance Use Topics  . Alcohol use: Yes    Comment: daily- to every other day, combination or beer, wine, and liquor.per son patient drinks beer and liquor heavy  . Drug use: Not  Currently    Types: Marijuana    Home Medications Prior to Admission medications   Medication Sig Start Date End Date Taking? Authorizing Provider  albuterol (VENTOLIN HFA) 108 (90 Base) MCG/ACT inhaler Inhale 2 puffs into the lungs every 6 (six) hours. Patient taking differently: Inhale 2 puffs into the lungs every 6 (six) hours as needed (acute respiratory failure with hypoxia). 12/30/20   Ghimire, Werner LeanShanker M, MD  allopurinol (ZYLOPRIM) 300 MG tablet Take 300 mg by mouth in the morning.    [provider]  apixaban (ELIQUIS) 5 MG TABS tablet Take 1 tablet (5 mg total) by mouth 2 (two) times daily. Resume in 1-2 days 02/02/21   Albertine GratesXu, Fang, MD  atorvastatin (LIPITOR) 40 MG tablet Take 1 tablet (40 mg total) by mouth at bedtime. 12/30/20   Ghimire, Werner LeanShanker M, MD  benzonatate (TESSALON PERLES) 100 MG capsule Take 1 capsule (100 mg total) by mouth 3 (three) times daily as needed for cough. Patient taking differently: Take 100 mg by mouth every 8 (eight) hours as needed  for cough. 12/30/20 12/30/21  Ghimire, Werner Lean, MD  finasteride (PROSCAR) 5 MG tablet Take 1 tablet (5 mg total) by mouth daily. 02/01/21 02/01/22  Margette Fast, MD  folic acid (FOLVITE) 1 MG tablet Take 1 tablet (1 mg total) by mouth daily. 12/31/20   Ghimire, Werner Lean, MD  furosemide (LASIX) 40 MG tablet Take 1 tablet (40 mg total) by mouth daily. 01/18/21   Azucena Fallen, MD  insulin aspart (NOVOLOG) 100 UNIT/ML injection Inject 0-9 Units into the skin 3 (three) times daily with meals. Insulin sliding scale: Blood sugar  120-150   3units                       151-200   4units                       201-250   7units                       251- 300  11units                       301-350   15uints                       351-400   20units                       >400         call MD immediately 02/02/21   Albertine Grates, MD  losartan (COZAAR) 100 MG tablet Take 0.5 tablets (50 mg total) by mouth daily. 01/19/21   Azucena Fallen, MD  memantine (NAMENDA) 10 MG tablet Take 10 mg by mouth in the morning.    [provider]  Memantine HCl-Donepezil HCl (NAMZARIC) 28-10 MG CP24 Take 1 capsule by mouth daily. Patient not taking: No sig reported 06/10/19   [name], NP  metFORMIN (GLUCOPHAGE) 500 MG tablet TAKE 1 TABLET BY MOUTH EVERY DAY WITH BREAKFAST Patient taking differently: Take 500 mg by mouth in the morning. 06/10/19   [name], NP  metoprolol succinate (TOPROL-XL) 50 MG 24 hr tablet Take 1 tablet (50 mg total) by mouth daily. Take with or immediately following a meal. 12/31/20   Ghimire, Werner Lean, MD  Multiple Vitamin (MULTIVITAMIN WITH MINERALS) TABS tablet Take 1 tablet by mouth daily.    [provider]  OXYGEN Inhale 4 L into the lungs continuous.    [provider]  potassium chloride SA (KLOR-CON) 20 MEQ tablet Take 2 tablets (40 mEq total) by mouth daily. 02/02/21   Albertine Grates, MD  sitaGLIPtin (JANUVIA) 100 MG tablet Take 100 mg by mouth in the morning.    [provider]  tamsulosin (FLOMAX) 0.4 MG CAPS capsule Take 0.4 mg by mouth at bedtime.    [provider]  thiamine 100 MG tablet Take 1 tablet (100 mg total) by mouth daily. 12/31/20   Ghimire, Werner Lean, MD    Allergies    Penicillins  Review of Systems   Review of Systems  Unable to perform ROS: Dementia   Physical Exam Updated Vital Signs BP (!) 145/75   Pulse 74   Temp 98.2 F (36.8 C) (Oral)   Resp 15   Ht 6' (1.829 m)   Wt 70.3 kg   SpO2 99%   BMI 21.02 kg/m   Physical  Exam Vitals and nursing note reviewed.  HENT:     Head: Normocephalic and atraumatic.     Nose: Nose normal.     Mouth/Throat:     Mouth: Mucous membranes are moist.     Pharynx: Oropharynx is clear. Uvula midline. No oropharyngeal exudate or posterior oropharyngeal erythema.     Tonsils: No tonsillar exudate.  Eyes:     General: Lids are normal. Vision grossly intact.        Right eye: No  discharge.        Left eye: No discharge.     Extraocular Movements: Extraocular movements intact.     Conjunctiva/sclera: Conjunctivae normal.     Pupils: Pupils are equal, round, and reactive to light.  Neck:     Trachea: Trachea and phonation normal.  Cardiovascular:     Rate and Rhythm: Normal rate and regular rhythm.     Pulses: Normal pulses.     Heart sounds: Normal heart sounds. No murmur heard.   Pulmonary:     Effort: Pulmonary effort is normal. No respiratory distress.     Breath sounds: Normal breath sounds. No wheezing or rales.  Chest:     Chest wall: No deformity, swelling, tenderness, crepitus or edema.  Abdominal:     General: Bowel sounds are normal. There is distension.     Palpations: Abdomen is rigid.     Tenderness: There is abdominal tenderness in the periumbilical area and suprapubic area. There is no right CVA tenderness, left CVA tenderness, guarding or rebound.     Comments: Rigid, distended abdomen, firm to palpation in the suprapubic area.  Musculoskeletal:        General: No deformity.     Cervical back: Full passive range of motion without pain and neck supple. No crepitus. No pain with movement, spinous process tenderness or muscular tenderness.     Right lower leg: No edema.     Left lower leg: No edema.  Lymphadenopathy:     Cervical: No cervical adenopathy.  Skin:    General: Skin is warm and dry.     Capillary Refill: Capillary refill takes less than 2 seconds.  Neurological:     Mental Status: He is alert. Mental status is at baseline.     Sensory: Sensation is intact.     Motor: Motor function is intact.  Psychiatric:        Mood and Affect: Affect is labile.     ED Results / Procedures / Treatments   Labs (all labs ordered are listed, but only abnormal results are displayed) Labs Reviewed  BASIC METABOLIC PANEL - Abnormal; Notable for the following components:      Result Value   Potassium 3.3 (*)    Glucose, Bld 118 (*)     Calcium 8.6 (*)    All other components within normal limits  CBC WITH DIFFERENTIAL/PLATELET - Abnormal; Notable for the following components:   RBC 3.27 (*)    Hemoglobin 9.6 (*)    HCT 27.8 (*)    RDW 15.7 (*)    Platelets 120 (*)    Abs Immature Granulocytes 0.10 (*)    All other components within normal limits  URINALYSIS, ROUTINE W REFLEX MICROSCOPIC - Abnormal; Notable for the following components:   Hgb urine dipstick LARGE (*)    Protein, ur 30 (*)    RBC / HPF >50 (*)    Bacteria, UA RARE (*)    All other components within normal limits  URINE CULTURE    EKG EKG Interpretation  Date/Time:  Friday February 04 2021 10:35:25 EDT Ventricular Rate:  74 PR Interval:    QRS Duration: 173 QT Interval:  457 QTC Calculation: 508 R Axis:   -65 Text Interpretation: Sinus rhythm RBBB and LAFB Confirmed by Kristine Royal [id number]) on 02/04/2021 10:58:19 AM   Radiology No results found.  Procedures Procedures   Medications Ordered in ED Medications  haloperidol lactate (HALDOL) injection 2.5 mg (2.5 mg Intramuscular Given 02/04/21 0732)  haloperidol lactate (HALDOL) injection 2.5 mg (2.5 mg Intramuscular Given 02/04/21 1032)    ED Course  I have reviewed the triage vital signs and the nursing notes.  Pertinent labs & imaging results that were available during my care of the patient were reviewed by me and considered in my medical decision making (see chart for details).  Clinical Course as of 02/04/21 1252  Fri Feb 04, 2021  5462 Patient more calm after administration of haldol. Unfortunately, nursing staff unable to pass foley or coude catheter, urology consult placed.  [RS]  [phone] Consult call received back from urology, Dr. Annabell Howells, who is agreeable to seeing this patient. Will ask RN to call for OR urology cart to be brought to the bedside, at his request. I appreciate his collaboration in the care of this patient.  [RS]  1025 Urologist, Dr. Annabell Howells, at the bedside. Additional  dose of haldol ordered at his request.  [RS]  1112 Catheter placement successful, by urology MD. Patient placed in mittens to prevent him from pulling on catheter. Patient resting calmly at this time.  [RS]    Clinical Course User Index [RS] Sponseller, Idelia Salm   MDM Rules/Calculators/A&P                         80 year old male with baseline dementia and CAD on Eliquis who presents for urinary retention and refusal Foley at memory care center.  Patient agitated and combative at facility.  Vital signs are normal, cardiopulmonary exam is normal, abdominal exam revealed distended firm abdomen, suprapubic tenderness to palpation.  Concern for significant urinary retention.  Will need to replace Foley catheter to drain extremely distended bladder.  CBC with anemia, Hbg 9.6, stable from recent discharge from the hospital.  BMP with hypokalemia 3.3, UA with large hemoglobin, proteinuria rare bacteria.  Please see ED course above for further details the patient's ED course.  Urology placed new catheter with drainage of nearly 2 L of urine from the patient's bladder.  He endorses improvement in his abdominal pain and is requesting something to eat and drink.  He was observed for approximately 2 hours after placement of catheter with maintenance of his normal vital signs.   Given improvement of patient's abdominal pain, replacement of Foley, bladder decompression and reassuring blood work, no further work-up is warranted in the ED at this time.  Feel he is safe to be discharged home to his facility.  Emmitt voiced understanding of the need for his catheter, he did not have any questions for this provider.  Return precautions were clearly written in his discharge paperwork.  He is safe to be discharged back to his facility this time.  This chart was dictated using voice recognition software, Dragon. Despite the best efforts of this provider to proofread and correct errors, errors may still occur  which can change documentation meaning.  Final Clinical Impression(s) / ED Diagnoses Final diagnoses:  Urinary retention    Rx /  DC Orders ED Discharge Orders    None       Sherrilee Gilles 02/04/21 1252    Wynetta Fines, MD 02/05/21 2149

## 2021-02-04 NOTE — ED Notes (Addendum)
Urology cart at bedside 

## 2021-02-05 LAB — URINE CULTURE: Culture: NO GROWTH

## 2021-02-18 DEATH — deceased

## 2021-03-24 ENCOUNTER — Other Ambulatory Visit (HOSPITAL_COMMUNITY): Payer: Self-pay

## 2021-05-06 IMAGING — CT CT RENAL STONE PROTOCOL
2 of 4 series · 15 of 46 positions shown, 17 images · non-contrast
Comparison: 01/29/2021

CLINICAL DATA: Hematuria, blood clots in Foley catheter, right
inguinal pain

EXAM:
CT ABDOMEN AND PELVIS WITHOUT CONTRAST
TECHNIQUE: Multidetector CT imaging of the abdomen and pelvis was performed
following the standard protocol without IV contrast.

[Series 3: stone study 5.0 i30f 2 · axial · 0.84mm/px · z∈[+684,+1204]mm · 12 of 114 slices shown, 14 images]
[im 5/114  soft-tissue]
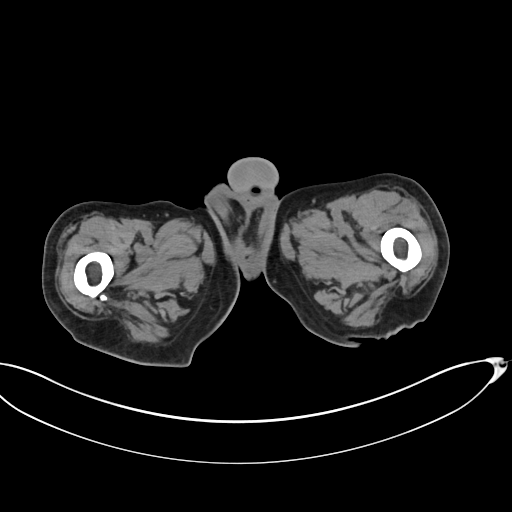
[im 5/114  bone]
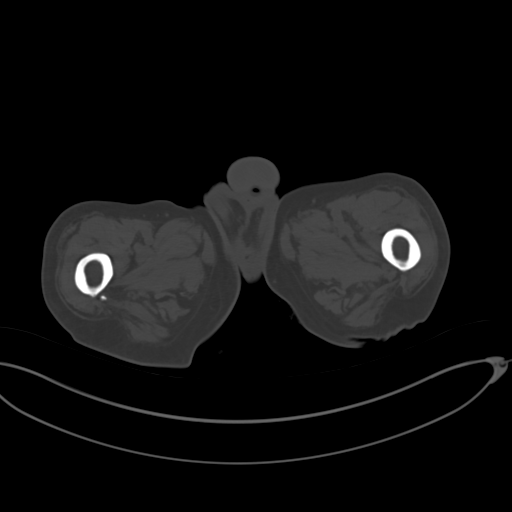
[im 15/114  soft-tissue]
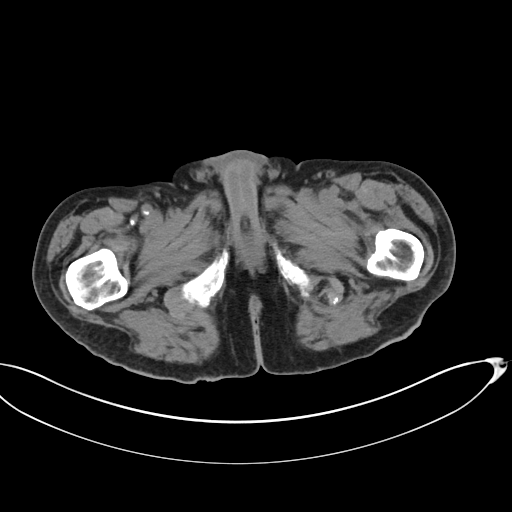
[im 24/114  soft-tissue]
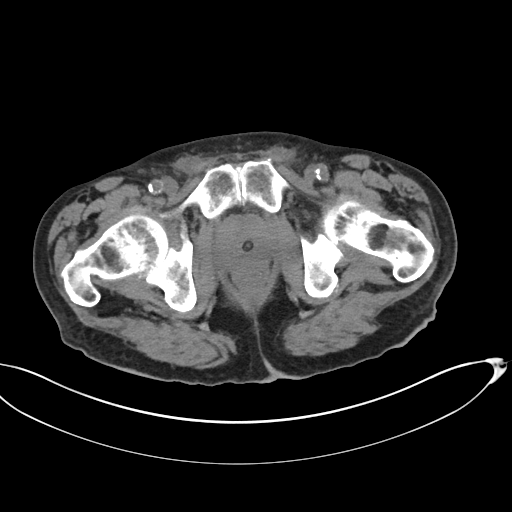
[im 33/114  soft-tissue]
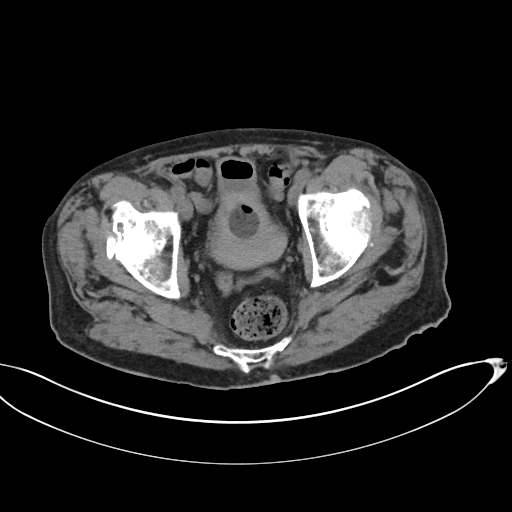
[im 43/114  soft-tissue]
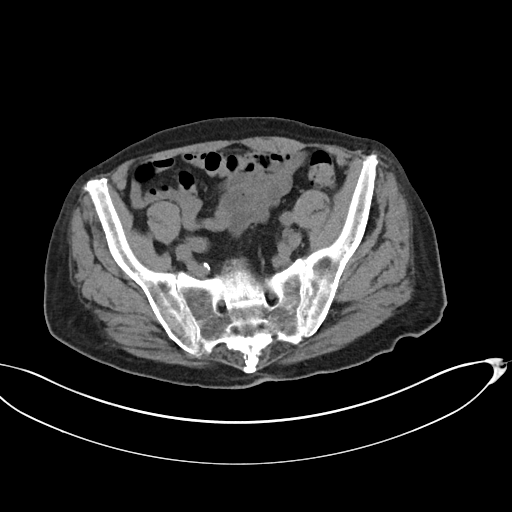
[im 52/114  soft-tissue]
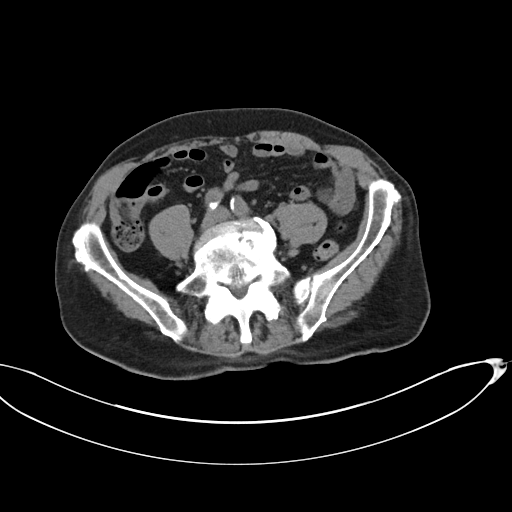
[im 62/114  soft-tissue]
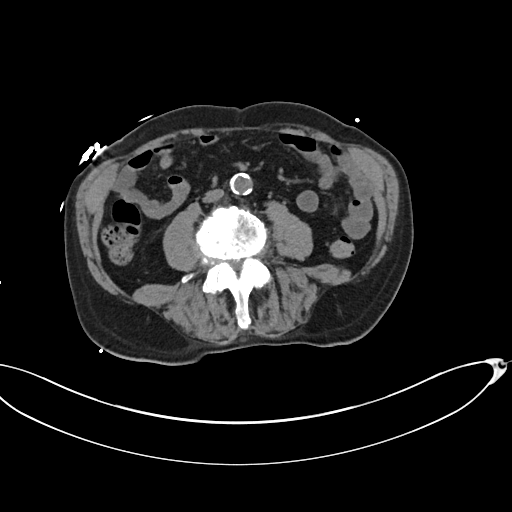
[im 71/114  soft-tissue]
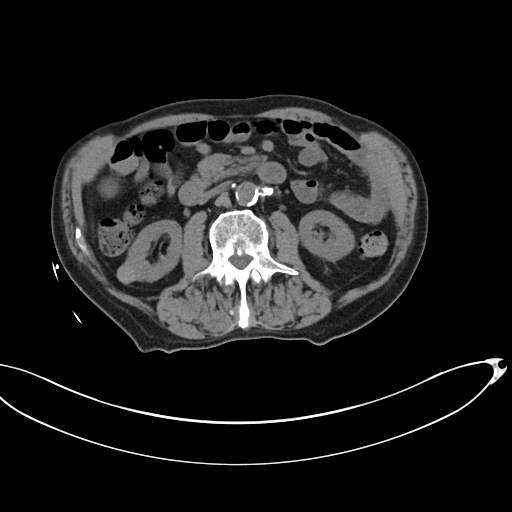
[im 81/114  soft-tissue]
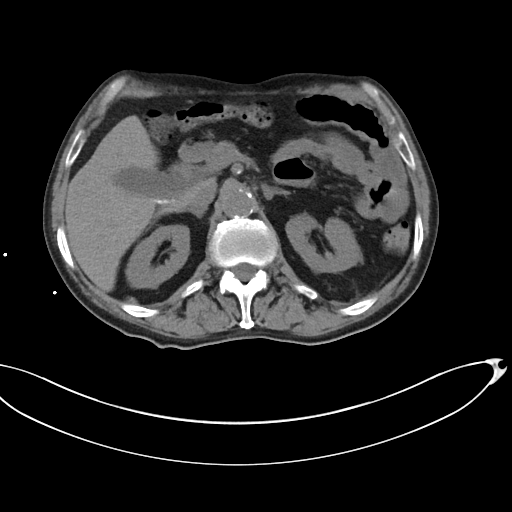
[im 81/114  bone]
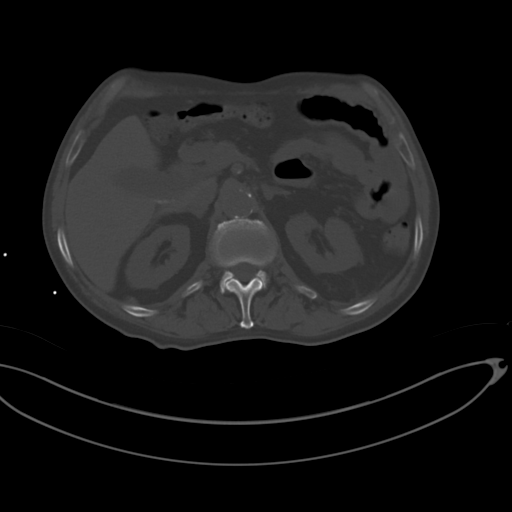
[im 90/114  soft-tissue]
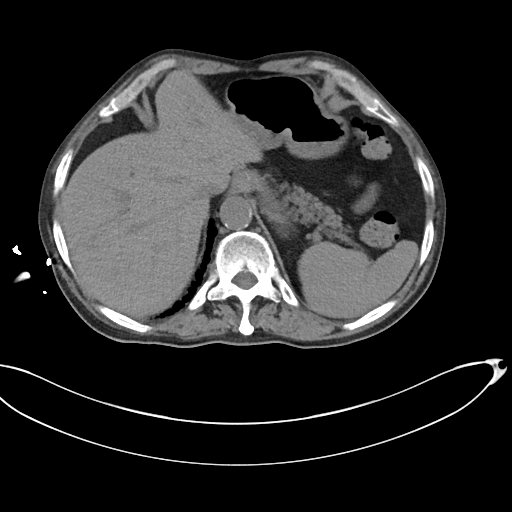
[im 99/114  soft-tissue]
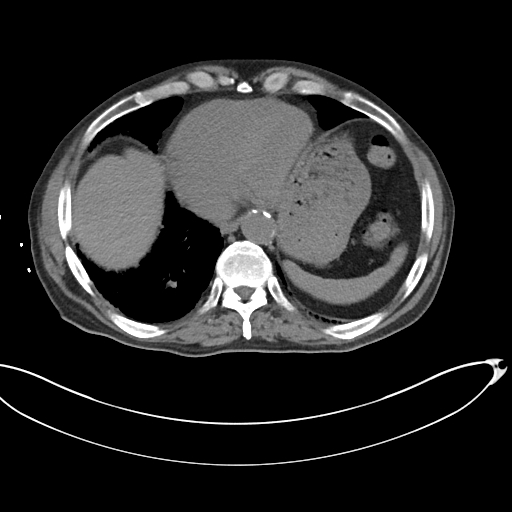
[im 109/114  soft-tissue]
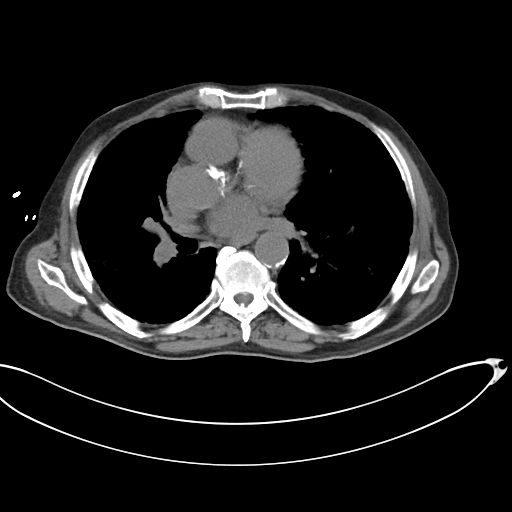

[Series 6: coronal soft tissue · coronal · 0.71mm/px · 3 of 92 slices shown]
[im 31/92  soft-tissue]
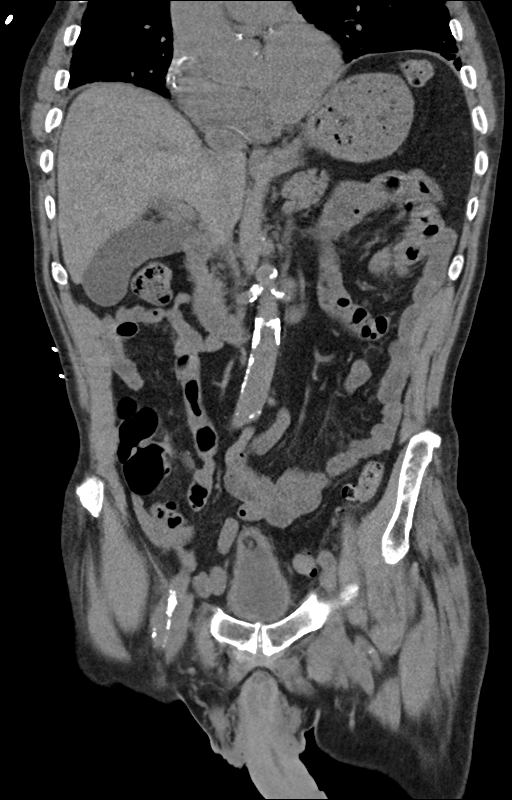
[im 41/92  soft-tissue]
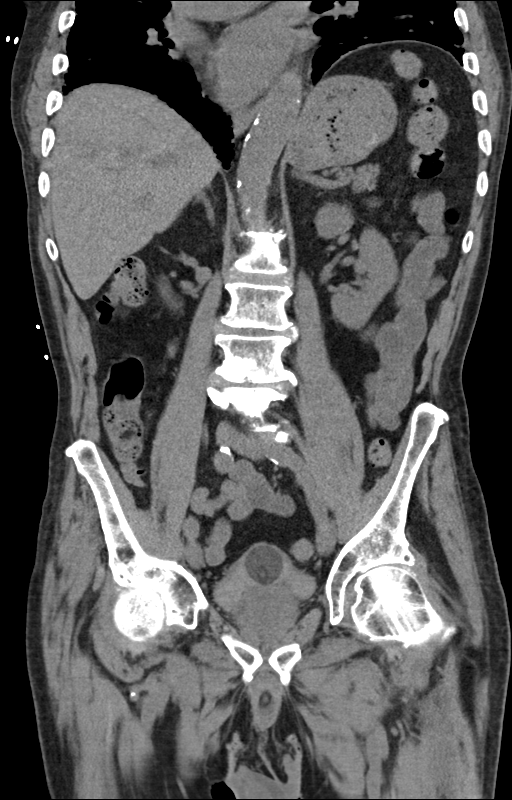
[im 51/92  soft-tissue]
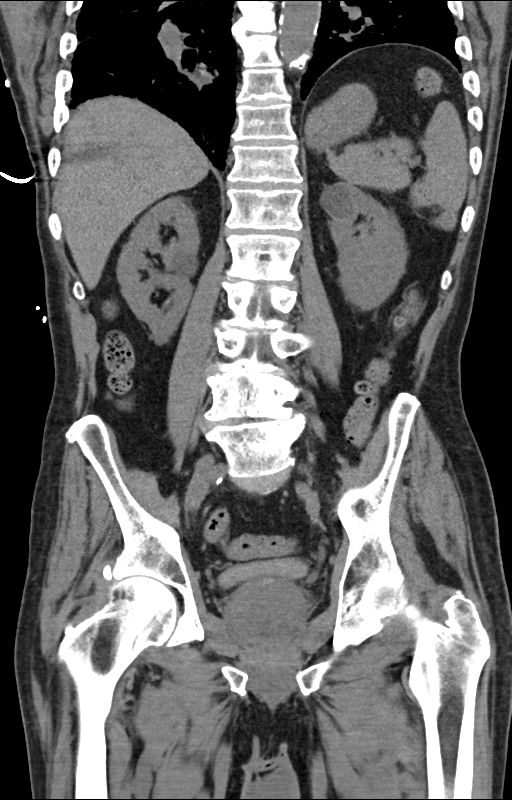

[15 of 46 positions shown; findings below may reference images not displayed]

FINDINGS: Lower chest: Diffuse fibrotic changes are seen throughout the lung
bases. No acute pleural or parenchymal lung disease.

Unenhanced CT was performed per clinician order. Lack of IV contrast
limits sensitivity and specificity, especially for evaluation of
abdominal/pelvic solid viscera.

Hepatobiliary: Calcified gallstones layer dependently within the
gallbladder. No evidence of acute cholecystitis. Unenhanced imaging
of the liver is unremarkable.

Pancreas: Unremarkable. No pancreatic ductal dilatation or
surrounding inflammatory changes.

Spleen: Normal in size without focal abnormality.

Adrenals/Urinary Tract: No evidence of urinary tract calculi or
obstructive uropathy. There are bilateral simple renal cysts, with a
1.5 cm hyperdense cyst lateral aspect right kidney. These were
visualized on preceding renal ultrasound. The adrenals are
unremarkable.

Foley catheter is seen within the urinary bladder. High attenuation
material within the bladder lumen consistent with blood products
seen on recent ultrasound.

Stomach/Bowel: No bowel obstruction or ileus. Normal appendix right
lower quadrant. Scattered diverticulosis of the distal colon without
diverticulitis. No bowel wall thickening or inflammatory change.

Vascular/Lymphatic: Aortic atherosclerosis. No enlarged abdominal or
pelvic lymph nodes.

Reproductive: Prostate is enlarged measuring 6.4 x 6.0 x 5.5 cm.
Limited visualization of the scrotum demonstrates a right-sided
hydrocele.

Other: There is no free fluid or free gas. No abdominal wall hernia.

Musculoskeletal: No acute or destructive bony lesions. Extensive
spondylosis and facet hypertrophy throughout the lumbar spine.
Reconstructed images demonstrate no additional findings.
IMPRESSION: 1. High attenuation material within the bladder lumen consistent
with blood products identified on recent ultrasound. Foley catheter
appears appropriately positioned.
2. Enlarged prostate.
3. Cholelithiasis without cholecystitis.
4. Right-sided hydrocele, partially imaged.
5. Bilateral simple and hyperdense renal cysts, evaluated on
preceding ultrasound.
6. Aortic Atherosclerosis (W64AU-W0J.J) and Emphysema ([id number]).
